# Patient Record
Sex: Female | Born: 1935 | Race: White | Hispanic: No | Marital: Married | Smoking: Never smoker
Health system: Southern US, Community
[De-identification: ages and names within clinical notes are randomized; demographics above are authoritative.]

## PROBLEM LIST (undated history)

## (undated) DIAGNOSIS — H3530 Unspecified macular degeneration: Secondary | ICD-10-CM

## (undated) DIAGNOSIS — I10 Essential (primary) hypertension: Secondary | ICD-10-CM

## (undated) DIAGNOSIS — I639 Cerebral infarction, unspecified: Secondary | ICD-10-CM

## (undated) DIAGNOSIS — M8580 Other specified disorders of bone density and structure, unspecified site: Secondary | ICD-10-CM

## (undated) HISTORY — DX: Essential (primary) hypertension: I10

## (undated) HISTORY — DX: Cerebral infarction, unspecified: I63.9

## (undated) HISTORY — DX: Other specified disorders of bone density and structure, unspecified site: M85.80

## (undated) HISTORY — PX: CATARACT EXTRACTION: SUR2

---

## 1997-04-23 ENCOUNTER — Encounter: Payer: Self-pay | Admitting: Internal Medicine

## 1998-04-16 ENCOUNTER — Ambulatory Visit (HOSPITAL_COMMUNITY): Admission: RE | Admit: 1998-04-16 | Discharge: 1998-04-16 | Payer: Self-pay | Admitting: *Deleted

## 1998-04-16 ENCOUNTER — Encounter: Payer: Self-pay | Admitting: *Deleted

## 1998-04-30 ENCOUNTER — Ambulatory Visit (HOSPITAL_COMMUNITY): Admission: RE | Admit: 1998-04-30 | Discharge: 1998-04-30 | Payer: Self-pay | Admitting: *Deleted

## 1998-05-14 ENCOUNTER — Ambulatory Visit (HOSPITAL_COMMUNITY): Admission: RE | Admit: 1998-05-14 | Discharge: 1998-05-14 | Payer: Self-pay | Admitting: *Deleted

## 1998-05-14 ENCOUNTER — Encounter: Payer: Self-pay | Admitting: *Deleted

## 1998-05-29 ENCOUNTER — Ambulatory Visit (HOSPITAL_COMMUNITY): Admission: RE | Admit: 1998-05-29 | Discharge: 1998-05-29 | Payer: Self-pay | Admitting: *Deleted

## 2002-01-18 ENCOUNTER — Encounter: Payer: Self-pay | Admitting: Internal Medicine

## 2004-04-17 ENCOUNTER — Encounter: Payer: Self-pay | Admitting: Internal Medicine

## 2004-09-11 ENCOUNTER — Ambulatory Visit: Payer: Self-pay | Admitting: Internal Medicine

## 2004-10-30 ENCOUNTER — Ambulatory Visit: Payer: Self-pay | Admitting: Internal Medicine

## 2004-12-11 ENCOUNTER — Ambulatory Visit: Payer: Self-pay | Admitting: Internal Medicine

## 2005-12-07 ENCOUNTER — Ambulatory Visit: Payer: Self-pay | Admitting: Internal Medicine

## 2005-12-23 ENCOUNTER — Encounter: Payer: Self-pay | Admitting: *Deleted

## 2006-03-05 ENCOUNTER — Ambulatory Visit: Payer: Self-pay | Admitting: Internal Medicine

## 2006-04-12 ENCOUNTER — Ambulatory Visit: Payer: Self-pay | Admitting: Internal Medicine

## 2006-05-26 ENCOUNTER — Encounter: Payer: Self-pay | Admitting: Internal Medicine

## 2006-06-03 ENCOUNTER — Ambulatory Visit: Payer: Self-pay | Admitting: Internal Medicine

## 2006-06-22 ENCOUNTER — Ambulatory Visit: Payer: Self-pay | Admitting: Internal Medicine

## 2006-06-22 DIAGNOSIS — I10 Essential (primary) hypertension: Secondary | ICD-10-CM | POA: Insufficient documentation

## 2006-06-22 DIAGNOSIS — M8580 Other specified disorders of bone density and structure, unspecified site: Secondary | ICD-10-CM

## 2006-06-22 DIAGNOSIS — M1711 Unilateral primary osteoarthritis, right knee: Secondary | ICD-10-CM | POA: Insufficient documentation

## 2006-06-22 DIAGNOSIS — R42 Dizziness and giddiness: Secondary | ICD-10-CM | POA: Insufficient documentation

## 2006-08-03 ENCOUNTER — Ambulatory Visit: Payer: Self-pay | Admitting: Internal Medicine

## 2006-12-14 ENCOUNTER — Ambulatory Visit: Payer: Self-pay | Admitting: Internal Medicine

## 2007-05-30 ENCOUNTER — Encounter: Payer: Self-pay | Admitting: Internal Medicine

## 2007-06-22 ENCOUNTER — Ambulatory Visit: Payer: Self-pay | Admitting: Internal Medicine

## 2007-06-24 LAB — CONVERTED CEMR LAB
BUN: 23 mg/dL (ref 6–23)
CO2: 32 meq/L (ref 19–32)
Calcium: 9.5 mg/dL (ref 8.4–10.5)
Chloride: 104 meq/L (ref 96–112)
Creatinine, Ser: 1.1 mg/dL (ref 0.4–1.2)
GFR calc Af Amer: 60 mL/min
GFR calc non Af Amer: 50 mL/min
Glucose, Bld: 112 mg/dL — ABNORMAL HIGH (ref 70–99)
Potassium: 3.7 meq/L (ref 3.5–5.1)
Sodium: 144 meq/L (ref 135–145)

## 2007-10-17 ENCOUNTER — Ambulatory Visit: Payer: Self-pay | Admitting: Internal Medicine

## 2007-11-14 ENCOUNTER — Ambulatory Visit: Payer: Self-pay | Admitting: Internal Medicine

## 2007-12-06 ENCOUNTER — Ambulatory Visit: Payer: Self-pay | Admitting: Internal Medicine

## 2007-12-22 ENCOUNTER — Ambulatory Visit: Payer: Self-pay | Admitting: Internal Medicine

## 2007-12-28 LAB — CONVERTED CEMR LAB
ALT: 18 units/L (ref 0–35)
AST: 21 units/L (ref 0–37)
Alkaline Phosphatase: 58 units/L (ref 39–117)
Basophils Absolute: 0 10*3/uL (ref 0.0–0.1)
Bilirubin, Direct: 0.2 mg/dL (ref 0.0–0.3)
CO2: 35 meq/L — ABNORMAL HIGH (ref 19–32)
Calcium: 9.3 mg/dL (ref 8.4–10.5)
Chloride: 100 meq/L (ref 96–112)
Glucose, Bld: 104 mg/dL — ABNORMAL HIGH (ref 70–99)
HDL: 54.6 mg/dL (ref >39.0)
Hemoglobin: 14.9 g/dL (ref 12.0–15.0)
Lymphocytes Relative: 59.3 % — ABNORMAL HIGH (ref 12.0–46.0)
Monocytes Relative: 7.4 % (ref 3.0–12.0)
Neutro Abs: 2.5 10*3/uL (ref 1.4–7.7)
Neutrophils Relative %: 30.9 % — ABNORMAL LOW (ref 43.0–77.0)
RBC: 4.84 M/uL (ref 3.87–5.11)
RDW: 13.2 % (ref 11.5–14.6)
Sodium: 140 meq/L (ref 135–145)
TSH: 1.89 microintl units/mL (ref 0.35–5.50)
Total Bilirubin: 1.1 mg/dL (ref 0.3–1.2)
Total CHOL/HDL Ratio: 3.9
Total Protein: 7 g/dL (ref 6.0–8.3)
VLDL: 23 mg/dL (ref 0–40)

## 2008-02-01 ENCOUNTER — Ambulatory Visit: Payer: Self-pay | Admitting: Internal Medicine

## 2008-02-01 LAB — CONVERTED CEMR LAB
Basophils Absolute: 0 10*3/uL (ref 0.0–0.1)
Eosinophils Absolute: 0.1 10*3/uL (ref 0.0–0.7)
HCT: 41.8 % (ref 36.0–46.0)
Hemoglobin: 14.7 g/dL (ref 12.0–15.0)
MCHC: 35.1 g/dL (ref 30.0–36.0)
MCV: 88.5 fL (ref 78.0–100.0)
Monocytes Absolute: 0.6 10*3/uL (ref 0.1–1.0)
Monocytes Relative: 6.5 % (ref 3.0–12.0)
Neutro Abs: 3.5 10*3/uL (ref 1.4–7.7)
Platelets: 158 10*3/uL (ref 150–400)
RDW: 13.3 % (ref 11.5–14.6)

## 2008-03-02 ENCOUNTER — Ambulatory Visit: Payer: Self-pay | Admitting: Internal Medicine

## 2008-03-05 LAB — CONVERTED CEMR LAB
Basophils Relative: 0.1 % (ref 0.0–1.0)
Eosinophils Absolute: 0.1 10*3/uL (ref 0.0–0.7)
Eosinophils Relative: 1.4 % (ref 0.0–5.0)
Lymphocytes Relative: 59.5 % — ABNORMAL HIGH (ref 12.0–46.0)
MCV: 90.7 fL (ref 78.0–100.0)
Neutrophils Relative %: 31.7 % — ABNORMAL LOW (ref 43.0–77.0)
Platelets: 162 10*3/uL (ref 150–400)
RBC: 4.86 M/uL (ref 3.87–5.11)
WBC: 10.6 10*3/uL — ABNORMAL HIGH (ref 4.5–10.5)

## 2008-03-06 ENCOUNTER — Other Ambulatory Visit: Admission: RE | Admit: 2008-03-06 | Discharge: 2008-03-06 | Payer: Self-pay | Admitting: Internal Medicine

## 2008-03-06 ENCOUNTER — Ambulatory Visit: Payer: Self-pay | Admitting: Internal Medicine

## 2008-03-06 ENCOUNTER — Encounter: Payer: Self-pay | Admitting: Internal Medicine

## 2008-03-09 ENCOUNTER — Telehealth: Payer: Self-pay | Admitting: Internal Medicine

## 2008-03-13 ENCOUNTER — Ambulatory Visit: Payer: Self-pay | Admitting: Internal Medicine

## 2008-03-14 ENCOUNTER — Encounter: Payer: Self-pay | Admitting: Internal Medicine

## 2008-03-14 LAB — CBC WITH DIFFERENTIAL/PLATELET
BASO%: 0.3 % (ref 0.0–2.0)
Eosinophils Absolute: 0.1 10*3/uL (ref 0.0–0.5)
HCT: 43.2 % (ref 34.8–46.6)
LYMPH%: 57.6 % — ABNORMAL HIGH (ref 14.0–48.0)
MONO#: 0.8 10*3/uL (ref 0.1–0.9)
NEUT#: 3.6 10*3/uL (ref 1.5–6.5)
NEUT%: 34 % — ABNORMAL LOW (ref 39.6–76.8)
Platelets: 153 10*3/uL (ref 145–400)
WBC: 10.6 10*3/uL — ABNORMAL HIGH (ref 3.9–10.0)
lymph#: 6.1 10*3/uL — ABNORMAL HIGH (ref 0.9–3.3)

## 2008-03-14 LAB — LACTATE DEHYDROGENASE: LDH: 168 U/L (ref 94–250)

## 2008-03-14 LAB — COMPREHENSIVE METABOLIC PANEL
ALT: 15 U/L (ref 0–35)
CO2: 28 mEq/L (ref 19–32)
Calcium: 9.6 mg/dL (ref 8.4–10.5)
Chloride: 103 mEq/L (ref 96–112)
Creatinine, Ser: 0.86 mg/dL (ref 0.40–1.20)
Glucose, Bld: 102 mg/dL — ABNORMAL HIGH (ref 70–99)
Sodium: 142 mEq/L (ref 135–145)
Total Bilirubin: 0.8 mg/dL (ref 0.3–1.2)
Total Protein: 7.1 g/dL (ref 6.0–8.3)

## 2008-05-02 ENCOUNTER — Ambulatory Visit: Payer: Self-pay | Admitting: Internal Medicine

## 2008-05-25 ENCOUNTER — Ambulatory Visit: Payer: Self-pay | Admitting: Internal Medicine

## 2008-05-30 ENCOUNTER — Encounter: Payer: Self-pay | Admitting: Internal Medicine

## 2008-06-12 ENCOUNTER — Ambulatory Visit: Payer: Self-pay | Admitting: Internal Medicine

## 2008-06-14 ENCOUNTER — Other Ambulatory Visit: Admission: RE | Admit: 2008-06-14 | Discharge: 2008-06-14 | Payer: Self-pay | Admitting: Internal Medicine

## 2008-06-14 ENCOUNTER — Encounter: Payer: Self-pay | Admitting: Internal Medicine

## 2008-06-14 LAB — CBC WITH DIFFERENTIAL/PLATELET
Basophils Absolute: 0 10*3/uL (ref 0.0–0.1)
EOS%: 1.4 % (ref 0.0–7.0)
HCT: 42.5 % (ref 34.8–46.6)
HGB: 14.4 g/dL (ref 11.6–15.9)
LYMPH%: 58.9 % — ABNORMAL HIGH (ref 14.0–48.0)
MCH: 30.3 pg (ref 26.0–34.0)
MCV: 89.4 fL (ref 81.0–101.0)
MONO%: 8.3 % (ref 0.0–13.0)
NEUT%: 31 % — ABNORMAL LOW (ref 39.6–76.8)
Platelets: 158 10*3/uL (ref 145–400)

## 2008-06-14 LAB — COMPREHENSIVE METABOLIC PANEL
Alkaline Phosphatase: 62 U/L (ref 39–117)
BUN: 30 mg/dL — ABNORMAL HIGH (ref 6–23)
Glucose, Bld: 132 mg/dL — ABNORMAL HIGH (ref 70–99)
Total Bilirubin: 0.7 mg/dL (ref 0.3–1.2)

## 2008-06-14 LAB — LACTATE DEHYDROGENASE: LDH: 175 U/L (ref 94–250)

## 2008-12-11 ENCOUNTER — Ambulatory Visit: Payer: Self-pay | Admitting: Internal Medicine

## 2008-12-13 ENCOUNTER — Encounter: Payer: Self-pay | Admitting: Internal Medicine

## 2008-12-13 LAB — CBC WITH DIFFERENTIAL/PLATELET
BASO%: 0.5 % (ref 0.0–2.0)
Basophils Absolute: 0 10e3/uL (ref 0.0–0.1)
EOS%: 1.3 % (ref 0.0–7.0)
Eosinophils Absolute: 0.1 10e3/uL (ref 0.0–0.5)
HCT: 43.9 % (ref 34.8–46.6)
HGB: 14.8 g/dL (ref 11.6–15.9)
LYMPH%: 54 % — ABNORMAL HIGH (ref 14.0–49.7)
MCH: 30.3 pg (ref 25.1–34.0)
MCHC: 33.8 g/dL (ref 31.5–36.0)
MCV: 89.6 fL (ref 79.5–101.0)
MONO#: 0.8 10e3/uL (ref 0.1–0.9)
MONO%: 7.9 % (ref 0.0–14.0)
NEUT#: 3.6 10e3/uL (ref 1.5–6.5)
NEUT%: 36.3 % — ABNORMAL LOW (ref 38.4–76.8)
Platelets: 157 10e3/uL (ref 145–400)
RBC: 4.9 10e6/uL (ref 3.70–5.45)
RDW: 14.5 % (ref 11.2–14.5)
WBC: 9.9 10e3/uL (ref 3.9–10.3)
lymph#: 5.4 10e3/uL — ABNORMAL HIGH (ref 0.9–3.3)

## 2008-12-20 ENCOUNTER — Ambulatory Visit: Payer: Self-pay | Admitting: Internal Medicine

## 2009-01-22 ENCOUNTER — Telehealth: Payer: Self-pay | Admitting: Internal Medicine

## 2009-01-22 ENCOUNTER — Ambulatory Visit: Payer: Self-pay | Admitting: Internal Medicine

## 2009-04-24 ENCOUNTER — Ambulatory Visit: Payer: Self-pay | Admitting: Internal Medicine

## 2009-04-25 LAB — CONVERTED CEMR LAB
BUN: 24 mg/dL — ABNORMAL HIGH (ref 6–23)
Basophils Relative: 0.2 % (ref 0.0–3.0)
CO2: 33 meq/L — ABNORMAL HIGH (ref 19–32)
Calcium: 9.6 mg/dL (ref 8.4–10.5)
Chloride: 105 meq/L (ref 96–112)
Creatinine, Ser: 0.8 mg/dL (ref 0.4–1.2)
Direct LDL: 125.1 mg/dL
Eosinophils Absolute: 0.1 10*3/uL (ref 0.0–0.7)
HCT: 42.3 % (ref 36.0–46.0)
Hemoglobin: 14.6 g/dL (ref 12.0–15.0)
Lymphocytes Relative: 53 % — ABNORMAL HIGH (ref 12.0–46.0)
Lymphs Abs: 4.5 10*3/uL — ABNORMAL HIGH (ref 0.7–4.0)
MCHC: 34.5 g/dL (ref 30.0–36.0)
Monocytes Relative: 9 % (ref 3.0–12.0)
Neutro Abs: 3 10*3/uL (ref 1.4–7.7)
RBC: 4.62 M/uL (ref 3.87–5.11)
Total CHOL/HDL Ratio: 4

## 2009-05-02 ENCOUNTER — Encounter: Payer: Self-pay | Admitting: Internal Medicine

## 2009-05-02 ENCOUNTER — Ambulatory Visit: Payer: Self-pay | Admitting: Internal Medicine

## 2009-05-02 LAB — HM DEXA SCAN

## 2009-06-04 ENCOUNTER — Ambulatory Visit: Payer: Self-pay | Admitting: Internal Medicine

## 2009-06-05 ENCOUNTER — Encounter (INDEPENDENT_AMBULATORY_CARE_PROVIDER_SITE_OTHER): Payer: Self-pay | Admitting: *Deleted

## 2009-06-10 ENCOUNTER — Ambulatory Visit: Payer: Self-pay | Admitting: Internal Medicine

## 2009-06-12 ENCOUNTER — Encounter (INDEPENDENT_AMBULATORY_CARE_PROVIDER_SITE_OTHER): Payer: Self-pay | Admitting: *Deleted

## 2009-06-12 LAB — CBC WITH DIFFERENTIAL/PLATELET
Basophils Absolute: 0 10*3/uL (ref 0.0–0.1)
Eosinophils Absolute: 0.1 10*3/uL (ref 0.0–0.5)
HGB: 14.3 g/dL (ref 11.6–15.9)
LYMPH%: 53.2 % — ABNORMAL HIGH (ref 14.0–49.7)
MCV: 90.6 fL (ref 79.5–101.0)
MONO%: 8 % (ref 0.0–14.0)
NEUT#: 2.7 10*3/uL (ref 1.5–6.5)
Platelets: 156 10*3/uL (ref 145–400)

## 2009-07-31 ENCOUNTER — Ambulatory Visit: Payer: Self-pay | Admitting: Internal Medicine

## 2009-08-07 ENCOUNTER — Telehealth: Payer: Self-pay | Admitting: Internal Medicine

## 2009-09-02 ENCOUNTER — Ambulatory Visit: Payer: Self-pay | Admitting: Internal Medicine

## 2009-10-28 ENCOUNTER — Ambulatory Visit: Payer: Self-pay | Admitting: Internal Medicine

## 2009-10-29 LAB — CONVERTED CEMR LAB
Chloride: 106 meq/L (ref 96–112)
Potassium: 4.4 meq/L (ref 3.5–5.1)
Sodium: 143 meq/L (ref 135–145)

## 2009-12-13 ENCOUNTER — Ambulatory Visit: Payer: Self-pay | Admitting: Internal Medicine

## 2009-12-16 ENCOUNTER — Encounter: Payer: Self-pay | Admitting: Internal Medicine

## 2009-12-16 LAB — CBC WITH DIFFERENTIAL/PLATELET
Basophils Absolute: 0 10*3/uL (ref 0.0–0.1)
EOS%: 1.3 % (ref 0.0–7.0)
HCT: 41.2 % (ref 34.8–46.6)
HGB: 14 g/dL (ref 11.6–15.9)
LYMPH%: 29 % (ref 14.0–49.7)
MCH: 31.2 pg (ref 25.1–34.0)
MCV: 91.6 fL (ref 79.5–101.0)
NEUT%: 62 % (ref 38.4–76.8)
Platelets: 163 10*3/uL (ref 145–400)
lymph#: 2.5 10*3/uL (ref 0.9–3.3)

## 2010-01-01 ENCOUNTER — Ambulatory Visit: Payer: Self-pay | Admitting: Internal Medicine

## 2010-05-05 ENCOUNTER — Ambulatory Visit: Payer: Self-pay | Admitting: Internal Medicine

## 2010-05-05 LAB — CONVERTED CEMR LAB
CO2: 28 meq/L (ref 19–32)
Chloride: 105 meq/L (ref 96–112)
GFR calc non Af Amer: 64.93 mL/min (ref >60)
Glucose, Bld: 103 mg/dL — ABNORMAL HIGH (ref 70–99)
Potassium: 3.8 meq/L (ref 3.5–5.1)
Sodium: 142 meq/L (ref 135–145)

## 2010-06-09 ENCOUNTER — Encounter: Payer: Self-pay | Admitting: Internal Medicine

## 2010-06-11 ENCOUNTER — Ambulatory Visit: Payer: Self-pay | Admitting: Internal Medicine

## 2010-06-11 DIAGNOSIS — M4802 Spinal stenosis, cervical region: Secondary | ICD-10-CM | POA: Insufficient documentation

## 2010-06-13 ENCOUNTER — Telehealth: Payer: Self-pay | Admitting: Internal Medicine

## 2010-06-16 ENCOUNTER — Ambulatory Visit: Payer: Self-pay | Admitting: Internal Medicine

## 2010-06-23 ENCOUNTER — Encounter: Admission: RE | Admit: 2010-06-23 | Discharge: 2010-06-23 | Payer: Self-pay | Admitting: Internal Medicine

## 2010-06-27 ENCOUNTER — Ambulatory Visit: Payer: Self-pay | Admitting: Internal Medicine

## 2010-06-30 ENCOUNTER — Encounter: Payer: Self-pay | Admitting: Internal Medicine

## 2010-07-09 ENCOUNTER — Ambulatory Visit: Payer: Self-pay | Admitting: Internal Medicine

## 2010-07-10 ENCOUNTER — Encounter: Admission: RE | Admit: 2010-07-10 | Discharge: 2010-07-10 | Payer: Self-pay | Admitting: Neurosurgery

## 2010-07-22 ENCOUNTER — Encounter: Admission: RE | Admit: 2010-07-22 | Discharge: 2010-07-22 | Payer: Self-pay | Admitting: Neurosurgery

## 2010-08-06 ENCOUNTER — Ambulatory Visit: Payer: Self-pay | Admitting: Internal Medicine

## 2010-08-14 ENCOUNTER — Telehealth: Payer: Self-pay | Admitting: Internal Medicine

## 2010-08-20 ENCOUNTER — Encounter
Admission: RE | Admit: 2010-08-20 | Discharge: 2010-08-20 | Payer: Self-pay | Source: Home / Self Care | Attending: Neurosurgery | Admitting: Neurosurgery

## 2010-09-17 ENCOUNTER — Ambulatory Visit
Admission: RE | Admit: 2010-09-17 | Discharge: 2010-09-17 | Payer: Self-pay | Source: Home / Self Care | Attending: Internal Medicine | Admitting: Internal Medicine

## 2010-09-25 NOTE — Progress Notes (Signed)
----   Converted from flag ---- ---- 08/06/2010 10:30 AM, Birdie Sons MD wrote: Call Dr. Cassandria Santee nurse: see if he should f/u with her. Steroid injections have helped. ------------------------------  Spoke with Dr Cassandria Santee secretary and she said that pt needs to call [facility] and have them fax order.  Called pt and gave her information and she was confused so I just told her to call Dr Cassandria Santee office.

## 2010-09-25 NOTE — Assessment & Plan Note (Signed)
Summary: discuss MRI results/cdw   Vital Signs:  Patient profile:   77 year old female Weight:      141 pounds Temp:     98.1 degrees F oral Pulse rate:   88 / minute Pulse rhythm:   regular BP sitting:   134 / 82  (left arm) Cuff size:   regular CC: discuss MRI results   CC:  discuss MRI results.  Current Medications (verified): 1)  Amlodipine Besylate 5 Mg  Tabs (Amlodipine Besylate) .Marland Kitchen.. 1 By Mouth Every Day 2)  Fish Oil 1000 Mg Caps (Omega-3 Fatty Acids) 3)  Losartan Potassium-Hctz 100-25 Mg Tabs (Losartan Potassium-Hctz) .... Take 1 Tablet By Mouth Once A Day 4)  Multivitamins  Tabs (Multiple Vitamin) .... Once Daily 5)  Labetalol Hcl 200 Mg Tabs (Labetalol Hcl) .Marland Kitchen.. 1 Tab Twice Daily 6)  Hydrocodone-Acetaminophen 5-325 Mg Tabs (Hydrocodone-Acetaminophen) .Marland Kitchen.. 1 By Mouth Up To 4 Times Per Day As Needed For Pain  Allergies (verified): 1)  ! Ace Inhibitors   Impression & Recommendations:  Problem # 1:  SPINAL STENOSIS, CERVICAL (ICD-723.0) I had a long discussion with the patient and her husband today. She has significant spinal stenosis. I spoke with interventional radiology. I suggested neurosurgery consult rather than immediate epidural steroid injection. We will do that. I'll set up neurosurgery evaluation. Discussion 30 minutes over half face-to-face talking with patient and husband.  Complete Medication List: 1)  Amlodipine Besylate 5 Mg Tabs (Amlodipine besylate) .Marland Kitchen.. 1 by mouth every day 2)  Fish Oil 1000 Mg Caps (Omega-3 fatty acids) 3)  Losartan Potassium-hctz 100-25 Mg Tabs (Losartan potassium-hctz) .... Take 1 tablet by mouth once a day 4)  Multivitamins Tabs (Multiple vitamin) .... Once daily 5)  Labetalol Hcl 200 Mg Tabs (Labetalol hcl) .Marland Kitchen.. 1 tab twice daily 6)  Hydrocodone-acetaminophen 5-325 Mg Tabs (Hydrocodone-acetaminophen) .Marland Kitchen.. 1 by mouth up to 4 times per day as needed for pain Prescriptions: HYDROCODONE-ACETAMINOPHEN 5-325 MG TABS  (HYDROCODONE-ACETAMINOPHEN) 1 by mouth up to 4 times per day as needed for pain  #60 x 1   Entered and Authorized by:   Birdie Sons MD   Signed by:   Birdie Sons MD on 06/27/2010   Method used:   Print then Give to Patient   RxID:   [id number] AMLODIPINE BESYLATE 5 MG  TABS (AMLODIPINE BESYLATE) 1 by mouth every day  #90 x 3   Entered and Authorized by:   Birdie Sons MD   Signed by:   Birdie Sons MD on 06/27/2010   Method used:   [facility] to        [phone]       [address]       [place], [place]  [address]       Ph: [phone] or [phone]       Fax: [phone]   RxID:   [id number]    Orders Added: 1)  Est. Patient Level IV [33295]  Appended Document: Orders Update    Clinical Lists Changes  Orders: Added new Referral order of Neurosurgeon Referral (Neurosurgeon) - Signed

## 2010-09-25 NOTE — Assessment & Plan Note (Signed)
Summary: ONE MTH ROV // RS   Vital Signs:  Patient profile:   77 year old female Weight:      148 pounds Temp:     98.2 degrees F Pulse rate:   68 / minute Resp:     12 per minute BP sitting:   136 / 74  (left arm)  Vitals Entered By: Gladis Riffle, RN (September 02, 2009 11:03 AM)   History of Present Illness:  Follow-Up Visit      This is an 77 year old woman who presents for Follow-up visit.  The patient denies chest pain, palpitations, dizziness, syncope, edema, SOB, DOE, PND, and orthopnea.  Since the last visit the patient notes no new problems or concerns.  The patient reports taking meds as prescribed and monitoring BP.  When questioned about possible medication side effects, the patient notes none.    past week her home bps have been in 125-135/70 range  All other systems reviewed and were negative   Preventive Screening-Counseling & Management  Alcohol-Tobacco     Smoking Status: never  Allergies: 1)  ! Ace Inhibitors  Comments:  Nurse/Medical Assistant: 1 month rov--BP 116/75-153/76 at home  The patient's medications and allergies were reviewed with the patient and were updated in the Medication and Allergy Lists. Gladis Riffle, RN (September 02, 2009 11:03 AM)  Past History:  Past Medical History: Last updated: 06/22/2006 Hypertension Osteopenia  Past Surgical History: Last updated: 12/20/2008  Cataract extraction  Family History: Last updated: 06/22/2007 Family Hsitory Breast cancer 1st degree relative <50-dtr  Social History: Last updated: 06/22/2006 Married Never Smoked  Risk Factors: Smoking Status: never (09/02/2009)  Review of Systems       All other systems reviewed and were negative   Physical Exam  General:  Well-developed,well-nourished,in no acute distress; alert,appropriate and cooperative throughout examination Neck:  No deformities, masses, or tenderness noted. Chest Wall:  No deformities, masses, or tenderness noted. Lungs:   Normal respiratory effort, chest expands symmetrically. Lungs are clear to auscultation, no crackles or wheezes. Heart:  Normal rate and regular rhythm. S1 and S2 normal without gallop, murmur, click, rub or other extra sounds.   Impression & Recommendations:  Problem # 1:  HYPERTENSION (ICD-401.9) Assessment Improved improved continue current medications  Her updated medication list for this problem includes:    Amlodipine Besylate 5 Mg Tabs (Amlodipine besylate) .Marland Kitchen... 1 by mouth every day    Losartan Potassium-hctz 100-25 Mg Tabs (Losartan potassium-hctz) .Marland Kitchen... Take 1 tablet by mouth once a day    Labetalol Hcl 200 Mg Tabs (Labetalol hcl) .Marland Kitchen... 1 tab twice daily  BP today: 136/74 Prior BP: 158/78 (07/31/2009)  Prior 10 Yr Risk Heart Disease: Not enough information (08/03/2006)  Labs Reviewed: K+: 4.5 (04/24/2009) Creat: : 0.8 (04/24/2009)   Chol: 203 (04/24/2009)   HDL: 55.40 (04/24/2009)   LDL: DEL (12/22/2007)   TG: 133.0 (04/24/2009)  Complete Medication List: 1)  Amlodipine Besylate 5 Mg Tabs (Amlodipine besylate) .Marland Kitchen.. 1 by mouth every day 2)  Tylenol 325 Mg Tabs (Acetaminophen) .... Once daily as needed 3)  Fish Oil 1000 Mg Caps (Omega-3 fatty acids) 4)  Losartan Potassium-hctz 100-25 Mg Tabs (Losartan potassium-hctz) .... Take 1 tablet by mouth once a day 5)  Multivitamins Tabs (Multiple vitamin) .... Once daily 6)  Labetalol Hcl 200 Mg Tabs (Labetalol hcl) .Marland Kitchen.. 1 tab twice daily  Patient Instructions: 1)  Please schedule a follow-up appointment in 2 months.

## 2010-09-25 NOTE — Assessment & Plan Note (Signed)
Summary: 4 week fup//ccm   Vital Signs:  Patient profile:   77 year old female Weight:      140 pounds Temp:     98.4 degrees F oral BP sitting:   122 / 66  (left arm) Cuff size:   regular  Vitals Entered By: Alfred Levins, CMA (August 06, 2010 9:48 AM) CC: f/u   CC:  f/u.  History of Present Illness: neck and shoulder pain much better--- has had epidural cervical ESI  she complains of an intentional tremor---has changed the way she writes her name  All other systems reviewed and were negative except chronic aches and pains   Current Medications (verified): 1)  Amlodipine Besylate 5 Mg  Tabs (Amlodipine Besylate) .Marland Kitchen.. 1 By Mouth Every Day 2)  Fish Oil 1000 Mg Caps (Omega-3 Fatty Acids) 3)  Losartan Potassium-Hctz 100-25 Mg Tabs (Losartan Potassium-Hctz) .... Take 1 Tablet By Mouth Once A Day 4)  Multivitamins  Tabs (Multiple Vitamin) .... Once Daily 5)  Labetalol Hcl 200 Mg Tabs (Labetalol Hcl) .Marland Kitchen.. 1 Tab Twice Daily 6)  Hydrocodone-Acetaminophen 5-325 Mg Tabs (Hydrocodone-Acetaminophen) .Marland Kitchen.. 1 By Mouth Up To 4 Times Per Day As Needed For Pain  Allergies (verified): 1)  ! Ace Inhibitors  Physical Exam  General:  early female in no acute distress. HEENT exam atraumatic, normocephalic, neck supple. Chest clear to auscultation cardiac exam S1-S2 are regular   Impression & Recommendations:  Problem # 1:  SPINAL STENOSIS, CERVICAL (ICD-723.0) much improved I'll call Dr. Levada Dy if f/u necessary  Problem # 2:  HYPERTENSION (ICD-401.9) continue current medications  monitor BP at home Her updated medication list for this problem includes:    Amlodipine Besylate 5 Mg Tabs (Amlodipine besylate) .Marland Kitchen... 1 by mouth every day    Losartan Potassium-hctz 100-25 Mg Tabs (Losartan potassium-hctz) .Marland Kitchen... Take 1 tablet by mouth once a day    Labetalol Hcl 200 Mg Tabs (Labetalol hcl) .Marland Kitchen... 1 tab twice daily  Complete Medication List: 1)  Amlodipine Besylate 5 Mg Tabs  (Amlodipine besylate) .Marland Kitchen.. 1 by mouth every day 2)  Fish Oil 1000 Mg Caps (Omega-3 fatty acids) 3)  Losartan Potassium-hctz 100-25 Mg Tabs (Losartan potassium-hctz) .... Take 1 tablet by mouth once a day 4)  Multivitamins Tabs (Multiple vitamin) .... Once daily 5)  Labetalol Hcl 200 Mg Tabs (Labetalol hcl) .Marland Kitchen.. 1 tab twice daily 6)  Hydrocodone-acetaminophen 5-325 Mg Tabs (Hydrocodone-acetaminophen) .Marland Kitchen.. 1 by mouth up to 4 times per day as needed for pain  Patient Instructions: 1)  see me 6 weeks   Orders Added: 1)  Est. Patient Level III [04540]

## 2010-09-25 NOTE — Progress Notes (Signed)
Summary: pain killing her  Phone Note Call from Patient   Caller: Patient Call For: Birdie Sons MD Summary of Call: Shoulder is 'a little" better since injection.  Should she do anyting else for her pain? Initial call taken by: [facility] CMA AAMA,  June 13, 2010 12:12 PM  Follow-up for Phone Call        let's see what happens over the next few days. Call me next week if symptoms persist. Follow-up by: Birdie Sons MD,  June 14, 2010 5:08 PM  Additional Follow-up for Phone Call Additional follow up Details #1::        Pain in right arm is killing her & she's going for the xray you advised today at [facility].   Additional Follow-up by: Rudy Jew, RN,  June 16, 2010 8:48 AM

## 2010-09-25 NOTE — Letter (Signed)
[facility]   Imported By: Maryln Gottron 01/14/2010 13:45:14  _____________________________________________________________________  External Attachment:    Type:   Image     Comment:   External Document

## 2010-09-25 NOTE — Assessment & Plan Note (Signed)
Summary: pt confused about ref/cdw   Vital Signs:  Patient profile:   77 year old female Weight:      141 pounds Temp:     98.5 degrees F oral BP sitting:   124 / 68  (left arm) Cuff size:   regular  Vitals Entered By: Alfred Levins, CMA (July 09, 2010 10:01 AM) CC: discuss referral   CC:  discuss referral.  Current Medications (verified): 1)  Amlodipine Besylate 5 Mg  Tabs (Amlodipine Besylate) .Marland Kitchen.. 1 By Mouth Every Day 2)  Fish Oil 1000 Mg Caps (Omega-3 Fatty Acids) 3)  Losartan Potassium-Hctz 100-25 Mg Tabs (Losartan Potassium-Hctz) .... Take 1 Tablet By Mouth Once A Day 4)  Multivitamins  Tabs (Multiple Vitamin) .... Once Daily 5)  Labetalol Hcl 200 Mg Tabs (Labetalol Hcl) .Marland Kitchen.. 1 Tab Twice Daily 6)  Hydrocodone-Acetaminophen 5-325 Mg Tabs (Hydrocodone-Acetaminophen) .Marland Kitchen.. 1 By Mouth Up To 4 Times Per Day As Needed For Pain  Allergies (verified): 1)  ! Ace Inhibitors   Impression & Recommendations:  Problem # 1:  SPINAL STENOSIS, CERVICAL (ICD-723.0) scheduled for injections tomorrow. to pursue injections by interventional radiology. Patient and wife understand. Total time face-to-face 15 minutes Patient comes in with husband to discuss recent MRI, neurosurgical referral in interventional radiology referral. I have discussed the results of the MRI and neurosurgical impression. Best choice at this time is pursue injections by interventional radiology. This was discussed with patient and husband. Total time face-to-face greater than 15 minutes all counseling.  Complete Medication List: 1)  Amlodipine Besylate 5 Mg Tabs (Amlodipine besylate) .Marland Kitchen.. 1 by mouth every day 2)  Fish Oil 1000 Mg Caps (Omega-3 fatty acids) 3)  Losartan Potassium-hctz 100-25 Mg Tabs (Losartan potassium-hctz) .... Take 1 tablet by mouth once a day 4)  Multivitamins Tabs (Multiple vitamin) .... Once daily 5)  Labetalol Hcl 200 Mg Tabs (Labetalol hcl) .Marland Kitchen.. 1 tab twice daily 6)  Hydrocodone-acetaminophen  5-325 Mg Tabs (Hydrocodone-acetaminophen) .Marland Kitchen.. 1 by mouth up to 4 times per day as needed for pain   Orders Added: 1)  Est. Patient Level III [47829]

## 2010-09-25 NOTE — Assessment & Plan Note (Signed)
Summary: 6 MONTH ROV/NJR   Vital Signs:  Patient profile:   77 year old female Weight:      143 pounds Temp:     98.7 degrees F oral BP sitting:   138 / 78  (left arm) Cuff size:   regular  Vitals Entered By: Kern Reap CMA Duncan Dull) (May 05, 2010 11:04 AM) CC: follow-up visit, arthritis   CC:  follow-up visit and arthritis.  History of Present Illness:  Follow-Up Visit      This is a 77 year old woman who presents for Follow-up visit.  The patient denies chest pain and palpitations.  Since the last visit the patient notes no new problems or concerns , except one week hx of right shoulder pain---some relief with hot shower and Crista Elliot.   The patient reports taking meds as prescribed.  When questioned about possible medication side effects, the patient notes none.    no CP, SOB< PND, orhtopnea or any other complaints.   All other systems reviewed and were negative   Current Problems (verified): 1)  Preventive Health Care  (ICD-V70.0) 2)  Arthritis, Right Knee  (ICD-716.96) 3)  Vertigo  (ICD-780.4) 4)  Osteopenia  (ICD-733.90) 5)  Hypertension  (ICD-401.9)  Current Medications (verified): 1)  Amlodipine Besylate 5 Mg  Tabs (Amlodipine Besylate) .Marland Kitchen.. 1 By Mouth Every Day 2)  Tylenol Extra Strength 500 Mg Tabs (Acetaminophen) .... Take One Qid X 5 Days, Then May Return To As Needed 3)  Fish Oil 1000 Mg Caps (Omega-3 Fatty Acids) 4)  Losartan Potassium-Hctz 100-25 Mg Tabs (Losartan Potassium-Hctz) .... Take 1 Tablet By Mouth Once A Day 5)  Multivitamins  Tabs (Multiple Vitamin) .... Once Daily 6)  Labetalol Hcl 200 Mg Tabs (Labetalol Hcl) .Marland Kitchen.. 1 Tab Twice Daily  Allergies (verified): 1)  ! Ace Inhibitors  Review of Systems       Flu Vaccine Consent Questions     Do you have a history of severe allergic reactions to this vaccine? no    Any prior history of allergic reactions to egg and/or gelatin? no    Do you have a sensitivity to the preservative Thimersol? no  Do you have a past history of Guillan-Barre Syndrome? no    Do you currently have an acute febrile illness? no    Have you ever had a severe reaction to latex? no    Vaccine information given and explained to patient? yes    Are you currently pregnant? no    Lot Number:[id number]   Exp Date:02/21/2011   Site Given  Left Deltoid IM    Impression & Recommendations:  Problem # 1:  HYPERTENSION (ICD-401.9)  Her updated medication list for this problem includes:    Amlodipine Besylate 5 Mg Tabs (Amlodipine besylate) .Marland Kitchen... 1 by mouth every day    Losartan Potassium-hctz 100-25 Mg Tabs (Losartan potassium-hctz) .Marland Kitchen... Take 1 tablet by mouth once a day    Labetalol Hcl 200 Mg Tabs (Labetalol hcl) .Marland Kitchen... 1 tab twice daily  BP today: 138/78 Prior BP: 118/74 (01/01/2010)  Prior 10 Yr Risk Heart Disease: Not enough information (08/03/2006)  Labs Reviewed: K+: 4.4 (10/28/2009) Creat: : 1.1 (10/28/2009)   Chol: 203 (04/24/2009)   HDL: 55.40 (04/24/2009)   LDL: DEL (12/22/2007)   TG: 133.0 (04/24/2009)  Orders: Venipuncture (19147) TLB-BMP (Basic Metabolic Panel-BMET) (80048-METABOL)  Problem # 2:  ARTHRITIS, RIGHT KNEE (ICD-716.96) reasonable control and not on any meds  Problem # 3:  SHOULDER PAIN, RIGHT (ICD-719.41) I  think this is more radicular pain from nerve compression discussed meds side effects discussed Her updated medication list for this problem includes:    Tylenol Extra Strength 500 Mg Tabs (Acetaminophen) .Marland Kitchen... Take one qid x 5 days, then may return to as needed    Meloxicam 15 Mg Tabs (Meloxicam) ..... One by mouth daily with food  Complete Medication List: 1)  Amlodipine Besylate 5 Mg Tabs (Amlodipine besylate) .Marland Kitchen.. 1 by mouth every day 2)  Tylenol Extra Strength 500 Mg Tabs (Acetaminophen) .... Take one qid x 5 days, then may return to as needed 3)  Fish Oil 1000 Mg Caps (Omega-3 fatty acids) 4)  Losartan Potassium-hctz 100-25 Mg Tabs (Losartan potassium-hctz) ....  Take 1 tablet by mouth once a day 5)  Multivitamins Tabs (Multiple vitamin) .... Once daily 6)  Labetalol Hcl 200 Mg Tabs (Labetalol hcl) .Marland Kitchen.. 1 tab twice daily 7)  Meloxicam 15 Mg Tabs (Meloxicam) .... One by mouth daily with food  Other Orders: Admin 1st Vaccine (78295) Flu Vaccine 32yrs + (62130)  Patient Instructions: 1)  Please schedule a follow-up appointment in 4 months. Prescriptions: MELOXICAM 15 MG TABS (MELOXICAM) one by mouth daily with food  #30 x 0   Entered and Authorized by:   Birdie Sons MD   Signed by:   Birdie Sons MD on 05/05/2010   Method used:   [facility]        [phone]ail[facility]       [address]       [place], [place]  [address]       Ph: [phone] or [phone]       Fax: [phone]   RxID:   [id number]   Appended Document: Orders Update    Clinical Lists Changes  Orders: Added new Service order of Specimen Handling (84166) - Signed

## 2010-09-25 NOTE — Letter (Signed)
Summary: [facility]  [facility]   Imported By: Maryln Gottron 07/11/2010 11:26:35  _____________________________________________________________________  External Attachment:    Type:   Image     Comment:   External Document

## 2010-09-25 NOTE — Assessment & Plan Note (Signed)
Summary: F/U ON INJURIES FROM FALL (PT FELL LAST 05/02 -EXP PAIN FROM .Marland KitchenMarland Kitchen   Vital Signs:  Patient profile:   77 year old female Weight:      143 pounds BMI:     28.22 Temp:     98.7 degrees F oral Pulse rate:   62 / minute Pulse rhythm:   regular Resp:     12 per minute BP sitting:   118 / 74  (left arm) Cuff size:   regular  Vitals Entered By: Gladis Riffle, RN (Jan 01, 2010 12:19 PM) CC: continues to have dull headache since fell 12/23/09 when hit head against shower door Is Patient Diabetic? No Pain Assessment Patient in pain? yes     Location: head Intensity: 1 Type: head not clear   CC:  continues to have dull headache since fell 12/23/09 when hit head against shower door.  History of Present Illness: fell one week ago hit head on shower door no LOC has had residual, minor headache she feels somewhat "foggy" no specific neurologic deficit  All other systems reviewed and were negative   Preventive Screening-Counseling & Management  Alcohol-Tobacco     Smoking Status: never  Current Problems (verified): 1)  Preventive Health Care  (ICD-V70.0) 2)  Arthritis, Right Knee  (ICD-716.96) 3)  Vertigo  (ICD-780.4) 4)  Osteopenia  (ICD-733.90) 5)  Hypertension  (ICD-401.9)  Current Medications (verified): 1)  Amlodipine Besylate 5 Mg  Tabs (Amlodipine Besylate) .Marland Kitchen.. 1 By Mouth Every Day 2)  Tylenol 325 Mg  Tabs (Acetaminophen) .... Once Daily As Needed 3)  Fish Oil 1000 Mg Caps (Omega-3 Fatty Acids) 4)  Losartan Potassium-Hctz 100-25 Mg Tabs (Losartan Potassium-Hctz) .... Take 1 Tablet By Mouth Once A Day 5)  Multivitamins  Tabs (Multiple Vitamin) .... Once Daily 6)  Labetalol Hcl 200 Mg Tabs (Labetalol Hcl) .Marland Kitchen.. 1 Tab Twice Daily  Allergies: 1)  ! Ace Inhibitors  Past History:  Past Medical History: Last updated: 06/22/2006 Hypertension Osteopenia  Past Surgical History: Last updated: 12/20/2008  Cataract extraction  Family History: Last updated:  06/22/2007 Family Hsitory Breast cancer 1st degree relative <50-dtr  Social History: Last updated: 06/22/2006 Married Never Smoked  Risk Factors: Smoking Status: never (01/01/2010)  Review of Systems       All other systems reviewed and were negative   Physical Exam  General:  Well-developed,well-nourished,in no acute distress; alert,appropriate and cooperative throughout examination Head:  normocephalic and atraumatic.   Eyes:  pupils equal and pupils round.  EOM intact Ears:  R ear normal and L ear normal.   Neck:  No deformities, masses, or tenderness noted. Chest Wall:  No deformities, masses, or tenderness noted. Heart:  normal rate and regular rhythm.   Abdomen:  Bowel sounds positive,abdomen soft and non-tender without masses, organomegaly or hernias noted. Msk:  No deformity or scoliosis noted of thoracic or lumbar spine.   Neurologic:  cranial nerves II-XII intact and gait normal.     Impression & Recommendations:  Problem # 1:  HEADACHE (ICD-784.0)  after trauma has some vague neurologic complaints CT head Her updated medication list for this problem includes:    Tylenol Extra Strength 500 Mg Tabs (Acetaminophen) .Marland Kitchen... Take one qid x 5 days, then may return to as needed    Labetalol Hcl 200 Mg Tabs (Labetalol hcl) .Marland Kitchen... 1 tab twice daily  Orders: Radiology Referral (Radiology)  Complete Medication List: 1)  Amlodipine Besylate 5 Mg Tabs (Amlodipine besylate) .Marland Kitchen.. 1 by mouth every day  2)  Tylenol Extra Strength 500 Mg Tabs (Acetaminophen) .... Take one qid x 5 days, then may return to as needed 3)  Fish Oil 1000 Mg Caps (Omega-3 fatty acids) 4)  Losartan Potassium-hctz 100-25 Mg Tabs (Losartan potassium-hctz) .... Take 1 tablet by mouth once a day 5)  Multivitamins Tabs (Multiple vitamin) .... Once daily 6)  Labetalol Hcl 200 Mg Tabs (Labetalol hcl) .Marland Kitchen.. 1 tab twice daily

## 2010-09-25 NOTE — Assessment & Plan Note (Signed)
Summary: FU ON MEDS/NJR   Vital Signs:  Patient profile:   77 year old female Weight:      144 pounds Temp:     98.3 degrees F oral Pulse rate:   70 / minute Pulse rhythm:   regular Resp:     16 per minute BP sitting:   190 / 88  Vitals Entered By: Lynann Beaver CMA (June 11, 2010 10:20 AM) CC: rov Is Patient Diabetic? No Pain Assessment Patient in pain? no        CC:  rov.  History of Present Illness: acute compliant Left shoulder pain---woke up with severe shoulder pain 5 days ago--no trauma no rash, no swelling or erythema  HTN---had been well controlled  Allergies: 1)  ! Ace Inhibitors  Past History:  Past Medical History: Last updated: 06/22/2006 Hypertension Osteopenia  Past Surgical History: Last updated: 12/20/2008  Cataract extraction  Family History: Last updated: 06/22/2007 Family Hsitory Breast cancer 1st degree relative <50-dtr  Social History: Last updated: 06/22/2006 Married Never Smoked  Risk Factors: Smoking Status: never (01/01/2010)  Physical Exam  General:  uncomfortable appearing elderly woman in no acute distress. HEENT exam atraumatic, normocephalic. Neck is supple. She has full range of motion shoulders. Some discomfort with movement of left shoulder but this isn't reproducible. Tenderness to palpation.   Impression & Recommendations:  Problem # 1:  SHOULDER PAIN, LEFT (ICD-719.41)  unclear etiology inject Left shoulder get xray Her updated medication list for this problem includes:    Tylenol Extra Strength 500 Mg Tabs (Acetaminophen) .Marland Kitchen... Take one qid x 5 days, then may return to as needed    Meloxicam 15 Mg Tabs (Meloxicam) ..... One by mouth daily  Orders: Joint Aspirate / Injection, Large (20610) Depo- Medrol 40mg  (J1030) T-Shoulder Left Min 2 Views (73030TC) after informed consent left shoulder was prepped and draped in a sterile fashion. Area was infiltrated with 1% lidocaine. Shoulder joint was entered  in the subacromial fashion. 40 mg Depo-Medrol injected. She will call me if symptoms persist.  Complete Medication List: 1)  Amlodipine Besylate 5 Mg Tabs (Amlodipine besylate) .Marland Kitchen.. 1 by mouth every day 2)  Tylenol Extra Strength 500 Mg Tabs (Acetaminophen) .... Take one qid x 5 days, then may return to as needed 3)  Fish Oil 1000 Mg Caps (Omega-3 fatty acids) 4)  Losartan Potassium-hctz 100-25 Mg Tabs (Losartan potassium-hctz) .... Take 1 tablet by mouth once a day 5)  Multivitamins Tabs (Multiple vitamin) .... Once daily 6)  Labetalol Hcl 200 Mg Tabs (Labetalol hcl) .Marland Kitchen.. 1 tab twice daily 7)  Meloxicam 15 Mg Tabs (Meloxicam) .... One by mouth daily   Orders Added: 1)  Est. Patient Level III [16109] 2)  Joint Aspirate / Injection, Large [20610] 3)  Depo- Medrol 40mg  [J1030] 4)  T-Shoulder Left Min 2 Views [73030TC]

## 2010-09-25 NOTE — Assessment & Plan Note (Signed)
Summary: 6 month rov/njr/pt rsc/cjr   Vital Signs:  Patient profile:   77 year old female Weight:      149 pounds Temp:     97.8 degrees F oral Pulse rate:   62 / minute Pulse rhythm:   irregular Resp:     12 per minute BP sitting:   136 / 78  (left arm) Cuff size:   regular  Vitals Entered By: Gladis Riffle, RN (October 28, 2009 11:06 AM) CC: 6 month rov Is Patient Diabetic? No   CC:  6 month rov.  History of Present Illness:  Follow-Up Visit      This is a 77 year old woman who presents for Follow-up visit.  The patient denies chest pain and palpitations.  Since the last visit the patient notes no new problems or concerns.  The patient reports taking meds as prescribed.  When questioned about possible medication side effects, the patient notes none.    All other systems reviewed and were negative   Preventive Screening-Counseling & Management  Alcohol-Tobacco     Smoking Status: never  Current Problems (verified): 1)  Preventive Health Care  (ICD-V70.0) 2)  Family Hsitory Breast Cancer 1st Degree Relative <50  (ICD-V16.3) 3)  Arthritis, Right Knee  (ICD-716.96) 4)  Vertigo  (ICD-780.4) 5)  Osteopenia  (ICD-733.90) 6)  Hypertension  (ICD-401.9)  Current Medications (verified): 1)  Amlodipine Besylate 5 Mg  Tabs (Amlodipine Besylate) .Marland Kitchen.. 1 By Mouth Every Day 2)  Tylenol 325 Mg  Tabs (Acetaminophen) .... Once Daily As Needed 3)  Fish Oil 1000 Mg Caps (Omega-3 Fatty Acids) 4)  Losartan Potassium-Hctz 100-25 Mg Tabs (Losartan Potassium-Hctz) .... Take 1 Tablet By Mouth Once A Day 5)  Multivitamins  Tabs (Multiple Vitamin) .... Once Daily 6)  Labetalol Hcl 200 Mg Tabs (Labetalol Hcl) .Marland Kitchen.. 1 Tab Twice Daily  Allergies: 1)  ! Ace Inhibitors  Past History:  Past Medical History: Last updated: 06/22/2006 Hypertension Osteopenia  Past Surgical History: Last updated: 12/20/2008  Cataract extraction  Family History: Last updated: 06/22/2007 Family Hsitory Breast  cancer 1st degree relative <50-dtr  Social History: Last updated: 06/22/2006 Married Never Smoked  Risk Factors: Smoking Status: never (10/28/2009)  Review of Systems       All other systems reviewed and were negative   Physical Exam  General:  Well-developed,well-nourished,in no acute distress; alert,appropriate and cooperative throughout examination Head:  normocephalic and atraumatic.   Eyes:  pupils equal and pupils round.   Ears:  R ear normal and L ear normal.   Neck:  No deformities, masses, or tenderness noted. Chest Wall:  No deformities, masses, or tenderness noted. Lungs:  Normal respiratory effort, chest expands symmetrically. Lungs are clear to auscultation, no crackles or wheezes. Heart:  Normal rate and regular rhythm. S1 and S2 normal without gallop, murmur, click, rub or other extra sounds. Abdomen:  Bowel sounds positive,abdomen soft and non-tender without masses, organomegaly or hernias noted. Msk:  No deformity or scoliosis noted of thoracic or lumbar spine.   Neurologic:  cranial nerves II-XII intact and gait normal.   Skin:  turgor normal and color normal.   Psych:  memory intact for recent and remote and normally interactive.     Impression & Recommendations:  Problem # 1:  VERTIGO (ICD-780.4) improved  Problem # 2:  HYPERTENSION (ICD-401.9)  reasonable control continue current medications  Her updated medication list for this problem includes:    Amlodipine Besylate 5 Mg Tabs (Amlodipine besylate) .Marland KitchenMarland KitchenMarland KitchenMarland Kitchen  1 by mouth every day    Losartan Potassium-hctz 100-25 Mg Tabs (Losartan potassium-hctz) .Marland Kitchen... Take 1 tablet by mouth once a day    Labetalol Hcl 200 Mg Tabs (Labetalol hcl) .Marland Kitchen... 1 tab twice daily  BP today: 136/78 Prior BP: 136/74 (09/02/2009)  Prior 10 Yr Risk Heart Disease: Not enough information (08/03/2006)  Labs Reviewed: K+: 4.5 (04/24/2009) Creat: : 0.8 (04/24/2009)   Chol: 203 (04/24/2009)   HDL: 55.40 (04/24/2009)   LDL: DEL  (12/22/2007)   TG: 133.0 (04/24/2009)  Orders: Venipuncture (85462) TLB-BMP (Basic Metabolic Panel-BMET) (80048-METABOL)  Problem # 3:  ARTHRITIS, RIGHT KNEE (ICD-716.96) doing reasonably well  Problem # 4:  OSTEOPENIA (ICD-733.90) no treatment at this time.   Complete Medication List: 1)  Amlodipine Besylate 5 Mg Tabs (Amlodipine besylate) .Marland Kitchen.. 1 by mouth every day 2)  Tylenol 325 Mg Tabs (Acetaminophen) .... Once daily as needed 3)  Fish Oil 1000 Mg Caps (Omega-3 fatty acids) 4)  Losartan Potassium-hctz 100-25 Mg Tabs (Losartan potassium-hctz) .... Take 1 tablet by mouth once a day 5)  Multivitamins Tabs (Multiple vitamin) .... Once daily 6)  Labetalol Hcl 200 Mg Tabs (Labetalol hcl) .Marland Kitchen.. 1 tab twice daily  Patient Instructions: 1)  Please schedule a follow-up appointment in 6 months.

## 2010-10-01 NOTE — Assessment & Plan Note (Signed)
Summary: 6 wk rov/njr   Vital Signs:  Patient profile:   77 year old female Weight:      134 pounds Temp:     98.5 degrees F oral Pulse rate:   68 / minute Pulse rhythm:   regular BP sitting:   112 / 62  (left arm) Cuff size:   regular  Vitals Entered By: Alfred Levins, CMA (September 17, 2010 11:12 AM) CC: 6wk f/u   CC:  6wk f/u.  History of Present Illness: cervical spinal stenosis---remarkably better after 3 ESI. occassionally has some left arm pain.   htn---tlerating meds without difficulty  ROS: normal appetite, no sob, no CP    Current Problems (verified): 1)  Spinal Stenosis, Cervical  (ICD-723.0) 2)  Preventive Health Care  (ICD-V70.0) 3)  Arthritis, Right Knee  (ICD-716.96) 4)  Vertigo  (ICD-780.4) 5)  Osteopenia  (ICD-733.90) 6)  Hypertension  (ICD-401.9)  Current Medications (verified): 1)  Amlodipine Besylate 5 Mg  Tabs (Amlodipine Besylate) .Marland Kitchen.. 1 By Mouth Every Day 2)  Fish Oil 1000 Mg Caps (Omega-3 Fatty Acids) 3)  Losartan Potassium-Hctz 100-25 Mg Tabs (Losartan Potassium-Hctz) .... Take 1 Tablet By Mouth Once A Day 4)  Multivitamins  Tabs (Multiple Vitamin) .... Once Daily 5)  Labetalol Hcl 200 Mg Tabs (Labetalol Hcl) .Marland Kitchen.. 1 Tab Twice Daily  Allergies (verified): 1)  ! Ace Inhibitors  Past History:  Past Medical History: Last updated: 06/22/2006 Hypertension Osteopenia  Past Surgical History: Last updated: 12/20/2008  Cataract extraction  Family History: Last updated: 06/22/2007 Family Hsitory Breast cancer 1st degree relative <50-dtr  Social History: Last updated: 06/22/2006 Married Never Smoked  Risk Factors: Smoking Status: never (01/01/2010)  Physical Exam  General:  alert and well-developed.   Head:  normocephalic and atraumatic.   Eyes:  pupils equal and pupils round.   Neck:  No deformities, masses, or tenderness noted. Lungs:  normal respiratory effort and no intercostal retractions.   Heart:  normal rate and regular  rhythm.   Abdomen:  soft and non-tender.   Skin:  turgor normal and color normal.   Psych:  good eye contact and not anxious appearing.     Impression & Recommendations:  Problem # 1:  SPINAL STENOSIS, CERVICAL (ICD-723.0) remarkably better  Problem # 2:  HYPERTENSION (ICD-401.9) adequate control continue current medications  Her updated medication list for this problem includes:    Amlodipine Besylate 5 Mg Tabs (Amlodipine besylate) .Marland Kitchen... 1 by mouth every day    Losartan Potassium-hctz 100-25 Mg Tabs (Losartan potassium-hctz) .Marland Kitchen... Take 1 tablet by mouth once a day    Labetalol Hcl 200 Mg Tabs (Labetalol hcl) .Marland Kitchen... 1 tab twice daily  BP today: 112/62 Prior BP: 122/66 (08/06/2010)  Prior 10 Yr Risk Heart Disease: Not enough information (08/03/2006)  Labs Reviewed: K+: 3.8 (05/05/2010) Creat: : 0.9 (05/05/2010)   Chol: 203 (04/24/2009)   HDL: 55.40 (04/24/2009)   LDL: DEL (12/22/2007)   TG: 133.0 (04/24/2009)  Complete Medication List: 1)  Amlodipine Besylate 5 Mg Tabs (Amlodipine besylate) .Marland Kitchen.. 1 by mouth every day 2)  Fish Oil 1000 Mg Caps (Omega-3 fatty acids) 3)  Losartan Potassium-hctz 100-25 Mg Tabs (Losartan potassium-hctz) .... Take 1 tablet by mouth once a day 4)  Multivitamins Tabs (Multiple vitamin) .... Once daily 5)  Labetalol Hcl 200 Mg Tabs (Labetalol hcl) .Marland Kitchen.. 1 tab twice daily  Patient Instructions: 1)  Please schedule a follow-up appointment in 3 months.   Orders Added: 1)  Est. Patient Level III [  99213] 

## 2010-12-17 ENCOUNTER — Ambulatory Visit: Payer: Self-pay | Admitting: Internal Medicine

## 2010-12-26 ENCOUNTER — Encounter: Payer: Self-pay | Admitting: Internal Medicine

## 2010-12-30 ENCOUNTER — Ambulatory Visit (INDEPENDENT_AMBULATORY_CARE_PROVIDER_SITE_OTHER): Payer: PRIVATE HEALTH INSURANCE | Admitting: Internal Medicine

## 2010-12-30 ENCOUNTER — Encounter: Payer: Self-pay | Admitting: Internal Medicine

## 2010-12-30 VITALS — BP 145/80 | HR 68 | Temp 98.3°F | Ht 59.0 in | Wt 136.0 lb

## 2010-12-30 DIAGNOSIS — M4802 Spinal stenosis, cervical region: Secondary | ICD-10-CM

## 2010-12-30 LAB — SEDIMENTATION RATE: Sed Rate: 16 mm/hr (ref 0–22)

## 2010-12-30 MED ORDER — HYDROCODONE-ACETAMINOPHEN 5-325 MG PO TABS: 1.0000 | ORAL_TABLET | Freq: Two times a day (BID) | ORAL | Status: DC | PRN

## 2010-12-30 NOTE — Progress Notes (Signed)
  Subjective:    Patient ID: [name], female    DOB: 1918/08/18, 77 y.o.   MRN: [id number]  HPI  Patient Active Problem List  Diagnoses  . HYPERTENSION--tolerating meds, no home bps  . ARTHRITIS, RIGHT KNEE---no significant sxs  . SPINAL STENOSIS, CERVICAL--she has chronic neck pain, now extending to both shoulders, and upper arms---this is her MAIN complaint. She takes hydrocodone with some relief. She takes one nightly---occasionally during the day   Past Medical History  Diagnosis Date  . Hypertension   . Osteopenia    Past Surgical History  Procedure Date  . Cataract extraction     reports that she has never smoked. She does not have any smokeless tobacco history on file. Her alcohol and drug histories not on file. family history includes Cancer in her daughter. Allergies  Allergen Reactions  . Ace Inhibitors     REACTION: angioedema     Review of Systems  patient denies chest pain, shortness of breath, orthopnea. Denies lower extremity edema, abdominal pain, change in appetite, change in bowel movements. Patient denies rashes, musculoskeletal complaints. No other specific complaints in a complete review of systems.      Objective:   Physical Exam  Well-developed well-nourished female in no acute distress. HEENT exam atraumatic, normocephalic, extraocular muscles are intact. Neck is supple. No jugular venous distention no thyromegaly. Chest clear to auscultation without increased work of breathing. Cardiac exam S1 and S2 are regular. Abdominal exam active bowel sounds, soft, nontender. Extremities no edema.       Assessment & Plan:

## 2010-12-30 NOTE — Assessment & Plan Note (Signed)
This is her most significant problem Reviewed imaging and procedure reports probalby best to use hydrocodone prn---i'm not concerned about addictive nature Will check esr---sxs could be PMR related

## 2011-01-07 ENCOUNTER — Telehealth: Payer: Self-pay | Admitting: *Deleted

## 2011-01-07 DIAGNOSIS — M542 Cervicalgia: Secondary | ICD-10-CM

## 2011-01-07 NOTE — Telephone Encounter (Signed)
Pt called wanting results of labs.  Gave pt normal sed rate results and she said she is still experiencing pain in her neck into her arms.  She is taking pain med but she does not want to become dependent on them.

## 2011-01-08 MED ORDER — MELOXICAM 7.5 MG PO TABS: 7.5000 mg | ORAL_TABLET | Freq: Every day | ORAL | Status: DC

## 2011-01-08 NOTE — Telephone Encounter (Signed)
Trial mobic 7.5 mg po qd #30/1

## 2011-01-08 NOTE — Telephone Encounter (Signed)
Pt aware.

## 2011-01-12 ENCOUNTER — Other Ambulatory Visit: Payer: Self-pay | Admitting: Internal Medicine

## 2011-01-12 ENCOUNTER — Encounter (HOSPITAL_BASED_OUTPATIENT_CLINIC_OR_DEPARTMENT_OTHER): Payer: Medicare Other | Admitting: Internal Medicine

## 2011-01-12 DIAGNOSIS — D72820 Lymphocytosis (symptomatic): Secondary | ICD-10-CM

## 2011-01-12 LAB — CBC WITH DIFFERENTIAL/PLATELET
BASO%: 0.4 % (ref 0.0–2.0)
Basophils Absolute: 0 10*3/uL (ref 0.0–0.1)
EOS%: 1.3 % (ref 0.0–7.0)
Eosinophils Absolute: 0.1 10*3/uL (ref 0.0–0.5)
HCT: 38.9 % (ref 34.8–46.6)
HGB: 13.4 g/dL (ref 11.6–15.9)
LYMPH%: 34.1 % (ref 14.0–49.7)
MCH: 30.1 pg (ref 25.1–34.0)
MCHC: 34.3 g/dL (ref 31.5–36.0)
MCV: 87.8 fL (ref 79.5–101.0)
MONO#: 0.5 10*3/uL (ref 0.1–0.9)
MONO%: 7.9 % (ref 0.0–14.0)
NEUT#: 3.3 10*3/uL (ref 1.5–6.5)
NEUT%: 56.3 % (ref 38.4–76.8)
Platelets: 154 10*3/uL (ref 145–400)
RBC: 4.43 10*6/uL (ref 3.70–5.45)
RDW: 14.2 % (ref 11.2–14.5)
WBC: 5.9 10*3/uL (ref 3.9–10.3)
lymph#: 2 10*3/uL (ref 0.9–3.3)

## 2011-01-12 LAB — LACTATE DEHYDROGENASE: LDH: 158 U/L (ref 94–250)

## 2011-03-25 ENCOUNTER — Telehealth: Payer: Self-pay | Admitting: *Deleted

## 2011-03-25 ENCOUNTER — Ambulatory Visit (INDEPENDENT_AMBULATORY_CARE_PROVIDER_SITE_OTHER)
Admission: RE | Admit: 2011-03-25 | Discharge: 2011-03-25 | Disposition: A | Discharge status: Home / Self Care | Payer: Medicare Other | Source: Ambulatory Visit | Attending: Internal Medicine | Admitting: Internal Medicine

## 2011-03-25 ENCOUNTER — Ambulatory Visit (INDEPENDENT_AMBULATORY_CARE_PROVIDER_SITE_OTHER): Payer: Medicare Other | Admitting: Internal Medicine

## 2011-03-25 ENCOUNTER — Encounter: Payer: Self-pay | Admitting: Internal Medicine

## 2011-03-25 VITALS — BP 126/80 | Temp 98.3°F | Wt 136.0 lb

## 2011-03-25 DIAGNOSIS — M25539 Pain in unspecified wrist: Secondary | ICD-10-CM

## 2011-03-25 DIAGNOSIS — M25532 Pain in left wrist: Secondary | ICD-10-CM

## 2011-03-25 DIAGNOSIS — I10 Essential (primary) hypertension: Secondary | ICD-10-CM

## 2011-03-25 MED ORDER — HYDROCODONE-ACETAMINOPHEN 5-325 MG PO TABS: 1.0000 | ORAL_TABLET | Freq: Two times a day (BID) | ORAL | Status: DC | PRN

## 2011-03-25 NOTE — Telephone Encounter (Signed)
Called pt to notified her left wrist showed a fx of the distal radial metaphysis.  She plans to go to [facility] if she can find someone to take her at 5:30 pm or later.  She will call us back and let us know if she can get there tonight.

## 2011-03-25 NOTE — Telephone Encounter (Signed)
Pt's daughter will take her to [facility] tonight.

## 2011-03-25 NOTE — Patient Instructions (Signed)
X-ray of the left arm and wrist as discussed Use pain medications as directed Use the soft wrist brace and try to keep the left arm elevated as much as possible Continue to use ice for the next 24 hours  Call or return to clinic prn if these symptoms worsen or fail to improve as anticipated.

## 2011-03-25 NOTE — Progress Notes (Signed)
  Subjective:    Patient ID: Wilfred Curtis, female    DOB: [birth year], 77 y.o.   MRN: [id number]  HPI  77 year old patient who has a history of arthritis and hypertension. She was gardening yesterday and she fell backwards striking her left hand and wrist on a chair. She has been applying ice and has kept the arm elevated but she has persistent pain and swelling. She does not feel that she fell on the outstretched hand that feels that she traumatized it on a chair falling backwards. She has been using a soft wrist brace with some benefit. She is using anti-inflammatories and hydrocodone for pain  Review of Systems  Musculoskeletal: Positive for joint swelling.       Objective:   Physical Exam  Constitutional: She appears well-developed and well-nourished. No distress.  Musculoskeletal:       Soft tissue swelling involving the left distal lower arm and wrist. Range of motion was impaired          Assessment & Plan:   Left wrist pain and swelling. Rule out distal radial fracture  We'll set her for an x-ray. Her analgesics refilled;  may need orthopedic referral

## 2011-04-01 ENCOUNTER — Telehealth: Payer: Self-pay | Admitting: Internal Medicine

## 2011-04-01 DIAGNOSIS — S62109A Fracture of unspecified carpal bone, unspecified wrist, initial encounter for closed fracture: Secondary | ICD-10-CM

## 2011-04-01 NOTE — Telephone Encounter (Signed)
Pt needs home health set up.  She broke her wrist and her husband is unable to help her because she is scared he will fall down.  [facility] told her to [facility].  She wants to see another orthopedist.  Pt was crying on the phone.  Told pt we would get her set up with [facility] and call her back.

## 2011-05-01 ENCOUNTER — Ambulatory Visit: Payer: PRIVATE HEALTH INSURANCE | Admitting: Internal Medicine

## 2011-05-27 ENCOUNTER — Ambulatory Visit (INDEPENDENT_AMBULATORY_CARE_PROVIDER_SITE_OTHER): Payer: Medicare Other

## 2011-05-27 DIAGNOSIS — Z23 Encounter for immunization: Secondary | ICD-10-CM

## 2011-08-10 ENCOUNTER — Ambulatory Visit (INDEPENDENT_AMBULATORY_CARE_PROVIDER_SITE_OTHER): Payer: Medicare Other | Admitting: Internal Medicine

## 2011-08-10 ENCOUNTER — Encounter: Payer: Self-pay | Admitting: Internal Medicine

## 2011-08-10 VITALS — BP 130/72 | Temp 98.1°F | Wt 131.0 lb

## 2011-08-10 DIAGNOSIS — M4802 Spinal stenosis, cervical region: Secondary | ICD-10-CM

## 2011-08-11 ENCOUNTER — Other Ambulatory Visit: Payer: Self-pay | Admitting: Internal Medicine

## 2011-08-11 DIAGNOSIS — M4802 Spinal stenosis, cervical region: Secondary | ICD-10-CM

## 2011-08-12 ENCOUNTER — Ambulatory Visit
Admission: RE | Admit: 2011-08-12 | Discharge: 2011-08-12 | Disposition: A | Discharge status: Home / Self Care | Payer: Medicare Other | Source: Ambulatory Visit | Attending: Internal Medicine | Admitting: Internal Medicine

## 2011-08-12 VITALS — BP 144/60 | HR 60

## 2011-08-12 DIAGNOSIS — M5020 Other cervical disc displacement, unspecified cervical region: Secondary | ICD-10-CM

## 2011-08-12 DIAGNOSIS — M4802 Spinal stenosis, cervical region: Secondary | ICD-10-CM

## 2011-08-12 MED ORDER — IOHEXOL 300 MG/ML  SOLN
1.0000 mL | Freq: Once | INTRAMUSCULAR | Status: AC | PRN
  Administered 2011-08-12: 1 mL via EPIDURAL

## 2011-08-12 MED ORDER — TRIAMCINOLONE ACETONIDE 40 MG/ML IJ SUSP (RADIOLOGY)
60.0000 mg | Freq: Once | INTRAMUSCULAR | Status: AC
  Administered 2011-08-12: 60 mg via EPIDURAL

## 2011-08-18 NOTE — Progress Notes (Signed)
Patient ID: [name], female   DOB: [birth year], 77 y.o.   MRN: [id number] Neck pain with right shoulder pain.  Pain is nagging and described as a deep ache Duration: several months--worseingin the past 2 weeks No weakness  Past Medical History  Diagnosis Date  . Hypertension   . Osteopenia     History   Social History  . Marital Status: Married    Spouse Name: N/A    Number of Children: N/A  . Years of Education: N/A   Occupational History  . Not on file.   Social History Main Topics  . Smoking status: Never Smoker   . Smokeless tobacco: Not on file  . Alcohol Use:   . Drug Use:   . Sexually Active:    Other Topics Concern  . Not on file   Social History Narrative  . No narrative on file    Past Surgical History  Procedure Date  . Cataract extraction     Family History  Problem Relation Age of Onset  . Cancer Daughter     breast    Allergies  Allergen Reactions  . Ace Inhibitors     REACTION: angioedema    Current Outpatient Prescriptions on File Prior to Visit  Medication Sig Dispense Refill  . amLODipine (NORVASC) 5 MG tablet Take 5 mg by mouth daily.        . fish oil-omega-3 fatty acids 1000 MG capsule Take 2 g by mouth daily.        Marland Kitchen HYDROcodone-acetaminophen (NORCO) 5-325 MG per tablet Take 1 tablet by mouth 2 (two) times daily as needed for pain.  40 tablet  3  . labetalol (NORMODYNE) 200 MG tablet Take 200 mg by mouth 2 (two) times daily.        Marland Kitchen losartan-hydrochlorothiazide (HYZAAR) 100-25 MG per tablet Take 1 tablet by mouth daily.        . Multiple Vitamin (MULTIVITAMIN) tablet Take 1 tablet by mouth daily.           patient denies chest pain, shortness of breath, orthopnea. Denies lower extremity edema, abdominal pain, change in appetite, change in bowel movements. Patient denies rashes, musculoskeletal complaints. No other specific complaints in a complete review of systems.   BP 130/72  Temp(Src) 98.1 F (36.7 C) (Oral)  Wt 131  lb (59.421 kg)  Well-developed well-nourished female in no acute distress. HEENT exam atraumatic, normocephalic, extraocular muscles are intact. Neck is supple. No jugular venous distention no thyromegaly. Chest clear to auscultation without increased work of breathing.FROM both shoulders  DTRS are symetric in upper extremities. Strength symmetric

## 2011-08-18 NOTE — Assessment & Plan Note (Signed)
Reviewed MRI i think she has a radiculopathy Refer to IR---consider steroid injection

## 2011-10-20 ENCOUNTER — Ambulatory Visit (INDEPENDENT_AMBULATORY_CARE_PROVIDER_SITE_OTHER): Payer: Medicare Other | Admitting: Internal Medicine

## 2011-10-20 ENCOUNTER — Encounter: Payer: Self-pay | Admitting: Internal Medicine

## 2011-10-20 VITALS — BP 128/70 | Temp 98.2°F | Wt 134.0 lb

## 2011-10-20 DIAGNOSIS — M792 Neuralgia and neuritis, unspecified: Secondary | ICD-10-CM

## 2011-10-20 DIAGNOSIS — IMO0002 Reserved for concepts with insufficient information to code with codable children: Secondary | ICD-10-CM

## 2011-10-20 NOTE — Progress Notes (Signed)
Patient ID: [name], female   DOB: 1918-08-04, 78 y.o.   MRN: [id number] Right arm pain--? Radicular Reviewed mri---most structural defects were left sided, however she had steroid injection in December with relief.   Past Medical History  Diagnosis Date  . Hypertension   . Osteopenia     History   Social History  . Marital Status: Married    Spouse Name: N/A    Number of Children: N/A  . Years of Education: N/A   Occupational History  . Not on file.   Social History Main Topics  . Smoking status: Never Smoker   . Smokeless tobacco: Not on file  . Alcohol Use:   . Drug Use:   . Sexually Active:    Other Topics Concern  . Not on file   Social History Narrative  . No narrative on file    Past Surgical History  Procedure Date  . Cataract extraction     Family History  Problem Relation Age of Onset  . Cancer Daughter     breast    Allergies  Allergen Reactions  . Ace Inhibitors     REACTION: angioedema    Current Outpatient Prescriptions on File Prior to Visit  Medication Sig Dispense Refill  . amLODipine (NORVASC) 5 MG tablet Take 5 mg by mouth daily.        . fish oil-omega-3 fatty acids 1000 MG capsule Take 2 g by mouth daily.        Marland Kitchen HYDROcodone-acetaminophen (NORCO) 5-325 MG per tablet Take 1 tablet by mouth 2 (two) times daily as needed for pain.  40 tablet  3  . labetalol (NORMODYNE) 200 MG tablet Take 200 mg by mouth daily.       Marland Kitchen losartan-hydrochlorothiazide (HYZAAR) 100-25 MG per tablet Take 1 tablet by mouth daily.        . Multiple Vitamin (MULTIVITAMIN) tablet Take 1 tablet by mouth daily.           patient denies chest pain, shortness of breath, orthopnea. Denies lower extremity edema, abdominal pain, change in appetite, change in bowel movements. Patient denies rashes, musculoskeletal complaints. No other specific complaints in a complete review of systems.   BP 128/70  Temp(Src) 98.2 F (36.8 C) (Oral)  Wt 134 lb (60.782 kg)  Well-developed well-nourished female in no acute distress. HEENT exam atraumatic, normocephalic, extraocular muscles are intact. Neck is supple. FROM both shoulders. DTRs in upper extremities are normal  Right arm pain--likely radicular  If no success than may need repeat imaging and NS consult

## 2011-10-21 ENCOUNTER — Other Ambulatory Visit: Payer: Self-pay | Admitting: Internal Medicine

## 2011-10-21 DIAGNOSIS — M4802 Spinal stenosis, cervical region: Secondary | ICD-10-CM

## 2011-10-23 ENCOUNTER — Ambulatory Visit
Admission: RE | Admit: 2011-10-23 | Discharge: 2011-10-23 | Disposition: A | Discharge status: Home / Self Care | Payer: Medicare Other | Source: Ambulatory Visit | Attending: Internal Medicine | Admitting: Internal Medicine

## 2011-10-23 DIAGNOSIS — M4802 Spinal stenosis, cervical region: Secondary | ICD-10-CM

## 2011-10-26 ENCOUNTER — Other Ambulatory Visit: Payer: Self-pay | Admitting: Internal Medicine

## 2011-12-29 ENCOUNTER — Telehealth: Payer: Self-pay | Admitting: Internal Medicine

## 2011-12-29 DIAGNOSIS — M4800 Spinal stenosis, site unspecified: Secondary | ICD-10-CM

## 2011-12-29 NOTE — Telephone Encounter (Signed)
Pt has spinal stenosis requesting another inj from [facility]. Can pt have referral. Pt has had 2 injections so far. Please advice

## 2011-12-30 NOTE — Telephone Encounter (Signed)
Per Dr Cato Mulligan ok to refer to interventional radiology, referral order entered, pt aware

## 2011-12-30 NOTE — Telephone Encounter (Signed)
Addended by: Alfred Levins D on: 12/30/2011 01:21 PM   Modules accepted: Orders

## 2012-01-01 ENCOUNTER — Encounter: Payer: Self-pay | Admitting: Internal Medicine

## 2012-01-04 ENCOUNTER — Ambulatory Visit
Admission: RE | Admit: 2012-01-04 | Discharge: 2012-01-04 | Disposition: A | Discharge status: Home / Self Care | Payer: Medicare Other | Source: Ambulatory Visit | Attending: Internal Medicine | Admitting: Internal Medicine

## 2012-01-04 VITALS — BP 132/59 | HR 54

## 2012-01-04 DIAGNOSIS — M4800 Spinal stenosis, site unspecified: Secondary | ICD-10-CM

## 2012-01-04 MED ORDER — IOHEXOL 300 MG/ML  SOLN
1.0000 mL | Freq: Once | INTRAMUSCULAR | Status: AC | PRN
  Administered 2012-01-04: 1 mL via EPIDURAL

## 2012-01-04 MED ORDER — TRIAMCINOLONE ACETONIDE 40 MG/ML IJ SUSP (RADIOLOGY)
60.0000 mg | Freq: Once | INTRAMUSCULAR | Status: AC
  Administered 2012-01-04: 60 mg via EPIDURAL

## 2012-01-04 NOTE — Discharge Instructions (Signed)

## 2012-01-06 ENCOUNTER — Telehealth: Payer: Self-pay | Admitting: *Deleted

## 2012-01-06 NOTE — Telephone Encounter (Signed)
left voice message to inform the patient of the new date and time 

## 2012-01-08 ENCOUNTER — Telehealth: Payer: Self-pay | Admitting: Internal Medicine

## 2012-01-08 NOTE — Telephone Encounter (Signed)
pt called to cx 5/23 appt and will c/b after she works out transport   aom

## 2012-01-14 ENCOUNTER — Other Ambulatory Visit: Payer: Medicare Other | Admitting: Lab

## 2012-01-14 ENCOUNTER — Ambulatory Visit: Payer: Medicare Other | Admitting: Internal Medicine

## 2012-02-09 ENCOUNTER — Other Ambulatory Visit: Payer: Self-pay | Admitting: *Deleted

## 2012-02-09 DIAGNOSIS — C9110 Chronic lymphocytic leukemia of B-cell type not having achieved remission: Secondary | ICD-10-CM

## 2012-02-09 NOTE — Progress Notes (Signed)
Pt called wanting to schedule a f/u appt.  Onc tx schedule completed.  SLJ

## 2012-02-10 ENCOUNTER — Telehealth: Payer: Self-pay | Admitting: Internal Medicine

## 2012-02-10 NOTE — Telephone Encounter (Signed)
n/a,mailed appt to pt per pof from sj    aom

## 2012-02-11 ENCOUNTER — Telehealth: Payer: Self-pay | Admitting: Internal Medicine

## 2012-02-11 NOTE — Telephone Encounter (Signed)
pt called in and moved appt to 7/9  aom

## 2012-03-01 ENCOUNTER — Other Ambulatory Visit (HOSPITAL_BASED_OUTPATIENT_CLINIC_OR_DEPARTMENT_OTHER): Payer: Medicare Other | Admitting: Lab

## 2012-03-01 ENCOUNTER — Telehealth: Payer: Self-pay | Admitting: Internal Medicine

## 2012-03-01 ENCOUNTER — Ambulatory Visit (HOSPITAL_BASED_OUTPATIENT_CLINIC_OR_DEPARTMENT_OTHER): Payer: Medicare Other | Admitting: Internal Medicine

## 2012-03-01 VITALS — BP 116/56 | HR 68 | Temp 98.3°F | Ht 59.0 in | Wt 130.2 lb

## 2012-03-01 DIAGNOSIS — D72820 Lymphocytosis (symptomatic): Secondary | ICD-10-CM

## 2012-03-01 DIAGNOSIS — C9110 Chronic lymphocytic leukemia of B-cell type not having achieved remission: Secondary | ICD-10-CM

## 2012-03-01 LAB — CBC WITH DIFFERENTIAL/PLATELET
BASO%: 0.5 % (ref 0.0–2.0)
Eosinophils Absolute: 0.1 10*3/uL (ref 0.0–0.5)
LYMPH%: 19.2 % (ref 14.0–49.7)
MCHC: 33.6 g/dL (ref 31.5–36.0)
MONO#: 0.7 10*3/uL (ref 0.1–0.9)
NEUT#: 7.3 10*3/uL — ABNORMAL HIGH (ref 1.5–6.5)
RBC: 4.44 10*6/uL (ref 3.70–5.45)
RDW: 13.9 % (ref 11.2–14.5)
WBC: 10.1 10*3/uL (ref 3.9–10.3)
lymph#: 1.9 10*3/uL (ref 0.9–3.3)

## 2012-03-01 LAB — LACTATE DEHYDROGENASE: LDH: 176 U/L (ref 94–250)

## 2012-03-01 NOTE — Progress Notes (Signed)
[facility] Telephone:([phone]   [phone]  OFFICE PROGRESS NOTE  Judie Petit, MD [address] [place] [address]  DIAGNOSIS: Lymphocytosis questionable for chronic lymphocytic leukemia  CURRENT THERAPY: Observation.  INTERVAL HISTORY: [name] 78 y.o. female returns to the clinic today for annual followup visit accompanied by her daughter. The patient is doing fine today with no specific complaints. She fell down in August of 2012 and she had impacted and mildly displaced fracture of the distal radial metaphysis. It is healing very well at this point. The patient denied having any significant weight loss or night sweats. She has no chest pain or shortness of breath. No bleeding issues. She has repeat CBC performed earlier today and she is here for evaluation and discussion of her lab results.  MEDICAL HISTORY: Past Medical History  Diagnosis Date  . Hypertension   . Osteopenia     ALLERGIES:  is allergic to ace inhibitors.  MEDICATIONS:  Current Outpatient Prescriptions  Medication Sig Dispense Refill  . amLODipine (NORVASC) 5 MG tablet Take 5 mg by mouth daily.        . fish oil-omega-3 fatty acids 1000 MG capsule Take 2 g by mouth daily.        Marland Kitchen HYDROcodone-acetaminophen (NORCO) 5-325 MG per tablet Take 1 tablet by mouth 2 (two) times daily as needed for pain.  40 tablet  3  . labetalol (NORMODYNE) 200 MG tablet TAKE 1 TABLET TWICE A DAY  180 tablet  1  . losartan-hydrochlorothiazide (HYZAAR) 100-25 MG per tablet Take 1 tablet by mouth daily.        . Multiple Vitamin (MULTIVITAMIN) tablet Take 1 tablet by mouth daily.          SURGICAL HISTORY:  Past Surgical History  Procedure Date  . Cataract extraction     REVIEW OF SYSTEMS:  A comprehensive review of systems was negative.   PHYSICAL EXAMINATION: General appearance: alert, cooperative and no distress Neck: no adenopathy Lymph nodes: Cervical,  supraclavicular, and axillary nodes normal. Resp: clear to auscultation bilaterally Cardio: regular rate and rhythm, S1, S2 normal, no murmur, click, rub or gallop GI: soft, non-tender; bowel sounds normal; no masses,  no organomegaly Extremities: extremities normal, atraumatic, no cyanosis or edema  ECOG PERFORMANCE STATUS: 1 - Symptomatic but completely ambulatory  Blood pressure 116/56, pulse 68, temperature 98.3 F (36.8 C), temperature source Oral, height 4\' 11"  (1.499 m), weight 130 lb 3.2 oz (59.058 kg).  LABORATORY DATA: Lab Results  Component Value Date   WBC 10.1 03/01/2012   HGB 13.7 03/01/2012   HCT 40.8 03/01/2012   MCV 91.8 03/01/2012   PLT 168 03/01/2012      Chemistry      Component Value Date/Time   NA 142 05/05/2010 1114   K 3.8 05/05/2010 1114   CL 105 05/05/2010 1114   CO2 28 05/05/2010 1114   BUN 22 05/05/2010 1114   CREATININE 0.9 05/05/2010 1114      Component Value Date/Time   CALCIUM 9.1 05/05/2010 1114   ALKPHOS 62 06/14/2008 1058   AST 15 06/14/2008 1058   ALT 15 06/14/2008 1058   BILITOT 0.7 06/14/2008 1058       RADIOGRAPHIC STUDIES: No results found.  ASSESSMENT: This is a very pleasant 78 years old white female with history of lymphocytosis questionable for chronic lymphocytic leukemia currently on observation with no evidence for disease progression.  PLAN: I discussed the lab  result with the patient and her daughter. I recommended for her continuous observation for now with repeat CBC and LDH in one year. She was advised to call me immediately if she has any concerning symptoms in the interval.  All questions were answered. The patient knows to call the clinic with any problems, questions or concerns. We can certainly see the patient much sooner if necessary.

## 2012-03-01 NOTE — Telephone Encounter (Signed)
gv pt appt schedule for July 2014.  °

## 2012-03-02 ENCOUNTER — Other Ambulatory Visit: Payer: Medicare Other | Admitting: Lab

## 2012-03-02 ENCOUNTER — Ambulatory Visit: Payer: Medicare Other | Admitting: Internal Medicine

## 2012-05-16 ENCOUNTER — Ambulatory Visit (INDEPENDENT_AMBULATORY_CARE_PROVIDER_SITE_OTHER): Payer: Medicare Other | Admitting: Family Medicine

## 2012-05-16 ENCOUNTER — Encounter: Payer: Self-pay | Admitting: Family Medicine

## 2012-05-16 VITALS — BP 120/70 | Temp 98.1°F | Wt 135.0 lb

## 2012-05-16 DIAGNOSIS — H6980 Other specified disorders of Eustachian tube, unspecified ear: Secondary | ICD-10-CM

## 2012-05-16 DIAGNOSIS — J069 Acute upper respiratory infection, unspecified: Secondary | ICD-10-CM

## 2012-05-16 MED ORDER — FLUTICASONE PROPIONATE 50 MCG/ACT NA SUSP: 2.0000 | Freq: Every day | NASAL | Status: DC

## 2012-05-16 NOTE — Patient Instructions (Signed)
-  As we discussed, we have prescribed a new medication for you at this appointment. We discussed the common and serious potential adverse effects of this medication and you can review these and more with the pharmacist when you pick up your medication.  Please follow the instructions for use carefully and notify us immediately if you have any problems taking this medication.   -if not improving or getting worse over next several days or hearing problems please return to clinic

## 2012-05-16 NOTE — Progress Notes (Signed)
Chief Complaint  Patient presents with  . right ear pain    HPI:  Right ear issue: -started a few days ago -had some roaring or crackling like sounds for a few seconds on and off in R ear - stops when she shakes her head. Occ in other side too. -Feels like something in the ear  -has been washing ear with peroxide and and improved -has had a tickle in her throat, a little drainage and cough  -has had similar problem in the past and had wax in her ear so wanted to see if  She needs to have ears cleaned -denies: decreased hearing, vision changes, fever, pain in R head or neck  ROS: See pertinent positives and negatives per HPI.  Past Medical History  Diagnosis Date  . Hypertension   . Osteopenia     Family History  Problem Relation Age of Onset  . Cancer Daughter     breast    History   Social History  . Marital Status: Married    Spouse Name: N/A    Number of Children: N/A  . Years of Education: N/A   Social History Main Topics  . Smoking status: Never Smoker   . Smokeless tobacco: None  . Alcohol Use:   . Drug Use:   . Sexually Active:    Other Topics Concern  . None   Social History Narrative  . None    Current outpatient prescriptions:amLODipine (NORVASC) 5 MG tablet, Take 5 mg by mouth daily.  , Disp: , Rfl: ;  fish oil-omega-3 fatty acids 1000 MG capsule, Take 2 g by mouth daily.  , Disp: , Rfl: ;  labetalol (NORMODYNE) 200 MG tablet, TAKE 1 TABLET TWICE A DAY, Disp: 180 tablet, Rfl: 1;  losartan-hydrochlorothiazide (HYZAAR) 100-25 MG per tablet, Take 1 tablet by mouth daily.  , Disp: , Rfl:  Multiple Vitamin (MULTIVITAMIN) tablet, Take 1 tablet by mouth daily.  , Disp: , Rfl: ;  fluticasone (FLONASE) 50 MCG/ACT nasal spray, Place 2 sprays into the nose daily., Disp: 16 g, Rfl: 6;  HYDROcodone-acetaminophen (NORCO) 5-325 MG per tablet, Take 1 tablet by mouth 2 (two) times daily as needed for pain., Disp: 40 tablet, Rfl: 3  EXAM:  Filed Vitals:   05/16/12  1401  BP: 120/70  Temp: 98.1 F (36.7 C)    There is no height on file to calculate BMI.  GENERAL: vitals reviewed and listed below, alert, oriented, appears well hydrated and in no acute distress  HEENT: atraumatic, conjucntiva clear, no obvious abnormalities on inspection of external nose and ears, ear canal with a small amount of soft wax but normal otherwise and normal TMs. Clear rhinorrhea. PND and mild post oropharyngeal erythema.   NECK: no masses on inspection, no bruit or JVD  LUNGS: clear to auscultation bilaterally, no wheezes, rales or rhonchi, good air movement  CV: HRRR, no peripheral edema  MS: moves all extremities without noticeable abnormality  PSYCH: pleasant and cooperative, no obvious depression or anxiety  ASSESSMENT AND PLAN:  Discussed the following assessment and plan:  1. Eustachian tube dysfunction  fluticasone (FLONASE) 50 MCG/ACT nasal spray  2. Viral upper respiratory infection  fluticasone (FLONASE) 50 MCG/ACT nasal spray,   -likely eustachian tube dysfunction versus symptoms. flonase provided with return precautions.  No orders of the defined types were placed in this encounter.    Patient Instructions  -As we discussed, we have prescribed a new medication for you at this appointment. We discussed  the common and serious potential adverse effects of this medication and you can review these and more with the pharmacist when you pick up your medication.  Please follow the instructions for use carefully and notify us immediately if you have any problems taking this medication.   -if not improving or getting worse over next several days or hearing problems please return to clinic      Return to clinic immediately if symptoms worsen or persist or new concerns.  No Follow-up on file.  Kriste Basque R.

## 2012-05-18 ENCOUNTER — Ambulatory Visit (INDEPENDENT_AMBULATORY_CARE_PROVIDER_SITE_OTHER): Payer: Medicare Other

## 2012-05-18 DIAGNOSIS — Z23 Encounter for immunization: Secondary | ICD-10-CM

## 2012-08-24 ENCOUNTER — Other Ambulatory Visit: Payer: Self-pay | Admitting: Internal Medicine

## 2012-08-25 ENCOUNTER — Other Ambulatory Visit: Payer: Self-pay | Admitting: Internal Medicine

## 2012-11-11 ENCOUNTER — Other Ambulatory Visit: Payer: Self-pay | Admitting: *Deleted

## 2012-11-11 MED ORDER — LOSARTAN POTASSIUM-HCTZ 100-25 MG PO TABS: 1.0000 | ORAL_TABLET | Freq: Every day | ORAL | Status: DC

## 2012-12-07 ENCOUNTER — Other Ambulatory Visit: Payer: Self-pay | Admitting: *Deleted

## 2012-12-07 MED ORDER — LOSARTAN POTASSIUM-HCTZ 100-25 MG PO TABS: 1.0000 | ORAL_TABLET | Freq: Every day | ORAL | Status: DC

## 2013-01-30 ENCOUNTER — Encounter: Payer: Self-pay | Admitting: Internal Medicine

## 2013-02-27 ENCOUNTER — Telehealth: Payer: Self-pay | Admitting: Internal Medicine

## 2013-02-27 NOTE — Telephone Encounter (Signed)
Pt called to cx 7/9 appt. Per pt dtr who brings her is away and she will call back to r/s when her dtr returns.

## 2013-03-01 ENCOUNTER — Other Ambulatory Visit: Payer: Medicare Other | Admitting: Lab

## 2013-03-01 ENCOUNTER — Ambulatory Visit: Payer: Medicare Other | Admitting: Internal Medicine

## 2013-03-24 ENCOUNTER — Encounter: Payer: Self-pay | Admitting: Internal Medicine

## 2013-03-24 ENCOUNTER — Ambulatory Visit (INDEPENDENT_AMBULATORY_CARE_PROVIDER_SITE_OTHER): Payer: Medicare Other | Admitting: Internal Medicine

## 2013-03-24 VITALS — BP 144/70 | HR 68 | Temp 98.2°F | Wt 133.0 lb

## 2013-03-24 DIAGNOSIS — IMO0002 Reserved for concepts with insufficient information to code with codable children: Secondary | ICD-10-CM

## 2013-03-24 DIAGNOSIS — M5410 Radiculopathy, site unspecified: Secondary | ICD-10-CM

## 2013-03-24 NOTE — Progress Notes (Signed)
Leg pain- right sided- below the knee  Neck discomfort- see MRI 2011  htn--  BP Readings from Last 3 Encounters:  03/24/13 144/70  05/16/12 120/70  03/01/12 116/56   Tolerating meds  Reviewed pmh, meds shx   Well-developed well-nourished female in no acute distress. HEENT exam atraumatic, normocephalic, extraocular muscles are intact. Neck is supple.  Absent reflexes in leg Leg strenght is 4+ bilaterally and symmetric   A/p- suspect radiculopathy i will further evaluate if sxs worsen-.  sxs currently are not limiting ADLS or other activities

## 2013-04-12 ENCOUNTER — Ambulatory Visit: Payer: Medicare Other | Attending: Internal Medicine | Admitting: Physical Therapy

## 2013-04-12 DIAGNOSIS — M545 Low back pain, unspecified: Secondary | ICD-10-CM | POA: Insufficient documentation

## 2013-04-12 DIAGNOSIS — M25569 Pain in unspecified knee: Secondary | ICD-10-CM | POA: Insufficient documentation

## 2013-04-12 DIAGNOSIS — IMO0001 Reserved for inherently not codable concepts without codable children: Secondary | ICD-10-CM | POA: Insufficient documentation

## 2013-05-03 ENCOUNTER — Telehealth: Payer: Self-pay | Admitting: *Deleted

## 2013-05-03 NOTE — Telephone Encounter (Signed)
Pt calls complaining of a burning sensation with pain in her right foot.  She stated that physical therapy told her there was nothing they could to help her but did give her some home exercises.  She has since had burning under her toes.  Dr Cato Mulligan is out of the office this week and next so she wanted to see Dr Caryl Never.  Appt scheduled with Dr Caryl Never tomorrow afternoon at 3:30

## 2013-05-04 ENCOUNTER — Ambulatory Visit (INDEPENDENT_AMBULATORY_CARE_PROVIDER_SITE_OTHER): Payer: Medicare Other | Admitting: Family Medicine

## 2013-05-04 ENCOUNTER — Encounter: Payer: Self-pay | Admitting: Family Medicine

## 2013-05-04 VITALS — BP 132/68 | HR 58 | Temp 98.0°F | Ht 59.0 in | Wt 135.0 lb

## 2013-05-04 DIAGNOSIS — M792 Neuralgia and neuritis, unspecified: Secondary | ICD-10-CM

## 2013-05-04 DIAGNOSIS — G5790 Unspecified mononeuropathy of unspecified lower limb: Secondary | ICD-10-CM

## 2013-05-04 MED ORDER — GABAPENTIN 100 MG PO CAPS: ORAL_CAPSULE | ORAL | Status: DC

## 2013-05-04 MED ORDER — AMLODIPINE BESYLATE 5 MG PO TABS: 5.0000 mg | ORAL_TABLET | Freq: Every day | ORAL | Status: DC

## 2013-05-04 MED ORDER — LABETALOL HCL 200 MG PO TABS: 200.0000 mg | ORAL_TABLET | Freq: Two times a day (BID) | ORAL | Status: DC

## 2013-05-04 MED ORDER — LOSARTAN POTASSIUM-HCTZ 100-25 MG PO TABS: 1.0000 | ORAL_TABLET | Freq: Every day | ORAL | Status: DC

## 2013-05-04 NOTE — Progress Notes (Signed)
  Subjective:    Patient ID: [name], female    DOB: Mar 07, 1918, 79 y.o.   MRN: [id number]  HPI Acute visit for burning sensation right foot Location is somewhat diffuse along the metatarsal heads. Symptoms are worse at night. 8/10 severity at its worst. She denies any significant radiculopathy symptoms currently.   Back in August she did have some right lumbar-type radiculopathy. Currently denies any leg or thigh pain. No current back pain. She tried icing with no significant improvement. She's not taken any medications. No history of diabetes. No left foot symptoms. No recent change of shoes.  Past Medical History  Diagnosis Date  . Hypertension   . Osteopenia    Past Surgical History  Procedure Laterality Date  . Cataract extraction      reports that she has never smoked. She does not have any smokeless tobacco history on file. Her alcohol and drug histories are not on file. family history includes Cancer in her daughter. Allergies  Allergen Reactions  . Ace Inhibitors     REACTION: angioedema      Review of Systems  Constitutional: Negative for fever and chills.  Neurological: Negative for weakness and numbness.       Objective:   Physical Exam  Constitutional: She appears well-developed and well-nourished.  Cardiovascular: Normal rate and regular rhythm.   Pulmonary/Chest: Effort normal and breath sounds normal. No respiratory distress. She has no wheezes. She has no rales.  Musculoskeletal: She exhibits no edema.  Right foot reveals good distal pulses. Foot is warm to touch with good capillary refill. She has no localized bony tenderness. She has no bunion right foot the nontender. No visible ecchymosis. No warmth or erythema.  Neurological:  No focal weakness lower extremities. Deep tender reflexes are only trace ankle bilaterally and difficult to listen knee bilaterally          Assessment & Plan:  Right foot pain. Suspect neuropathic. Start low-dose  gabapentin 100 mg each bedtime. Reassess in 2 weeks. Consider podiatry referral if symptoms persist

## 2013-05-10 ENCOUNTER — Telehealth: Payer: Self-pay | Admitting: Family Medicine

## 2013-05-10 NOTE — Telephone Encounter (Signed)
Pt states the gabapentin (NEURONTIN) 100 MG capsule makes her feel like a "zombie" in the morning. Would like you to know that she has stopped taking. Pt would like you to call

## 2013-05-11 NOTE — Telephone Encounter (Signed)
Spoke with patient. Gabapentin did help her nerve pain but she had significant sedation the next day even at 100 mg. She has followup on the 25th and will discuss other options then.

## 2013-05-17 ENCOUNTER — Encounter: Payer: Self-pay | Admitting: Family Medicine

## 2013-05-17 ENCOUNTER — Ambulatory Visit (INDEPENDENT_AMBULATORY_CARE_PROVIDER_SITE_OTHER): Payer: Medicare Other | Admitting: Family Medicine

## 2013-05-17 VITALS — BP 120/66 | HR 54 | Temp 97.5°F | Wt 136.0 lb

## 2013-05-17 DIAGNOSIS — M79609 Pain in unspecified limb: Secondary | ICD-10-CM

## 2013-05-17 DIAGNOSIS — M79671 Pain in right foot: Secondary | ICD-10-CM

## 2013-05-17 DIAGNOSIS — Z23 Encounter for immunization: Secondary | ICD-10-CM

## 2013-05-17 NOTE — Progress Notes (Signed)
  Subjective:    Patient ID: [name], female    DOB: 02/27/1918, 79 y.o.   MRN: [id number]  HPI Persistent right foot pain. Patient describes burning-type pain right foot volar surface metatarsal heads. She initially went to podiatrist and apparently had x-rays. We suspect component of neuropathic pain and tried very low dose gabapentin 100 mg at night but she said she had some hangover drowsiness next day. She did feel her pain diminished but only took for 3 nights.  she's not taken any since then.  Patient subsequently saw orthopedist and was given some type of topical (?diclofenac) which did not help much. She was given some type of walking shoe which she had difficulty walking in. She has some non-custom orthotics which have not helped her pain. No history of injury. No current low back pain. Pain is fairly diffuse across her metatarsal head region right foot. No visible swelling  Past Medical History  Diagnosis Date  . Hypertension   . Osteopenia    Past Surgical History  Procedure Laterality Date  . Cataract extraction      reports that she has never smoked. She does not have any smokeless tobacco history on file. Her alcohol and drug histories are not on file. family history includes Cancer in her daughter. Allergies  Allergen Reactions  . Ace Inhibitors     REACTION: angioedema      Review of Systems  Constitutional: Negative for fever and chills.  Musculoskeletal: Negative for gait problem.       Objective:   Physical Exam  Constitutional: She appears well-developed and well-nourished.  Cardiovascular: Normal rate.   Pulmonary/Chest: Effort normal. No respiratory distress. She has no wheezes. She has no rales.  Musculoskeletal:  Right foot reveals no edema. No erythema. No warmth. She has very little fat pad over her right plantar region. She has some minimal diffuse tenderness across her metatarsal heads. Good distal foot pulses.          Assessment &  Plan:  Right foot pain. This is mostly centered over her metatarsal heads. We suggested metatarsal pad. We have suggested consideration for followup with our sports medicine specialist to consider ? custom orthotics and she'll consider. Flu vaccine given

## 2013-05-17 NOTE — Patient Instructions (Addendum)
Try over the counter metatarsal pad for right foot pain

## 2013-05-18 ENCOUNTER — Ambulatory Visit: Payer: Medicare Other | Admitting: Family Medicine

## 2013-05-27 ENCOUNTER — Other Ambulatory Visit: Payer: Self-pay | Admitting: Internal Medicine

## 2013-06-30 ENCOUNTER — Telehealth: Payer: Self-pay | Admitting: *Deleted

## 2013-06-30 DIAGNOSIS — M4802 Spinal stenosis, cervical region: Secondary | ICD-10-CM

## 2013-06-30 NOTE — Telephone Encounter (Signed)
Pt is having neck pain again and she has gotten an epidural injection in the past and would like to know if Dr Cato Mulligan would refer her to get another one.  Please advise

## 2013-07-02 NOTE — Telephone Encounter (Signed)
Ok to refer to interventional radiology injection

## 2013-07-03 NOTE — Telephone Encounter (Signed)
Referral order placed.

## 2013-07-03 NOTE — Telephone Encounter (Signed)
Received a vm from patient wanting to know the next step in the process. Called and spoke with pt and advised of Dr. Cato Mulligan recommendations.

## 2013-07-04 NOTE — Addendum Note (Signed)
Addended by: Alfred Levins D on: 07/04/2013 08:15 AM   Modules accepted: Orders

## 2013-07-10 ENCOUNTER — Ambulatory Visit
Admission: RE | Admit: 2013-07-10 | Discharge: 2013-07-10 | Disposition: A | Discharge status: Home / Self Care | Payer: Medicare Other | Source: Ambulatory Visit | Attending: Internal Medicine | Admitting: Internal Medicine

## 2013-07-10 DIAGNOSIS — M4802 Spinal stenosis, cervical region: Secondary | ICD-10-CM

## 2013-07-10 MED ORDER — IOHEXOL 300 MG/ML  SOLN
1.0000 mL | Freq: Once | INTRAMUSCULAR | Status: AC | PRN
  Administered 2013-07-10: 1 mL via EPIDURAL

## 2013-07-10 MED ORDER — TRIAMCINOLONE ACETONIDE 40 MG/ML IJ SUSP (RADIOLOGY)
60.0000 mg | Freq: Once | INTRAMUSCULAR | Status: AC
  Administered 2013-07-10: 60 mg via EPIDURAL

## 2013-08-23 ENCOUNTER — Ambulatory Visit (INDEPENDENT_AMBULATORY_CARE_PROVIDER_SITE_OTHER): Payer: Medicare Other | Admitting: Internal Medicine

## 2013-08-23 ENCOUNTER — Encounter: Payer: Self-pay | Admitting: Internal Medicine

## 2013-08-23 VITALS — BP 126/68 | HR 66 | Temp 97.7°F | Resp 20 | Wt 135.0 lb

## 2013-08-23 DIAGNOSIS — I10 Essential (primary) hypertension: Secondary | ICD-10-CM

## 2013-08-23 DIAGNOSIS — M25531 Pain in right wrist: Secondary | ICD-10-CM

## 2013-08-23 DIAGNOSIS — M25539 Pain in unspecified wrist: Secondary | ICD-10-CM

## 2013-08-23 DIAGNOSIS — J069 Acute upper respiratory infection, unspecified: Secondary | ICD-10-CM

## 2013-08-23 NOTE — Patient Instructions (Signed)
Call or return to clinic prn if these symptoms worsen or fail to improve as anticipated.  Use saline irrigation, warm  moist compresses and over-the-counter decongestants only as directed.  Call if there is no improvement in 5 to 7 days, or sooner if you develop increasing pain, fever, or any new symptoms. 

## 2013-08-23 NOTE — Progress Notes (Signed)
Pre-visit discussion using our clinic review tool. No additional management support is needed unless otherwise documented below in the visit note.  

## 2013-08-23 NOTE — Progress Notes (Signed)
Subjective:    Patient ID: [name], female    DOB: [birth year], 79 y.o.   MRN: [id number]  HPI  79 year old patient who has treated hypertension and also history of osteoarthritis. She presents with a chief complaint of right wrist pain. She woke up with this 7 days ago. She has been icing the wrist 3-4 times daily and has wrapped the wrist an Ace bandage and is much improved. In the past few days she's also had some cough and URI symptoms with nasal congestion no fever or purulent productive cough. No shortness of breath or wheezing.  Past Medical History  Diagnosis Date  . Hypertension   . Osteopenia     History   Social History  . Marital Status: Married    Spouse Name: N/A    Number of Children: N/A  . Years of Education: N/A   Occupational History  . Not on file.   Social History Main Topics  . Smoking status: Never Smoker   . Smokeless tobacco: Not on file  . Alcohol Use:   . Drug Use:   . Sexual Activity:    Other Topics Concern  . Not on file   Social History Narrative  . No narrative on file    Past Surgical History  Procedure Laterality Date  . Cataract extraction      Family History  Problem Relation Age of Onset  . Cancer Daughter     breast    Allergies  Allergen Reactions  . Ace Inhibitors     REACTION: angioedema    Current Outpatient Prescriptions on File Prior to Visit  Medication Sig Dispense Refill  . amLODipine (NORVASC) 5 MG tablet Take 1 tablet (5 mg total) by mouth daily.  90 tablet  3  . Biotin 5000 MCG CAPS Take 1 capsule by mouth daily.      . fish oil-omega-3 fatty acids 1000 MG capsule Take 2 g by mouth daily.        Marland Kitchen labetalol (NORMODYNE) 200 MG tablet Take 1 tablet (200 mg total) by mouth 2 (two) times daily.  180 tablet  3  . losartan-hydrochlorothiazide (HYZAAR) 100-25 MG per tablet TAKE 1 TABLET BY MOUTH EVERY DAY  90 tablet  1  . Multiple Vitamin (MULTIVITAMIN) tablet Take 1 tablet by mouth daily.         No  current facility-administered medications on file prior to visit.    BP 126/68  Pulse 66  Temp(Src) 97.7 F (36.5 C) (Oral)  Resp 20  Wt 135 lb (61.236 kg)  SpO2 97%       Review of Systems  Constitutional: Negative.   HENT: Positive for sinus pressure. Negative for congestion, dental problem, hearing loss, rhinorrhea, sore throat and tinnitus.   Eyes: Negative for pain, discharge and visual disturbance.  Respiratory: Positive for cough. Negative for shortness of breath.   Cardiovascular: Negative for chest pain, palpitations and leg swelling.  Gastrointestinal: Negative for nausea, vomiting, abdominal pain, diarrhea, constipation, blood in stool and abdominal distention.  Genitourinary: Negative for dysuria, urgency, frequency, hematuria, flank pain, vaginal bleeding, vaginal discharge, difficulty urinating, vaginal pain and pelvic pain.  Musculoskeletal: Negative for arthralgias, gait problem and joint swelling.       Right wrist pain  Skin: Negative for rash.  Neurological: Negative for dizziness, syncope, speech difficulty, weakness, numbness and headaches.  Hematological: Negative for adenopathy.  Psychiatric/Behavioral: Negative for behavioral problems, dysphoric mood and agitation. The patient is not nervous/anxious.  Objective:   Physical Exam  Constitutional: She is oriented to person, place, and time. She appears well-developed and well-nourished.  HENT:  Head: Normocephalic.  Right Ear: External ear normal.  Left Ear: External ear normal.  Mouth/Throat: Oropharynx is clear and moist.  Minimal cerumen left canal ulnar  Eyes: Conjunctivae and EOM are normal. Pupils are equal, round, and reactive to light.  Neck: Normal range of motion. Neck supple. No thyromegaly present.  Cardiovascular: Normal rate, regular rhythm, normal heart sounds and intact distal pulses.   Pulmonary/Chest: Effort normal and breath sounds normal.  Abdominal: Soft. Bowel sounds are  normal. She exhibits no mass. There is no tenderness.  Musculoskeletal: Normal range of motion.  Osteoarthritic changes involving the small joints of the hand Right wrist was examined No pain or soft tissue swelling  Lymphadenopathy:    She has no cervical adenopathy.  Neurological: She is alert and oriented to person, place, and time.  Skin: Skin is warm and dry. No rash noted.  Psychiatric: She has a normal mood and affect. Her behavior is normal.          Assessment & Plan:   Right wrist pain. Resolved probable tendinitis Mild viral URI. Will continue Mucinex

## 2013-08-29 ENCOUNTER — Other Ambulatory Visit: Payer: Self-pay | Admitting: Internal Medicine

## 2013-10-16 ENCOUNTER — Telehealth: Payer: Self-pay | Admitting: Internal Medicine

## 2013-10-16 DIAGNOSIS — M4802 Spinal stenosis, cervical region: Secondary | ICD-10-CM

## 2013-10-16 NOTE — Telephone Encounter (Signed)
Referral order placed.

## 2013-10-16 NOTE — Telephone Encounter (Signed)
Pt wants another neck injection.  Danielle from [facility] would like to know if another order can be entered?

## 2013-10-16 NOTE — Telephone Encounter (Signed)
ok 

## 2013-10-18 ENCOUNTER — Ambulatory Visit
Admission: RE | Admit: 2013-10-18 | Discharge: 2013-10-18 | Disposition: A | Discharge status: Home / Self Care | Payer: Medicare Other | Source: Ambulatory Visit | Attending: Internal Medicine | Admitting: Internal Medicine

## 2013-10-18 DIAGNOSIS — M4802 Spinal stenosis, cervical region: Secondary | ICD-10-CM

## 2013-10-18 MED ORDER — IOHEXOL 300 MG/ML  SOLN
1.0000 mL | Freq: Once | INTRAMUSCULAR | Status: AC | PRN
  Administered 2013-10-18: 1 mL via EPIDURAL

## 2013-10-18 MED ORDER — TRIAMCINOLONE ACETONIDE 40 MG/ML IJ SUSP (RADIOLOGY)
60.0000 mg | Freq: Once | INTRAMUSCULAR | Status: AC
  Administered 2013-10-18: 60 mg via EPIDURAL

## 2013-10-18 NOTE — Discharge Instructions (Signed)

## 2013-10-24 ENCOUNTER — Other Ambulatory Visit: Payer: Self-pay | Admitting: Internal Medicine

## 2013-10-26 ENCOUNTER — Other Ambulatory Visit: Payer: Medicare Other

## 2013-10-30 ENCOUNTER — Ambulatory Visit (INDEPENDENT_AMBULATORY_CARE_PROVIDER_SITE_OTHER): Payer: Medicare Other | Admitting: Family Medicine

## 2013-10-30 ENCOUNTER — Encounter: Payer: Self-pay | Admitting: Family Medicine

## 2013-10-30 VITALS — BP 124/66 | HR 70 | Temp 97.2°F | Wt 128.0 lb

## 2013-10-30 DIAGNOSIS — M25569 Pain in unspecified knee: Secondary | ICD-10-CM

## 2013-10-30 DIAGNOSIS — M25561 Pain in right knee: Secondary | ICD-10-CM

## 2013-10-30 MED ORDER — TRAMADOL HCL 50 MG PO TABS: 50.0000 mg | ORAL_TABLET | Freq: Three times a day (TID) | ORAL | Status: DC | PRN

## 2013-10-30 NOTE — Progress Notes (Signed)
Pre visit review using our clinic review tool, if applicable. No additional management support is needed unless otherwise documented below in the visit note. 

## 2013-10-30 NOTE — Progress Notes (Signed)
   Subjective:    Patient ID: [name], female    DOB: 05/21/1918, 80 y.o.   MRN: [id number]  Knee Pain    Patient seen with right knee pain. Onset about 3-4 months ago. She denies injury. She's had some mild swelling off and on. No locking or giving way. She is using topical BenGay with mild relief. Tylenol mild relief. Still having significant knee pain at night. She does not recall ever having x-rays of this previously. Her pain is starting to disrupt sleep more at night.  Past Medical History  Diagnosis Date  . Hypertension   . Osteopenia    Past Surgical History  Procedure Laterality Date  . Cataract extraction      reports that she has never smoked. She does not have any smokeless tobacco history on file. Her alcohol and drug histories are not on file. family history includes Cancer in her daughter. Allergies  Allergen Reactions  . Ace Inhibitors     REACTION: angioedema      Review of Systems  Constitutional: Negative for fever and chills.  Musculoskeletal: Negative for gait problem.       Objective:   Physical Exam  Constitutional: She appears well-developed and well-nourished.  Cardiovascular: Normal rate and regular rhythm.   Pulmonary/Chest: Effort normal and breath sounds normal. No respiratory distress. She has no wheezes. She has no rales.  Musculoskeletal:  Right knee reveals no effusion. Good range of motion. No erythema. No warmth. No ecchymosis. No specific tenderness          Assessment & Plan:  Right knee pain. Suspect osteoarthritis. Given duration of symptoms, we recommended x-rays to further assess. Continue Tylenol. Add Ultram 50 mg every 8 hours for severe pain. Consider corticosteroid injection if not improving with the above

## 2013-10-30 NOTE — Patient Instructions (Signed)
Osteoarthritis Osteoarthritis is a disease that causes soreness and swelling (inflammation) of a joint. It occurs when the cartilage at the affected joint wears down. Cartilage acts as a cushion, covering the ends of bones where they meet to form a joint. Osteoarthritis is the most common form of arthritis. It often occurs in older people. The joints affected most often by this condition include those in the:  Ends of the fingers.  Thumbs.  Neck.  Lower back.  Knees.  Hips. CAUSES  Over time, the cartilage that covers the ends of bones begins to wear away. This causes bone to rub on bone, producing pain and stiffness in the affected joints.  RISK FACTORS Certain factors can increase your chances of having osteoarthritis, including:  Older age.  Excessive body weight.  Overuse of joints. SIGNS AND SYMPTOMS   Pain, swelling, and stiffness in the joint.  Over time, the joint may lose its normal shape.  Small deposits of bone (osteophytes) may grow on the edges of the joint.  Bits of bone or cartilage can break off and float inside the joint space. This may cause more pain and damage. DIAGNOSIS  Your health care provider will do a physical exam and ask about your symptoms. Various tests may be ordered, such as:  X-rays of the affected joint.  An MRI scan.  Blood tests to rule out other types of arthritis.  Joint fluid tests. This involves using a needle to draw fluid from the joint and examining the fluid under a microscope. TREATMENT  Goals of treatment are to control pain and improve joint function. Treatment plans may include:  A prescribed exercise program that allows for rest and joint relief.  A weight control plan.  Pain relief techniques, such as:  Properly applied heat and cold.  Electric pulses delivered to nerve endings under the skin (transcutaneous electrical nerve stimulation, TENS).  Massage.  Certain nutritional supplements.  Medicines to  control pain, such as:  Acetaminophen.  Nonsteroidal anti-inflammatory drugs (NSAIDs), such as naproxen.  Narcotic or central-acting agents, such as tramadol.  Corticosteroids. These can be given orally or as an injection.  Surgery to reposition the bones and relieve pain (osteotomy) or to remove loose pieces of bone and cartilage. Joint replacement may be needed in advanced states of osteoarthritis. HOME CARE INSTRUCTIONS   Only take over-the-counter or prescription medicines as directed by your health care provider. Take all medicines exactly as instructed.  Maintain a healthy weight. Follow your health care provider's instructions for weight control. This may include dietary instructions.  Exercise as directed. Your health care provider can recommend specific types of exercise. These may include:  Strengthening exercises These are done to strengthen the muscles that support joints affected by arthritis. They can be performed with weights or with exercise bands to add resistance.  Aerobic activities These are exercises, such as brisk walking or low-impact aerobics, that get your heart pumping.  Range-of-motion activities These keep your joints limber.  Balance and agility exercises These help you maintain daily living skills.  Rest your affected joints as directed by your health care provider.  Follow up with your health care provider as directed. SEEK MEDICAL CARE IF:   Your skin turns red.  You develop a rash in addition to your joint pain.  You have worsening joint pain. SEEK IMMEDIATE MEDICAL CARE IF:  You have a significant loss of weight or appetite.  You have a fever along with joint or muscle aches.  You have   night sweats. FOR MORE INFORMATION  National Institute of Arthritis and Musculoskeletal and Skin Diseases: www.niams.nih.gov National Institute on Aging: www.nia.nih.gov American College of Rheumatology: www.rheumatology.org Document Released: 08/10/2005  Document Revised: 05/31/2013 Document Reviewed: 04/17/2013 ExitCare Patient Information 2014 [employer].  

## 2013-12-26 ENCOUNTER — Telehealth: Payer: Self-pay | Admitting: *Deleted

## 2013-12-26 NOTE — Telephone Encounter (Signed)
Patient called and stated she went to the Saturday clinic on 12/23/13 at 11am for knee pain.  She was turned away by whomever was working the front desk and was told that we did not have the clinic anymore.  She then went to [facility] and was told it was a 2 hour wait so she went to [facility].  There she was told that her knee was "bone on bone" and got a cortisone injection.  Pt was wanting to know if she should follow up with Dr Leanne Chang.  She is feeling much better.  Told pt no need to follow up unless she wanted to.  She did not want to stay with [facility], she stated that she could follow up (in the event the knee pain came back) with [facility] since she is already establish there.  Told pt that was fine with Dr. Leanne Chang.  I apologized to patient for being turned away from the Saturday clinic and explained to her that we do, in fact, still have it.  Lillia Mountain and Dahlia Byes are aware of her experience.  Nothing further was needed.

## 2014-01-08 ENCOUNTER — Telehealth: Payer: Self-pay | Admitting: Internal Medicine

## 2014-01-08 NOTE — Telephone Encounter (Signed)
Patient Information:  Caller Name: Nayelli  Phone: [phone]  Patient: [name], [name]  Gender: Female  DOB: 1917-12-16  Age: 80 Years  PCP: Phoebe Sharps (Adults only)  Office Follow Up:  Does the office need to follow up with this patient?: Yes  Instructions For The Office: Low b/p, on several b/lp medications   Symptoms  Reason For Call & Symptoms: Patient calling because her b/p is lower then usual.  It has been 95/53 and 92/52 today and it usually runs 137/79 and127/87.  "I just don't feel right."  Has fatigue and wants to lie down.  Denies any chest pain or shortness of breath.  She can get up and walk to the bathroom but "I have to be careful".  Reviewed Health History In EMR: Yes  Reviewed Medications In EMR: Yes  Reviewed Allergies In EMR: Yes  Reviewed Surgeries / Procedures: Yes  Date of Onset of Symptoms: 01/08/2014  Guideline(s) Used:  Dizziness  Disposition Per Guideline:   Discuss with PCP and Callback by Nurse Today  Reason For Disposition Reached:   Taking a medicine that could cause dizziness (e.g., blood pressure medications, diuretics)  Advice Given:  N/A  Patient Will Follow Care Advice:  YES

## 2014-01-10 NOTE — Telephone Encounter (Signed)
Have her stop the amlodipine. Monitor blood pressures and call in one week with blood pressure results.

## 2014-01-16 NOTE — Telephone Encounter (Signed)
Pt aware, she will call back in a week

## 2014-06-01 ENCOUNTER — Other Ambulatory Visit: Payer: Self-pay | Admitting: Family Medicine

## 2014-06-21 ENCOUNTER — Encounter: Payer: Self-pay | Admitting: Family Medicine

## 2014-06-21 ENCOUNTER — Ambulatory Visit (INDEPENDENT_AMBULATORY_CARE_PROVIDER_SITE_OTHER): Payer: Medicare Other | Admitting: Family Medicine

## 2014-06-21 VITALS — BP 120/54 | Temp 98.3°F | Wt 124.0 lb

## 2014-06-21 DIAGNOSIS — I10 Essential (primary) hypertension: Secondary | ICD-10-CM

## 2014-06-21 DIAGNOSIS — Z23 Encounter for immunization: Secondary | ICD-10-CM

## 2014-06-21 NOTE — Progress Notes (Signed)
Carolyn Reddish, MD Phone: [phone]  Subjective:  Patient presents today to establish care with me as their new primary care provider. Patient was formerly a patient of Dr. Arnoldo Morale. Chief complaint-noted.   Patient updated me on the following Under a lot of stress recently. Husband had a fall in 2022-11-09 and he almost died. Daughter had patient and husband move in with them in an apartment at her home. Difficult to adjust to living in a smaller apartment. Trying to sell their home which is still on the market. Trying to care for 22 year old husband.   Hypertension-well controlled control, perhaps over controlled  BP Readings from Last 3 Encounters:  06/21/14 120/54  10/30/13 124/66  10/18/13 95/54   admits that intermittently she gets lightheaded especially with higher stressors or lower blood pressures. Usually worse with standing. Compliant with medications-yes  ROS-Denies any CP, HA, SOB, blurry vision, LE edema  The following were reviewed and entered/updated in epic: Past Medical History  Diagnosis Date  . Hypertension   . Osteopenia    Patient Active Problem List   Diagnosis Date Noted  . Essential hypertension 06/22/2006    Priority: Medium  . SPINAL STENOSIS, CERVICAL 06/11/2010    Priority: Low  . Arthritis of right knee 06/22/2006    Priority: Low  . Osteopenia 06/22/2006    Priority: Low   Past Surgical History  Procedure Laterality Date  . Cataract extraction      Family History  Problem Relation Age of Onset  . Cancer Daughter     breast    Medications- reviewed and updated Current Outpatient Prescriptions  Medication Sig Dispense Refill  . amLODipine (NORVASC) 5 MG tablet TAKE 1 TABLET BY MOUTH EVERY DAY  90 tablet  3  . Biotin 5000 MCG CAPS Take 1 capsule by mouth daily.      . fish oil-omega-3 fatty acids 1000 MG capsule Take 2 g by mouth daily.        Marland Kitchen labetalol (NORMODYNE) 200 MG tablet TAKE 1 TABLET TWICE A DAY  180 tablet  1  .  losartan-hydrochlorothiazide (HYZAAR) 100-25 MG per tablet TAKE 1 TABLET BY MOUTH EVERY DAY  90 tablet  1  . Multiple Vitamin (MULTIVITAMIN) tablet Take 1 tablet by mouth daily.        . traMADol (ULTRAM) 50 MG tablet Take 1 tablet (50 mg total) by mouth every 8 (eight) hours as needed.  30 tablet  1   No current facility-administered medications for this visit.    Allergies-reviewed and updated Allergies  Allergen Reactions  . Ace Inhibitors     REACTION: angioedema    History   Social History  . Marital Status: Married    Spouse Name: N/A    Number of Children: N/A  . Years of Education: N/A   Social History Main Topics  . Smoking status: Never Smoker   . Smokeless tobacco: None  . Alcohol Use:   . Drug Use:   . Sexual Activity:    Other Topics Concern  . None   Social History Narrative   Married (husband Tom age 34 in [place] practice) 25. 2 [place] oldest daughter to breast cancer. 4 grandkids.       Husband and patient live with daughter.       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       Hobbies: reading, had been quilting, enjoys cooking/baking.       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  Full Code.              ROS--See HPI   Objective: BP 120/54  Temp(Src) 98.3 F (36.8 C)  Wt 124 lb (56.246 kg) Gen: NAD, resting comfortably, appears younger than stated age HEENT: Mucous membranes are moist.  CV: RRR no murmurs rubs or gallops, occasional ectopic beat Lungs: CTAB no crackles, wheeze, rhonchi Abdomen: soft/nontender/nondistended/normal bowel sounds.  Ext: trace edema Skin: warm, dry, no rash Neuro: grossly normal, moves all extremities   Assessment/Plan:  Essential hypertension Intermittent possibly orthostatic symptoms. At age 80 with no history of MI or CVA no clear indication to keep blood pressure so tightly controlled. Decrease amlodipine to 2.5 mg and continue labetalol and losartan hydrochlorothiazide.  Follow-up in 1-2 weeks. Hopeful can DC amlodipine completely

## 2014-06-21 NOTE — Patient Instructions (Addendum)
Great to meet you!   Let's cut your amlodipine to 2.5 mg (1/2 pill) to see if that helps with your intermittent lightheadedness. Check your blood pressure once a day and come back and see [place] in 1-2 weeks so we can recheck your blood pressure.   Health Maintenance Due  Topic Date Due  . Tetanus/tdap  10/07/1936  . Influenza Vaccine -today 03/24/2014   Let's give you the final pneumonia shot in 1 month or so

## 2014-06-21 NOTE — Assessment & Plan Note (Signed)
Intermittent possibly orthostatic symptoms. At age 80 with no history of MI or CVA no clear indication to keep blood pressure so tightly controlled. Decrease amlodipine to 2.5 mg and continue labetalol and losartan hydrochlorothiazide. Follow-up in 1-2 weeks. Hopeful can DC amlodipine completely

## 2014-07-18 ENCOUNTER — Emergency Department (HOSPITAL_BASED_OUTPATIENT_CLINIC_OR_DEPARTMENT_OTHER): Payer: Medicare Other

## 2014-07-18 ENCOUNTER — Encounter (HOSPITAL_BASED_OUTPATIENT_CLINIC_OR_DEPARTMENT_OTHER): Payer: Self-pay | Admitting: *Deleted

## 2014-07-18 ENCOUNTER — Emergency Department (HOSPITAL_BASED_OUTPATIENT_CLINIC_OR_DEPARTMENT_OTHER)
Admission: EM | Admit: 2014-07-18 | Discharge: 2014-07-18 | Disposition: A | Discharge status: Home / Self Care | Payer: Medicare Other | Attending: Emergency Medicine | Admitting: Emergency Medicine

## 2014-07-18 DIAGNOSIS — Y929 Unspecified place or not applicable: Secondary | ICD-10-CM | POA: Diagnosis not present

## 2014-07-18 DIAGNOSIS — S6992XA Unspecified injury of left wrist, hand and finger(s), initial encounter: Secondary | ICD-10-CM | POA: Diagnosis present

## 2014-07-18 DIAGNOSIS — Z79899 Other long term (current) drug therapy: Secondary | ICD-10-CM | POA: Diagnosis not present

## 2014-07-18 DIAGNOSIS — Y998 Other external cause status: Secondary | ICD-10-CM | POA: Diagnosis not present

## 2014-07-18 DIAGNOSIS — S52501A Unspecified fracture of the lower end of right radius, initial encounter for closed fracture: Secondary | ICD-10-CM | POA: Diagnosis not present

## 2014-07-18 DIAGNOSIS — I10 Essential (primary) hypertension: Secondary | ICD-10-CM | POA: Insufficient documentation

## 2014-07-18 DIAGNOSIS — Z8739 Personal history of other diseases of the musculoskeletal system and connective tissue: Secondary | ICD-10-CM | POA: Diagnosis not present

## 2014-07-18 DIAGNOSIS — Y9389 Activity, other specified: Secondary | ICD-10-CM | POA: Insufficient documentation

## 2014-07-18 DIAGNOSIS — W1839XA Other fall on same level, initial encounter: Secondary | ICD-10-CM | POA: Diagnosis not present

## 2014-07-18 DIAGNOSIS — S52502A Unspecified fracture of the lower end of left radius, initial encounter for closed fracture: Secondary | ICD-10-CM

## 2014-07-18 DIAGNOSIS — W19XXXA Unspecified fall, initial encounter: Secondary | ICD-10-CM

## 2014-07-18 MED ORDER — HYDROCODONE-ACETAMINOPHEN 5-325 MG PO TABS: 0.5000 | ORAL_TABLET | ORAL | Status: DC | PRN

## 2014-07-18 MED ORDER — HYDROCODONE-ACETAMINOPHEN 5-325 MG PO TABS
0.5000 | ORAL_TABLET | Freq: Once | ORAL | Status: AC
  Administered 2014-07-18: 0.5 via ORAL
  Filled 2014-07-18: qty 1

## 2014-07-18 NOTE — ED Notes (Signed)
EMT applying splint

## 2014-07-18 NOTE — ED Provider Notes (Signed)
CSN: [id number]     Arrival date & time 07/18/14  0912 History   First MD Initiated Contact with Patient 07/18/14 [phone]     Chief Complaint  Patient presents with  . Hand Injury     (Consider location/radiation/quality/duration/timing/severity/associated sxs/prior Treatment) HPI Comments: Patient presents to the ER for evaluation of right hand and wrist injury. Patient reports that she lifted a 12 pack of beer and try to put it in her car after shopping. The beer caused her to lose her balance and she fell up against the car, bracing herself with her right arm. She reports that it did not hurt much initially. After she got home and tried to put groceries away she noticed pain with lifting groceries. She reports that she slept through the night without noticing much pain, but upon awakening today the area is bruised, swollen and very painful. She did not hit her head or lose consciousness. There was no chest pain, shortness of breath, dizziness, passing out.  Patient is a 80 y.o. female presenting with hand injury.  Hand Injury   Past Medical History  Diagnosis Date  . Hypertension   . Osteopenia    Past Surgical History  Procedure Laterality Date  . Cataract extraction     Family History  Problem Relation Age of Onset  . Cancer Daughter     breast   History  Substance Use Topics  . Smoking status: Never Smoker   . Smokeless tobacco: Never Used  . Alcohol Use: 0.6 oz/week    1 Glasses of wine per week     Comment: 1 glass with dinner   OB History    No data available     Review of Systems  Musculoskeletal: Positive for arthralgias.  All other systems reviewed and are negative.     Allergies  Ace inhibitors  Home Medications   Prior to Admission medications   Medication Sig Start Date End Date Taking? Authorizing Provider  amLODipine (NORVASC) 5 MG tablet TAKE 1 TABLET BY MOUTH EVERY DAY 08/29/13  Yes Bruce Kendall Flack, MD  fish oil-omega-3 fatty acids 1000 MG  capsule Take 2 g by mouth daily.     Yes Historical Provider, MD  labetalol (NORMODYNE) 200 MG tablet TAKE 1 TABLET TWICE A DAY 10/24/13  Yes Bruce Kendall Flack, MD  losartan-hydrochlorothiazide (HYZAAR) 100-25 MG per tablet TAKE 1 TABLET BY MOUTH EVERY DAY 05/27/13  Yes Lisabeth Pick, MD  Multiple Vitamin (MULTIVITAMIN) tablet Take 1 tablet by mouth daily.     Yes Historical Provider, MD  traMADol (ULTRAM) 50 MG tablet Take 1 tablet (50 mg total) by mouth every 8 (eight) hours as needed. 10/30/13  Yes Eulas Post, MD  Biotin 5000 MCG CAPS Take 1 capsule by mouth daily.    Historical Provider, MD   BP 103/59 mmHg  Pulse 58  Temp(Src) 97.7 F (36.5 C) (Oral)  Resp 20  Ht 4\' 9"  (1.448 m)  Wt 120 lb (54.432 kg)  BMI 25.96 kg/m2  SpO2 100% Physical Exam  Constitutional: She is oriented to person, place, and time. She appears well-developed and well-nourished. No distress.  HENT:  Head: Normocephalic and atraumatic.  Right Ear: Hearing normal.  Left Ear: Hearing normal.  Nose: Nose normal.  Mouth/Throat: Oropharynx is clear and moist and mucous membranes are normal.  Eyes: Conjunctivae and EOM are normal. Pupils are equal, round, and reactive to light.  Neck: Normal range of motion. Neck supple.  Cardiovascular: Regular rhythm, S1  normal and S2 normal.  Exam reveals no gallop and no friction rub.   No murmur heard. Pulmonary/Chest: Effort normal and breath sounds normal. No respiratory distress. She exhibits no tenderness.  Abdominal: Soft. Normal appearance and bowel sounds are normal. There is no hepatosplenomegaly. There is no tenderness. There is no rebound, no guarding, no tenderness at McBurney's point and negative Murphy's sign. No hernia.  Musculoskeletal: Normal range of motion.       Right wrist: She exhibits tenderness and swelling.       Right hand: She exhibits tenderness. She exhibits normal capillary refill, no deformity and no laceration.  Neurological: She is alert and  oriented to person, place, and time. She has normal strength. No cranial nerve deficit or sensory deficit. Coordination normal. GCS eye subscore is 4. GCS verbal subscore is 5. GCS motor subscore is 6.  Skin: Skin is warm, dry and intact. Ecchymosis noted. No rash noted. No cyanosis.     Psychiatric: She has a normal mood and affect. Her speech is normal and behavior is normal. Thought content normal.  Nursing note and vitals reviewed.   ED Course  Procedures (including critical care time) Labs Review Labs Reviewed - No data to display  Imaging Review Dg Wrist Complete Right  07/18/2014   CLINICAL DATA:  Carrying a 12 pack of beer, lost balance and hit arm on side of car, wrist pain, initial encounter  EXAM: RIGHT WRIST - COMPLETE 3+ VIEW  COMPARISON:  None.  FINDINGS: There is a transverse lucency through the distal radius with impaction and posterior angulation at the fracture site. Mild irregularity of the distal ulna is noted also likely related to a undisplaced fracture. Widening of the scapholunate space is seen which may be chronic in nature. Degenerative changes of the first CMC and MCP joints are noted. Soft tissue swelling is noted.  IMPRESSION: Distal right radius and likely distal ulnar fracture.  Widening of the scapholunate space is seen which may be chronic in nature.   Electronically Signed   By: Inez Catalina M.D.   On: 07/18/2014 09:56   Dg Hand Complete Right  07/18/2014   CLINICAL DATA:  Right hand pain after hitting side of car after fall.  EXAM: RIGHT HAND - COMPLETE 3+ VIEW  COMPARISON:  None.  FINDINGS: Joint space narrowing with osteophyte formation is seen at the first carpometacarpal joint as well as the first metacarpophalangeal joint. Similar findings are also seen throughout the proximal and distal interphalangeal joints. Mildly displaced fracture of distal radius is noted.  IMPRESSION: Mildly displaced distal right radial fracture.  Degenerative changes seen  involving the first metacarpophalangeal and carpometacarpal joints, as well as the proximal and distal interphalangeal joints throughout the remaining digits, most consistent with osteoarthritis.   Electronically Signed   By: Sabino Dick M.D.   On: 07/18/2014 09:56     EKG Interpretation None      MDM   Final diagnoses:  Fall    Patient had a fall last night injuring her right wrist. X-ray confirms distal radius fracture. Patient did not have any other injury. Patient has been previously seen at [place] for left wrist fracture 3 years ago. She will be referred back to her orthopedist for further evaluation and management.  Patient did have some mild hypotension here in the ER. She had just taken her blood pressure medications prior to coming to the ER. She is asymptomatic. No intervention necessary. She did not have any dizziness or  hypotension type symptoms yesterday when she fell. She fell trying to lift a heavy object.    Orpah Greek, MD 07/18/14 [phone]

## 2014-07-18 NOTE — ED Notes (Signed)
Reports she fell yesterday while lifting 12 pack of beer and fell into car- bruising and swelling to right hand

## 2014-07-18 NOTE — ED Notes (Signed)
MD at bedside. 

## 2014-07-18 NOTE — Discharge Instructions (Signed)
Wrist Fracture A wrist fracture is a break or crack in one of the bones of your wrist. Your wrist is made up of eight small bones at the palm of your hand (carpal bones) and two long bones that make up your forearm (radius and ulna).  CAUSES   A direct blow to the wrist.  Falling on an outstretched hand.  Trauma, such as a car accident or a fall. RISK FACTORS Risk factors for wrist fracture include:   Participating in contact and high-risk sports, such as skiing, biking, and ice skating.  Taking steroid medicines.  Smoking.  Being female.  Being Caucasian.  Drinking more than three alcoholic beverages per day.  Having low or lowered bone density (osteoporosis or osteopenia).  Age. Older adults have decreased bone density.  Women who have had menopause.  History of previous fractures. SIGNS AND SYMPTOMS Symptoms of wrist fractures include tenderness, bruising, and inflammation. Additionally, the wrist may hang in an odd position or appear deformed.  DIAGNOSIS Diagnosis may include:  Physical exam.  X-ray. TREATMENT Treatment depends on many factors, including the nature and location of the fracture, your age, and your activity level. Treatment for wrist fracture can be nonsurgical or surgical.  Nonsurgical Treatment A plaster cast or splint may be applied to your wrist if the bone is in a good position. If the fracture is not in good position, it may be necessary for your health care provider to realign it before applying a splint or cast. Usually, a cast or splint will be worn for several weeks.  Surgical Treatment Sometimes the position of the bone is so far out of place that surgery is required to apply a device to hold it together as it heals. Depending on the fracture, there are a number of options for holding the bone in place while it heals, such as a cast and metal pins.  HOME CARE INSTRUCTIONS  Keep your injured wrist elevated and move your fingers as much as  possible.  Do not put pressure on any part of your cast or splint. It may break.   Use a plastic bag to protect your cast or splint from water while bathing or showering. Do not lower your cast or splint into water.  Take medicines only as directed by your health care provider.  Keep your cast or splint clean and dry. If it becomes wet, damaged, or suddenly feels too tight, contact your health care provider right away.  Do not use any tobacco products including cigarettes, chewing tobacco, or electronic cigarettes. Tobacco can delay bone healing. If you need help quitting, ask your health care provider.  Keep all follow-up visits as directed by your health care provider. This is important.  Ask your health care provider if you should take supplements of calcium and vitamins C and D to promote bone healing. SEEK MEDICAL CARE IF:   Your cast or splint is damaged, breaks, or gets wet.  You have a fever.  You have chills.  You have continued severe pain or more swelling than you did before the cast was put on. SEEK IMMEDIATE MEDICAL CARE IF:   Your hand or fingernails on the injured arm turn blue or gray, or feel cold or numb.  You have decreased feeling in the fingers of your injured arm. MAKE SURE YOU:  Understand these instructions.  Will watch your condition.  Will get help right away if you are not doing well or get worse. Document Released: 05/20/2005 Document Revised:   12/25/2013 Document Reviewed: 08/28/2011 ExitCare Patient Information 2015 [employer]. This information is not intended to replace advice given to you by your health care provider. Make sure you discuss any questions you have with your health care provider.  

## 2014-07-18 NOTE — ED Notes (Signed)
Patient transported to X-ray 

## 2014-07-18 NOTE — ED Notes (Signed)
D/c home with family member- Rx x 1 given for vicodin- pill splitter given to pt for home use- ice pack and arm sling in place over splint

## 2014-07-22 ENCOUNTER — Emergency Department (HOSPITAL_BASED_OUTPATIENT_CLINIC_OR_DEPARTMENT_OTHER)
Admission: EM | Admit: 2014-07-22 | Discharge: 2014-07-22 | Disposition: A | Discharge status: Home / Self Care | Payer: Medicare Other | Attending: Emergency Medicine | Admitting: Emergency Medicine

## 2014-07-22 ENCOUNTER — Encounter (HOSPITAL_BASED_OUTPATIENT_CLINIC_OR_DEPARTMENT_OTHER): Payer: Self-pay | Admitting: *Deleted

## 2014-07-22 DIAGNOSIS — I10 Essential (primary) hypertension: Secondary | ICD-10-CM | POA: Diagnosis not present

## 2014-07-22 DIAGNOSIS — Y998 Other external cause status: Secondary | ICD-10-CM | POA: Insufficient documentation

## 2014-07-22 DIAGNOSIS — S6000XA Contusion of unspecified finger without damage to nail, initial encounter: Secondary | ICD-10-CM | POA: Insufficient documentation

## 2014-07-22 DIAGNOSIS — S6991XA Unspecified injury of right wrist, hand and finger(s), initial encounter: Secondary | ICD-10-CM | POA: Diagnosis present

## 2014-07-22 DIAGNOSIS — Y9289 Other specified places as the place of occurrence of the external cause: Secondary | ICD-10-CM | POA: Insufficient documentation

## 2014-07-22 DIAGNOSIS — Y9389 Activity, other specified: Secondary | ICD-10-CM | POA: Diagnosis not present

## 2014-07-22 DIAGNOSIS — Z79899 Other long term (current) drug therapy: Secondary | ICD-10-CM | POA: Diagnosis not present

## 2014-07-22 DIAGNOSIS — W1839XA Other fall on same level, initial encounter: Secondary | ICD-10-CM | POA: Insufficient documentation

## 2014-07-22 DIAGNOSIS — M7989 Other specified soft tissue disorders: Secondary | ICD-10-CM

## 2014-07-22 DIAGNOSIS — Z8739 Personal history of other diseases of the musculoskeletal system and connective tissue: Secondary | ICD-10-CM | POA: Diagnosis not present

## 2014-07-22 DIAGNOSIS — Z9849 Cataract extraction status, unspecified eye: Secondary | ICD-10-CM | POA: Diagnosis not present

## 2014-07-22 NOTE — ED Notes (Signed)
States she came here on Tuesday and was dx with minor fx in right forearm. Pt has cast with arm sling. C/o bruising to right fingers and swelling. NL cap refill and nl sensation. States bruising and swelling started yesterday. Pt states she has tried to keep arm elevated.

## 2014-07-22 NOTE — Discharge Instructions (Signed)
Cryotherapy °Cryotherapy means treatment with cold. Ice or gel packs can be used to reduce both pain and swelling. Ice is the most helpful within the first 24 to 48 hours after an injury or flare-up from overusing a muscle or joint. Sprains, strains, spasms, burning pain, shooting pain, and aches can all be eased with ice. Ice can also be used when recovering from surgery. Ice is effective, has very few side effects, and is safe for most people to use. °PRECAUTIONS  °Ice is not a safe treatment option for people with: °· Raynaud phenomenon. This is a condition affecting small blood vessels in the extremities. Exposure to cold may cause your problems to return. °· Cold hypersensitivity. There are many forms of cold hypersensitivity, including: °¨ Cold urticaria. Red, itchy hives appear on the skin when the tissues begin to warm after being iced. °¨ Cold erythema. This is a red, itchy rash caused by exposure to cold. °¨ Cold hemoglobinuria. Red blood cells break down when the tissues begin to warm after being iced. The hemoglobin that carry oxygen are passed into the urine because they cannot combine with blood proteins fast enough. °· Numbness or altered sensitivity in the area being iced. °If you have any of the following conditions, do not use ice until you have discussed cryotherapy with your caregiver: °· Heart conditions, such as arrhythmia, angina, or chronic heart disease. °· High blood pressure. °· Healing wounds or open skin in the area being iced. °· Current infections. °· Rheumatoid arthritis. °· Poor circulation. °· Diabetes. °Ice slows the blood flow in the region it is applied. This is beneficial when trying to stop inflamed tissues from spreading irritating chemicals to surrounding tissues. However, if you expose your skin to cold temperatures for too long or without the proper protection, you can damage your skin or nerves. Watch for signs of skin damage due to cold. °HOME CARE INSTRUCTIONS °Follow  these tips to use ice and cold packs safely. °· Place a dry or damp towel between the ice and skin. A damp towel will cool the skin more quickly, so you may need to shorten the time that the ice is used. °· For a more rapid response, add gentle compression to the ice. °· Ice for no more than 10 to 20 minutes at a time. The bonier the area you are icing, the less time it will take to get the benefits of ice. °· Check your skin after 5 minutes to make sure there are no signs of a poor response to cold or skin damage. °· Rest 20 minutes or more between uses. °· Once your skin is numb, you can end your treatment. You can test numbness by very lightly touching your skin. The touch should be so light that you do not see the skin dimple from the pressure of your fingertip. When using ice, most people will feel these normal sensations in this order: cold, burning, aching, and numbness. °· Do not use ice on someone who cannot communicate their responses to pain, such as small children or people with dementia. °HOW TO MAKE AN ICE PACK °Ice packs are the most common way to use ice therapy. Other methods include ice massage, ice baths, and cryosprays. Muscle creams that cause a cold, tingly feeling do not offer the same benefits that ice offers and should not be used as a substitute unless recommended by your caregiver. °To make an ice pack, do one of the following: °· Place crushed ice or a   bag of frozen vegetables in a sealable plastic bag. Squeeze out the excess air. Place this bag inside another plastic bag. Slide the bag into a pillowcase or place a damp towel between your skin and the bag.  Mix 3 parts water with 1 part rubbing alcohol. Freeze the mixture in a sealable plastic bag. When you remove the mixture from the freezer, it will be slushy. Squeeze out the excess air. Place this bag inside another plastic bag. Slide the bag into a pillowcase or place a damp towel between your skin and the bag. SEEK MEDICAL CARE  IF:  You develop white spots on your skin. This may give the skin a blotchy (mottled) appearance.  Your skin turns blue or pale.  Your skin becomes waxy or hard.  Your swelling gets worse. MAKE SURE YOU:   Understand these instructions.  Will watch your condition.  Will get help right away if you are not doing well or get worse. Document Released: 04/06/2011 Document Revised: 12/25/2013 Document Reviewed: 04/06/2011 [facility] Patient Information 2015 [place], [place]. This information is not intended to replace advice given to you by your health care provider. Make sure you discuss any questions you have with your health care provider.     Cast or Splint Care Casts and splints support injured limbs and keep bones from moving while they heal. It is important to care for your cast or splint at home.  HOME CARE INSTRUCTIONS  Keep the cast or splint uncovered during the drying period. It can take 24 to 48 hours to dry if it is made of plaster. A fiberglass cast will dry in less than 1 hour.  Do not rest the cast on anything harder than a pillow for the first 24 hours.  Do not put weight on your injured limb or apply pressure to the cast until your health care provider gives you permission.  Keep the cast or splint dry. Wet casts or splints can lose their shape and may not support the limb as well. A wet cast that has lost its shape can also create harmful pressure on your skin when it dries. Also, wet skin can become infected.  Cover the cast or splint with a plastic bag when bathing or when out in the rain or snow. If the cast is on the trunk of the body, take sponge baths until the cast is removed.  If your cast does become wet, dry it with a towel or a blow dryer on the cool setting only.  Keep your cast or splint clean. Soiled casts may be wiped with a moistened cloth.  Do not place any hard or soft foreign objects under your cast or splint, such as cotton, toilet paper,  lotion, or powder.  Do not try to scratch the skin under the cast with any object. The object could get stuck inside the cast. Also, scratching could lead to an infection. If itching is a problem, use a blow dryer on a cool setting to relieve discomfort.  Do not trim or cut your cast or remove padding from inside of it.  Exercise all joints next to the injury that are not immobilized by the cast or splint. For example, if you have a long leg cast, exercise the hip joint and toes. If you have an arm cast or splint, exercise the shoulder, elbow, thumb, and fingers.  Elevate your injured arm or leg on 1 or 2 pillows for the first 1 to 3 days to decrease swelling  and pain.It is best if you can comfortably elevate your cast so it is higher than your heart. SEEK MEDICAL CARE IF:   Your cast or splint cracks.  Your cast or splint is too tight or too loose.  You have unbearable itching inside the cast.  Your cast becomes wet or develops a soft spot or area.  You have a bad smell coming from inside your cast.  You get an object stuck under your cast.  Your skin around the cast becomes red or raw.  You have new pain or worsening pain after the cast has been applied. SEEK IMMEDIATE MEDICAL CARE IF:   You have fluid leaking through the cast.  You are unable to move your fingers or toes.  You have discolored (blue or white), cool, painful, or very swollen fingers or toes beyond the cast.  You have tingling or numbness around the injured area.  You have severe pain or pressure under the cast.  You have any difficulty with your breathing or have shortness of breath.  You have chest pain. Document Released: 08/07/2000 Document Revised: 05/31/2013 Document Reviewed: 02/16/2013 [facility] Patient Information 2015 [place], [place]. This information is not intended to replace advice given to you by your health care provider. Make sure you discuss any questions you have with your health care  provider.    Wrist Fracture A wrist fracture is a break or crack in one of the bones of your wrist. Your wrist is made up of eight small bones at the palm of your hand (carpal bones) and two long bones that make up your forearm (radius and ulna).  CAUSES   A direct blow to the wrist.  Falling on an outstretched hand.  Trauma, such as a car accident or a fall. RISK FACTORS Risk factors for wrist fracture include:   Participating in contact and high-risk sports, such as skiing, biking, and ice skating.  Taking steroid medicines.  Smoking.  Being female.  Being Caucasian.  Drinking more than three alcoholic beverages per day.  Having low or lowered bone density (osteoporosis or osteopenia).  Age. Older adults have decreased bone density.  Women who have had menopause.  History of previous fractures. SIGNS AND SYMPTOMS Symptoms of wrist fractures include tenderness, bruising, and inflammation. Additionally, the wrist may hang in an odd position or appear deformed.  DIAGNOSIS Diagnosis may include:  Physical exam.  X-ray. TREATMENT Treatment depends on many factors, including the nature and location of the fracture, your age, and your activity level. Treatment for wrist fracture can be nonsurgical or surgical.  Nonsurgical Treatment A plaster cast or splint may be applied to your wrist if the bone is in a good position. If the fracture is not in good position, it may be necessary for your health care provider to realign it before applying a splint or cast. Usually, a cast or splint will be worn for several weeks.  Surgical Treatment Sometimes the position of the bone is so far out of place that surgery is required to apply a device to hold it together as it heals. Depending on the fracture, there are a number of options for holding the bone in place while it heals, such as a cast and metal pins.  HOME CARE INSTRUCTIONS  Keep your injured wrist elevated and move your  fingers as much as possible.  Do not put pressure on any part of your cast or splint. It may break.   Use a plastic bag to protect your  cast or splint from water while bathing or showering. Do not lower your cast or splint into water.  Take medicines only as directed by your health care provider.  Keep your cast or splint clean and dry. If it becomes wet, damaged, or suddenly feels too tight, contact your health care provider right away.  Do not use any tobacco products including cigarettes, chewing tobacco, or electronic cigarettes. Tobacco can delay bone healing. If you need help quitting, ask your health care provider.  Keep all follow-up visits as directed by your health care provider. This is important.  Ask your health care provider if you should take supplements of calcium and vitamins C and D to promote bone healing. SEEK MEDICAL CARE IF:   Your cast or splint is damaged, breaks, or gets wet.  You have a fever.  You have chills.  You have continued severe pain or more swelling than you did before the cast was put on. SEEK IMMEDIATE MEDICAL CARE IF:   Your hand or fingernails on the injured arm turn blue or gray, or feel cold or numb.  You have decreased feeling in the fingers of your injured arm. MAKE SURE YOU:  Understand these instructions.  Will watch your condition.  Will get help right away if you are not doing well or get worse. Document Released: 05/20/2005 Document Revised: 12/25/2013 Document Reviewed: 08/28/2011 [facility] Patient Information 2015 [place], [place]. This information is not intended to replace advice given to you by your health care provider. Make sure you discuss any questions you have with your health care provider.

## 2014-07-22 NOTE — ED Provider Notes (Signed)
CSN: [id number]     Arrival date & time 07/22/14  1027 History   First MD Initiated Contact with Patient 07/22/14 1114     Chief Complaint  Patient presents with  . Bleeding/Bruising     (Consider location/radiation/quality/duration/timing/severity/associated sxs/prior Treatment) HPI  80 year old female presents with ecchymotic appearing right fingers since last night. 5 days ago she fell and broke her right wrist. His been in a splint since then. She denies any increased swelling but has had swelling in her hand and fingers since the injury. No increase in pain in her arm, wrist, or hand. No numbness or tingling to her fingers. No new injuries. Because she noticed the bruising she was concerned return to the ER for further evaluation.  Past Medical History  Diagnosis Date  . Hypertension   . Osteopenia    Past Surgical History  Procedure Laterality Date  . Cataract extraction     Family History  Problem Relation Age of Onset  . Cancer Daughter     breast   History  Substance Use Topics  . Smoking status: Never Smoker   . Smokeless tobacco: Never Used  . Alcohol Use: 0.6 oz/week    1 Glasses of wine per week     Comment: 1 glass with dinner   OB History    No data available     Review of Systems  Musculoskeletal: Positive for joint swelling.  Skin: Positive for color change. Negative for wound.  Neurological: Negative for weakness and numbness.      Allergies  Ace inhibitors  Home Medications   Prior to Admission medications   Medication Sig Start Date End Date Taking? Authorizing Provider  amLODipine (NORVASC) 5 MG tablet TAKE 1 TABLET BY MOUTH EVERY DAY 08/29/13  Yes Bruce Kendall Flack, MD  fish oil-omega-3 fatty acids 1000 MG capsule Take 2 g by mouth daily.     Yes Historical Provider, MD  HYDROcodone-acetaminophen (NORCO/VICODIN) 5-325 MG per tablet Take 0.5 tablets by mouth every 4 (four) hours as needed for moderate pain. 07/18/14  Yes Orpah Greek,  MD  losartan-hydrochlorothiazide (HYZAAR) 100-25 MG per tablet TAKE 1 TABLET BY MOUTH EVERY DAY 05/27/13  Yes Lisabeth Pick, MD  Multiple Vitamin (MULTIVITAMIN) tablet Take 1 tablet by mouth daily.     Yes Historical Provider, MD  traMADol (ULTRAM) 50 MG tablet Take 1 tablet (50 mg total) by mouth every 8 (eight) hours as needed. 10/30/13  Yes Eulas Post, MD  Biotin 5000 MCG CAPS Take 1 capsule by mouth daily.    Historical Provider, MD  labetalol (NORMODYNE) 200 MG tablet TAKE 1 TABLET TWICE A DAY 10/24/13   Lisabeth Pick, MD   BP 137/52 mmHg  Pulse 68  Resp 18  Ht 5' (1.524 m)  Wt 120 lb (54.432 kg)  BMI 23.44 kg/m2  SpO2 98% Physical Exam  Constitutional: She is oriented to person, place, and time. She appears well-developed and well-nourished. No distress.  HENT:  Head: Normocephalic and atraumatic.  Right Ear: External ear normal.  Left Ear: External ear normal.  Nose: Nose normal.  Eyes: Right eye exhibits no discharge. Left eye exhibits no discharge.  Cardiovascular: Normal rate.   Pulmonary/Chest: Effort normal.  Abdominal: She exhibits no distension.  Neurological: She is alert and oriented to person, place, and time.  Skin: Skin is warm and dry. Ecchymosis noted. She is not diaphoretic. No cyanosis. No pallor.     Nursing note and vitals reviewed.  ED Course  Procedures (including critical care time) Labs Review Labs Reviewed - No data to display  Imaging Review No results found.   EKG Interpretation None      MDM   Final diagnoses:  Swelling of right hand    Patient's fingers have not increased in size since the injury. She has normal capillary refill, normal sensation, and normal movement to her hand. I believe that she's not elevating her hand enough and likely the ecchymosis is simply spreading. There seems to be plenty of room between her arm and a splint. This does not seem consistent with acute compartment syndrome. I reassured patient,  recommended elevation and ice, and recommended follow-up with her hand surgeon as scheduled in the next couple days.    Ephraim Hamburger, MD 07/22/14 [phone]

## 2014-07-23 ENCOUNTER — Telehealth: Payer: Self-pay | Admitting: Family Medicine

## 2014-07-23 ENCOUNTER — Ambulatory Visit (INDEPENDENT_AMBULATORY_CARE_PROVIDER_SITE_OTHER): Payer: Medicare Other | Admitting: Family Medicine

## 2014-07-23 DIAGNOSIS — Z23 Encounter for immunization: Secondary | ICD-10-CM

## 2014-07-23 NOTE — Telephone Encounter (Signed)
Per Dr. Yong Channel stop Norvasc and see him by Wednesday or Thursday.

## 2014-07-23 NOTE — Telephone Encounter (Signed)
Patient Information:  Caller Name: [name]  Phone: [phone]  Patient: [name]  Gender: Female  DOB: 09-18-1917  Age: 80 Years  PCP: Phoebe Sharps (Adults only)  Office Follow Up:  Does the office need to follow up with this patient?: Yes  Instructions For The Office: Please call daughter back at [phone]   Symptoms  Reason For Call & Symptoms: Daughter is calling and states that pt was in to see Dr Yong Channel on 06/21/2014 and was c/o dizziness and decreased Norvasc from 5 to 2.5mg ; lightheadedness is still present; last week  on 07/17/2014 she fell and broke rt wrist due to the lightheadedness; family feels that the decrease in the Norvasc is not helping; BP 113/52 at 10:00am today  Reviewed Health History In EMR: Yes  Reviewed Medications In EMR: Yes  Reviewed Allergies In EMR: Yes  Reviewed Surgeries / Procedures: Yes  Date of Onset of Symptoms: 06/21/2014  Treatments Tried: decresae in Norvasc  Treatments Tried Worked: No  Guideline(s) Used:  Dizziness  Disposition Per Guideline:   Go to ED Now (or to Office with PCP Approval)  Reason For Disposition Reached:   Severe dizziness (e.g., unable to stand, requires support to walk, feels like passing out now)  Advice Given:  Stand Up Slowly:  In the mornings, sit up for a few minutes before you stand up. That will help your blood flow make the adjustment.  Sit down or lie down if you feel dizzy.  Drink Fluids:  Drink several glasses of fruit juice, other clear fluids, or water. This will improve hydration and blood glucose. If you have a fever or have had heat exposure, make sure the fluids are cold.  Call Back If:  Passes out (faints)  You become worse.  RN Overrode Recommendation:  Document Patient  Office called and made aware; Derinda Late will let Dr Yong Channel be aware and will call daughter back with further instructions at [phone].

## 2014-07-23 NOTE — Telephone Encounter (Signed)
Left a message for return call.  

## 2014-07-24 NOTE — Telephone Encounter (Signed)
Left a message for pt to return call 

## 2014-07-25 ENCOUNTER — Ambulatory Visit (INDEPENDENT_AMBULATORY_CARE_PROVIDER_SITE_OTHER): Payer: Medicare Other | Admitting: Family Medicine

## 2014-07-25 ENCOUNTER — Encounter: Payer: Self-pay | Admitting: Family Medicine

## 2014-07-25 VITALS — BP 110/58 | HR 59 | Temp 98.2°F | Wt 121.0 lb

## 2014-07-25 DIAGNOSIS — I959 Hypotension, unspecified: Secondary | ICD-10-CM

## 2014-07-25 DIAGNOSIS — R42 Dizziness and giddiness: Secondary | ICD-10-CM

## 2014-07-25 LAB — COMPREHENSIVE METABOLIC PANEL
ALK PHOS: 49 U/L (ref 39–117)
ALT: 27 U/L (ref 0–35)
AST: 31 U/L (ref 0–37)
Albumin: 3.8 g/dL (ref 3.5–5.2)
BUN: 35 mg/dL — ABNORMAL HIGH (ref 6–23)
CO2: 29 mEq/L (ref 19–32)
CREATININE: 0.9 mg/dL (ref 0.4–1.2)
Calcium: 9.5 mg/dL (ref 8.4–10.5)
Chloride: 103 mEq/L (ref 96–112)
GFR: 58.84 mL/min — AB (ref >60.00)
Glucose, Bld: 104 mg/dL — ABNORMAL HIGH (ref 70–99)
Potassium: 3.9 mEq/L (ref 3.5–5.1)
Sodium: 139 mEq/L (ref 135–145)
Total Bilirubin: 1.4 mg/dL — ABNORMAL HIGH (ref 0.2–1.2)
Total Protein: 7.2 g/dL (ref 6.0–8.3)

## 2014-07-25 LAB — CBC WITH DIFFERENTIAL/PLATELET
BASOS ABS: 0 10*3/uL (ref 0.0–0.1)
Basophils Relative: 0.3 % (ref 0.0–3.0)
Eosinophils Absolute: 0.1 10*3/uL (ref 0.0–0.7)
Eosinophils Relative: 0.7 % (ref 0.0–5.0)
HEMATOCRIT: 37.2 % (ref 36.0–46.0)
HEMOGLOBIN: 12.4 g/dL (ref 12.0–15.0)
LYMPHS ABS: 1.3 10*3/uL (ref 0.7–4.0)
Lymphocytes Relative: 13.5 % (ref 12.0–46.0)
MCHC: 33.4 g/dL (ref 30.0–36.0)
MCV: 92.1 fl (ref 78.0–100.0)
MONO ABS: 1.1 10*3/uL — AB (ref 0.1–1.0)
MONOS PCT: 11.3 % (ref 3.0–12.0)
Neutro Abs: 7.3 10*3/uL (ref 1.4–7.7)
Neutrophils Relative %: 74.2 % (ref 43.0–77.0)
Platelets: 218 10*3/uL (ref 150.0–400.0)
RBC: 4.04 Mil/uL (ref 3.87–5.11)
RDW: 12.9 % (ref 11.5–15.5)
WBC: 9.9 10*3/uL (ref 4.0–10.5)

## 2014-07-25 LAB — TROPONIN I: TNIDX: 0 ug/L (ref 0.00–0.06)

## 2014-07-25 NOTE — Patient Instructions (Signed)
Stop the amlodipine completely.  Cut the losartan-hctz in half if able.  See me back tomorrow  Labwork today including test looking for heart strain.

## 2014-07-25 NOTE — Progress Notes (Addendum)
Garret Reddish, MD Phone: [phone]  Subjective:   [name] is a 80 y.o. year old very pleasant female patient who presents with the following:  Lightheadedness/Hypotension 06/21/14 mild lightheadedness with standing at that time. Decreased amlodipine to 2.5 mg from 5mg .  07/18/14 had a fall related to lightheadedness and had a distal radius fracture.  States dizziness started the Tuesday before thanksgiving and has persisted since that time. Trouble walking with the lightheadedness. Taking tramadol but stopped hydrocodone.   Worse in the morning seems some better in the evening. Overall trend is mildly worsening.  Amlodipine 2.5mg  in the morning along with labetalol and losartan-hctz. Has not even taken the medicine and is already lightheaded. Symptoms are definitely worsened by going from lying to sitting or sitting to standing. She denies any vertiginous symptoms.   Blood pressure varies greatly but has been as low as 90/52 this morning and at orthopedic office and as high as 165/79 in mid November. Last few days has trended on low side.   ROS-no chest pain or shortness of breath. No edema. 2 flat pillows with no recent increase. No fever/chills/ Does not feel sick in any other way.   Past Medical History- Patient Active Problem List   Diagnosis Date Noted  . Essential hypertension 06/22/2006    Priority: Medium  . SPINAL STENOSIS, CERVICAL 06/11/2010    Priority: Low  . Arthritis of right knee 06/22/2006    Priority: Low  . Osteopenia 06/22/2006    Priority: Low   Medications- reviewed and updated Current Outpatient Prescriptions  Medication Sig Dispense Refill  . amLODipine (NORVASC) 5 MG tablet TAKE 1 TABLET BY MOUTH EVERY DAY 90 tablet 3  . Biotin 5000 MCG CAPS Take 1 capsule by mouth daily.    . fish oil-omega-3 fatty acids 1000 MG capsule Take 2 g by mouth daily.      Marland Kitchen labetalol (NORMODYNE) 200 MG tablet TAKE 1 TABLET TWICE A DAY 180 tablet 1  .  losartan-hydrochlorothiazide (HYZAAR) 100-25 MG per tablet TAKE 1 TABLET BY MOUTH EVERY DAY 90 tablet 1  . Multiple Vitamin (MULTIVITAMIN) tablet Take 1 tablet by mouth daily.      Marland Kitchen HYDROcodone-acetaminophen (NORCO/VICODIN) 5-325 MG per tablet Take 0.5 tablets by mouth every 4 (four) hours as needed for moderate pain. (Patient not taking: Reported on 07/25/2014) 20 tablet 0  . traMADol (ULTRAM) 50 MG tablet Take 1 tablet (50 mg total) by mouth every 8 (eight) hours as needed. (Patient not taking: Reported on 07/25/2014) 30 tablet 1   No current facility-administered medications for this visit.    Objective: BP 110/58 mmHg  Temp(Src) 98.2 F (36.8 C)  Wt 121 lb (54.885 kg) Gen: NAD, resting comfortably in chair, right arm in cast Patient becomes very lightheaded with standing so much so that wanted to avoid orthostatics.  CV: RRR no murmurs rubs or gallops. No JVD.  Lungs: CTAB no crackles, wheeze, rhonchi.  Abdomen: soft/nontender/nondistended/normal bowel sounds.  Ext: no edema Skin: warm, dry, no rash Neuro: CN II-XII intact, sensation and reflexes normal throughout, 5/5 muscle strength in bilateral upper and lower extremities. Normal finger to nose. Normal rapid alternating movements. Did not test romberg due to stability.   EKG- sinus rhythm with rate 60, first degree av block. Normal axis. Normal intervals other than 1st degree block. Q waves in III, v1, v2 were noted on 1998 EKG. Nonspecific st-t wave changes.    Results for orders placed or performed in visit on 07/25/14 (from  the past 24 hour(s))  CBC with Differential     Status: Abnormal   Collection Time: 07/25/14 12:40 PM  Result Value Ref Range   WBC 9.9 4.0 - 10.5 K/uL   RBC 4.04 3.87 - 5.11 Mil/uL   Hemoglobin 12.4 12.0 - 15.0 g/dL   HCT 37.2 36.0 - 46.0 %   MCV 92.1 78.0 - 100.0 fl   MCHC 33.4 30.0 - 36.0 g/dL   RDW 12.9 11.5 - 15.5 %   Platelets 218.0 150.0 - 400.0 K/uL   Neutrophils Relative % 74.2 43.0 - 77.0 %     Lymphocytes Relative 13.5 12.0 - 46.0 %   Monocytes Relative 11.3 3.0 - 12.0 %   Eosinophils Relative 0.7 0.0 - 5.0 %   Basophils Relative 0.3 0.0 - 3.0 %   Neutro Abs 7.3 1.4 - 7.7 K/uL   Lymphs Abs 1.3 0.7 - 4.0 K/uL   Monocytes Absolute 1.1 (H) 0.1 - 1.0 K/uL   Eosinophils Absolute 0.1 0.0 - 0.7 K/uL   Basophils Absolute 0.0 0.0 - 0.1 K/uL  Comprehensive metabolic panel     Status: Abnormal   Collection Time: 07/25/14 12:40 PM  Result Value Ref Range   Sodium 139 135 - 145 mEq/L   Potassium 3.9 3.5 - 5.1 mEq/L   Chloride 103 96 - 112 mEq/L   CO2 29 19 - 32 mEq/L   Glucose, Bld 104 (H) 70 - 99 mg/dL   BUN 35 (H) 6 - 23 mg/dL   Creatinine, Ser 0.9 0.4 - 1.2 mg/dL   Total Bilirubin 1.4 (H) 0.2 - 1.2 mg/dL   Alkaline Phosphatase 49 39 - 117 U/L   AST 31 0 - 37 U/L   ALT 27 0 - 35 U/L   Total Protein 7.2 6.0 - 8.3 g/dL   Albumin 3.8 3.5 - 5.2 g/dL   Calcium 9.5 8.4 - 10.5 mg/dL   GFR 58.84 (L) >60.00 mL/min  Troponin I     Status: None   Collection Time: 07/25/14 12:40 PM  Result Value Ref Range   TNIDX 0.00 0.00 - 0.06 ug/l    Assessment/Plan:  Lightheadedness/Hypotension X 2 weeks slightly worsening in High risk patient given age 33. Troponin negative- not ACS but does not rule out cardiac issue completely. EKG without acute pathology- if symptoms resolve with decreasing BP medications will not pursue further workup but there is a possibility of intermittent bradycardia and could consider 24-48 hr holter monitor. Still awaiting BNP but not fluid overloaded appearing on exam. We will stop her amlodipine completely and halve losartan-hctz if possible. Follow up 1 day. Not steady enough for full orthostatics but could do lying to sitting perhaps tomorrow.   Reassuring neurological exam and doubt CVA. CBC and CMET largely unremarkable (? CKD III, mild high bilirubin).   Strict Return precautions advised and advised reasons for ED visit.   Orders Placed This Encounter   Procedures  . CBC with Differential  . Comprehensive metabolic panel    [place]  . Troponin I  . Pro b natriuretic peptide  . EKG 12-Lead

## 2014-07-25 NOTE — Telephone Encounter (Signed)
Pt had an appt today 12.2.15.

## 2014-07-26 ENCOUNTER — Encounter: Payer: Self-pay | Admitting: Family Medicine

## 2014-07-26 ENCOUNTER — Ambulatory Visit (INDEPENDENT_AMBULATORY_CARE_PROVIDER_SITE_OTHER): Payer: Medicare Other | Admitting: Family Medicine

## 2014-07-26 VITALS — BP 108/50 | Temp 98.2°F | Wt 121.0 lb

## 2014-07-26 DIAGNOSIS — R42 Dizziness and giddiness: Secondary | ICD-10-CM

## 2014-07-26 DIAGNOSIS — I959 Hypotension, unspecified: Secondary | ICD-10-CM

## 2014-07-26 MED ORDER — LOSARTAN POTASSIUM 50 MG PO TABS: 50.0000 mg | ORAL_TABLET | Freq: Every day | ORAL | Status: DC

## 2014-07-26 NOTE — Progress Notes (Signed)
Garret Reddish, MD Phone: [phone]  Subjective:   [name] is a 80 y.o. year old very pleasant female patient who presents with the following:  Lightheadedness/Hypotension  Patient at last visit yesterday had blood pressures at home in 90s over 92s. Cut losartan-hctz in half and stopped amlodipine. Patient states BP the same today and continues to feel lightheaded. Her home notes show 110/50 which is an improvement. She has a friend who is a former physical therapist who states she seems much more steady today. We reviewed her blood work from yesterday which did not show a troponin elevation, anemia, acute kidney failure, elevated white blood cells.  ROS-continues to not have chest pain or shortness of breath. Continues to not have edema or orthopnea.  Past Medical History- Patient Active Problem List   Diagnosis Date Noted  . Essential hypertension 06/22/2006    Priority: Medium  . SPINAL STENOSIS, CERVICAL 06/11/2010    Priority: Low  . Arthritis of right knee 06/22/2006    Priority: Low  . Osteopenia 06/22/2006    Priority: Low   Medications- reviewed and updated Current Outpatient Prescriptions  Medication Sig Dispense Refill  . Biotin 5000 MCG CAPS Take 1 capsule by mouth daily.    . fish oil-omega-3 fatty acids 1000 MG capsule Take 2 g by mouth daily.      Marland Kitchen labetalol (NORMODYNE) 200 MG tablet TAKE 1 TABLET TWICE A DAY 180 tablet 1  . losartan-hydrochlorothiazide (HYZAAR) 100-25 MG per tablet TAKE 1 TABLET BY MOUTH EVERY DAY 90 tablet 1  . Multiple Vitamin (MULTIVITAMIN) tablet Take 1 tablet by mouth daily.      Marland Kitchen HYDROcodone-acetaminophen (NORCO/VICODIN) 5-325 MG per tablet Take 0.5 tablets by mouth every 4 (four) hours as needed for moderate pain. (Patient not taking: Reported on 07/25/2014) 20 tablet 0  . traMADol (ULTRAM) 50 MG tablet Take 1 tablet (50 mg total) by mouth every 8 (eight) hours as needed. (Patient not taking: Reported on 07/25/2014) 30 tablet 1    No current facility-administered medications for this visit.    Objective: BP 108/50 mmHg  Temp(Src) 98.2 F (36.8 C)  Wt 121 lb (54.885 kg) Gen: NAD, resting comfortably in chair, right arm in cast Patient was actually able to stand without assistance today. She required less assistance to get onto the table as well. CV: RRR no murmurs rubs or gallops.  Lungs: CTAB no crackles, wheeze, rhonchi.  Abdomen: soft/nontender/nondistended/normal bowel sounds.  Ext: no edema Skin: warm, dry, no rash   Orthostatic blood pressures Laying 122/60 rate 69, sitting 130/60 rate 66, standing 120/62 rate 74.   Assessment/Plan:  Lightheadedness/Hypotension Symptoms improved with stopping amlodipine completely as well as cutting losartan hydrochlorothiazide 100-25 milligrams in half. We will further decrease her blood pressure medicines as her blood pressure is 108/50 day. Orthostatics were negative the patient may just not be able to tolerate this low blood pressure. I believe we will need to tolerate up to 150/90. We will stop Hydrochlorothiazide completely and continue patient on losartan 50 mg alone. She informs me she is only taking labetalol (for at least a month) once a day so we will also stop this medication.  Family friend who is a former physical therapist will assist patient tomorrow getting blood pressures. Patient will phone in her blood pressure now she is feeling tomorrow. Will make determination if we should see her next week at that time. If she continues to have symptoms at that time we discussed getting an echocardiogram and  a 24- 48-hour Holter monitor.  Strict Return precautions advised and advised reasons for ED visit.

## 2014-07-26 NOTE — Patient Instructions (Addendum)
Blood Pressure/lightheadedness Losartan 50mg  daily. STOP the combination losartan-hydrochlorothiazide. STOP the labetalol.  Call me tomorrow to let me know how you are feeling. I may cut the losartan out or down to 25mg .   If symptoms persist, see me Monday or Tuesday and we will consider further workup such as echocardiogram and holter monitor

## 2014-07-27 ENCOUNTER — Telehealth: Payer: Self-pay | Admitting: Family Medicine

## 2014-07-27 MED ORDER — LABETALOL HCL 100 MG PO TABS: 100.0000 mg | ORAL_TABLET | Freq: Two times a day (BID) | ORAL | Status: DC

## 2014-07-27 NOTE — Telephone Encounter (Signed)
Restart labetalol but at 100mg  twice a day (if she can cut pill in half that is fine but I also called in 100mg  pills).   Keba-please find out if she is lightheaded. Since we are still not quite where we want to be (slightly high now) let's have patient back on Monday or Tuesday

## 2014-07-27 NOTE — Telephone Encounter (Signed)
Pt daughter notified   

## 2014-07-27 NOTE — Telephone Encounter (Signed)
Pt's daughter calling per your instructions to get these readings to your before noon.  BP readings  laying    p 70     BP  149/75 sititing    p72      BP   151/74 Standing p 72    BP   159/80

## 2014-07-30 ENCOUNTER — Telehealth: Payer: Self-pay | Admitting: Family Medicine

## 2014-07-30 LAB — PRO B NATRIURETIC PEPTIDE: Pro B Natriuretic peptide (BNP): 562.8 pg/mL — ABNORMAL HIGH

## 2014-07-30 NOTE — Telephone Encounter (Signed)
FYI Dr. Hunter  

## 2014-07-30 NOTE — Telephone Encounter (Signed)
Pt is calling to report bp reading 12-4 83/52 10 am 122/62 12 noon 138/56 4 pm and 162/76 at 530 pm. 116/54 8 pm, 110/54 805 pm 113/45 815 pm. And on 12-5 122/62 12 noon 138/56 4 pm 162/76 530 pm . 12-6 156/80 9 am, 130/68 11am , 166/83 1pm 161/63 at 330 pm, 159/68 at 830 pm 150/64 at 930 pm and 12-7 150/64 930 am

## 2014-07-30 NOTE — Telephone Encounter (Signed)
Keba-at last phone call I asked you to "Keba-please find out if she is lightheaded. Since we are still not quite where we want to be (slightly high now) let's have patient back on Monday or Tuesday".   There was no information on that phone call other than "patient informed". Please ask patient this same question as it is important in deciding follow up. If she is not feeling lightheaded, then have her continue current medications. We will tolerate some occasional highs.

## 2014-07-31 NOTE — Telephone Encounter (Signed)
Unable to reach pt at this time, will CB later.

## 2014-08-01 NOTE — Telephone Encounter (Signed)
Pt states she is doing ok and wants to know if you want her to continue to take the full Losartan in the morning and half Lebatatol in the morning and the other half at night. Please advise.

## 2014-08-01 NOTE — Telephone Encounter (Signed)
BP last night was 131/50 and she says it seems to go up and down with the half pill in the morning and the other half at night.

## 2014-08-01 NOTE — Telephone Encounter (Signed)
Pt notified and declines lightheadedness

## 2014-08-01 NOTE — Telephone Encounter (Signed)
What does "ok" mean? Is she having any lightheadedness specifically?.  If she is not, continue losartan 40mg  and labetalol 100mg  twice a day. Seems this has stabilized her. She can follow up in office with me if she has any more lightheadedness or questions about blood pressure.

## 2014-08-06 ENCOUNTER — Telehealth: Payer: Self-pay | Admitting: Family Medicine

## 2014-08-06 NOTE — Telephone Encounter (Signed)
[place] occupational therapist from [facility] would like a verbal order for pt to have nurse visit  to look  at open blister on toe. Fax [phone] attn kay . Pt bp was 160/70 on 08/03/14

## 2014-08-06 NOTE — Telephone Encounter (Signed)
Technically no, she would need a face to face for RN services as I have not evaluated the blister. She would need to come in for me to look at it unfortunately (i know she is tired of coming in).   BP is variable. Biggest thing for her is that don't want her to be so lightheaded so we will tolerate running slightly high. If gets above 160, needs to come in.

## 2014-08-06 NOTE — Telephone Encounter (Signed)
Pt has been sch for 12-16

## 2014-08-06 NOTE — Telephone Encounter (Signed)
Mrs. [name] please call pt and have her come in to have blister evaluated per Dr. Yong Channel

## 2014-08-06 NOTE — Telephone Encounter (Signed)
Is this ok to give? Is BP ok?

## 2014-08-08 ENCOUNTER — Ambulatory Visit: Payer: Medicare Other | Admitting: Family Medicine

## 2014-08-10 ENCOUNTER — Ambulatory Visit (INDEPENDENT_AMBULATORY_CARE_PROVIDER_SITE_OTHER): Payer: Medicare Other | Admitting: Family Medicine

## 2014-08-10 ENCOUNTER — Encounter: Payer: Self-pay | Admitting: Family Medicine

## 2014-08-10 VITALS — BP 122/60 | HR 65 | Temp 98.3°F | Wt 120.0 lb

## 2014-08-10 DIAGNOSIS — S90424A Blister (nonthermal), right lesser toe(s), initial encounter: Secondary | ICD-10-CM

## 2014-08-10 DIAGNOSIS — I10 Essential (primary) hypertension: Secondary | ICD-10-CM

## 2014-08-10 NOTE — Assessment & Plan Note (Signed)
Much improved control now on only 50 mg losartan and labetalol 100 mg twice a day. This is down from amlodipine 5 mg as well as chlorothiazide 25 mg and a higher dose of losartan. Her blood pressure is well controlled today and her lightheadedness is much improved. She does have some variations at home but without major lightheadedness with lower numbers and no symptoms with higher numbers. Continue current dosing.

## 2014-08-10 NOTE — Patient Instructions (Signed)
I think blood pressure is in a much better place. Let's continue current medicine. Check Monday Wednesday Friday instead of 4x a day AND also check anytime you feel extra lightheaded. I am glad this is better though.   Sorry we do not have little donuts to offload pressure but I would pick one up from the drugstore. Can use vaseline or neosporin on inside of donut. Let me know within 2-3 weeks if we are not improving or definitely sooner if worsens and I can put in a home health order with the RN to help you.

## 2014-08-10 NOTE — Progress Notes (Signed)
  Garret Reddish, MD Phone: [phone]  Subjective:   [name] is a 80 y.o. year old very pleasant female patient who presents with the following:  Hypertension-well controlled  BP Readings from Last 3 Encounters:  08/10/14 122/60  07/26/14 108/50  07/25/14 110/58  Dizziness/lightheadedness are much improved now. Home BP monitoring-yes 4 times a day ranging from 90/50-165/90. High blood pressure is often common at night after eating processed foods. Compliant with medications-yes with much improved side effect profile ROS-Denies any CP, HA, SOB, blurry vision   Right 2nd toe blister- new Occurred after she wore some shoes that were narrow. She has known history of bunions and hammering of her second toe on the right foot. When her foot rolled on top of her shoe creating a blister. This is been going on for week or 2. Home health RN has requested an evaluation through home health services but I had not had a face-to-face encounter relating to this previously  ROS-No expanding redness or pain. No fevers or chills  Past Medical History- Patient Active Problem List   Diagnosis Date Noted  . Essential hypertension 06/22/2006    Priority: Medium  . SPINAL STENOSIS, CERVICAL 06/11/2010    Priority: Low  . Arthritis of right knee 06/22/2006    Priority: Low  . Osteopenia 06/22/2006    Priority: Low   Medications- reviewed and updated Current Outpatient Prescriptions  Medication Sig Dispense Refill  . fish oil-omega-3 fatty acids 1000 MG capsule Take 2 g by mouth daily.      Marland Kitchen labetalol (NORMODYNE) 100 MG tablet Take 1 tablet (100 mg total) by mouth 2 (two) times daily. 60 tablet 1  . losartan (COZAAR) 50 MG tablet Take 1 tablet (50 mg total) by mouth daily. 30 tablet 0  . Multiple Vitamin (MULTIVITAMIN) tablet Take 1 tablet by mouth daily.      . traMADol (ULTRAM) 50 MG tablet Take 1 tablet (50 mg total) by mouth every 8 (eight) hours as needed. 30 tablet 1   No current  facility-administered medications for this visit.    Objective: BP 122/60 mmHg  Pulse 65  Temp(Src) 98.3 F (36.8 C)  Wt 120 lb (54.432 kg) Gen: NAD, resting comfortably, thin elderly, right arm in brace CV: RRR no murmurs rubs or gallops Lungs: CTAB no crackles, wheeze, rhonchi Abdomen: soft/nontender/nondistended/normal bowel sounds.  Ext: no edema R 2nd toe with hammertoe deformity due to bunion and 3 mm x 3 mm ulceration just proximal to PIP joint. No surounding erythema.  Neuro: grossly normal, moves all extremities   Assessment/Plan:  Essential hypertension Much improved control now on only 50 mg losartan and labetalol 100 mg twice a day. This is down from amlodipine 5 mg as well as chlorothiazide 25 mg and a higher dose of losartan. Her blood pressure is well controlled today and her lightheadedness is much improved. She does have some variations at home but without major lightheadedness with lower numbers and no symptoms with higher numbers. Continue current dosing.  Right 2nd toe blister superficial without infection Advised patient to wear wide toe shoes. She is also to get a corn donut to prevent her ulceration from rubbing on the top of her foot. She will use Vaseline or Neosporin to lightly coat the area. If this does not improve within 1-2 weeks we will plan on a home health RN evaluation for continued wound care.  Return precautions advised. 3 month follow up planned.

## 2014-08-20 ENCOUNTER — Other Ambulatory Visit: Payer: Self-pay | Admitting: Family Medicine

## 2014-08-27 DIAGNOSIS — I10 Essential (primary) hypertension: Secondary | ICD-10-CM | POA: Diagnosis not present

## 2014-08-27 DIAGNOSIS — Z9181 History of falling: Secondary | ICD-10-CM | POA: Diagnosis not present

## 2014-08-27 DIAGNOSIS — R2689 Other abnormalities of gait and mobility: Secondary | ICD-10-CM | POA: Diagnosis not present

## 2014-08-27 DIAGNOSIS — S52531D Colles' fracture of right radius, subsequent encounter for closed fracture with routine healing: Secondary | ICD-10-CM | POA: Diagnosis not present

## 2014-08-30 DIAGNOSIS — R2689 Other abnormalities of gait and mobility: Secondary | ICD-10-CM | POA: Diagnosis not present

## 2014-08-30 DIAGNOSIS — S52531D Colles' fracture of right radius, subsequent encounter for closed fracture with routine healing: Secondary | ICD-10-CM | POA: Diagnosis not present

## 2014-08-30 DIAGNOSIS — Z9181 History of falling: Secondary | ICD-10-CM | POA: Diagnosis not present

## 2014-08-30 DIAGNOSIS — I10 Essential (primary) hypertension: Secondary | ICD-10-CM | POA: Diagnosis not present

## 2014-09-04 DIAGNOSIS — S52531D Colles' fracture of right radius, subsequent encounter for closed fracture with routine healing: Secondary | ICD-10-CM | POA: Diagnosis not present

## 2014-09-21 ENCOUNTER — Other Ambulatory Visit: Payer: Self-pay | Admitting: Family Medicine

## 2014-11-05 ENCOUNTER — Telehealth: Payer: Self-pay | Admitting: Family Medicine

## 2014-11-05 NOTE — Telephone Encounter (Signed)
Yes she will need Td. She can be seen for this specifically today or tomorrow if she desires.

## 2014-11-05 NOTE — Telephone Encounter (Signed)
[facility] Patient Name: [name] DOB: 1917-11-28 Initial Comment Caller states she fell this past Friday, hitting head and shoulder - c/o soreness all over Nurse Assessment Nurse: Donalynn Furlong, RN, Myna Hidalgo Date/Time Eilene Ghazi Time): 11/05/2014 10:40:30 AM Confirm and document reason for call. If symptomatic, describe symptoms. ---Caller states she fell this past Friday, hitting head and shoulder - c/o soreness all over. Fell "flat on my face". This was witnessed. Also dragged left elbow on ground , no deformity or cuts sustained. Area is described as abraded. Was not seen by anyone for this. States she did not lose consciousness. No facial cuts, etc, small amount facial bruising. States she also has red mark on her right knee. All areas washed with soap and water, dried and Neosporin applied. Pt states arms/legs without any residual difficulty with function/use/sensation. No deformities or exquisite pain. Pt going through activities of daily living without difficulty. No headache, visual changes, vomiting etc Has the patient traveled out of the country within the last 30 days? ---No Does the patient require triage? ---Yes Related visit to physician within the last 2 weeks? ---No Does the PT have any chronic conditions? (i.e. diabetes, asthma, etc.) ---Yes List chronic conditions. ---high blood pressure Guidelines Guideline Title Affirmed Question Affirmed Notes Head Injury ALSO, superficial cut (scratch) or abrasion (scrape) is present Final Disposition User [place], RN, Myna Hidalgo Comments pt states she has an appt this Friday 3/18 w Dr Yong Channel at 2:15. Pt is calling office to check on her last Tetanus Booster as well.

## 2014-11-05 NOTE — Telephone Encounter (Signed)
FYI and pt is due for Tetanus, don't see where she has had one.

## 2014-11-09 ENCOUNTER — Ambulatory Visit (INDEPENDENT_AMBULATORY_CARE_PROVIDER_SITE_OTHER)
Admission: RE | Admit: 2014-11-09 | Discharge: 2014-11-09 | Disposition: A | Discharge status: Home / Self Care | Payer: Medicare Other | Source: Ambulatory Visit | Attending: Family Medicine | Admitting: Family Medicine

## 2014-11-09 ENCOUNTER — Ambulatory Visit (INDEPENDENT_AMBULATORY_CARE_PROVIDER_SITE_OTHER): Payer: Medicare Other | Admitting: Family Medicine

## 2014-11-09 ENCOUNTER — Other Ambulatory Visit: Payer: Self-pay | Admitting: Family Medicine

## 2014-11-09 VITALS — BP 140/60 | HR 83 | Temp 98.0°F | Wt 121.0 lb

## 2014-11-09 DIAGNOSIS — W19XXXA Unspecified fall, initial encounter: Secondary | ICD-10-CM

## 2014-11-09 DIAGNOSIS — Z23 Encounter for immunization: Secondary | ICD-10-CM | POA: Diagnosis not present

## 2014-11-09 DIAGNOSIS — R29810 Facial weakness: Secondary | ICD-10-CM | POA: Diagnosis not present

## 2014-11-09 DIAGNOSIS — R260 Ataxic gait: Secondary | ICD-10-CM

## 2014-11-09 DIAGNOSIS — S0083XA Contusion of other part of head, initial encounter: Secondary | ICD-10-CM | POA: Diagnosis not present

## 2014-11-09 DIAGNOSIS — R42 Dizziness and giddiness: Secondary | ICD-10-CM | POA: Diagnosis not present

## 2014-11-09 DIAGNOSIS — I10 Essential (primary) hypertension: Secondary | ICD-10-CM | POA: Diagnosis not present

## 2014-11-10 ENCOUNTER — Encounter: Payer: Self-pay | Admitting: Family Medicine

## 2014-11-10 DIAGNOSIS — R42 Dizziness and giddiness: Secondary | ICD-10-CM | POA: Insufficient documentation

## 2014-11-10 NOTE — Addendum Note (Signed)
Addended by: Marin Olp on: 11/10/2014 02:51 PM   Modules accepted: Level of Service

## 2014-11-10 NOTE — Assessment & Plan Note (Signed)
Normal neurological exam except for limited smile and frown and L eye slightly closed compared to right. Patient has a history of bell's palsy and is unclear of her own baseline and this may truly not be new deficit. We discussed the episode could have been due to arrhythmia or CVA or TIA. We discussed exhaustive workup with MRI, echo, carotid dopplers, cardiac monitoring but with this being first episode and patient possibly having been slightly dehydrated (daughter states does not drink a lot) and has had episodes like this in past with turnign head related to cervical stenosis-we held off on exhaustive workup.   We did agree to pursue CT head noncontrast to rule out subdural either as cause or result of fall. No obvious CVA but obvious not as sensitive as MRI. We agreed to start a baby aspirin 81mg  every other day for potential CVA protection but would pursue more in depth workup if recurrent.

## 2014-11-10 NOTE — Progress Notes (Addendum)
Garret Reddish, MD Phone: [phone]  Subjective:   [name] is a 81 y.o. year old very pleasant female patient who presents with the following:  Acute dizziness/staggering leading to fall against a wall Patient was in the grocery store 1 week ago and was about to go out [place] car. She was holding onto the shopping cart when she all of the sudden became dizzy. She let go of the cart. She started staggering and eventually fell towards a wall where she hit her right forehead. She sat on the floor for 10-15 minutes and then some ladies helped drive her home. She felt slightly dizzy probably for abotu 30 minutes but then completely resolved.   She has had some dizziness in the past and we went down on her BP meds. She has done well with the decrease. Since her fall she states maybe occasionally lightheaded but minimal. Her daughter mentions she does have a history of cervical stenosis and sometimes if she turns her head quickly she will get off balance or stagger for short period as tries to regain balance. Possible this happened at store.   ROS- During the episode and afterwards denies any chest pain, shortness of breath, nausea, diaphoresis. No blurry vision, no facial weakness, no slurred speech, no extremity weakness, no difficulty swallowing.   Hypertension-very mild poor control but reasonable for age  BP Readings from Last 3 Encounters:  11/10/14 140/60  08/10/14 122/60  07/26/14 108/50   Home BP monitoring-no, usually 120s or 130s with controlled diastolic Compliant with medications-yes without side effects, losartan and labetalol ROS-Denies any CP, HA, SOB, blurry vision.   Past Medical History- Patient Active Problem List   Diagnosis Date Noted  . Essential hypertension 06/22/2006    Priority: Medium  . SPINAL STENOSIS, CERVICAL 06/11/2010    Priority: Low  . Arthritis of right knee 06/22/2006    Priority: Low  . Osteopenia 06/22/2006    Priority: Low    Medications- reviewed and updated Current Outpatient Prescriptions  Medication Sig Dispense Refill  . fish oil-omega-3 fatty acids 1000 MG capsule Take 2 g by mouth daily.      Marland Kitchen labetalol (NORMODYNE) 100 MG tablet TAKE 1 TABLET BY MOUTH TWICE A DAY 60 tablet 1  . losartan (COZAAR) 50 MG tablet TAKE 1 TABLET BY MOUTH EVERY DAY 30 tablet 3  . Multiple Vitamin (MULTIVITAMIN) tablet Take 1 tablet by mouth daily.      . traMADol (ULTRAM) 50 MG tablet Take 1 tablet (50 mg total) by mouth every 8 (eight) hours as needed. 30 tablet 1   Objective: BP 140/60 mmHg  Pulse 83  Temp(Src) 98 F (36.7 C)  Wt 121 lb (54.885 kg) Gen: NAD, resting comfortably in chair, moves very cautiously to table but able to climb on without assist.  HEENT: bruising left forehead  CV: RRR no mrg  Lungs: CTAB  Abd: soft/nontender/nondistended/normal bowel sounds  Ext: abrasion left elbow MSK: moves all extremities, no edema  Skin: warm and dry, no rash  Neuro: CN II-XII intact with exception of Limited smile and frown on left side and L eye slightly closed (history of bells palsy and unclear baseline), sensation and reflexes normal throughout, 5/5 muscle strength in bilateral upper and lower extremities. Normal finger to nose. Normal rapid alternating movements. Normal romberg. No pronator drift. Normal gait  Ct Head Wo Contrast  11/09/2014   CLINICAL DATA:  Status post fall, slight bruising to left forehead  EXAM: CT HEAD WITHOUT CONTRAST  TECHNIQUE: Contiguous axial images were obtained from the base of the skull through the vertex without intravenous contrast.  COMPARISON:  None.  FINDINGS: No evidence of parenchymal hemorrhage or extra-axial fluid collection. No mass lesion, mass effect, or midline shift.  No CT evidence of acute infarction.  Subcortical white matter and periventricular small vessel ischemic changes. Intracranial atherosclerosis.  Global cortical atrophy.  No ventriculomegaly.  The visualized  paranasal sinuses are essentially clear. The mastoid air cells are unopacified.  No evidence of calvarial fracture.  IMPRESSION: No evidence of acute intracranial abnormality.  Atrophy with small vessel ischemic changes.   Electronically Signed   By: Julian Hy M.D.   On: 11/09/2014 16:18   Assessment/Plan:  Hypertension-very slightly poor control but would not adjust as do not want to cause any orthostatic symtpoms. <150/90 reasonable goal.   Dizzy spell/staggering leading to fall against wall Normal neurological exam except for limited smile and frown and L eye slightly closed compared to right. Patient has a history of bell's palsy and is unclear of her own baseline and this may truly not be new deficit. We discussed the episode could have been due to arrhythmia or CVA or TIA. We discussed exhaustive workup with MRI, echo, carotid dopplers, cardiac monitoring but with this being first episode and patient possibly having been slightly dehydrated (daughter states does not drink a lot) and has had episodes like this in past with turnign head related to cervical stenosis-we held off on exhaustive workup.   We did agree to pursue CT head noncontrast to rule out subdural either as cause or result of fall. No obvious CVA but obvious not as sensitive as MRI. We agreed to start a baby aspirin 81mg  every other day for potential CVA protection but would pursue more in depth workup if recurrent.     Also Td given due to abrasion. Please not epic was down on the day of visit and unable to enter order. Will be entered later by Clyde Lundborg, CMA.

## 2014-11-12 ENCOUNTER — Other Ambulatory Visit: Payer: Self-pay

## 2014-11-12 DIAGNOSIS — R42 Dizziness and giddiness: Secondary | ICD-10-CM | POA: Diagnosis not present

## 2014-11-12 DIAGNOSIS — Z23 Encounter for immunization: Secondary | ICD-10-CM | POA: Diagnosis not present

## 2014-11-12 DIAGNOSIS — W19XXXA Unspecified fall, initial encounter: Secondary | ICD-10-CM | POA: Diagnosis not present

## 2014-11-12 NOTE — Addendum Note (Signed)
Addended by: Clyde Lundborg A on: 11/12/2014 08:29 AM   Modules accepted: Orders

## 2014-11-13 ENCOUNTER — Other Ambulatory Visit: Payer: Self-pay | Admitting: Internal Medicine

## 2014-12-01 ENCOUNTER — Other Ambulatory Visit: Payer: Self-pay | Admitting: Family Medicine

## 2014-12-21 ENCOUNTER — Other Ambulatory Visit: Payer: Self-pay | Admitting: Family Medicine

## 2015-01-04 ENCOUNTER — Ambulatory Visit (INDEPENDENT_AMBULATORY_CARE_PROVIDER_SITE_OTHER): Payer: Medicare Other | Admitting: Family Medicine

## 2015-01-04 ENCOUNTER — Encounter: Payer: Self-pay | Admitting: Family Medicine

## 2015-01-04 VITALS — BP 170/78 | HR 65 | Temp 98.4°F | Wt 121.0 lb

## 2015-01-04 DIAGNOSIS — I10 Essential (primary) hypertension: Secondary | ICD-10-CM | POA: Diagnosis not present

## 2015-01-04 DIAGNOSIS — K5900 Constipation, unspecified: Secondary | ICD-10-CM | POA: Diagnosis not present

## 2015-01-04 NOTE — Patient Instructions (Signed)
Increase water to 2 bottles of the 16 oz.   Add colace over the counter once a day  Call me on Monday or Tuesday and report blood pressures over the weekend and how your bowel movements are doing. I may add some miralax at that point depending on how things are going.

## 2015-01-04 NOTE — Assessment & Plan Note (Signed)
Poor control today on Losartan 50 mg, labetalol 100 mg twice a day. States stressed by constipation issue. Home readings in recent weeks have been excellent. Did have temporary headache and blurry vision for a minute or so in office and resolved-once again as in past discussed could do extensive workup for CVA but for now will hold off. Verbal Return precautions advised in regards to headaches/blurry vision/blood pressure.

## 2015-01-04 NOTE — Progress Notes (Signed)
Garret Reddish, MD  Subjective:  [name] is a 81 y.o. year old very pleasant female patient who presents with:  Constipation -For 2 weeks has been having trouble with bowel movements. Gets the urget to go then has to force herself and does not always have a bowel movement despite straining. This morning at 3 am did have a bowel movement but then halfway through had to force herself. Still passing gas. Headache and blurry vision when she came in for just a few minutes but has improved-may have had an urge to go to bathroom but resolved. Only drinks about 16 oz water a day and few other liquids. No new medications.   ROS- no abdominal pain, nausea, vomiting, fever, no changes in weight  Hypertension-poor control  BP Readings from Last 3 Encounters:  01/04/15 170/78  11/10/14 140/60  08/10/14 122/60   Home BP monitoring-usually 130s unless difficulties with bowels then higher Compliant with medications-yes without side effects labetalol 100mg  BID, losartan 50mg  ROS-Denies any CP, HA, SOB, blurry vision, LE edema. Occasional lightheadedness if stands quickly.   Past Medical History- R knee arthritis, cervical spinal stenosis  Medications- reviewed and updated Current Outpatient Prescriptions  Medication Sig Dispense Refill  . fish oil-omega-3 fatty acids 1000 MG capsule Take 2 g by mouth daily.      Marland Kitchen labetalol (NORMODYNE) 100 MG tablet TAKE 1 TABLET BY MOUTH TWICE A DAY 60 tablet 3  . losartan (COZAAR) 50 MG tablet TAKE 1 TABLET BY MOUTH EVERY DAY 30 tablet 5  . Multiple Vitamin (MULTIVITAMIN) tablet Take 1 tablet by mouth daily.       Objective: BP 170/78 mmHg  Pulse 65  Temp(Src) 98.4 F (36.9 C)  Wt 121 lb (54.885 kg) Gen: NAD, resting comfortably in chair, able to move to table with my assist Able to read my nametag, states things are clear in the room now CV: RRR no murmurs rubs or gallops Lungs: CTAB no crackles, wheeze, rhonchi Abdomen:  soft/nontender/nondistended/normal bowel sounds. No rebound or guarding.  Ext: no edema Skin: warm, dry, no rash Neuro: 5/5 strength upper and lower extremities, alert and oriented x 3    Assessment/Plan:    Essential hypertension Poor control today on Losartan 50 mg, labetalol 100 mg twice a day. States stressed by constipation issue. Home readings in recent weeks have been excellent. Did have temporary headache and blurry vision for a minute or so in office and resolved-once again as in past discussed could do extensive workup for CVA but for now will hold off. Verbal Return precautions advised in regards to headaches/blurry vision/blood pressure.    Constipation No new meds. Not clear why has had this change. Normal abdominal exam. Will increase hydration to at least 32 oz water a day from 16 and add colace nightly  Patient to call Monday or Tuesday to update [place] on BP and constipation issues. Miralax likely next step.

## 2015-01-08 ENCOUNTER — Telehealth: Payer: Self-pay | Admitting: Family Medicine

## 2015-01-08 ENCOUNTER — Other Ambulatory Visit: Payer: Self-pay | Admitting: Family Medicine

## 2015-01-08 MED ORDER — LOSARTAN POTASSIUM 100 MG PO TABS: 100.0000 mg | ORAL_TABLET | Freq: Every day | ORAL | Status: DC

## 2015-01-08 NOTE — Telephone Encounter (Signed)
See below

## 2015-01-08 NOTE — Telephone Encounter (Signed)
NP is at pt house now and pt BP  today 170/80 with no symptoms. Please advise

## 2015-01-08 NOTE — Telephone Encounter (Signed)
Pt notified of below information and will call to schedule f/u

## 2015-01-08 NOTE — Telephone Encounter (Signed)
Increase losartan to 100mg  ( i sent in) and follow up with [place] in office in a week.

## 2015-01-15 ENCOUNTER — Telehealth: Payer: Self-pay

## 2015-01-15 NOTE — Telephone Encounter (Signed)
Let's have her monitor her blood pressures over the next week. Just check once a day then send [place] a report. Seek care if any chest pain, shortness of breath, blurry vision, headaches, extremity weakness, slurred words, trouble swallowing, facial weakness-none of which I expect her to have

## 2015-01-15 NOTE — Telephone Encounter (Signed)
Pt is calling to update [place] on her BP. Pt states yesterday her bp was 121/55 and 129/57, today her reading was 174/77 and 163/66. Pt wants to know if she needs an OV or what needs to be done from here.

## 2015-01-16 NOTE — Telephone Encounter (Signed)
Pt.notified

## 2015-02-07 ENCOUNTER — Telehealth: Payer: Self-pay

## 2015-02-07 NOTE — Telephone Encounter (Signed)
Pt.notified

## 2015-02-07 NOTE — Telephone Encounter (Signed)
For the most part these look excellent. Let's continue current medications.

## 2015-02-07 NOTE — Telephone Encounter (Signed)
Pt is calling to update [place] on BP readings 137/84, 150/67, 132/65, 134/61, 156/66, 133/67 and 138/59. She has not experienced any HA, SOB, blurred vision, chest pain or dizzines.

## 2015-03-11 DIAGNOSIS — M1711 Unilateral primary osteoarthritis, right knee: Secondary | ICD-10-CM | POA: Diagnosis not present

## 2015-03-11 DIAGNOSIS — M25561 Pain in right knee: Secondary | ICD-10-CM | POA: Diagnosis not present

## 2015-03-19 ENCOUNTER — Emergency Department (HOSPITAL_COMMUNITY): Payer: Medicare Other

## 2015-03-19 ENCOUNTER — Encounter (HOSPITAL_COMMUNITY): Payer: Self-pay | Admitting: Emergency Medicine

## 2015-03-19 ENCOUNTER — Inpatient Hospital Stay (HOSPITAL_COMMUNITY)
Admission: EM | Admit: 2015-03-19 | Discharge: 2015-03-29 | DRG: 064 | Disposition: A | Discharge status: Rehabilitation Facility | Payer: Medicare Other | Attending: Neurology | Admitting: Neurology

## 2015-03-19 DIAGNOSIS — I482 Chronic atrial fibrillation: Secondary | ICD-10-CM | POA: Diagnosis not present

## 2015-03-19 DIAGNOSIS — E46 Unspecified protein-calorie malnutrition: Secondary | ICD-10-CM | POA: Diagnosis not present

## 2015-03-19 DIAGNOSIS — G936 Cerebral edema: Secondary | ICD-10-CM | POA: Diagnosis not present

## 2015-03-19 DIAGNOSIS — E860 Dehydration: Secondary | ICD-10-CM | POA: Diagnosis not present

## 2015-03-19 DIAGNOSIS — N390 Urinary tract infection, site not specified: Secondary | ICD-10-CM | POA: Diagnosis not present

## 2015-03-19 DIAGNOSIS — D649 Anemia, unspecified: Secondary | ICD-10-CM | POA: Diagnosis not present

## 2015-03-19 DIAGNOSIS — I517 Cardiomegaly: Secondary | ICD-10-CM | POA: Diagnosis not present

## 2015-03-19 DIAGNOSIS — Z79899 Other long term (current) drug therapy: Secondary | ICD-10-CM

## 2015-03-19 DIAGNOSIS — I6931 Cognitive deficits following cerebral infarction: Secondary | ICD-10-CM | POA: Diagnosis not present

## 2015-03-19 DIAGNOSIS — I481 Persistent atrial fibrillation: Secondary | ICD-10-CM | POA: Diagnosis not present

## 2015-03-19 DIAGNOSIS — I612 Nontraumatic intracerebral hemorrhage in hemisphere, unspecified: Secondary | ICD-10-CM | POA: Diagnosis not present

## 2015-03-19 DIAGNOSIS — R569 Unspecified convulsions: Secondary | ICD-10-CM | POA: Insufficient documentation

## 2015-03-19 DIAGNOSIS — R1310 Dysphagia, unspecified: Secondary | ICD-10-CM | POA: Diagnosis not present

## 2015-03-19 DIAGNOSIS — I6200 Nontraumatic subdural hemorrhage, unspecified: Secondary | ICD-10-CM | POA: Diagnosis not present

## 2015-03-19 DIAGNOSIS — G9340 Encephalopathy, unspecified: Secondary | ICD-10-CM | POA: Diagnosis not present

## 2015-03-19 DIAGNOSIS — I611 Nontraumatic intracerebral hemorrhage in hemisphere, cortical: Secondary | ICD-10-CM | POA: Diagnosis not present

## 2015-03-19 DIAGNOSIS — Z8679 Personal history of other diseases of the circulatory system: Secondary | ICD-10-CM | POA: Insufficient documentation

## 2015-03-19 DIAGNOSIS — Z452 Encounter for adjustment and management of vascular access device: Secondary | ICD-10-CM | POA: Diagnosis not present

## 2015-03-19 DIAGNOSIS — R4182 Altered mental status, unspecified: Secondary | ICD-10-CM | POA: Diagnosis not present

## 2015-03-19 DIAGNOSIS — J189 Pneumonia, unspecified organism: Secondary | ICD-10-CM | POA: Diagnosis not present

## 2015-03-19 DIAGNOSIS — M1711 Unilateral primary osteoarthritis, right knee: Secondary | ICD-10-CM | POA: Diagnosis not present

## 2015-03-19 DIAGNOSIS — Z888 Allergy status to other drugs, medicaments and biological substances status: Secondary | ICD-10-CM | POA: Diagnosis not present

## 2015-03-19 DIAGNOSIS — R531 Weakness: Secondary | ICD-10-CM | POA: Diagnosis not present

## 2015-03-19 DIAGNOSIS — I610 Nontraumatic intracerebral hemorrhage in hemisphere, subcortical: Secondary | ICD-10-CM | POA: Diagnosis not present

## 2015-03-19 DIAGNOSIS — G8194 Hemiplegia, unspecified affecting left nondominant side: Secondary | ICD-10-CM | POA: Diagnosis not present

## 2015-03-19 DIAGNOSIS — I509 Heart failure, unspecified: Secondary | ICD-10-CM | POA: Diagnosis not present

## 2015-03-19 DIAGNOSIS — I480 Paroxysmal atrial fibrillation: Secondary | ICD-10-CM | POA: Diagnosis not present

## 2015-03-19 DIAGNOSIS — R918 Other nonspecific abnormal finding of lung field: Secondary | ICD-10-CM | POA: Diagnosis not present

## 2015-03-19 DIAGNOSIS — D72829 Elevated white blood cell count, unspecified: Secondary | ICD-10-CM

## 2015-03-19 DIAGNOSIS — R471 Dysarthria and anarthria: Secondary | ICD-10-CM | POA: Diagnosis present

## 2015-03-19 DIAGNOSIS — G935 Compression of brain: Secondary | ICD-10-CM

## 2015-03-19 DIAGNOSIS — R404 Transient alteration of awareness: Secondary | ICD-10-CM | POA: Diagnosis not present

## 2015-03-19 DIAGNOSIS — I10 Essential (primary) hypertension: Secondary | ICD-10-CM | POA: Diagnosis present

## 2015-03-19 DIAGNOSIS — E876 Hypokalemia: Secondary | ICD-10-CM | POA: Diagnosis not present

## 2015-03-19 DIAGNOSIS — Z66 Do not resuscitate: Secondary | ICD-10-CM | POA: Diagnosis present

## 2015-03-19 DIAGNOSIS — Z7982 Long term (current) use of aspirin: Secondary | ICD-10-CM | POA: Diagnosis not present

## 2015-03-19 DIAGNOSIS — M25561 Pain in right knee: Secondary | ICD-10-CM | POA: Diagnosis not present

## 2015-03-19 DIAGNOSIS — R5383 Other fatigue: Secondary | ICD-10-CM | POA: Diagnosis not present

## 2015-03-19 DIAGNOSIS — I619 Nontraumatic intracerebral hemorrhage, unspecified: Secondary | ICD-10-CM | POA: Diagnosis not present

## 2015-03-19 DIAGNOSIS — I618 Other nontraumatic intracerebral hemorrhage: Secondary | ICD-10-CM | POA: Diagnosis not present

## 2015-03-19 DIAGNOSIS — I4891 Unspecified atrial fibrillation: Secondary | ICD-10-CM | POA: Diagnosis not present

## 2015-03-19 DIAGNOSIS — Z9849 Cataract extraction status, unspecified eye: Secondary | ICD-10-CM | POA: Diagnosis not present

## 2015-03-19 DIAGNOSIS — I615 Nontraumatic intracerebral hemorrhage, intraventricular: Secondary | ICD-10-CM | POA: Diagnosis not present

## 2015-03-19 DIAGNOSIS — N3000 Acute cystitis without hematuria: Secondary | ICD-10-CM | POA: Diagnosis not present

## 2015-03-19 DIAGNOSIS — M8580 Other specified disorders of bone density and structure, unspecified site: Secondary | ICD-10-CM | POA: Diagnosis present

## 2015-03-19 DIAGNOSIS — T82594A Other mechanical complication of infusion catheter, initial encounter: Secondary | ICD-10-CM

## 2015-03-19 DIAGNOSIS — E870 Hyperosmolality and hypernatremia: Secondary | ICD-10-CM | POA: Diagnosis not present

## 2015-03-19 DIAGNOSIS — H9190 Unspecified hearing loss, unspecified ear: Secondary | ICD-10-CM | POA: Diagnosis not present

## 2015-03-19 DIAGNOSIS — R339 Retention of urine, unspecified: Secondary | ICD-10-CM | POA: Diagnosis not present

## 2015-03-19 DIAGNOSIS — I639 Cerebral infarction, unspecified: Secondary | ICD-10-CM

## 2015-03-19 DIAGNOSIS — Z6828 Body mass index (BMI) 28.0-28.9, adult: Secondary | ICD-10-CM | POA: Diagnosis not present

## 2015-03-19 DIAGNOSIS — I680 Cerebral amyloid angiopathy: Secondary | ICD-10-CM | POA: Diagnosis present

## 2015-03-19 DIAGNOSIS — I629 Nontraumatic intracranial hemorrhage, unspecified: Secondary | ICD-10-CM | POA: Diagnosis not present

## 2015-03-19 DIAGNOSIS — I248 Other forms of acute ischemic heart disease: Secondary | ICD-10-CM | POA: Diagnosis not present

## 2015-03-19 DIAGNOSIS — K5900 Constipation, unspecified: Secondary | ICD-10-CM | POA: Diagnosis not present

## 2015-03-19 DIAGNOSIS — J690 Pneumonitis due to inhalation of food and vomit: Secondary | ICD-10-CM

## 2015-03-19 DIAGNOSIS — R4689 Other symptoms and signs involving appearance and behavior: Secondary | ICD-10-CM

## 2015-03-19 DIAGNOSIS — I69154 Hemiplegia and hemiparesis following nontraumatic intracerebral hemorrhage affecting left non-dominant side: Secondary | ICD-10-CM | POA: Diagnosis not present

## 2015-03-19 DIAGNOSIS — S06350S Traumatic hemorrhage of left cerebrum without loss of consciousness, sequela: Secondary | ICD-10-CM | POA: Diagnosis not present

## 2015-03-19 DIAGNOSIS — I6789 Other cerebrovascular disease: Secondary | ICD-10-CM | POA: Diagnosis not present

## 2015-03-19 LAB — CBC WITH DIFFERENTIAL/PLATELET
BASOS ABS: 0 10*3/uL (ref 0.0–0.1)
BASOS PCT: 0 % (ref 0–1)
EOS PCT: 0 % (ref 0–5)
Eosinophils Absolute: 0 10*3/uL (ref 0.0–0.7)
HCT: 38.1 % (ref 36.0–46.0)
Hemoglobin: 13 g/dL (ref 12.0–15.0)
LYMPHS ABS: 0.9 10*3/uL (ref 0.7–4.0)
Lymphocytes Relative: 8 % — ABNORMAL LOW (ref 12–46)
MCH: 31.2 pg (ref 26.0–34.0)
MCHC: 34.1 g/dL (ref 30.0–36.0)
MCV: 91.4 fL (ref 78.0–100.0)
Monocytes Absolute: 1 10*3/uL (ref 0.1–1.0)
Monocytes Relative: 9 % (ref 3–12)
NEUTROS ABS: 9.2 10*3/uL — AB (ref 1.7–7.7)
NEUTROS PCT: 83 % — AB (ref 43–77)
Platelets: 182 10*3/uL (ref 150–400)
RBC: 4.17 MIL/uL (ref 3.87–5.11)
RDW: 12.9 % (ref 11.5–15.5)
WBC: 11.1 10*3/uL — AB (ref 4.0–10.5)

## 2015-03-19 LAB — URINALYSIS, ROUTINE W REFLEX MICROSCOPIC
Bilirubin Urine: NEGATIVE
GLUCOSE, UA: NEGATIVE mg/dL
Hgb urine dipstick: NEGATIVE
Ketones, ur: NEGATIVE mg/dL
Leukocytes, UA: NEGATIVE
NITRITE: NEGATIVE
Protein, ur: NEGATIVE mg/dL
Specific Gravity, Urine: 1.02 (ref 1.005–1.030)
UROBILINOGEN UA: 0.2 mg/dL (ref 0.0–1.0)
pH: 7 (ref 5.0–8.0)

## 2015-03-19 LAB — PROTIME-INR
INR: 1.19 (ref 0.00–1.49)
PROTHROMBIN TIME: 15.2 s (ref 11.6–15.2)

## 2015-03-19 LAB — COMPREHENSIVE METABOLIC PANEL
ALT: 27 U/L (ref 14–54)
ANION GAP: 12 (ref 5–15)
AST: 24 U/L (ref 15–41)
Albumin: 3.7 g/dL (ref 3.5–5.0)
Alkaline Phosphatase: 50 U/L (ref 38–126)
BILIRUBIN TOTAL: 1.4 mg/dL — AB (ref 0.3–1.2)
BUN: 16 mg/dL (ref 6–20)
CALCIUM: 8.9 mg/dL (ref 8.9–10.3)
CO2: 28 mmol/L (ref 22–32)
CREATININE: 0.87 mg/dL (ref 0.44–1.00)
Chloride: 95 mmol/L — ABNORMAL LOW (ref 101–111)
GFR calc Af Amer: >60 mL/min (ref >60)
GFR calc non Af Amer: 55 mL/min — ABNORMAL LOW (ref >60)
Glucose, Bld: 147 mg/dL — ABNORMAL HIGH (ref 65–99)
POTASSIUM: 3.7 mmol/L (ref 3.5–5.1)
Sodium: 135 mmol/L (ref 135–145)
Total Protein: 6.8 g/dL (ref 6.5–8.1)

## 2015-03-19 LAB — MRSA PCR SCREENING: MRSA by PCR: NEGATIVE

## 2015-03-19 LAB — OSMOLALITY: OSMOLALITY: 284 mosm/kg (ref 275–300)

## 2015-03-19 LAB — TROPONIN I: Troponin I: <0.03 ng/mL (ref <0.031)

## 2015-03-19 MED ORDER — ACETAMINOPHEN 650 MG RE SUPP
650.0000 mg | RECTAL | Status: DC | PRN
  Administered 2015-03-20: 650 mg via RECTAL
  Filled 2015-03-19: qty 1

## 2015-03-19 MED ORDER — SODIUM CHLORIDE 0.9 % IV SOLN
INTRAVENOUS | Status: DC
  Administered 2015-03-19: 17:00:00 via INTRAVENOUS

## 2015-03-19 MED ORDER — MANNITOL 20 % IV SOLN
0.2500 g/kg | Freq: Four times a day (QID) | INTRAVENOUS | Status: DC
  Filled 2015-03-19 (×2): qty 500

## 2015-03-19 MED ORDER — PANTOPRAZOLE SODIUM 40 MG IV SOLR
40.0000 mg | Freq: Every day | INTRAVENOUS | Status: DC
  Administered 2015-03-19 – 2015-03-21 (×3): 40 mg via INTRAVENOUS
  Filled 2015-03-19 (×4): qty 40

## 2015-03-19 MED ORDER — MANNITOL 20 % IV SOLN
1.0000 g/kg | Freq: Once | INTRAVENOUS | Status: AC
  Administered 2015-03-19: 55 g via INTRAVENOUS
  Filled 2015-03-19: qty 500

## 2015-03-19 MED ORDER — SENNOSIDES-DOCUSATE SODIUM 8.6-50 MG PO TABS
1.0000 | ORAL_TABLET | Freq: Two times a day (BID) | ORAL | Status: DC
  Administered 2015-03-21 – 2015-03-29 (×11): 1 via ORAL
  Filled 2015-03-19 (×20): qty 1

## 2015-03-19 MED ORDER — SODIUM CHLORIDE 0.9 % IV BOLUS (SEPSIS)
500.0000 mL | Freq: Once | INTRAVENOUS | Status: AC
  Administered 2015-03-19: 500 mL via INTRAVENOUS

## 2015-03-19 MED ORDER — ACETAMINOPHEN 325 MG PO TABS
650.0000 mg | ORAL_TABLET | ORAL | Status: DC | PRN
  Administered 2015-03-19 – 2015-03-28 (×9): 650 mg via ORAL
  Filled 2015-03-19 (×9): qty 2

## 2015-03-19 MED ORDER — LABETALOL HCL 5 MG/ML IV SOLN
10.0000 mg | INTRAVENOUS | Status: DC | PRN
  Administered 2015-03-19: 10 mg via INTRAVENOUS
  Administered 2015-03-19 – 2015-03-20 (×2): 20 mg via INTRAVENOUS
  Filled 2015-03-19 (×3): qty 4

## 2015-03-19 MED ORDER — MANNITOL 25 % IV SOLN
55.0000 g | Freq: Once | INTRAVENOUS | Status: DC
  Filled 2015-03-19: qty 220

## 2015-03-19 MED ORDER — MANNITOL 25 % IV SOLN
13.8000 g | Freq: Four times a day (QID) | INTRAVENOUS | Status: DC
  Administered 2015-03-19 – 2015-03-20 (×2): 13.8 g via INTRAVENOUS
  Filled 2015-03-19: qty 50
  Filled 2015-03-19: qty 55.2
  Filled 2015-03-19 (×2): qty 50
  Filled 2015-03-19 (×3): qty 55.2

## 2015-03-19 MED ORDER — STROKE: EARLY STAGES OF RECOVERY BOOK
Freq: Once | Status: DC
  Filled 2015-03-19: qty 1

## 2015-03-19 NOTE — ED Notes (Signed)
The pt has stopped yawning at preent more alert than a few mihnbutes ago  Squeezes with both hands    She knows her name where she is and the president.

## 2015-03-19 NOTE — Consult Note (Signed)
CC:  Chief Complaint  Patient presents with  . Weakness  . Fatigue    HPI: [name] is a 81 y.o. female brought to the ED by EMS after family found her in the bathroom, very lethargic. Prior to this, she is apparently independent, ambulates normally, able to drive. We are unable to obtain any meaningful history from the patient herself.  PMH: Past Medical History  Diagnosis Date  . Hypertension   . Osteopenia     PSH: Past Surgical History  Procedure Laterality Date  . Cataract extraction      SH: History  Substance Use Topics  . Smoking status: Never Smoker   . Smokeless tobacco: Never Used  . Alcohol Use: 0.6 oz/week    1 Glasses of wine per week     Comment: 1 glass with dinner    MEDS: Prior to Admission medications   Medication Sig Start Date End Date Taking? Authorizing Provider  fish oil-omega-3 fatty acids 1000 MG capsule Take 2 g by mouth daily.      Historical Provider, MD  labetalol (NORMODYNE) 100 MG tablet TAKE 1 TABLET BY MOUTH TWICE A DAY 12/03/14   Marin Olp, MD  losartan (COZAAR) 100 MG tablet Take 1 tablet (100 mg total) by mouth daily. 01/08/15   Marin Olp, MD  Multiple Vitamin (MULTIVITAMIN) tablet Take 1 tablet by mouth daily.      Historical Provider, MD    ALLERGY: Allergies  Allergen Reactions  . Ace Inhibitors     REACTION: angioedema    ROS: ROS  NEUROLOGIC EXAM: Somnolent, but arousable to voice Answers simple questions, follows simple commands Oriented to person, hospital.  Speech is dysarthric Good strength RUE/RLE At lest antigravity LUE, Minimal movement of LLE  IMGAING: CTH reviewed demonstrating a large right temporal intraparenchymal hematoma. There is R->L MLS of 5-44mm. Ambient cistern is patent without identifiable trans-tentorial herniation. No HCP.  IMPRESSION: - 81 y.o. 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 She is not a candidate for surgical  evacuation/decompression.  PLAN: - Would recommend neurology consult, will likely need admission for monitoring, BP control - Will likely need to address code status.

## 2015-03-19 NOTE — ED Provider Notes (Signed)
CSN: [id number]     Arrival date & time 03/19/15  1128 History   First MD Initiated Contact with Patient 03/19/15 1155     Chief Complaint  Patient presents with  . Weakness  . Fatigue     (Consider location/radiation/quality/duration/timing/severity/associated sxs/prior Treatment) HPI Comments: 81 year old female history of high blood pressure, arthritis, very healthy and functional drives and cooks her own meals. Patient lives with family. Family knows that she had mild general weakness confusion and lethargy this morning it gradually worsened. No history of stroke no head injuries no blood thinners. Urinary symptoms no fevers or chills. Nothing is improved her signs. Patient normally talkative and minimal verbal. No smoking history  Patient is a 81 y.o. female presenting with weakness. The history is provided by a relative.  Weakness    Past Medical History  Diagnosis Date  . Hypertension   . Osteopenia    Past Surgical History  Procedure Laterality Date  . Cataract extraction     Family History  Problem Relation Age of Onset  . Cancer Daughter     breast   History  Substance Use Topics  . Smoking status: Never Smoker   . Smokeless tobacco: Never Used  . Alcohol Use: 0.6 oz/week    1 Glasses of wine per week     Comment: 1 glass with dinner   OB History    No data available     Review of Systems  Unable to perform ROS: Mental status change  Neurological: Positive for weakness.      Allergies  Ace inhibitors  Home Medications   Prior to Admission medications   Medication Sig Start Date End Date Taking? Authorizing Provider  fish oil-omega-3 fatty acids 1000 MG capsule Take 2 g by mouth daily.      Historical Provider, MD  labetalol (NORMODYNE) 100 MG tablet TAKE 1 TABLET BY MOUTH TWICE A DAY 12/03/14   Marin Olp, MD  losartan (COZAAR) 100 MG tablet Take 1 tablet (100 mg total) by mouth daily. 01/08/15   Marin Olp, MD  Multiple Vitamin  (MULTIVITAMIN) tablet Take 1 tablet by mouth daily.      Historical Provider, MD   BP 150/60 mmHg  Pulse 77  Temp(Src) 98.8 F (37.1 C) (Oral)  Resp 23  Ht 4\' 11"  (1.499 m)  Wt 121 lb (54.885 kg)  BMI 24.43 kg/m2  SpO2 99% Physical Exam  Constitutional: She appears well-developed and well-nourished. No distress.  HENT:  Head: Normocephalic and atraumatic.  Eyes: Right eye exhibits no discharge. Left eye exhibits no discharge.  Neck: Normal range of motion. Neck supple. No tracheal deviation present.  Cardiovascular: Normal rate and regular rhythm.   Pulmonary/Chest: Effort normal and breath sounds normal.  Abdominal: Soft. She exhibits no distension. There is no tenderness. There is no guarding.  Musculoskeletal: She exhibits no edema.  Neurological: GCS eye subscore is 3. GCS verbal subscore is 4. GCS motor subscore is 5.  Patient lethargic, alert to loud verbal and painful stimuli, worsening weakness in the left versus the right difficult exam.perrl Neck supple  Skin: Skin is warm. No rash noted.  Psychiatric:  confused  Nursing note and vitals reviewed.   ED Course  Procedures (including critical care time) CRITICAL CARE Performed by: Mariea Clonts   Total critical care time: 35 min  Critical care time was exclusive of separately billable procedures and treating other patients.  Critical care was necessary to treat or prevent imminent or life-threatening  deterioration.  Critical care was time spent personally by me on the following activities: development of treatment plan with patient and/or surrogate as well as nursing, discussions with consultants, evaluation of patient's response to treatment, examination of patient, obtaining history from patient or surrogate, ordering and performing treatments and interventions, ordering and review of laboratory studies, ordering and review of radiographic studies, pulse oximetry and re-evaluation of patient's condition.  Labs  Review Labs Reviewed  CBC WITH DIFFERENTIAL/PLATELET - Abnormal; Notable for the following:    WBC 11.1 (*)    Neutrophils Relative % 83 (*)    Neutro Abs 9.2 (*)    Lymphocytes Relative 8 (*)    All other components within normal limits  COMPREHENSIVE METABOLIC PANEL - Abnormal; Notable for the following:    Chloride 95 (*)    Glucose, Bld 147 (*)    Total Bilirubin 1.4 (*)    GFR calc non Af Amer 55 (*)    All other components within normal limits  URINE CULTURE  TROPONIN I  URINALYSIS, ROUTINE W REFLEX MICROSCOPIC (NOT AT [facility])  PROTIME-INR  OSMOLALITY    Imaging Review Ct Head Wo Contrast  03/19/2015   CLINICAL DATA:  Patient is lethargic this morning  EXAM: CT HEAD WITHOUT CONTRAST  TECHNIQUE: Contiguous axial images were obtained from the base of the skull through the vertex without intravenous contrast.  COMPARISON:  November 09, 2014  FINDINGS: There is a 2.8 x 5 cm acute hemorrhage with surrounding edema in the right temporal lobe. There is 6 mm right frontal temporal acute extra-axial hemorrhage. There is mild edema of the right cerebrum with 6 mm right to left midline shift. The bony calvarium is intact. Mild mucoperiosteal thickening of bilateral ethmoid sinuses are noted.  IMPRESSION: Large intraparenchymal hemorrhage of the right temporal lobe with a 6 mm right frontal temporal acute extra-axial hemorrhage. There is right to left midline shift.  Critical Value/emergent results were called by telephone at the time of interpretation on 03/19/2015 at 1:57 pm to Dr. Elnora Morrison , who verbally acknowledged these results.   Electronically Signed   By: Abelardo Diesel M.D.   On: 03/19/2015 13:57     EKG Interpretation None      MDM   Final diagnoses:  Brain bleed  Altered mental status, unspecified altered mental status type   Patient presents with altered mental status worsening weakness on the left. Concern for stroke versus metabolic versus urine infection. CT scan results  reviewed by myself and confirmed by radiology, discussed with radiologist with intraparenchymal hemorrhage temple region. Will discuss details with family especially given age, neurosurgery paged for consult. No blood thinners.  Spoke with nsgy and neuro for icu admit.  No change on recheck, somnolent. DNR.  The patients results and plan were reviewed and discussed.   Any x-rays performed were independently reviewed by myself.   Differential diagnosis were considered with the presenting HPI.  Medications   stroke: mapping our early stages of recovery book (not administered)  acetaminophen (TYLENOL) tablet 650 mg (not administered)    Or  acetaminophen (TYLENOL) suppository 650 mg (not administered)  senna-docusate (Senokot-S) tablet 1 tablet (not administered)  labetalol (NORMODYNE,TRANDATE) injection 10-40 mg (not administered)  pantoprazole (PROTONIX) injection 40 mg (not administered)  0.9 %  sodium chloride infusion (not administered)  mannitol 20 % infusion 55 g (not administered)  mannitol 20 % infusion 13.8 g (not administered)  sodium chloride 0.9 % bolus 500 mL (0 mLs Intravenous Stopped 03/19/15  1517)    Filed Vitals:   03/19/15 1403 03/19/15 1500 03/19/15 1515 03/19/15 1619  BP: 148/56 153/60 140/63 150/60  Pulse: 70 72 64 77  Temp: 98.8 F (37.1 C)     TempSrc: Oral     Resp: 21 22 19 23   Height:      Weight:      SpO2: 98% 99% 96% 99%    Final diagnoses:  Brain bleed  Altered mental status, unspecified altered mental status type    Admission/ observation were discussed with the admitting physician, patient and/or family and they are comfortable with the plan.     Elnora Morrison, MD 03/19/15 [phone]

## 2015-03-19 NOTE — ED Notes (Signed)
The pt passed the swallow screen 

## 2015-03-19 NOTE — ED Notes (Signed)
Family at the bedside.

## 2015-03-19 NOTE — H&P (Signed)
Admission H&P Referring physician: ED Chief Complaint: altered mental status, left hemiparesis, dysarthria, ICH HPI: [name] is an 81 y.o. female with a past medical history significant for HTN, osteopenia, brought to [facility]ED via EMS for evaluation of the above stated symptoms/signs. Daughter and son in law are at the bedside and stated that they last saw her normal last nigh and this morning they found her sitting in the bathroom acting sleepy, poorly responsive and not speaking". They said that at baseline she is very active and functional and this was a dramatic change for her and thus EMS was summoned. When EMS arrived they report that patient was having slurred speech and not moving the left side. BP was apparently not significantly elevated initially and as a matter of fact BP 140/80 in the ED. No recent head trauma. Patient is not taking anticoagulants but has been on baby aspirin for the last couple of months. CT brain performed in the ED was personally reviewed and showed a large intraparenchymal hemorrhage of the right temporal lobe with a 6 mm right frontal temporal acute extra-axial hemorrhage with associated right to left midline shift.  Neurosurgery consulted by ED attending and I was informed that patient was not considered a surgical candidate at this time. Serologies: platelet count 182,000. INR 1.19. [name] is drowsy but open her eyes and follows commands. Said that she had a HA the whole day yesterday but denies nausea, vomiting, vertigo, double vision, or language difficulty.  LSN: 03/18/15 around 9 pm tPA Given: no, ICH   Past Medical History  Diagnosis Date  . Hypertension   . Osteopenia     Past Surgical History  Procedure Laterality Date  . Cataract extraction      Family History  Problem Relation Age of Onset  . Cancer Daughter     breast   Social History:  reports that she has never smoked. She has never used smokeless tobacco. She reports that she  drinks about 0.6 oz of alcohol per week. She reports that she does not use illicit drugs. Family history: unable to obtain due to mental status Allergies:  Allergies  Allergen Reactions  . Ace Inhibitors     REACTION: angioedema     (Not in a hospital admission)  ROS: unable to obtain due to mental status.   Physical Examination: Blood pressure 140/63, pulse 64, temperature 98.8 F (37.1 C), temperature source Oral, resp. rate 19, height '4\' 11"'  (1.499 m), weight 54.885 kg (121 lb), SpO2 96 %. HEENT-  Normocephalic, no lesions, without obvious abnormality.  Normal external eye and conjunctiva.  Normal TM's bilaterally.  Normal auditory canals and external ears. Normal external nose, mucus membranes and septum.  Normal pharynx. Neck supple with no masses, nodes, nodules or enlargement. Cardiovascular - regular rate and rhythm, S1, S2 normal, no murmur, click, rub or gallop Lungs - chest clear, no wheezing, rales, normal symmetric air entry, Heart exam - S1, S2 normal, no murmur, no gallop, rate regular Abdomen - soft, non-tender; bowel sounds normal; no masses,  no organomegaly Extremities - no joint deformities, effusion, or inflammation, no edema, no skin discoloration, no clubbing and no cyanosis  Neurologic Examination: General: Mental Status: Drowsy, oriented to month.  Speech fluent without evidence of aphasia.  Able to follow simple step commands. Cranial Nerves: II: Discs flat bilaterally; Visual fields grossly normal, pupils equal, round, reactive to light and accommodation III,IV, VI: ptosis not present, extra-ocular motions intact bilaterally V,VII: smile symmetric, facial light  touch sensation normal bilaterally VIII: hearing normal bilaterally IX,X: uvula rises symmetrically XI: bilateral shoulder shrug XII: midline tongue extension without atrophy or fasciculations Motor: Significant for mild left hemiparesis Tone and bulk:normal tone throughout; no atrophy  noted Sensory: Pinprick and light touch intact throughout, bilaterally Deep Tendon Reflexes:  1+ all over Plantars: Right: downgoing   Left: upgoing Cerebellar: Unable to test due to mental status Gait:  Unable to test due to mental status    Results for orders placed or performed during the hospital encounter of 03/19/15 (from the past 48 hour(s))  CBC with Differential/Platelet     Status: Abnormal   Collection Time: 03/19/15 12:21 PM  Result Value Ref Range   WBC 11.1 (H) 4.0 - 10.5 K/uL   RBC 4.17 3.87 - 5.11 MIL/uL   Hemoglobin 13.0 12.0 - 15.0 g/dL   HCT 38.1 36.0 - 46.0 %   MCV 91.4 78.0 - 100.0 fL   MCH 31.2 26.0 - 34.0 pg   MCHC 34.1 30.0 - 36.0 g/dL   RDW 12.9 11.5 - 15.5 %   Platelets 182 150 - 400 K/uL   Neutrophils Relative % 83 (H) 43 - 77 %   Neutro Abs 9.2 (H) 1.7 - 7.7 K/uL   Lymphocytes Relative 8 (L) 12 - 46 %   Lymphs Abs 0.9 0.7 - 4.0 K/uL   Monocytes Relative 9 3 - 12 %   Monocytes Absolute 1.0 0.1 - 1.0 K/uL   Eosinophils Relative 0 0 - 5 %   Eosinophils Absolute 0.0 0.0 - 0.7 K/uL   Basophils Relative 0 0 - 1 %   Basophils Absolute 0.0 0.0 - 0.1 K/uL  Comprehensive metabolic panel     Status: Abnormal   Collection Time: 03/19/15 12:21 PM  Result Value Ref Range   Sodium 135 135 - 145 mmol/L   Potassium 3.7 3.5 - 5.1 mmol/L   Chloride 95 (L) 101 - 111 mmol/L   CO2 28 22 - 32 mmol/L   Glucose, Bld 147 (H) 65 - 99 mg/dL   BUN 16 6 - 20 mg/dL   Creatinine, Ser 0.87 0.44 - 1.00 mg/dL   Calcium 8.9 8.9 - 10.3 mg/dL   Total Protein 6.8 6.5 - 8.1 g/dL   Albumin 3.7 3.5 - 5.0 g/dL   AST 24 15 - 41 U/L   ALT 27 14 - 54 U/L   Alkaline Phosphatase 50 38 - 126 U/L   Total Bilirubin 1.4 (H) 0.3 - 1.2 mg/dL   GFR calc non Af Amer 55 (L) >60 mL/min   GFR calc Af Amer >60 >60 mL/min    Comment: (NOTE) The eGFR has been calculated using the CKD EPI equation. This calculation has not been validated in all clinical situations. eGFR's persistently <60  mL/min signify possible Chronic Kidney Disease.    Anion gap 12 5 - 15  Troponin I     Status: None   Collection Time: 03/19/15 12:21 PM  Result Value Ref Range   Troponin I <0.03 <0.031 ng/mL    Comment:        NO INDICATION OF MYOCARDIAL INJURY.   Protime-INR     Status: None   Collection Time: 03/19/15 12:21 PM  Result Value Ref Range   Prothrombin Time 15.2 11.6 - 15.2 seconds   INR 1.19 0.00 - 1.49  Urinalysis, Routine w reflex microscopic (not at [facility])     Status: None   Collection Time: 03/19/15  1:24 PM  Result Value  Ref Range   Color, Urine YELLOW YELLOW   APPearance CLEAR CLEAR    Comment: LESS THAN 10 mL OF URINE SUBMITTED   Specific Gravity, Urine 1.020 1.005 - 1.030   pH 7.0 5.0 - 8.0   Glucose, UA NEGATIVE NEGATIVE mg/dL   Hgb urine dipstick NEGATIVE NEGATIVE   Bilirubin Urine NEGATIVE NEGATIVE   Ketones, ur NEGATIVE NEGATIVE mg/dL   Protein, ur NEGATIVE NEGATIVE mg/dL   Urobilinogen, UA 0.2 0.0 - 1.0 mg/dL   Nitrite NEGATIVE NEGATIVE   Leukocytes, UA NEGATIVE NEGATIVE    Comment: MICROSCOPIC NOT DONE ON URINES WITH NEGATIVE PROTEIN, BLOOD, LEUKOCYTES, NITRITE, OR GLUCOSE <1000 mg/dL.   Ct Head Wo Contrast  03/19/2015   CLINICAL DATA:  Patient is lethargic this morning  EXAM: CT HEAD WITHOUT CONTRAST  TECHNIQUE: Contiguous axial images were obtained from the base of the skull through the vertex without intravenous contrast.  COMPARISON:  November 09, 2014  FINDINGS: There is a 2.8 x 5 cm acute hemorrhage with surrounding edema in the right temporal lobe. There is 6 mm right frontal temporal acute extra-axial hemorrhage. There is mild edema of the right cerebrum with 6 mm right to left midline shift. The bony calvarium is intact. Mild mucoperiosteal thickening of bilateral ethmoid sinuses are noted.  IMPRESSION: Large intraparenchymal hemorrhage of the right temporal lobe with a 6 mm right frontal temporal acute extra-axial hemorrhage. There is right to left midline  shift.  Critical Value/emergent results were called by telephone at the time of interpretation on 03/19/2015 at 1:57 pm to Dr. Elnora Morrison , who verbally acknowledged these results.   Electronically Signed   By: Abelardo Diesel M.D.   On: 03/19/2015 13:57    Assessment: 81 y.o. female with HTN, brought in due to altered mental status, left hemiparesis, dysarthria. CT brain demonstrated a large intraparenchymal hemorrhage of the right temporal lobe with a 6 mm right frontal temporal acute extra-axial hemorrhage with associated right to left midline shift.  Patient was not particularly hypertensive and location may favor probable lobar hemorrhage in the context of  CAA.  She is drowsy but able to answer questions and follow simple commands. Family was updated regarding patient's critical condition. Although her mental status is not significantly impaired, I expressed to the family my concern that she may experienced decline in her mental status tonight and require intubation which they accepted if necessary. Neurosurgery was consulted by ED attending and no surgical intervention at this moment was recommended. Further, I discussed treatment options for cerebral edema and they elected not placing a central line but instead use of mannitol. Admit to NICU. No anticoagulants or antiplatelets. Maintain SBP<160. Follow up CT head tomorrow. Stroke team will resume care tomorrow,   Stroke Risk Factors - age, HTN   Dorian Pod, MD [facility] [phone]  03/19/2015, 3:58 PM

## 2015-03-19 NOTE — Progress Notes (Signed)
eLink Physician-Brief Progress Note Patient Name: [name] DOB: [birth year] MRN: [id number]   Date of Service  03/19/2015  HPI/Events of Note  81 yo female with PMH of HTN. Admitted to ICU d/t L hemipariesis and dysarthria. Found to have large R temporal lobe ICH with 6 mm R frontal temporal acute extra-axial hemorrhage with R --> L shift. Not felt to be an operative candidate by Neurosurgery. Medical regimen includes Labetolol IV PRN and Mannitol. Management per Neurology.   eICU Interventions  Would address code status. Otherwise continue present management.      Intervention Category Evaluation Type: New Patient Evaluation  Lysle Dingwall 03/19/2015, 7:08 PM

## 2015-03-19 NOTE — ED Notes (Signed)
EMS - Patient coming from home.  Patients family stated "patient was sitting on a chest in the bathroom acting lethargic" around 9:30 this am.  LKN 20:00 last night.  Family noted with EMS patient was having left sided weakness, slurred speech and unsteady gait.  History of Hypertension.  Removed from taking Tramadol due to making patient lethargic, patient does have access to medication.  Received Cortisol shot in the right knee yesterday.  140/80, 50-60 HR, CBG 171 and 95-98% room air.  ED - Patient is lethargic in the ED and requires verbal stimulation.

## 2015-03-20 ENCOUNTER — Inpatient Hospital Stay (HOSPITAL_COMMUNITY): Payer: Medicare Other

## 2015-03-20 LAB — BASIC METABOLIC PANEL
ANION GAP: 12 (ref 5–15)
BUN: 12 mg/dL (ref 6–20)
CALCIUM: 8.5 mg/dL — AB (ref 8.9–10.3)
CO2: 25 mmol/L (ref 22–32)
Chloride: 96 mmol/L — ABNORMAL LOW (ref 101–111)
Creatinine, Ser: 0.8 mg/dL (ref 0.44–1.00)
GFR calc Af Amer: >60 mL/min (ref >60)
GFR calc non Af Amer: >60 mL/min (ref >60)
Glucose, Bld: 171 mg/dL — ABNORMAL HIGH (ref 65–99)
Potassium: 3.3 mmol/L — ABNORMAL LOW (ref 3.5–5.1)
Sodium: 133 mmol/L — ABNORMAL LOW (ref 135–145)

## 2015-03-20 LAB — TROPONIN I
TROPONIN I: 0.04 ng/mL — AB (ref <0.031)
TROPONIN I: 0.04 ng/mL — AB (ref <0.031)
TROPONIN I: 0.06 ng/mL — AB (ref <0.031)
Troponin I: 0.1 ng/mL — ABNORMAL HIGH (ref <0.031)

## 2015-03-20 LAB — OSMOLALITY
OSMOLALITY: 283 mosm/kg (ref 275–300)
Osmolality: 287 mOsm/kg (ref 275–300)
Osmolality: 287 mOsm/kg (ref 275–300)

## 2015-03-20 LAB — CK TOTAL AND CKMB (NOT AT ARMC)
CK TOTAL: 88 U/L (ref 38–234)
CK, MB: 1 ng/mL (ref 0.5–5.0)
CK, MB: 1.8 ng/mL (ref 0.5–5.0)
CK, MB: 6 ng/mL — ABNORMAL HIGH (ref 0.5–5.0)
RELATIVE INDEX: INVALID (ref 0.0–2.5)
RELATIVE INDEX: INVALID (ref 0.0–2.5)
RELATIVE INDEX: INVALID (ref 0.0–2.5)
Total CK: 66 U/L (ref 38–234)
Total CK: 67 U/L (ref 38–234)

## 2015-03-20 LAB — SODIUM
Sodium: 137 mmol/L (ref 135–145)
Sodium: 140 mmol/L (ref 135–145)

## 2015-03-20 LAB — URINE CULTURE: Culture: NO GROWTH

## 2015-03-20 MED ORDER — NICARDIPINE HCL IN NACL 20-0.86 MG/200ML-% IV SOLN
3.0000 mg/h | INTRAVENOUS | Status: DC
  Administered 2015-03-20: 6 mg/h via INTRAVENOUS
  Administered 2015-03-20 – 2015-03-21 (×2): 3 mg/h via INTRAVENOUS
  Filled 2015-03-20 (×2): qty 200

## 2015-03-20 MED ORDER — HYDRALAZINE HCL 20 MG/ML IJ SOLN
10.0000 mg | Freq: Once | INTRAMUSCULAR | Status: AC
  Administered 2015-03-20: 10 mg via INTRAVENOUS
  Filled 2015-03-20: qty 1

## 2015-03-20 MED ORDER — NICARDIPINE HCL IN NACL 20-0.86 MG/200ML-% IV SOLN
INTRAVENOUS | Status: AC
  Filled 2015-03-20: qty 200

## 2015-03-20 MED ORDER — SODIUM CHLORIDE 3 % IV SOLN
INTRAVENOUS | Status: DC
  Administered 2015-03-20: 50 mL/h via INTRAVENOUS
  Administered 2015-03-22: 25 mL/h via INTRAVENOUS
  Administered 2015-03-22: 50 mL/h via INTRAVENOUS
  Filled 2015-03-20 (×18): qty 500

## 2015-03-20 MED ORDER — CHLORHEXIDINE GLUCONATE 0.12 % MT SOLN
15.0000 mL | Freq: Two times a day (BID) | OROMUCOSAL | Status: DC
  Administered 2015-03-20 – 2015-03-29 (×17): 15 mL via OROMUCOSAL
  Filled 2015-03-20 (×14): qty 15

## 2015-03-20 MED ORDER — CETYLPYRIDINIUM CHLORIDE 0.05 % MT LIQD
7.0000 mL | Freq: Two times a day (BID) | OROMUCOSAL | Status: DC
  Administered 2015-03-20 – 2015-03-29 (×17): 7 mL via OROMUCOSAL

## 2015-03-20 NOTE — Progress Notes (Signed)
SLP Cancellation Note  Patient Details Name: [name] MRN: [id number] DOB: Jan 09, 1918   Cancelled treatment:       Reason Eval/Treat Not Completed: Patient at procedure or test/unavailable.  SLP will follow up as able.   [name], M.A., CCC-SLP [phone]  [place] 03/20/2015, 2:23 PM

## 2015-03-20 NOTE — Progress Notes (Signed)
PT Cancellation Note  Patient Details Name: [name] MRN: [id number] DOB: Nov 15, 1917   Cancelled Treatment:    Reason Eval/Treat Not Completed: Patient not medically ready (active bedrest at this time, will follow)   Duncan Dull 03/20/2015, 7:14 AM Alben Deeds, [place] DPT  [phone]

## 2015-03-20 NOTE — Progress Notes (Signed)
After vagal event

## 2015-03-20 NOTE — Progress Notes (Signed)
Utilization Review Completed.Bless [place]isenby T7/27/2016  

## 2015-03-20 NOTE — Progress Notes (Signed)
STROKE TEAM PROGRESS NOTE   HISTORY [name] is an 81 y.o. female with a past medical history significant for HTN, osteopenia, brought to [facility] via EMS for evaluation of altered mental status, left hemiparesis, dysarthria. Daughter and son in law are at the bedside and stated that they last saw her normal last night (LKW 03/18/2015 at 2100) and this morning 03/19/2015 they found her sitting in the bathroom acting sleepy, poorly responsive and not speaking". They said that at baseline she is very active and functional and this was a dramatic change for her and thus EMS was summoned. When EMS arrived they report that patient was having slurred speech and not moving the left side. BP was apparently not significantly elevated initially and as a matter of fact BP 140/80 in the ED. No recent head trauma. Patient is not taking anticoagulants but has been on baby aspirin for the last couple of months. CT brain performed in the ED was personally reviewed and showed a large intraparenchymal hemorrhage of the right temporal lobe with a 6 mm right frontal temporal acute extra-axial hemorrhage with associated right to left midline shift. Neurosurgery consulted by ED attending and I was informed that patient was not considered a surgical candidate. Serologies: platelet count 182,000. INR 1.19. In the ED, [name] is drowsy but open her eyes and follows commands. Said that she had a HA the whole day yesterday but denies nausea, vomiting, vertigo, double vision, or language difficulty. She was admitted to the neuro ICU for further evaluation and treatment.   SUBJECTIVE (INTERVAL HISTORY) Her family is not at the bedside. Dr. Leonie Man spoke to her son-in-law via telephone as the daughter was not available.  Overall she feels her condition is unchanged. Blood pressure has been slightly elevated above goal hence we'll start Cardene drip   OBJECTIVE Temp:  [98.6 F (37 C)-101.2 F (38.4 C)] 99.8 F (37.7 C)  (07/27 0800) Pulse Rate:  [52-105] 87 (07/27 0800) Cardiac Rhythm:  [-] Normal sinus rhythm;Heart block (07/27 0800) Resp:  [12-26] 25 (07/27 0800) BP: (130-216)/(43-115) 153/62 mmHg (07/27 0800) SpO2:  [92 %-99 %] 95 % (07/27 0800) Weight:  [54.885 kg (121 lb)] 54.885 kg (121 lb) (07/26 1230)  No results for input(s): GLUCAP in the last 168 hours.  Recent Labs Lab 03/19/15 1221 03/20/15 0418  NA 135 133*  K 3.7 3.3*  CL 95* 96*  CO2 28 25  GLUCOSE 147* 171*  BUN 16 12  CREATININE 0.87 0.80  CALCIUM 8.9 8.5*    Recent Labs Lab 03/19/15 1221  AST 24  ALT 27  ALKPHOS 50  BILITOT 1.4*  PROT 6.8  ALBUMIN 3.7    Recent Labs Lab 03/19/15 1221  WBC 11.1*  NEUTROABS 9.2*  HGB 13.0  HCT 38.1  MCV 91.4  PLT 182    Recent Labs Lab 03/19/15 1221 03/20/15 0418 03/20/15 0716  CKTOTAL  --  66  --   CKMB  --  1.0  --   TROPONINI <0.03  --  0.04*    Recent Labs  03/19/15 1221  LABPROT 15.2  INR 1.19    Recent Labs  03/19/15 1324  COLORURINE YELLOW  LABSPEC 1.020  PHURINE 7.0  GLUCOSEU NEGATIVE  HGBUR NEGATIVE  BILIRUBINUR NEGATIVE  KETONESUR NEGATIVE  PROTEINUR NEGATIVE  UROBILINOGEN 0.2  NITRITE NEGATIVE  LEUKOCYTESUR NEGATIVE       Component Value Date/Time   CHOL 203* 04/24/2009 1113   TRIG 133.0 04/24/2009 1113   HDL  55.40 04/24/2009 1113   CHOLHDL 4 04/24/2009 1113   VLDL 26.6 04/24/2009 1113   No results found for: HGBA1C No results found for: LABOPIA, COCAINSCRNUR, LABBENZ, AMPHETMU, THCU, LABBARB  No results for input(s): ETH in the last 168 hours.  Ct Head Wo Contrast  03/20/2015   CLINICAL DATA:  Follow-up examination for intraparenchymal hemorrhage.  EXAM: CT HEAD WITHOUT CONTRAST  TECHNIQUE: Contiguous axial images were obtained from the base of the skull through the vertex without intravenous contrast.  COMPARISON:  Prior study from 03/19/2015  FINDINGS: Previous identified intraparenchymal hemorrhage centered in the right  temporal lobe measures approximately 5.9 x 2.2 cm on today's study, felt to be overall little interval changed from previous. Surrounding vasogenic edema is slightly increased. Diffuse edema throughout the right cerebral hemisphere with sulcal effacement is similar.  Subdural hemorrhage overlying the U right frontal temporal convexity measures up to 5 mm in maximal thickness, not significantly changed. Small amount of hemorrhage along the anterior falx. Blood along the right tentorium as well. Trace extra-axial hemorrhage overlying the left frontal lobe. There is persistent 6 mm of right-to-left shift with partial effacement of the right lateral ventricle. Asymmetric dilatation of the left lateral ventricle is not significantly changed. Layering intraventricular hemorrhage within the occipital horns bilaterally, slightly increased.  No new intracranial hemorrhage. No acute large vessel territory infarct.  Both scalp soft tissues within normal limits. No acute abnormality about the orbits.  Scattered mucosal thickening within the ethmoidal air cells. Paranasal sinuses are otherwise clear. No mastoid effusion.  Calvarium intact.  IMPRESSION: 1. No significant interval change in size of right temporal lobe intraparenchymal hemorrhage with slightly increased localized vasogenic edema. 2. No significant interval change in acute subdural hematoma overlying the right cerebral convexity measuring up to 5 mm in maximal thickness. Associated 6 mm of right-to-left shift is not significantly changed. 3. Small volume intraventricular hemorrhage, slightly increased from prior. Asymmetric dilatation of the left lateral ventricle is stable. No hydrocephalus. 4. No new intracranial process.   Electronically Signed   By: Jeannine Boga M.D.   On: 03/20/2015 05:40   Ct Head Wo Contrast  03/19/2015   CLINICAL DATA:  Patient is lethargic this morning  EXAM: CT HEAD WITHOUT CONTRAST  TECHNIQUE: Contiguous axial images were  obtained from the base of the skull through the vertex without intravenous contrast.  COMPARISON:  November 09, 2014  FINDINGS: There is a 2.8 x 5 cm acute hemorrhage with surrounding edema in the right temporal lobe. There is 6 mm right frontal temporal acute extra-axial hemorrhage. There is mild edema of the right cerebrum with 6 mm right to left midline shift. The bony calvarium is intact. Mild mucoperiosteal thickening of bilateral ethmoid sinuses are noted.  IMPRESSION: Large intraparenchymal hemorrhage of the right temporal lobe with a 6 mm right frontal temporal acute extra-axial hemorrhage. There is right to left midline shift.  Critical Value/emergent results were called by telephone at the time of interpretation on 03/19/2015 at 1:57 pm to Dr. Elnora Morrison , who verbally acknowledged these results.   Electronically Signed   By: Abelardo Diesel M.D.   On: 03/19/2015 13:57     PHYSICAL EXAM Frail elderly Caucasian lady not in distress. . Afebrile. Head is nontraumatic. Neck is supple without bruit.    Cardiac exam no murmur or gallop. Lungs are clear to auscultation. Distal pulses are well felt. Neurological Exam :  Stuporous and unresponsive. Eyes are closed. Pupils 3 mm sluggishly reactive. Eyes are  in primary position. Dolls eye movement is sluggish. Does not blink to threat on either side. Fundi were not visualized. Mild left lower facial asymmetry. Tongue midline. She will moan and groan but will not speak or follow commands. She has spontaneous antigravity movements on the right side. She will withdraw minimally in the left upper extremity and modestly in the left lower extremity to painful stimuli. Tone is diminished on the left side and normal on the right. Left plantar is upgoing, right is downgoing. Gait cannot be tested ASSESSMENT/PLAN [name] is a 81 y.o. female with history of hypertension presenting with altered mental status, left hemiparesis, dysarthria.   Stroke:   Non-dominant large right temporal intraparenchymal hemorrhage secondary to likely cerebral amyloid angiopathy source given lobar location and relatively normotensive state on arrival  Neurosurgery consulted, not a surgical evacuation/decompression candidate ([place]und[place]umar)  Resultant  Lethargic, left hemiparesis  Was started on mannitol in the ED with bolus followed by q6h dosing. No documented benefit to giving, will d/c. consider 3% saline based on family desires. Currently they want aggressive care. Daughter on the way. Dr. Leonie Man will discuss with her.  MRI  ordered   MRA  ordered   SCDs for VTE prophylaxis Diet NPO time specified. Per record, pt passed RN swallow screen. Given lethargic state, will keep NPO and have ST assess.  aspirin 81 mg orally every day prior to admission  Ongoing aggressive stroke risk factor management. Patient at risk for neurologic worsening, increased hemorrhage.  Therapy recommendations:  pending   Disposition:  pending   Bradycardia w/ asystole  Transient HR 20s w/ asystole x 2 last night  Malignant Hypertension  Home meds:   Cozaar, labetalol  SBP > 160, treated with 1 dose labetalol around midnight and then 1 dose hydralazine around 4a. BP has been as high as 217 with SBPs in the 170s most of the night  Goal:  SBP < 160  Will start Cardene drip given bradycardia. D/c labetalol.   Other Stroke Risk Factors  Advanced age  ETOH use  Hospital day # [place] for Pager information 03/20/2015 9:42 AM  I have personally examined this patient, reviewed notes, independently viewed imaging studies, participated in medical decision making and plan of care. I have made any additions or clarifications directly to the above note. Agree with note above.  She presented with altered mental status, slurred speech and left-sided weakness secondary to a large right temporal lobar hematoma etiology uncertain possibly  hypertensive versus amyloid angiopathy. She is at significant risk for neurological worsening, cytotoxic cerebral edema and respiratory failure and needs close neurological monitoring and aggressive blood pressure control. I had a long discussion with the patient's son-in-law over the phone and with the daughter at the bedside regarding her prognosis, plan for evaluation, treatment and answered questions. The daughter clearly states that since the patient was quite independent and doing well at baseline she would like to pursue aggressive medical care including possible intubation as well as hypertonic saline to control cerebral edema and even PEG tube if necessary. Neurosurgery has already been consulted and feels she is not a candidate for hematoma evacuation. Hence we will consult pulmonary critical care to place a central line to start hypertonic saline. Check MRI. May consider intubation if respiratory status deteriorates. Discussed with Dr. Nelda Marseille termini critical care medicine This patient is critically ill and at significant risk of neurological worsening, death and care requires constant  monitoring of vital signs, hemodynamics,respiratory and cardiac monitoring, extensive review of multiple databases, frequent neurological assessment, discussion with family, other specialists and medical decision making of high complexity.I have made any additions or clarifications directly to the above note.This critical care time does not reflect procedure time, or teaching time or supervisory time of PA/NP/Med Resident etc but could involve care discussion time.  I spent 50 minutes of neurocritical care time  in the care of  this patient.    Antony Contras, MD Medical Director [facility] Pager: [phone] 03/20/2015 12:30 PM    To contact Stroke Continuity provider, please refer to [email or web address] After hours, contact General Neurology

## 2015-03-20 NOTE — Evaluation (Signed)
Clinical/Bedside Swallow Evaluation Patient Details  Name: [name] MRN: [id number] Date of Birth: May 22, 1918  Today's Date: 03/20/2015 Time: SLP Start Time (ACUTE ONLY): 6440 SLP Stop Time (ACUTE ONLY): 1530 SLP Time Calculation (min) (ACUTE ONLY): 16 min  Past Medical History:  Past Medical History  Diagnosis Date  . Hypertension   . Osteopenia    Past Surgical History:  Past Surgical History  Procedure Laterality Date  . Cataract extraction     HPI:  [name] is a 81 year old female with a past medical history significant for HTN, osteopenia, who was brought to [facility] ED 03/18/15 via EMS for evaluation due to patient being found this AM in a sleepy state, poorly responsive and not speaking. Imaging revealed large right temporal lobe parenchymal hemorrhage, 8 mm midline shift and diffuse small vessel disease.  Patient initially passed RN stroke swallow screen; however, due to [name] orders received for bedside swallow and speech-language evaluation.     Assessment / Plan / Recommendation Clinical Impression  Bedside swallow evaluation completed.  Patient required verbal and tactile cues for arousal.  SLP provided oral care which resulted in patient biting suction swab; however, with Max multimodal cues SLP was able to complete oral care.  Administration of ice chips resulted in oral acceptance and timely mastication of 1 ice chip.  Patient demonstrated no overt s/s of aspiration; however, presentation of second ice chip resulted in no patient initiation/acceptance of bolus.  Fleeting arousal is patient's greatest aspiration risk factor and it is recommended that patient remain NPO at this time.  SLP to follow for PO readiness.          Aspiration Risk  Severe    Diet Recommendation NPO   Medication Administration: Via alternative means    Other  Recommendations Oral Care Recommendations: Oral care QID   Follow Up Recommendations   TBD   Frequency and Duration  min 2x/week  2 weeks   Pertinent Vitals/Pain None    SLP Swallow Goals  See care plan for details   Swallow Study Prior Functional Status   Unknown     General Date of Onset: 03/19/15 Other Pertinent Information: [name] is a 81 year old female with a past medical history significant for HTN, osteopenia, who was brought to [facility] ED 03/18/15 via EMS for evaluation due to patient being found this AM in a sleepy state, poorly responsive and not speaking. Imaging revealed large right temporal lobe parenchymal hemorrhage, 8 mm midline shift and diffuse small vessel disease.  Patient initially passed RN stroke swallow screen; however, due to [place] orders received for bedside swallow and speech-language evaluation.   Type of Study: Bedside swallow evaluation Previous Swallow Assessment: none on record Diet Prior to this Study: NPO Temperature Spikes Noted: No Respiratory Status: Room air History of Recent Intubation: No Behavior/Cognition: Lethargic/Drowsy;Requires cueing Oral Cavity - Dentition: Adequate natural dentition/normal for age Self-Feeding Abilities: Total assist Patient Positioning: Upright in bed Baseline Vocal Quality: Low vocal intensity Volitional Cough: Cognitively unable to elicit Volitional Swallow: Unable to elicit    Oral/Motor/Sensory Function Overall Oral Motor/Sensory Function:  (limited assessment due to [place] but appears generally intact )   Ice Chips Ice chips: Impaired Presentation: Spoon Other Comments: patient was only able to consume 1 ice chip with no overt s/s of aspiration due to brief periods of arousal unable to consume a second   Thin Liquid Thin Liquid: Not tested    Nectar Thick Nectar  Thick Liquid: Not tested   Honey Thick Honey Thick Liquid: Not tested   Puree Puree: Not tested   Solid   GO    Solid: Not tested      [name], M.A., CCC-SLP [phone]  [name] 03/20/2015,3:51 PM

## 2015-03-20 NOTE — Progress Notes (Signed)
Pt difficulty urinating in bedpan. Pt to bedside commode. Noted Pt HR to trend down 40,30, 20bpm. Pt began to have seizure like activity, monitor read asystole. Pt put back in bed, slowly arousing, SR with 1st on monitor. Answers all orientation questions appropriately. BP 216/90. Dr. Nicole Kindred called, notified of events and pt's current condition. Orders for cardiac enzymes, BMP, EKG,  10mg  IV hydralazine x1. Informed MD of CT head f/u at 0500. MD acknowledged. Will continue to monitor.

## 2015-03-20 NOTE — Progress Notes (Signed)
OT Cancellation Note  Patient Details Name: [name] MRN: [id number] DOB: 10-21-1917   Cancelled Treatment:    Reason Eval/Treat Not Completed: Other (comment);Patient not medically ready Pt with active bedrest orders. Please update activity orders when appropriate to begin therapy. thanks [place], OTR/L  [phone] 03/20/2015 03/20/2015, 7:08 AM

## 2015-03-20 NOTE — Procedures (Signed)
Central Venous Catheter Insertion Procedure Note BRITYN MASTROGIOVANNI [id number] 15-Dec-1917  Procedure: Insertion of Central Venous Catheter Indications: Assessment of intravascular volume, Drug and/or fluid administration and Frequent blood sampling  Procedure Details Consent: Risks of procedure as well as the alternatives and risks of each were explained to the (patient/caregiver).  Consent for procedure obtained. Time Out: Verified patient identification, verified procedure, site/side was marked, verified correct patient position, special equipment/implants available, medications/allergies/relevent history reviewed, required imaging and test results available.  Performed  Maximum sterile technique was used including antiseptics, cap, gloves, gown, hand hygiene, mask and sheet. Skin prep: Chlorhexidine; local anesthetic administered A antimicrobial bonded/coated triple lumen catheter was placed in the left internal jugular vein using the Seldinger technique.  Evaluation Blood flow good Complications: No apparent complications Patient did tolerate procedure well. Chest X-ray ordered to verify placement.  CXR: pending.  U/S used in placement.  YACOUB,WESAM 03/20/2015, 2:27 PM

## 2015-03-21 LAB — OSMOLALITY
OSMOLALITY: 284 mosm/kg (ref 275–300)
Osmolality: 293 mOsm/kg (ref 275–300)

## 2015-03-21 LAB — SODIUM
SODIUM: 145 mmol/L (ref 135–145)
Sodium: 145 mmol/L (ref 135–145)
Sodium: 143 mmol/L (ref 135–145)

## 2015-03-21 LAB — TROPONIN I
Troponin I: 0.07 ng/mL — ABNORMAL HIGH (ref <0.031)
Troponin I: 0.09 ng/mL — ABNORMAL HIGH (ref <0.031)

## 2015-03-21 MED ORDER — POTASSIUM CHLORIDE 10 MEQ/100ML IV SOLN
10.0000 meq | INTRAVENOUS | Status: AC
  Administered 2015-03-21 (×2): 10 meq via INTRAVENOUS
  Filled 2015-03-21 (×2): qty 100

## 2015-03-21 NOTE — Progress Notes (Signed)
STROKE TEAM PROGRESS NOTE   HISTORY [name] is an 81 y.o. female with a past medical history significant for HTN, osteopenia, brought to [facility] via EMS for evaluation of altered mental status, left hemiparesis, dysarthria. Daughter and son in law are at the bedside and stated that they last saw her normal last night (LKW 03/18/2015 at 2100) and Carolyn morning 03/19/2015 they found her sitting in the bathroom acting sleepy, poorly responsive and not speaking". They said that at baseline she is very active and functional and Carolyn was a dramatic change for her and thus EMS was summoned. When EMS arrived they report that [name] was having slurred speech and not moving the left side. BP was apparently not significantly elevated initially and as a matter of fact BP 140/80 in the ED. No recent head trauma. [name] is not taking anticoagulants but has been on baby aspirin for the last couple of months. CT brain performed in the ED was personally reviewed and showed a large intraparenchymal hemorrhage of the right temporal lobe with a 6 mm right frontal temporal acute extra-axial hemorrhage with associated right to left midline shift. Neurosurgery consulted by ED attending and I was informed that [name] was not considered a surgical candidate. Serologies: platelet count 182,000. INR 1.19. In the ED, [name] is drowsy but open her eyes and follows commands. Said that she had a HA the whole day yesterday but denies nausea, vomiting, vertigo, double vision, or language difficulty. She was admitted to the neuro ICU for further evaluation and treatment.   SUBJECTIVE (INTERVAL HISTORY) [name] much more awake Carolyn am, less lethargic. Feel due to 3% started on yesterday. No family present. Pt knows she is in the hospital. **(   OBJECTIVE Temp:  [98.1 F (36.7 C)-102.8 F (39.3 C)] 98.1 F (36.7 C) (07/28 0800) Pulse Rate:  [45-136] 81 (07/28 0800) Cardiac Rhythm:  [-] Normal sinus rhythm;Heart block  (07/28 0800) Resp:  [15-30] 15 (07/28 0800) BP: (112-171)/(38-114) 141/72 mmHg (07/28 0800) SpO2:  [92 %-98 %] 95 % (07/28 0800)  No results for input(s): GLUCAP in the last 168 hours.  Recent Labs Lab 03/19/15 1221 03/20/15 0418 03/20/15 1520 03/20/15 2200 03/21/15 0527  NA 135 133* 137 140 145  K 3.7 3.3*  --   --   --   CL 95* 96*  --   --   --   CO2 28 25  --   --   --   GLUCOSE 147* 171*  --   --   --   BUN 16 12  --   --   --   CREATININE 0.87 0.80  --   --   --   CALCIUM 8.9 8.5*  --   --   --     Recent Labs Lab 03/19/15 1221  AST 24  ALT 27  ALKPHOS 50  BILITOT 1.4*  PROT 6.8  ALBUMIN 3.7    Recent Labs Lab 03/19/15 1221  WBC 11.1*  NEUTROABS 9.2*  HGB 13.0  HCT 38.1  MCV 91.4  PLT 182    Recent Labs Lab 03/20/15 0418 03/20/15 0716 03/20/15 0825 03/20/15 1520 03/20/15 2120 03/21/15 0800  CKTOTAL 66  --  67 88  --   --   CKMB 1.0  --  1.8 6.0*  --   --   TROPONINI  --  0.04* 0.04* 0.10* 0.06* 0.07*    Recent Labs  03/19/15 1221  LABPROT 15.2  INR 1.19  Recent Labs  03/19/15 1324  COLORURINE YELLOW  LABSPEC 1.020  PHURINE 7.0  GLUCOSEU NEGATIVE  HGBUR NEGATIVE  BILIRUBINUR NEGATIVE  KETONESUR NEGATIVE  PROTEINUR NEGATIVE  UROBILINOGEN 0.2  NITRITE NEGATIVE  LEUKOCYTESUR NEGATIVE       Component Value Date/Time   CHOL 203* 04/24/2009 1113   TRIG 133.0 04/24/2009 1113   HDL 55.40 04/24/2009 1113   CHOLHDL 4 04/24/2009 1113   VLDL 26.6 04/24/2009 1113   No results found for: HGBA1C No results found for: LABOPIA, COCAINSCRNUR, LABBENZ, AMPHETMU, THCU, LABBARB  No results for input(s): ETH in the last 168 hours.  Ct Head Wo Contrast 03/20/2015    1. No significant interval change in size of right temporal lobe intraparenchymal hemorrhage with slightly increased localized vasogenic edema. 2. No significant interval change in acute subdural hematoma overlying the right cerebral convexity measuring up to 5 mm in maximal  thickness. Associated 6 mm of right-to-left shift is not significantly changed. 3. Small volume intraventricular hemorrhage, slightly increased from prior. Asymmetric dilatation of the left lateral ventricle is stable. No hydrocephalus. 4. No new intracranial process.     Ct Head Wo Contrast 03/19/2015   Large intraparenchymal hemorrhage of the right temporal lobe with a 6 mm right frontal temporal acute extra-axial hemorrhage. There is right to left midline shift.    Mr Brain Wo Contrast 03/20/2015   1. Stable size of large right temporal lobe parenchymal hemorrhage with layering blood products. 2. Similar appearance of diffuse vasogenic edema and 8 mm midline shift. 3. Stable small right extra-axial hemorrhage. 4. Blood products layering within the ventricles bilaterally without hydrocephalus. 5. Stable chronic small vessel T2 changes.   Mr Jodene Nam Head Wo Contrast 03/20/2015  6. The MRA demonstrates displacement of the MCA branch vessels without a focal lesion to explain the hemorrhage. 7. Diffuse small vessel disease is present on the MRA.     Dg Chest Port 1 View 03/20/2015    Left internal jugular catheter is noted with distal tip in expected position the SVC. No acute cardiopulmonary abnormality seen.       PHYSICAL EXAM Frail elderly Caucasian lady not in distress. . Afebrile. Head is nontraumatic. Neck is supple without bruit.    Cardiac exam no murmur or gallop. Lungs are clear to auscultation. Distal pulses are well felt. Neurological Exam :  Awake alert and interactive. Speech is fluent without dysarthria or aphasia. Diminished attention and recall. Follows commands well. Fundi were not visualized. Pupils irregular but equal and reactive. Vision acuity seems adequate. Left partial homonymous hemianopsia noted. Mild left lower facial asymmetry. Tongue midline.  No upper or lower extremity drift but mild weakness of left grip and hip flexors with diminished fine finger movements on the left and  orbits right over left upper extremity. Deep tendon reflexes are 1+ symmetric. Sensation is preserved bilaterally.. Left plantar is upgoing, right is downgoing. Gait cannot be tested   ASSESSMENT/PLAN [name] is a 81 y.o. female with history of hypertension presenting with altered mental status, left hemiparesis, dysarthria.   Stroke:  Non-dominant large right temporal intraparenchymal hemorrhage with IVH and cerebral edema secondary to either hypertension or cerebral amyloid angiopathy source (given lobar location and relatively normotensive state on arrival lean toward amyloid)  Neurosurgery consulted, not a surgical evacuation/decompression candidate (Nundkumar)  Resultant  Lethargic, left hemiparesis  Was started on mannitol in the ED with bolus followed by q6h dosing. No documented benefit to giving, d/c'd.  MRI  lg R  temporal lobe hmg w/ edema and 8 mm midline shift  MRA  No large vessel occlusion, no source of hemorrhage  SCDs for VTE prophylaxis Diet NPO time specified. ST assessed yest and recommended NPO. Will have them reassess today  aspirin 81 mg orally every day prior to admission  Ongoing aggressive stroke risk factor management. [name] at risk for neurologic worsening, increased hemorrhage.  Therapy recommendations:  Pending. Ok to be OOB today and work with therapy  Continue ICU level care until off BP drip  Disposition:  pending   Cytotoxic Cerebral Edema Induced Hypernatremia  Central Line place  Started on 3% 7/27  Na 145  Continue 3% drip, no adjustments given pt's improved mental status  Bradycardia w/ asystole  Transient HR 20s w/ asystole x 2 first night  HR stable overnight without further episodes  Malignant Hypertension  Home meds:   Cozaar, labetalol  Started on Cardene drip yesterday with Goal:  SBP < 160  BP has been less than goal since 0900 yest  Increase SBP goal to < 180  Start oral hypertensives once taking  pos  Other Stroke Risk Factors  Advanced age  ETOH use  Hypokalemia  K 3.3  replace  Elevated Troponins- mild likely of primary neurogenic etiology- [name] denies chest pain  Leukocytosis  WBC 11.1  TM 102.8  UA and culture normal  Repeat labs in am  Hospital day # [place] for Pager information 03/21/2015 9:32 AM  I have personally examined [name], reviewed notes, independently viewed imaging studies, participated in medical decision making and plan of care. I have made any additions or clarifications directly to the above note. Agree with note above. She has shown significant improvement in mental status with hypertonic saline and plan to continue with sodium goal 150-155 . Repeat CT scan of the head in a.m. Long discussion at the bedside with multiple family members and answered questions. [name] is critically ill and at significant risk of neurological worsening, death and care requires constant monitoring of vital signs, hemodynamics,respiratory and cardiac monitoring, extensive review of multiple databases, frequent neurological assessment, discussion with family, other specialists and medical decision making of high complexity.I have made any additions or clarifications directly to the above note.Carolyn critical care time does not reflect procedure time, or teaching time or supervisory time of PA/NP/Med Resident etc but could involve care discussion time.  I spent 32 minutes of neurocritical care time  in the care of  [name].  Antony Contras, MD Medical Director [facility] Pager: [phone] 03/21/2015 2:31 PM    To contact Stroke Continuity provider, please refer to [email or web address] After hours, contact General Neurology

## 2015-03-21 NOTE — Evaluation (Signed)
Speech Language Pathology Evaluation Patient Details Name: [name] MRN: [id number] DOB: December 04, 1917 Today's Date: 03/21/2015 Time: 1010-1026 SLP Time Calculation (min) (ACUTE ONLY): 16 min  Problem List:  Patient Active Problem List   Diagnosis Date Noted  . Cerebral parenchymal hemorrhage 03/19/2015  . Dizzy spells 11/10/2014  . SPINAL STENOSIS, CERVICAL 06/11/2010  . Essential hypertension 06/22/2006  . Arthritis of right knee 06/22/2006  . Osteopenia 06/22/2006   Past Medical History:  Past Medical History  Diagnosis Date  . Hypertension   . Osteopenia    Past Surgical History:  Past Surgical History  Procedure Laterality Date  . Cataract extraction     HPI:  [name] is a 81 year old female with a past medical history significant for HTN, osteopenia, who was brought to [facility] ED 03/18/15 via EMS for evaluation due to patient being found this AM in a sleepy state, poorly responsive and not speaking. Imaging revealed large right temporal lobe parenchymal hemorrhage, 8 mm midline shift and diffuse small vessel disease.  Patient initially passed RN stroke swallow screen; however, due to [place] orders received for bedside swallow and speech-language evaluation.     Assessment / Plan / Recommendation Clinical Impression  Cognitive-linguisitc evaluation completed.  Patient was initally disoriented to time; however, with repetition x2 patient was able to store and recall this information at the end of the session.  Patient also demonstrates difficulty expressing basic information due to intermittent paraphasic errors, which she was aware of but unable to correct without SLP cues.  Due to fatigue evaluation was limited and further diagnostic treatment of reading, writing and complex problem solving is warranted at next visit.  Patient would benefit from skilled SLP services to maximize cognitive-linguistic recovery.      SLP Assessment  Patient needs continued Speech  [place] Pathology Services    Follow Up Recommendations  Inpatient Rehab    Frequency and Duration min 2x/week  1 week   Pertinent Vitals/Pain Pain Assessment: No/denies pain   SLP Goals  Progression toward goals:  (eval )  SLP Evaluation Prior Functioning  Cognitive/Linguistic Baseline: Baseline deficits Baseline deficit details: mild memory slips per pt report Vocation: Retired   Associate Professor  Overall Cognitive Status: Impaired/Different from baseline Arousal/Alertness: Awake/alert Orientation Level: Oriented to place;Oriented to person;Disoriented to time;Oriented to situation Attention: Sustained Sustained Attention: Appears intact Memory: Impaired Memory Impairment: Decreased recall of new information Awareness: Appears intact Problem Solving: Appears intact (with basic) Safety/Judgment: Appears intact    Comprehension  Auditory Comprehension Overall Auditory Comprehension: Appears within functional limits for tasks assessed Conversation: Complex (Mod I ) Interfering Components: Processing speed Visual Recognition/Discrimination Discrimination: Not tested Reading Comprehension Reading Status: Not tested    Expression Expression Primary Mode of Expression: Verbal Verbal Expression Overall Verbal Expression: Impaired Repetition: No impairment Naming: Impairment Responsive: 76-100% accurate Confrontation: Within functional limits Convergent: Not tested Divergent: Not tested Other Naming Comments: semantic and phonemic paraphasias in conversation with awarenss but inability to correct Verbal Errors: Semantic paraphasias;Phonemic paraphasias Pragmatics: No impairment Effective Techniques: Open ended questions Non-Verbal Means of Communication: Not applicable Written Expression Written Expression: Not tested   Oral / Motor Oral Motor/Sensory Function Overall Oral Motor/Sensory Function: Appears within functional limits for tasks assessed Motor Speech Overall  Motor Speech: Appears within functional limits for tasks assessed   GO    Carmelia Roller., CCC-SLP [phone]  Emaan Gary 03/21/2015, 11:03 AM

## 2015-03-21 NOTE — Evaluation (Signed)
Occupational Therapy Evaluation Patient Details Name: [name] MRN: [id number] DOB: 06/05/1918 Today's Date: 03/21/2015    History of Present Illness [name] is a 81 year old female with a past medical history significant for HTN, osteopenia, who was brought to [facility] ED 03/18/15 via EMS for evaluation due to patient being found this AM in a sleepy state, poorly responsive and not speaking. Imaging revealed large right temporal lobe parenchymal hemorrhage, 8 mm midline shift and diffuse small vessel disease.   Clinical Impression   Pt admitted with above. She demonstrates the below listed deficits and will benefit from continued OT to maximize safety and independence with BADLs.  Pt presents with impaired balance, impaired cognition, ? Impaired vision, bil. UE tremors, and generalized weakness.  Currently, she requires mod - max A for ADLs, and min A +2 for functional mobility.  She lives with her spouse (who is currently at [facility] rehab) in an apt in the basement of her daughter's home.  Feel she would benefit from CIR to allow her to return home at supervision level.       Follow Up Recommendations  CIR;Supervision/Assistance - 24 hour    Equipment Recommendations  None recommended by OT    Recommendations for Other Services       Precautions / Restrictions Precautions Precautions: Fall      Mobility Bed Mobility Overal bed mobility: Needs Assistance Bed Mobility: Supine to Sit;Sit to Supine     Supine to sit: Min assist;+2 for physical assistance Sit to supine: Min assist   General bed mobility comments: pt initiated assist with R UE, but needed truncal assist  Transfers Overall transfer level: Needs assistance Equipment used: 2 person hand held assist Transfers: Sit to/from Stand Sit to Stand: Min assist;+2 physical assistance Stand pivot transfers: Min assist;+2 physical assistance       General transfer comment: cues for hand placement; assist  to come forward and power up.    Balance Overall balance assessment: Needs assistance Sitting-balance support: No upper extremity supported Sitting balance-Leahy Scale: Fair Sitting balance - Comments: Pt requires min A to maintain sitting balance EOB    Standing balance support: No upper extremity supported Standing balance-Leahy Scale: Poor Standing balance comment: requires min A +2 and bil. UE support                             ADL Overall ADL's : Needs assistance/impaired Eating/Feeding: Minimal assistance;Moderate assistance;Sitting Eating/Feeding Details (indicate cue type and reason): min A to drink from cup.  Mod A to manage utensils  Grooming: Wash/dry hands;Wash/dry face;Minimal assistance;Sitting   Upper Body Bathing: Maximal assistance;Sitting   Lower Body Bathing: Maximal assistance;Sit to/from stand   Upper Body Dressing : Maximal assistance;Sitting   Lower Body Dressing: Total assistance;Sit to/from stand   Toilet Transfer: Minimal assistance;Ambulation;BSC   Toileting- Clothing Manipulation and Hygiene: Maximal assistance;Sit to/from stand       Functional mobility during ADLs: +2 for physical assistance;Minimal assistance       Vision Additional Comments: Pt indicates her vision is "not right" while sitting EOB.  She states she can't see well and is fearful of falling.  Pt initially stated that she didn't have visual issues PTA, but at another time, indicated that her vision has not changed.  She did indicate that she has had cataract removal.  She was able to read therapist's nametag.  She was also able to target items  in her peripheral vision  - does not appear to have visual field deficit during functional tasks    Perception Perception Perception Tested?: Yes   Praxis Praxis Praxis tested?: Within functional limits    Pertinent Vitals/Pain Pain Assessment: Faces Faces Pain Scale: Hurts little more Pain Location: R leg, HA Pain  Descriptors / Indicators: Aching Pain Intervention(s): Monitored during session     Hand Dominance Right   Extremity/Trunk Assessment Upper Extremity Assessment Upper Extremity Assessment: Defer to OT evaluation RUE Deficits / Details: tremors noted - pt indicates these are new, but no family present to determine her baseline  RUE Coordination: decreased fine motor;decreased gross motor LUE Deficits / Details: tremors noted - pt indicates these are new, but no family present to determine her baseline LUE Coordination: decreased fine motor;decreased gross motor   Lower Extremity Assessment Lower Extremity Assessment: Overall WFL for tasks assessed;Generalized weakness   Cervical / Trunk Assessment Cervical / Trunk Assessment: Kyphotic   Communication Communication Communication: Expressive difficulties   Cognition Arousal/Alertness: Awake/alert Behavior During Therapy: WFL for tasks assessed/performed Overall Cognitive Status: Impaired/Different from baseline Area of Impairment: Orientation;Attention;Memory;Following commands;Safety/judgement;Awareness;Problem solving Orientation Level: Disoriented to;Place;Time;Situation Current Attention Level: Sustained Memory: Decreased short-term memory Following Commands: Follows one step commands consistently Safety/Judgement: Decreased awareness of deficits   Problem Solving: Slow processing;Decreased initiation;Difficulty sequencing;Requires verbal cues;Requires tactile cues General Comments: Pt thinks that is it nighttime, is unaware of why she is in hospital and which hospital she is in.  She is very slow to process information    General Comments       Exercises       Shoulder Instructions      Home Living Family/patient expects to be discharged to:: Private residence Living Arrangements: Children Available Help at Discharge: Family (unsure) Type of Home: House Home Access: Stairs to enter     Home Layout: Two level;Able  to live on main level with bedroom/bathroom Alternate Level Stairs-Number of Steps: flight - pt has stair lift    Bathroom Shower/Tub: Occupational psychologist: Handicapped height Bathroom Accessibility: Yes How Accessible: Accessible via wheelchair Home Equipment: Shower seat - built in;Grab bars - tub/shower;Grab bars - toilet   Additional Comments: Pt and spouse live with her daughter and son in [place].  They have an apartment in the basement of the house.  However, they have to walk around to the back of the house to access the door to their apartment.  Or, they can enter in the front door and has stair lift to basement apartment       Prior Functioning/Environment Level of Independence: Independent        Comments: Pt reports she was independent with ADLs. Occasionally sat to shower, but often stands.  Does not use AD for ambulation.  Spouse is currently in SNF rehab     OT Diagnosis: Generalized weakness;Cognitive deficits   OT Problem List: Decreased strength;Decreased activity tolerance;Impaired balance (sitting and/or standing);Impaired vision/perception;Decreased coordination;Decreased cognition;Decreased safety awareness;Decreased knowledge of use of DME or AE;Impaired UE functional use   OT Treatment/Interventions: Self-care/ADL training;Neuromuscular education;DME and/or AE instruction;Therapeutic activities;Cognitive remediation/compensation;Visual/perceptual remediation/compensation;Patient/family education;Balance training    OT Goals(Current goals can be found in the care plan section) Acute Rehab OT Goals Patient Stated Goal: To get better  OT Goal Formulation: With patient Time For Goal Achievement: 04/04/15 Potential to Achieve Goals: Good ADL Goals Pt Will Perform Eating: with set-up;sitting Pt Will Perform Grooming: with min assist;standing Pt Will Perform Upper Body Bathing: with  set-up;with supervision;sitting Pt Will Perform Lower Body Bathing: with  min assist;sit to/from stand Pt Will Transfer to Toilet: ambulating;with min guard assist;regular height toilet;bedside commode;grab bars Pt Will Perform Toileting - Clothing Manipulation and hygiene: with min guard assist;sit to/from stand  OT Frequency: Min 2X/week   Barriers to D/C: Other (comment) (unsure )          Co-evaluation PT/OT/SLP Co-Evaluation/Treatment: Yes Reason for Co-Treatment: Complexity of the patient's impairments (multi-system involvement) PT goals addressed during session: Mobility/safety with mobility OT goals addressed during session: ADL's and self-care      End of Session Nurse Communication: Mobility status  Activity Tolerance: Patient tolerated treatment well Patient left: in bed;with call bell/phone within reach;with family/visitor present   Time: 8786-7672 OT Time Calculation (min): 28 min Charges:  OT General Charges $OT Visit: 1 Procedure OT Evaluation $Initial OT Evaluation Tier I: 1 Procedure G-Codes:    Heena Woodbury, Ellard Artis M April 19, 2015, 5:22 PM

## 2015-03-21 NOTE — Progress Notes (Signed)
Speech Language Pathology Treatment: Dysphagia  Patient Details Name: [name] MRN: [id number] DOB: [birth year] Today's Date: 03/21/2015 Time: 3343-5686 SLP Time Calculation (min) (ACUTE ONLY): 13 min  Assessment / Plan / Recommendation Clinical Impression  Skilled treatment session focused on addressing goals for PO readiness.  Patient awake and alert today and requesting something to drink.  Oral care was performed with hand-over-hand assist due to patient experiencing difficulty with coordination of right upper extremity.  Given increased arousal patient was able to consume regular textures and thin liquids with throat clear following straw sips only.  SLP also provided Min verbal cues for small sips as a precaution.  Given increase arousal today recommend initiation of a regular texture and thin liquid diet with full staff assist for self-feeding and supervision to assist with recall of aspiration precautions.  SLP will continue to follow to ensure toleration.    HPI Other Pertinent Information: [name] is a 81 year old female with a past medical history significant for HTN, osteopenia, who was brought to [facility] ED 03/18/15 via EMS for evaluation due to patient being found this AM in a sleepy state, poorly responsive and not speaking. Imaging revealed large right temporal lobe parenchymal hemorrhage, 8 mm midline shift and diffuse small vessel disease.  Patient initially passed RN stroke swallow screen; however, due to [place] orders received for bedside swallow and speech-language evaluation.     Pertinent Vitals Pain Assessment: No/denies pain  SLP Plan  Goals updated    Recommendations Diet recommendations: Regular;Thin liquid Liquids provided via: Cup;No straw Medication Administration: Whole meds with puree Supervision: Staff to assist with self feeding;Full supervision/cueing for compensatory strategies Compensations: Slow rate;Small sips/bites Postural Changes and/or  Swallow Maneuvers: Seated upright 90 degrees              General recommendations: Rehab consult Oral Care Recommendations: Oral care BID Follow up Recommendations: Inpatient Rehab Plan: Goals updated    GO    [name]., CCC-SLP [phone]  [place] 03/21/2015, 10:41 AM

## 2015-03-21 NOTE — Evaluation (Signed)
Physical Therapy Evaluation Patient Details Name: [name] MRN: [id number] DOB: 1917-11-17 Today's Date: 03/21/2015   History of Present Illness  [name] is a 81 year old female with a past medical history significant for HTN, osteopenia, who was brought to [facility] ED 03/18/15 via EMS for evaluation due to patient being found this AM in a sleepy state, poorly responsive and not speaking. Imaging revealed large right temporal lobe parenchymal hemorrhage, 8 mm midline shift and diffuse small vessel disease.  Clinical Impression  Pt admitted with/for R temporal lobe parenchymal hemorrhage..  Pt currently limited functionally due to the problems listed. ( See problems list.)   Pt will benefit from PT to maximize function and safety in order to get ready for next venue listed below.     Follow Up Recommendations CIR    Equipment Recommendations  Other (comment) (TBA)    Recommendations for Other Services Rehab consult     Precautions / Restrictions Precautions Precautions: Fall      Mobility  Bed Mobility Overal bed mobility: Needs Assistance Bed Mobility: Supine to Sit;Sit to Supine     Supine to sit: Min assist;+2 for physical assistance Sit to supine: Min assist   General bed mobility comments: pt initiated assist with R UE, but needed truncal assist  Transfers Overall transfer level: Needs assistance Equipment used: 2 person hand held assist Transfers: Sit to/from Stand Sit to Stand: Min assist;+2 physical assistance Stand pivot transfers: Min assist;+2 physical assistance       General transfer comment: cues for hand placement; assist to come forward and power up.  Ambulation/Gait Ambulation/Gait assistance: Min assist;+2 physical assistance Ambulation Distance (Feet): 3 Feet (forward and back with 2 person HHA) Assistive device: 2 person hand held assist Gait Pattern/deviations: Step-through pattern;Trunk flexed;Wide base of support     General  Gait Details:  unsteady, tentative steps  Stairs            Wheelchair Mobility    Modified Rankin (Stroke Patients Only) Modified Rankin (Stroke Patients Only) Pre-Morbid Rankin Score: No symptoms Modified Rankin: Moderately severe disability     Balance Overall balance assessment: Needs assistance Sitting-balance support: No upper extremity supported Sitting balance-Leahy Scale: Fair Sitting balance - Comments: Pt requires min A to maintain sitting balance EOB    Standing balance support: No upper extremity supported Standing balance-Leahy Scale: Poor Standing balance comment: requires min A +2 and bil. UE support                              Pertinent Vitals/Pain Pain Assessment: Faces Faces Pain Scale: Hurts little more Pain Location: R leg, HA Pain Descriptors / Indicators: Aching Pain Intervention(s): Monitored during session    Home Living Family/patient expects to be discharged to:: Private residence Living Arrangements: Children Available Help at Discharge: Family (unsure) Type of Home: House Home Access: Stairs to enter     Home Layout: Two level;Able to live on main level with bedroom/bathroom Home Equipment: Shower seat - built in;Grab bars - tub/shower;Grab bars - toilet Additional Comments: Pt and spouse live with her daughter and son in [place].  They have an apartment in the basement of the house.  However, they have to walk around to the back of the house to access the door to their apartment.  Or, they can enter in the front door and has stair lift to basement apartment     Prior Function Level  of Independence: Independent         Comments: Pt reports she was independent with ADLs. Occasionally sat to shower, but often stands.  Does not use AD for ambulation.  Spouse is currently in SNF rehab      Hand Dominance   Dominant Hand: Right    Extremity/Trunk Assessment   Upper Extremity Assessment: Defer to OT evaluation RUE  Deficits / Details: tremors noted - pt indicates these are new, but no family present to determine her baseline      LUE Deficits / Details: tremors noted - pt indicates these are new, but no family present to determine her baseline   Lower Extremity Assessment: Overall WFL for tasks assessed;Generalized weakness      Cervical / Trunk Assessment: Kyphotic  Communication   Communication: Expressive difficulties  Cognition Arousal/Alertness: Awake/alert Behavior During Therapy: WFL for tasks assessed/performed Overall Cognitive Status: Impaired/Different from baseline Area of Impairment: Orientation;Attention;Memory;Following commands;Safety/judgement;Awareness;Problem solving Orientation Level: Disoriented to;Place;Time;Situation Current Attention Level: Sustained Memory: Decreased short-term memory Following Commands: Follows one step commands consistently Safety/Judgement: Decreased awareness of deficits   Problem Solving: Slow processing;Decreased initiation;Difficulty sequencing;Requires verbal cues;Requires tactile cues General Comments: Pt thinks that is it nighttime, is unaware of why she is in hospital and which hospital she is in.  She is very slow to process information     General Comments      Exercises        Assessment/Plan    PT Assessment Patient needs continued PT services  PT Diagnosis Difficulty walking;Generalized weakness   PT Problem List Decreased strength;Decreased activity tolerance;Decreased balance;Decreased mobility;Decreased knowledge of use of DME;Decreased coordination  PT Treatment Interventions Gait training;DME instruction;Functional mobility training;Therapeutic activities;Balance training;Neuromuscular re-education;Patient/family education   PT Goals (Current goals can be found in the Care Plan section) Acute Rehab PT Goals Patient Stated Goal: To get better  PT Goal Formulation: With patient Time For Goal Achievement: 04/04/15 Potential  to Achieve Goals: Good    Frequency Min 3X/week   Barriers to discharge        Co-evaluation PT/OT/SLP Co-Evaluation/Treatment: Yes Reason for Co-Treatment: Complexity of the patient's impairments (multi-system involvement) PT goals addressed during session: Mobility/safety with mobility OT goals addressed during session: ADL's and self-care       End of Session   Activity Tolerance: Patient tolerated treatment well Patient left: in bed;with call bell/phone within reach;with family/visitor present Nurse Communication: Mobility status         Time: 0177-9390 PT Time Calculation (min) (ACUTE ONLY): 28 min   Charges:   PT Evaluation $Initial PT Evaluation Tier I: 1 Procedure     PT G Codes:        Dakarri Kessinger, Tessie Fass 03/21/2015, 5:21 PM  03/21/2015  Donnella Sham, [place] [phone]  (pager)

## 2015-03-22 ENCOUNTER — Inpatient Hospital Stay (HOSPITAL_COMMUNITY): Payer: Medicare Other

## 2015-03-22 DIAGNOSIS — I10 Essential (primary) hypertension: Secondary | ICD-10-CM

## 2015-03-22 DIAGNOSIS — I69398 Other sequelae of cerebral infarction: Secondary | ICD-10-CM

## 2015-03-22 DIAGNOSIS — I6931 Cognitive deficits following cerebral infarction: Secondary | ICD-10-CM

## 2015-03-22 DIAGNOSIS — G935 Compression of brain: Secondary | ICD-10-CM

## 2015-03-22 DIAGNOSIS — R269 Unspecified abnormalities of gait and mobility: Secondary | ICD-10-CM

## 2015-03-22 DIAGNOSIS — G936 Cerebral edema: Secondary | ICD-10-CM

## 2015-03-22 DIAGNOSIS — I611 Nontraumatic intracerebral hemorrhage in hemisphere, cortical: Principal | ICD-10-CM

## 2015-03-22 LAB — CBC
HCT: 34.2 % — ABNORMAL LOW (ref 36.0–46.0)
Hemoglobin: 11.4 g/dL — ABNORMAL LOW (ref 12.0–15.0)
MCH: 30.9 pg (ref 26.0–34.0)
MCHC: 33.3 g/dL (ref 30.0–36.0)
MCV: 92.7 fL (ref 78.0–100.0)
PLATELETS: 182 10*3/uL (ref 150–400)
RBC: 3.69 MIL/uL — ABNORMAL LOW (ref 3.87–5.11)
RDW: 13.1 % (ref 11.5–15.5)
WBC: 11.5 10*3/uL — ABNORMAL HIGH (ref 4.0–10.5)

## 2015-03-22 LAB — BASIC METABOLIC PANEL
Anion gap: 8 (ref 5–15)
BUN: 12 mg/dL (ref 6–20)
CO2: 29 mmol/L (ref 22–32)
CREATININE: 0.8 mg/dL (ref 0.44–1.00)
Calcium: 7.6 mg/dL — ABNORMAL LOW (ref 8.9–10.3)
Chloride: 105 mmol/L (ref 101–111)
GFR calc Af Amer: >60 mL/min (ref >60)
GFR calc non Af Amer: >60 mL/min (ref >60)
GLUCOSE: 148 mg/dL — AB (ref 65–99)
POTASSIUM: 2.3 mmol/L — AB (ref 3.5–5.1)
SODIUM: 142 mmol/L (ref 135–145)

## 2015-03-22 LAB — SODIUM
SODIUM: 141 mmol/L (ref 135–145)
Sodium: 136 mmol/L (ref 135–145)
Sodium: 136 mmol/L (ref 135–145)

## 2015-03-22 MED ORDER — PANTOPRAZOLE SODIUM 40 MG PO TBEC
40.0000 mg | DELAYED_RELEASE_TABLET | Freq: Every day | ORAL | Status: DC
  Administered 2015-03-23 – 2015-03-28 (×5): 40 mg via ORAL
  Filled 2015-03-22 (×7): qty 1

## 2015-03-22 MED ORDER — POTASSIUM CHLORIDE 10 MEQ/50ML IV SOLN
10.0000 meq | INTRAVENOUS | Status: AC
  Administered 2015-03-22 (×4): 10 meq via INTRAVENOUS
  Filled 2015-03-22 (×4): qty 50

## 2015-03-22 MED ORDER — LOSARTAN POTASSIUM 50 MG PO TABS
100.0000 mg | ORAL_TABLET | Freq: Every day | ORAL | Status: DC
  Administered 2015-03-22 – 2015-03-29 (×7): 100 mg via ORAL
  Filled 2015-03-22 (×9): qty 2

## 2015-03-22 MED ORDER — LABETALOL HCL 5 MG/ML IV SOLN
INTRAVENOUS | Status: AC
  Administered 2015-03-22: 20 mg
  Filled 2015-03-22: qty 4

## 2015-03-22 MED ORDER — LABETALOL HCL 5 MG/ML IV SOLN: 20.0000 mg | Freq: Once | INTRAVENOUS | Status: AC

## 2015-03-22 MED ORDER — LABETALOL HCL 100 MG PO TABS
100.0000 mg | ORAL_TABLET | Freq: Two times a day (BID) | ORAL | Status: DC
  Administered 2015-03-22 – 2015-03-24 (×5): 100 mg via ORAL
  Filled 2015-03-22 (×7): qty 1

## 2015-03-22 NOTE — Progress Notes (Signed)
Speech Language Pathology Treatment: Dysphagia;Cognitive-Linquistic  Patient Details Name: [name] MRN: [id number] DOB: 1918/01/13 Today's Date: 03/22/2015 Time: 6811-5726 SLP Time Calculation (min) (ACUTE ONLY): 30 min  Assessment / Plan / Recommendation Clinical Impression  Skilled treatment session focused on addressing toleration of current diet.  SLP provided set up assist and skilled observation of patient self-feeding regular textures and thin liquids via cup.  Patient required increased time for oral clearance of regular textures and Mod verbal and tactile cues for portion control with cup sips.  SLP attempted to fade cues to Min assist level, which resulted in 3-4 sips at a time with no overt s/s of aspiration.  Continue with current orders.  Skilled treatment session also focused on addressing cognitive-linguistic goals.  Patient was initially disorientated; however was able to reorient self with Min cues to utilize external aids.  Patient demonstrated verbal repetition of phrases and ends of sentences which SLP initially thought was used while that patient searched for the word she wanted to use; however today during functional tasks such as reading, writing and calculations it was evident that patient demonstrates difficulty with verbal and functional organization and timely cessation of tasks.  Suspect this is also a component that impacts her ability to safely self-feed safely.  Goals modified.      HPI Other Pertinent Information: [name] is a 81 year old female with a past medical history significant for HTN, osteopenia, who was brought to [facility] ED 03/18/15 via EMS for evaluation due to patient being found this AM in a sleepy state, poorly responsive and not speaking. Imaging revealed large right temporal lobe parenchymal hemorrhage, 8 mm midline shift and diffuse small vessel disease.  Patient initially passed RN stroke swallow screen; however, due to [place] orders  received for bedside swallow and speech-language evaluation.     Pertinent Vitals Pain Assessment: No/denies pain Faces Pain Scale: Hurts little more Pain Location: vague Pain Descriptors / Indicators: Moaning Pain Intervention(s): Monitored during session  SLP Plan  Continue with current plan of care    Recommendations Diet recommendations: Regular;Thin liquid Liquids provided via: Cup;No straw Medication Administration: Whole meds with puree Supervision: Staff to assist with self feeding;Full supervision/cueing for compensatory strategies Compensations: Slow rate;Small sips/bites Postural Changes and/or Swallow Maneuvers: Seated upright 90 degrees              General recommendations: Rehab consult Oral Care Recommendations: Oral care BID Follow up Recommendations: Inpatient Rehab Plan: Continue with current plan of care    GO    Carmelia Roller., CCC-SLP [phone]  [place] 03/22/2015, 3:41 PM

## 2015-03-22 NOTE — Progress Notes (Signed)
Physical Therapy Treatment Patient Details Name: [name] MRN: [id number] DOB: Jul 30, 1918 Today's Date: 03/22/2015.    History of Present Illness [name] is a 81 year old female with a past medical history significant for HTN, osteopenia, who was brought to [facility] ED 03/18/15 via EMS for evaluation due to patient being found this AM in a sleepy state, poorly responsive and not speaking. Imaging revealed large right temporal lobe parenchymal hemorrhage, 8 mm midline shift and diffuse small vessel disease.    PT Comments    Progressing steadily.  Pt unsteady with gait, but was able to walk around in the room.  Follow Up Recommendations  CIR     Equipment Recommendations   (TBA)    Recommendations for Other Services       Precautions / Restrictions Precautions Precautions: Fall    Mobility  Bed Mobility   Bed Mobility: Supine to Sit     Supine to sit: Min assist     General bed mobility comments: pt initiated swinging legs off the bed and coming up on R elbow.  need minor trunc stability  Transfers Overall transfer level: Needs assistance   Transfers: Sit to/from Stand Sit to Stand: Min guard;+2 physical assistance         General transfer comment: cues for hand placement; assist to come forward and power up.  Ambulation/Gait Ambulation/Gait assistance: Min assist Ambulation Distance (Feet): 10 Feet (14 feet to recliner) Assistive device: Rolling walker (2 wheeled) Gait Pattern/deviations: Decreased step length - right;Decreased step length - left;Step-through pattern Gait velocity: very slow and tentative   General Gait Details:  unsteady, tentative steps   Stairs            Wheelchair Mobility    Modified Rankin (Stroke Patients Only) Modified Rankin (Stroke Patients Only) Pre-Morbid Rankin Score: No symptoms Modified Rankin: Moderately severe disability     Balance Overall balance assessment: Needs  assistance Sitting-balance support: No upper extremity supported Sitting balance-Leahy Scale: Fair Sitting balance - Comments: min guard on the toilet     Standing balance-Leahy Scale: Poor Standing balance comment: needs RW or HHA                    Cognition Arousal/Alertness: Awake/alert Behavior During Therapy: WFL for tasks assessed/performed Overall Cognitive Status: Impaired/Different from baseline Area of Impairment: Orientation;Attention;Memory;Following commands;Safety/judgement;Awareness;Problem solving Orientation Level: Disoriented to;Place;Time;Situation Current Attention Level: Sustained Memory: Decreased short-term memory Following Commands: Follows one step commands consistently Safety/Judgement: Decreased awareness of deficits   Problem Solving: Slow processing      Exercises      General Comments        Pertinent Vitals/Pain Pain Assessment: Faces Faces Pain Scale: Hurts little more Pain Location: vague Pain Descriptors / Indicators: Moaning Pain Intervention(s): Monitored during session    Home Living                      Prior Function            PT Goals (current goals can now be found in the care plan section) Acute Rehab PT Goals Patient Stated Goal: To get better  PT Goal Formulation: With patient Time For Goal Achievement: 04/04/15 Potential to Achieve Goals: Good Progress towards PT goals: Progressing toward goals    Frequency  Min 3X/week    PT Plan Current plan remains appropriate    Co-evaluation             End  of Session   Activity Tolerance: Patient tolerated treatment well Patient left: in chair;with call bell/phone within reach     Time: 1136-1159 PT Time Calculation (min) (ACUTE ONLY): 23 min  Charges:  $Gait Training: 8-22 mins $Therapeutic Activity: 8-22 mins                    G Codes:      Shamell Hittle, Tessie Fass 03/22/2015, 1:42 PM 03/22/2015  Donnella Sham,  PT [phone]  (pager)

## 2015-03-22 NOTE — Progress Notes (Signed)
Rehab Admissions Coordinator Note:  Patient was screened by [name] for appropriateness for an Inpatient Acute Rehab Consult.  At this time, we are recommending Inpatient Rehab consult.  [name] 03/22/2015, 8:48 AM  I can be reached at [phone].

## 2015-03-22 NOTE — Progress Notes (Signed)
Rehab admissions - Evaluated for possible admission.  I have opened the case with [facility] medicare in anticipation that patient may need acute inpatient rehab admission next week.  Noted patient not medically ready yet.  I will follow up on Monday for plans and progress.  Call me for questions.  #[phone]

## 2015-03-22 NOTE — Progress Notes (Addendum)
STROKE TEAM PROGRESS NOTE   HISTORY [name] is an 81 y.o. female with a past medical history significant for HTN, osteopenia, brought to [facility] via EMS for evaluation of altered mental status, left hemiparesis, dysarthria. Daughter and son in law are at the bedside and stated that they last saw her normal last night (LKW 03/18/2015 at 2100) and this morning 03/19/2015 they found her sitting in the bathroom acting sleepy, poorly responsive and not speaking". They said that at baseline she is very active and functional and this was a dramatic change for her and thus EMS was summoned. When EMS arrived they report that patient was having slurred speech and not moving the left side. BP was apparently not significantly elevated initially and as a matter of fact BP 140/80 in the ED. No recent head trauma. Patient is not taking anticoagulants but has been on baby aspirin for the last couple of months. CT brain performed in the ED was personally reviewed and showed a large intraparenchymal hemorrhage of the right temporal lobe with a 6 mm right frontal temporal acute extra-axial hemorrhage with associated right to left midline shift. Neurosurgery consulted by ED attending and I was informed that patient was not considered a surgical candidate. Serologies: platelet count 182,000. INR 1.19. In the ED, Mrs. [name] is drowsy but open her eyes and follows commands. Said that she had a HA the whole day yesterday but denies nausea, vomiting, vertigo, double vision, or language difficulty. She was admitted to the neuro ICU for further evaluation and treatment.   SUBJECTIVE (INTERVAL HISTORY) Patient remains awake this am, remains on 3% but sodium only 142 No family present.     OBJECTIVE Temp:  [97.8 F (36.6 C)-100.3 F (37.9 C)] 98 F (36.7 C) (07/29 1158) Pulse Rate:  [50-163] 163 (07/29 1100) Cardiac Rhythm:  [-] Normal sinus rhythm;Heart block (07/28 1930) Resp:  [14-32] 16 (07/29 1100) BP:  (123-183)/(53-86) 124/86 mmHg (07/29 1100) SpO2:  [92 %-99 %] 97 % (07/29 1100)  No results for input(s): GLUCAP in the last 168 hours.  Recent Labs Lab 03/19/15 1221 03/20/15 0418  03/21/15 0527 03/21/15 1230 03/21/15 2150 03/22/15 0400 03/22/15 1015  NA 135 133*  < > 145 145 143 142 141  K 3.7 3.3*  --   --   --   --  2.3*  --   CL 95* 96*  --   --   --   --  105  --   CO2 28 25  --   --   --   --  29  --   GLUCOSE 147* 171*  --   --   --   --  148*  --   BUN 16 12  --   --   --   --  12  --   CREATININE 0.87 0.80  --   --   --   --  0.80  --   CALCIUM 8.9 8.5*  --   --   --   --  7.6*  --   < > = values in this interval not displayed.  Recent Labs Lab 03/19/15 1221  AST 24  ALT 27  ALKPHOS 50  BILITOT 1.4*  PROT 6.8  ALBUMIN 3.7    Recent Labs Lab 03/19/15 1221 03/22/15 0400  WBC 11.1* 11.5*  NEUTROABS 9.2*  --   HGB 13.0 11.4*  HCT 38.1 34.2*  MCV 91.4 92.7  PLT 182 182    Recent  Labs Lab 03/20/15 0418  03/20/15 0825 03/20/15 1520 03/20/15 2120 03/21/15 0800 03/21/15 1230  CKTOTAL 66  --  67 88  --   --   --   CKMB 1.0  --  1.8 6.0*  --   --   --   TROPONINI  --   < > 0.04* 0.10* 0.06* 0.07* 0.09*  < > = values in this interval not displayed. No results for input(s): LABPROT, INR in the last 72 hours.  Recent Labs  03/19/15 1324  COLORURINE YELLOW  LABSPEC 1.020  PHURINE 7.0  GLUCOSEU NEGATIVE  HGBUR NEGATIVE  BILIRUBINUR NEGATIVE  KETONESUR NEGATIVE  PROTEINUR NEGATIVE  UROBILINOGEN 0.2  NITRITE NEGATIVE  LEUKOCYTESUR NEGATIVE       Component Value Date/Time   CHOL 203* 04/24/2009 1113   TRIG 133.0 04/24/2009 1113   HDL 55.40 04/24/2009 1113   CHOLHDL 4 04/24/2009 1113   VLDL 26.6 04/24/2009 1113   No results found for: HGBA1C No results found for: LABOPIA, COCAINSCRNUR, LABBENZ, AMPHETMU, THCU, LABBARB  No results for input(s): ETH in the last 168 hours.  Ct Head Wo Contrast 03/20/2015    1. No significant interval change  in size of right temporal lobe intraparenchymal hemorrhage with slightly increased localized vasogenic edema. 2. No significant interval change in acute subdural hematoma overlying the right cerebral convexity measuring up to 5 mm in maximal thickness. Associated 6 mm of right-to-left shift is not significantly changed. 3. Small volume intraventricular hemorrhage, slightly increased from prior. Asymmetric dilatation of the left lateral ventricle is stable. No hydrocephalus. 4. No new intracranial process.     Ct Head Wo Contrast 03/19/2015   Large intraparenchymal hemorrhage of the right temporal lobe with a 6 mm right frontal temporal acute extra-axial hemorrhage. There is right to left midline shift.    Mr Brain Wo Contrast 03/20/2015   1. Stable size of large right temporal lobe parenchymal hemorrhage with layering blood products. 2. Similar appearance of diffuse vasogenic edema and 8 mm midline shift. 3. Stable small right extra-axial hemorrhage. 4. Blood products layering within the ventricles bilaterally without hydrocephalus. 5. Stable chronic small vessel T2 changes.   Mr [name] Head Wo Contrast 03/20/2015  6. The MRA demonstrates displacement of the MCA branch vessels without a focal lesion to explain the hemorrhage. 7. Diffuse small vessel disease is present on the MRA.     Dg Chest Port 1 View 03/20/2015    Left internal jugular catheter is noted with distal tip in expected position the SVC. No acute cardiopulmonary abnormality seen.     CT Head 03/22/2015 : Expected evolution of multifocal intracranial hemorrhage without significant interval progression. The small subdural hemorrhage overlying the right temporal lobe is significantly less conspicuous on today's exam, the intraparenchymal hemorrhage within the right temporal lobe and small volume intraventricular hemorrhage layering within both occipital horns are essentially unchanged.   PHYSICAL EXAM Frail elderly Caucasian lady not in  distress. . Afebrile. Head is nontraumatic. Neck is supple without bruit.    Cardiac exam no murmur or gallop. Lungs are clear to auscultation. Distal pulses are well felt. Neurological Exam :  Awake alert and interactive. Speech is fluent without dysarthria or aphasia. Diminished attention and recall. Follows commands well. Fundi were not visualized. Pupils irregular but equal and reactive. Vision acuity seems adequate. Left partial homonymous hemianopsia noted. Mild left lower facial asymmetry. Tongue midline.  No upper or lower extremity drift but mild weakness of left grip and hip flexors with  diminished fine finger movements on the left and orbits right over left upper extremity. Deep tendon reflexes are 1+ symmetric. Sensation is preserved bilaterally.. Left plantar is upgoing, right is downgoing. Gait cannot be tested   ASSESSMENT/PLAN Ms. CHANNELLE BOTTGER is a 81 y.o. female with history of hypertension presenting with altered mental status, left hemiparesis, dysarthria.   Stroke:  Non-dominant large right temporal intraparenchymal hemorrhage with IVH and cerebral edema secondary to either hypertension or cerebral amyloid angiopathy source (given lobar location and relatively normotensive state on arrival lean toward amyloid)  Neurosurgery consulted, not a surgical evacuation/decompression candidate (Nundkumar)  Resultant  Lethargic, left hemiparesis  Was started on mannitol in the ED with bolus followed by q6h dosing. No documented benefit to giving, d/c'd.  MRI  lg R temporal lobe hmg w/ edema and 8 mm midline shift  MRA  No large vessel occlusion, no source of hemorrhage  SCDs for VTE prophylaxis Diet regular Room service appropriate?: Yes; Fluid consistency:: Thin. ST assessed yest and recommended NPO. Will have them reassess today  aspirin 81 mg orally every day prior to admission  Ongoing aggressive stroke risk factor management. Patient at risk for neurologic worsening,  increased hemorrhage.  Therapy recommendations:  CLR. Ok to be OOB today and work with therapy  Continue ICU level care until off BP drip Disposition:  CLR Cytotoxic Cerebral Edema Induced Hypernatremia  [employer] place  Started on 3% 7/27  Na 145  Continue 3% drip, no adjustments given pt's improved mental status  Bradycardia w/ asystole  Transient HR 20s w/ asystole x 2 first night  HR stable overnight without further episodes  Malignant Hypertension  Home meds:   Cozaar, labetalol  Started on Cardene drip yesterday with Goal:  SBP < 160  BP has been less than goal since 0900 yest  Increase SBP goal to < 180  Start oral hypertensives once taking pos  Other Stroke Risk Factors  Advanced age  ETOH use  Hypokalemia  K 3.3  replace  Elevated Troponins- mild likely of primary neurogenic etiology- patient denies chest pain  Induced Hypernatremia for cytotoxic edema- on 3% saline- sodium 142 today Leukocytosis  WBC 11.1  TM 102.8  UA and culture normal     Hospital day # [place] for Pager information 03/22/2015 1:08 PM  I have personally examined this patient, reviewed notes, independently viewed imaging studies, participated in medical decision making and plan of care. I have made any additions or clarifications directly to the above note. Agree with note above. She has shown significant improvement in mental status with hypertonic saline  . Repeat CT scan of the head shows stable hematoma and midline shift.  Plan to taper 3% saline drip over next 24 hours. Route rehabilitation M.D. consult. Mobilize out of bed. PT or OT  This patient is critically ill and at significant risk of neurological worsening, death and care requires constant monitoring of vital signs, hemodynamics,respiratory and cardiac monitoring, extensive review of multiple databases, frequent neurological assessment, discussion with family,  other specialists and medical decision making of high complexity.I have made any additions or clarifications directly to the above note.This critical care time does not reflect procedure time, or teaching time or supervisory time of PA/NP/Med Resident etc but could involve care discussion time.  I spent 30 minutes of neurocritical care time  in the care of  this patient.  Antony Contras, MD Medical Director [facility]  Pager: [phone] 03/22/2015 1:08 PM    To contact Stroke Continuity provider, please refer to [email or web address] After hours, contact General Neurology

## 2015-03-22 NOTE — Consult Note (Signed)
Physical Medicine and Rehabilitation Consult Reason for Consult: Right temporal intraparenchymal hemorrhage Referring Physician: Dr.Sethi    HPI: [name] is a 81 y.o. right handed female with history of hypertension. Independent prior to admission without assistive device and still driving. Presented 03/19/2015 with altered mental status left-sided weakness and slurred speech. Blood pressure in the ED was 140/80. CT of the head and imaging showed a large right temporal intraparenchymal hematoma. Right to left midline shift of 6 mm. Troponin negative. Neurosurgery consulted and advised conservative care. Follow-up MRI of the brain stable without hydrocephalus. MRA of the head with diffuse small vessel disease. Maintain on a regular diet. Physical and occupational therapy evaluation completed 03/21/2015 with recommendations of physical medicine rehabilitation consult  Patient states she has food stuck in her mouth and needed a drink of water. She denies any coughing or choking on fluids or solids. Patient is sitting up in a chair she has no pain complaints Review of Systems  Constitutional: Negative for fever and chills.  HENT: Positive for hearing loss.   Eyes: Negative for blurred vision and double vision.  Respiratory: Negative for cough and shortness of breath.   Cardiovascular: Negative for chest pain, palpitations and leg swelling.  Gastrointestinal: Positive for constipation. Negative for heartburn and nausea.  Genitourinary: Negative for dysuria and urgency.  Musculoskeletal: Positive for myalgias and joint pain.  Skin: Negative for rash.  Neurological: Positive for headaches. Negative for dizziness and tingling.   Past Medical History  Diagnosis Date  . Hypertension   . Osteopenia    Past Surgical History  Procedure Laterality Date  . Cataract extraction     Family History  Problem Relation Age of Onset  . Cancer Daughter     breast   Social History:   reports that she has never smoked. She has never used smokeless tobacco. She reports that she drinks about 0.6 oz of alcohol per week. She reports that she does not use illicit drugs. Allergies:  Allergies  Allergen Reactions  . Ace Inhibitors     REACTION: angioedema   Medications Prior to Admission  Medication Sig Dispense Refill  . aspirin EC 81 MG tablet Take 81 mg by mouth daily.    Marland Kitchen labetalol (NORMODYNE) 100 MG tablet TAKE 1 TABLET BY MOUTH TWICE A DAY 60 tablet 3  . losartan (COZAAR) 100 MG tablet Take 1 tablet (100 mg total) by mouth daily. 30 tablet 5  . Multiple Vitamin (MULTIVITAMIN) tablet Take 1 tablet by mouth daily.        Home: Home Living Family/patient expects to be discharged to:: Private residence Living Arrangements: Children Available Help at Discharge: Family (unsure) Type of Home: House Home Access: Stairs to enter Home Layout: Two level, Able to live on main level with bedroom/bathroom Alternate Level Stairs-Number of Steps: flight - pt has stair lift  Bathroom Shower/Tub: Multimedia programmer: Handicapped height Bathroom Accessibility: Yes Home Equipment: [employer] - built in, [employer] - tub/shower, Grab bars - toilet Additional Comments: Pt and spouse live with her daughter and son in [place].  They have an apartment in the basement of the house.  However, they have to walk around to the back of the house to access the door to their apartment.  Or, they can enter in the front door and has stair lift to basement apartment   Functional History: Prior Function Level of Independence: Independent Comments: Pt reports she was independent with ADLs. Occasionally sat  to shower, but often stands.  Does not use AD for ambulation.  Spouse is currently in SNF rehab  Functional Status:  Mobility: Bed Mobility Overal bed mobility: Needs Assistance Bed Mobility: Supine to Sit, Sit to Supine Supine to sit: Min assist, +2 for physical assistance Sit to  supine: Min assist General bed mobility comments: pt initiated assist with R UE, but needed truncal assist Transfers Overall transfer level: Needs assistance Equipment used: 2 person hand held assist Transfers: Sit to/from Stand Sit to Stand: Min assist, +2 physical assistance Stand pivot transfers: Min assist, +2 physical assistance General transfer comment: cues for hand placement; assist to come forward and power up. Ambulation/Gait Ambulation/Gait assistance: Min assist, +2 physical assistance Ambulation Distance (Feet): 3 Feet (forward and back with 2 person HHA) Assistive device: 2 person hand held assist Gait Pattern/deviations: Step-through pattern, Trunk flexed, Wide base of support General Gait Details:  unsteady, tentative steps    ADL: ADL Overall ADL's : Needs assistance/impaired Eating/Feeding: Minimal assistance, Moderate assistance, Sitting Eating/Feeding Details (indicate cue type and reason): min A to drink from cup.  Mod A to manage utensils  Grooming: Wash/dry hands, Wash/dry face, Minimal assistance, Sitting Upper Body Bathing: Maximal assistance, Sitting Lower Body Bathing: Maximal assistance, Sit to/from stand Upper Body Dressing : Maximal assistance, Sitting Lower Body Dressing: Total assistance, Sit to/from stand Toilet Transfer: Minimal assistance, Ambulation, BSC Toileting- Clothing Manipulation and Hygiene: Maximal assistance, Sit to/from stand Functional mobility during ADLs: +2 for physical assistance, Minimal assistance  Cognition: Cognition Overall Cognitive Status: Impaired/Different from baseline Arousal/Alertness: Awake/alert Orientation Level: Oriented X4 Attention: Sustained Sustained Attention: Appears intact Memory: Impaired Memory Impairment: Decreased recall of new information Awareness: Appears intact Problem Solving: Appears intact (with basic) Safety/Judgment: Appears intact Cognition Arousal/Alertness: Awake/alert Behavior  During Therapy: WFL for tasks assessed/performed Overall Cognitive Status: Impaired/Different from baseline Area of Impairment: Orientation, Attention, Memory, Following commands, Safety/judgement, Awareness, Problem solving Orientation Level: Disoriented to, Place, Time, Situation Current Attention Level: Sustained Memory: Decreased short-term memory Following Commands: Follows one step commands consistently Safety/Judgement: Decreased awareness of deficits Problem Solving: Slow processing, Decreased initiation, Difficulty sequencing, Requires verbal cues, Requires tactile cues General Comments: Pt thinks that is it nighttime, is unaware of why she is in hospital and which hospital she is in.  She is very slow to process information   Blood pressure 123/58, pulse 75, temperature 98.8 F (37.1 C), temperature source Oral, resp. rate 25, height 4\' 11"  (1.499 m), weight 54.885 kg (121 lb), SpO2 96 %. Physical Exam  Constitutional: She is oriented to person, place, and time. She appears well-developed.  HENT:  Head: Normocephalic.  Eyes: EOM are normal.  Neck: Normal range of motion. Neck supple. No thyromegaly present.  Cardiovascular: Normal rate and regular rhythm.   Respiratory: Effort normal and breath sounds normal. No respiratory distress.  GI: Soft. Bowel sounds are normal. She exhibits no distension.  Neurological: She is alert and oriented to person, place, and time.  Makes good eye contact with examiner. Follows simple commands.  Skin: Skin is warm and dry.  Patient follows simple commands during manual muscle testing however some repetition is needed. Question hard of hearing Motor strength 4/5 bilateral deltoid, biceps, triceps, grip 2 minus in the right knee extensor, 3 minus in the right hip flexor 4 minus and ankle dorsiflexor 3 minus in the left hip flexor 4 minus in the knee extensor and 4 minus in the ankle dorsiflexor Sensation difficult to assess secondary to her  tension  she needs repeated cueing. She was able to sense light touch on the toes of the right foot but did not appear to have intact sensation to light touch in the left foot. Upper extremity sensation appeared normal  Results for orders placed or performed during the hospital encounter of 03/19/15 (from the past 24 hour(s))  Troponin I (q 6hr x 3)     Status: Abnormal   Collection Time: 03/21/15 12:30 PM  Result Value Ref Range   Troponin I 0.09 (H) <0.031 ng/mL  Sodium     Status: None   Collection Time: 03/21/15 12:30 PM  Result Value Ref Range   Sodium 145 135 - 145 mmol/L  Sodium     Status: None   Collection Time: 03/21/15  9:50 PM  Result Value Ref Range   Sodium 143 135 - 145 mmol/L  CBC     Status: Abnormal   Collection Time: 03/22/15  4:00 AM  Result Value Ref Range   WBC 11.5 (H) 4.0 - 10.5 K/uL   RBC 3.69 (L) 3.87 - 5.11 MIL/uL   Hemoglobin 11.4 (L) 12.0 - 15.0 g/dL   HCT 34.2 (L) 36.0 - 46.0 %   MCV 92.7 78.0 - 100.0 fL   MCH 30.9 26.0 - 34.0 pg   MCHC 33.3 30.0 - 36.0 g/dL   RDW 13.1 11.5 - 15.5 %   Platelets 182 150 - 400 K/uL  Basic metabolic panel     Status: Abnormal   Collection Time: 03/22/15  4:00 AM  Result Value Ref Range   Sodium 142 135 - 145 mmol/L   Potassium 2.3 (LL) 3.5 - 5.1 mmol/L   Chloride 105 101 - 111 mmol/L   CO2 29 22 - 32 mmol/L   Glucose, Bld 148 (H) 65 - 99 mg/dL   BUN 12 6 - 20 mg/dL   Creatinine, Ser 0.80 0.44 - 1.00 mg/dL   Calcium 7.6 (L) 8.9 - 10.3 mg/dL   GFR calc non Af Amer >60 >60 mL/min   GFR calc Af Amer >60 >60 mL/min   Anion gap 8 5 - 15   Ct Head Wo Contrast  03/22/2015   CLINICAL DATA:  81 year old female with intracranial hemorrhage.  EXAM: CT HEAD WITHOUT CONTRAST  TECHNIQUE: Contiguous axial images were obtained from the base of the skull through the vertex without intravenous contrast.  COMPARISON:  Most recent prior head CT 03/20/2015; brain MRI 03/20/2015  FINDINGS: Right temporal lobe intraparenchymal hemorrhage  is again noted. Measuring along similar planes compared to the prior study demonstrates no significant interval change in the size of the hemorrhage. On image 8 of the hemorrhage measures 5.7 x 2.5 cm which is insignificantly changed compared to 5.9 x 2.3 cm previously. On image 9, the hemorrhage measures 5.1 x 2.6 cm compared to 5.0 x 2.6 cm. There is a similar degree of surrounding edema. Small volume intraventricular hemorrhage a layering within both occipital horns remains unchanged. Unchanged 6 mm of right to left midline shift. Small meningioma versus subdural hemorrhage on the right of the falx anteriorly. Similar effacement of the right perimesencephalic cisterns from a local mass effect. Stable remote right basal ganglia lacunar infarct. Stable ventricular configuration. Small right subdural hemorrhage overlying the right convexity is significantly less conspicuous on today's examination.  IMPRESSION: 1. Expected evolution of multifocal intracranial hemorrhage without significant interval progression. The small subdural hemorrhage overlying the right temporal lobe is significantly less conspicuous on today's exam, the intraparenchymal hemorrhage within the right temporal  lobe and small volume intraventricular hemorrhage layering within both occipital horns are essentially unchanged. 2. Small round high attenuation along the right aspect of the falx anteriorly may represent an additional focus of small subdural hemorrhage or a small meningioma. 3. Stable 6 mm right to left midline shift and effacement of the right perimesencephalic cisterns. 4. Stable ventricular configuration. No interval development of hydronephrosis.   Electronically Signed   By: Jacqulynn Cadet M.D.   On: 03/22/2015 07:31   Mr Jodene Nam Head Wo Contrast  03/20/2015   CLINICAL DATA:  Altered mental status. Left-sided hemiparesis. Dysarthria and slurred speech beginning yesterday. Intracranial hemorrhage.  EXAM: MRI HEAD WITHOUT CONTRAST   MRA HEAD WITHOUT CONTRAST  TECHNIQUE: Multiplanar, multiecho pulse sequences of the brain and surrounding structures were obtained without intravenous contrast. Angiographic images of the head were obtained using MRA technique without contrast.  COMPARISON:  CT head without contrast 03/20/2015  FINDINGS: MRI HEAD FINDINGS  A 6.2 x 2.6 x 4.0 cm hemorrhage within the right temporal lobe is not significantly changed. Mixed intensities correspond tube all vein blood products. There is no evidence for new hemorrhage. There is a fluid level within the more superior component of the hemorrhage. Surrounding vasogenic edema is evident. Midline shift of 8 mm 1 cut above the foramen of Monro is not significantly changed from the CT scan. There is partial effacement of the right lateral ventricle. The basal cisterns are intact.  Periventricular and subcortical T2 changes are evident bilaterally. T2 changes extend into the brainstem.  Flow is present in the major intracranial arteries. No underlying mass lesion is evident.  Bilateral lens replacements are noted. Mild mucosal thickening is present in the ethmoid air cells and frontal sinuses bilaterally. The mastoid air cells are clear.  There is some layering blood within the ventricles bilaterally. No associated hydrocephalus is present. A small subdural collection is again noted on the right with next blood products layering.  MRA HEAD FINDINGS  The internal carotid arteries demonstrate mild degenerative changes within the cavernous segments bilaterally. Prominent posterior communicating arteries are present. The right A1 segment is dominant. M1 segments are within normal limits bilaterally. The anterior communicating artery is patent. The MCA bifurcations are intact. There is moderate attenuation of MCA branch vessels bilaterally. A right-sided MCA branch vessels are displaced superiorly by the hemorrhage. Midline shift is evident.  The right vertebral artery is the dominant  vessel. The left vertebral artery essentially terminates at the PICA. Is very small distal left vertebral artery extends to the vertebrobasilar margin with marked stenosis. The right PICA origin is visualized and normal. The basilar artery is small terminating at the superior cerebellar arteries. Fetal type posterior cerebral arteries are present bilaterally with moderate PCA branch vessel narrowing.  IMPRESSION: 1. Stable size of large right temporal lobe parenchymal hemorrhage with layering blood products. 2. Similar appearance of diffuse vasogenic edema and 8 mm midline shift. 3. Stable small right extra-axial hemorrhage. 4. Blood products layering within the ventricles bilaterally without hydrocephalus. 5. Stable chronic small vessel T2 changes. 6. The MRA demonstrates displacement of the MCA branch vessels without a focal lesion to explain the hemorrhage. 7. Diffuse small vessel disease is present on the MRA.   Electronically Signed   By: San Morelle M.D.   On: 03/20/2015 13:21   Mr Brain Wo Contrast  03/20/2015   CLINICAL DATA:  Altered mental status. Left-sided hemiparesis. Dysarthria and slurred speech beginning yesterday. Intracranial hemorrhage.  EXAM: MRI HEAD WITHOUT CONTRAST  MRA HEAD WITHOUT CONTRAST  TECHNIQUE: Multiplanar, multiecho pulse sequences of the brain and surrounding structures were obtained without intravenous contrast. Angiographic images of the head were obtained using MRA technique without contrast.  COMPARISON:  CT head without contrast 03/20/2015  FINDINGS: MRI HEAD FINDINGS  A 6.2 x 2.6 x 4.0 cm hemorrhage within the right temporal lobe is not significantly changed. Mixed intensities correspond tube all vein blood products. There is no evidence for new hemorrhage. There is a fluid level within the more superior component of the hemorrhage. Surrounding vasogenic edema is evident. Midline shift of 8 mm 1 cut above the foramen of Monro is not significantly changed from the  CT scan. There is partial effacement of the right lateral ventricle. The basal cisterns are intact.  Periventricular and subcortical T2 changes are evident bilaterally. T2 changes extend into the brainstem.  Flow is present in the major intracranial arteries. No underlying mass lesion is evident.  Bilateral lens replacements are noted. Mild mucosal thickening is present in the ethmoid air cells and frontal sinuses bilaterally. The mastoid air cells are clear.  There is some layering blood within the ventricles bilaterally. No associated hydrocephalus is present. A small subdural collection is again noted on the right with next blood products layering.  MRA HEAD FINDINGS  The internal carotid arteries demonstrate mild degenerative changes within the cavernous segments bilaterally. Prominent posterior communicating arteries are present. The right A1 segment is dominant. M1 segments are within normal limits bilaterally. The anterior communicating artery is patent. The MCA bifurcations are intact. There is moderate attenuation of MCA branch vessels bilaterally. A right-sided MCA branch vessels are displaced superiorly by the hemorrhage. Midline shift is evident.  The right vertebral artery is the dominant vessel. The left vertebral artery essentially terminates at the PICA. Is very small distal left vertebral artery extends to the vertebrobasilar margin with marked stenosis. The right PICA origin is visualized and normal. The basilar artery is small terminating at the superior cerebellar arteries. Fetal type posterior cerebral arteries are present bilaterally with moderate PCA branch vessel narrowing.  IMPRESSION: 1. Stable size of large right temporal lobe parenchymal hemorrhage with layering blood products. 2. Similar appearance of diffuse vasogenic edema and 8 mm midline shift. 3. Stable small right extra-axial hemorrhage. 4. Blood products layering within the ventricles bilaterally without hydrocephalus. 5. Stable  chronic small vessel T2 changes. 6. The MRA demonstrates displacement of the MCA branch vessels without a focal lesion to explain the hemorrhage. 7. Diffuse small vessel disease is present on the MRA.   Electronically Signed   By: San Morelle M.D.   On: 03/20/2015 13:21   Dg Chest Port 1 View  03/20/2015   CLINICAL DATA:  Central line clotted, initial encounter.  EXAM: PORTABLE CHEST - 1 VIEW  COMPARISON:  None.  FINDINGS: The heart size and mediastinal contours are within normal limits. Both lungs are clear. No pneumothorax or pleural effusion is noted. Left internal jugular catheter is noted with distal tip in expected position of the SVC. The visualized skeletal structures are unremarkable.  IMPRESSION: Left internal jugular catheter is noted with distal tip in expected position the SVC. No acute cardiopulmonary abnormality seen.   Electronically Signed   By: Marijo Conception, M.D.   On: 03/20/2015 15:10    Assessment/Plan: Diagnosis: Right temporal intraparenchymal hemorrhage 1. Does the need for close, 24 hr/day medical supervision in concert with the patient's rehab needs make it unreasonable for this patient to be served in  a less intensive setting? Yes 2. Co-Morbidities requiring supervision/potential complications: Hypokalemia, history of hypertension 3. Due to bladder management, bowel management, safety, skin/wound care, disease management, medication administration, pain management and patient education, does the patient require 24 hr/day rehab nursing? Yes 4. Does the patient require coordinated care of a physician, rehab nurse, PT (1-2 hrs/day, 5 days/week), OT (1-2 hrs/day, 5 days/week) and SLP (0.5-1 hrs/day, 5 days/week) to address physical and functional deficits in the context of the above medical diagnosis(es)? Yes Addressing deficits in the following areas: balance, endurance, locomotion, strength, transferring, bowel/bladder control, bathing, dressing, feeding, grooming,  toileting, cognition, swallowing and psychosocial support 5. Can the patient actively participate in an intensive therapy program of at least 3 hrs of therapy per day at least 5 days per week? Potentially 6. The potential for patient to make measurable gains while on inpatient rehab is good 7. Anticipated functional outcomes upon discharge from inpatient rehab are supervision  with PT, supervision with OT, supervision with SLP. 8. Estimated rehab length of stay to reach the above functional goals is: 7-10 days 9. Does the patient have adequate social supports and living environment to accommodate these discharge functional goals? Potentially 10. Anticipated D/C setting: Home 11. Anticipated post D/C treatments: [facility] therapy 12. Overall Rehab/Functional Prognosis: good  RECOMMENDATIONS: This patient's condition is appropriate for continued rehabilitative care in the following setting: CIR once potassium is corrected and patient is able to tolerate  up in a chair at least 3 hours Patient has agreed to participate in recommended program. Yes Note that insurance prior authorization may be required for reimbursement for recommended care.  Comment: Potassium on 7/29 was 2.3    03/22/2015

## 2015-03-22 NOTE — Progress Notes (Signed)
Notified neuro Md about patient's HTN. Pt was asymptomatic clinically. New orders received. Will carry out orders and continue to monitor.

## 2015-03-22 NOTE — Progress Notes (Addendum)
CRITICAL VALUE ALERT  Critical value received:  K: 2.3  Date of notification:  03/22/2015  Time of notification:  0558  Critical value read back:Yes.    Nurse who received alert:  Eliane Decree, RN  MD notified (1st page):  Dr. Nicole Kindred  Time of first page:  0603  MD notified (2nd page):  Time of second page:  Responding MD:  Dr. Nicole Kindred  Time MD responded:  [phone] Order for potassium 10 meq IV x4.

## 2015-03-23 DIAGNOSIS — I619 Nontraumatic intracerebral hemorrhage, unspecified: Secondary | ICD-10-CM | POA: Insufficient documentation

## 2015-03-23 LAB — BASIC METABOLIC PANEL
Anion gap: 10 (ref 5–15)
Anion gap: 10 (ref 5–15)
BUN: 9 mg/dL (ref 6–20)
BUN: 18 mg/dL (ref 6–20)
CALCIUM: 8.1 mg/dL — AB (ref 8.9–10.3)
CHLORIDE: 94 mmol/L — AB (ref 101–111)
CO2: 29 mmol/L (ref 22–32)
CO2: 30 mmol/L (ref 22–32)
Calcium: 7.8 mg/dL — ABNORMAL LOW (ref 8.9–10.3)
Chloride: 95 mmol/L — ABNORMAL LOW (ref 101–111)
Creatinine, Ser: 0.75 mg/dL (ref 0.44–1.00)
Creatinine, Ser: 1.08 mg/dL — ABNORMAL HIGH (ref 0.44–1.00)
GFR calc non Af Amer: >60 mL/min (ref >60)
GFR, EST AFRICAN AMERICAN: 49 mL/min — AB (ref >60)
GFR, EST NON AFRICAN AMERICAN: 42 mL/min — AB (ref >60)
Glucose, Bld: 126 mg/dL — ABNORMAL HIGH (ref 65–99)
Glucose, Bld: 137 mg/dL — ABNORMAL HIGH (ref 65–99)
POTASSIUM: 3.6 mmol/L (ref 3.5–5.1)
Potassium: 2.5 mmol/L — CL (ref 3.5–5.1)
SODIUM: 134 mmol/L — AB (ref 135–145)
SODIUM: 134 mmol/L — AB (ref 135–145)

## 2015-03-23 LAB — SODIUM
Sodium: 135 mmol/L (ref 135–145)
Sodium: 133 mmol/L — ABNORMAL LOW (ref 135–145)
Sodium: 133 mmol/L — ABNORMAL LOW (ref 135–145)

## 2015-03-23 MED ORDER — POTASSIUM CHLORIDE 10 MEQ/50ML IV SOLN
10.0000 meq | INTRAVENOUS | Status: AC
Start: 2015-03-23 — End: 2015-03-23
  Administered 2015-03-23 (×5): 10 meq via INTRAVENOUS
  Filled 2015-03-23 (×5): qty 50

## 2015-03-23 MED ORDER — LABETALOL HCL 5 MG/ML IV SOLN
20.0000 mg | INTRAVENOUS | Status: DC | PRN
  Administered 2015-03-23: 20 mg via INTRAVENOUS
  Filled 2015-03-23: qty 4

## 2015-03-23 MED ORDER — HYDROCHLOROTHIAZIDE 25 MG PO TABS
25.0000 mg | ORAL_TABLET | Freq: Two times a day (BID) | ORAL | Status: DC
  Administered 2015-03-23 – 2015-03-24 (×4): 25 mg via ORAL
  Filled 2015-03-23 (×6): qty 1

## 2015-03-23 MED ORDER — POTASSIUM CHLORIDE CRYS ER 20 MEQ PO TBCR
20.0000 meq | EXTENDED_RELEASE_TABLET | Freq: Two times a day (BID) | ORAL | Status: AC
  Administered 2015-03-23 – 2015-03-26 (×8): 20 meq via ORAL
  Filled 2015-03-23 (×9): qty 1

## 2015-03-23 NOTE — Progress Notes (Signed)
STROKE TEAM PROGRESS NOTE   HISTORY [name] is an 81 y.o. female with a past medical history significant for HTN, osteopenia, brought to [facility] via EMS for evaluation of altered mental status, left hemiparesis, dysarthria. Daughter and son in law are at the bedside and stated that they last saw her normal last night (LKW 03/18/2015 at 2100) and this morning 03/19/2015 they found her sitting in the bathroom acting sleepy, poorly responsive and not speaking". They said that at baseline she is very active and functional and this was a dramatic change for her and thus EMS was summoned. When EMS arrived they report that patient was having slurred speech and not moving the left side. BP was apparently not significantly elevated initially and as a matter of fact BP 140/80 in the ED. No recent head trauma. Patient is not taking anticoagulants but has been on baby aspirin for the last couple of months. CT brain performed in the ED was personally reviewed and showed a large intraparenchymal hemorrhage of the right temporal lobe with a 6 mm right frontal temporal acute extra-axial hemorrhage with associated right to left midline shift. Neurosurgery consulted by ED attending and I was informed that patient was not considered a surgical candidate. Serologies: platelet count 182,000. INR 1.19. In the ED, [name] is drowsy but open her eyes and follows commands. Said that she had a HA the whole day yesterday but denies nausea, vomiting, vertigo, double vision, or language difficulty. She was admitted to the neuro ICU for further evaluation and treatment.   SUBJECTIVE (INTERVAL HISTORY) No event overnight.  Spoke with daughter and updated her regarding the plans   OBJECTIVE Temp:  [98 F (36.7 C)-100.6 F (38.1 C)] 98.9 F (37.2 C) (07/30 0347) Pulse Rate:  [56-163] 93 (07/30 0800) Cardiac Rhythm:  [-] Normal sinus rhythm;Heart block (07/30 0800) Resp:  [16-30] 23 (07/30 0800) BP: (123-205)/(57-116)  186/82 mmHg (07/30 0800) SpO2:  [95 %-100 %] 97 % (07/30 0800)  No results for input(s): GLUCAP in the last 168 hours.  Recent Labs Lab 03/19/15 1221 03/20/15 0418  03/22/15 0400 03/22/15 1015 03/22/15 1606 03/22/15 2200 03/23/15 0330  NA 135 133*  < > 142 141 136 136 134*  135  K 3.7 3.3*  --  2.3*  --   --   --  2.5*  CL 95* 96*  --  105  --   --   --  95*  CO2 28 25  --  29  --   --   --  29  GLUCOSE 147* 171*  --  148*  --   --   --  126*  BUN 16 12  --  12  --   --   --  9  CREATININE 0.87 0.80  --  0.80  --   --   --  0.75  CALCIUM 8.9 8.5*  --  7.6*  --   --   --  7.8*  < > = values in this interval not displayed.  Recent Labs Lab 03/19/15 1221  AST 24  ALT 27  ALKPHOS 50  BILITOT 1.4*  PROT 6.8  ALBUMIN 3.7    Recent Labs Lab 03/19/15 1221 03/22/15 0400  WBC 11.1* 11.5*  NEUTROABS 9.2*  --   HGB 13.0 11.4*  HCT 38.1 34.2*  MCV 91.4 92.7  PLT 182 182    Recent Labs Lab 03/20/15 0418  03/20/15 0825 03/20/15 1520 03/20/15 2120 03/21/15 0800 03/21/15 1230  CKTOTAL 66  --  67 88  --   --   --   CKMB 1.0  --  1.8 6.0*  --   --   --   TROPONINI  --   < > 0.04* 0.10* 0.06* 0.07* 0.09*  < > = values in this interval not displayed. No results for input(s): LABPROT, INR in the last 72 hours. No results for input(s): COLORURINE, LABSPEC, [place], GLUCOSEU, HGBUR, BILIRUBINUR, KETONESUR, PROTEINUR, UROBILINOGEN, NITRITE, LEUKOCYTESUR in the last 72 hours.  Invalid input(s): APPERANCEUR     Component Value Date/Time   CHOL 203* 04/24/2009 1113   TRIG 133.0 04/24/2009 1113   HDL 55.40 04/24/2009 1113   CHOLHDL 4 04/24/2009 1113   VLDL 26.6 04/24/2009 1113   No results found for: HGBA1C No results found for: LABOPIA, COCAINSCRNUR, LABBENZ, AMPHETMU, THCU, LABBARB  No results for input(s): ETH in the last 168 hours.  Ct Head Wo Contrast 03/20/2015    1. No significant interval change in size of right temporal lobe intraparenchymal hemorrhage with  slightly increased localized vasogenic edema. 2. No significant interval change in acute subdural hematoma overlying the right cerebral convexity measuring up to 5 mm in maximal thickness. Associated 6 mm of right-to-left shift is not significantly changed. 3. Small volume intraventricular hemorrhage, slightly increased from prior. Asymmetric dilatation of the left lateral ventricle is stable. No hydrocephalus. 4. No new intracranial process.     Ct Head Wo Contrast 03/19/2015   Large intraparenchymal hemorrhage of the right temporal lobe with a 6 mm right frontal temporal acute extra-axial hemorrhage. There is right to left midline shift.    Mr Brain Wo Contrast 03/20/2015   1. Stable size of large right temporal lobe parenchymal hemorrhage with layering blood products. 2. Similar appearance of diffuse vasogenic edema and 8 mm midline shift. 3. Stable small right extra-axial hemorrhage. 4. Blood products layering within the ventricles bilaterally without hydrocephalus. 5. Stable chronic small vessel T2 changes.   Mr Jodene Nam Head Wo Contrast 03/20/2015  6. The MRA demonstrates displacement of the MCA branch vessels without a focal lesion to explain the hemorrhage. 7. Diffuse small vessel disease is present on the MRA.     Dg Chest Port 1 View 03/20/2015    Left internal jugular catheter is noted with distal tip in expected position the SVC. No acute cardiopulmonary abnormality seen.     CT Head 03/22/2015 : Expected evolution of multifocal intracranial hemorrhage without significant interval progression. The small subdural hemorrhage overlying the right temporal lobe is significantly less conspicuous on today's exam, the intraparenchymal hemorrhage within the right temporal lobe and small volume intraventricular hemorrhage layering within both occipital horns are essentially unchanged.   PHYSICAL EXAM Frail elderly Caucasian lady not in distress. . Afebrile. Head is nontraumatic. Neck is supple without  bruit.     Cardiac exam no murmur or gallop.  Lungs are clear to auscultation.  Abdomen:  Soft non-tender, non-distended Distal pulses are well felt.  Neurological Exam :  Awake alert and interactive.  Speech is fluent without dysarthria or aphasia.  Diminished attention and recall. Follows commands well.  Fundi were not visualized. Pupils irregular but equal and reactive. Left partial homonymous hemianopsia noted. Mild left lower facial asymmetry. Tongue midline.   No upper or lower extremity drift but mild weakness of left grip and hip flexors with diminished fine finger movements on the left and orbits right over left upper extremity. Deep tendon reflexes are 1+ symmetric. Sensation is preserved bilaterally.Marland Kitchen  Left plantar is upgoing, right is downgoing. Gait cannot be tested   ASSESSMENT/PLAN [name] is a 81 y.o. female with history of hypertension presenting with altered mental status, left hemiparesis, dysarthria.   Stroke:  Non-dominant large right temporal intraparenchymal hemorrhage with IVH and cerebral edema secondary to either hypertension or cerebral amyloid angiopathy source (given lobar location and relatively normotensive state on arrival lean toward amyloid)  Neurosurgery consulted, not a surgical evacuation/decompression candidate (Nundkumar)  Resultant  Lethargic, left hemiparesis  Was started on mannitol in the ED with bolus followed by q6h dosing. No documented benefit to giving, d/c'd.  MRI  lg R temporal lobe hmg w/ edema and 8 mm midline shift  MRA  No large vessel occlusion, no source of hemorrhage  SCDs for VTE prophylaxis Diet regular Room service appropriate?: Yes; Fluid consistency:: Thin. ST assessed yest and recommended NPO. Will have them reassess today  aspirin 81 mg orally every day prior to admission  Ongoing aggressive stroke risk factor management. Patient at risk for neurologic worsening, increased hemorrhage.  Therapy  recommendations:  CLR. Ok to be OOB today and work with therapy  Continue ICU level care until off BP drip  Disposition:  CLR   Cytotoxic Cerebral Edema Induced Hypernatremia  [employer] place  Started on 3% 7/27  Na 145  Continue 3% drip, no adjustments given pt's improved mental status   Bradycardia w/ asystole  Transient HR 20s w/ asystole x 2 first night  HR stable overnight without further episodes  Elevated Troponins- mild likely of primary neurogenic etiology- patient denies chest pain  Check 2 D echo   Hypokalemia  K 2.5 on 03/23/2015  Replacement ordered for today - check bmet in a.m.   Malignant Hypertension  Home meds:   Cozaar, labetalol  Started on Cardene drip yesterday with Goal:  SBP < 160  BP has been less than goal since 0900 yest  Increase SBP goal to < 180  Start oral hypertensives once taking pos  Other Stroke Risk Factors  Advanced age  ETOH use    Induced Hypernatremia for cytotoxic edema- on 3% saline- sodium 142 today Leukocytosis  WBC 11.1  TM 102.8  UA and culture normal     CRITICAL NOTE ATTENDING NOTE: Patient was seen and examined by me personally. I reviewed notes, independently viewed imaging studies, participated in medical decision making and plan of care. I have made additions or clarifications directly to the above note.  Documentation accurately reflects findings. The laboratory and radiographic studies were personally reviewed by me.  ROS completed by me personally and pertinent positives fully documented. Assessment and plan completed by me personally and fully documented above.  Plans include:   Neuro  Patient tolerating 3% wean  3% to be discontinued by 10 AM  Cardiac  Check troponin in a.m.  Check 2-D echo  Hypertension - now back on home meds - add HCTZ  Pulmonary  No issues  GI  Eating 100% of meals  Last BM 03/21/2015  GU  Foley in place  Remove Foley when 3% IV is  discontinued  Endocrine  Glucose stable  Heme / IV  Mild anemia - appears stable  Mild leukocytosis - check CBC in a.m.  Condition is unchanged    This patient is critically ill and at significant risk of neurological worsening, death and care requires constant monitoring of vital signs, hemodynamics,respiratory and cardiac monitoring, extensive review of multiple databases, frequent neurological assessment, discussion with family,  other specialists and medical decision making of high complexity.  This critical care time does not reflect procedure time, or teaching time or supervisory time of PA/NP/Med Resident etc. but could involve care discussion time.  I spent 30 minutes of Neurocritical Care time in the care of  this patient.  SIGNED BY: Dr. Laddie Aquas day # 4  [name], [place] Munjor for Pager information 03/23/2015 8:21 AM        To contact Stroke Continuity provider, please refer to [email or web address] After hours, contact General Neurology

## 2015-03-23 NOTE — Progress Notes (Signed)
Paged Neurology to request PRN BP med to treat increasing BP. Also requested a repeat BMet d/t K of 2.3 this am, pt did receive replacement K today, but need to F/U on K status d/t to more frequent PVCs.   Dr. Nicole Kindred returned my page and placed orders accordingly.  Will continue to monitor and treat pt.

## 2015-03-23 NOTE — Progress Notes (Signed)
CRITICAL VALUE ALERT  Critical value received:  K 2.5  Date of notification:  03/23/2015  Time of notification:  0529  Critical value read back:Yes.    Nurse who received alert:  Tawni Carnes, RN  MD notified (1st page):  Dr. Nicole Kindred   Time of first page:  0630  MD notified (2nd page):  Time of second page:  Responding MD:  Dr. Nicole Kindred  Time MD responded:  [phone]

## 2015-03-24 ENCOUNTER — Inpatient Hospital Stay (HOSPITAL_COMMUNITY): Payer: Medicare Other

## 2015-03-24 DIAGNOSIS — R569 Unspecified convulsions: Secondary | ICD-10-CM | POA: Insufficient documentation

## 2015-03-24 DIAGNOSIS — Z8679 Personal history of other diseases of the circulatory system: Secondary | ICD-10-CM | POA: Insufficient documentation

## 2015-03-24 LAB — BASIC METABOLIC PANEL
Anion gap: 9 (ref 5–15)
BUN: 27 mg/dL — AB (ref 6–20)
CO2: 29 mmol/L (ref 22–32)
CREATININE: 0.98 mg/dL (ref 0.44–1.00)
Calcium: 8.1 mg/dL — ABNORMAL LOW (ref 8.9–10.3)
Chloride: 95 mmol/L — ABNORMAL LOW (ref 101–111)
GFR calc Af Amer: 55 mL/min — ABNORMAL LOW (ref >60)
GFR, EST NON AFRICAN AMERICAN: 48 mL/min — AB (ref >60)
GLUCOSE: 129 mg/dL — AB (ref 65–99)
Potassium: 3.6 mmol/L (ref 3.5–5.1)
Sodium: 133 mmol/L — ABNORMAL LOW (ref 135–145)

## 2015-03-24 LAB — CBC
HCT: 35.2 % — ABNORMAL LOW (ref 36.0–46.0)
Hemoglobin: 12 g/dL (ref 12.0–15.0)
MCH: 30.8 pg (ref 26.0–34.0)
MCHC: 34.1 g/dL (ref 30.0–36.0)
MCV: 90.5 fL (ref 78.0–100.0)
PLATELETS: 173 10*3/uL (ref 150–400)
RBC: 3.89 MIL/uL (ref 3.87–5.11)
RDW: 12.7 % (ref 11.5–15.5)
WBC: 12.2 10*3/uL — ABNORMAL HIGH (ref 4.0–10.5)

## 2015-03-24 LAB — SODIUM: SODIUM: 133 mmol/L — AB (ref 135–145)

## 2015-03-24 LAB — TROPONIN I: Troponin I: 0.04 ng/mL — ABNORMAL HIGH (ref <0.031)

## 2015-03-24 MED ORDER — LEVETIRACETAM 500 MG PO TABS
500.0000 mg | ORAL_TABLET | Freq: Two times a day (BID) | ORAL | Status: DC
Start: 2015-03-24 — End: 2015-03-29
  Administered 2015-03-24 – 2015-03-29 (×9): 500 mg via ORAL
  Filled 2015-03-24 (×12): qty 1

## 2015-03-24 MED ORDER — DICLOFENAC SODIUM 1 % TD GEL
2.0000 g | Freq: Four times a day (QID) | TRANSDERMAL | Status: DC
  Administered 2015-03-24 – 2015-03-29 (×20): 2 g via TOPICAL
  Filled 2015-03-24: qty 100

## 2015-03-24 MED ORDER — POLYETHYLENE GLYCOL 3350 17 G PO PACK
17.0000 g | PACK | Freq: Every day | ORAL | Status: DC | PRN
  Filled 2015-03-24: qty 1

## 2015-03-24 NOTE — Progress Notes (Addendum)
STROKE TEAM PROGRESS NOTE   HISTORY [name] is an 81 y.o. female with a past medical history significant for HTN, osteopenia, brought to [facility] via EMS for evaluation of altered mental status, left hemiparesis, dysarthria. Daughter and son in law were at the bedside and stated that they last saw her normal last night (LKW 03/18/2015 at 2100) and this morning 03/19/2015 they found her sitting in the bathroom acting sleepy, poorly responsive and not speaking". They said that at baseline she is very active and functional and this was a dramatic change for her and thus EMS was summoned. When EMS arrived they report that patient was having slurred speech and not moving the left side. BP was apparently not significantly elevated initially and as a matter of fact BP 140/80 in the ED. No recent head trauma. Patient is not taking anticoagulants but has been on baby aspirin for the last couple of months. CT brain performed in the ED was reviewed and showed a large intraparenchymal hemorrhage of the right temporal lobe with a 6 mm right frontal temporal acute extra-axial hemorrhage with associated right to left midline shift. Neurosurgery consulted by ED attending and I was informed that patient was not considered a surgical candidate. Serologies: platelet count 182,000. INR 1.19. In the ED, [name] was drowsy but opened her eyes and followed commands. Said that she had a HA the whole day yesterday but denies nausea, vomiting, vertigo, double vision, or language difficulty. She was admitted to the neuro ICU for further evaluation and treatment.   SUBJECTIVE (INTERVAL HISTORY) This morning patient had a sudden onset of unresponsiveness.  STAT head CT revealed:  Unchanged exam with right temporal intraparenchymal hemorrhage, 6 mm right to left midline shift, focal 4 mm right anterior parafalcine subdural hematoma and intraventricular hemorrhage again noted.  Daughter at bedside and CT scan and plan  discussed  OBJECTIVE Temp:  [96.6 F (35.9 C)-99.9 F (37.7 C)] 99 F (37.2 C) (07/31 0800) Pulse Rate:  [68-107] 83 (07/31 0700) Cardiac Rhythm:  [-] Atrial fibrillation (07/31 0400) Resp:  [11-31] 27 (07/31 0700) BP: (106-162)/(46-90) 146/73 mmHg (07/31 0700) SpO2:  [95 %-98 %] 95 % (07/31 0700)  No results for input(s): GLUCAP in the last 168 hours.  Recent Labs Lab 03/20/15 0418  03/22/15 0400  03/23/15 0330 03/23/15 1030 03/23/15 1605 03/23/15 2229 03/24/15 0442  NA 133*  < > 142  < > 134*  135 133* 134* 133* 133*  K 3.3*  --  2.3*  --  2.5*  --  3.6  --  3.6  CL 96*  --  105  --  95*  --  94*  --  95*  CO2 25  --  29  --  29  --  30  --  29  GLUCOSE 171*  --  148*  --  126*  --  137*  --  129*  BUN 12  --  12  --  9  --  18  --  27*  CREATININE 0.80  --  0.80  --  0.75  --  1.08*  --  0.98  CALCIUM 8.5*  --  7.6*  --  7.8*  --  8.1*  --  8.1*  < > = values in this interval not displayed.  Recent Labs Lab 03/19/15 1221  AST 24  ALT 27  ALKPHOS 50  BILITOT 1.4*  PROT 6.8  ALBUMIN 3.7    Recent Labs Lab 03/19/15 1221 03/22/15 0400  03/24/15 0442  WBC 11.1* 11.5* 12.2*  NEUTROABS 9.2*  --   --   HGB 13.0 11.4* 12.0  HCT 38.1 34.2* 35.2*  MCV 91.4 92.7 90.5  PLT 182 182 173    Recent Labs Lab 03/20/15 0418  03/20/15 0825 03/20/15 1520 03/20/15 2120 03/21/15 0800 03/21/15 1230 03/24/15 0442  CKTOTAL 66  --  67 88  --   --   --   --   CKMB 1.0  --  1.8 6.0*  --   --   --   --   TROPONINI  --   < > 0.04* 0.10* 0.06* 0.07* 0.09* 0.04*  < > = values in this interval not displayed. No results for input(s): LABPROT, INR in the last 72 hours. No results for input(s): COLORURINE, LABSPEC, [place], GLUCOSEU, HGBUR, BILIRUBINUR, KETONESUR, PROTEINUR, UROBILINOGEN, NITRITE, LEUKOCYTESUR in the last 72 hours.  Invalid input(s): APPERANCEUR     Component Value Date/Time   CHOL 203* 04/24/2009 1113   TRIG 133.0 04/24/2009 1113   HDL 55.40 04/24/2009  1113   CHOLHDL 4 04/24/2009 1113   VLDL 26.6 04/24/2009 1113   No results found for: HGBA1C No results found for: LABOPIA, COCAINSCRNUR, LABBENZ, AMPHETMU, THCU, LABBARB  No results for input(s): ETH in the last 168 hours.    Ct Head Wo Contrast 03/20/2015    1. No significant interval change in size of right temporal lobe intraparenchymal hemorrhage with slightly increased localized vasogenic edema. 2. No significant interval change in acute subdural hematoma overlying the right cerebral convexity measuring up to 5 mm in maximal thickness. Associated 6 mm of right-to-left shift is not significantly changed. 3. Small volume intraventricular hemorrhage, slightly increased from prior. Asymmetric dilatation of the left lateral ventricle is stable. No hydrocephalus. 4. No new intracranial process.     Ct Head Wo Contrast 03/19/2015   Large intraparenchymal hemorrhage of the right temporal lobe with a 6 mm right frontal temporal acute extra-axial hemorrhage. There is right to left midline shift.    Mr Brain Wo Contrast 03/20/2015   1. Stable size of large right temporal lobe parenchymal hemorrhage with layering blood products. 2. Similar appearance of diffuse vasogenic edema and 8 mm midline shift. 3. Stable small right extra-axial hemorrhage. 4. Blood products layering within the ventricles bilaterally without hydrocephalus. 5. Stable chronic small vessel T2 changes.   Mr Jodene Nam Head Wo Contrast 03/20/2015  6. The MRA demonstrates displacement of the MCA branch vessels without a focal lesion to explain the hemorrhage. 7. Diffuse small vessel disease is present on the MRA.     Dg Chest Port 1 View 03/20/2015    Left internal jugular catheter is noted with distal tip in expected position the SVC. No acute cardiopulmonary abnormality seen.     CT Head 03/22/2015 : Expected evolution of multifocal intracranial hemorrhage without significant interval progression. The small subdural hemorrhage overlying the  right temporal lobe is significantly less conspicuous on today's exam, the intraparenchymal hemorrhage within the right temporal lobe and small volume intraventricular hemorrhage layering within both occipital horns are essentially unchanged.  Ct Head Wo Contrast 03/24/2015 Unchanged exam with right temporal intraparenchymal hemorrhage, 6 mm right to left midline shift, focal 4 mm right anterior parafalcine subdural hematoma and intraventricular hemorrhage again noted.    PHYSICAL EXAM General:  Frail elderly Caucasian lady not in distress. Afebrile. Head is nontraumatic. Neck is supple without bruit.     Cardiac:  Afib, irreg. irreg exam no murmur or gallop.  Lungs: clear to auscultation.  Abdomen:  Soft non-tender, non-distended Extremities:  Distal pulses are well felt.  Neurological Exam :  Mental Status:  Awake alert and interactive; Speech is fluent without dysarthria or aphasia; Diminished attention and recall. Follows commands well. Oriented x3; earlier documented unresposive to sternal rub Cranial Nerves:  Pupils irregular but equal and reactive. Left partial homonymous hemianopsia noted. Mild left lower facial asymmetry. Tongue midline.   Motor:  No upper or lower extremity drift but mild weakness of left grip and hip flexors with diminished fine finger movements on the left and orbits right over left upper extremity.  Sensation: Preserved bilaterally.  Gait:  Not be tested Reflexes:  Deep tendon reflexes are 1+ symmetric. Left plantar is upgoing, right is downgoing   ASSESSMENT [name] is a 81 y.o. female with history of hypertension presenting with altered mental status, left hemiparesis, dysarthria.   Stroke:  Non-dominant large right temporal intraparenchymal hemorrhage with IVH and cerebral edema secondary to either hypertension or cerebral amyloid angiopathy source (given lobar location and relatively normotensive state on arrival lean toward  amyloid)  Neurosurgery consulted, not a surgical evacuation/decompression candidate (Nundkumar)  Resultant  Lethargic, left hemiparesis  Was started on mannitol in the ED with bolus followed by q6h dosing. No documented benefit to giving, d/c'd.  MRI  lg R temporal lobe hmg w/ edema and 8 mm midline shift  MRA  No large vessel occlusion, no source of hemorrhage  SCDs for VTE prophylaxis Diet regular Room service appropriate?: Yes; Fluid consistency:: Thin. ST assessed yest and recommended NPO. Will have them reassess today  aspirin 81 mg orally every day prior to admission  Ongoing aggressive stroke risk factor management. Patient at risk for neurologic worsening, increased hemorrhage.  Therapy recommendations:  CLR. Ok to be OOB today and work with therapy  Continue ICU level care until off BP drip  Disposition:  CLR  Cytotoxic Cerebral Edema Induced Hypernatremia  [employer] place  Started on 3% 7/27  Na 145  Continue 3% drip, no adjustments given pt's improved mental status  Bradycardia w/ asystole  Transient HR 20s w/ asystole x 2 first night  HR stable overnight without further episodes  Elevated Troponins- mild likely of primary neurogenic etiology- patient denies chest pain  Check 2 D echo  Hypokalemia  K 2.5 on 03/23/2015  Replacement ordered   Malignant Hypertension  Home meds:   Cozaar, labetalol  Started on Cardene drip yesterday with Goal:  SBP < 160  BP has been less than goal since 0900 yest  Increase SBP goal to < 180  Start oral hypertensives once taking pos  Other Stroke Risk Factors  Advanced age  ETOH use  Induced Hypernatremia for cytotoxic edema- on 3% saline- sodium 142 today Leukocytosis  WBC 11.1  TM 102.8  UA and culture normal    CRITICAL CARE ATTENDING NOTE: Patient was seen and examined by me personally. I reviewed notes, independently viewed imaging studies, participated in medical decision making and  plan of care.  Documentation accurately reflects findings. The laboratory and radiographic studies were personally reviewed by me.  ROS completed by me personally and pertinent positive only for right knee pain Assessment and plan completed by me personally and fully documented above.  Plans include:   Neuro  Patient tolerating now off of 3%  Will start Keppra and order continuous EEG  Cardiac  Checked troponin this AM; the numbers have varied and it is 0.04 today and  trending down; may be related to large [place]; if patient continues to have Cardiac irritability despite electrolyte balance may need to consult Cardiology  2-D echo results pending  Hypertension:  Less labile  Pulmonary  No issues  GI  Eating 100% of meals  Last BM 03/21/2015  Ordered Miralax for bowel protocol  GU  Voiding  Endocrine  Glucose stable  Hemoglobin A1C ordered today  Heme / IV  Mild anemia - appears stable  Mild leukocytosis and low grade fever for Tmax.  WBC:12.5 up from 11.5 check CBC in a.m.  Electrolytes:    Mild hyponatremia:  Chronic/stable   Condition worsened due to possible seizure.  This patient is critically ill and at significant risk of neurological worsening, death and care requires constant monitoring of vital signs, hemodynamics,respiratory and cardiac monitoring, extensive review of multiple databases, frequent neurological assessment, discussion with family, other specialists and medical decision making of high complexity.  This critical care time does not reflect procedure time, or teaching time or supervisory time of PA/NP/Med Resident etc. but could involve care discussion time.  I spent 30 minutes of Neurocritical Care time in the care of  this patient.  SIGNED BY: Dr. Laddie Aquas day # [place] for Pager information 03/24/2015 8:15 AM     To contact Stroke Continuity provider, please refer to  [email or web address] After hours, contact General Neurology

## 2015-03-25 ENCOUNTER — Inpatient Hospital Stay (HOSPITAL_COMMUNITY): Payer: Medicare Other

## 2015-03-25 DIAGNOSIS — R569 Unspecified convulsions: Secondary | ICD-10-CM

## 2015-03-25 DIAGNOSIS — I619 Nontraumatic intracerebral hemorrhage, unspecified: Secondary | ICD-10-CM

## 2015-03-25 DIAGNOSIS — D72829 Elevated white blood cell count, unspecified: Secondary | ICD-10-CM

## 2015-03-25 LAB — CBC WITH DIFFERENTIAL/PLATELET
Basophils Absolute: 0 10*3/uL (ref 0.0–0.1)
Basophils Relative: 0 % (ref 0–1)
Eosinophils Absolute: 0.1 10*3/uL (ref 0.0–0.7)
Eosinophils Relative: 1 % (ref 0–5)
HCT: 33.5 % — ABNORMAL LOW (ref 36.0–46.0)
HEMOGLOBIN: 11.6 g/dL — AB (ref 12.0–15.0)
LYMPHS ABS: 1.4 10*3/uL (ref 0.7–4.0)
Lymphocytes Relative: 9 % — ABNORMAL LOW (ref 12–46)
MCH: 31.4 pg (ref 26.0–34.0)
MCHC: 34.6 g/dL (ref 30.0–36.0)
MCV: 90.8 fL (ref 78.0–100.0)
MONO ABS: 1.4 10*3/uL — AB (ref 0.1–1.0)
Monocytes Relative: 9 % (ref 3–12)
Neutro Abs: 12.7 10*3/uL — ABNORMAL HIGH (ref 1.7–7.7)
Neutrophils Relative %: 82 % — ABNORMAL HIGH (ref 43–77)
Platelets: 196 10*3/uL (ref 150–400)
RBC: 3.69 MIL/uL — ABNORMAL LOW (ref 3.87–5.11)
RDW: 12.8 % (ref 11.5–15.5)
WBC: 15.5 10*3/uL — AB (ref 4.0–10.5)

## 2015-03-25 LAB — COMPREHENSIVE METABOLIC PANEL
ALBUMIN: 2.6 g/dL — AB (ref 3.5–5.0)
ALT: 79 U/L — ABNORMAL HIGH (ref 14–54)
AST: 29 U/L (ref 15–41)
Alkaline Phosphatase: 48 U/L (ref 38–126)
Anion gap: 8 (ref 5–15)
BILIRUBIN TOTAL: 1.2 mg/dL (ref 0.3–1.2)
BUN: 25 mg/dL — AB (ref 6–20)
CO2: 30 mmol/L (ref 22–32)
CREATININE: 0.87 mg/dL (ref 0.44–1.00)
Calcium: 8.4 mg/dL — ABNORMAL LOW (ref 8.9–10.3)
Chloride: 97 mmol/L — ABNORMAL LOW (ref 101–111)
GFR calc Af Amer: >60 mL/min (ref >60)
GFR, EST NON AFRICAN AMERICAN: 55 mL/min — AB (ref >60)
Glucose, Bld: 117 mg/dL — ABNORMAL HIGH (ref 65–99)
POTASSIUM: 3.7 mmol/L (ref 3.5–5.1)
Sodium: 135 mmol/L (ref 135–145)
TOTAL PROTEIN: 5.4 g/dL — AB (ref 6.5–8.1)

## 2015-03-25 LAB — LIPID PANEL
CHOLESTEROL: 132 mg/dL (ref 0–200)
HDL: 38 mg/dL — ABNORMAL LOW (ref >40)
LDL CALC: 75 mg/dL (ref 0–99)
TRIGLYCERIDES: 95 mg/dL (ref <150)
Total CHOL/HDL Ratio: 3.5 RATIO
VLDL: 19 mg/dL (ref 0–40)

## 2015-03-25 LAB — PHOSPHORUS: PHOSPHORUS: 4 mg/dL (ref 2.5–4.6)

## 2015-03-25 LAB — MAGNESIUM: Magnesium: 1.9 mg/dL (ref 1.7–2.4)

## 2015-03-25 MED ORDER — HEPARIN SODIUM (PORCINE) 5000 UNIT/ML IJ SOLN
5000.0000 [IU] | Freq: Two times a day (BID) | INTRAMUSCULAR | Status: DC
  Administered 2015-03-25 – 2015-03-29 (×9): 5000 [IU] via SUBCUTANEOUS
  Filled 2015-03-25 (×9): qty 1

## 2015-03-25 NOTE — Procedures (Signed)
History: 81 yo F with large R ICH  Sedation: None  Technique: This is a 19 channel routine scalp EEG performed at the bedside with bipolar and monopolar montages arranged in accordance to the international 10/20 system of electrode placement. One channel was dedicated to EKG recording.   Background: In addition to the PDR, there is generalized irregular delta activity. There is a posterior dominant rhythm of 8.5 Hz that attenuates with eye opening. Sleep is recorded with normal appearing structures. There are frequent right frontal sharp waves that occur at times periodically with a frequency of 1 Hz. These are associated with irregular delta activity in the same distribution, Fp2 > F4 > F8. Though these discharges have a triphasic morphology, there is a marked right predominance with none seen in the left.    Photic stimulation: Physiologic driving is not performed.   EEG Abnormalities:  1) Frequent right frontal sharp waves  Clinical Interpretation: This EEG is consistent with a region of potential epileptogenicity in the right frontal region). No seizure was recorded.    Roland Rack, MD [facility] [phone]  If 7pm- 7am, please page neurology on call as listed in [place].

## 2015-03-25 NOTE — Progress Notes (Signed)
Physical Therapy Treatment Patient Details Name: [name] MRN: [id number] DOB: [birth year] Today's Date: 03/25/2015    History of Present Illness [name] is a 81 year old female with a past medical history significant for HTN, osteopenia, who was brought to [facility] ED 03/18/15 via EMS for evaluation due to patient being found this AM in a sleepy state, poorly responsive and not speaking. Imaging revealed large right temporal lobe parenchymal hemorrhage, 8 mm midline shift and diffuse small vessel disease.7/31 with unresponsive episode ?seizure    PT Comments    Pt more lethargic this date (noted events of 7/31 with ?seizure). Required incr assist to attend to task and for transfer to chair. Will continue with current goals and reassess need to update/downgrade if lethargy persists.   Follow Up Recommendations  CIR     Equipment Recommendations   (TBA)    Recommendations for Other Services       Precautions / Restrictions Precautions Precautions: Fall Restrictions Weight Bearing Restrictions: No    Mobility  Bed Mobility Overal bed mobility: Needs Assistance Bed Mobility: Rolling;Sidelying to Sit Rolling: Mod assist Sidelying to sit: Mod assist       General bed mobility comments: roll to Lt with incr assist due to lethargy; once legs off EOB with assist, she initiated sitting up   Transfers Overall transfer level: Needs assistance Equipment used: 2 person hand held assist Transfers: Sit to/from Stand;Stand Pivot Transfers Sit to Stand: +2 physical assistance;Mod assist Stand pivot transfers: Mod assist;+2 physical assistance       General transfer comment: cues for hand placement; assist to come forward and power up (2 attempts prior to achieving standing); tactile cues to advance/step LLE towards chair  Ambulation/Gait             General Gait Details: unable due to lethargy   Stairs            Wheelchair Mobility    Modified  Rankin (Stroke Patients Only) Modified Rankin (Stroke Patients Only) Pre-Morbid Rankin Score: No symptoms Modified Rankin: Severe disability     Balance Overall balance assessment: Needs assistance Sitting-balance support: Bilateral upper extremity supported;Feet supported Sitting balance-Leahy Scale: Poor     Standing balance support: Bilateral upper extremity supported Standing balance-Leahy Scale: Poor                      Cognition Arousal/Alertness: Lethargic Behavior During Therapy: Flat affect Overall Cognitive Status: Impaired/Different from baseline Area of Impairment: Orientation;Attention;Memory;Following commands;Safety/judgement;Awareness;Problem solving Orientation Level: Disoriented to;Time Current Attention Level: Sustained Memory: Decreased short-term memory Following Commands: Follows one step commands inconsistently;Follows one step commands with increased time Safety/Judgement: Decreased awareness of deficits Awareness: Intellectual Problem Solving: Slow processing;Decreased initiation;Requires verbal cues;Requires tactile cues General Comments: more lethargic ?s/p seizure 7/31    Exercises      General Comments        Pertinent Vitals/Pain Pain Assessment: No/denies pain Faces Pain Scale: No hurt    Home Living                      Prior Function            PT Goals (current goals can now be found in the care plan section) Acute Rehab PT Goals Patient Stated Goal: To get better  Time For Goal Achievement: 04/04/15 (will monitor appropriateness or need to downgrade) Progress towards PT goals: Not progressing toward goals - comment (?seizure 7/31; lethargy)  Frequency  Min 3X/week    PT Plan Current plan remains appropriate    Co-evaluation             End of Session Equipment Utilized During Treatment: Gait belt Activity Tolerance: Patient limited by lethargy Patient left: in chair;with call bell/phone within  reach     Time: 7371-0626 PT Time Calculation (min) (ACUTE ONLY): 18 min  Charges:  $Therapeutic Activity: 8-22 mins                    G Codes:      Bettie Capistran 2015/04/14, 11:03 AM Pager [phone]

## 2015-03-25 NOTE — Progress Notes (Signed)
EEG completed, results pending. 

## 2015-03-25 NOTE — Progress Notes (Signed)
Neurology said to page black box about recommendation however black box does not follow med/surg patients. Black box recommending following up with cardiology and awaiting call back from them. Joaquin Bend E, RN 03/25/2015 11:59 PM w

## 2015-03-25 NOTE — Progress Notes (Signed)
Patient transferred from  ICU. Patient alert and oriented x 4. Patient made comfortable. Will continue to monitor.

## 2015-03-25 NOTE — Progress Notes (Signed)
Reviewed telemetry strip and saw patient was in aflutter. Currently asymptomatic and notified MD regarding condition. Will continue to monitor. Ruben Gottron, RN 03/25/2015 11:49 PM

## 2015-03-25 NOTE — Progress Notes (Addendum)
STROKE TEAM PROGRESS NOTE   HISTORY [name] is an 81 y.o. female with a past medical history significant for HTN, osteopenia, brought to [facility] via EMS for evaluation of altered mental status, left hemiparesis, dysarthria. Daughter and son in law were at the bedside and stated that they last saw her normal last night (LKW 03/18/2015 at 2100) and this morning 03/19/2015 they found her sitting in the bathroom acting sleepy, poorly responsive and not speaking". They said that at baseline she is very active and functional and this was a dramatic change for her and thus EMS was summoned. When EMS arrived they report that patient was having slurred speech and not moving the left side. BP was apparently not significantly elevated initially and as a matter of fact BP 140/80 in the ED. No recent head trauma. Patient is not taking anticoagulants but has been on baby aspirin for the last couple of months. CT brain performed in the ED was reviewed and showed a large intraparenchymal hemorrhage of the right temporal lobe with a 6 mm right frontal temporal acute extra-axial hemorrhage with associated right to left midline shift. Neurosurgery consulted by ED attending and I was informed that patient was not considered a surgical candidate. Serologies: platelet count 182,000. INR 1.19. In the ED, Mrs. Qualley was drowsy but opened her eyes and followed commands. Said that she had a HA the whole day yesterday but denies nausea, vomiting, vertigo, double vision, or language difficulty. She was admitted to the neuro ICU for further evaluation and treatment.   SUBJECTIVE (INTERVAL HISTORY) No family at bedside. No recurrent unresponsive episodes. Pt had two unresponsive episodes, first one likely due to asystole, and the second one is concerning for seizure. She was put on Keppra. EEG pending. BP low this morning, DC HCTZ, labetalol.  OBJECTIVE Temp:  [97.3 F (36.3 C)-100.1 F (37.8 C)] 98.3 F (36.8 C) (08/01  0748) Pulse Rate:  [71-93] 78 (08/01 1000) Cardiac Rhythm:  [-] Atrial fibrillation (08/01 0800) Resp:  [13-35] 28 (08/01 1000) BP: (88-121)/(32-68) 110/47 mmHg (08/01 1000) SpO2:  [94 %-99 %] 98 % (08/01 1000)  No results for input(s): GLUCAP in the last 168 hours.  Recent Labs Lab 03/22/15 0400  03/23/15 0330  03/23/15 1605 03/23/15 2229 03/24/15 0442 03/24/15 1025 03/25/15 0425  NA 142  < > 134*  135  < > 134* 133* 133* 133* 135  K 2.3*  --  2.5*  --  3.6  --  3.6  --  3.7  CL 105  --  95*  --  94*  --  95*  --  97*  CO2 29  --  29  --  30  --  29  --  30  GLUCOSE 148*  --  126*  --  137*  --  129*  --  117*  BUN 12  --  9  --  18  --  27*  --  25*  CREATININE 0.80  --  0.75  --  1.08*  --  0.98  --  0.87  CALCIUM 7.6*  --  7.8*  --  8.1*  --  8.1*  --  8.4*  MG  --   --   --   --   --   --   --   --  1.9  PHOS  --   --   --   --   --   --   --   --  4.0  < > =  values in this interval not displayed.  Recent Labs Lab 03/19/15 1221 03/25/15 0425  AST 24 29  ALT 27 79*  ALKPHOS 50 48  BILITOT 1.4* 1.2  PROT 6.8 5.4*  ALBUMIN 3.7 2.6*    Recent Labs Lab 03/19/15 1221 03/22/15 0400 03/24/15 0442 03/25/15 0425  WBC 11.1* 11.5* 12.2* 15.5*  NEUTROABS 9.2*  --   --  12.7*  HGB 13.0 11.4* 12.0 11.6*  HCT 38.1 34.2* 35.2* 33.5*  MCV 91.4 92.7 90.5 90.8  PLT 182 182 173 196    Recent Labs Lab 03/20/15 0418  03/20/15 0825 03/20/15 1520 03/20/15 2120 03/21/15 0800 03/21/15 1230 03/24/15 0442  CKTOTAL 66  --  67 88  --   --   --   --   CKMB 1.0  --  1.8 6.0*  --   --   --   --   TROPONINI  --   < > 0.04* 0.10* 0.06* 0.07* 0.09* 0.04*  < > = values in this interval not displayed. No results for input(s): LABPROT, INR in the last 72 hours. No results for input(s): COLORURINE, LABSPEC, [place], GLUCOSEU, HGBUR, BILIRUBINUR, KETONESUR, PROTEINUR, UROBILINOGEN, NITRITE, LEUKOCYTESUR in the last 72 hours.  Invalid input(s): APPERANCEUR     Component Value  Date/Time   CHOL 132 03/25/2015 0425   TRIG 95 03/25/2015 0425   HDL 38* 03/25/2015 0425   CHOLHDL 3.5 03/25/2015 0425   VLDL 19 03/25/2015 0425   LDLCALC 75 03/25/2015 0425   No results found for: HGBA1C No results found for: LABOPIA, COCAINSCRNUR, LABBENZ, AMPHETMU, THCU, LABBARB  No results for input(s): ETH in the last 168 hours.  I have personally reviewed the radiological images below and agree with the radiology interpretations.  Ct Head Wo Contrast 03/24/15 - Unchanged exam with right temporal intraparenchymal hemorrhage, 6 mm right to left midline shift, focal 4 mm right anterior parafalcine subdural hematoma and intraventricular hemorrhage again noted.  03/22/2015 : Expected evolution of multifocal intracranial hemorrhage without significant interval progression. The small subdural hemorrhage overlying the right temporal lobe is significantly less conspicuous on today's exam, the intraparenchymal hemorrhage within the right temporal lobe and small volume intraventricular hemorrhage layering within both occipital horns are essentially unchanged.  03/20/2015    1. No significant interval change in size of right temporal lobe intraparenchymal hemorrhage with slightly increased localized vasogenic edema. 2. No significant interval change in acute subdural hematoma overlying the right cerebral convexity measuring up to 5 mm in maximal thickness. Associated 6 mm of right-to-left shift is not significantly changed. 3. Small volume intraventricular hemorrhage, slightly increased from prior. Asymmetric dilatation of the left lateral ventricle is stable. No hydrocephalus. 4. No new intracranial process.     03/19/2015   Large intraparenchymal hemorrhage of the right temporal lobe with a 6 mm right frontal temporal acute extra-axial hemorrhage. There is right to left midline shift.    Mr Brain Wo Contrast 03/20/2015   1. Stable size of large right temporal lobe parenchymal hemorrhage with  layering blood products. 2. Similar appearance of diffuse vasogenic edema and 8 mm midline shift. 3. Stable small right extra-axial hemorrhage. 4. Blood products layering within the ventricles bilaterally without hydrocephalus. 5. Stable chronic small vessel T2 changes.   Mr Jodene Nam Head Wo Contrast 03/20/2015  6. The MRA demonstrates displacement of the MCA branch vessels without a focal lesion to explain the hemorrhage. 7. Diffuse small vessel disease is present on the MRA.     Dg Chest [facility]  1 View 03/20/2015    Left internal jugular catheter is noted with distal tip in expected position the SVC. No acute cardiopulmonary abnormality seen.    2D echo - pending  EEG -   1) Frequent right frontal sharp waves Clinical Interpretation: This EEG is consistent with a region of potential epileptogenicity in the right frontal region). No seizure was recorded.    PHYSICAL EXAM General:  Frail elderly Caucasian lady not in distress. Afebrile. Head is nontraumatic. Neck is supple without bruit.     Cardiac:  Afib, irreg. irreg exam, no murmur or gallop.  Lungs: clear to auscultation.  Abdomen:  Soft non-tender, non-distended Extremities:  Distal pulses are well felt.  Neurological Exam :  Mental Status:  Awake alert and interactive; Speech is fluent without dysarthria or aphasia; Diminished attention and recall. Follows commands well. Oriented x3; earlier documented unresposive to sternal rub Cranial Nerves:  Pupils irregular but equal and reactive. Left partial homonymous hemianopsia noted. Mild left lower facial asymmetry. Tongue midline.   Motor:  No upper or lower extremity drift but mild weakness of left grip and hip flexors with diminished fine finger movements on the left and orbits right over left upper extremity.  Sensation: Preserved bilaterally.  Gait:  Not be tested Reflexes:  Deep tendon reflexes are 1+ symmetric. Left plantar is upgoing, right is downgoing   ASSESSMENT Ms. TAKAKO MINCKLER is a 81 y.o. female with history of hypertension presenting with altered mental status, left hemiparesis, dysarthria.   Stroke:  Non-dominant large right temporal intraparenchymal hemorrhage with IVH and cerebral edema secondary to either hypertension or cerebral amyloid angiopathy source (given lobar location and relatively normotensive state on arrival lean toward amyloid)  Neurosurgery consulted, not a surgical evacuation/decompression candidate (Nundkumar)  Resultant  Lethargic, left mild hemiparesis  MRI  R temporal lobe hmg w/ edema and 8 mm midline shift  MRA  No large vessel occlusion, no source of hemorrhage  SCDs for VTE prophylaxis Diet regular Room service appropriate?: Yes; Fluid consistency:: Thin. ST assessed yest and recommended NPO. Will have them reassess today  aspirin 81 mg orally every day prior to admission  Ongoing aggressive stroke risk factor management. Patient at risk for neurologic worsening, increased hemorrhage.  Therapy recommendations:  CLR. Ok to be OOB today and work with therapy  Disposition:  CLR  Cytotoxic Cerebral Edema  Central Line placed - d/c today  Started on 3% 7/27 - but never reach Na therapeutic  Na 135 today  Discontinue 3% saline  Patient is 6 days out  Transfer to floor  Bradycardia w/ asystole  Transient HR 20s w/ asystole x 2 first night  HR stable overnight without further episodes  Elevated Troponins- mild likely of primary neurogenic etiology- patient denies chest pain  2D echo pending  Seizure - new diagnosis  EEG showed frequent right frontal sharps  Concerning second episode of unresponsiveness is due to seizure  Put on Keppra 500 mg twice a day  Repeat EEG in a couple days  Afib - new diagnosis  Showing up in tele  Confirmed on EKG  Not candidate for anticoagulation now.  Hypertension  Home meds:   Cozaar, labetalol  Started on Cardene drip yesterday with Goal:  SBP < 160  Start  oral hypertensives once taking pos  Blood pressure running low, DC labetalol and HCTZ  Continue cozaar  Close monitoring  Other Stroke Risk Factors  Advanced age  ETOH use  Leukocytosis  WBC 11.1->12.2->15.5  Afebrile  UA and culture 03/19/15 normal  Repeat UA and CXR  Hospital day # 6  [name], MD PhD Stroke Neurology 03/25/2015 7:40 PM    To contact Stroke Continuity provider, please refer to [email or web address] After hours, contact General Neurology

## 2015-03-26 ENCOUNTER — Inpatient Hospital Stay (HOSPITAL_COMMUNITY): Payer: Medicare Other

## 2015-03-26 DIAGNOSIS — N3000 Acute cystitis without hematuria: Secondary | ICD-10-CM

## 2015-03-26 DIAGNOSIS — I6789 Other cerebrovascular disease: Secondary | ICD-10-CM

## 2015-03-26 LAB — URINALYSIS W MICROSCOPIC (NOT AT ARMC)
BILIRUBIN URINE: NEGATIVE
Glucose, UA: NEGATIVE mg/dL
Ketones, ur: NEGATIVE mg/dL
Nitrite: POSITIVE — AB
Protein, ur: NEGATIVE mg/dL
Specific Gravity, Urine: 1.014 (ref 1.005–1.030)
Urobilinogen, UA: 0.2 mg/dL (ref 0.0–1.0)
pH: 5.5 (ref 5.0–8.0)

## 2015-03-26 LAB — BASIC METABOLIC PANEL
Anion gap: 11 (ref 5–15)
BUN: 26 mg/dL — AB (ref 6–20)
CO2: 26 mmol/L (ref 22–32)
Calcium: 8.5 mg/dL — ABNORMAL LOW (ref 8.9–10.3)
Chloride: 96 mmol/L — ABNORMAL LOW (ref 101–111)
Creatinine, Ser: 0.88 mg/dL (ref 0.44–1.00)
GFR calc Af Amer: >60 mL/min (ref >60)
GFR calc non Af Amer: 54 mL/min — ABNORMAL LOW (ref >60)
Glucose, Bld: 137 mg/dL — ABNORMAL HIGH (ref 65–99)
Potassium: 4.4 mmol/L (ref 3.5–5.1)
SODIUM: 133 mmol/L — AB (ref 135–145)

## 2015-03-26 LAB — CBC
HCT: 35.9 % — ABNORMAL LOW (ref 36.0–46.0)
HEMOGLOBIN: 12.5 g/dL (ref 12.0–15.0)
MCH: 31.3 pg (ref 26.0–34.0)
MCHC: 34.8 g/dL (ref 30.0–36.0)
MCV: 89.8 fL (ref 78.0–100.0)
Platelets: 239 10*3/uL (ref 150–400)
RBC: 4 MIL/uL (ref 3.87–5.11)
RDW: 13 % (ref 11.5–15.5)
WBC: 20 10*3/uL — ABNORMAL HIGH (ref 4.0–10.5)

## 2015-03-26 LAB — HEMOGLOBIN A1C
Hgb A1c MFr Bld: 5.8 % — ABNORMAL HIGH (ref 4.8–5.6)
MEAN PLASMA GLUCOSE: 120 mg/dL

## 2015-03-26 LAB — CLOSTRIDIUM DIFFICILE BY PCR: Toxigenic C. Difficile by PCR: NEGATIVE

## 2015-03-26 MED ORDER — DEXTROSE 5 % IV SOLN
1.0000 g | Freq: Every day | INTRAVENOUS | Status: DC
  Administered 2015-03-26 – 2015-03-28 (×3): 1 g via INTRAVENOUS
  Filled 2015-03-26 (×3): qty 10

## 2015-03-26 MED ORDER — CIPROFLOXACIN HCL 500 MG PO TABS
500.0000 mg | ORAL_TABLET | Freq: Two times a day (BID) | ORAL | Status: DC
  Filled 2015-03-26: qty 1

## 2015-03-26 MED ORDER — ENSURE ENLIVE PO LIQD: 237.0000 mL | Freq: Two times a day (BID) | ORAL | Status: DC | PRN

## 2015-03-26 NOTE — Progress Notes (Signed)
Initial Nutrition Assessment   INTERVENTION:  Provide Ensure Enlive po BID PRN, each supplement provides 350 kcal and 20 grams of protein Continue to monitor for PO adequacy   NUTRITION DIAGNOSIS:   Predicted suboptimal nutrient intake related to lethargy/confusion as evidenced by other (see comment) (pt's chart, staff report).   GOAL:   Patient will meet greater than or equal to 90% of their needs   MONITOR:   PO intake, Labs, Weight trends, Skin, I & O's, Supplement acceptance  REASON FOR ASSESSMENT:   Consult Poor PO  ASSESSMENT:   81 year old female with history of hypertension and osteopenia presents via EMS for evaluation of left hemiparesis, dysarthria, ICH.   RD consulted to assess pt due to Poor PO intake. Pt out of room upon first attempt to visit pt and asleep at second attempt. Per weight history, pt's weight has been stable at 120 lbs. Per nursing notes, pt is eating 75-100% of some meals; however, she only ate 25% of breakfast this morning and not all meals are documented. Per MD note, pt with increased lethargy this AM. Pt has food/snacks from home at bedside. Will continue to monitor for PO adequacy. Ensure PRN for now.   Labs: low sodium, low chloride, low calcium  Diet Order:  Diet regular Room service appropriate?: Yes; Fluid consistency:: Thin  Skin:  Reviewed, no issues  Last BM:  8/1  Height:   Ht Readings from Last 1 Encounters:  03/19/15 4\' 11"  (1.499 m)    Weight:   Wt Readings from Last 1 Encounters:  03/19/15 121 lb (54.885 kg)    Ideal Body Weight:  44.7 kg  BMI:  Body mass index is 24.43 kg/(m^2).  Estimated Nutritional Needs:   Kcal:  1300-1500  Protein:  65-75 grams  Fluid:  1.3-1.5 L/day  EDUCATION NEEDS:   No education needs identified at this time  Pryor Ochoa RD, LDN Inpatient Clinical Dietitian Pager: [phone] After Hours Pager: [phone]

## 2015-03-26 NOTE — Progress Notes (Signed)
Paged MD regarding 700 urine output from in/out for urinalysis and residual of 235. MD said to place foley and continue to monitor output. Joaquin Bend E, RN 03/26/2015 5:59 AM

## 2015-03-26 NOTE — Evaluation (Signed)
Physical Therapy Evaluation Patient Details Name: [name] MRN: [id number] DOB: December 22, 1917 Today's Date: 03/26/2015   History of Present Illness  [name] is a 81 year old female with a past medical history significant for HTN, osteopenia, who was brought to [facility] ED 03/18/15 via EMS for evaluation due to patient being found this AM in a sleepy state, poorly responsive and not speaking. Imaging revealed large right temporal lobe parenchymal hemorrhage, 8 mm midline shift and diffuse small vessel disease.7/31 with unresponsive episode ?seizure  Clinical Impression  Patient with increased lethargy, poor ability to respond to commands during session. Patient with no noted initiation and required increased physical assist for all aspects of mobility this session. Concerns regarding change in status discussed with Neuro MD. Will continue to see and progress as tolerated.    Follow Up Recommendations CIR    Equipment Recommendations   (TBA)    Recommendations for Other Services       Precautions / Restrictions Precautions Precautions: Fall Restrictions Weight Bearing Restrictions: No      Mobility  Bed Mobility Overal bed mobility: Needs Assistance Bed Mobility: Rolling;Supine to Sit;Sit to Supine Rolling: Max assist   Supine to sit: Max assist Sit to supine: Max assist   General bed mobility comments: patient with decreased ability to participate in bed mobility and repositioning. Patient without initiation of movement, very limited effort if any during transitional movements.  Transfers Overall transfer level: Needs assistance Equipment used: 2 person hand held assist Transfers: Sit to/from Stand Sit to Stand: +2 physical assistance;Mod assist Stand pivot transfers: Max assist;+2 physical assistance       General transfer comment: Max assist to elevate to standing, patient able to take some weight through LEs but unable to sustain any static standing  without maximal assist.  Ambulation/Gait             General Gait Details: unable due to lethargy  Stairs            Wheelchair Mobility    Modified Rankin (Stroke Patients Only) Modified Rankin (Stroke Patients Only) Pre-Morbid Rankin Score: No symptoms Modified Rankin: Severe disability     Balance     Sitting balance-Leahy Scale: Poor       Standing balance-Leahy Scale: Poor                               Pertinent Vitals/Pain Pain Assessment: No/denies pain Faces Pain Scale: No hurt    Home Living                        Prior Function                 Hand Dominance        Extremity/Trunk Assessment                         Communication      Cognition Arousal/Alertness: Lethargic Behavior During Therapy: Flat affect Overall Cognitive Status: Impaired/Different from baseline Area of Impairment: Orientation;Attention;Memory;Following commands;Safety/judgement;Awareness;Problem solving Orientation Level: Disoriented to;Time Current Attention Level: Sustained Memory: Decreased short-term memory Following Commands: Follows one step commands inconsistently;Follows one step commands with increased time Safety/Judgement: Decreased awareness of deficits Awareness: Intellectual Problem Solving: Slow processing;Decreased initiation;Requires verbal cues;Requires tactile cues General Comments: significantly lethargic, able to open eyes to stimulus breifly but unablt to sustain despite positional changes. Patient  also demonstrating some apraxia with functional behaviors and no initiation.     General Comments      Exercises        Assessment/Plan    PT Assessment    PT Diagnosis     PT Problem List    PT Treatment Interventions     PT Goals (Current goals can be found in the Care Plan section) Acute Rehab PT Goals Patient Stated Goal: To get better  PT Goal Formulation: With patient Time For Goal  Achievement: 04/04/15 Potential to Achieve Goals: Fair    Frequency Min 3X/week   Barriers to discharge        Co-evaluation               End of Session Equipment Utilized During Treatment: Gait belt Activity Tolerance: Patient limited by lethargy Patient left: in bed;with call bell/phone within reach;with bed alarm set;with family/visitor present Nurse Communication: Mobility status (significant lethargy noted, family concerned)         Time: 7824-2353 PT Time Calculation (min) (ACUTE ONLY): 19 min   Charges:     PT Treatments $Therapeutic Activity: 8-22 mins   PT G CodesDuncan Dull 04-07-2015, 1:36 PM Alben Deeds, [place] DPT  [phone]

## 2015-03-26 NOTE — Progress Notes (Signed)
EEG completed, results pending. 

## 2015-03-26 NOTE — Procedures (Signed)
History: 81 yo F with altered mentla status  Sedation: none  Technique: This is a 19 channel routine scalp EEG performed at the bedside with bipolar and monopolar montages arranged in accordance to the international 10/20 system of electrode placement. One channel was dedicated to EKG recording.    Background: The background consists of intermixed alpha and beta activities with some superimposed generalized irregular delta duringwakefullness. There is a posterior dominant rhythm of 8 hz. There are frequent runs of periodic bifrontal triphasic morphology discharges that occur in trains of 1 Hz. Sleep is recorded with a clear abatement of these waves. The waves have a bifrontal distribution with AP lag.   Photic stimulation: Physiologic driving is not performed  EEG Abnormalities: 1) Triphasic waves 2) Generalized slow activity.   Clinical Interpretation: This EEG is consistent with an encephalopathy. Triphasic waves are associai]ted with many kinds of toxic/metabolic encephalopathies including infection, hepatic failure, renal failure, medication effect among other causes.   Roland Rack, MD [facility] [phone]  If 7pm- 7am, please page neurology on call as listed in [place].

## 2015-03-26 NOTE — Progress Notes (Signed)
Patient appears lethargic, able to open eyes with stimuli. She was able to eat 25 percent of meal this AM sitting upright in bed with continuous stimulation to stay awake. No coughing noted or signs of aspiration noted. MD aware of changes this AM and at bedside with family. Will continue to monitor.   Ave Filter, RN

## 2015-03-26 NOTE — Progress Notes (Signed)
Rehab admissions - I spoke with patient's daughter, [name], by phone today.  I explained inpatient rehab and copays of $345 days 1-5 and I explained SNF benefits.  Daughter would like for patient to stay in hospital to get her rehab.  However, husband is at [facility] and daughter is talking to husband this am about rehab here in hospital versus rehab in a SNF.  I should here back from insurance carrier today about possible inpatient rehab admission.  Call me for questions.  #[phone]

## 2015-03-26 NOTE — Progress Notes (Signed)
STROKE TEAM PROGRESS NOTE   HISTORY [name] is an 81 y.o. female with a past medical history significant for HTN, osteopenia, brought to [facility] via EMS for evaluation of altered mental status, left hemiparesis, dysarthria. Daughter and son in law were at the bedside and stated that they last saw her normal last night (LKW 03/18/2015 at 2100) and this morning 03/19/2015 they found her sitting in the bathroom acting sleepy, poorly responsive and not speaking". They said that at baseline she is very active and functional and this was a dramatic change for her and thus EMS was summoned. When EMS arrived they report that patient was having slurred speech and not moving the left side. BP was apparently not significantly elevated initially and as a matter of fact BP 140/80 in the ED. No recent head trauma. Patient is not taking anticoagulants but has been on baby aspirin for the last couple of months. CT brain performed in the ED was reviewed and showed a large intraparenchymal hemorrhage of the right temporal lobe with a 6 mm right frontal temporal acute extra-axial hemorrhage with associated right to left midline shift. Neurosurgery consulted by ED attending and I was informed that patient was not considered a surgical candidate. Serologies: platelet count 182,000. INR 1.19. In the ED, [name] was drowsy but opened her eyes and followed commands. Said that she had a HA the whole day yesterday but denies nausea, vomiting, vertigo, double vision, or language difficulty. She was admitted to the neuro ICU for further evaluation and treatment.   SUBJECTIVE (INTERVAL HISTORY) Patient's son is at the bedside. RN and therapy staff report worsening mental status this am - difficult to arouse, no seizure activity. UA showed UTI.   OBJECTIVE Temp:  [96.8 F (36 C)-99.6 F (37.6 C)] 97.9 F (36.6 C) (08/02 0513) Pulse Rate:  [67-94] 89 (08/02 0513) Cardiac Rhythm:  [-] Atrial flutter (08/01 2113) Resp:   [13-28] 18 (08/02 0513) BP: (107-141)/(47-70) 123/59 mmHg (08/02 0513) SpO2:  [97 %-99 %] 97 % (08/02 0513)  No results for input(s): GLUCAP in the last 168 hours.  Recent Labs Lab 03/23/15 0330  03/23/15 1605 03/23/15 2229 03/24/15 0442 03/24/15 1025 03/25/15 0425 03/26/15 0604  NA 134*  135  < > 134* 133* 133* 133* 135 133*  K 2.5*  --  3.6  --  3.6  --  3.7 4.4  CL 95*  --  94*  --  95*  --  97* 96*  CO2 29  --  30  --  29  --  30 26  GLUCOSE 126*  --  137*  --  129*  --  117* 137*  BUN 9  --  18  --  27*  --  25* 26*  CREATININE 0.75  --  1.08*  --  0.98  --  0.87 0.88  CALCIUM 7.8*  --  8.1*  --  8.1*  --  8.4* 8.5*  MG  --   --   --   --   --   --  1.9  --   PHOS  --   --   --   --   --   --  4.0  --   < > = values in this interval not displayed.  Recent Labs Lab 03/19/15 1221 03/25/15 0425  AST 24 29  ALT 27 79*  ALKPHOS 50 48  BILITOT 1.4* 1.2  PROT 6.8 5.4*  ALBUMIN 3.7 2.6*    Recent  Labs Lab 03/19/15 1221 03/22/15 0400 03/24/15 0442 03/25/15 0425 03/26/15 0604  WBC 11.1* 11.5* 12.2* 15.5* 20.0*  NEUTROABS 9.2*  --   --  12.7*  --   HGB 13.0 11.4* 12.0 11.6* 12.5  HCT 38.1 34.2* 35.2* 33.5* 35.9*  MCV 91.4 92.7 90.5 90.8 89.8  PLT 182 182 173 196 239    Recent Labs Lab 03/20/15 0418  03/20/15 0825 03/20/15 1520 03/20/15 2120 03/21/15 0800 03/21/15 1230 03/24/15 0442  CKTOTAL 66  --  67 88  --   --   --   --   CKMB 1.0  --  1.8 6.0*  --   --   --   --   TROPONINI  --   < > 0.04* 0.10* 0.06* 0.07* 0.09* 0.04*  < > = values in this interval not displayed. No results for input(s): LABPROT, INR in the last 72 hours.  Recent Labs  03/26/15 0535  COLORURINE YELLOW  LABSPEC 1.014  PHURINE 5.5  GLUCOSEU NEGATIVE  HGBUR LARGE*  BILIRUBINUR NEGATIVE  KETONESUR NEGATIVE  PROTEINUR NEGATIVE  UROBILINOGEN 0.2  NITRITE POSITIVE*  LEUKOCYTESUR LARGE*       Component Value Date/Time   CHOL 132 03/25/2015 0425   TRIG 95 03/25/2015 0425    HDL 38* 03/25/2015 0425   CHOLHDL 3.5 03/25/2015 0425   VLDL 19 03/25/2015 0425   LDLCALC 75 03/25/2015 0425   Lab Results  Component Value Date   HGBA1C 5.8* 03/25/2015   No results found for: LABOPIA, COCAINSCRNUR, LABBENZ, AMPHETMU, THCU, LABBARB  No results for input(s): ETH in the last 168 hours.   Ct Head Wo Contrast 03/24/15 - Unchanged exam with right temporal intraparenchymal hemorrhage, 6 mm right to left midline shift, focal 4 mm right anterior parafalcine subdural hematoma and intraventricular hemorrhage again noted.  03/22/2015 : Expected evolution of multifocal intracranial hemorrhage without significant interval progression. The small subdural hemorrhage overlying the right temporal lobe is significantly less conspicuous on today's exam, the intraparenchymal hemorrhage within the right temporal lobe and small volume intraventricular hemorrhage layering within both occipital horns are essentially unchanged.  03/20/2015    1. No significant interval change in size of right temporal lobe intraparenchymal hemorrhage with slightly increased localized vasogenic edema. 2. No significant interval change in acute subdural hematoma overlying the right cerebral convexity measuring up to 5 mm in maximal thickness. Associated 6 mm of right-to-left shift is not significantly changed. 3. Small volume intraventricular hemorrhage, slightly increased from prior. Asymmetric dilatation of the left lateral ventricle is stable. No hydrocephalus. 4. No new intracranial process.     03/19/2015   Large intraparenchymal hemorrhage of the right temporal lobe with a 6 mm right frontal temporal acute extra-axial hemorrhage. There is right to left midline shift.    Mr Brain Wo Contrast 03/20/2015   1. Stable size of large right temporal lobe parenchymal hemorrhage with layering blood products. 2. Similar appearance of diffuse vasogenic edema and 8 mm midline shift. 3. Stable small right extra-axial  hemorrhage. 4. Blood products layering within the ventricles bilaterally without hydrocephalus. 5. Stable chronic small vessel T2 changes.   Mr Jodene Nam Head Wo Contrast 03/20/2015  6. The MRA demonstrates displacement of the MCA branch vessels without a focal lesion to explain the hemorrhage. 7. Diffuse small vessel disease is present on the MRA.     Dg Chest Port 1 View 03/20/2015    Left internal jugular catheter is noted with distal tip in expected position the SVC. No  acute cardiopulmonary abnormality seen.    2D echo - Left ventricle: The cavity size was normal. Wall thickness was increased in a pattern of mild LVH. There was focal basal hypertrophy. Systolic function was vigorous. The estimated ejection fraction was in the range of 65% to 70%. Wall motion was normal; there were no regional wall motion abnormalities. - Aortic valve: Moderately calcified annulus. - Mitral valve: Severely calcified annulus. There was mild regurgitation. Valve area by continuity equation (using LVOT flow): 1.47 cm^2. - Left atrium: The atrium was moderately dilated.  EEG  03/25/15 -   1) Frequent right frontal sharp waves Clinical Interpretation: This EEG is consistent with a region of potential epileptogenicity in the right frontal region). No seizure was recorded.  03/26/15  1) Triphasic waves 2) Generalized slow activity.  Clinical Interpretation: This EEG is consistent with an encephalopathy. Triphasic waves are associai]ted with many kinds of toxic/metabolic encephalopathies including infection, hepatic failure, renal failure, medication effect among other causes.   PHYSICAL EXAM  Temp:  [97.2 F (36.2 C)-98.3 F (36.8 C)] 97.7 F (36.5 C) (08/02 1706) Pulse Rate:  [88-96] 96 (08/02 1706) Resp:  [16-20] 16 (08/02 1706) BP: (123-141)/(53-63) 130/60 mmHg (08/02 1706) SpO2:  [97 %-100 %] 98 % (08/02 1706)  General - Well nourished, well developed, lethargic, drowsy.  Ophthalmologic -  Fundi not visualized due to noncooperation.  Cardiovascular - irregularly irregular heart rate and rhythm.  Neuro - patient drowsy, sleepy, however easily arousable. Able to follow commands with encouragement. Answer questions with short sentences. Noncooperation on other language testing. Blinking to visual threat bilaterally, PERRL, facial symmetrical, tongue in middle, no upper or lower extremity drift but mild weakness of left grip and hip flexors with diminished fine finger movements on the left. Sensation symmetrical, DTR 1+, no Babinski. Coordination intact bilaterally. Gait not tested.   ASSESSMENT [name] is a 81 y.o. female with history of hypertension presenting with altered mental status, left hemiparesis, dysarthria.   Encephalopathy  Increased lethargy this am  Etiology unclear, most likely UTI reaction vs possible seizure vs reaction to keppra   UA positive for UTI; CXR neg  Will start rocephin   EEG showed triphasic waves, consistent with encephalopathy  Stroke: large right temporal intraparenchymal hemorrhage with IVH and cerebral edema secondary to either hypertension or cerebral amyloid angiopathy source (given lobar location and relatively normotensive state on arrival lean toward amyloid)  Resultant  Left mild hemiparesis  MRI  R temporal lobe hmg w/ edema and 8 mm midline shift  MRA  No large vessel occlusion, no source of hemorrhage  Heparin subcutaneous for VTE prophylaxis Diet regular Room service appropriate?: Yes; Fluid consistency:: Thin.   aspirin 81 mg orally every day prior to admission, no antithrombotics due to [place].  Ongoing aggressive stroke risk factor management.   Therapy recommendations:  CIR.   Disposition:  CIR  Bradycardia w/ asystole  Transient HR 20s w/ asystole x 2 first night  HR stable overnight without further episodes  Elevated Troponins- mild likely of primary neurogenic etiology- patient denies chest  pain  2D echo EF 65-70%   ? Seizure   EEG showed frequent right frontal sharps  Concerning second episode of unresponsiveness is due to seizure  Put on Keppra 500 mg twice a day  Repeat EEG showed bilateral triphasic waves consistent with encephalopathy  Afib - new diagnosis  Showing up in tele  Confirmed on EKG  Not candidate for anticoagulation now.  Hypertension  Home  meds:   Cozaar, labetalol  labetalol and HCTZ discontinued  Continue cozaar  Close monitoring  BP much stable at goal  Other Stroke Risk Factors  Advanced age  ETOH use  Urinary Tract Infection, hospital acquired with Urinary Rentention  WBC 11.1->12.2->15.5 ->20.0  Afebrile  UA w/ bacteria TNTC. culture pending  Foley put in yesterday d/t 350cc retention   Hospital day # [place] for Pager information 03/26/2015 10:31 AM   I, the attending vascular neurologist, have personally obtained a history, examined the patient, evaluated laboratory data, individually viewed imaging studies and agree with radiology interpretations. Together with the NP/PA, we formulated the assessment and plan of care which reflects our mutual decision.  I have made any additions or clarifications directly to the above note and agree with the findings and plan as currently documented.   81 year old female with history of hypertension admitted for right temporal ICH. Off 3% saline and transfer to floor. Hematoma was stable on repeat CT. However, developed lethargy, high WBC, repeat UA showed UTI, put on antibodies. EEG showed triphasic waves, consistent with encephalopathy. Yesterday EEG concerning for right hemisphere sharp waves, put on Keppra. Patient found to have A. fib, not a candidate for anticoagulation at this moment.   [name], MD PhD Stroke Neurology 03/26/2015 6:18 PM       To contact Stroke Continuity provider, please refer to [email or web address] After hours,  contact General Neurology

## 2015-03-26 NOTE — Progress Notes (Signed)
  Echocardiogram 2D Echocardiogram has been performed.  Darlina Sicilian M 03/26/2015, 2:58 PM

## 2015-03-26 NOTE — Progress Notes (Addendum)
Neurology called and said they would initiate the cardiac consult. Patient is still rate controlled and Asymptomatic in aflutter. Will continue to monitor. Joaquin Bend E, RN 03/26/2015 12:15 AM

## 2015-03-26 NOTE — Progress Notes (Signed)
OT Cancellation Note  Patient Details Name: MADALENA KESECKER MRN: [id number] DOB: 1917-11-06   Cancelled Treatment:    Reason Eval/Treat Not Completed: Patient at procedure or test/ unavailable. Pt having ultrasound procedure. OT to reattempt as schedule permits.  Hortencia Pilar 03/26/2015, 2:40 PM

## 2015-03-26 NOTE — Care Management Important Message (Signed)
Important Message  Patient Details  Name: [name] MRN: [id number] Date of Birth: 10-Nov-1917   Medicare Important Message Given:  Yes-second notification given    Rolm Baptise, RN 03/26/2015, 3:21 PMImportant Message  Patient Details  Name: [name] MRN: [id number] Date of Birth: 03-26-1918   Medicare Important Message Given:  Yes-second notification given    Rolm Baptise, RN 03/26/2015, 3:21 PM

## 2015-03-26 NOTE — Progress Notes (Signed)
Rehab admissions - I have approval for acute inpatient rehab admission from insurance carrier.  Daughter and husband are in agreement to acute inpatient rehab admission.  Patient is not medically ready today for rehab.  Will follow up tomorrow.  Call me for questions.  #[phone]

## 2015-03-26 NOTE — Progress Notes (Signed)
Speech Language Pathology Treatment: Cognitive-Linquistic Patient Details Name: [name] MRN: [id number] DOB: July 21, 1918 Today's Date: 03/26/2015 Time: 1010-1018 SLP Time Calculation (min) (ACUTE ONLY): 8 min  Assessment / Plan / Recommendation Clinical Impression  Skilled treatment session focused on addressing cognition and dysphagia goals.  Patient was able to arouse to verbal and tactile stimulation and verbalize greetings; however, she was only able to maintain this arousal for brief periods.  SLP facilitated session with Max hand-over-hand assist for initiation of and sustained attention to basic self-care task.  Patient demonstrated ability to initiate face washing with right upper extremity and complete task for 2-3 seconds.    PO goals not addressed this session due to level of arousal.  SLP educated RN regarding aspiration risk of eating and drinking when not awake and aroused.  Recommend holding PO until arousal resumes.  Also consider set up of bedside suctioning for oral care.      HPI Other Pertinent Information: [name] is a 81 year old female with a past medical history significant for HTN, osteopenia, who was brought to [facility] ED 03/18/15 via EMS for evaluation due to patient being found this AM in a sleepy state, poorly responsive and not speaking. Imaging revealed large right temporal lobe parenchymal hemorrhage, 8 mm midline shift and diffuse small vessel disease.  Patient initially passed RN stroke swallow screen; however, due to [place] orders received for bedside swallow and speech-language evaluation.     Pertinent Vitals Pain Assessment: No/denies pain  SLP Plan  Continue with current plan of care, recommend holding PO until awake and alert                    Oral Care Recommendations: Oral care BID Follow up Recommendations: Inpatient Rehab;24 hour supervision/assistance Plan: Continue with current plan of care    GO    Carmelia Roller.,  CCC-SLP [phone]  [place] 03/26/2015, 10:29 AM

## 2015-03-27 ENCOUNTER — Inpatient Hospital Stay (HOSPITAL_COMMUNITY): Payer: Medicare Other

## 2015-03-27 DIAGNOSIS — G935 Compression of brain: Secondary | ICD-10-CM

## 2015-03-27 LAB — BASIC METABOLIC PANEL
ANION GAP: 7 (ref 5–15)
BUN: 23 mg/dL — ABNORMAL HIGH (ref 6–20)
CO2: 29 mmol/L (ref 22–32)
CREATININE: 0.93 mg/dL (ref 0.44–1.00)
Calcium: 8.6 mg/dL — ABNORMAL LOW (ref 8.9–10.3)
Chloride: 104 mmol/L (ref 101–111)
GFR calc Af Amer: 59 mL/min — ABNORMAL LOW (ref >60)
GFR calc non Af Amer: 51 mL/min — ABNORMAL LOW (ref >60)
Glucose, Bld: 136 mg/dL — ABNORMAL HIGH (ref 65–99)
Potassium: 4.2 mmol/L (ref 3.5–5.1)
Sodium: 140 mmol/L (ref 135–145)

## 2015-03-27 LAB — CBC
HCT: 33 % — ABNORMAL LOW (ref 36.0–46.0)
Hemoglobin: 11.1 g/dL — ABNORMAL LOW (ref 12.0–15.0)
MCH: 30.6 pg (ref 26.0–34.0)
MCHC: 33.6 g/dL (ref 30.0–36.0)
MCV: 90.9 fL (ref 78.0–100.0)
Platelets: 279 10*3/uL (ref 150–400)
RBC: 3.63 MIL/uL — AB (ref 3.87–5.11)
RDW: 13.1 % (ref 11.5–15.5)
WBC: 15.1 10*3/uL — AB (ref 4.0–10.5)

## 2015-03-27 MED ORDER — SODIUM CHLORIDE 0.9 % IV SOLN
INTRAVENOUS | Status: DC
  Administered 2015-03-27 – 2015-03-28 (×2): via INTRAVENOUS

## 2015-03-27 MED FILL — Perflutren Lipid Microsphere IV Susp 1.1 MG/ML: INTRAVENOUS | Qty: 10 | Status: AC

## 2015-03-27 NOTE — Progress Notes (Signed)
STROKE TEAM PROGRESS NOTE   HISTORY [name] is an 81 y.o. female with a past medical history significant for HTN, osteopenia, brought to [facility] via EMS for evaluation of altered mental status, left hemiparesis, dysarthria. Daughter and son in law were at the bedside and stated that they last saw her normal last night (LKW 03/18/2015 at 2100) and this morning 03/19/2015 they found her sitting in the bathroom acting sleepy, poorly responsive and not speaking". They said that at baseline she is very active and functional and this was a dramatic change for her and thus EMS was summoned. When EMS arrived they report that [name] was having slurred speech and not moving the left side. BP was apparently not significantly elevated initially and as a matter of fact BP 140/80 in the ED. No recent head trauma. [name] is not taking anticoagulants but has been on baby aspirin for the last couple of months. CT brain performed in the ED was reviewed and showed a large intraparenchymal hemorrhage of the right temporal lobe with a 6 mm right frontal temporal acute extra-axial hemorrhage with associated right to left midline shift. Neurosurgery consulted by ED attending and I was informed that [name] was not considered a surgical candidate. Serologies: platelet count 182,000. INR 1.19. In the ED, [name] was drowsy but opened her eyes and followed commands. Said that she had a HA the whole day yesterday but denies nausea, vomiting, vertigo, double vision, or language difficulty. She was admitted to the neuro ICU for further evaluation and treatment.   SUBJECTIVE (INTERVAL HISTORY) [name] remains lethargic today. RNs concerned pt is worse. Daughter and son at bedside are also present and very concerned. Neuro exam essentially unchanged. Pt has dehydration with concentrated urine. Will put on IV fluid due to lack of fluid input. Continue Abx. Repeat CXR and do blood culture. IM consult.    OBJECTIVE Temp:   [97.7 F (36.5 C)-100.6 F (38.1 C)] 98.1 F (36.7 C) (08/03 0900) Pulse Rate:  [88-96] 88 (08/03 0900) Cardiac Rhythm:  [-] Atrial fibrillation (08/03 0800) Resp:  [16-20] 20 (08/03 0900) BP: (120-130)/(51-64) 120/52 mmHg (08/03 0900) SpO2:  [94 %-100 %] 98 % (08/03 0900)  No results for input(s): GLUCAP in the last 168 hours.  Recent Labs Lab 03/23/15 1605  03/24/15 0442 03/24/15 1025 03/25/15 0425 03/26/15 0604 03/27/15 0733  NA 134*  < > 133* 133* 135 133* 140  K 3.6  --  3.6  --  3.7 4.4 4.2  CL 94*  --  95*  --  97* 96* 104  CO2 30  --  29  --  30 26 29   GLUCOSE 137*  --  129*  --  117* 137* 136*  BUN 18  --  27*  --  25* 26* 23*  CREATININE 1.08*  --  0.98  --  0.87 0.88 0.93  CALCIUM 8.1*  --  8.1*  --  8.4* 8.5* 8.6*  MG  --   --   --   --  1.9  --   --   PHOS  --   --   --   --  4.0  --   --   < > = values in this interval not displayed.  Recent Labs Lab 03/25/15 0425  AST 29  ALT 79*  ALKPHOS 48  BILITOT 1.2  PROT 5.4*  ALBUMIN 2.6*    Recent Labs Lab 03/22/15 0400 03/24/15 0442 03/25/15 0425 03/26/15 0604 03/27/15 0733  WBC  11.5* 12.2* 15.5* 20.0* 15.1*  NEUTROABS  --   --  12.7*  --   --   HGB 11.4* 12.0 11.6* 12.5 11.1*  HCT 34.2* 35.2* 33.5* 35.9* 33.0*  MCV 92.7 90.5 90.8 89.8 90.9  PLT 182 173 196 239 279    Recent Labs Lab 03/20/15 1520 03/20/15 2120 03/21/15 0800 03/21/15 1230 03/24/15 0442  CKTOTAL 88  --   --   --   --   CKMB 6.0*  --   --   --   --   TROPONINI 0.10* 0.06* 0.07* 0.09* 0.04*   No results for input(s): LABPROT, INR in the last 72 hours.  Recent Labs  03/26/15 0535  COLORURINE YELLOW  LABSPEC 1.014  PHURINE 5.5  GLUCOSEU NEGATIVE  HGBUR LARGE*  BILIRUBINUR NEGATIVE  KETONESUR NEGATIVE  PROTEINUR NEGATIVE  UROBILINOGEN 0.2  NITRITE POSITIVE*  LEUKOCYTESUR LARGE*       Component Value Date/Time   CHOL 132 03/25/2015 0425   TRIG 95 03/25/2015 0425   HDL 38* 03/25/2015 0425   CHOLHDL 3.5  03/25/2015 0425   VLDL 19 03/25/2015 0425   LDLCALC 75 03/25/2015 0425   Lab Results  Component Value Date   HGBA1C 5.8* 03/25/2015   No results found for: LABOPIA, COCAINSCRNUR, LABBENZ, AMPHETMU, THCU, LABBARB  No results for input(s): ETH in the last 168 hours.   Ct Head Wo Contrast 03/24/15 - Unchanged exam with right temporal intraparenchymal hemorrhage, 6 mm right to left midline shift, focal 4 mm right anterior parafalcine subdural hematoma and intraventricular hemorrhage again noted.  03/22/2015 : Expected evolution of multifocal intracranial hemorrhage without significant interval progression. The small subdural hemorrhage overlying the right temporal lobe is significantly less conspicuous on today's exam, the intraparenchymal hemorrhage within the right temporal lobe and small volume intraventricular hemorrhage layering within both occipital horns are essentially unchanged.  03/20/2015    1. No significant interval change in size of right temporal lobe intraparenchymal hemorrhage with slightly increased localized vasogenic edema. 2. No significant interval change in acute subdural hematoma overlying the right cerebral convexity measuring up to 5 mm in maximal thickness. Associated 6 mm of right-to-left shift is not significantly changed. 3. Small volume intraventricular hemorrhage, slightly increased from prior. Asymmetric dilatation of the left lateral ventricle is stable. No hydrocephalus. 4. No new intracranial process.     03/19/2015   Large intraparenchymal hemorrhage of the right temporal lobe with a 6 mm right frontal temporal acute extra-axial hemorrhage. There is right to left midline shift.    03/27/15 - No new hemorrhage. No increase in mass effect. Right temporal hematoma undergoing expected evolutionary changes, becoming less dense and less distinct.  Mr Brain Wo Contrast 03/20/2015   1. Stable size of large right temporal lobe parenchymal hemorrhage with layering blood  products. 2. Similar appearance of diffuse vasogenic edema and 8 mm midline shift. 3. Stable small right extra-axial hemorrhage. 4. Blood products layering within the ventricles bilaterally without hydrocephalus. 5. Stable chronic small vessel T2 changes.   Mr Jodene Nam Head Wo Contrast 03/20/2015  6. The MRA demonstrates displacement of the MCA branch vessels without a focal lesion to explain the hemorrhage. 7. Diffuse small vessel disease is present on the MRA.     Dg Chest Port 1 View 03/25/2015 1. Bilateral medial lung base opacity, mild, likely representing atelectasis although possibility of pneumonia not excluded and follow-up recommended. 2. Aortic atherosclerotic calcification 03/20/2015    Left internal jugular catheter is noted with distal tip  in expected position the SVC. No acute cardiopulmonary abnormality seen.    2D echo - Left ventricle: The cavity size was normal. Wall thickness wasincreased in a pattern of mild LVH. There was focal basalhypertrophy. Systolic function was vigorous. The estimatedejection fraction was in the range of 65% to 70%. Wall motion was normal; there were no regional wall motion abnormalities. - Aortic valve: Moderately calcified annulus. - Mitral valve: Severely calcified annulus. There was mild regurgitation. Valve area by continuity equation (using LVOT flow): 1.47 cm^2. - Left atrium: The atrium was moderately dilated.  EEG  03/25/15 -   1) Frequent right frontal sharp waves Clinical Interpretation: This EEG is consistent with a region of potential epileptogenicity in the right frontal region). No seizure was recorded.  03/26/15  1) Triphasic waves 2) Generalized slow activity.  Clinical Interpretation: This EEG is consistent with an encephalopathy. Triphasic waves are associai]ted with many kinds of toxic/metabolic encephalopathies including infection, hepatic failure, renal failure, medication effect among other causes.    PHYSICAL EXAM  Temp:   [97.7 F (36.5 C)-100.6 F (38.1 C)] 98.1 F (36.7 C) (08/03 0900) Pulse Rate:  [88-96] 88 (08/03 0900) Resp:  [16-20] 20 (08/03 0900) BP: (120-130)/(51-64) 120/52 mmHg (08/03 0900) SpO2:  [94 %-100 %] 98 % (08/03 0900)  General - Well nourished, well developed, lethargic, drowsy.  Ophthalmologic - Fundi not visualized due to noncooperation.  Cardiovascular - irregularly irregular heart rate and rhythm.  Neuro - [name] drowsy, sleepy, however easily arousable. Able to follow commands with encouragement. Answer questions with short sentences. Noncooperation on other language testing. Blinking to visual threat bilaterally, PERRL, facial symmetrical, tongue in middle, no upper or lower extremity drift but mild weakness of left grip and hip flexors with diminished fine finger movements on the left. Sensation symmetrical, DTR 1+, no Babinski. Coordination intact bilaterally. Gait not tested.    ASSESSMENT [name] is a 81 y.o. female with history of hypertension presenting with altered mental status, left hemiparesis, dysarthria.   Encephalopathy  Remains lethargic, though not significantly worse from yesterday. Did have low grade fever 100.6 over night.   Etiology unclear, most likely UTI reaction vs possible seizure vs reaction to keppra. Doubt related to hemorrhage as CT head today showed hemorrhage evolution.   WBC 11.1->12.2->15.5 ->20.0->15.1  UA positive for UTI on roephin; CXR neg on 8/1. Recheck CXR today negative for pneumonia.  EEG showed triphasic waves, consistent with encephalopathy  Add NS 75cc/hr IVF  Check blood cultures x 2  Repeat labs in am  No indication for step down at this time  Dr. Erlinda Hong discussed discussed diagnosis, prognosis,  treatment options and plan of care with daughter in detail.    Stroke: large right temporal intraparenchymal hemorrhage with IVH and cerebral edema secondary to either hypertension or cerebral amyloid angiopathy source  (given lobar location and relatively normotensive state on arrival lean toward amyloid)  Resultant  Left mild hemiparesis  MRI  R temporal lobe hmg w/ edema and 8 mm midline shift  MRA  No large vessel occlusion, no source of hemorrhage  Repeat CT head today showed expected evolution of right temporal hemorrhage.  Heparin subcutaneous for VTE prophylaxis Diet regular Room service appropriate?: Yes; Fluid consistency:: Thin.   aspirin 81 mg orally every day prior to admission, no antithrombotics due to [place].  Ongoing aggressive stroke risk factor management.   Therapy recommendations:  CIR.   Disposition:  CIR  Bradycardia w/ asystole  Transient HR 20s w/  asystole x 2 first night  HR stable overnight without further episodes  Elevated Troponins- mild likely of primary neurogenic etiology- [name] denies chest pain  2D echo EF 65-70%   ? Seizure   EEG showed frequent right frontal sharps  Concerning second episode of unresponsiveness is due to seizure  Put on Keppra 500 mg twice a day  Repeat EEG showed bilateral triphasic waves consistent with encephalopathy  Afib - new diagnosis  Showing up in tele  Confirmed on EKG  Not candidate for anticoagulation now.  Hypertension  Home meds:   Cozaar, labetalol  labetalol and HCTZ discontinued  Continue cozaar  Close monitoring  BP much stable at goal  Other Stroke Risk Factors  Advanced age  ETOH use  Urinary Tract Infection, hospital acquired with Urinary Rentention  WBC 11.1->12.2->15.5 ->20.0->15.1  UA w/ bacteria TNTC. culture pending  Foley put in 8/1 d/t 350cc retention   Started on rocephin D#2/5  Hospital day # [place] for Pager information 03/27/2015 11:12 AM   I, the attending vascular neurologist, have personally obtained a history, examined the [name], evaluated laboratory data, individually viewed imaging studies and agree with radiology  interpretations. I also discussed with daughter and son regarding her care plan. Together with the NP/PA, we formulated the assessment and plan of care which reflects our mutual decision.  I have made any additions or clarifications directly to the above note and agree with the findings and plan as currently documented.   81 year old female with history of hypertension admitted for right temporal ICH. Off 3% saline and transfer to floor. Hematoma was stable on repeat CT. However, developed lethargy, high WBC, repeat UA showed UTI, put on antibodies. EEG showed triphasic waves, consistent with encephalopathy. Initial EEG concerning for right hemisphere sharp waves, put on Keppra. [name] found to have A. fib, not a candidate for anticoagulation at this moment. Repeat CT today showed expected evolution of left temporal hemorrhage. CXR repeat negative for pneumonia. Blood culture pending. Labs in am. Had long discussion with daughter and son, updated them with pt current condition, diagnosis, treatment plan and expectations. They expressed understanding.   [name], MD PhD Stroke Neurology 03/27/2015 9:35 PM       To contact Stroke Continuity provider, please refer to [email or web address] After hours, contact General Neurology

## 2015-03-27 NOTE — Progress Notes (Signed)
OT Cancellation Note  Patient Details Name: [name] MRN: [id number] DOB: 1918-07-04   Cancelled Treatment:    Reason Eval/Treat Not Completed: Fatigue/lethargy limiting ability to participate  Pt only arousing briefly to sternal rub and verbal command.  Did not maintain more than a few seconds.  Discussed with daughter who is concerned as pt was doing a lot more in ICU.  She is waiting on the MD to discuss. Will continue to follow and may check back later today if pt is more alert.   Demarko Zeimet OTR/L 03/27/2015, 10:58 AM

## 2015-03-27 NOTE — Progress Notes (Signed)
Speech Language Pathology Treatment: Dysphagia  Patient Details Name: [name] MRN: [id number] DOB: [birth year] Today's Date: 03/27/2015 Time: 8299-3716 SLP Time Calculation (min) (ACUTE ONLY): 20 min  Assessment / Plan / Recommendation Clinical Impression  Dysphagia treatment provided today for diet tolerance check/ compensatory strategy training and education. Pt alert and responding to questions; however did demonstrate decreased sustained attention and fell asleep on 2 occasions during treatment. Pt had a throat clear x1 following large straw sip of thin liquid- pt would benefit from cup sips to better control sip size as pt did not follow directions to take smaller sips from the straw. Pt demonstrated a prolonged oral phase with regular solid (chicken) and minimal residuals on L side of tongue- pt followed cues to swallow 2nd time to clear the material but pt continued to have residuals. Given lethargy and cognitive status, pt is at increased risk of aspiration. Recommend downgrading to dysphagia 2 diet/ thin liquids with no straws to increase ease of mastication and to decrease risk of aspiration. Continue full supervision to assist with self-feeding and cue small sips/ bites. Will continue to follow for diet tolerance/ cognition.   HPI Other Pertinent Information: [name] is a 81 year old female with a past medical history significant for HTN, osteopenia, who was brought to [place] ED 03/18/15 via EMS for evaluation due to patient being found this AM in a sleepy state, poorly responsive and not speaking. Imaging revealed large right temporal lobe parenchymal hemorrhage, 8 mm midline shift and diffuse small vessel disease.  Patient initially passed RN stroke swallow screen; however, due to [place] orders received for bedside swallow and speech-language evaluation.     Pertinent Vitals Pain Assessment: Faces Faces Pain Scale: Hurts a little bit  SLP Plan  Continue with current  plan of care    Recommendations Diet recommendations: Dysphagia 2 (fine chop);Thin liquid Liquids provided via: Cup;No straw Medication Administration: Whole meds with puree Supervision: Staff to assist with self feeding;Full supervision/cueing for compensatory strategies Compensations: Slow rate;Small sips/bites;Check for pocketing Postural Changes and/or Swallow Maneuvers: Seated upright 90 degrees              Oral Care Recommendations: Oral care BID Follow up Recommendations: Inpatient Rehab;24 hour supervision/assistance Plan: Continue with current plan of care    [place], Amy K, [place], CCC-SLP 03/27/2015, 12:41 PM [phone]

## 2015-03-27 NOTE — Consult Note (Signed)
[facility] Medical Consultation  Charnel Giles [facility] LNL:[id number] DOB: [birth year] DOA: 03/19/2015 PCP: Garret Reddish, MD   Requesting physician: Dr. Erlinda Hong Date of consultation: 03/27/2015 Reason for consultation: lethargy and general medical management of a patient with intracranial hematoma.  Impression/Recommendations Active Problems:   Cerebral parenchymal hemorrhage   Cytotoxic brain edema   Brain herniation   Brain bleed   Cerebral hemorrhage   ICH (intracerebral hemorrhage)   Seizures    Lethargy appears to have been from urinary tract infection as it is now improving after starting ceftriaxone. Agree with 1 g ceftriaxone daily as primary service has ordered urine cultures and blood cultures are pending.  Follow-up chest x-rays negative, and head CT shows no worsening of vasogenic edema or midline shift. Patient appears to be eating and drinking well patient remains on Keppra for seizure prophylaxis.  Urinary tract infection follow cultures on Rocephin  Intracranial hematoma with midline shift management per neurology.  Hypertension well-controlled with losartan and labetalol  Code status: DNR discussed with patients daughter [name]. [name] would like her mother to receive aggressive curative treatment however she does not want her to be intubated, or receive chest compressions. She is comfortable with IV fluids, BiPAP, antibiotics, pressers if needed.  TR[facility] will followup again tomorrow. Please contact me if I can be of assistance in the meanwhile. Thank you for this consultation.  Chief Complaint: lethargy  HPI:  this is a 81 year old female with hypertension and osteopenia who presented to [facility] on July 26 with altered mental status, left hemiparesis and dysarthria. She was found to have a large intracerebral hemorrhage with a right to left midline shift of 6 mm. Neurosurgery was consulted and they advised conservative care. Per her daughter's report  on Thursday, July 28 the patient was able to walk, she was making jokes, she is doing very well while still in ICU.  On Sunday she was intermittently confused and her white count was starting to rise. It was felt that she could've possibly had a seizure and she was placed on Keppra. She became more lethargic and less interactive.  On 8/1 chest x-ray showed atelectasis versus possible mid-lobe pneumonia. On 8/2 urinalysis indicated UTI.  The primary team placed the patient on Rocephin for urinary tract infection. Still the patient remained lethargic and noninteractive. On 8/3 chest x-ray is essentially clear, all my exam the patient is now awake alert and eating. Per her daughter she appears much better.  On 8/3 patients white count has decreased from 20 to 15.1.  Sodium is 140, creatinine is 0.93, hemoglobin is 1.1.  Review of Systems:  patient complains of some right knee pain. Otherwise denies cough, headache, chest pain, shortness of breath, abdominal pain, constipation, diarrhea, dysuria.  Past Medical History  Diagnosis Date  . Hypertension   . Osteopenia    Past Surgical History  Procedure Laterality Date  . Cataract extraction     Social History:  reports that she has never smoked. She has never used smokeless tobacco. She reports that she drinks about 0.6 oz of alcohol per week. She reports that she does not use illicit drugs. lives at home with her husband and daughter. Was independent with ADLs prior to admission.  Allergies  Allergen Reactions  . Ace Inhibitors     REACTION: angioedema   Family History  Problem Relation Age of Onset  . Cancer Daughter     breast    Prior to Admission medications   Medication Sig Start Date  End Date Taking? Authorizing Provider  aspirin EC 81 MG tablet Take 81 mg by mouth daily.   Yes Historical Provider, MD  labetalol (NORMODYNE) 100 MG tablet TAKE 1 TABLET BY MOUTH TWICE A DAY 12/03/14  Yes Marin Olp, MD  losartan (COZAAR) 100 MG  tablet Take 1 tablet (100 mg total) by mouth daily. 01/08/15  Yes Marin Olp, MD  Multiple Vitamin (MULTIVITAMIN) tablet Take 1 tablet by mouth daily.     Yes Historical Provider, MD   Physical Exam: Blood pressure 111/48, pulse 92, temperature 98.4 F (36.9 C), temperature source Oral, resp. rate 16, height 4\' 11"  (1.499 m), weight 54.885 kg (121 lb), SpO2 98 %. Filed Vitals:   03/27/15 1739  BP: 111/48  Pulse: 92  Temp: 98.4 F (36.9 C)  Resp: 16     General:  thin frail elderly female, being fed chopped food by her daughter at bedside  Eyes: pupils are unequal right greater than left, square clear conjunctiva pink, E OMI   ENT: no erythema or exudates  Neck: supple  Cardiovascular: regular rate and rhythm, no murmurs, rubs, gallops  Respiratory: CTA, no wheezes crackles around  Abdomen: soft, non-tender, non-distended, positive bowel sounds, no masses  Skin: no rash or lesions, small bruises apparent on arms  Musculoskeletal: able to move all 4 extremities  Psychiatric: awake but sleepy, answers questions with short answers appropriately, pleasantly demented  Neurologic: follows commands, will move all extremities, sensation intact.  Labs on Admission:  Basic Metabolic Panel:  Recent Labs Lab 03/23/15 1605  03/24/15 0442 03/24/15 1025 03/25/15 0425 03/26/15 0604 03/27/15 0733  NA 134*  < > 133* 133* 135 133* 140  K 3.6  --  3.6  --  3.7 4.4 4.2  CL 94*  --  95*  --  97* 96* 104  CO2 30  --  29  --  30 26 29   GLUCOSE 137*  --  129*  --  117* 137* 136*  BUN 18  --  27*  --  25* 26* 23*  CREATININE 1.08*  --  0.98  --  0.87 0.88 0.93  CALCIUM 8.1*  --  8.1*  --  8.4* 8.5* 8.6*  MG  --   --   --   --  1.9  --   --   PHOS  --   --   --   --  4.0  --   --   < > = values in this interval not displayed. Liver Function Tests:  Recent Labs Lab 03/25/15 0425  AST 29  ALT 79*  ALKPHOS 48  BILITOT 1.2  PROT 5.4*  ALBUMIN 2.6*   CBC:  Recent  Labs Lab 03/22/15 0400 03/24/15 0442 03/25/15 0425 03/26/15 0604 03/27/15 0733  WBC 11.5* 12.2* 15.5* 20.0* 15.1*  NEUTROABS  --   --  12.7*  --   --   HGB 11.4* 12.0 11.6* 12.5 11.1*  HCT 34.2* 35.2* 33.5* 35.9* 33.0*  MCV 92.7 90.5 90.8 89.8 90.9  PLT 182 173 196 239 279   Cardiac Enzymes:  Recent Labs Lab 03/20/15 2120 03/21/15 0800 03/21/15 1230 03/24/15 0442  TROPONINI 0.06* 0.07* 0.09* 0.04*    Radiological Exams on Admission: Dg Chest 2 View  03/27/2015   CLINICAL DATA:  Pneumonia.  EXAM: CHEST  2 VIEW  COMPARISON:  03/25/2015.  FINDINGS: Mediastinum and hilar structures are normal. Heart size stable. Near complete clearing of bibasilar atelectasis and/or infiltrates. No pleural effusion  pneumothorax.  IMPRESSION: Near complete clearing of bibasilar atelectasis and/or infiltrates.   Electronically Signed   By: Marcello Moores  Register   On: 03/27/2015 15:58   Ct Head Wo Contrast  03/27/2015   CLINICAL DATA:  Followup intra cerebral hemorrhage.  EXAM: CT HEAD WITHOUT CONTRAST  TECHNIQUE: Contiguous axial images were obtained from the base of the skull through the vertex without intravenous contrast.  COMPARISON:  03/24/2015  FINDINGS: Intraparenchymal hematoma in the right temporal lobe is no larger. The hematoma is becoming less dense and less distinct and precise comparative measurement cannot be done, but there is certainly no additional or hyper acute bleeding. The amount of vasogenic edema appears very similar. Mass effect with right-to-left shift of 5 mm appears the same. No evidence of ventricular trapping. No extra-axial penetration. Small meningioma along the right side of the falx is unchanged.  IMPRESSION: No new hemorrhage. No increase in mass effect. Right temporal hematoma undergoing expected evolutionary changes, becoming less dense and less distinct.   Electronically Signed   By: Nelson Chimes M.D.   On: 03/27/2015 16:08   Dg Chest Port 1 View  03/25/2015   CLINICAL DATA:   Leukocytosis  EXAM: PORTABLE CHEST - 1 VIEW  COMPARISON:  03/20/2015  FINDINGS: Heart size upper normal. Aortic calcifications stable. Vascular pattern normal. Mild bibasilar medial opacity likely represents atelectasis although the possibility of pneumonia/ pneumonitis is not excluded. No consolidation or effusion.  IMPRESSION: 1. Bilateral medial lung base opacity, mild, likely representing atelectasis although possibility of pneumonia not excluded and follow-up recommended. 2.  Aortic atherosclerotic calcification   Electronically Signed   By: Skipper Cliche M.D.   On: 03/25/2015 21:00    EKG: from 8/1- atrial fibrillation  Time spent: 45 min.  Karen Kitchens [facility] Pager [phone]  If 7PM-7AM, please contact night-coverage [email or web address] Password [facility] 03/27/2015, 6:40 PM   I have personally supervised Melton Alar, PA-C, agreed with her findings and therapy plan.

## 2015-03-28 ENCOUNTER — Inpatient Hospital Stay (HOSPITAL_COMMUNITY): Payer: Medicare Other

## 2015-03-28 DIAGNOSIS — D72829 Elevated white blood cell count, unspecified: Secondary | ICD-10-CM | POA: Insufficient documentation

## 2015-03-28 DIAGNOSIS — R5383 Other fatigue: Secondary | ICD-10-CM | POA: Insufficient documentation

## 2015-03-28 DIAGNOSIS — N390 Urinary tract infection, site not specified: Secondary | ICD-10-CM | POA: Diagnosis not present

## 2015-03-28 DIAGNOSIS — R4182 Altered mental status, unspecified: Secondary | ICD-10-CM | POA: Insufficient documentation

## 2015-03-28 LAB — CBC
HCT: 31.1 % — ABNORMAL LOW (ref 36.0–46.0)
Hemoglobin: 10.2 g/dL — ABNORMAL LOW (ref 12.0–15.0)
MCH: 30 pg (ref 26.0–34.0)
MCHC: 32.8 g/dL (ref 30.0–36.0)
MCV: 91.5 fL (ref 78.0–100.0)
PLATELETS: 269 10*3/uL (ref 150–400)
RBC: 3.4 MIL/uL — ABNORMAL LOW (ref 3.87–5.11)
RDW: 13.2 % (ref 11.5–15.5)
WBC: 11 10*3/uL — AB (ref 4.0–10.5)

## 2015-03-28 LAB — BASIC METABOLIC PANEL
ANION GAP: 7 (ref 5–15)
BUN: 23 mg/dL — AB (ref 6–20)
CO2: 26 mmol/L (ref 22–32)
Calcium: 8.1 mg/dL — ABNORMAL LOW (ref 8.9–10.3)
Chloride: 106 mmol/L (ref 101–111)
Creatinine, Ser: 0.82 mg/dL (ref 0.44–1.00)
GFR calc Af Amer: >60 mL/min (ref >60)
GFR calc non Af Amer: 59 mL/min — ABNORMAL LOW (ref >60)
Glucose, Bld: 139 mg/dL — ABNORMAL HIGH (ref 65–99)
Potassium: 4.1 mmol/L (ref 3.5–5.1)
Sodium: 139 mmol/L (ref 135–145)

## 2015-03-28 LAB — URINE CULTURE: CULTURE: NO GROWTH

## 2015-03-28 MED ORDER — CEFUROXIME AXETIL 500 MG PO TABS
500.0000 mg | ORAL_TABLET | Freq: Two times a day (BID) | ORAL | Status: DC
  Administered 2015-03-29: 500 mg via ORAL
  Filled 2015-03-28: qty 1

## 2015-03-28 NOTE — Progress Notes (Signed)
Rehab admissions - I met with patient and her daughter, [name].  Patient would like inpatient rehab.  She looks much more alert today, eating her lunch, talking with me.  Can plan to admit to inpatient rehab tomorrow if she is medically ready.  I will follow up in am.  Call me for questions.  #[phone]

## 2015-03-28 NOTE — PMR Pre-admission (Signed)
PMR Admission Coordinator Pre-Admission Assessment  Patient: [name] is an 81 y.o., female MRN: [id number] DOB: 18-Mar-1918 Height: '4\' 11"'  (149.9 cm) Weight: 54.885 kg (121 lb)              Insurance Information HMO: Yes    PPO:       PCP:       IPA:       80/20:       OTHER:  Group # [id number] PRIMARY: [employer] Medicare      Policy#: [id number]      Subscriber: [name] CM Name: [name]      Phone#: [phone]     Fax#:   Pre-Cert#: [id number]      Employer: Retired Benefits:  Phone #: [phone]     Name: On line Eff. Date: 08/24/14     Deduct: $0      Out of Pocket Max: $4900 (met $118.10)      Life Max: None CIR: $345 days 1-5      SNF: $0 days 1-20; $160 days 21-51; $0 days 52-100 Outpatient: medical necessity     Co-Pay: $40 copay Home Health: 100%      Co-Pay: 20% DME: 80%     Co-Pay: 20% Providers: in network  Emergency Contact Information Contact Information    Name Relation Home Work Mobile   [name],Carolyn Daughter [phone]  [phone]   Carolyn, [name] [phone]  [phone]     Current Medical History  Patient Admitting Diagnosis:Right temporal-parietal intraparenchymal hemorrhage  History of Present Illness: A 81 y.o. right handed female with history of hypertension. Independent prior to admission without assistive device and still driving. Presented 03/19/2015 with altered mental status left-sided weakness and slurred speech. Blood pressure in the ED was 140/80. CT of the head and imaging showed a large right temporal intraparenchymal hematoma. Right to left midline shift of 6 mm. Troponin 0.4 felt to be related to demand ischemia and no other work up indicated. Neurosurgery consulted and advised conservative care.Keppra for seizure prophylaxis. Follow-up CT of the brain stable without hydrocephalus. MRA of the head with diffuse small vessel disease. EEG completed consistent with encephalopathy and no seizure activity recorded. Echocardiogram with  ejection fraction of 70% no wall motion abnormalities. Subcutaneous heparin for DVT prophylaxis initiated 03/25/2015. Follow-up cranial CT scan 03/27/2015 shows no new hemorrhage or mass effect. Patient with elevated WBC on 03/26/15 and believed to be UTI, antibiotics started.  WBC trending down.  Currently maintained on the dysphagia #2 thin liquids.. Bouts of hypokalemia resolved with potassium supplement. Physical and occupational therapy evaluation completed 03/21/2015 with recommendations of physical medicine rehabilitation consult. Patient to be admitted for a comprehensive inpatient rehabilitation program.     Total: 12=NIH  Past Medical History  Past Medical History  Diagnosis Date  . Hypertension   . Osteopenia     Family History  family history includes Cancer in her daughter.  Prior Rehab/Hospitalizations:  Has the patient had major surgery during 100 days prior to admission? No  Current Medications   Current facility-administered medications:  .   stroke: mapping our early stages of recovery book, , Does not apply, Once, Carolyn Portland, MD .  0.9 %  sodium chloride infusion, , Intravenous, Continuous, Carolyn Redwood, MD, Last Rate: 50 mL/hr at 03/28/15 2223 .  acetaminophen (TYLENOL) tablet 650 mg, 650 mg, Oral, Q4H PRN, 650 mg at 03/28/15 2110 **OR** [DISCONTINUED] acetaminophen (TYLENOL) suppository 650 mg, 650 mg, Rectal, Q4H PRN, Carolyn Portland,  MD, 650 mg at 03/20/15 2127 .  antiseptic oral rinse (CPC / CETYLPYRIDINIUM CHLORIDE 0.05%) solution 7 mL, 7 mL, Mouth Rinse, q12n4p, [name], MD, 7 mL at 03/29/15 1200 .  cefUROXime (CEFTIN) tablet 500 mg, 500 mg, Oral, BID WC, Carolyn Redwood, MD, 500 mg at 03/29/15 0926 .  chlorhexidine (PERIDEX) 0.12 % solution 15 mL, 15 mL, Mouth Rinse, BID, [name], MD, 15 mL at 03/29/15 0925 .  diclofenac sodium (VOLTAREN) 1 % transdermal gel 2 g, 2 g, Topical, QID, Garvin Fila, MD, 2 g at 03/29/15 1007 .  feeding  supplement (ENSURE ENLIVE) (ENSURE ENLIVE) liquid 237 mL, 237 mL, Oral, BID PRN, [name], RD .  heparin injection 5,000 Units, 5,000 Units, Subcutaneous, Q12H, Carolyn Hawking, MD, 5,000 Units at 03/29/15 0926 .  labetalol (NORMODYNE,TRANDATE) injection 20 mg, 20 mg, Intravenous, Q2H PRN, [name], 20 mg at 03/23/15 0825 .  levETIRAcetam (KEPPRA) tablet 500 mg, 500 mg, Oral, BID, Garvin Fila, MD, 500 mg at 03/29/15 0926 .  losartan (COZAAR) tablet 100 mg, 100 mg, Oral, Daily, Garvin Fila, MD, 100 mg at 03/29/15 0926 .  pantoprazole (PROTONIX) EC tablet 40 mg, 40 mg, Oral, QHS, Garvin Fila, MD, 40 mg at 03/28/15 2101 .  polyethylene glycol (MIRALAX / GLYCOLAX) packet 17 g, 17 g, Oral, Daily PRN, Carolyn Gosselin, MD .  senna-docusate (Senokot-S) tablet 1 tablet, 1 tablet, Oral, BID, Carolyn Portland, MD, 1 tablet at 03/29/15 1219  Patients Current Diet: DIET DYS 2 Room service appropriate?: Yes; Fluid consistency:: Thin  Precautions / Restrictions Precautions Precautions: Fall Precautions/Special Needs: Other Precaution Comments: Seizure precautions Restrictions Weight Bearing Restrictions: No   Has the patient had 2 or more falls or a fall with injury in the past year?No  Prior Activity Level Community (5-7x/wk): Went out 4 X a week.  Drove short distances to beauty shop, drug store.  Home Assistive Devices / Equipment Home Equipment: Shower seat - built in, Grab bars - tub/shower, Grab bars - toilet  Prior Device Use: Indicate devices/aids used by the patient prior to current illness, exacerbation or injury? None of the above.  Did use a walker for 4-5 days recently when her knee was bothering her.  Prior Functional Level Prior Function Level of Independence: Independent Comments: Pt reports she was independent with ADLs. Occasionally sat to shower, but often stands.  Does not use AD for ambulation.  Spouse is currently in SNF rehab   Self Care: Did the patient  need help bathing, dressing, using the toilet or eating?  Independent  Indoor Mobility: Did the patient need assistance with walking from room to room (with or without device)? Independent  Stairs: Did the patient need assistance with internal or external stairs (with or without device)? Independent.  Does have a chair lift up/down from her apartment in daughters home.  Functional Cognition: Did the patient need help planning regular tasks such as shopping or remembering to take medications? Independent  Current Functional Level Cognition  Arousal/Alertness: Awake/alert Overall Cognitive Status: Impaired/Different from baseline Current Attention Level: Sustained Orientation Level: Oriented to person, Oriented to place Following Commands: Follows one step commands consistently Safety/Judgement: Decreased awareness of deficits General Comments: improved arousal this session compared to previous session. Patient also more consistent with follow commands. Attention: Sustained Sustained Attention: Appears intact Memory: Impaired Memory Impairment: Decreased recall of new information Awareness: Appears intact Problem Solving: Appears intact (with basic) Safety/Judgment: Appears intact  Extremity Assessment (includes Sensation/Coordination)  Upper Extremity Assessment: Defer to OT evaluation RUE Deficits / Details: tremors noted - pt indicates these are new, but no family present to determine her baseline  RUE Coordination: decreased fine motor, decreased gross motor LUE Deficits / Details: tremors noted - pt indicates these are new, but no family present to determine her baseline LUE Coordination: decreased fine motor, decreased gross motor  Lower Extremity Assessment: Overall WFL for tasks assessed, Generalized weakness    ADLs  Overall ADL's : Needs assistance/impaired Eating/Feeding: Minimal assistance, Moderate assistance, Sitting Eating/Feeding Details (indicate cue type and  reason): min A to drink from cup.  Mod A to manage utensils  Grooming: Wash/dry hands, Wash/dry face, Minimal assistance, Sitting Upper Body Bathing: Maximal assistance, Sitting Lower Body Bathing: Maximal assistance, Sit to/from stand Upper Body Dressing : Maximal assistance, Sitting Lower Body Dressing: Total assistance, Sit to/from stand Toilet Transfer: Minimal assistance, Ambulation, BSC Toileting- Clothing Manipulation and Hygiene: Maximal assistance, Sit to/from stand Functional mobility during ADLs: +2 for physical assistance, Minimal assistance    Mobility  Overal bed mobility: Needs Assistance Bed Mobility: Rolling, Sidelying to Sit Rolling: Mod assist Sidelying to sit: Mod assist Supine to sit: Max assist Sit to supine: Max assist General bed mobility comments: patient cues for initiation of movement LEs to EOB, patient able to provide some assist to come to EOB with increased assist required for poer uup to sitting. improved sitting balance and attempts to scoot to EOb this session    Transfers  Overall transfer level: Needs assistance Equipment used: 2 person hand held assist Transfers: Sit to/from Stand Sit to Stand: +2 physical assistance, Mod assist Stand pivot transfers: Mod assist, +2 physical assistance General transfer comment: cues for hand placement; assist to come forward and power up; tactile cues to advance/step LLE towards chair. Faciliatation of upright postuer required.    Ambulation / Gait / Stairs / Wheelchair Mobility  Ambulation/Gait Ambulation/Gait assistance: Museum/gallery curator (Feet): 10 Feet (14 feet to recliner) Assistive device: Rolling walker (2 wheeled) Gait Pattern/deviations: Decreased step length - right, Decreased step length - left, Step-through pattern General Gait Details: unable due to lethargy Gait velocity: very slow and tentative    Posture / Balance Dynamic Sitting Balance Sitting balance - Comments: able to sit EOB  without physical assist this session Balance Overall balance assessment: Needs assistance Sitting-balance support: Feet supported Sitting balance-Leahy Scale: Fair Sitting balance - Comments: able to sit EOB without physical assist this session Standing balance support: Bilateral upper extremity supported Standing balance-Leahy Scale: Poor Standing balance comment: needs RW or HHA    Special needs/care consideration BiPAP/CPAP No CPM No Continuous Drip IV 0.9% NS 30 ml/hr Dialysis No       Life Vest No Oxygen No Special Bed No Trach Size No Wound Vac (area) No    Skin No                             Bowel mgmt: Last BM 03/26/15 with incontinence Bladder mgmt: Urinary catheter Diabetic mgmt No    Previous Home Environment Living Arrangements: Children Available Help at Discharge: Family (unsure) Type of Home: House Home Layout: Two level, Able to live on main level with bedroom/bathroom Alternate Level Stairs-Number of Steps: flight - pt has stair lift  Home Access: Stairs to enter [employer] Shower/Tub: Multimedia programmer: Handicapped height Bathroom Accessibility: Yes How Accessible: Accessible via wheelchair Additional Comments:  Pt and spouse live with her daughter and son in law.  They have an apartment in the basement of the house.  However, they have to walk around to the back of the house to access the door to their apartment.  Or, they can enter in the front door and has stair lift to basement apartment   Discharge Living Setting Plans for Discharge Living Setting: House, Lives with (comment) (Lives with daughter and son-in-law apt in home.) Type of Home at Discharge: House Discharge Home Layout: Multi-level, Able to live on main level with bedroom/bathroom (Lives in bottom level in law apartment.) Alternate Level Stairs-Number of Steps: Has a chair lift Discharge Home Access: Level entry (Building ramp at patio out back.)  [place] Patient Roles: Spouse, Parent (Has a husband, daughter, son-in-law.) Contact Information: Fredrich Birks - daughter Anticipated Caregiver: daughter and hired help Anticipated Caregiver's Contact Information: [name] - daughter (c) [phone] Ability/Limitations of Caregiver: Husband coming home 08/05 from [facility]  Dtr retired and can assist.  Can hire help as needed. Caregiver Availability: 24/7 Discharge Plan Discussed with Primary Caregiver: Yes Is Caregiver In Agreement with Plan?: Yes Does Caregiver/Family have Issues with Lodging/Transportation while Pt is in Rehab?: No  Goals/Additional Needs Patient/Family Goal for Rehab: PT/OT/ST supervision goals Expected length of stay: 7-10 days Cultural Considerations: None Dietary Needs: Dys 2, thin liquids Equipment Needs: TBD Pt/Family Agrees to Admission and willing to participate: Yes Program Orientation Provided & Reviewed with Pt/Caregiver Including Roles  & Responsibilities: Yes  Decrease burden of Care through IP rehab admission: N/A  Possible need for SNF placement upon discharge: Yes, if patient does not progress to level where daughter and care givers can manage at home  Patient Condition: This patient's medical and functional status has changed since the consult dated: 03/22/15 in which the Rehabilitation Physician determined and documented that the patient's condition is appropriate for intensive rehabilitative care in an inpatient rehabilitation facility. See "History of Present Illness" (above) for medical update. Functional changes are:  Currently requiring mod assist +2 for stand pivot transfers. Patient's medical and functional status update has been discussed with the Rehabilitation physician and patient remains appropriate for inpatient rehabilitation. Will admit to inpatient rehab today.  Preadmission Screen Completed By:  [name], 03/29/2015 10:35  AM ______________________________________________________________________   Discussed status with Dr. Naaman Plummer on 03/29/15 at 1035 and received telephone approval for admission today.  Admission Coordinator:  [name], time1035/Date08/05/16

## 2015-03-28 NOTE — Progress Notes (Signed)
STROKE TEAM PROGRESS NOTE   HISTORY [name] is an 81 y.o. female with a past medical history significant for HTN, osteopenia, brought to [facility] via EMS for evaluation of altered mental status, left hemiparesis, dysarthria. Daughter and son in law were at the bedside and stated that they last saw her normal last night (LKW 03/18/2015 at 2100) and this morning 03/19/2015 they found her sitting in the bathroom acting sleepy, poorly responsive and not speaking". They said that at baseline she is very active and functional and this was a dramatic change for her and thus EMS was summoned. When EMS arrived they report that patient was having slurred speech and not moving the left side. BP was apparently not significantly elevated initially and as a matter of fact BP 140/80 in the ED. No recent head trauma. Patient is not taking anticoagulants but has been on baby aspirin for the last couple of months. CT brain performed in the ED was reviewed and showed a large intraparenchymal hemorrhage of the right temporal lobe with a 6 mm right frontal temporal acute extra-axial hemorrhage with associated right to left midline shift. Neurosurgery consulted by ED attending and I was informed that patient was not considered a surgical candidate. Serologies: platelet count 182,000. INR 1.19. In the ED, [name] was drowsy but opened her eyes and followed commands. Said that she had a HA the whole day yesterday but denies nausea, vomiting, vertigo, double vision, or language difficulty. She was admitted to the neuro ICU for further evaluation and treatment.   SUBJECTIVE (INTERVAL HISTORY) No family members present. The patient voices no specific complaints. She is more awake alert today and answers questions appropriately and able to follow all commands. She admits to having a poor appetite. The patient's nurse reports that the patient has had confusion.   OBJECTIVE Temp:  [98.1 F (36.7 C)-99.5 F (37.5 C)] 99.5  F (37.5 C) (08/04 1322) Pulse Rate:  [81-93] 93 (08/04 1322) Cardiac Rhythm:  [-] Atrial flutter (08/04 0800) Resp:  [16-18] 18 (08/04 1322) BP: (110-157)/(48-72) 127/56 mmHg (08/04 1322) SpO2:  [98 %-100 %] 99 % (08/04 1322)  No results for input(s): GLUCAP in the last 168 hours.  Recent Labs Lab 03/24/15 0442 03/24/15 1025 03/25/15 0425 03/26/15 0604 03/27/15 0733 03/28/15 0444  NA 133* 133* 135 133* 140 139  K 3.6  --  3.7 4.4 4.2 4.1  CL 95*  --  97* 96* 104 106  CO2 29  --  30 26 29 26   GLUCOSE 129*  --  117* 137* 136* 139*  BUN 27*  --  25* 26* 23* 23*  CREATININE 0.98  --  0.87 0.88 0.93 0.82  CALCIUM 8.1*  --  8.4* 8.5* 8.6* 8.1*  MG  --   --  1.9  --   --   --   PHOS  --   --  4.0  --   --   --     Recent Labs Lab 03/25/15 0425  AST 29  ALT 79*  ALKPHOS 48  BILITOT 1.2  PROT 5.4*  ALBUMIN 2.6*    Recent Labs Lab 03/24/15 0442 03/25/15 0425 03/26/15 0604 03/27/15 0733 03/28/15 0444  WBC 12.2* 15.5* 20.0* 15.1* 11.0*  NEUTROABS  --  12.7*  --   --   --   HGB 12.0 11.6* 12.5 11.1* 10.2*  HCT 35.2* 33.5* 35.9* 33.0* 31.1*  MCV 90.5 90.8 89.8 90.9 91.5  PLT 173 196 239 279 269  Recent Labs Lab 03/24/15 0442  TROPONINI 0.04*   No results for input(s): LABPROT, INR in the last 72 hours.  Recent Labs  03/26/15 0535  COLORURINE YELLOW  LABSPEC 1.014  PHURINE 5.5  GLUCOSEU NEGATIVE  HGBUR LARGE*  BILIRUBINUR NEGATIVE  KETONESUR NEGATIVE  PROTEINUR NEGATIVE  UROBILINOGEN 0.2  NITRITE POSITIVE*  LEUKOCYTESUR LARGE*       Component Value Date/Time   CHOL 132 03/25/2015 0425   TRIG 95 03/25/2015 0425   HDL 38* 03/25/2015 0425   CHOLHDL 3.5 03/25/2015 0425   VLDL 19 03/25/2015 0425   LDLCALC 75 03/25/2015 0425   Lab Results  Component Value Date   HGBA1C 5.8* 03/25/2015   No results found for: LABOPIA, COCAINSCRNUR, LABBENZ, AMPHETMU, THCU, LABBARB  No results for input(s): ETH in the last 168 hours.  I have personally  reviewed the radiological images below and agree with the radiology interpretations.  Ct Head Wo Contrast 03/24/15 - Unchanged exam with right temporal intraparenchymal hemorrhage, 6 mm right to left midline shift, focal 4 mm right anterior parafalcine subdural hematoma and intraventricular hemorrhage again noted.  03/22/2015 : Expected evolution of multifocal intracranial hemorrhage without significant interval progression. The small subdural hemorrhage overlying the right temporal lobe is significantly less conspicuous on today's exam, the intraparenchymal hemorrhage within the right temporal lobe and small volume intraventricular hemorrhage layering within both occipital horns are essentially unchanged.  03/20/2015    1. No significant interval change in size of right temporal lobe intraparenchymal hemorrhage with slightly increased localized vasogenic edema. 2. No significant interval change in acute subdural hematoma overlying the right cerebral convexity measuring up to 5 mm in maximal thickness. Associated 6 mm of right-to-left shift is not significantly changed. 3. Small volume intraventricular hemorrhage, slightly increased from prior. Asymmetric dilatation of the left lateral ventricle is stable. No hydrocephalus. 4. No new intracranial process.     03/19/2015   Large intraparenchymal hemorrhage of the right temporal lobe with a 6 mm right frontal temporal acute extra-axial hemorrhage. There is right to left midline shift.    03/27/15 - No new hemorrhage. No increase in mass effect. Right temporal hematoma undergoing expected evolutionary changes, becoming less dense and less distinct.  Mr Brain Wo Contrast 03/20/2015   1. Stable size of large right temporal lobe parenchymal hemorrhage with layering blood products. 2. Similar appearance of diffuse vasogenic edema and 8 mm midline shift. 3. Stable small right extra-axial hemorrhage. 4. Blood products layering within the ventricles bilaterally  without hydrocephalus. 5. Stable chronic small vessel T2 changes.   Mr Jodene Nam Head Wo Contrast 03/20/2015  6. The MRA demonstrates displacement of the MCA branch vessels without a focal lesion to explain the hemorrhage. 7. Diffuse small vessel disease is present on the MRA.     Dg Chest Port 1 View 03/25/2015 1. Bilateral medial lung base opacity, mild, likely representing atelectasis although possibility of pneumonia not excluded and follow-up recommended. 2. Aortic atherosclerotic calcification 03/20/2015    Left internal jugular catheter is noted with distal tip in expected position the SVC. No acute cardiopulmonary abnormality seen.    03/28/2015 No acute cardiopulmonary disease. Chest is stable from prior exam.  2D echo - Left ventricle: The cavity size was normal. Wall thickness wasincreased in a pattern of mild LVH. There was focal basalhypertrophy. Systolic function was vigorous. The estimatedejection fraction was in the range of 65% to 70%. Wall motion was normal; there were no regional wall motion abnormalities. - Aortic valve: Moderately calcified  annulus. - Mitral valve: Severely calcified annulus. There was mild regurgitation. Valve area by continuity equation (using LVOT flow): 1.47 cm^2. - Left atrium: The atrium was moderately dilated.  EEG  03/25/15 -   1) Frequent right frontal sharp waves Clinical Interpretation: This EEG is consistent with a region of potential epileptogenicity in the right frontal region). No seizure was recorded.  03/26/15  1) Triphasic waves 2) Generalized slow activity.  Clinical Interpretation: This EEG is consistent with an encephalopathy. Triphasic waves are associai]ted with many kinds of toxic/metabolic encephalopathies including infection, hepatic failure, renal failure, medication effect among other causes.    PHYSICAL EXAM  Temp:  [98.1 F (36.7 C)-99.5 F (37.5 C)] 99.5 F (37.5 C) (08/04 1322) Pulse Rate:  [81-93] 93 (08/04  1322) Resp:  [16-18] 18 (08/04 1322) BP: (110-157)/(48-72) 127/56 mmHg (08/04 1322) SpO2:  [98 %-100 %] 99 % (08/04 1322)  General - Well nourished, well developed, mild lethargy.  Ophthalmologic - Fundi not visualized due to noncooperation.  Cardiovascular - irregularly irregular heart rate and rhythm.  Neuro - patient mildly lethargic, able to answer questions and follow commands. Blinking to visual threat bilaterally, PERRL, facial symmetrical, tongue in middle, 4/5 all extremities. Sensation symmetrical, DTR 1+, no Babinski. Coordination intact bilaterally. Gait not tested.    ASSESSMENT [name] is a 81 y.o. female with history of hypertension presenting with altered mental status, left hemiparesis, dysarthria.   Encephalopathy  More alert awake today.  Etiology most likely UTI reaction, less likely possible seizure vs reaction to keppra. Doubt related to hemorrhage as CT head repeat showed hemorrhage evolution.   WBC 11.1->12.2->15.5 ->20.0->15.1 -> 11.0  UA positive for UTI  - On Ceftin (changed from Rocephin per internal medicine) day # 3/5; CXR neg on 8/1. Recheck CXR today negative for pneumonia.  EEG showed triphasic waves, consistent with encephalopathy  Continue NS @ 30cc/h  Check blood cultures x 2 - no growth day 2  Repeat labs in am - WBCs continue to trend down. BUN 23 no change in GFR 59 improving  No indication for step down at this time  Dr. Erlinda Hong discussed discussed diagnosis, prognosis,  treatment options and plan of care with daughter in detail.    Stroke: large right temporal intraparenchymal hemorrhage with IVH and cerebral edema secondary to either hypertension or cerebral amyloid angiopathy source (given lobar location and relatively normotensive state on arrival lean toward amyloid)  Resultant  Left mild hemiparesis  MRI  R temporal lobe hmg w/ edema and 8 mm midline shift  MRA  No large vessel occlusion, no source of  hemorrhage  Repeat CT head today showed expected evolution of right temporal hemorrhage.  Heparin subcutaneous for VTE prophylaxis DIET DYS 2 Room service appropriate?: Yes; Fluid consistency:: Thin.   aspirin 81 mg orally every day prior to admission, no antithrombotics due to [place].  Ongoing aggressive stroke risk factor management.   Therapy recommendations:  CIR.   Disposition:  CIR  Bradycardia w/ asystole  Transient HR 20s w/ asystole x 2 first night  HR stable overnight without further episodes  Elevated Troponins- mild likely of primary neurogenic etiology- patient denies chest pain  2D echo EF 65-70%   ? Seizure   EEG showed frequent right frontal sharps  Concerning second episode of unresponsiveness is due to seizure  Put on Keppra 500 mg twice a day  Repeat EEG showed bilateral triphasic waves consistent with encephalopathy  Afib - new diagnosis  Showing up  in tele  Confirmed on EKG  Not candidate for anticoagulation now.  Hypertension  Home meds:   Cozaar, labetalol  labetalol and HCTZ discontinued  Continue cozaar  Close monitoring  BP much stable at goal  Other Stroke Risk Factors  Advanced age  ETOH use  Urinary Tract Infection, hospital acquired with Urinary Rentention  WBC 11.1->12.2->15.5 ->20.0->15.1-> 11.0  UA w/ bacteria TNTC. culture no growth day # 2  Foley put in 8/1 d/t 350cc retention   On Ceftin (changed from Rocephin per internal medicine) Day # 3/5   Appreciate internal medicine consult note  [facility] if medically stable.  Follow-up with Dr. Erlinda Hong in 2 months  Hospital day # 9  [place] for Pager information 03/28/2015 2:27 PM   I, the attending vascular neurologist, have personally obtained a history, examined the patient, evaluated laboratory data, individually viewed imaging studies and agree with radiology  interpretations. Together with the NP/PA, we formulated the assessment and plan of care which reflects our mutual decision.  I have made any additions or clarifications directly to the above note and agree with the findings and plan as currently documented.   81 year old female with history of hypertension admitted for right temporal ICH. Off 3% saline and transfer to floor. Hematoma was stable on repeat CT. However, developed lethargy, high WBC, repeat UA showed UTI, put on antibodies. EEG showed triphasic waves, consistent with encephalopathy. Initial EEG concerning for right hemisphere sharp waves, put on Keppra. Patient found to have A. fib, not a candidate for anticoagulation at this moment. Repeat CT showed expected evolution of left temporal hemorrhage. CXR repeat negative for pneumonia. Blood culture NGTD. Labs in am. Possible CIR tomorrow. rocephine changed to ceftin.    [name], MD PhD Stroke Neurology 03/28/2015 9:11 PM  To contact Stroke Continuity provider, please refer to [email or web address] After hours, contact General Neurology

## 2015-03-28 NOTE — Progress Notes (Signed)
[facility] PROGRESS NOTE  [name] WUJ:[id number] DOB: June 24, 1918 DOA: 03/19/2015 PCP: Garret Reddish, MD  Assessment/Plan:  Active Problems:   Cerebral parenchymal hemorrhage   Cytotoxic brain edema   Brain herniation   Brain bleed   Cerebral hemorrhage   ICH (intracerebral hemorrhage)   Seizures   UTI (urinary tract infection)  Urine culture negative.  Change rocephin to ceftin to complete 5 days total.   HPI/Subjective: No complaints. RN reports no new issues  Objective: Filed Vitals:   03/28/15 1322  BP: 127/56  Pulse: 93  Temp: 99.5 F (37.5 C)  Resp: 18    Intake/Output Summary (Last 24 hours) at 03/28/15 1434 Last data filed at 03/28/15 1300  Gross per 24 hour  Intake    660 ml  Output    325 ml  Net    335 ml   Filed Weights   03/19/15 1230  Weight: 54.885 kg (121 lb)    Exam:   General:  Asleep. arousable  HEENT: slightly dry MM  Cardiovascular: RRR  Respiratory: CTA without WRR  Abdomen: S, NT, nD  Ext: no CCE  Basic Metabolic Panel:  Recent Labs Lab 03/24/15 0442 03/24/15 1025 03/25/15 0425 03/26/15 0604 03/27/15 0733 03/28/15 0444  NA 133* 133* 135 133* 140 139  K 3.6  --  3.7 4.4 4.2 4.1  CL 95*  --  97* 96* 104 106  CO2 29  --  30 26 29 26   GLUCOSE 129*  --  117* 137* 136* 139*  BUN 27*  --  25* 26* 23* 23*  CREATININE 0.98  --  0.87 0.88 0.93 0.82  CALCIUM 8.1*  --  8.4* 8.5* 8.6* 8.1*  MG  --   --  1.9  --   --   --   PHOS  --   --  4.0  --   --   --    Liver Function Tests:  Recent Labs Lab 03/25/15 0425  AST 29  ALT 79*  ALKPHOS 48  BILITOT 1.2  PROT 5.4*  ALBUMIN 2.6*   No results for input(s): LIPASE, AMYLASE in the last 168 hours. No results for input(s): AMMONIA in the last 168 hours. CBC:  Recent Labs Lab 03/24/15 0442 03/25/15 0425 03/26/15 0604 03/27/15 0733 03/28/15 0444  WBC 12.2* 15.5* 20.0* 15.1* 11.0*  NEUTROABS  --  12.7*  --   --   --   HGB 12.0 11.6* 12.5 11.1*  10.2*  HCT 35.2* 33.5* 35.9* 33.0* 31.1*  MCV 90.5 90.8 89.8 90.9 91.5  PLT 173 196 239 279 269   Cardiac Enzymes:  Recent Labs Lab 03/24/15 0442  TROPONINI 0.04*   BNP (last 3 results) No results for input(s): BNP in the last 8760 hours.  ProBNP (last 3 results)  Recent Labs  07/25/14 0641  PROBNP 562.8*    CBG: No results for input(s): GLUCAP in the last 168 hours.  Recent Results (from the past 240 hour(s))  Urine culture     Status: None   Collection Time: 03/19/15  1:24 PM  Result Value Ref Range Status   Specimen Description URINE, RANDOM  Final   Special Requests NONE  Final   Culture NO GROWTH 1 DAY  Final   Report Status 03/20/2015 FINAL  Final  MRSA PCR Screening     Status: None   Collection Time: 03/19/15  6:07 PM  Result Value Ref Range Status   MRSA by PCR NEGATIVE NEGATIVE Final  Comment:        The GeneXpert MRSA Assay (FDA approved for NASAL specimens only), is one component of a comprehensive MRSA colonization surveillance program. It is not intended to diagnose MRSA infection nor to guide or monitor treatment for MRSA infections.   Clostridium Difficile by PCR (not at [facility])     Status: None   Collection Time: 03/26/15  7:22 AM  Result Value Ref Range Status   Toxigenic C Difficile by pcr NEGATIVE NEGATIVE Final  Urine culture     Status: None   Collection Time: 03/26/15 10:10 PM  Result Value Ref Range Status   Specimen Description URINE, CLEAN CATCH  Final   Special Requests NONE  Final   Culture NO GROWTH 2 DAYS  Final   Report Status 03/28/2015 FINAL  Final  Culture, blood (routine x 2)     Status: None (Preliminary result)   Collection Time: 03/27/15 12:08 PM  Result Value Ref Range Status   Specimen Description BLOOD RIGHT ANTECUBITAL  Final   Special Requests BOTTLES DRAWN AEROBIC AND ANAEROBIC 10CC  Final   Culture NO GROWTH 1 DAY  Final   Report Status PENDING  Incomplete  Culture, blood (routine x 2)     Status: None  (Preliminary result)   Collection Time: 03/27/15 12:15 PM  Result Value Ref Range Status   Specimen Description BLOOD LEFT ARM  Final   Special Requests BOTTLES DRAWN AEROBIC ONLY 1CC  Final   Culture NO GROWTH 1 DAY  Final   Report Status PENDING  Incomplete     Studies: Dg Chest 2 View  03/27/2015   CLINICAL DATA:  Pneumonia.  EXAM: CHEST  2 VIEW  COMPARISON:  03/25/2015.  FINDINGS: Mediastinum and hilar structures are normal. Heart size stable. Near complete clearing of bibasilar atelectasis and/or infiltrates. No pleural effusion pneumothorax.  IMPRESSION: Near complete clearing of bibasilar atelectasis and/or infiltrates.   Electronically Signed   By: Marcello Moores  Register   On: 03/27/2015 15:58   Ct Head Wo Contrast  03/27/2015   CLINICAL DATA:  Followup intra cerebral hemorrhage.  EXAM: CT HEAD WITHOUT CONTRAST  TECHNIQUE: Contiguous axial images were obtained from the base of the skull through the vertex without intravenous contrast.  COMPARISON:  03/24/2015  FINDINGS: Intraparenchymal hematoma in the right temporal lobe is no larger. The hematoma is becoming less dense and less distinct and precise comparative measurement cannot be done, but there is certainly no additional or hyper acute bleeding. The amount of vasogenic edema appears very similar. Mass effect with right-to-left shift of 5 mm appears the same. No evidence of ventricular trapping. No extra-axial penetration. Small meningioma along the right side of the falx is unchanged.  IMPRESSION: No new hemorrhage. No increase in mass effect. Right temporal hematoma undergoing expected evolutionary changes, becoming less dense and less distinct.   Electronically Signed   By: Nelson Chimes M.D.   On: 03/27/2015 16:08   Dg Chest Port 1 View  03/28/2015   CLINICAL DATA:  CHF.  Hypertension.  EXAM: PORTABLE CHEST - 1 VIEW  COMPARISON:  None.  FINDINGS: Mediastinum and hilar structures are normal. The lungs are clear. No pleural effusion or  pneumothorax. Cardiomegaly with normal pulmonary vascularity. Mitral annular calcification. No acute bony abnormality.  IMPRESSION: No acute cardiopulmonary disease.  Chest is stable from prior exam.   Electronically Signed   By: Marcello Moores  Register   On: 03/28/2015 12:35    Scheduled Meds: .  stroke: mapping our early  stages of recovery book   Does not apply Once  . antiseptic oral rinse  7 mL Mouth Rinse q12n4p  . cefTRIAXone (ROCEPHIN)  IV  1 g Intravenous Daily  . chlorhexidine  15 mL Mouth Rinse BID  . diclofenac sodium  2 g Topical QID  . heparin subcutaneous  5,000 Units Subcutaneous Q12H  . levETIRAcetam  500 mg Oral BID  . losartan  100 mg Oral Daily  . pantoprazole  40 mg Oral QHS  . senna-docusate  1 tablet Oral BID   Continuous Infusions: . sodium chloride 30 mL/hr at 03/28/15 1015    Time spent: 25 minutes  [place] [facility] [email or web address], password [facility] 03/28/2015, 2:34 PM  LOS: 9 days

## 2015-03-28 NOTE — Progress Notes (Addendum)
Speech Language Pathology Treatment: Dysphagia;Cognitive-Linquistic  Patient Details Name: [name] MRN: [id number] DOB: [birth year] Today's Date: 03/28/2015 Time: 2458-0998 SLP Time Calculation (min) (ACUTE ONLY): 22 min  Assessment / Plan / Recommendation Clinical Impression  Dysphagia treatment provided today for diet tolerance/ trials of advanced solids. Pt able to feed self today with no difficulties. Continues to need verbal cues for small sips of thin liquid at a time- initially took several sips at once and then appeared SOB but without overt s/s of aspiration. Pt coughed immediately following trial of regular solid. CXR from 8/3 now nearly completely clear. Pt also more alert and attentive to PO trials today. Recommend continuing dysphagia 2 diet, thin liquids, meds whole with puree, full supervision to cue small bites/ sips and check for oral residuals/ pocketing. Will continue to follow for diet tolerance/ readiness for advancement.  Cognitive-linguistic treatment provided today for orientation, attention to task, and simple problem solving. Pt oriented to self, place, and situation; still disoriented to time and asked when August 1st was. Pt showed increased sustained attention today but continues to have moments of "drifting off" while speaking- starts sentence and then stops midway. Pt appears to have some difficulties with word finding in those instances and some unusual phrases for responses- when repositioning in bed said repeatedly, "I don't know how I want it to be but I want to be even." Pt unable to recall date/ time at end of session and unable to find day of week on menu. Pt seems somewhat aware of cognitive deficits as she repeatedly stated feeling confused. Able to locate call bell. Will continue to follow for cognitive-linguistic treatment.    HPI Other Pertinent Information: [name] is a 81 year old female with a past medical history significant for HTN,  osteopenia, who was [facility] ED [facility]18/15 via EMS for evaluation due to patient being found this AM in a sleepy state, poorly responsive and not speaking. Imaging revealed large right temporal lobe parenchymal hemorrhage, 8 mm midline shift and diffuse small vessel disease.  Patient initially passed RN stroke swallow screen; however, due to Sarah Ann orders received for bedside swallow and speech-language evaluation.     Pertinent Vitals Pain Assessment: Faces Faces Pain Scale: Hurts a little bit Pain Location: neck Pain Intervention(s): Repositioned  SLP Plan  Continue with current plan of care    Recommendations Diet recommendations: Dysphagia 2 (fine chop);Thin liquid Liquids provided via: Cup;No straw Medication Administration: Whole meds with puree Supervision: Patient able to self feed;Full supervision/cueing for compensatory strategies Compensations: Slow rate;Small sips/bites;Check for pocketing Postural Changes and/or Swallow Maneuvers: Seated upright 90 degrees              Oral Care Recommendations: Oral care BID Follow up Recommendations: Inpatient Rehab;24 hour supervision/assistance Plan: Continue with current plan of care    [place], Tmya Wigington K, MA, CCC-SLP 03/28/2015, 10:16 AM [phone]

## 2015-03-28 NOTE — Progress Notes (Signed)
Physical Therapy Treatment Patient Details Name: [name] MRN: [id number] DOB: 1918-06-24 Today's Date: 03/28/2015    History of Present Illness [name] is a 81 year old female with a past medical history significant for HTN, osteopenia, who was brought to [facility] 03/18/15 via EMS for evaluation due to patient being found this AM in a sleepy state, poorly responsive and not speaking. Imaging revealed large right temporal lobe parenchymal hemorrhage, 8 mm midline shift and diffuse small vessel disease.7/31 with unresponsive episode ?seizure    PT Comments    Patient with noted improvements in arousal, ability to follow commands and engage in therapy this session. Patient was able to tolerate EOB activities for >10 minutes this session with improvements noted in balance and coordination. Patient tolerated OOB to chair transfer with assist and facilitation. Will continue to see and progress as tolerated.    Follow Up Recommendations  CIR     Equipment Recommendations   (TBA)    Recommendations for Other Services       Precautions / Restrictions Precautions Precautions: Fall Restrictions Weight Bearing Restrictions: No    Mobility  Bed Mobility Overal bed mobility: Needs Assistance Bed Mobility: Rolling;Sidelying to Sit Rolling: Mod assist Sidelying to sit: Mod assist       General bed mobility comments: patient cues for initiation of movement LEs to EOB, patient able to provide some assist to come to EOB with increased assist required for poer uup to sitting. improved sitting balance and attempts to scoot to EOb this session  Transfers Overall transfer level: Needs assistance Equipment used: 2 person hand held assist   Sit to Stand: +2 physical assistance;Mod assist Stand pivot transfers: Mod assist;+2 physical assistance       General transfer comment: cues for hand placement; assist to come forward and power up; tactile cues to advance/step LLE  towards chair. Faciliatation of upright postuer required.  Ambulation/Gait                 Stairs            Wheelchair Mobility    Modified Rankin (Stroke Patients Only) Modified Rankin (Stroke Patients Only) Pre-Morbid Rankin Score: No symptoms Modified Rankin: Severe disability     Balance   Sitting-balance support: Feet supported Sitting balance-Leahy Scale: Fair Sitting balance - Comments: able to sit EOB without physical assist this session     Standing balance-Leahy Scale: Poor                      Cognition Arousal/Alertness: Awake/alert Behavior During Therapy: Anxious;Flat affect Overall Cognitive Status: Impaired/Different from baseline Area of Impairment: Orientation;Attention;Memory;Following commands;Safety/judgement;Awareness;Problem solving Orientation Level: Disoriented to;Time Current Attention Level: Sustained Memory: Decreased short-term memory Following Commands: Follows one step commands consistently Safety/Judgement: Decreased awareness of deficits Awareness: Intellectual Problem Solving: Slow processing;Decreased initiation;Requires verbal cues;Requires tactile cues General Comments: improved arousal this session compared to previous session. Patient also more consistent with follow commands.    Exercises      General Comments        Pertinent Vitals/Pain Pain Assessment: Faces Faces Pain Scale: No hurt Pain Location: neck  Pain Descriptors / Indicators: Grimacing;Moaning Pain Intervention(s): Limited activity within patient's tolerance;Monitored during session;Repositioned    Home Living                      Prior Function            PT Goals (current  goals can now be found in the care plan section) Acute Rehab PT Goals Patient Stated Goal: To get better  PT Goal Formulation: With patient Time For Goal Achievement: 04/04/15 Potential to Achieve Goals: Fair Progress towards PT goals: Progressing  toward goals    Frequency  Min 3X/week    PT Plan Current plan remains appropriate    Co-evaluation             End of Session Equipment Utilized During Treatment: Gait belt Activity Tolerance: Patient limited by lethargy Patient left: in chair;with call bell/phone within reach     Time: 1008-1026 PT Time Calculation (min) (ACUTE ONLY): 18 min  Charges:  $Therapeutic Activity: 8-22 mins                    G CodesDuncan Dull Mar 30, 2015, 12:27 PM  Alben Deeds, [place] DPT  [phone]

## 2015-03-28 NOTE — Progress Notes (Signed)
OT Cancellation Note  Patient Details Name: [name] MRN: [id number] DOB: 22-Feb-1918   Cancelled Treatment:    Reason Eval/Treat Not Completed: Fatigue/lethargy limiting ability to participate  Pt unable to participate in treatment session due to difficulty maintaining arousal.  Pt arousing briefly to sternal rub and verbal command, but unable to maintain arousal greater than 2-3 seconds.  Spoke with RN who reports medication may be impacting her arousal as she appears to be more aroused in the AM.   [place], [facility] 03/28/2015, 3:31 PM

## 2015-03-29 ENCOUNTER — Inpatient Hospital Stay (HOSPITAL_COMMUNITY)
Admission: RE | Admit: 2015-03-29 | Discharge: 2015-04-17 | DRG: 057 | Disposition: A | Discharge status: Home Health | Payer: Medicare Other | Source: Intra-hospital | Attending: Physical Medicine & Rehabilitation | Admitting: Physical Medicine & Rehabilitation

## 2015-03-29 DIAGNOSIS — I619 Nontraumatic intracerebral hemorrhage, unspecified: Secondary | ICD-10-CM | POA: Diagnosis present

## 2015-03-29 DIAGNOSIS — Z888 Allergy status to other drugs, medicaments and biological substances status: Secondary | ICD-10-CM

## 2015-03-29 DIAGNOSIS — I10 Essential (primary) hypertension: Secondary | ICD-10-CM | POA: Diagnosis present

## 2015-03-29 DIAGNOSIS — Z6828 Body mass index (BMI) 28.0-28.9, adult: Secondary | ICD-10-CM

## 2015-03-29 DIAGNOSIS — E46 Unspecified protein-calorie malnutrition: Secondary | ICD-10-CM | POA: Diagnosis not present

## 2015-03-29 DIAGNOSIS — Z79899 Other long term (current) drug therapy: Secondary | ICD-10-CM

## 2015-03-29 DIAGNOSIS — Z8679 Personal history of other diseases of the circulatory system: Secondary | ICD-10-CM | POA: Diagnosis present

## 2015-03-29 DIAGNOSIS — I480 Paroxysmal atrial fibrillation: Secondary | ICD-10-CM | POA: Diagnosis not present

## 2015-03-29 DIAGNOSIS — I69154 Hemiplegia and hemiparesis following nontraumatic intracerebral hemorrhage affecting left non-dominant side: Secondary | ICD-10-CM | POA: Diagnosis not present

## 2015-03-29 DIAGNOSIS — M1711 Unilateral primary osteoarthritis, right knee: Secondary | ICD-10-CM | POA: Diagnosis not present

## 2015-03-29 DIAGNOSIS — E876 Hypokalemia: Secondary | ICD-10-CM | POA: Diagnosis not present

## 2015-03-29 DIAGNOSIS — R531 Weakness: Secondary | ICD-10-CM | POA: Diagnosis present

## 2015-03-29 DIAGNOSIS — I612 Nontraumatic intracerebral hemorrhage in hemisphere, unspecified: Secondary | ICD-10-CM | POA: Diagnosis not present

## 2015-03-29 DIAGNOSIS — Z7982 Long term (current) use of aspirin: Secondary | ICD-10-CM | POA: Diagnosis not present

## 2015-03-29 DIAGNOSIS — I481 Persistent atrial fibrillation: Secondary | ICD-10-CM

## 2015-03-29 DIAGNOSIS — H9190 Unspecified hearing loss, unspecified ear: Secondary | ICD-10-CM | POA: Diagnosis not present

## 2015-03-29 DIAGNOSIS — R1310 Dysphagia, unspecified: Secondary | ICD-10-CM | POA: Diagnosis not present

## 2015-03-29 DIAGNOSIS — M25561 Pain in right knee: Secondary | ICD-10-CM | POA: Diagnosis not present

## 2015-03-29 DIAGNOSIS — Z9849 Cataract extraction status, unspecified eye: Secondary | ICD-10-CM | POA: Diagnosis not present

## 2015-03-29 DIAGNOSIS — I611 Nontraumatic intracerebral hemorrhage in hemisphere, cortical: Secondary | ICD-10-CM | POA: Diagnosis not present

## 2015-03-29 DIAGNOSIS — S06350S Traumatic hemorrhage of left cerebrum without loss of consciousness, sequela: Secondary | ICD-10-CM | POA: Diagnosis not present

## 2015-03-29 DIAGNOSIS — M8580 Other specified disorders of bone density and structure, unspecified site: Secondary | ICD-10-CM | POA: Diagnosis present

## 2015-03-29 DIAGNOSIS — D72829 Elevated white blood cell count, unspecified: Secondary | ICD-10-CM

## 2015-03-29 DIAGNOSIS — I248 Other forms of acute ischemic heart disease: Secondary | ICD-10-CM | POA: Diagnosis present

## 2015-03-29 DIAGNOSIS — I618 Other nontraumatic intracerebral hemorrhage: Secondary | ICD-10-CM

## 2015-03-29 DIAGNOSIS — I482 Chronic atrial fibrillation, unspecified: Secondary | ICD-10-CM

## 2015-03-29 DIAGNOSIS — I610 Nontraumatic intracerebral hemorrhage in hemisphere, subcortical: Secondary | ICD-10-CM | POA: Diagnosis not present

## 2015-03-29 DIAGNOSIS — I4891 Unspecified atrial fibrillation: Secondary | ICD-10-CM | POA: Diagnosis not present

## 2015-03-29 DIAGNOSIS — K5900 Constipation, unspecified: Secondary | ICD-10-CM | POA: Diagnosis not present

## 2015-03-29 DIAGNOSIS — I160 Hypertensive urgency: Secondary | ICD-10-CM | POA: Diagnosis present

## 2015-03-29 DIAGNOSIS — I629 Nontraumatic intracranial hemorrhage, unspecified: Secondary | ICD-10-CM

## 2015-03-29 LAB — CBC
HEMATOCRIT: 30.8 % — AB (ref 36.0–46.0)
HEMATOCRIT: 31.5 % — AB (ref 36.0–46.0)
HEMOGLOBIN: 10.5 g/dL — AB (ref 12.0–15.0)
Hemoglobin: 10.1 g/dL — ABNORMAL LOW (ref 12.0–15.0)
MCH: 30 pg (ref 26.0–34.0)
MCH: 31.1 pg (ref 26.0–34.0)
MCHC: 32.8 g/dL (ref 30.0–36.0)
MCHC: 33.3 g/dL (ref 30.0–36.0)
MCV: 91.4 fL (ref 78.0–100.0)
MCV: 93.2 fL (ref 78.0–100.0)
Platelets: 262 10*3/uL (ref 150–400)
Platelets: 255 10*3/uL (ref 150–400)
RBC: 3.37 MIL/uL — AB (ref 3.87–5.11)
RBC: 3.38 MIL/uL — ABNORMAL LOW (ref 3.87–5.11)
RDW: 13.3 % (ref 11.5–15.5)
RDW: 13.2 % (ref 11.5–15.5)
WBC: 9.1 10*3/uL (ref 4.0–10.5)
WBC: 10.8 10*3/uL — AB (ref 4.0–10.5)

## 2015-03-29 LAB — BASIC METABOLIC PANEL
Anion gap: 9 (ref 5–15)
BUN: 18 mg/dL (ref 6–20)
CO2: 26 mmol/L (ref 22–32)
CREATININE: 0.74 mg/dL (ref 0.44–1.00)
Calcium: 8.1 mg/dL — ABNORMAL LOW (ref 8.9–10.3)
Chloride: 106 mmol/L (ref 101–111)
GFR calc Af Amer: >60 mL/min (ref >60)
GFR calc non Af Amer: >60 mL/min (ref >60)
Glucose, Bld: 119 mg/dL — ABNORMAL HIGH (ref 65–99)
POTASSIUM: 3.6 mmol/L (ref 3.5–5.1)
SODIUM: 141 mmol/L (ref 135–145)

## 2015-03-29 LAB — CREATININE, SERUM
CREATININE: 0.72 mg/dL (ref 0.44–1.00)
GFR calc Af Amer: >60 mL/min (ref >60)
GFR calc non Af Amer: >60 mL/min (ref >60)

## 2015-03-29 MED ORDER — ACETAMINOPHEN 325 MG PO TABS
650.0000 mg | ORAL_TABLET | ORAL | Status: DC | PRN
  Administered 2015-03-31 – 2015-04-10 (×4): 650 mg via ORAL
  Filled 2015-03-29 (×6): qty 2

## 2015-03-29 MED ORDER — SENNOSIDES-DOCUSATE SODIUM 8.6-50 MG PO TABS
1.0000 | ORAL_TABLET | Freq: Two times a day (BID) | ORAL | Status: DC
  Administered 2015-03-29 – 2015-04-14 (×23): 1 via ORAL
  Filled 2015-03-29 (×24): qty 1

## 2015-03-29 MED ORDER — ONDANSETRON HCL 4 MG/2ML IJ SOLN: 4.0000 mg | Freq: Four times a day (QID) | INTRAMUSCULAR | Status: DC | PRN

## 2015-03-29 MED ORDER — ENSURE ENLIVE PO LIQD: 237.0000 mL | Freq: Two times a day (BID) | ORAL | Status: DC | PRN

## 2015-03-29 MED ORDER — HEPARIN SODIUM (PORCINE) 5000 UNIT/ML IJ SOLN
5000.0000 [IU] | Freq: Three times a day (TID) | INTRAMUSCULAR | Status: DC
  Administered 2015-03-29 – 2015-04-17 (×56): 5000 [IU] via SUBCUTANEOUS
  Filled 2015-03-29 (×56): qty 1

## 2015-03-29 MED ORDER — LEVETIRACETAM 500 MG PO TABS
500.0000 mg | ORAL_TABLET | Freq: Two times a day (BID) | ORAL | Status: DC
  Administered 2015-03-29 – 2015-04-01 (×6): 500 mg via ORAL
  Filled 2015-03-29 (×6): qty 1

## 2015-03-29 MED ORDER — PANTOPRAZOLE SODIUM 40 MG PO TBEC
40.0000 mg | DELAYED_RELEASE_TABLET | Freq: Every day | ORAL | Status: DC
  Administered 2015-03-29 – 2015-04-16 (×19): 40 mg via ORAL
  Filled 2015-03-29 (×19): qty 1

## 2015-03-29 MED ORDER — LOSARTAN POTASSIUM 50 MG PO TABS
100.0000 mg | ORAL_TABLET | Freq: Every day | ORAL | Status: DC
  Administered 2015-03-30 – 2015-04-17 (×19): 100 mg via ORAL
  Filled 2015-03-29 (×20): qty 2

## 2015-03-29 MED ORDER — POLYETHYLENE GLYCOL 3350 17 G PO PACK: 17.0000 g | PACK | Freq: Every day | ORAL | Status: DC | PRN

## 2015-03-29 MED ORDER — CHLORHEXIDINE GLUCONATE 0.12 % MT SOLN
15.0000 mL | Freq: Two times a day (BID) | OROMUCOSAL | Status: DC
  Administered 2015-03-29 – 2015-04-16 (×32): 15 mL via OROMUCOSAL
  Filled 2015-03-29 (×33): qty 15

## 2015-03-29 MED ORDER — SORBITOL 70 % SOLN: 30.0000 mL | Freq: Every day | Status: DC | PRN

## 2015-03-29 MED ORDER — HEPARIN SODIUM (PORCINE) 5000 UNIT/ML IJ SOLN: 5000.0000 [IU] | Freq: Two times a day (BID) | INTRAMUSCULAR | Status: DC

## 2015-03-29 MED ORDER — CETYLPYRIDINIUM CHLORIDE 0.05 % MT LIQD
7.0000 mL | Freq: Two times a day (BID) | OROMUCOSAL | Status: DC
  Administered 2015-03-30 – 2015-04-16 (×26): 7 mL via OROMUCOSAL

## 2015-03-29 MED ORDER — ONDANSETRON HCL 4 MG PO TABS
4.0000 mg | ORAL_TABLET | Freq: Four times a day (QID) | ORAL | Status: DC | PRN
Start: 2015-03-29 — End: 2015-04-17

## 2015-03-29 MED ORDER — DICLOFENAC SODIUM 1 % TD GEL
2.0000 g | Freq: Four times a day (QID) | TRANSDERMAL | Status: DC
  Administered 2015-03-29 – 2015-04-16 (×70): 2 g via TOPICAL
  Filled 2015-03-29 (×2): qty 100

## 2015-03-29 NOTE — Progress Notes (Signed)
Patient's daughter notified of patient's transfer to rehab. [phone].   Ave Filter, RN

## 2015-03-29 NOTE — Progress Notes (Signed)
Occupational Therapy Treatment Patient Details Name: [name] MRN: [id number] DOB: 08/21/1918 Today's Date: 03/29/2015    History of present illness KESLYN [name] is a 81 year old female with a past medical history significant for HTN, osteopenia, who was brought to [facility] ED 03/18/15 via EMS for evaluation due to patient being found in a sleepy state, poorly responsive and not speaking. Imaging revealed large right temporal lobe parenchymal hemorrhage, 8 mm midline shift and diffuse small vessel disease.7/31 with unresponsive episode ?seizure   OT comments  Pt with noted improvements in arousal, ability to follow commands and engage in therapy this session.  Pt easily aroused and engaged in seated task at edge of chair.  Pt required increased time to allow for initiation with repositioning in chair and engaging in seated task.  Engaged in sit > stand x2 with mod assist of 1 caregiver with allowing increased time for initiation.  Noted some perseveration with speech vs decreased STM when asking about rehab plan and would jump back to previous lines when reading card.  Pt left repositioned in recliner to promote increased arousal and overall positioning.  Plan for CIR remains appropriate as she continues to demonstrate increased arousal.   Follow Up Recommendations  CIR;Supervision/Assistance - 24 hour    Equipment Recommendations  None recommended by OT (TBD at next venue of care)    Recommendations for Other Services      Precautions / Restrictions Precautions Precautions: Fall Precaution Comments: Seizure precautions Restrictions Weight Bearing Restrictions: No          Balance   Sitting-balance support: Feet supported   Sitting balance - Comments: able to sit at edge of chair without physical assist this session.                             Cognition   Behavior During Therapy: Anxious;Flat affect Overall Cognitive Status: Impaired/Different from  baseline Area of Impairment: Orientation;Attention;Memory;Following commands;Safety/judgement;Awareness;Problem solving Orientation Level: Disoriented to;Time Current Attention Level: Sustained Memory: Decreased short-term memory  Following Commands: Follows one step commands consistently Safety/Judgement: Decreased awareness of deficits Awareness: Intellectual Problem Solving: Slow processing;Decreased initiation;Requires verbal cues;Requires tactile cues General Comments: Pt maintained arousal for entire session.  Noted some perseveration with speech and reading this session.  Pt asking about going to rehab.                   Frequency Min 2X/week     Progress Toward Goals  OT Goals(current goals can now be found in the care plan section)  Progress towards OT goals: Progressing toward goals     Plan Discharge plan remains appropriate       End of Session     Activity Tolerance Patient tolerated treatment well   Patient Left in chair;with call bell/phone within reach;with chair alarm set           Time: 2330-0762 OT Time Calculation (min): 20 min  Charges: OT General Charges $OT Visit: 1 Procedure OT Treatments $Therapeutic Activity: 8-22 mins  Simonne Come, [place] 03/29/2015, 12:26 PM

## 2015-03-29 NOTE — Progress Notes (Signed)
Report given to [place] from Rehab. All belongings sent.   Ave Filter, RN

## 2015-03-29 NOTE — Progress Notes (Signed)
Pt transferred to [facility] from [facility]. Alert and orientated x 3 with mild memory deficit noted. Granddaughter at bedside during orientation process.  Pt and family orientated to rehab expectations, therapy schedule, visiting hours, etc. Therapy to begin in the a.m.

## 2015-03-29 NOTE — H&P (Signed)
Expand All Collapse All      Physical Medicine and Rehabilitation Admission H&P   Chief Complaint  Patient presents with  . Weakness  . Fatigue  : HPI: [name] is a 81 y.o. right handed female with history of hypertension. Independent prior to admission without assistive device and still driving. Presented 03/19/2015 with altered mental status left-sided weakness and slurred speech. Blood pressure in the ED was 140/80. CT of the head and imaging showed a large right temporal intraparenchymal hematoma. Right to left midline shift of 6 mm. Troponin 0.4 felt to be related to demand ischemia and no other work up indicated. Neurosurgery consulted and advised conservative care.Keppra for seizure prophylaxis. Follow-up CT of the brain stable without hydrocephalus. MRA of the head with diffuse small vessel disease. EEG completed consistent with encephalopathy and no seizure activity recorded. Echocardiogram with ejection fraction of 70% no wall motion abnormalities. Subcutaneous heparin for DVT prophylaxis initiated 03/25/2015. Follow-up cranial CT scan 03/27/2015 shows no new hemorrhage or mass effect. Currently maintained on the dysphagia #2 thin liquids.. Bouts of hypokalemia resolved with potassium supplement. Initially placed on Rocephin for suspected UTI urine study showed no growth. Physical and occupational therapy evaluation completed 03/21/2015 with recommendations of physical medicine rehabilitation consult. Patient was admitted for a comprehensive rehabilitation program  ROS Review of Systems  Constitutional: Negative for fever and chills.  HENT: Positive for hearing loss.  Eyes: Negative for blurred vision and double vision.  Respiratory: Negative for cough and shortness of breath.  Cardiovascular: Negative for chest pain, palpitations and leg swelling.  Gastrointestinal: Positive for constipation. Negative for heartburn and nausea.  Genitourinary: Negative for  dysuria and urgency.  Musculoskeletal: Positive for myalgias and joint pain.  Skin: Negative for rash.  Neurological: Positive for headaches. Negative for dizziness and tingling   Past Medical History  Diagnosis Date  . Hypertension   . Osteopenia    Past Surgical History  Procedure Laterality Date  . Cataract extraction     Family History  Problem Relation Age of Onset  . Cancer Daughter     breast   Social History:  reports that she has never smoked. She has never used smokeless tobacco. She reports that she drinks about 0.6 oz of alcohol per week. She reports that she does not use illicit drugs. Allergies:  Allergies  Allergen Reactions  . Ace Inhibitors     REACTION: angioedema   Medications Prior to Admission  Medication Sig Dispense Refill  . aspirin EC 81 MG tablet Take 81 mg by mouth daily.    Marland Kitchen labetalol (NORMODYNE) 100 MG tablet TAKE 1 TABLET BY MOUTH TWICE A DAY 60 tablet 3  . losartan (COZAAR) 100 MG tablet Take 1 tablet (100 mg total) by mouth daily. 30 tablet 5  . Multiple Vitamin (MULTIVITAMIN) tablet Take 1 tablet by mouth daily.       Home: Home Living Family/patient expects to be discharged to:: Private residence Living Arrangements: Children Available Help at Discharge: Family (unsure) Type of Home: House Home Access: Stairs to enter Home Layout: Two level, Able to live on main level with bedroom/bathroom Alternate Level Stairs-Number of Steps: flight - pt has stair lift  Bathroom Shower/Tub: Multimedia programmer: Handicapped height Bathroom Accessibility: Yes Home Equipment: [employer] - built in, [employer] - tub/shower, Grab bars - toilet Additional Comments: Pt and spouse live with her daughter and son in [place]. They have an apartment in the basement of the house. However, they  have to walk around to the back of the house to access the door to their apartment. Or,  they can enter in the front door and has stair lift to basement apartment   Functional History: Prior Function Level of Independence: Independent Comments: Pt reports she was independent with ADLs. Occasionally sat to shower, but often stands. Does not use AD for ambulation. Spouse is currently in SNF rehab   Functional Status:  Mobility: Bed Mobility Overal bed mobility: Needs Assistance Bed Mobility: Supine to Sit Supine to sit: Min assist Sit to supine: Min assist General bed mobility comments: pt initiated swinging legs off the bed and coming up on R elbow. need minor trunc stability Transfers Overall transfer level: Needs assistance Equipment used: 2 person hand held assist Transfers: Sit to/from Stand Sit to Stand: Min guard, +2 physical assistance Stand pivot transfers: Min assist, +2 physical assistance General transfer comment: cues for hand placement; assist to come forward and power up. Ambulation/Gait Ambulation/Gait assistance: Min assist Ambulation Distance (Feet): 10 Feet (14 feet to recliner) Assistive device: Rolling walker (2 wheeled) Gait Pattern/deviations: Decreased step length - right, Decreased step length - left, Step-through pattern General Gait Details: unsteady, tentative steps Gait velocity: very slow and tentative    ADL: ADL Overall ADL's : Needs assistance/impaired Eating/Feeding: Minimal assistance, Moderate assistance, Sitting Eating/Feeding Details (indicate cue type and reason): min A to drink from cup. Mod A to manage utensils  Grooming: Wash/dry hands, Wash/dry face, Minimal assistance, Sitting Upper Body Bathing: Maximal assistance, Sitting Lower Body Bathing: Maximal assistance, Sit to/from stand Upper Body Dressing : Maximal assistance, Sitting Lower Body Dressing: Total assistance, Sit to/from stand Toilet Transfer: Minimal assistance, Ambulation, BSC Toileting- Clothing Manipulation and Hygiene: Maximal assistance, Sit  to/from stand Functional mobility during ADLs: +2 for physical assistance, Minimal assistance  Cognition: Cognition Overall Cognitive Status: Impaired/Different from baseline Arousal/Alertness: Awake/alert Orientation Level: Oriented to person, Oriented to place, Oriented to situation Attention: Sustained Sustained Attention: Appears intact Memory: Impaired Memory Impairment: Decreased recall of new information Awareness: Appears intact Problem Solving: Appears intact (with basic) Safety/Judgment: Appears intact Cognition Arousal/Alertness: Awake/alert Behavior During Therapy: WFL for tasks assessed/performed Overall Cognitive Status: Impaired/Different from baseline Area of Impairment: Orientation, Attention, Memory, Following commands, Safety/judgement, Awareness, Problem solving Orientation Level: Disoriented to, Place, Time, Situation Current Attention Level: Sustained Memory: Decreased short-term memory Following Commands: Follows one step commands consistently Safety/Judgement: Decreased awareness of deficits Problem Solving: Slow processing General Comments: Pt thinks that is it nighttime, is unaware of why she is in hospital and which hospital she is in. She is very slow to process information   Physical Exam: Blood pressure 95/52, pulse 84, temperature 98.3 F (36.8 C), temperature source Oral, resp. rate 28, height $RemoveBe'4\' 11"'[id number]$  (1.499 m), weight 54.885 kg (121 lb), SpO2 96 %. Physical Exam Constitutional: She is oriented to person, place, and time. She appears well-developed.  HENT: oral mucosa generally pink and moist, dentition fair Head: Normocephalic.  Eyes: EOM are normal.  Neck: Normal range of motion. Neck supple. No thyromegaly present.  Cardiovascular: Normal rate and irregular rhythm.  Respiratory: Effort normal and breath sounds normal. No respiratory distress.  GI: Soft. Bowel sounds are normal. She exhibits no distension.  Neurological: She is alert  and oriented to person, place, and time.  Makes good eye contact with examiner. Follows simple commands. occasionally needs redirection. Motor strength 4/5 bilateral deltoid, biceps, triceps, grip. Could be a trace weaker left upper (inconsistent) 2 minus in the right knee  extensor, 3 minus in the right hip flexor 4- ADF/APF 3 minus in the left hip flexor 4 minus in the knee extensor and 4- ADF/APF Sensation grossly appears intact to LT in both legs. Does sense pain.  Displays fair insight and awareness. Is able to answer basic biographical questions.  Skin: intact, a few bruises Psych: generally cooperative. A little anxious at times..    Lab Results Last 48 Hours    Results for orders placed or performed during the hospital encounter of 03/19/15 (from the past 48 hour(s))  Sodium Status: Abnormal   Collection Time: 03/23/15 10:30 AM  Result Value Ref Range   Sodium 133 (L) 135 - 145 mmol/L  Basic metabolic panel Status: Abnormal   Collection Time: 03/23/15 4:05 PM  Result Value Ref Range   Sodium 134 (L) 135 - 145 mmol/L   Potassium 3.6 3.5 - 5.1 mmol/L   Chloride 94 (L) 101 - 111 mmol/L   CO2 30 22 - 32 mmol/L   Glucose, Bld 137 (H) 65 - 99 mg/dL   BUN 18 6 - 20 mg/dL   Creatinine, Ser 1.08 (H) 0.44 - 1.00 mg/dL   Calcium 8.1 (L) 8.9 - 10.3 mg/dL   GFR calc non Af Amer 42 (L) >60 mL/min   GFR calc Af Amer 49 (L) >60 mL/min    Comment: (NOTE) The eGFR has been calculated using the CKD EPI equation. This calculation has not been validated in all clinical situations. eGFR's persistently <60 mL/min signify possible Chronic Kidney Disease.    Anion gap 10 5 - 15  Sodium Status: Abnormal   Collection Time: 03/23/15 10:29 PM  Result Value Ref Range   Sodium 133 (L) 135 - 145 mmol/L  Basic metabolic panel Status: Abnormal   Collection Time: 03/24/15 4:42 AM  Result Value Ref  Range   Sodium 133 (L) 135 - 145 mmol/L   Potassium 3.6 3.5 - 5.1 mmol/L   Chloride 95 (L) 101 - 111 mmol/L   CO2 29 22 - 32 mmol/L   Glucose, Bld 129 (H) 65 - 99 mg/dL   BUN 27 (H) 6 - 20 mg/dL   Creatinine, Ser 0.98 0.44 - 1.00 mg/dL   Calcium 8.1 (L) 8.9 - 10.3 mg/dL   GFR calc non Af Amer 48 (L) >60 mL/min   GFR calc Af Amer 55 (L) >60 mL/min    Comment: (NOTE) The eGFR has been calculated using the CKD EPI equation. This calculation has not been validated in all clinical situations. eGFR's persistently <60 mL/min signify possible Chronic Kidney Disease.    Anion gap 9 5 - 15  CBC Status: Abnormal   Collection Time: 03/24/15 4:42 AM  Result Value Ref Range   WBC 12.2 (H) 4.0 - 10.5 K/uL   RBC 3.89 3.87 - 5.11 MIL/uL   Hemoglobin 12.0 12.0 - 15.0 g/dL   HCT 35.2 (L) 36.0 - 46.0 %   MCV 90.5 78.0 - 100.0 fL   MCH 30.8 26.0 - 34.0 pg   MCHC 34.1 30.0 - 36.0 g/dL   RDW 12.7 11.5 - 15.5 %   Platelets 173 150 - 400 K/uL  Troponin I Status: Abnormal   Collection Time: 03/24/15 4:42 AM  Result Value Ref Range   Troponin I 0.04 (H) <0.031 ng/mL    Comment:   PERSISTENTLY INCREASED TROPONIN VALUES IN THE RANGE OF 0.04-0.49 ng/mL CAN BE SEEN IN:  -UNSTABLE ANGINA  -CONGESTIVE HEART FAILURE  -MYOCARDITIS  -CHEST TRAUMA  -ARRYHTHMIAS  -  LATE PRESENTING MYOCARDIAL INFARCTION  -COPD  CLINICAL FOLLOW-UP RECOMMENDED.   Sodium Status: Abnormal   Collection Time: 03/24/15 10:25 AM  Result Value Ref Range   Sodium 133 (L) 135 - 145 mmol/L  CBC with Differential/Platelet Status: Abnormal   Collection Time: 03/25/15 4:25 AM  Result Value Ref Range   WBC 15.5 (H) 4.0 - 10.5 K/uL   RBC 3.69 (L) 3.87 - 5.11 MIL/uL   Hemoglobin 11.6 (L) 12.0 - 15.0 g/dL   HCT 33.5 (L) 36.0 - 46.0 %    MCV 90.8 78.0 - 100.0 fL   MCH 31.4 26.0 - 34.0 pg   MCHC 34.6 30.0 - 36.0 g/dL   RDW 12.8 11.5 - 15.5 %   Platelets 196 150 - 400 K/uL   Neutrophils Relative % 82 (H) 43 - 77 %   Neutro Abs 12.7 (H) 1.7 - 7.7 K/uL   Lymphocytes Relative 9 (L) 12 - 46 %   Lymphs Abs 1.4 0.7 - 4.0 K/uL   Monocytes Relative 9 3 - 12 %   Monocytes Absolute 1.4 (H) 0.1 - 1.0 K/uL   Eosinophils Relative 1 0 - 5 %   Eosinophils Absolute 0.1 0.0 - 0.7 K/uL   Basophils Relative 0 0 - 1 %   Basophils Absolute 0.0 0.0 - 0.1 K/uL  Comprehensive metabolic panel Status: Abnormal   Collection Time: 03/25/15 4:25 AM  Result Value Ref Range   Sodium 135 135 - 145 mmol/L   Potassium 3.7 3.5 - 5.1 mmol/L   Chloride 97 (L) 101 - 111 mmol/L   CO2 30 22 - 32 mmol/L   Glucose, Bld 117 (H) 65 - 99 mg/dL   BUN 25 (H) 6 - 20 mg/dL   Creatinine, Ser 0.87 0.44 - 1.00 mg/dL   Calcium 8.4 (L) 8.9 - 10.3 mg/dL   Total Protein 5.4 (L) 6.5 - 8.1 g/dL   Albumin 2.6 (L) 3.5 - 5.0 g/dL   AST 29 15 - 41 U/L   ALT 79 (H) 14 - 54 U/L   Alkaline Phosphatase 48 38 - 126 U/L   Total Bilirubin 1.2 0.3 - 1.2 mg/dL   GFR calc non Af Amer 55 (L) >60 mL/min   GFR calc Af Amer >60 >60 mL/min    Comment: (NOTE) The eGFR has been calculated using the CKD EPI equation. This calculation has not been validated in all clinical situations. eGFR's persistently <60 mL/min signify possible Chronic Kidney Disease.    Anion gap 8 5 - 15  Magnesium Status: None   Collection Time: 03/25/15 4:25 AM  Result Value Ref Range   Magnesium 1.9 1.7 - 2.4 mg/dL  Phosphorus Status: None   Collection Time: 03/25/15 4:25 AM  Result Value Ref Range   Phosphorus 4.0 2.5 - 4.6 mg/dL  Lipid panel Status: Abnormal   Collection Time: 03/25/15 4:25 AM  Result Value Ref Range    Cholesterol 132 0 - 200 mg/dL   Triglycerides 95 <150 mg/dL   HDL 38 (L) >40 mg/dL   Total CHOL/HDL Ratio 3.5 RATIO   VLDL 19 0 - 40 mg/dL   LDL Cholesterol 75 0 - 99 mg/dL    Comment:   Total Cholesterol/HDL:CHD Risk Coronary Heart Disease Risk Table  Men Women 1/2 Average Risk 3.4 3.3 Average Risk 5.0 4.4 2 X Average Risk 9.6 7.1 3 X Average Risk 23.4 11.0   Use the calculated Patient Ratio above and the CHD Risk Table to determine the patient's CHD Risk.  ATP III CLASSIFICATION (LDL): <100 mg/dL Optimal 100-129 mg/dL Near or Above  Optimal 130-159 mg/dL Borderline 160-189 mg/dL High >190 mg/dL Very High       Imaging Results (Last 48 hours)    Ct Head Wo Contrast  03/24/2015 CLINICAL DATA: Changes in mental status with developing a aphasia. Follow-up right intracranial hemorrhage. EXAM: CT HEAD WITHOUT CONTRAST TECHNIQUE: Contiguous axial images were obtained from the base of the skull through the vertex without intravenous contrast. COMPARISON: 03/22/2015 and prior CTs FINDINGS: Unchanged right temporal intraparenchymal hemorrhage is identified maximally measuring 2.6 x 5.1 cm (image 10). Adjacent edema and 6 mm right to left midline shift are again noted. An unchanged focal 4 mm right anterior parafalcine subdural hematoma is again noted. Ventricular size is stable with intraventricular hemorrhage again noted. No new hemorrhage or acute infarction identified. Chronic small-vessel white matter ischemic changes and remote right basal ganglia infarcts new again identified. IMPRESSION: Unchanged exam with right temporal intraparenchymal hemorrhage, 6 mm right to left midline shift, focal 4 mm right anterior parafalcine subdural hematoma and intraventricular hemorrhage again noted. Electronically Signed  By: Margarette Canada M.D. On:  03/24/2015 07:56        Medical Problem List and Plan: 1. Functional deficits secondary to right temporal intraparenchymal hemorrhage secondary to hypertensive crisis 2. DVT Prophylaxis/Anticoagulation: Subcutaneous heparin initiated 03/25/2015. Monitor for a bleeding episodes 3. Pain Management: Voltaren gel 4 times a day to affected areas for OA. 4. Seizure prophylaxis. Keppra 500 mg twice a day. EEG negative for seizure 5. Neuropsych: This patient is capable of making decisions on her own behalf.s 6. Skin/Wound Care: Routine skin checks 7. Fluids/Electrolytes/Nutrition: Routine I&O follow-up chemistries 8. Hypertension. Cozaar 100 mg daily. Monitor with increased mobility 9. Hypokalemia. Follow-up chemistries on admit 10. New bradycardia/ afib: need to monitor HR daily, particularly with increased exertion in therapy.      Post Admission Physician Evaluation: 1. Functional deficits secondary to right temporal intraparenchymal hemorrhage secondary to hypertensive crisis.. 2. Patient is admitted to receive collaborative, interdisciplinary care between the physiatrist, rehab nursing staff, and therapy team. 3. Patient's level of medical complexity and substantial therapy needs in context of that medical necessity cannot be provided at a lesser intensity of care such as a SNF. 4. Patient has experienced substantial functional loss from his/her baseline which was documented above under the "Functional History" and "Functional Status" headings. Judging by the patient's diagnosis, physical exam, and functional history, the patient has potential for functional progress which will result in measurable gains while on inpatient rehab. These gains will be of substantial and practical use upon discharge in facilitating mobility and self-care at the household level. 5. Physiatrist will provide 24 hour management of medical needs as well as oversight of the therapy plan/treatment and provide  guidance as appropriate regarding the interaction of the two. 6. 24 hour rehab nursing will assist with bladder management, bowel management, safety, skin/wound care, disease management, medication administration, pain management and patient education and help integrate therapy concepts, techniques,education, etc. 7. PT will assess and treat for/with: Lower extremity strength, range of motion, stamina, balance, functional mobility, safety, adaptive techniques and equipment, NMR, caregiver education, stroke education, pain control. Goals are: supervision to mod I for household dx. 8. OT will assess and treat for/with: ADL's, functional mobility, safety, upper extremity strength, adaptive techniques and equipment, NMR, family ed, stroke education, ego support. Goals are: supervision to min assist. Therapy may proceed with showering this patient. 9. SLP will assess and treat for/with:  cognition, communication. Goals are: supervision. 10. Case Management and Social Worker will assess and treat for psychological issues and discharge planning. 11. Team conference will be held weekly to assess progress toward goals and to determine barriers to discharge. 12. Patient will receive at least 3 hours of therapy per day at least 5 days per week. 13. ELOS: 10-17 days  14. Prognosis: excellent     Meredith Staggers, MD, [facility] 03/29/2015

## 2015-03-29 NOTE — Progress Notes (Signed)
Rehab admissions - I met with patient and was in room with attending MD.  Patient now medically clear for acute inpatient rehab admission.  Bed available and will admit to acute inpatient rehab today.  Call me for questions.  #[phone]

## 2015-03-29 NOTE — Care Management Important Message (Signed)
Important Message  Patient Details  Name: [name] MRN: [id number] Date of Birth: [birth year]   Medicare Important Message Given:  Yes-third notification given    Delorse Lek 03/29/2015, 1:53 PM

## 2015-03-29 NOTE — Discharge Summary (Signed)
Stroke Discharge Summary  Patient ID: [name]   MRN: [id number]      DOB: Sep 16, 1917  Date of Admission: 79/26/2016 Date of Discharge: 03/29/2015  Attending Physician:  [name], MD, Stroke MD  Consulting Physician(s):    Rehabilitation medicine, internal medicine, and neurosurgery - Dr Letta Pate, Dr Olevia Bowens, and Dr Kathyrn Sheriff.  Patient's PCP:  Garret Reddish, MD  Discharge Diagnoses: Right temporal intraparenchymal hemorrhage Active Problems:   Cerebral parenchymal hemorrhage   Cytotoxic brain edema   Brain herniation   Brain bleed   Cerebral hemorrhage   ICH (intracerebral hemorrhage)   Seizures   UTI (urinary tract infection)   Altered mental status   Lethargy   Leukocytosis BMI  Body mass index is 24.43 kg/(m^2).  Past Medical History  Diagnosis Date  . Hypertension   . Osteopenia    Past Surgical History  Procedure Laterality Date  . Cataract extraction      Medications to be continued on Rehab .  stroke: mapping our early stages of recovery book   Does not apply Once  . antiseptic oral rinse  7 mL Mouth Rinse q12n4p  . cefUROXime  500 mg Oral BID WC  . chlorhexidine  15 mL Mouth Rinse BID  . diclofenac sodium  2 g Topical QID  . heparin subcutaneous  5,000 Units Subcutaneous Q12H  . levETIRAcetam  500 mg Oral BID  . losartan  100 mg Oral Daily  . pantoprazole  40 mg Oral QHS  . senna-docusate  1 tablet Oral BID    LABORATORY STUDIES CBC    Component Value Date/Time   WBC 9.1 03/29/2015 0530   WBC 10.1 03/01/2012 1310   RBC 3.37* 03/29/2015 0530   RBC 4.44 03/01/2012 1310   HGB 10.1* 03/29/2015 0530   HGB 13.7 03/01/2012 1310   HCT 30.8* 03/29/2015 0530   HCT 40.8 03/01/2012 1310   PLT 262 03/29/2015 0530   PLT 168 03/01/2012 1310   MCV 91.4 03/29/2015 0530   MCV 91.8 03/01/2012 1310   MCH 30.0 03/29/2015 0530   MCH 30.8 03/01/2012 1310   MCHC 32.8 03/29/2015 0530   MCHC 33.6 03/01/2012 1310   RDW 13.3 03/29/2015 0530   RDW 13.9  03/01/2012 1310   LYMPHSABS 1.4 03/25/2015 0425   LYMPHSABS 1.9 03/01/2012 1310   MONOABS 1.4* 03/25/2015 0425   MONOABS 0.7 03/01/2012 1310   EOSABS 0.1 03/25/2015 0425   EOSABS 0.1 03/01/2012 1310   BASOSABS 0.0 03/25/2015 0425   BASOSABS 0.1 03/01/2012 1310   CMP    Component Value Date/Time   NA 141 03/29/2015 0530   K 3.6 03/29/2015 0530   CL 106 03/29/2015 0530   CO2 26 03/29/2015 0530   GLUCOSE 119* 03/29/2015 0530   BUN 18 03/29/2015 0530   CREATININE 0.74 03/29/2015 0530   CALCIUM 8.1* 03/29/2015 0530   PROT 5.4* 03/25/2015 0425   ALBUMIN 2.6* 03/25/2015 0425   AST 29 03/25/2015 0425   ALT 79* 03/25/2015 0425   ALKPHOS 48 03/25/2015 0425   BILITOT 1.2 03/25/2015 0425   GFRNONAA >60 03/29/2015 0530   GFRAA >60 03/29/2015 0530   COAGS Lab Results  Component Value Date   INR 1.19 03/19/2015   Lipid Panel    Component Value Date/Time   CHOL 132 03/25/2015 0425   TRIG 95 03/25/2015 0425   HDL 38* 03/25/2015 0425   CHOLHDL 3.5 03/25/2015 0425   VLDL 19 03/25/2015 0425   LDLCALC 75 03/25/2015  0425   HgbA1C  Lab Results  Component Value Date   HGBA1C 5.8* 03/25/2015   Cardiac Panel (last 3 results) No results for input(s): CKTOTAL, CKMB, TROPONINI, RELINDX in the last 72 hours. Urinalysis    Component Value Date/Time   COLORURINE YELLOW 03/26/2015 0535   APPEARANCEUR TURBID* 03/26/2015 0535   LABSPEC 1.014 03/26/2015 0535   PHURINE 5.5 03/26/2015 0535   GLUCOSEU NEGATIVE 03/26/2015 0535   HGBUR LARGE* 03/26/2015 0535   BILIRUBINUR NEGATIVE 03/26/2015 0535   KETONESUR NEGATIVE 03/26/2015 0535   PROTEINUR NEGATIVE 03/26/2015 0535   UROBILINOGEN 0.2 03/26/2015 0535   NITRITE POSITIVE* 03/26/2015 0535   LEUKOCYTESUR LARGE* 03/26/2015 0535   Urine Drug Screen No results found for: LABOPIA, COCAINSCRNUR, LABBENZ, AMPHETMU, THCU, LABBARB  Alcohol Level No results found for: [facility]   SIGNIFICANT DIAGNOSTIC STUDIES  Ct Head Wo Contrast 03/24/15 -  Unchanged exam with right temporal intraparenchymal hemorrhage, 6 mm right to left midline shift, focal 4 mm right anterior parafalcine subdural hematoma and intraventricular hemorrhage again noted.  03/22/2015 : Expected evolution of multifocal intracranial hemorrhage without significant interval progression. The small subdural hemorrhage overlying the right temporal lobe is significantly less conspicuous on today's exam, the intraparenchymal hemorrhage within the right temporal lobe and small volume intraventricular hemorrhage layering within both occipital horns are essentially unchanged.  03/20/2015 1. No significant interval change in size of right temporal lobe intraparenchymal hemorrhage with slightly increased localized vasogenic edema. 2. No significant interval change in acute subdural hematoma overlying the right cerebral convexity measuring up to 5 mm in maximal thickness. Associated 6 mm of right-to-left shift is not significantly changed. 3. Small volume intraventricular hemorrhage, slightly increased from prior. Asymmetric dilatation of the left lateral ventricle is stable. No hydrocephalus. 4. No new intracranial process.   03/19/2015 Large intraparenchymal hemorrhage of the right temporal lobe with a 6 mm right frontal temporal acute extra-axial hemorrhage. There is right to left midline shift.   03/27/15 - No new hemorrhage. No increase in mass effect. Right temporal hematoma undergoing expected evolutionary changes, becoming less dense and less distinct.  Mr Brain Wo Contrast 03/20/2015 1. Stable size of large right temporal lobe parenchymal hemorrhage with layering blood products. 2. Similar appearance of diffuse vasogenic edema and 8 mm midline shift. 3. Stable small right extra-axial hemorrhage. 4. Blood products layering within the ventricles bilaterally without hydrocephalus. 5. Stable chronic small vessel T2 changes.   Mr Jodene Nam Head Wo Contrast 03/20/2015 6. The MRA  demonstrates displacement of the MCA branch vessels without a focal lesion to explain the hemorrhage. 7. Diffuse small vessel disease is present on the MRA.   Dg Chest Port 1 View 03/25/2015 1. Bilateral medial lung base opacity, mild, likely representing atelectasis although possibility of pneumonia not excluded and follow-up recommended. 2. Aortic atherosclerotic calcification 03/20/2015 Left internal jugular catheter is noted with distal tip in expected position the SVC. No acute cardiopulmonary abnormality seen.  03/28/2015 No acute cardiopulmonary disease. Chest is stable from prior exam.   2D echo - Left ventricle: The cavity size was normal. Wall thickness wasincreased in a pattern of mild LVH. There was focal basalhypertrophy. Systolic function was vigorous. The estimatedejection fraction was in the range of 65% to 70%. Wall motion was normal; there were no regional wall motion abnormalities. - Aortic valve: Moderately calcified annulus. - Mitral valve: Severely calcified annulus. There was mild regurgitation. Valve area by continuity equation (using LVOT flow): 1.47 cm^2. - Left atrium: The atrium was moderately dilated.  EEG  03/25/15 -  1) Frequent right frontal sharp waves Clinical Interpretation: This EEG is consistent with a region of potential epileptogenicity in the right frontal region). No seizure was recorded.  03/26/15  1) Triphasic waves 2) Generalized slow activity.  Clinical Interpretation: This EEG is consistent with an encephalopathy. Triphasic waves are associai]ted with many kinds of toxic/metabolic encephalopathies including infection, hepatic failure, renal failure, medication effect among other causes.      HISTORY OF PRESENT ILLNES Carolyn P Rigsby is a 81 y.o. female with a past medical history significant for HTN and osteopenia, brought to [facility] via EMS for evaluation of altered mental status, left hemiparesis, dysarthria. Daughter and son in  law were at the bedside and stated that they last saw her normal last night (LKW 03/18/2015 at 2100) and this morning 03/19/2015 they found her sitting in the bathroom acting sleepy, poorly responsive and not speaking". They said that at baseline she is very active and functional and this was a dramatic change for her and thus EMS was summoned. When EMS arrived they report that patient was having slurred speech and not moving the left side. BP was apparently not significantly elevated initially and as a matter of fact BP 140/80 in the ED. No recent head trauma. Patient is not taking anticoagulants but has been on baby aspirin for the last couple of months. CT brain performed in the ED was reviewed and showed a large intraparenchymal hemorrhage of the right temporal lobe with a 6 mm right frontal temporal acute extra-axial hemorrhage with associated right to left midline shift. Neurosurgery consulted by ED attending and I was informed that patient was not considered a surgical candidate. Serologies: platelet count 182,000. INR 1.19. In the ED, Mrs. Montemurro was drowsy but opened her eyes and followed commands. Said that she had a HA the whole day yesterday but denies nausea, vomiting, vertigo, double vision, or language difficulty. She was admitted to the neuro ICU for further evaluation and treatment on 03/19/2015.  HOSPITAL COURSE  Ms. KATHEY SIMER is a 81 y.o. female with history of hypertension presenting with altered mental status, left hemiparesis, dysarthria and admitted on 03/19/2015.  Encephalopathy  Etiology most likely UTI reaction, less likely possible seizure vs reaction to keppra. Doubt related to hemorrhage as CT head repeat showed hemorrhage evolution.   WBC 11.1->12.2->15.5 ->20.0->15.1 -> 11.0  UA positive for UTI - On Ceftin (changed from Rocephin per internal medicine) day # 4/5; CXR neg on 8/1 and 8/4.  EEG showed triphasic waves, consistent with encephalopathy  Continue NS @  30cc/h  Check blood cultures x 2 - no growth day 3  WBCs continue to trend down. BUN 23 no change in GFR 59 improving  No indication for step down at this time  Dr. Erlinda Hong discussed discussed diagnosis, prognosis, treatment options and plan of care with daughter in detail.  Stroke: large right temporal intraparenchymal hemorrhage with IVH and cerebral edema secondary to either hypertension or cerebral amyloid angiopathy source (given lobar location and relatively normotensive state on arrival lean toward amyloid)  Resultant Left mild hemiparesis  MRI R temporal lobe hmg w/ edema and 8 mm midline shift  MRA No large vessel occlusion, no source of hemorrhage  Repeat CT head today showed expected evolution of right temporal hemorrhage.  Heparin subcutaneous for VTE prophylaxis  DIET DYS 2 Room service appropriate?: Yes; Fluid consistency:: Thin.   aspirin 81 mg orally every day prior to admission, no antithrombotics due to [place].  Ongoing aggressive stroke risk factor management.   Therapy recommendations: CIR.  Disposition: CIR  Bradycardia w/ asystole  Transient HR 20s w/ asystole x 2 first night  HR stable overnight without further episodes  Elevated Troponins- mild likely of primary neurogenic etiology- patient denies chest pain  2D echo EF 65-70%  ? Seizure   EEG showed frequent right frontal sharps  Concerning second episode of unresponsiveness is due to seizure  Put on Keppra 500 mg twice a day  Repeat EEG showed bilateral triphasic waves consistent with encephalopathy  Afib - new diagnosis  Showing up in tele  Confirmed on EKG  Not candidate for anticoagulation now.  Hypertension  Home meds: Cozaar, labetalol  labetalol and HCTZ discontinued  Continue cozaar  Close monitoring  BP much stable at goal  Other Stroke Risk Factors  Advanced age  ETOH use  Urinary Tract Infection, hospital acquired with Urinary Rentention  WBC  11.1->12.2->15.5 ->20.0->15.1-> 11.0  UA w/ bacteria TNTC. culture no growth day # 2  Foley put in 8/1 d/t 350cc retention   On Ceftin (changed from Rocephin per internal medicine) Day # 4/5  Discharged in stable condition for admission to the inpatient rehabilitation center on 03/28/2005.  DISCHARGE EXAM Blood pressure 148/66, pulse 73, temperature 97.8 F (36.6 C), temperature source Oral, resp. rate 20, height 4\' 11"  (1.499 m), weight 54.885 kg (121 lb), SpO2 97 %.  General - Well nourished, well developed, mild lethargy.  Ophthalmologic - Fundi not visualized due to noncooperation.  Cardiovascular - irregularly irregular heart rate and rhythm.  Neuro - patient mildly lethargic, able to answer questions and follow commands. Blinking to visual threat bilaterally, PERRL, facial symmetrical, tongue in middle, 4/5 all extremities. Sensation symmetrical, DTR 1+, no Babinski. Coordination intact bilaterally. Gait not tested.   Discharge Diet  DIET DYS 2 Room service appropriate?: Yes; Fluid consistency:: Thin liquids  DISCHARGE PLAN  Disposition:  Transfer to [place] for ongoing PT, OT and ST  no antithrombotic for secondary stroke prevention secondary to right temporal intraparenchymal hemorrhage.  Recommend ongoing risk factor control by Primary Care Physician at time of discharge from inpatient rehabilitation.  Follow-up Garret Reddish, MD in 2 weeks following discharge from rehab.  Follow-up with Dr. [name] in 2 months.   35 minutes were spent preparing discharge.  Mikey Bussing PA-C [facility] Pager [phone] 03/29/2015, 4:50 PM  I, the attending vascular neurologist, have personally obtained a history, examined the patient, evaluated laboratory data, individually viewed imaging studies and agree with radiology interpretations.  Together with the NP/PA, we formulated the assessment and plan of care which reflects  our mutual decision.  I have made any additions or clarifications directly to the above note and agree with the findings and plan as currently documented.    [name], MD PhD Stroke Neurology 03/30/2015 12:58 AM

## 2015-03-29 NOTE — Progress Notes (Signed)
Carolyn Blake, MD Physician Signed Physical Medicine and Rehabilitation Consult Note 03/22/2015 10:31 AM  Related encounter: ED to Hosp-Admission (Current) from 03/19/2015 in [facility] Collapse All        Physical Medicine and Rehabilitation Consult Reason for Consult: Right temporal intraparenchymal hemorrhage Referring Physician: Dr.Sethi    HPI: [name] is a 81 y.o. right handed female with history of hypertension. Independent prior to admission without assistive device and still driving. Presented 03/19/2015 with altered mental status left-sided weakness and slurred speech. Blood pressure in the ED was 140/80. CT of the head and imaging showed a large right temporal intraparenchymal hematoma. Right to left midline shift of 6 mm. Troponin negative. Neurosurgery consulted and advised conservative care. Follow-up MRI of the brain stable without hydrocephalus. MRA of the head with diffuse small vessel disease. Maintain on a regular diet. Physical and occupational therapy evaluation completed 03/21/2015 with recommendations of physical medicine rehabilitation consult  Patient states she has food stuck in her mouth and needed a drink of water. She denies any coughing or choking on fluids or solids. Patient is sitting up in a chair she has no pain complaints Review of Systems  Constitutional: Negative for fever and chills.  HENT: Positive for hearing loss.  Eyes: Negative for blurred vision and double vision.  Respiratory: Negative for cough and shortness of breath.  Cardiovascular: Negative for chest pain, palpitations and leg swelling.  Gastrointestinal: Positive for constipation. Negative for heartburn and nausea.  Genitourinary: Negative for dysuria and urgency.  Musculoskeletal: Positive for myalgias and joint pain.  Skin: Negative for rash.  Neurological: Positive for headaches. Negative for dizziness and tingling.    Past Medical History  Diagnosis Date  . Hypertension   . Osteopenia    Past Surgical History  Procedure Laterality Date  . Cataract extraction     Family History  Problem Relation Age of Onset  . Cancer Daughter     breast   Social History:  reports that she has never smoked. She has never used smokeless tobacco. She reports that she drinks about 0.6 oz of alcohol per week. She reports that she does not use illicit drugs. Allergies:  Allergies  Allergen Reactions  . Ace Inhibitors     REACTION: angioedema   Medications Prior to Admission  Medication Sig Dispense Refill  . aspirin EC 81 MG tablet Take 81 mg by mouth daily.    Marland Kitchen labetalol (NORMODYNE) 100 MG tablet TAKE 1 TABLET BY MOUTH TWICE A DAY 60 tablet 3  . losartan (COZAAR) 100 MG tablet Take 1 tablet (100 mg total) by mouth daily. 30 tablet 5  . Multiple Vitamin (MULTIVITAMIN) tablet Take 1 tablet by mouth daily.       Home: Home Living Family/patient expects to be discharged to:: Private residence Living Arrangements: Children Available Help at Discharge: Family (unsure) Type of Home: House Home Access: Stairs to enter Home Layout: Two level, Able to live on main level with bedroom/bathroom Alternate Level Stairs-Number of Steps: flight - pt has stair lift  Bathroom Shower/Tub: Multimedia programmer: Handicapped height Bathroom Accessibility: Yes Home Equipment: [place] - built in, [employer] - tub/shower, Grab bars - toilet Additional Comments: Pt and spouse live with her daughter and son in [place]. They have an apartment in the basement of the house. However, they have to walk around to the back of the house to access the door to their  apartment. Or, they can enter in the front door and has stair lift to basement apartment   Functional History: Prior Function Level of Independence: Independent Comments: Pt reports she was  independent with ADLs. Occasionally sat to shower, but often stands. Does not use AD for ambulation. Spouse is currently in SNF rehab  Functional Status:  Mobility: Bed Mobility Overal bed mobility: Needs Assistance Bed Mobility: Supine to Sit, Sit to Supine Supine to sit: Min assist, +2 for physical assistance Sit to supine: Min assist General bed mobility comments: pt initiated assist with R UE, but needed truncal assist Transfers Overall transfer level: Needs assistance Equipment used: 2 person hand held assist Transfers: Sit to/from Stand Sit to Stand: Min assist, +2 physical assistance Stand pivot transfers: Min assist, +2 physical assistance General transfer comment: cues for hand placement; assist to come forward and power up. Ambulation/Gait Ambulation/Gait assistance: Min assist, +2 physical assistance Ambulation Distance (Feet): 3 Feet (forward and back with 2 person HHA) Assistive device: 2 person hand held assist Gait Pattern/deviations: Step-through pattern, Trunk flexed, Wide base of support General Gait Details: unsteady, tentative steps    ADL: ADL Overall ADL's : Needs assistance/impaired Eating/Feeding: Minimal assistance, Moderate assistance, Sitting Eating/Feeding Details (indicate cue type and reason): min A to drink from cup. Mod A to manage utensils  Grooming: Wash/dry hands, Wash/dry face, Minimal assistance, Sitting Upper Body Bathing: Maximal assistance, Sitting Lower Body Bathing: Maximal assistance, Sit to/from stand Upper Body Dressing : Maximal assistance, Sitting Lower Body Dressing: Total assistance, Sit to/from stand Toilet Transfer: Minimal assistance, Ambulation, BSC Toileting- Clothing Manipulation and Hygiene: Maximal assistance, Sit to/from stand Functional mobility during ADLs: +2 for physical assistance, Minimal assistance  Cognition: Cognition Overall Cognitive Status: Impaired/Different from baseline Arousal/Alertness:  Awake/alert Orientation Level: Oriented X4 Attention: Sustained Sustained Attention: Appears intact Memory: Impaired Memory Impairment: Decreased recall of new information Awareness: Appears intact Problem Solving: Appears intact (with basic) Safety/Judgment: Appears intact Cognition Arousal/Alertness: Awake/alert Behavior During Therapy: WFL for tasks assessed/performed Overall Cognitive Status: Impaired/Different from baseline Area of Impairment: Orientation, Attention, Memory, Following commands, Safety/judgement, Awareness, Problem solving Orientation Level: Disoriented to, Place, Time, Situation Current Attention Level: Sustained Memory: Decreased short-term memory Following Commands: Follows one step commands consistently Safety/Judgement: Decreased awareness of deficits Problem Solving: Slow processing, Decreased initiation, Difficulty sequencing, Requires verbal cues, Requires tactile cues General Comments: Pt thinks that is it nighttime, is unaware of why she is in hospital and which hospital she is in. She is very slow to process information   Blood pressure 123/58, pulse 75, temperature 98.8 F (37.1 C), temperature source Oral, resp. rate 25, height 4\' 11"  (1.499 m), weight 54.885 kg (121 lb), SpO2 96 %. Physical Exam  Constitutional: She is oriented to person, place, and time. She appears well-developed.  HENT:  Head: Normocephalic.  Eyes: EOM are normal.  Neck: Normal range of motion. Neck supple. No thyromegaly present.  Cardiovascular: Normal rate and regular rhythm.  Respiratory: Effort normal and breath sounds normal. No respiratory distress.  GI: Soft. Bowel sounds are normal. She exhibits no distension.  Neurological: She is alert and oriented to person, place, and time.  Makes good eye contact with examiner. Follows simple commands.  Skin: Skin is warm and dry.  Patient follows simple commands during manual muscle testing however some repetition is needed.  Question hard of hearing Motor strength 4/5 bilateral deltoid, biceps, triceps, grip 2 minus in the right knee extensor, 3 minus in the right hip flexor 4 minus and  ankle dorsiflexor 3 minus in the left hip flexor 4 minus in the knee extensor and 4 minus in the ankle dorsiflexor Sensation difficult to assess secondary to her tension she needs repeated cueing. She was able to sense light touch on the toes of the right foot but did not appear to have intact sensation to light touch in the left foot. Upper extremity sensation appeared normal   Lab Results Last 24 Hours    Results for orders placed or performed during the hospital encounter of 03/19/15 (from the past 24 hour(s))  Troponin I (q 6hr x 3) Status: Abnormal   Collection Time: 03/21/15 12:30 PM  Result Value Ref Range   Troponin I 0.09 (H) <0.031 ng/mL  Sodium Status: None   Collection Time: 03/21/15 12:30 PM  Result Value Ref Range   Sodium 145 135 - 145 mmol/L  Sodium Status: None   Collection Time: 03/21/15 9:50 PM  Result Value Ref Range   Sodium 143 135 - 145 mmol/L  CBC Status: Abnormal   Collection Time: 03/22/15 4:00 AM  Result Value Ref Range   WBC 11.5 (H) 4.0 - 10.5 K/uL   RBC 3.69 (L) 3.87 - 5.11 MIL/uL   Hemoglobin 11.4 (L) 12.0 - 15.0 g/dL   HCT 34.2 (L) 36.0 - 46.0 %   MCV 92.7 78.0 - 100.0 fL   MCH 30.9 26.0 - 34.0 pg   MCHC 33.3 30.0 - 36.0 g/dL   RDW 13.1 11.5 - 15.5 %   Platelets 182 150 - 400 K/uL  Basic metabolic panel Status: Abnormal   Collection Time: 03/22/15 4:00 AM  Result Value Ref Range   Sodium 142 135 - 145 mmol/L   Potassium 2.3 (LL) 3.5 - 5.1 mmol/L   Chloride 105 101 - 111 mmol/L   CO2 29 22 - 32 mmol/L   Glucose, Bld 148 (H) 65 - 99 mg/dL   BUN 12 6 - 20 mg/dL   Creatinine, Ser 0.80 0.44 - 1.00 mg/dL   Calcium 7.6 (L) 8.9 - 10.3 mg/dL   GFR calc  non Af Amer >60 >60 mL/min   GFR calc Af Amer >60 >60 mL/min   Anion gap 8 5 - 15      Imaging Results (Last 48 hours)    Ct Head Wo Contrast  03/22/2015 CLINICAL DATA: 81 year old female with intracranial hemorrhage. EXAM: CT HEAD WITHOUT CONTRAST TECHNIQUE: Contiguous axial images were obtained from the base of the skull through the vertex without intravenous contrast. COMPARISON: Most recent prior head CT 03/20/2015; brain MRI 03/20/2015 FINDINGS: Right temporal lobe intraparenchymal hemorrhage is again noted. Measuring along similar planes compared to the prior study demonstrates no significant interval change in the size of the hemorrhage. On image 8 of the hemorrhage measures 5.7 x 2.5 cm which is insignificantly changed compared to 5.9 x 2.3 cm previously. On image 9, the hemorrhage measures 5.1 x 2.6 cm compared to 5.0 x 2.6 cm. There is a similar degree of surrounding edema. Small volume intraventricular hemorrhage a layering within both occipital horns remains unchanged. Unchanged 6 mm of right to left midline shift. Small meningioma versus subdural hemorrhage on the right of the falx anteriorly. Similar effacement of the right perimesencephalic cisterns from a local mass effect. Stable remote right basal ganglia lacunar infarct. Stable ventricular configuration. Small right subdural hemorrhage overlying the right convexity is significantly less conspicuous on today's examination. IMPRESSION: 1. Expected evolution of multifocal intracranial hemorrhage without significant interval progression. The small subdural hemorrhage overlying  the right temporal lobe is significantly less conspicuous on today's exam, the intraparenchymal hemorrhage within the right temporal lobe and small volume intraventricular hemorrhage layering within both occipital horns are essentially unchanged. 2. Small round high attenuation along the right aspect of the falx anteriorly may represent an additional  focus of small subdural hemorrhage or a small meningioma. 3. Stable 6 mm right to left midline shift and effacement of the right perimesencephalic cisterns. 4. Stable ventricular configuration. No interval development of hydronephrosis. Electronically Signed By: Jacqulynn Cadet M.D. On: 03/22/2015 07:31   Mr Jodene Nam Head Wo Contrast  03/20/2015 CLINICAL DATA: Altered mental status. Left-sided hemiparesis. Dysarthria and slurred speech beginning yesterday. Intracranial hemorrhage. EXAM: MRI HEAD WITHOUT CONTRAST MRA HEAD WITHOUT CONTRAST TECHNIQUE: Multiplanar, multiecho pulse sequences of the brain and surrounding structures were obtained without intravenous contrast. Angiographic images of the head were obtained using MRA technique without contrast. COMPARISON: CT head without contrast 03/20/2015 FINDINGS: MRI HEAD FINDINGS A 6.2 x 2.6 x 4.0 cm hemorrhage within the right temporal lobe is not significantly changed. Mixed intensities correspond tube all vein blood products. There is no evidence for new hemorrhage. There is a fluid level within the more superior component of the hemorrhage. Surrounding vasogenic edema is evident. Midline shift of 8 mm 1 cut above the foramen of Monro is not significantly changed from the CT scan. There is partial effacement of the right lateral ventricle. The basal cisterns are intact. Periventricular and subcortical T2 changes are evident bilaterally. T2 changes extend into the brainstem. Flow is present in the major intracranial arteries. No underlying mass lesion is evident. Bilateral lens replacements are noted. Mild mucosal thickening is present in the ethmoid air cells and frontal sinuses bilaterally. The mastoid air cells are clear. There is some layering blood within the ventricles bilaterally. No associated hydrocephalus is present. A small subdural collection is again noted on the right with next blood products layering. MRA HEAD FINDINGS The internal  carotid arteries demonstrate mild degenerative changes within the cavernous segments bilaterally. Prominent posterior communicating arteries are present. The right A1 segment is dominant. M1 segments are within normal limits bilaterally. The anterior communicating artery is patent. The MCA bifurcations are intact. There is moderate attenuation of MCA branch vessels bilaterally. A right-sided MCA branch vessels are displaced superiorly by the hemorrhage. Midline shift is evident. The right vertebral artery is the dominant vessel. The left vertebral artery essentially terminates at the PICA. Is very small distal left vertebral artery extends to the vertebrobasilar margin with marked stenosis. The right PICA origin is visualized and normal. The basilar artery is small terminating at the superior cerebellar arteries. Fetal type posterior cerebral arteries are present bilaterally with moderate PCA branch vessel narrowing. IMPRESSION: 1. Stable size of large right temporal lobe parenchymal hemorrhage with layering blood products. 2. Similar appearance of diffuse vasogenic edema and 8 mm midline shift. 3. Stable small right extra-axial hemorrhage. 4. Blood products layering within the ventricles bilaterally without hydrocephalus. 5. Stable chronic small vessel T2 changes. 6. The MRA demonstrates displacement of the MCA branch vessels without a focal lesion to explain the hemorrhage. 7. Diffuse small vessel disease is present on the MRA. Electronically Signed By: San Morelle M.D. On: 03/20/2015 13:21   Mr Brain Wo Contrast  03/20/2015 CLINICAL DATA: Altered mental status. Left-sided hemiparesis. Dysarthria and slurred speech beginning yesterday. Intracranial hemorrhage. EXAM: MRI HEAD WITHOUT CONTRAST MRA HEAD WITHOUT CONTRAST TECHNIQUE: Multiplanar, multiecho pulse sequences of the brain and surrounding structures were obtained without intravenous  contrast. Angiographic images of the head were  obtained using MRA technique without contrast. COMPARISON: CT head without contrast 03/20/2015 FINDINGS: MRI HEAD FINDINGS A 6.2 x 2.6 x 4.0 cm hemorrhage within the right temporal lobe is not significantly changed. Mixed intensities correspond tube all vein blood products. There is no evidence for new hemorrhage. There is a fluid level within the more superior component of the hemorrhage. Surrounding vasogenic edema is evident. Midline shift of 8 mm 1 cut above the foramen of Monro is not significantly changed from the CT scan. There is partial effacement of the right lateral ventricle. The basal cisterns are intact. Periventricular and subcortical T2 changes are evident bilaterally. T2 changes extend into the brainstem. Flow is present in the major intracranial arteries. No underlying mass lesion is evident. Bilateral lens replacements are noted. Mild mucosal thickening is present in the ethmoid air cells and frontal sinuses bilaterally. The mastoid air cells are clear. There is some layering blood within the ventricles bilaterally. No associated hydrocephalus is present. A small subdural collection is again noted on the right with next blood products layering. MRA HEAD FINDINGS The internal carotid arteries demonstrate mild degenerative changes within the cavernous segments bilaterally. Prominent posterior communicating arteries are present. The right A1 segment is dominant. M1 segments are within normal limits bilaterally. The anterior communicating artery is patent. The MCA bifurcations are intact. There is moderate attenuation of MCA branch vessels bilaterally. A right-sided MCA branch vessels are displaced superiorly by the hemorrhage. Midline shift is evident. The right vertebral artery is the dominant vessel. The left vertebral artery essentially terminates at the PICA. Is very small distal left vertebral artery extends to the vertebrobasilar margin with marked stenosis. The right PICA origin is  visualized and normal. The basilar artery is small terminating at the superior cerebellar arteries. Fetal type posterior cerebral arteries are present bilaterally with moderate PCA branch vessel narrowing. IMPRESSION: 1. Stable size of large right temporal lobe parenchymal hemorrhage with layering blood products. 2. Similar appearance of diffuse vasogenic edema and 8 mm midline shift. 3. Stable small right extra-axial hemorrhage. 4. Blood products layering within the ventricles bilaterally without hydrocephalus. 5. Stable chronic small vessel T2 changes. 6. The MRA demonstrates displacement of the MCA branch vessels without a focal lesion to explain the hemorrhage. 7. Diffuse small vessel disease is present on the MRA. Electronically Signed By: San Morelle M.D. On: 03/20/2015 13:21   Dg Chest Port 1 View  03/20/2015 CLINICAL DATA: Central line clotted, initial encounter. EXAM: PORTABLE CHEST - 1 VIEW COMPARISON: None. FINDINGS: The heart size and mediastinal contours are within normal limits. Both lungs are clear. No pneumothorax or pleural effusion is noted. Left internal jugular catheter is noted with distal tip in expected position of the SVC. The visualized skeletal structures are unremarkable. IMPRESSION: Left internal jugular catheter is noted with distal tip in expected position the SVC. No acute cardiopulmonary abnormality seen. Electronically Signed By: Marijo Conception, M.D. On: 03/20/2015 15:10     Assessment/Plan: Diagnosis: Right temporal intraparenchymal hemorrhage 1. Does the need for close, 24 hr/day medical supervision in concert with the patient's rehab needs make it unreasonable for this patient to be served in a less intensive setting? Yes 2. Co-Morbidities requiring supervision/potential complications: Hypokalemia, history of hypertension 3. Due to bladder management, bowel management, safety, skin/wound care, disease management, medication administration,  pain management and patient education, does the patient require 24 hr/day rehab nursing? Yes 4. Does the patient require coordinated care of a physician,  rehab nurse, PT (1-2 hrs/day, 5 days/week), OT (1-2 hrs/day, 5 days/week) and SLP (0.5-1 hrs/day, 5 days/week) to address physical and functional deficits in the context of the above medical diagnosis(es)? Yes Addressing deficits in the following areas: balance, endurance, locomotion, strength, transferring, bowel/bladder control, bathing, dressing, feeding, grooming, toileting, cognition, swallowing and psychosocial support 5. Can the patient actively participate in an intensive therapy program of at least 3 hrs of therapy per day at least 5 days per week? Potentially 6. The potential for patient to make measurable gains while on inpatient rehab is good 7. Anticipated functional outcomes upon discharge from inpatient rehab are supervision with PT, supervision with OT, supervision with SLP. 8. Estimated rehab length of stay to reach the above functional goals is: 7-10 days 9. Does the patient have adequate social supports and living environment to accommodate these discharge functional goals? Potentially 10. Anticipated D/C setting: Home 11. Anticipated post D/C treatments: [place] therapy 12. Overall Rehab/Functional Prognosis: good  RECOMMENDATIONS: This patient's condition is appropriate for continued rehabilitative care in the following setting: CIR once potassium is corrected and patient is able to tolerate up in a chair at least 3 hours Patient has agreed to participate in recommended program. Yes Note that insurance prior authorization may be required for reimbursement for recommended care.  Comment: Potassium on 7/29 was 2.3    03/22/2015       Revision History     Date/Time User Provider Type Action   03/22/2015 2:36 PM Carolyn Blake, MD Physician Sign   03/22/2015 10:55 AM Cathlyn Parsons, PA-C Physician Assistant [place]     View Details Report       Routing History     Date/Time From To Method   03/22/2015 2:36 PM Carolyn Blake, MD Carolyn Blake, MD In Basket   03/22/2015 2:36 PM Carolyn Blake, MD Marin Olp, MD In [facility]

## 2015-03-29 NOTE — Progress Notes (Signed)
Carolyn Diones, RN Rehab Admission Coordinator Signed Physical Medicine and Rehabilitation PMR Pre-admission 03/28/2015 1:46 PM  Related encounter: ED to Hosp-Admission (Current) from 03/19/2015 in [facility] Collapse All   PMR Admission Coordinator Pre-Admission Assessment  Patient: [name] is an 81 y.o., female MRN: [id number] DOB: October 23, 1917 Height: _0  (149.9 cm) Weight: 54.885 kg (121 lb)  Insurance Information HMO: Yes PPO: PCP: IPA: 80/20: OTHER: Group # [id number] PRIMARY: U[employer] Medicare Policy#: [id number] Subscriber: [name] CM Name: [name] Phone#: [phone] Fax#:  Pre-Cert#: [id number] Employer: Retired Benefits: Phone #: [phone] Name: On line Eff. Date: 08/24/14 Deduct: $0 Out of Pocket Max: $4900 (met $118.10) Life Max: None CIR: $345 days 1-5 SNF: $0 days 1-20; $160 days 21-51; $0 days 52-100 Outpatient: medical necessity Co-Pay: $40 copay Home Health: 100% Co-Pay: 20% DME: 80% Co-Pay: 20% Providers: in network  Emergency Contact Information Contact Information    Name Relation Home Work Mobile   Zinser,Joan Daughter [phone]  [phone]   Nayleen, Janosik [phone]  [phone]     Current Medical History  Patient Admitting Diagnosis:Right temporal-parietal intraparenchymal hemorrhage  History of Present Illness: A 81 y.o. right handed female with history of hypertension. Independent prior to admission without assistive device and still driving. Presented 03/19/2015 with altered mental status left-sided weakness and slurred speech. Blood pressure in the ED was 140/80. CT of the head and imaging showed a large right temporal  intraparenchymal hematoma. Right to left midline shift of 6 mm. Troponin 0.4 felt to be related to demand ischemia and no other work up indicated. Neurosurgery consulted and advised conservative care.Keppra for seizure prophylaxis. Follow-up CT of the brain stable without hydrocephalus. MRA of the head with diffuse small vessel disease. EEG completed consistent with encephalopathy and no seizure activity recorded. Echocardiogram with ejection fraction of 70% no wall motion abnormalities. Subcutaneous heparin for DVT prophylaxis initiated 03/25/2015. Follow-up cranial CT scan 03/27/2015 shows no new hemorrhage or mass effect. Patient with elevated WBC on 03/26/15 and believed to be UTI, antibiotics started. WBC trending down. Currently maintained on the dysphagia #2 thin liquids.. Bouts of hypokalemia resolved with potassium supplement. Physical and occupational therapy evaluation completed 03/21/2015 with recommendations of physical medicine rehabilitation consult. Patient to be admitted for a comprehensive inpatient rehabilitation program.   Total: 12=NIH  Past Medical History  Past Medical History  Diagnosis Date  . Hypertension   . Osteopenia     Family History  family history includes Cancer in her daughter.  Prior Rehab/Hospitalizations:  Has the patient had major surgery during 100 days prior to admission? No  Current Medications   Current facility-administered medications:  . stroke: mapping our early stages of recovery book, , Does not apply, Once, [name], MD . 0.9 % sodium chloride infusion, , Intravenous, Continuous, [name], MD, Last Rate: 50 mL/hr at 03/28/15 2223 . acetaminophen (TYLENOL) tablet 650 mg, 650 mg, Oral, Q4H PRN, 650 mg at 03/28/15 2110 **OR** [DISCONTINUED] acetaminophen (TYLENOL) suppository 650 mg, 650 mg, Rectal, Q4H PRN, [name], MD, 650 mg at 03/20/15 2127 . antiseptic oral rinse (CPC / CETYLPYRIDINIUM CHLORIDE  0.05%) solution 7 mL, 7 mL, Mouth Rinse, q12n4p, [name], MD, 7 mL at 03/29/15 1200 . cefUROXime (CEFTIN) tablet 500 mg, 500 mg, Oral, BID WC, [name], MD, 500 mg at 03/29/15 0926 . chlorhexidine (PERIDEX) 0.12 % solution 15 mL, 15 mL, Mouth Rinse, BID, Wesam G  Nelda Marseille, MD, 15 mL at 03/29/15 0925 . diclofenac sodium (VOLTAREN) 1 % transdermal gel 2 g, 2 g, Topical, QID, Garvin Fila, MD, 2 g at 03/29/15 2952 . feeding supplement (ENSURE ENLIVE) (ENSURE ENLIVE) liquid 237 mL, 237 mL, Oral, BID PRN, [name], RD . heparin injection 5,000 Units, 5,000 Units, Subcutaneous, Q12H, [name], MD, 5,000 Units at 03/29/15 0926 . labetalol (NORMODYNE,TRANDATE) injection 20 mg, 20 mg, Intravenous, Q2H PRN, [name], 20 mg at 03/23/15 0825 . levETIRAcetam (KEPPRA) tablet 500 mg, 500 mg, Oral, BID, Garvin Fila, MD, 500 mg at 03/29/15 0926 . losartan (COZAAR) tablet 100 mg, 100 mg, Oral, Daily, Garvin Fila, MD, 100 mg at 03/29/15 0926 . pantoprazole (PROTONIX) EC tablet 40 mg, 40 mg, Oral, QHS, Garvin Fila, MD, 40 mg at 03/28/15 2101 . polyethylene glycol (MIRALAX / GLYCOLAX) packet 17 g, 17 g, Oral, Daily PRN, [name], MD . senna-docusate (Senokot-S) tablet 1 tablet, 1 tablet, Oral, BID, [name], MD, 1 tablet at 03/29/15 8413  Patients Current Diet: DIET DYS 2 Room service appropriate?: Yes; Fluid consistency:: Thin  Precautions / Restrictions Precautions Precautions: Fall Precautions/Special Needs: Other Precaution Comments: Seizure precautions Restrictions Weight Bearing Restrictions: No   Has the patient had 2 or more falls or a fall with injury in the past year?No  Prior Activity Level Community (5-7x/wk): Went out 4 X a week. Drove short distances to beauty shop, drug store.  Home Assistive Devices / Equipment Home Equipment: Shower seat - built in, Grab bars - tub/shower, Grab bars - toilet  Prior Device Use: Indicate  devices/aids used by the patient prior to current illness, exacerbation or injury? None of the above. Did use a walker for 4-5 days recently when her knee was bothering her.  Prior Functional Level Prior Function Level of Independence: Independent Comments: Pt reports she was independent with ADLs. Occasionally sat to shower, but often stands. Does not use AD for ambulation. Spouse is currently in SNF rehab   Self Care: Did the patient need help bathing, dressing, using the toilet or eating? Independent  Indoor Mobility: Did the patient need assistance with walking from room to room (with or without device)? Independent  Stairs: Did the patient need assistance with internal or external stairs (with or without device)? Independent. Does have a chair lift up/down from her apartment in daughters home.  Functional Cognition: Did the patient need help planning regular tasks such as shopping or remembering to take medications? Independent  Current Functional Level Cognition  Arousal/Alertness: Awake/alert Overall Cognitive Status: Impaired/Different from baseline Current Attention Level: Sustained Orientation Level: Oriented to person, Oriented to place Following Commands: Follows one step commands consistently Safety/Judgement: Decreased awareness of deficits General Comments: improved arousal this session compared to previous session. Patient also more consistent with follow commands. Attention: Sustained Sustained Attention: Appears intact Memory: Impaired Memory Impairment: Decreased recall of new information Awareness: Appears intact Problem Solving: Appears intact (with basic) Safety/Judgment: Appears intact   Extremity Assessment (includes Sensation/Coordination)  Upper Extremity Assessment: Defer to OT evaluation RUE Deficits / Details: tremors noted - pt indicates these are new, but no family present to determine her baseline  RUE Coordination: decreased fine motor,  decreased gross motor LUE Deficits / Details: tremors noted - pt indicates these are new, but no family present to determine her baseline LUE Coordination: decreased fine motor, decreased gross motor  Lower Extremity Assessment: Overall WFL for tasks assessed, Generalized weakness    ADLs  Overall ADL's : Needs assistance/impaired Eating/Feeding: Minimal assistance, Moderate assistance, Sitting Eating/Feeding Details (indicate cue type and reason): min A to drink from cup. Mod A to manage utensils  Grooming: Wash/dry hands, Wash/dry face, Minimal assistance, Sitting Upper Body Bathing: Maximal assistance, Sitting Lower Body Bathing: Maximal assistance, Sit to/from stand Upper Body Dressing : Maximal assistance, Sitting Lower Body Dressing: Total assistance, Sit to/from stand Toilet Transfer: Minimal assistance, Ambulation, BSC Toileting- Clothing Manipulation and Hygiene: Maximal assistance, Sit to/from stand Functional mobility during ADLs: +2 for physical assistance, Minimal assistance    Mobility  Overal bed mobility: Needs Assistance Bed Mobility: Rolling, Sidelying to Sit Rolling: Mod assist Sidelying to sit: Mod assist Supine to sit: Max assist Sit to supine: Max assist General bed mobility comments: patient cues for initiation of movement LEs to EOB, patient able to provide some assist to come to EOB with increased assist required for poer uup to sitting. improved sitting balance and attempts to scoot to EOb this session    Transfers  Overall transfer level: Needs assistance Equipment used: 2 person hand held assist Transfers: Sit to/from Stand Sit to Stand: +2 physical assistance, Mod assist Stand pivot transfers: Mod assist, +2 physical assistance General transfer comment: cues for hand placement; assist to come forward and power up; tactile cues to advance/step LLE towards chair. Faciliatation of upright postuer required.    Ambulation / Gait / Stairs /  Wheelchair Mobility  Ambulation/Gait Ambulation/Gait assistance: Museum/gallery curator (Feet): 10 Feet (14 feet to recliner) Assistive device: Rolling walker (2 wheeled) Gait Pattern/deviations: Decreased step length - right, Decreased step length - left, Step-through pattern General Gait Details: unable due to lethargy Gait velocity: very slow and tentative    Posture / Balance Dynamic Sitting Balance Sitting balance - Comments: able to sit EOB without physical assist this session Balance Overall balance assessment: Needs assistance Sitting-balance support: Feet supported Sitting balance-Leahy Scale: Fair Sitting balance - Comments: able to sit EOB without physical assist this session Standing balance support: Bilateral upper extremity supported Standing balance-Leahy Scale: Poor Standing balance comment: needs RW or HHA    Special needs/care consideration BiPAP/CPAP No CPM No Continuous Drip IV 0.9% NS 30 ml/hr Dialysis No  Life Vest No Oxygen No Special Bed No Trach Size No Wound Vac (area) No  Skin No  Bowel mgmt: Last BM 03/26/15 with incontinence Bladder mgmt: Urinary catheter Diabetic mgmt No    Previous Home Environment Living Arrangements: Children Available Help at Discharge: Family (unsure) Type of Home: House Home Layout: Two level, Able to live on main level with bedroom/bathroom Alternate Level Stairs-Number of Steps: flight - pt has stair lift  Home Access: Stairs to enter [employer] Shower/Tub: Multimedia programmer: Handicapped height Bathroom Accessibility: Yes How Accessible: Accessible via wheelchair Additional Comments: Pt and spouse live with her daughter and son in law. They have an apartment in the basement of the house. However, they have to walk around to the back of the house to access the door to their apartment. Or, they can enter in the front door and has stair lift to basement  apartment   Discharge Living Setting Plans for Discharge Living Setting: House, Lives with (comment) (Lives with daughter and son-in-law apt in home.) Type of Home at Discharge: House Discharge Home Layout: Multi-level, Able to live on main level with bedroom/bathroom (Lives in bottom level in law apartment.) Alternate Level Stairs-Number of Steps: Has a chair lift Discharge Home Access: Level entry (Building ramp at [employer]  out back.)  Social/Family/Support Systems Patient Roles: Spouse, Parent (Has a husband, daughter, son-in-law.) Contact Information: Fredrich Birks - daughter Anticipated Caregiver: daughter and hired help Anticipated Caregiver's Contact Information: [name] - daughter (c) [phone] Ability/Limitations of Caregiver: Husband coming home 08/05 from [facility]. Dtr retired and can assist. Can hire help as needed. Caregiver Availability: 24/7 Discharge Plan Discussed with Primary Caregiver: Yes Is Caregiver In Agreement with Plan?: Yes Does Caregiver/Family have Issues with Lodging/Transportation while Pt is in Rehab?: No  Goals/Additional Needs Patient/Family Goal for Rehab: PT/OT/ST supervision goals Expected length of stay: 7-10 days Cultural Considerations: None Dietary Needs: Dys 2, thin liquids Equipment Needs: TBD Pt/Family Agrees to Admission and willing to participate: Yes Program Orientation Provided & Reviewed with Pt/Caregiver Including Roles & Responsibilities: Yes  Decrease burden of Care through IP rehab admission: N/A  Possible need for SNF placement upon discharge: Yes, if patient does not progress to level where daughter and care givers can manage at home  Patient Condition: This patient's medical and functional status has changed since the consult dated: 03/22/15 in which the Rehabilitation Physician determined and documented that the patient's condition is appropriate for intensive rehabilitative care in an inpatient rehabilitation facility. See  "History of Present Illness" (above) for medical update. Functional changes are: Currently requiring mod assist +2 for stand pivot transfers. Patient's medical and functional status update has been discussed with the Rehabilitation physician and patient remains appropriate for inpatient rehabilitation. Will admit to inpatient rehab today.  Preadmission Screen Completed By: [name], 03/29/2015 10:35 AM ______________________________________________________________________  Discussed status with Dr. Naaman Plummer on 03/29/15 at 1035 and received telephone approval for admission today.  Admission Coordinator: [name], time1035/Date08/05/16          Cosigned by: Meredith Staggers, MD at 03/29/2015 11:14 AM  Revision History     Date/Time User Provider Type Action   03/29/2015 11:14 AM Meredith Staggers, MD Physician Cosign   03/29/2015 10:36 AM Carolyn Diones, RN Rehab Admission Coordinator Sign

## 2015-03-29 NOTE — Care Management Note (Deleted)
Inpatient Rehabilitation Center Individual Statement of Services  Patient Name:  [name]  Date:  03/29/2015  Welcome to the [facility].  Our goal is to provide you with an individualized program based on your diagnosis and situation, designed to meet your specific needs.  With this comprehensive rehabilitation program, you will be expected to participate in at least 3 hours of rehabilitation therapies Monday-Friday, with modified therapy programming on the weekends.  Your rehabilitation program will include the following services:  Physical Therapy (PT), Occupational Therapy (OT), 24 hour per day rehabilitation nursing, Case Management (Social Worker), Rehabilitation Medicine, Nutrition Services and Pharmacy Services  Weekly team conferences will be held on Tuesdays to discuss your progress.  Your Social Worker will talk with you frequently to get your input and to update you on team discussions.  Team conferences with you and your family in attendance may also be held.  Expected length of stay: 7-10 days  Overall anticipated outcome: modified independent  Depending on your progress and recovery, your program may change. Your Social Worker will coordinate services and will keep you informed of any changes. Your Social Worker's name and contact numbers are listed  below.  The following services may also be recommended but are not provided by the [facility] will be made to provide these services after discharge if needed.  Arrangements include referral to agencies that provide these services.  Your insurance has been verified to be:  [place] Your primary doctor is:  Dr. Nevada Crane  Pertinent information will be shared with your doctor and your insurance company.  Social Worker:  [place], [place] or  [phone]   Information discussed with and copy given to patient by: Lennart Pall, 03/29/2015, 3:51 PM

## 2015-03-30 ENCOUNTER — Inpatient Hospital Stay (HOSPITAL_COMMUNITY): Payer: Medicare Other | Admitting: Occupational Therapy

## 2015-03-30 ENCOUNTER — Inpatient Hospital Stay (HOSPITAL_COMMUNITY): Payer: Medicare Other | Admitting: Physical Therapy

## 2015-03-30 ENCOUNTER — Inpatient Hospital Stay (HOSPITAL_COMMUNITY): Payer: Medicare Other | Admitting: Speech Pathology

## 2015-03-30 DIAGNOSIS — I480 Paroxysmal atrial fibrillation: Secondary | ICD-10-CM

## 2015-03-30 NOTE — Evaluation (Signed)
Physical Therapy Assessment and Plan  Patient Details  Name: [name] MRN: [id number] Date of Birth: August 07, 1918  PT Diagnosis: Abnormal posture, Abnormality of gait, Cognitive deficits, Difficulty walking, Impaired cognition, Muscle weakness and Pain in knee Rehab Potential: Fair ELOS: 3 weeks   Today's Date: 03/30/2015 PT Individual Time: 1430-1500, 0800-0900 PT Individual Time Calculation (min): 30 min , 60 min  Problem List:  Patient Active Problem List   Diagnosis Date Noted  . Atrial fibrillation 03/29/2015  . UTI (urinary tract infection) 03/28/2015  . Altered mental status   . Lethargy   . Leukocytosis   . ICH (intracerebral hemorrhage)   . Seizures   . Brain bleed   . Cerebral hemorrhage   . Cytotoxic brain edema 03/22/2015  . Brain herniation 03/22/2015  . Cerebral parenchymal hemorrhage 03/19/2015  . Dizzy spells 11/10/2014  . SPINAL STENOSIS, CERVICAL 06/11/2010  . Essential hypertension 06/22/2006  . Arthritis of right knee 06/22/2006  . Osteopenia 06/22/2006    Past Medical History:  Past Medical History  Diagnosis Date  . Hypertension   . Osteopenia    Past Surgical History:  Past Surgical History  Procedure Laterality Date  . Cataract extraction      Assessment & Plan Clinical Impression: Patient is a 81 y.o. year old female with recent admission to the hospital on 03/19/15 with R temporal hematoma.  Patient transferred to CIR on 03/29/2015 .   Patient currently requires max with mobility secondary to muscle weakness and muscle joint tightness, decreased cardiorespiratoy endurance, impaired timing and sequencing, unbalanced muscle activation and decreased coordination and decreased initiation, decreased attention, decreased problem solving, decreased safety awareness, decreased memory and delayed processing.  Prior to hospitalization, patient was independent  with mobility and lived with Spouse, Daughter in a House home.  Home access is  Stairs to  enter.  Patient will benefit from skilled PT intervention to maximize safe functional mobility, minimize fall risk and decrease caregiver burden for planned discharge home with 24 hour assist / SNF.  Anticipate patient will benefit from follow up [facility] at discharge.  PT - End of Session Endurance Deficit: Yes PT Assessment Rehab Potential (ACUTE/IP ONLY): Fair Barriers to Discharge: Decreased caregiver support PT Patient demonstrates impairments in the following area(s): Balance;Motor;Skin Integrity;Pain;Safety;Endurance PT Transfers Functional Problem(s): Bed Mobility;Bed to Chair;Car;Furniture;Floor PT Locomotion Functional Problem(s): Ambulation;Wheelchair Mobility;Stairs PT Plan PT Intensity: Minimum of 1-2 x/day ,45 to 90 minutes PT Frequency: 5 out of 7 days PT Duration Estimated Length of Stay: 3 weeks PT Treatment/Interventions: Ambulation/gait training;DME/adaptive equipment instruction;Psychosocial support;UE/LE Strength taining/ROM;Balance/vestibular training;Functional electrical stimulation;Cognitive remediation/compensation;Functional mobility training;Neuromuscular re-education;Community reintegration;Stair training;UE/LE Coordination activities;Wheelchair propulsion/positioning;Discharge planning;Pain management;Therapeutic Activities;Disease management/prevention;Patient/family education;Therapeutic Exercise PT Transfers Anticipated Outcome(s): CGA PT Locomotion Anticipated Outcome(s): CGA 150' with LRAD PT Recommendation Recommendations for Other Services: Neuropsych consult Follow Up Recommendations: Skilled nursing facility;Home health PT;24 hour supervision/assistance Patient destination: [place] (SNF) Equipment Details: RW, bedside commode if home  Skilled Therapeutic Intervention Tx 1: Pt educated on rehab plan, safety in mobility, importance of OOB, positioning, and progressing mobility. Pt performs transfer training x10 in session. Pt performs  unsupported sitting 3x3' as active rest. Anterior weight shifts, seated knee flexion 2x10. Pt performs static standing 15"x3.  Tx 2: Pt educated on rehab plan, progressing mobility, and importance of weight shifting. Transfers x8 in session. Static standing x2 and 1'x2. Pt performs LAQs, marching, hip abd AROM 2x10. Pt performs unsupported sitting x 5'.   PT Evaluation Precautions/Restrictions Precautions Precautions: Fall Precaution Comments: Seizure  precautions Restrictions Weight Bearing Restrictions: No General Chart Reviewed: Yes Vital SignsTherapy Vitals Pulse Rate: 90 (at rest, 89 with activity) BP: (!) 130/98 mmHg (at rest, BP 140/61 with activity) Oxygen Therapy SpO2: 98 % (RA at rest) Pain Pain Assessment Pain Assessment: 0-10 Pain Score: 5  Pain Location: Knee Pain Orientation: Right Pain Descriptors / Indicators: Aching Pain Intervention(s): Repositioned Multiple Pain Sites: No Home Living/Prior Functioning Home Living Available Help at Discharge: Family (Pt lives with daughter in their basement with spouse who recently discharged from SNF.) Type of Home: House Home Access: Stairs to enter Home Layout: Two level;Able to live on main level with bedroom/bathroom Alternate Level Stairs-Number of Steps: flight - pt has stair lift  Bathroom Shower/Tub: Multimedia programmer: Handicapped height Bathroom Accessibility: Yes Additional Comments: Pt and spouse live with her daughter and son in [place].  They have an apartment in the basement of the house.  However, they have to walk around to the back of the house to access the door to their apartment.  Or, they can enter in the front door and has stair lift to basement apartment   Lives With: Spouse;Daughter Prior Function Level of Independence: Independent with basic ADLs;Independent with gait Vocation: Retired Comments: Pt reports she was independent with ADLs. Occasionally sat to shower, but often stands.  Does not  use AD for ambulation.  Spouse was in SNF rehab  Vision/Perception     Cognition Overall Cognitive Status: Impaired/Different from baseline Arousal/Alertness: Awake/alert Memory: Impaired Memory Impairment: Decreased recall of new information Safety/Judgment: Impaired Comments: Pt is very confused during session. Quick release belt will be donned when pt OOB in wheelchair.  Sensation Sensation Light Touch: Appears Intact Stereognosis: Not tested Hot/Cold: Appears Intact Proprioception: Appears Intact Coordination Gross Motor Movements are Fluid and Coordinated: No Fine Motor Movements are Fluid and Coordinated: No Finger Nose Finger Test: +dysmetria Heel Shin Test: +dysmetria Motor  Motor Motor: Abnormal postural alignment and control;Motor apraxia Motor - Skilled Clinical Observations: generalized weakness and apraxia  Mobility Bed Mobility Bed Mobility: Sit to Supine Sit to Supine: 2: Max assist Sit to Supine - Details: Verbal cues for sequencing;Verbal cues for technique;Verbal cues for precautions/safety;Manual facilitation for placement Sit to Supine - Details (indicate cue type and reason): with cues for weight shift and technique Transfers Transfers: Yes Sit to Stand: 2: Max assist Stand to Sit: 2: Max assist Stand Pivot Transfers: 2: Max Psychologist, occupational Details (indicate cue type and reason): with cues for weight shift and technique Locomotion  Ambulation Ambulation: Yes Ambulation/Gait Assistance: 2: Max assist Ambulation Distance (Feet): 5 Feet Assistive device:  (HHA) Gait Gait: Yes Gait velocity: posterior lean, decreased cadence, shuffling steps Stairs / Additional Locomotion Stairs: No (unsafe to attempt) Architect: Yes Wheelchair Assistance: 1: +1 Total assist Distance: to and from gym, unable to sequence   Trunk/Postural Assessment  Cervical Assessment Cervical Assessment: Exceptions to [facility] Thoracic  Assessment Thoracic Assessment: Exceptions to [facility] (hypomobile) Lumbar Assessment Lumbar Assessment: Exceptions to [facility] (hypomobile)  Balance Balance Balance Assessed: Yes Static Sitting Balance Static Sitting - Level of Assistance: 4: Min assist Dynamic Sitting Balance Dynamic Sitting - Level of Assistance: 3: Mod assist Static Standing Balance Static Standing - Level of Assistance: 2: Max assist Dynamic Standing Balance Dynamic Standing - Level of Assistance: 2: Max assist Extremity Assessment  RUE Assessment RUE Assessment: Exceptions to [facility] RUE AROM (degrees) Overall AROM Right Upper Extremity: Deficits RUE Overall AROM Comments: Grossly WFL except  shoulder abd/flex to 70 degrees RUE Strength RUE Overall Strength: Deficits RUE Overall Strength Comments: grossly 3/5 except B/L shoulder flex and abd 3-/5 LUE Assessment LUE Assessment: Exceptions to WFL LUE AROM (degrees) LUE Overall AROM Comments: Grossly WFL except shoulder abd/flex to 70 degrees LUE Strength LUE Overall Strength: Deficits LUE Overall Strength Comments: grossly 3/5 except B/L shoulder flex/abd 3-/5 RLE Assessment RLE Assessment: Exceptions to [facility] RLE AROM (degrees) Overall AROM Right Lower Extremity: Deficits RLE Overall AROM Comments: AAROM WFL, difficulty with AROM RLE Strength RLE Overall Strength: Deficits RLE Overall Strength Comments: grossly 3-/5 LLE Assessment LLE Assessment: Exceptions to WFL LLE AROM (degrees) Overall AROM Left Lower Extremity: Deficits LLE Overall AROM Comments: AAROM grossly WFL, AROM limited by command following  FIM:  FIM - Bed/Chair Transfer Bed/Chair Transfer: 2: Chair or W/C > Bed: Max A (lift and lower assist);2: Supine > Sit: Max A (lifting assist/Pt. 25-49%);2: Bed > Chair or W/C: Max A (lift and lower assist) FIM - Locomotion: Wheelchair Distance: to and from gym, unable to sequence  Locomotion: Wheelchair: 1: Total Assistance/staff pushes wheelchair  (Pt<25%) FIM - Locomotion: Ambulation Ambulation/Gait Assistance: 2: Max assist Locomotion: Ambulation: 1: Travels less than 50 ft with maximal assistance (Pt: 25 - 49%) FIM - Locomotion: Stairs Locomotion: Scientist, physiological:  (unsafe)   Refer to Care Plan for Long Term Goals  Recommendations for other services: Neuropsych  Discharge Criteria: Patient will be discharged from PT if patient refuses treatment 3 consecutive times without medical reason, if treatment goals not met, if there is a change in medical status, if patient makes no progress towards goals or if patient is discharged from hospital.  The above assessment, treatment plan, treatment alternatives and goals were discussed and mutually agreed upon: by patient  Monia Pouch 03/30/2015, 12:27 PM

## 2015-03-30 NOTE — Evaluation (Signed)
Occupational Therapy Assessment and Plan  Patient Details  Name: [name] MRN: [id number] Date of Birth: November 08, 1917  OT Diagnosis: abnormal posture, acute pain, cognitive deficits and muscle weakness (generalized) Rehab Potential: Rehab Potential (ACUTE ONLY): Fair ELOS: 19-21 days   Today's Date: 03/30/2015 OT Individual Time: 0900 - 0945   45 minutes intervention  15 missed minutes from pt fatigue    Problem List:  Patient Active Problem List   Diagnosis Date Noted  . Atrial fibrillation 03/29/2015  . UTI (urinary tract infection) 03/28/2015  . Altered mental status   . Lethargy   . Leukocytosis   . ICH (intracerebral hemorrhage)   . Seizures   . Brain bleed   . Cerebral hemorrhage   . Cytotoxic brain edema 03/22/2015  . Brain herniation 03/22/2015  . Cerebral parenchymal hemorrhage 03/19/2015  . Dizzy spells 11/10/2014  . SPINAL STENOSIS, CERVICAL 06/11/2010  . Essential hypertension 06/22/2006  . Arthritis of right knee 06/22/2006  . Osteopenia 06/22/2006    Past Medical History:  Past Medical History  Diagnosis Date  . Hypertension   . Osteopenia    Past Surgical History:  Past Surgical History  Procedure Laterality Date  . Cataract extraction      Assessment & Plan Clinical Impression: Patient is a 81 y.o. year old female with history of hypertension. Independent prior to admission without assistive device and still driving. Presented 03/19/2015 with altered mental status left-sided weakness and slurred speech. Blood pressure in the ED was 140/80. CT of the head and imaging showed a large right temporal intraparenchymal hematoma. Right to left midline shift of 6 mm. Troponin 0.4 felt to be related to demand ischemia and no other work up indicated. Neurosurgery consulted and advised conservative care.Keppra for seizure prophylaxis. Follow-up CT of the brain stable without hydrocephalus. MRA of the head with diffuse small vessel disease. EEG completed  consistent with encephalopathy and no seizure activity recorded. Echocardiogram with ejection fraction of 70% no wall motion abnormalities. Subcutaneous heparin for DVT prophylaxis initiated 03/25/2015. Follow-up cranial CT scan 03/27/2015 shows no new hemorrhage or mass effect. Currently maintained on the dysphagia #2 thin liquids.. Bouts of hypokalemia resolved with potassium supplement. Initially placed on Rocephin for suspected UTI urine study showed no growth. Physical and occupational therapy evaluation completed 03/21/2015 with recommendations of physical medicine rehabilitation consult. Patient was admitted for a comprehensive rehabilitation program .  Patient transferred to CIR on 03/29/2015 .    Patient currently requires total with basic self-care skills secondary to muscle weakness, decreased cardiorespiratoy endurance, pain, decreased initiation, decreased attention, decreased awareness, decreased problem solving, decreased safety awareness, decreased memory and delayed processing and decreased sitting balance, decreased standing balance, decreased postural control and decreased balance strategies.  Prior to hospitalization, patient could complete ADLs with independent .  Patient will benefit from skilled intervention to decrease level of assist with basic self-care skills prior to discharge home with care partner.  Anticipate patient will require 24 hour supervision and minimal physical assistance and follow up home health and or SNF discharge.  OT - End of Session Activity Tolerance: Improving Endurance Deficit: Yes Endurance Deficit Description: mutliple rest breaks during session. Unable to finish 60 minutes session as pt was unable to keep eyes open OT Assessment Rehab Potential (ACUTE ONLY): Fair Barriers to Discharge:  (no family present during evaluation but unsure if they are able to assist [name] as her husband has also just discharged to daughters home from SNF) OT Patient  demonstrates impairments in  the following area(s): Balance;Pain;Cognition;Safety;Endurance;Motor OT Basic ADL's Functional Problem(s): Grooming;Bathing;Dressing;Toileting OT Advanced ADL's Functional Problem(s):  (n/a) OT Transfers Functional Problem(s): Toilet;Tub/Shower OT Additional Impairment(s): None OT Plan OT Intensity: Minimum of 1-2 x/day, 45 to 90 minutes OT Frequency: Total of 15 hours over 7 days of combined therapies OT Duration/Estimated Length of Stay: 19-21 days OT Treatment/Interventions: Balance/vestibular training;Community reintegration;Patient/family education;Self Care/advanced ADL retraining;UE/LE Coordination activities;Therapeutic Exercise;Discharge planning;DME/adaptive equipment instruction;Functional mobility training;Therapeutic Activities;UE/LE Strength taining/ROM OT Self Feeding Anticipated Outcome(s): n/a OT Basic Self-Care Anticipated Outcome(s): supervision - min A OT Toileting Anticipated Outcome(s): Min A OT Bathroom Transfers Anticipated Outcome(s): Min A OT Recommendation Recommendations for Other Services:  (none ) Patient destination: Home (home vs SNF) Follow Up Recommendations: 24 hour supervision/assistance;Home health OT;Skilled nursing facility Equipment Recommended: To be determined   Skilled Therapeutic Intervention Upon entering the room, pt seated in wheelchair and transitioning from PT session. Pt with 5/10 c/o pain in knee during session. Pt declined shower this session but agreeable to bathing and dressing at sink side. Pt required max A for sit <>stand and balance during session secondary to fatigue. Pt required max verbal cues for initiation, attention, and sequencing for routine self care task. Mod A stand pivot transfer onto standard toilet with use of grab bars. Pt required assistance for clothing management this session. Pt unable to finish LB dressing secondary to fatigue as pt began to close eyes and reports, "I just can't anymore".  Pt transferred back into bed with mod - max A stand pivot transfer from wheelchair. Bed alarm activated. Call bell and all needed items within reach upon exiting the room.   OT Evaluation Precautions/Restrictions  Precautions Precautions: Fall Precaution Comments: Seizure precautions Restrictions Weight Bearing Restrictions: No General OT Amount of Missed Time: 15 Minutes Vital Signs Therapy Vitals Temp: 60 F (37.2 C) Temp Source: Oral Pulse Rate: 90 (at rest, 89 with activity) Resp: 19 BP: (!) 130/98 mmHg (at rest, BP 140/61 with activity) Patient Position (if appropriate): Lying Oxygen Therapy SpO2: 98 % (RA at rest) O2 Device: Not Delivered Pain Pain Assessment Pain Assessment: 0-10 Pain Score: 5  Pain Location: Knee Pain Orientation: Right Pain Descriptors / Indicators: Aching Pain Intervention(s): Repositioned Multiple Pain Sites: No Home Living/Prior Functioning Home Living Available Help at Discharge: Family (Pt lives with daughter in their basement with spouse who recently discharged from SNF.) Type of Home: House Home Access: Stairs to enter Home Layout: Two level, Able to live on main level with bedroom/bathroom Alternate Level Stairs-Number of Steps: flight - pt has stair lift  Bathroom Shower/Tub: Multimedia programmer: Handicapped height Bathroom Accessibility: Yes Additional Comments: Pt and spouse live with her daughter and [place].  They have an apartment in the basement of the house.  However, they have to walk around to the back of the house to access the door to their apartment.  Or, they can enter in the front door and has stair lift to basement apartment   Lives With: Spouse, Daughter IADL History Homemaking Responsibilities: No Prior Function Level of Independence: Independent with basic ADLs, Independent with gait Vocation: Retired Comments: Pt reports she was independent with ADLs. Occasionally sat to shower, but often stands.   Does not use AD for ambulation.  Spouse was in SNF rehab  Vision/Perception  Vision- History Baseline Vision/History: Wears glasses Wears Glasses: Reading only Patient Visual Report: Other (comment) (Pt's visual report is unclear secondary to fatigue as pt is unable to keep eyes open at end of  session for vision test. ) Vision- Assessment Vision Assessment?: No apparent visual deficits  Cognition Overall Cognitive Status: Impaired/Different from baseline Arousal/Alertness: Awake/alert Orientation Level: Person Year: 2016 Month:  (Pt states, "I don't know") Day of Week: Other (Comment) ("I don't know") Memory: Impaired Memory Impairment: Decreased recall of new information Immediate Memory Recall:  (unable to recall) Memory Recall:  (unable to recall) Safety/Judgment: Impaired Comments: Pt is very confused during session. Quick release belt will be donned when pt OOB in wheelchair.  Sensation Sensation Light Touch: Appears Intact Stereognosis: Not tested Hot/Cold: Appears Intact Proprioception: Appears Intact Coordination Gross Motor Movements are Fluid and Coordinated: No Fine Motor Movements are Fluid and Coordinated: No Finger Nose Finger Test: +dysmetria Heel Shin Test: +dysmetria Motor  Motor Motor: Abnormal postural alignment and control;Motor apraxia Motor - Skilled Clinical Observations: generalized weakness and apraxia Mobility  Bed Mobility Bed Mobility: Sit to Supine Sit to Supine: 2: Max assist Sit to Supine - Details: Verbal cues for sequencing;Verbal cues for technique;Verbal cues for precautions/safety;Manual facilitation for placement Transfers Transfers: Sit to Stand;Stand to Sit Sit to Stand: 2: Max assist Stand to Sit: 2: Max assist  Trunk/Postural Assessment  Cervical Assessment Cervical Assessment: Exceptions to [facility] Thoracic Assessment Thoracic Assessment: Exceptions to [facility] (hypomobile) Lumbar Assessment Lumbar Assessment: Exceptions to [facility]  (hypomobile)  Balance Balance Balance Assessed: Yes Static Sitting Balance Static Sitting - Level of Assistance: 4: Min assist Dynamic Sitting Balance Dynamic Sitting - Level of Assistance: 3: Mod assist Static Standing Balance Static Standing - Level of Assistance: 2: Max assist Dynamic Standing Balance Dynamic Standing - Level of Assistance: 2: Max assist Extremity/Trunk Assessment RUE Assessment RUE Assessment: Exceptions to [facility] RUE AROM (degrees) Overall AROM Right Upper Extremity: Deficits (WFL but shoulder flexion and abduction limited to 70 degrees) LUE Assessment LUE Assessment: Exceptions to WFL LUE AROM (degrees) Overall AROM Left Upper Extremity:  (shoulder flexion and abduction limited to 70 degrees)  FIM:  FIM - Grooming Grooming Steps: Wash, rinse, dry face;Wash, rinse, dry hands Grooming: 5: Set-up assist to obtain items (2/2 items) FIM - Bathing Bathing Steps Patient Completed: Chest;Right Arm;Left Arm;Abdomen Bathing: 2: Max-Patient completes 3-4 [id number] parts or 25-49% FIM - Upper Body Dressing/Undressing Upper body dressing/undressing steps patient completed: Thread/unthread right bra strap;Thread/unthread right sleeve of pullover shirt/dresss;Thread/unthread left sleeve of pullover shirt/dress;Put head through opening of pull over shirt/dress Upper body dressing/undressing: 3: Mod-Patient completed 50-74% of tasks FIM - Lower Body Dressing/Undressing Lower body dressing/undressing: 1: Total-Patient completed less than 25% of tasks FIM - Toileting Toileting steps completed by patient: Performs perineal hygiene Toileting: 2: Max-Patient completed 1 of 3 steps FIM - Bed/Chair Transfer Bed/Chair Transfer: 2: Chair or W/C > Bed: Max A (lift and lower assist);2: Sit > Supine: Max A (lifting assist/Pt. 25-49%) FIM - TRadio producerDevices: Grab bars Toilet Transfers: 2-From toilet/BSC: Max A (lift and lower assist);2-To toilet/BSC: Max  A (lift and lower assist)   Refer to Care Plan for Long Term Goals  Recommendations for other services: None  Discharge Criteria: Patient will be discharged from OT if patient refuses treatment 3 consecutive times without medical reason, if treatment goals not met, if there is a change in medical status, if patient makes no progress towards goals or if patient is discharged from hospital.  The above assessment, treatment plan, treatment alternatives and goals were discussed and mutually agreed upon: by patient  PPhineas Semen8/01/2015, 10:02 AM

## 2015-03-30 NOTE — Progress Notes (Signed)
Subjective: Patient states "I just don't know how I feel". She denies pain. She would like to get up and start moving around.  Objective: BP 130/98 mmHg  Pulse 90  Temp(Src) 99 F (37.2 C) (Oral)  Resp 19  Wt 123 lb 7.3 oz (56 kg)  SpO2 98%  Elderly female in no acute distress. HEENT: Atraumatic, normocephalic extraocular muscles are intact Neck is supple Chest is clear to auscultation Cardiac exam S1 and S2 are irregular Abdominal exam active bowel sounds, soft Extremities without edema  Assessment and plan: Medical Problem List and Plan: 1. Functional deficits secondary to right temporal intraparenchymal hemorrhage secondary to hypertensive crisis 2. DVT Prophylaxis/Anticoagulation: Subcutaneous heparin initiated 03/25/2015. Monitor for a bleeding episodes 3. Pain Management: Voltaren gel 4 times a day to affected areas for OA. 4. Seizure prophylaxis. Keppra 500 mg twice a day. EEG negative for seizure 5. Neuropsych: This patient is capable of making decisions on her own behalf.s 6. Skin/Wound Care: Routine skin checks 7. Fluids/Electrolytes/Nutrition: Routine I&O follow-up chemistries 8. Hypertension. Cozaar 100 mg daily. Monitor with increased mobility    134/65-156/72       9. Hypokalemia. resolved  Lab Results  Component Value Date   K 3.6 03/29/2015    10. New bradycardia/ afib: need to monitor HR daily, particularly with increased exertion in therapy.

## 2015-03-30 NOTE — Evaluation (Signed)
Speech Language Pathology Assessment and Plan  Patient Details  Name: [name] MRN: [id number] Date of Birth: [birth year]  SLP Diagnosis: Cognitive Impairments;Dysphagia  Rehab Potential: Excellent ELOS: 14-17 days     Today's Date: 03/30/2015 SLP Individual Time: 1100-1200 SLP Individual Time Calculation (min): 60 min   Problem List:  Patient Active Problem List   Diagnosis Date Noted  . Atrial fibrillation 03/29/2015  . UTI (urinary tract infection) 03/28/2015  . Altered mental status   . Lethargy   . Leukocytosis   . ICH (intracerebral hemorrhage)   . Seizures   . Brain bleed   . Cerebral hemorrhage   . Cytotoxic brain edema 03/22/2015  . Brain herniation 03/22/2015  . Cerebral parenchymal hemorrhage 03/19/2015  . Dizzy spells 11/10/2014  . SPINAL STENOSIS, CERVICAL 06/11/2010  . Essential hypertension 06/22/2006  . Arthritis of right knee 06/22/2006  . Osteopenia 06/22/2006   Past Medical History:  Past Medical History  Diagnosis Date  . Hypertension   . Osteopenia    Past Surgical History:  Past Surgical History  Procedure Laterality Date  . Cataract extraction      Assessment / Plan / Recommendation Clinical Impression Patient is a 81 y.o. right handed female with history of hypertension. Independent prior to admission without assistive device and still driving. Presented 03/19/2015 with altered mental status left-sided weakness and slurred speech. Blood pressure in the ED was 140/80. CT of the head and imaging showed a large right temporal intraparenchymal hematoma. Right to left midline shift of 6 mm. Neurosurgery consulted and advised conservative care. Keppra for seizure prophylaxis. Follow-up CT of the brain stable without hydrocephalus. MRA of the head with diffuse small vessel disease. EEG completed consistent with encephalopathy and no seizure activity recorded. Follow-up cranial CT scan 03/27/2015 shows no new hemorrhage or mass effect. Currently  maintained on dysphagia 2 textures with thin liquids. Physical and occupational therapy evaluation completed 03/21/2015 with recommendations of physical medicine rehabilitation consult. Patient was admitted for a comprehensive rehabilitation program on 03/29/15. Patient demonstrates moderate-severe cognitive impairments impacting sustained attention, orientation to date, recall of information, intellectual awareness, functional problem solving and safety. Patient also demonstrates delayed processing. Suspect cognitive function was impacted by lethargy and fatigue. Patient was also administered a BSE and consumed thin liquids via cup without overt s/s of aspiration but appeared to become mildly SOB with large, sequential sips. Patient also demonstrated efficient mastication of Dys. 2 textures and mildly prolonged mastication with Dys. 3 textures without overt s/s of aspiration. Recommend patient continue current diet until her overall fatigue and lethargy improve. Patient would benefit from skilled SLP intervention to maximize cognitive and swallowing function in order to maximize her functional independence prior to discharge.   Skilled Therapeutic Interventions          Administered a cognitive-linguistic evaluation and BSE. Please see above for details.   SLP Assessment  Patient will need skilled Speech Lanaguage Pathology Services during CIR admission    Recommendations  SLP Diet Recommendations: Dysphagia 2 (Fine chop);Thin Liquid Administration via: Cup;No straw Medication Administration: Whole meds with puree Supervision: Patient able to self feed;Full supervision/cueing for compensatory strategies Compensations: Slow rate;Small sips/bites Postural Changes and/or Swallow Maneuvers: Seated upright 90 degrees Oral Care Recommendations: Oral care BID Patient destination: Home Follow up Recommendations: Home Health SLP;24 hour supervision/assistance Equipment Recommended: None recommended by SLP     SLP Frequency 3 to 5 out of 7 days   SLP Treatment/Interventions Cognitive remediation/compensation;Cueing hierarchy;Environmental controls;Functional tasks;Internal/external aids;Patient/family education;Therapeutic  Activities;Dysphagia/aspiration precaution training    Pain No/Denies Pain  Short Term Goals: Week 1: SLP Short Term Goal 1 (Week 1): Patient will utilize external memory aids to increase orientation with Min A verbal and question cues.  SLP Short Term Goal 2 (Week 1): Patient will sustain attention to a functional task for 5 minutes with Min A verbal cues.  SLP Short Term Goal 3 (Week 1): Patient will utilize call bell to request assistance in 50% of observable opportunities with Mod A question cues.  SLP Short Term Goal 4 (Week 1): Patient will demonstrate functional problem solving for basic and familiar tasks with Mod A verbal cues.  SLP Short Term Goal 5 (Week 1): Patient will consume current diet with minimal overt s/s of aspiration with Min A verbal cues for use of swallowing compensatory strategies.  SLP Short Term Goal 6 (Week 1): Patient will demonstrate efficient mastication of Dys. 3 textures without overt s/s of aspiration over 2 consecutive sessions with Min A verbal cues.   See FIM for current functional status Refer to Care Plan for Long Term Goals  Recommendations for other services: Neuropsych  Discharge Criteria: Patient will be discharged from SLP if patient refuses treatment 3 consecutive times without medical reason, if treatment goals not met, if there is a change in medical status, if patient makes no progress towards goals or if patient is discharged from hospital.  The above assessment, treatment plan, treatment alternatives and goals were discussed and mutually agreed upon: by patient  Orell Hurtado 03/30/2015, 3:39 PM

## 2015-03-30 NOTE — Progress Notes (Signed)
Pt admitted to our unit with Foley in place. Swords MD called and stated to leave Foley in for now and to d/c Monday morning. Will continue to monitor.

## 2015-03-31 ENCOUNTER — Inpatient Hospital Stay (HOSPITAL_COMMUNITY): Payer: Medicare Other | Admitting: Physical Therapy

## 2015-03-31 LAB — URINALYSIS, ROUTINE W REFLEX MICROSCOPIC
Bilirubin Urine: NEGATIVE
Glucose, UA: NEGATIVE mg/dL
Ketones, ur: NEGATIVE mg/dL
NITRITE: NEGATIVE
PH: 6.5 (ref 5.0–8.0)
PROTEIN: NEGATIVE mg/dL
Specific Gravity, Urine: 1.013 (ref 1.005–1.030)
Urobilinogen, UA: 0.2 mg/dL (ref 0.0–1.0)

## 2015-03-31 LAB — URINE MICROSCOPIC-ADD ON

## 2015-03-31 NOTE — Progress Notes (Signed)
Subjective:  Patient states "I just don't know how I feel". "I'm cold".She denies pain. She would like to get up and start moving around.  Objective: BP 115/63 mmHg  Pulse 85  Temp(Src) 98.3 F (36.8 C) (Oral)  Resp 16  Ht 5\' 3"  (1.6 m)  Wt 158 lb (71.668 kg)  BMI 28.00 kg/m2  SpO2 98%  Elderly female in no acute distress. HEENT: Atraumatic, normocephalic extraocular muscles are intact Neck is supple Chest is clear to auscultation Cardiac exam S1 and S2 are irregular Abdominal exam active bowel sounds, soft Extremities without edema Alert, cooperative  Assessment and plan: Medical Problem List and Plan: 1. Functional deficits secondary to right temporal intraparenchymal hemorrhage secondary to hypertensive crisis 2. DVT Prophylaxis/Anticoagulation: Subcutaneous heparin initiated 03/25/2015. Monitor for a bleeding episodes 3. Pain Management: Voltaren gel 4 times a day to affected areas for OA. 4. Seizure prophylaxis. Keppra 500 mg twice a day. EEG negative for seizure 5. Neuropsych: This patient is capable of making decisions on her own behalf.s 6. Skin/Wound Care: Routine skin checks 7. Fluids/Electrolytes/Nutrition: Routine I&O follow-up chemistries 8. Hypertension. Cozaar 100 mg daily. Monitor with increased mobility    134/65-156/72       9. Hypokalemia. resolved  Lab Results  Component Value Date   K 3.6 03/29/2015    10. New bradycardia/ afib: need to monitor HR daily, particularly with increased exertion in therapy.   Cont w/current Rx

## 2015-03-31 NOTE — Progress Notes (Signed)
Physical Therapy Session Note  Patient Details  Name: [name] MRN: [id number] Date of Birth: 05/15/1918  Today's Date: 03/31/2015 PT Individual Time: 0900-1000 PT Individual Time Calculation (min): 60 min   Short Term Goals: Week 1:  PT Short Term Goal 1 (Week 1): Pt will perform bed mobility with Mod A PT Short Term Goal 2 (Week 1): Pt will perform transfers with Mod A PT Short Term Goal 3 (Week 1): Pt will ambulate 13' with LRAD and Max A PT Short Term Goal 4 (Week 1): Pt will perform w/c propulsion 55' with Max A  Skilled Therapeutic Interventions/Progress Updates:   Pt demonstrates improvement requiring decreased assist for transfers in session. Pt with low amplitude movement strategies noted in session, benefiting from large amplitude strategies in session. Pt does best with short bursts of activity followed by frequent rest breaks due to fatigue. Pt would continue to benefit from skilled PT services to increase functional mobility.  Therapy Documentation Precautions:  Precautions Precautions: Fall Precaution Comments: Seizure precautions Restrictions Weight Bearing Restrictions: No Pain: Pain Assessment Pain Assessment: Faces Pain Score: 3  Pain Location: Knee Pain Orientation: Right Pain Intervention(s):  (graded activity) Mobility:  Transfers with cues for weight shift, hand placement, and amplitude Locomotion : Ambulation Ambulation/Gait Assistance: 3: Mod assist 20'x1, 5'x3 with cues for gait velocity and AD Other Treatments:  Pt performs bed mobility with dual motor activities within functional context. Pt performs transfers x12 in session. Pt performs large amplitude transfers x5. Pt performs static standing 1'x5 in session. Unsupported sitting as active rest. Standing marching 2x10. Anterior weight shifts, hip abd/add AROM, standing weight shifts B/L 2x10.   See FIM for current functional status  Therapy/Group: Individual Therapy  Monia Pouch 03/31/2015, 9:42 AM

## 2015-04-01 ENCOUNTER — Inpatient Hospital Stay (HOSPITAL_COMMUNITY): Payer: Medicare Other | Admitting: Physical Therapy

## 2015-04-01 ENCOUNTER — Inpatient Hospital Stay (HOSPITAL_COMMUNITY): Payer: Medicare Other | Admitting: Speech Pathology

## 2015-04-01 ENCOUNTER — Inpatient Hospital Stay (HOSPITAL_COMMUNITY): Payer: Medicare Other

## 2015-04-01 DIAGNOSIS — I610 Nontraumatic intracerebral hemorrhage in hemisphere, subcortical: Secondary | ICD-10-CM

## 2015-04-01 LAB — COMPREHENSIVE METABOLIC PANEL
ALBUMIN: 2.3 g/dL — AB (ref 3.5–5.0)
ALK PHOS: 87 U/L (ref 38–126)
ALT: 144 U/L — ABNORMAL HIGH (ref 14–54)
AST: 58 U/L — AB (ref 15–41)
Anion gap: 9 (ref 5–15)
BUN: 19 mg/dL (ref 6–20)
CO2: 26 mmol/L (ref 22–32)
Calcium: 8.5 mg/dL — ABNORMAL LOW (ref 8.9–10.3)
Chloride: 103 mmol/L (ref 101–111)
Creatinine, Ser: 0.84 mg/dL (ref 0.44–1.00)
GFR, EST NON AFRICAN AMERICAN: 57 mL/min — AB (ref >60)
Glucose, Bld: 125 mg/dL — ABNORMAL HIGH (ref 65–99)
POTASSIUM: 4 mmol/L (ref 3.5–5.1)
Sodium: 138 mmol/L (ref 135–145)
Total Bilirubin: 0.4 mg/dL (ref 0.3–1.2)
Total Protein: 5.8 g/dL — ABNORMAL LOW (ref 6.5–8.1)

## 2015-04-01 LAB — CBC WITH DIFFERENTIAL/PLATELET
BASOS PCT: 0 % (ref 0–1)
Basophils Absolute: 0 10*3/uL (ref 0.0–0.1)
EOS ABS: 0.1 10*3/uL (ref 0.0–0.7)
Eosinophils Relative: 1 % (ref 0–5)
HCT: 31.9 % — ABNORMAL LOW (ref 36.0–46.0)
Hemoglobin: 10.6 g/dL — ABNORMAL LOW (ref 12.0–15.0)
LYMPHS ABS: 1.4 10*3/uL (ref 0.7–4.0)
LYMPHS PCT: 13 % (ref 12–46)
MCH: 30 pg (ref 26.0–34.0)
MCHC: 33.2 g/dL (ref 30.0–36.0)
MCV: 90.4 fL (ref 78.0–100.0)
Monocytes Absolute: 0.9 10*3/uL (ref 0.1–1.0)
Monocytes Relative: 9 % (ref 3–12)
NEUTROS ABS: 7.9 10*3/uL — AB (ref 1.7–7.7)
NEUTROS PCT: 77 % (ref 43–77)
Platelets: 313 10*3/uL (ref 150–400)
RBC: 3.53 MIL/uL — AB (ref 3.87–5.11)
RDW: 13.1 % (ref 11.5–15.5)
WBC: 10.3 10*3/uL (ref 4.0–10.5)

## 2015-04-01 LAB — CULTURE, BLOOD (ROUTINE X 2)
CULTURE: NO GROWTH
Culture: NO GROWTH

## 2015-04-01 LAB — URINE CULTURE: Culture: NO GROWTH

## 2015-04-01 MED ORDER — LEVETIRACETAM 250 MG PO TABS
250.0000 mg | ORAL_TABLET | Freq: Two times a day (BID) | ORAL | Status: DC
  Administered 2015-04-02 – 2015-04-04 (×6): 250 mg via ORAL
  Filled 2015-04-01 (×10): qty 1

## 2015-04-01 MED ORDER — LIDOCAINE HCL 2 % EX GEL
CUTANEOUS | Status: DC | PRN
  Filled 2015-04-01: qty 5

## 2015-04-01 NOTE — Plan of Care (Signed)
Problem: RH BOWEL ELIMINATION Goal: RH STG MANAGE BOWEL WITH ASSISTANCE STG Manage Bowel with Assistance. Mod I  Outcome: Not Progressing Incontinent of stool requiring total assist. Goal: RH STG MANAGE BOWEL W/MEDICATION W/ASSISTANCE STG Manage Bowel with Medication with Assistance. Mod I  Outcome: Progressing Total assist with care and meds.  Problem: RH BLADDER ELIMINATION Goal: RH STG MANAGE BLADDER WITH ASSISTANCE STG Manage Bladder With Assistance. Mod I  Outcome: Not Applicable Date Met:  79/02/40 Patient has foley.

## 2015-04-01 NOTE — Progress Notes (Signed)
Social Work Patient ID: [name], female   DOB: [birth year], 81 y.o.   MRN: [id number]   No family present today and pt unable to complete assessment interview.  Contacted pt's daughter, [name], this afternoon and will plan to meet with her and pt tomorrow morning @ 10:00 to complete assessment.  Note to follow.  Daphne Karrer, LCSW

## 2015-04-01 NOTE — Progress Notes (Signed)
Physical Therapy Session Note  Patient Details  Name: [name] MRN: [id number] Date of Birth: [birth year]  Today's Date: 04/01/2015 PT Individual Time: 1400-1500 PT Individual Time Calculation (min): 60 min   Short Term Goals: Week 1:  PT Short Term Goal 1 (Week 1): Pt will perform bed mobility with Mod A PT Short Term Goal 2 (Week 1): Pt will perform transfers with Mod A PT Short Term Goal 3 (Week 1): Pt will ambulate 46' with LRAD and Max A PT Short Term Goal 4 (Week 1): Pt will perform w/c propulsion 41' with Max A  Skilled Therapeutic Interventions/Progress Updates:    W/C Management: Pt instructed in w/c propulsion towards therapy gym. Pt able to propel w/c x70' with min A and max verbal cues with 3 rest breaks. PT instructed patient in management of w/c brakes. Pt able to unlock R brake with max cues and mod A. Unable to manage L brake or leg rests at this time.   Therapeutic Activity: Pt instructed patient in stand>pivot transfers from w/c<>bed and w/c<>toilet. Pt requires max A to rise and mod A to pivot to surface. PT instructed patient in LE activity to increase amplitude of LE movement with cone tapping (2x5 bilaterally), ball tapping (2x5 bilaterally), and reaching target with hip flexion and LAQ (2x10 bilaterally). PT instructs patient in sit<>stand transfer from mat with RW and mod A to rise. Max verbal cues for hand placement, forward weight shift, and erect posture once in standing. Pt able to stand x2 minutes before requiring rest break. Repeated sit<>stand and patient able to take 5 steps with RW and mod A, max verbal cues for increasing step length and erect posture. Pt returned to bed at end of session with mod A to transition sit>supine. Positioned in R sidelying with needs in reach.   Therapy Documentation Precautions:  Precautions Precautions: Fall Precaution Comments: Seizure precautions Restrictions Weight Bearing Restrictions: No  Pain: Pain Assessment Pain  Assessment: No/denies pain  Locomotion : Ambulation Ambulation/Gait Assistance: 3: Mod assist Wheelchair Mobility Distance: 70    See FIM for current functional status  Therapy/Group: Individual Therapy  Earnest Conroy Penven-Crew 04/01/2015, 4:02 PM

## 2015-04-01 NOTE — Plan of Care (Signed)
Problem: RH SAFETY Goal: RH STG ADHERE TO SAFETY PRECAUTIONS W/ASSISTANCE/DEVICE STG Adhere to Safety Precautions With Assistance/Device. Supervision  Outcome: Not Progressing A & O to self only.  Problem: RH COGNITION-NURSING Goal: RH STG USES MEMORY AIDS/STRATEGIES W/ASSIST TO PROBLEM SOLVE STG Uses Memory Aids/Strategies With Assistance to Problem Solve. Min A  Outcome: Not Progressing Confused, needing max cues and assistance.  Problem: RH KNOWLEDGE DEFICIT Goal: RH STG INCREASE KNOWLEDGE OF DYSPHAGIA/FLUID INTAKE Outcome: Not Progressing Poor PO intake and limited carry over of info.

## 2015-04-01 NOTE — IPOC Note (Signed)
Overall Plan of Care [facility]) Patient Details Name: [name] MRN: [id number] DOB: 04-04-1918  Admitting Diagnosis: R T P ICH  Hospital Problems: Active Problems:   Essential hypertension   Cerebral parenchymal hemorrhage   ICH (intracerebral hemorrhage)   Leukocytosis   Atrial fibrillation     Functional Problem List: Nursing Bowel, Bladder, Pain, Perception  PT Balance, Motor, Skin Integrity, Pain, Safety, Endurance  OT Balance, Pain, Cognition, Safety, Endurance, Motor  SLP Cognition  TR         Basic ADL's: OT Grooming, Bathing, Dressing, Toileting     Advanced  ADL's: OT  (n/a)     Transfers: PT Bed Mobility, Bed to Chair, Car, Furniture, Floor  OT Toilet, Metallurgist: PT Ambulation, Emergency planning/management officer, Stairs     Additional Impairments: OT None  SLP Swallowing, Personnel officer, Awareness, Problem Solving, Memory, Attention  TR      Anticipated Outcomes Item Anticipated Outcome  Self Feeding n/a  Swallowing  Supervision with least restrictive diet   Basic self-care  supervision - min A  Toileting  Min A   Bathroom Transfers Min A  Bowel/Bladder  Mod I  Transfers  CGA  Locomotion  CGA 150' with LRAD  Communication     Cognition  Min A   Pain  <4  Safety/Judgment  Supervision   Therapy Plan: PT Intensity: Minimum of 1-2 x/day ,45 to 90 minutes PT Frequency: 5 out of 7 days PT Duration Estimated Length of Stay: 3 weeks OT Intensity: Minimum of 1-2 x/day, 45 to 90 minutes OT Frequency: Total of 15 hours over 7 days of combined therapies OT Duration/Estimated Length of Stay: 19-21 days SLP Intensity: Minumum of 1-2 x/day, 30 to 90 minutes SLP Frequency: 3 to 5 out of 7 days SLP Duration/Estimated Length of Stay: 14-17 days        Team Interventions: Nursing Interventions Patient/Family Education, Disease Management/Prevention, Discharge Planning  PT interventions Ambulation/gait training,  DME/adaptive equipment instruction, Psychosocial support, UE/LE Strength taining/ROM, Training and development officer, Functional electrical stimulation, Cognitive remediation/compensation, Functional mobility training, Neuromuscular re-education, Community reintegration, IT trainer, UE/LE Coordination activities, Wheelchair propulsion/positioning, Discharge planning, Pain management, Therapeutic Activities, Disease management/prevention, Barrister's clerk education, Therapeutic Exercise  OT Interventions Training and development officer, Academic librarian, Barrister's clerk education, Self Care/advanced ADL retraining, UE/LE Coordination activities, Therapeutic Exercise, Discharge planning, DME/adaptive equipment instruction, Functional mobility training, Therapeutic Activities, UE/LE Strength taining/ROM  SLP Interventions Cognitive remediation/compensation, English as a second language teacher, Environmental controls, Functional tasks, Internal/external aids, Patient/family education, Therapeutic Activities, Dysphagia/aspiration precaution training  TR Interventions    SW/CM Interventions      Team Discharge Planning: Destination: PT-Skilled Nursing Facility (SNF) ,OT- Home (home vs SNF) , SLP-Home Projected Follow-up: PT-Skilled nursing facility, Home health PT, 24 hour supervision/assistance, OT-  24 hour supervision/assistance, Home health OT, Skilled nursing facility, SLP-Home Health SLP, 24 hour supervision/assistance Projected Equipment Needs: PT- , OT- To be determined, SLP-None recommended by SLP Equipment Details: PT-RW, bedside commode if home, OT-  Patient/family involved in discharge planning: PT-  ,  OT-Patient, SLP-Patient  MD ELOS: 10-17d Medical Rehab Prognosis:  Good Assessment: 81 y.o. right handed female with history of hypertension. Independent prior to admission without assistive device and still driving. Presented 03/19/2015 with altered mental status left-sided weakness and slurred speech. Blood  pressure in the ED was 140/80. CT of the head and imaging showed a large right temporal intraparenchymal hematoma. Right to left midline shift of 6 mm. Troponin 0.4 felt to be related to  demand ischemia and no other work up indicated. Neurosurgery consulted and advised conservative care.Keppra for seizure prophylaxis. Follow-up CT of the brain stable without hydrocephalus. MRA of the head with diffuse small vessel disease   Now requiring 24/7 Rehab RN,MD, as well as CIR level PT, OT and SLP.  Treatment team will focus on ADLs and mobility with goals set at [facility] A  See Team Conference Notes for weekly updates to the plan of care

## 2015-04-01 NOTE — Progress Notes (Signed)
Poor intake. Drowsy, but easily aroused. "I'm fine." A & O to self only.  Incontinent of stool, last 2 nights, stool soft to loose. Scheduled senna held at HS. HRIR. [name] A

## 2015-04-01 NOTE — Progress Notes (Signed)
Speech Language Pathology Daily Session Note  Patient Details  Name: [name] MRN: [id number] Date of Birth: [birth year]  Today's Date: 04/01/2015 SLP Individual Time: 1601-0932 SLP Individual Time Calculation (min): 60 min  Short Term Goals: Week 1: SLP Short Term Goal 1 (Week 1): Patient will utilize external memory aids to increase orientation with Min A verbal and question cues.  SLP Short Term Goal 2 (Week 1): Patient will sustain attention to a functional task for 5 minutes with Min A verbal cues.  SLP Short Term Goal 3 (Week 1): Patient will utilize call bell to request assistance in 50% of observable opportunities with Mod A question cues.  SLP Short Term Goal 4 (Week 1): Patient will demonstrate functional problem solving for basic and familiar tasks with Mod A verbal cues.  SLP Short Term Goal 5 (Week 1): Patient will consume current diet with minimal overt s/s of aspiration with Min A verbal cues for use of swallowing compensatory strategies.  SLP Short Term Goal 6 (Week 1): Patient will demonstrate efficient mastication of Dys. 3 textures without overt s/s of aspiration over 2 consecutive sessions with Min A verbal cues.   Skilled Therapeutic Interventions: Skilled treatment session focused on addressing cognition goals during diagnostic treatment. Patient participated in the MoCA for further assessment of functional abilities.  SLP facilitated session by providing Max verbal, instructional cues to complete executive functioning tasks, due to observed difficulty with initiation and perseveration.  Deficits with functional tasks were greater than verbal.  SLP also facilitated session with Mod verbal and visual cues to sustain attention to given tasks for 1-2 minutes.  Recommend continuation of the MoCA during tomorrow's session.  Continue with current plan of care.   FIM:  Comprehension Comprehension Mode: Auditory Comprehension: 3-Understands basic 50 - 74% of the time/requires  cueing 25 - 50%  of the time Expression Expression Mode: Verbal Expression: 3-Expresses basic 50 - 74% of the time/requires cueing 25 - 50% of the time. Needs to repeat parts of sentences. Social Interaction Social Interaction: 4-Interacts appropriately 75 - 89% of the time - Needs redirection for appropriate language or to initiate interaction. Problem Solving Problem Solving: 2-Solves basic 25 - 49% of the time - needs direction more than half the time to initiate, plan or complete simple activities Memory Memory: 2-Recognizes or recalls 25 - 49% of the time/requires cueing 51 - 75% of the time  Pain Pain Assessment Pain Assessment: No/denies pain  Therapy/Group: Individual Therapy  Carmelia Roller., [place] [phone]  [place] 04/01/2015, 12:07 PM

## 2015-04-01 NOTE — Progress Notes (Signed)
Patient information reviewed and entered into eRehab system by Jacob Chamblee, RN, CRRN, PPS Coordinator.  Information including medical coding and functional independence measure will be reviewed and updated through discharge.     Per nursing patient was given "Data Collection Information Summary for Patients in Inpatient Rehabilitation Facilities with attached "Privacy Act Statement-Health Care Records" upon admission.  

## 2015-04-01 NOTE — Progress Notes (Signed)
Occupational Therapy Session Note  Patient Details  Name: [name] MRN: [id number] Date of Birth: 1918-06-26  Today's Date: 04/01/2015 OT Individual Time: 1030-1200 OT Individual Time Calculation (min): 90 min   Short Term Goals: Week 1:  OT Short Term Goal 1 (Week 1): Pt will perform sit <>stand with mod A during self care. OT Short Term Goal 2 (Week 1): Pt will perform shower transfer with mod A in order to increase I in functional transfers. OT Short Term Goal 3 (Week 1): Pt will perform LB dressing with max A in order to decrease level of assist with self care.  OT Short Term Goal 4 (Week 1): Pt will perform UB dressing with min A in order to increase level of assist with self care.   Skilled Therapeutic Interventions/Progress Updates: ADL-retraining with focus on bed mobility, transfer, w/c ,managment, and seated grooming.    Pt received supine in bed, lethargic and complaining of weakness.   Pt requires max vc and extra time to process directions and reorient to situation.   With max vc to initiate mobility, pt completes bed mobility with max assist to lift legs and trunk.   Pt maintains sitting balance with min assist and proceeds through stand-pivot transfer to w/c with max assist (pt=60% for lift and 80% for lower).   Pt defers bathing or dressing but elects to goom at sink, combing her hair and washing her face and hands only.   Pt continues to require max vc to engage in self-care tasks d/t perseveration on injury, decreased initiation, and decreased intellectual awareness of deficits or need for activity.   W/C replaced with smaller wheels to improve stability while sitting during self-care tasks.  Pt requires max assist for w/c mobility.   Pt left in w/c at end of session in prep for noon meal with all needs within reach.     Therapy Documentation Precautions:  Precautions Precautions: Fall Precaution Comments: Seizure precautions Restrictions Weight Bearing Restrictions: No    Pain: Pain Assessment Pain Assessment: No/denies pain   See FIM for current functional status  Therapy/Group: Individual Therapy  Nafisah Runions 04/01/2015, 1:47 PM

## 2015-04-01 NOTE — Progress Notes (Addendum)
81 y.o. right handed female with history of hypertension. Independent prior to admission without assistive device and still driving. Presented 03/19/2015 with altered mental status left-sided weakness and slurred speech. Blood pressure in the ED was 140/80. CT of the head and imaging showed a large right temporal intraparenchymal hematoma. Right to left midline shift of 6 mm. Troponin 0.4 felt to be related to demand ischemia and no other work up indicated. Neurosurgery consulted and advised conservative care.Keppra for seizure prophylaxis. Follow-up CT of the brain stable without hydrocephalus. MRA of the head with diffuse small vessel disease. EEG completed consistent with encephalopathy and no seizure activity recorded. Echocardiogram with ejection fraction of 70% no wall motion abnormalities. Subcutaneous heparin for DVT prophylaxis initiated 03/25/2015. Follow-up cranial CT scan 03/27/2015 shows no new hemorrhage or mass effect. Currently maintained on the dysphagia #2 thin liquids..   Subjective/Complaints: Family concerned that pt is overly sedated Pt states she sleeps well but wakes up tired  ROS  No breathing issues, no pain , no HA Objective: Vital Signs: Blood pressure 111/66, pulse 79, temperature 98.1 F (36.7 C), temperature source Oral, resp. rate 16, height _0  (1.6 m), weight 71.668 kg (158 lb), SpO2 96 %. No results found. Results for orders placed or performed during the hospital encounter of 03/29/15 (from the past 72 hour(s))  CBC     Status: Abnormal   Collection Time: 03/29/15  4:58 PM  Result Value Ref Range   WBC 10.8 (H) 4.0 - 10.5 K/uL   RBC 3.38 (L) 3.87 - 5.11 MIL/uL   Hemoglobin 10.5 (L) 12.0 - 15.0 g/dL   HCT 31.5 (L) 36.0 - 46.0 %   MCV 93.2 78.0 - 100.0 fL   MCH 31.1 26.0 - 34.0 pg   MCHC 33.3 30.0 - 36.0 g/dL   RDW 13.2 11.5 - 15.5 %   Platelets 255 150 - 400 K/uL  Creatinine, serum     Status: None   Collection Time: 03/29/15  4:58 PM  Result Value Ref  Range   Creatinine, Ser 0.72 0.44 - 1.00 mg/dL   GFR calc non Af Amer >60 >60 mL/min   GFR calc Af Amer >60 >60 mL/min    Comment: (NOTE) The eGFR has been calculated using the CKD EPI equation. This calculation has not been validated in all clinical situations. eGFR's persistently <60 mL/min signify possible Chronic Kidney Disease.   Urinalysis, Routine w reflex microscopic (not at [facility])     Status: Abnormal   Collection Time: 03/31/15  2:28 PM  Result Value Ref Range   Color, Urine YELLOW YELLOW   APPearance CLEAR CLEAR   Specific Gravity, Urine 1.013 1.005 - 1.030   pH 6.5 5.0 - 8.0   Glucose, UA NEGATIVE NEGATIVE mg/dL   Hgb urine dipstick TRACE (A) NEGATIVE   Bilirubin Urine NEGATIVE NEGATIVE   Ketones, ur NEGATIVE NEGATIVE mg/dL   Protein, ur NEGATIVE NEGATIVE mg/dL   Urobilinogen, UA 0.2 0.0 - 1.0 mg/dL   Nitrite NEGATIVE NEGATIVE   Leukocytes, UA SMALL (A) NEGATIVE  Urine microscopic-add on     Status: None   Collection Time: 03/31/15  2:28 PM  Result Value Ref Range   WBC, UA 3-6 <3 WBC/hpf   RBC / HPF 3-6 <3 RBC/hpf   Bacteria, UA RARE RARE   Urine-Other FEW YEAST   CBC WITH DIFFERENTIAL     Status: Abnormal   Collection Time: 04/01/15  6:12 AM  Result Value Ref Range   WBC 10.3 4.0 -  10.5 K/uL   RBC 3.53 (L) 3.87 - 5.11 MIL/uL   Hemoglobin 10.6 (L) 12.0 - 15.0 g/dL   HCT 31.9 (L) 36.0 - 46.0 %   MCV 90.4 78.0 - 100.0 fL   MCH 30.0 26.0 - 34.0 pg   MCHC 33.2 30.0 - 36.0 g/dL   RDW 13.1 11.5 - 15.5 %   Platelets 313 150 - 400 K/uL   Neutrophils Relative % 77 43 - 77 %   Neutro Abs 7.9 (H) 1.7 - 7.7 K/uL   Lymphocytes Relative 13 12 - 46 %   Lymphs Abs 1.4 0.7 - 4.0 K/uL   Monocytes Relative 9 3 - 12 %   Monocytes Absolute 0.9 0.1 - 1.0 K/uL   Eosinophils Relative 1 0 - 5 %   Eosinophils Absolute 0.1 0.0 - 0.7 K/uL   Basophils Relative 0 0 - 1 %   Basophils Absolute 0.0 0.0 - 0.1 K/uL  Comprehensive metabolic panel     Status: Abnormal   Collection Time:  04/01/15  6:12 AM  Result Value Ref Range   Sodium 138 135 - 145 mmol/L   Potassium 4.0 3.5 - 5.1 mmol/L   Chloride 103 101 - 111 mmol/L   CO2 26 22 - 32 mmol/L   Glucose, Bld 125 (H) 65 - 99 mg/dL   BUN 19 6 - 20 mg/dL   Creatinine, Ser 0.84 0.44 - 1.00 mg/dL   Calcium 8.5 (L) 8.9 - 10.3 mg/dL   Total Protein 5.8 (L) 6.5 - 8.1 g/dL   Albumin 2.3 (L) 3.5 - 5.0 g/dL   AST 58 (H) 15 - 41 U/L   ALT 144 (H) 14 - 54 U/L   Alkaline Phosphatase 87 38 - 126 U/L   Total Bilirubin 0.4 0.3 - 1.2 mg/dL   GFR calc non Af Amer 57 (L) >60 mL/min   GFR calc Af Amer >60 >60 mL/min    Comment: (NOTE) The eGFR has been calculated using the CKD EPI equation. This calculation has not been validated in all clinical situations. eGFR's persistently <60 mL/min signify possible Chronic Kidney Disease.    Anion gap 9 5 - 15     HEENT: normal Cardio: RRR and no murmur Resp: CTA B/L and unlabored GI: BS positive and NT, ND Extremity:  No Edema Skin:   Intact Neuro: Flat, Abnormal Sensory parathesia to LT in RLE and Normal Motor Musc/Skel:  Other Bilateral Hallux valgus, severe Gen NAD   Assessment/Plan: 1. Functional deficits secondary to  Left Temporal ICH due to hypertensive crisis which require 3+ hours per day of interdisciplinary therapy in a comprehensive inpatient rehab setting. Physiatrist is providing close team supervision and 24 hour management of active medical problems listed below. Physiatrist and rehab team continue to assess barriers to discharge/monitor patient progress toward functional and medical goals. FIM: FIM - Bathing Bathing Steps Patient Completed: Chest, Right Arm, Left Arm, Abdomen Bathing: 2: Max-Patient completes 3-4 68f10 parts or 25-49%  FIM - Upper Body Dressing/Undressing Upper body dressing/undressing steps patient completed: Thread/unthread right bra strap, Thread/unthread right sleeve of pullover shirt/dresss, Thread/unthread left sleeve of pullover  shirt/dress, Put head through opening of pull over shirt/dress Upper body dressing/undressing: 3: Mod-Patient completed 50-74% of tasks FIM - Lower Body Dressing/Undressing Lower body dressing/undressing: 1: Total-Patient completed less than 25% of tasks  FIM - Toileting Toileting steps completed by patient: Performs perineal hygiene Toileting: 2: Max-Patient completed 1 of 3 steps  FIM - TRadio producer  Devices: Product manager Transfers: 2-From toilet/BSC: Max A (lift and lower assist), 2-To toilet/BSC: Max A (lift and lower assist)  FIM - Bed/Chair Transfer Bed/Chair Transfer: 2: Supine > Sit: Max A (lifting assist/Pt. 25-49%), 3: Bed > Chair or W/C: Mod A (lift or lower assist), 3: Chair or W/C > Bed: Mod A (lift or lower assist)  FIM - Locomotion: Wheelchair Distance: to and from gym, unable to sequence  Locomotion: Wheelchair: 1: Total Assistance/staff pushes wheelchair (Pt<25%) FIM - Locomotion: Ambulation Locomotion: Ambulation Assistive Devices: Administrator Ambulation/Gait Assistance: 3: Mod assist Locomotion: Ambulation: 1: Travels less than 50 ft with maximal assistance (Pt: 25 - 49%)  Comprehension Comprehension Mode: Auditory Comprehension: 3-Understands basic 50 - 74% of the time/requires cueing 25 - 50%  of the time  Expression Expression Mode: Verbal Expression: 3-Expresses basic 50 - 74% of the time/requires cueing 25 - 50% of the time. Needs to repeat parts of sentences.  Social Interaction Social Interaction: 1-Interacts appropriately less than 25% of the time. May be withdrawn or combative. (drowsy, limited interaction on this shift, thus far.)  Problem Solving Problem Solving: 2-Solves basic 25 - 49% of the time - needs direction more than half the time to initiate, plan or complete simple activities  Memory Memory: 2-Recognizes or recalls 25 - 49% of the time/requires cueing 51 - 75% of the time  Medical Problem List and  Plan: 1. Functional deficits secondary to right temporal intraparenchymal hemorrhage secondary to hypertensive crisis- encephalopathy related to [place], lethargy likely multifactorial including CVA effect , Keppra 2. DVT Prophylaxis/Anticoagulation: Subcutaneous heparin initiated 03/25/2015. Monitor for a bleeding episodes 3. Pain Management: Voltaren gel 4 times a day to affected areas for OA. 4. Seizure prophylaxis. Keppra 500 mg twice a day. EEG negative for seizure, given potential SE will reduce to 275m BID 5. Neuropsych: This patient is capable of making decisions on her own behalf.s 6. Skin/Wound Care: Routine skin checks 7. Fluids/Electrolytes/Nutrition: Routine I&O follow-up chemistries look normal today and no clinical sign of dehydration 8. Hypertension. Cozaar 100 mg daily. Monitor with increased mobility 9. Hypokalemia. resolved 10. New bradycardia/ afib: need to monitor HR daily, particularly with increased exertion in therapy. No anticoag due to hemmorhagic CVA LOS (Days) 3 A FACE TO FACE EVALUATION WAS PERFORMED  KIRSTEINS,ANDREW E 04/01/2015, 9:51 AM

## 2015-04-02 ENCOUNTER — Inpatient Hospital Stay (HOSPITAL_COMMUNITY): Payer: Medicare Other | Admitting: Occupational Therapy

## 2015-04-02 ENCOUNTER — Inpatient Hospital Stay (HOSPITAL_COMMUNITY): Payer: Medicare Other

## 2015-04-02 ENCOUNTER — Inpatient Hospital Stay (HOSPITAL_COMMUNITY): Payer: Medicare Other | Admitting: Speech Pathology

## 2015-04-02 ENCOUNTER — Inpatient Hospital Stay (HOSPITAL_COMMUNITY): Payer: Medicare Other | Admitting: Physical Therapy

## 2015-04-02 DIAGNOSIS — S06350S Traumatic hemorrhage of left cerebrum without loss of consciousness, sequela: Secondary | ICD-10-CM

## 2015-04-02 NOTE — Patient Care Conference (Signed)
Inpatient RehabilitationTeam Conference and Plan of Care Update Date: 04/02/2015   Time: 2:40  PM    Patient Name: [name]      Medical Record Number: [id number]  Date of Birth: 10/21/1917 Sex: Female         Room/Bed: 4W21C/4W21C-01 Payor Info: Payor: [employer] MEDICARE / Plan: [employer] MEDICARE / Product Type: *No Product type* /    Admitting Diagnosis: R T P ICH  Admit Date/Time:  03/29/2015  2:37 PM Admission Comments: No comment available   Primary Diagnosis:  <principal problem not specified> Principal Problem: <principal problem not specified>  Patient Active Problem List   Diagnosis Date Noted  . Atrial fibrillation 03/29/2015  . UTI (urinary tract infection) 03/28/2015  . Altered mental status   . Lethargy   . Leukocytosis   . ICH (intracerebral hemorrhage)   . Seizures   . Brain bleed   . Cerebral hemorrhage   . Cytotoxic brain edema 03/22/2015  . Brain herniation 03/22/2015  . Cerebral parenchymal hemorrhage 03/19/2015  . Dizzy spells 11/10/2014  . SPINAL STENOSIS, CERVICAL 06/11/2010  . Essential hypertension 06/22/2006  . Arthritis of right knee 06/22/2006  . Osteopenia 06/22/2006    Expected Discharge Date: Expected Discharge Date: 04/20/15  Team Members Present: Physician leading conference: Dr. Alysia Penna Social Worker Present: Lennart Pall, LCSW Nurse Present: Heather Roberts, RN PT Present: Raylene Everts, PT OT Present: Salome Spotted, OT;Roanna Epley, COTA;Jennifer Smith, OT SLP Present: Weston Anna, SLP PPS Coordinator present : Daiva Nakayama, RN, CRRN     Current Status/Progress Goal Weekly Team Focus  Medical   very tired, per daughter was more alert on acute  min A goals,   medication mangement   Bowel/Bladder   Incontinent of bowel. LBM 03/31/15. Time toileting with removal of foley  Managed bowel and bladder  Monitor for effectiveness   Swallow/Nutrition/ Hydration   Dys. 2 textures with thin liquids, Mod A  Supervision with least  restrictive diet  alertness and attention to self-feeding    ADL's   Overall Max A for bathing, dressing and toileting, Max-Mod A for transfers, Mod A for grooming  Overall Min A for BADL and transfers, Supervision for grooming  Cognition (orientation, initiation, attention), activity tolerance, transfers, functional mobility, adapted bathing/dressing/toileting skills   Mobility   mod A for transfers, min A for gait with RW, supervision for w/c  min A overall  staying attended to task, endurance, improving LE strength   Communication             Safety/Cognition/ Behavioral Observations  Mod-Max A  Min A  orientation, attention, recall, problem solving    Pain   Scheduled voltaren gel to R knee. Tylenol 650mg  q 6hrs prn  <4  Assess for effectiveness, and nonverbal cues of pain   Skin   CDI  CDI  Encourage turn q 2hrs    Rehab Goals Patient on target to meet rehab goals: Yes *See Care Plan and progress notes for long and short-term goals.  Barriers to Discharge: see above, may have med toxicity    Possible Resolutions to Barriers:  See above, workup pending    Discharge Planning/Teaching Needs:  home with family able to provide 24/7 support if needed      Team Discussion:  All team reports pt is very "slowed down" and only limited ability to participated.  Fatigue and perserveration significant.  MD suspects Keppra may be cause and in process of tapering down but will need input  from neurology to stop.  Currently with min assist goals.  Will re-assess with next conf to see if goals can be upgraded.  Revisions to Treatment Plan:  None   Continued Need for Acute Rehabilitation Level of Care: The patient requires daily medical management by a physician with specialized training in physical medicine and rehabilitation for the following conditions: Daily direction of a multidisciplinary physical rehabilitation program to ensure safe treatment while eliciting the highest outcome that  is of practical value to the patient.: Yes Daily medical management of patient stability for increased activity during participation in an intensive rehabilitation regime.: Yes Daily analysis of laboratory values and/or radiology reports with any subsequent need for medication adjustment of medical intervention for : Neurological problems  Raynald Rouillard 04/02/2015, 9:47 PM

## 2015-04-02 NOTE — Progress Notes (Signed)
Subjective/Complaints: "I feel tired" Sleeps ok Propelling WC down the hall  ROS  No breathing issues, no pain , no HA Objective: Vital Signs: Blood pressure 133/75, pulse 81, temperature 98 F (36.7 C), temperature source Oral, resp. rate 16, height _0  (1.6 m), weight 71.668 kg (158 lb), SpO2 97 %. No results found. Results for orders placed or performed during the hospital encounter of 03/29/15 (from the past 72 hour(s))  Urinalysis, Routine w reflex microscopic (not at [facility])     Status: Abnormal   Collection Time: 03/31/15  2:28 PM  Result Value Ref Range   Color, Urine YELLOW YELLOW   APPearance CLEAR CLEAR   Specific Gravity, Urine 1.013 1.005 - 1.030   pH 6.5 5.0 - 8.0   Glucose, UA NEGATIVE NEGATIVE mg/dL   Hgb urine dipstick TRACE (A) NEGATIVE   Bilirubin Urine NEGATIVE NEGATIVE   Ketones, ur NEGATIVE NEGATIVE mg/dL   Protein, ur NEGATIVE NEGATIVE mg/dL   Urobilinogen, UA 0.2 0.0 - 1.0 mg/dL   Nitrite NEGATIVE NEGATIVE   Leukocytes, UA SMALL (A) NEGATIVE  Culture, Urine     Status: None   Collection Time: 03/31/15  2:28 PM  Result Value Ref Range   Specimen Description URINE, CATHETERIZED    Special Requests none    Culture NO GROWTH 1 DAY    Report Status 04/01/2015 FINAL   Urine microscopic-add on     Status: None   Collection Time: 03/31/15  2:28 PM  Result Value Ref Range   WBC, UA 3-6 <3 WBC/hpf   RBC / HPF 3-6 <3 RBC/hpf   Bacteria, UA RARE RARE   Urine-Other FEW YEAST   CBC WITH DIFFERENTIAL     Status: Abnormal   Collection Time: 04/01/15  6:12 AM  Result Value Ref Range   WBC 10.3 4.0 - 10.5 K/uL   RBC 3.53 (L) 3.87 - 5.11 MIL/uL   Hemoglobin 10.6 (L) 12.0 - 15.0 g/dL   HCT 31.9 (L) 36.0 - 46.0 %   MCV 90.4 78.0 - 100.0 fL   MCH 30.0 26.0 - 34.0 pg   MCHC 33.2 30.0 - 36.0 g/dL   RDW 13.1 11.5 - 15.5 %   Platelets 313 150 - 400 K/uL   Neutrophils Relative % 77 43 - 77 %   Neutro Abs 7.9 (H) 1.7 - 7.7 K/uL   Lymphocytes Relative 13 12 - 46  %   Lymphs Abs 1.4 0.7 - 4.0 K/uL   Monocytes Relative 9 3 - 12 %   Monocytes Absolute 0.9 0.1 - 1.0 K/uL   Eosinophils Relative 1 0 - 5 %   Eosinophils Absolute 0.1 0.0 - 0.7 K/uL   Basophils Relative 0 0 - 1 %   Basophils Absolute 0.0 0.0 - 0.1 K/uL  Comprehensive metabolic panel     Status: Abnormal   Collection Time: 04/01/15  6:12 AM  Result Value Ref Range   Sodium 138 135 - 145 mmol/L   Potassium 4.0 3.5 - 5.1 mmol/L   Chloride 103 101 - 111 mmol/L   CO2 26 22 - 32 mmol/L   Glucose, Bld 125 (H) 65 - 99 mg/dL   BUN 19 6 - 20 mg/dL   Creatinine, Ser 0.84 0.44 - 1.00 mg/dL   Calcium 8.5 (L) 8.9 - 10.3 mg/dL   Total Protein 5.8 (L) 6.5 - 8.1 g/dL   Albumin 2.3 (L) 3.5 - 5.0 g/dL   AST 58 (H) 15 - 41 U/L   ALT 144 (  H) 14 - 54 U/L   Alkaline Phosphatase 87 38 - 126 U/L   Total Bilirubin 0.4 0.3 - 1.2 mg/dL   GFR calc non Af Amer 57 (L) >60 mL/min   GFR calc Af Amer >60 >60 mL/min    Comment: (NOTE) The eGFR has been calculated using the CKD EPI equation. This calculation has not been validated in all clinical situations. eGFR's persistently <60 mL/min signify possible Chronic Kidney Disease.    Anion gap 9 5 - 15     HEENT: normal Cardio: RRR and no murmur Resp: CTA B/L and unlabored GI: BS positive and NT, ND Extremity:  No Edema Skin:   Intact Neuro: Flat, Abnormal Sensory parathesia to LT in RLE and Normal Motor Musc/Skel:  Other Bilateral Hallux valgus, severe Gen NAD   Assessment/Plan: 1. Functional deficits secondary to  Left Temporal ICH due to hypertensive crisis which require 3+ hours per day of interdisciplinary therapy in a comprehensive inpatient rehab setting. Physiatrist is providing close team supervision and 24 hour management of active medical problems listed below. Physiatrist and rehab team continue to assess barriers to discharge/monitor patient progress toward functional and medical goals. Team conference today please see physician  documentation under team conference tab, met with team face-to-face to discuss problems,progress, and goals. Formulized individual treatment plan based on medical history, underlying problem and comorbidities. FIM: FIM - Bathing Bathing Steps Patient Completed: Chest, Right Arm, Left Arm, Abdomen Bathing: 2: Max-Patient completes 3-4 94f10 parts or 25-49%  FIM - Upper Body Dressing/Undressing Upper body dressing/undressing steps patient completed: Thread/unthread right bra strap, Thread/unthread right sleeve of pullover shirt/dresss, Thread/unthread left sleeve of pullover shirt/dress, Put head through opening of pull over shirt/dress Upper body dressing/undressing: 3: Mod-Patient completed 50-74% of tasks FIM - Lower Body Dressing/Undressing Lower body dressing/undressing: 1: Total-Patient completed less than 25% of tasks  FIM - Toileting Toileting steps completed by patient: Performs perineal hygiene Toileting: 2: Max-Patient completed 1 of 3 steps  FIM - TRadio producerDevices: Grab bars Toilet Transfers: 2-From toilet/BSC: Max A (lift and lower assist), 2-To toilet/BSC: Max A (lift and lower assist)  FIM - BEngineer, siteAssistive Devices: Arm rests Bed/Chair Transfer: 3: Sit > Supine: Mod A (lifting assist/Pt. 50-74%/lift 2 legs), 3: Bed > Chair or W/C: Mod A (lift or lower assist), 3: Chair or W/C > Bed: Mod A (lift or lower assist)  FIM - Locomotion: Wheelchair Distance: 70 Locomotion: Wheelchair: 2: Travels 50 - 149 ft with minimal assistance (Pt.>75%) FIM - Locomotion: Ambulation Locomotion: Ambulation Assistive Devices: WAdministratorAmbulation/Gait Assistance: 3: Mod assist Locomotion: Ambulation: 1: Travels less than 50 ft with moderate assistance (Pt: 50 - 74%)  Comprehension Comprehension Mode: Auditory Comprehension: 3-Understands basic 50 - 74% of the time/requires cueing 25 - 50%  of the  time  Expression Expression Mode: Verbal Expression: 3-Expresses basic 50 - 74% of the time/requires cueing 25 - 50% of the time. Needs to repeat parts of sentences.  Social Interaction Social Interaction: 4-Interacts appropriately 75 - 89% of the time - Needs redirection for appropriate language or to initiate interaction.  Problem Solving Problem Solving: 2-Solves basic 25 - 49% of the time - needs direction more than half the time to initiate, plan or complete simple activities  Memory Memory: 2-Recognizes or recalls 25 - 49% of the time/requires cueing 51 - 75% of the time  Medical Problem List and Plan: 1. Functional deficits secondary to right temporal intraparenchymal  hemorrhage secondary to hypertensive crisis- encephalopathy related to [place], lethargy likely multifactorial including CVA effect , Keppra reduced but may take a couple days to take effect, consider methylphenidate, no new neuro deficits 2. DVT Prophylaxis/Anticoagulation: Subcutaneous heparin initiated 03/25/2015. Monitor for a bleeding episodes 3. Pain Management: Voltaren gel 4 times a day to affected areas for OA. 4. Seizure prophylaxis. Keppra 500 mg twice a day. EEG negative for seizure, given potential SE will reduce to 231m BID 5. Neuropsych: This patient is capable of making decisions on her own behalf.s 6. Skin/Wound Care: Routine skin checks 7. Fluids/Electrolytes/Nutrition: Routine I&O, protein malnutrition, supplement 8. Hypertension. Cozaar 100 mg daily. Monitor with increased mobility 9. Hypokalemia. resolved 10. New bradycardia/ afib: need to monitor HR daily, particularly with increased exertion in therapy. No anticoag (except Subq hep for DVT prophyl) due to hemmorhagic CVA LOS (Days) 4 A FACE TO FACE EVALUATION WAS PERFORMED  KIRSTEINS,ANDREW E 04/02/2015, 9:32 AM

## 2015-04-02 NOTE — Progress Notes (Signed)
Physical Therapy Session Note  Patient Details  Name: [name] MRN: [id number] Date of Birth: 1917/09/02  Today's Date: 04/02/2015 PT Individual Time: 0900-1000 PT Individual Time Calculation (min): 60 min   Short Term Goals: Week 1:  PT Short Term Goal 1 (Week 1): Pt will perform bed mobility with Mod A PT Short Term Goal 2 (Week 1): Pt will perform transfers with Mod A PT Short Term Goal 3 (Week 1): Pt will ambulate 37' with LRAD and Max A PT Short Term Goal 4 (Week 1): Pt will perform w/c propulsion 74' with Max A  Skilled Therapeutic Interventions/Progress Updates:    Therapeutic Activity: Pt participated in dynamic sitting balance without LE support during dressing activities.  Pt transfers bed<>w/c with min A for squat pivot.   W/C: PT educated patient in location of w/c brakes, pt able to unlock with verbal cues and initiate lock with verbal cues. Unable to fully lock w/c brakes 2/2 UE weakness. Pt propels w/c x150' to therapy gym with max verbal cues to stay attended to task. Pt requires verbal cues every 5-10' to continue to propel w/c.Pt perseverating on "white socks."   There-ex: PT instructed patient in LE ther-ex for strengthening and activity tolerance. Pt performed LAQ, seated hip flex, seated hip abd and add 2x10.  Gait Training: 5' and 20' with RW. Min A to facilitate weight shift and upright posture. Pt demos short step length and decreased foot clearance bilaterally.   Overall pt demos decreased arousal with constant need for cuing to stay attended to task. Pt noted to be falling asleep during ther-ex and w/c mobility.   Therapy Documentation Precautions:  Precautions Precautions: Fall Precaution Comments: Seizure precautions Restrictions Weight Bearing Restrictions: No  Pain: Pain Assessment Pain Assessment: No/denies pain   See FIM for current functional status  Therapy/Group: Individual Therapy  Earnest Conroy Penven-Crew 04/02/2015, 9:42 AM

## 2015-04-02 NOTE — Progress Notes (Signed)
Occupational Therapy Session Note  Patient Details  Name: [name] MRN: [id number] Date of Birth: 02/15/1918  Today's Date: 04/02/2015 OT Individual Time: 1430-1450 OT Individual Time Calculation (min): 20 min  OT Missed Time: 10 min   Short Term Goals: Week 1:  OT Short Term Goal 1 (Week 1): Pt will perform sit <>stand with mod A during self care. OT Short Term Goal 2 (Week 1): Pt will perform shower transfer with mod A in order to increase I in functional transfers. OT Short Term Goal 3 (Week 1): Pt will perform LB dressing with max A in order to decrease level of assist with self care.  OT Short Term Goal 4 (Week 1): Pt will perform UB dressing with min A in order to increase level of assist with self care.   Skilled Therapeutic Interventions/Progress Updates:    Pt seen for OT therapy focusing on functional sitting balance, functional activity tolerance, and ADL re-training. Pt in supine upon arrival, voicing fatigue, however, willing to attempt therapy. She transferred to EOB with min A with verbal and tactile cues for technique. Pt sat EOB to don shoes, requiring max VCs for alertness and attending to task. She donned B shoes with assist. Once set-up for transfer to w/c, pt stated that she was unable due to fatigue, and requested return to bed stating " I just can't do any more of this, I need to go to bed". Pt able to doff R shoe, demonstrating ability to reach to floor to untie shoe using only R UE. Assist required to doff L shoe as pt with decreased L LE strength to lift foot from shoe. She required min A to return to supine. Pt left in sidelying per request at end of session, all needs in reach. Pt oriented x4 throughout session, however, was very lethargic throughout, requiring cues to maintain alertness throughout.   Pt educated regarding role of OT and OT goals.   Therapy Documentation Precautions:  Precautions Precautions: Fall Precaution Comments: Seizure  precautions Restrictions Weight Bearing Restrictions: No Pain: Pain Assessment Pain Assessment: No/denies pain  See FIM for current functional status  Therapy/Group: Individual Therapy  Lewis, Jena Tegeler C 04/02/2015, 7:17 AM

## 2015-04-02 NOTE — Progress Notes (Signed)
Speech Language Pathology Daily Session Note  Patient Details  Name: [name] MRN: [id number] Date of Birth: 1918-01-24  Today's Date: 04/02/2015 SLP Individual Time: 1510-1540 SLP Individual Time Calculation (min): 30 min  Short Term Goals: Week 1: SLP Short Term Goal 1 (Week 1): Patient will utilize external memory aids to increase orientation with Min A verbal and question cues.  SLP Short Term Goal 2 (Week 1): Patient will sustain attention to a functional task for 5 minutes with Min A verbal cues.  SLP Short Term Goal 3 (Week 1): Patient will utilize call bell to request assistance in 50% of observable opportunities with Mod A question cues.  SLP Short Term Goal 4 (Week 1): Patient will demonstrate functional problem solving for basic and familiar tasks with Mod A verbal cues.  SLP Short Term Goal 5 (Week 1): Patient will consume current diet with minimal overt s/s of aspiration with Min A verbal cues for use of swallowing compensatory strategies.  SLP Short Term Goal 6 (Week 1): Patient will demonstrate efficient mastication of Dys. 3 textures without overt s/s of aspiration over 2 consecutive sessions with Min A verbal cues.   Skilled Therapeutic Interventions: Skilled treatment session focused on cognitive goals. Upon arrival, patient was awake while supine in bed. SLP facilitated session by finishing administration of the MoCA. Patient scored a 7/30 with a score of 26 or above considered normal.  Patient demonstrated impairments in all areas of the assessment including immediate and short-term memory recall, executive functioning, naming, attention, language, organization and abstract reasoning.  Patient appeared more alert today and demonstrated increased ability to recall basic biographical information with increased awareness of cognitive impairments.  Patient left supine in bed with alarm on and all needs within reach. Continue with current plan of care.    FIM:   Comprehension Comprehension Mode: Auditory Comprehension: 3-Understands basic 50 - 74% of the time/requires cueing 25 - 50%  of the time Expression Expression Mode: Verbal Expression: 3-Expresses basic 50 - 74% of the time/requires cueing 25 - 50% of the time. Needs to repeat parts of sentences. Social Interaction Social Interaction: 3-Interacts appropriately 50 - 74% of the time - May be physically or verbally inappropriate. Problem Solving Problem Solving: 2-Solves basic 25 - 49% of the time - needs direction more than half the time to initiate, plan or complete simple activities Memory Memory: 2-Recognizes or recalls 25 - 49% of the time/requires cueing 51 - 75% of the time FIM - Eating Eating Activity: 5: Supervision/cues  Pain Pain Assessment Pain Assessment: No/denies pain  Therapy/Group: Individual Therapy  Jarris Kortz 04/02/2015, 4:13 PM

## 2015-04-02 NOTE — Progress Notes (Signed)
Occupational Therapy Session Note  Patient Details  Name: [name] MRN: [id number] Date of Birth: 06-15-1918  Today's Date: 04/02/2015 OT Individual Time: 1000-1100 OT Individual Time Calculation (min): 60 min    Short Term Goals: Week 1:  OT Short Term Goal 1 (Week 1): Pt will perform sit <>stand with mod A during self care. OT Short Term Goal 2 (Week 1): Pt will perform shower transfer with mod A in order to increase I in functional transfers. OT Short Term Goal 3 (Week 1): Pt will perform LB dressing with max A in order to decrease level of assist with self care.  OT Short Term Goal 4 (Week 1): Pt will perform UB dressing with min A in order to increase level of assist with self care.   Skilled Therapeutic Interventions/Progress Updates:    Pt seated in w/c upon arrival. Pt declined baitng and dressing tasks this morning stating she already had on her clothing and did not want to remove them. Pt required max verbal cues and visual aids to orient to time of day.  Pt very fatigues having just returned from PT session.  Skilled OT focus on sit<>stand, standing balance, activity tolerance, orientation, and safety awareness.  Pt required max verbal cues to initiate all tasks and fatigues quickly requiring extended rest breaks.  Pt perseverated on reason for hospitalization.  Pt's daughter arrived and stated that patient was much more alert and following commands consistently when she was in ICU.  Daughter also stated that the MD was going to make some medication adjustments.  Therapy Documentation Precautions:  Precautions Precautions: Fall Precaution Comments: Seizure precautions Restrictions Weight Bearing Restrictions: No   Pain: Pain Assessment Pain Assessment: No/denies pain  See FIM for current functional status  Therapy/Group: Individual Therapy  Leroy Libman 04/02/2015, 11:02 AM

## 2015-04-03 ENCOUNTER — Inpatient Hospital Stay (HOSPITAL_COMMUNITY): Payer: Medicare Other | Admitting: Physical Therapy

## 2015-04-03 ENCOUNTER — Inpatient Hospital Stay (HOSPITAL_COMMUNITY): Payer: Medicare Other | Admitting: Speech Pathology

## 2015-04-03 ENCOUNTER — Inpatient Hospital Stay (HOSPITAL_COMMUNITY): Payer: Medicare Other

## 2015-04-03 DIAGNOSIS — R5383 Other fatigue: Secondary | ICD-10-CM

## 2015-04-03 NOTE — Progress Notes (Signed)
Physical Therapy Session Note  Patient Details  Name: [name] MRN: [id number] Date of Birth: May 19, 1918  Today's Date: 04/03/2015 PT Individual Time: 1300-1400 PT Individual Time Calculation (min): 60 min   Short Term Goals: Week 1:  PT Short Term Goal 1 (Week 1): Pt will perform bed mobility with Mod A PT Short Term Goal 2 (Week 1): Pt will perform transfers with Mod A PT Short Term Goal 3 (Week 1): Pt will ambulate 2' with LRAD and Max A PT Short Term Goal 4 (Week 1): Pt will perform w/c propulsion 70' with Max A  Skilled Therapeutic Interventions/Progress Updates:    Gait Training: PT instructed pt in ambulation with RW x 50 feet with min A and verbal cues for step length and upright posture. Pt demo's short/shuffling steps and forward flexed posture, but able to correct with cues.   W/C: PT instructed patient in w/c propulsion using BUEs. Pt initially required hand over hand cues but able to progress to supervision to propel w/c x100' before reporting UE fatigue.   Therapeutic Activity: PT instructed patient in supine<>sit in bed which patient performs with supervision and verbal cues for use of hand rails and sequencing. Pt performs squat/pivot transfers to/from w/c with min A and verbal cues for hand placement. PT instructed patient in repeated sit<>stand from mat using RW initially with mod A, progressing to supervision/steady A with verbal cues for hand placement and sequencing. PT instructed patient in seated dynamic balance activity reaching and matching cards and static standing balance exercise standing with RUE support and steady A while matching cards at a high table.   Therapy Documentation Precautions:  Precautions Precautions: Fall Precaution Comments: Seizure precautions Restrictions Weight Bearing Restrictions: No  Pain: Pain Assessment Pain Assessment: No/denies pain  Locomotion : Ambulation Ambulation/Gait Assistance: 4: Min assist   See FIM for  current functional status  Therapy/Group: Individual Therapy  Earnest Conroy Penven-Crew 04/03/2015, 4:17 PM

## 2015-04-03 NOTE — Procedures (Signed)
EEG report.  Brief clinical history: 81 y/o status post recent large intraparenchymal hemorrhage of the right temporal lobe.   Technique: this is a 17 channel routine scalp EEG performed at the bedside with bipolar and monopolar montages arranged in accordance to the international 10/20 system of electrode placement. One channel was dedicated to EKG recording.  The study was performed during wakefulness, drowsiness, and stage 2 sleep. No activating procedures performed.  Description:In the wakeful state, the best background consisted of a medium amplitude, posterior dominant, well sustained, symmetric and reactive 8 Hz rhythm. Drowsiness demonstrated dropout of the alpha rhythm. Stage 2 sleep showed symmetric and synchronous sleep spindles without intermixed epileptiform discharges. No focal or generalized epileptiform discharges noted.  No pathologic areas of slowing seen.  EKG showed sinus rhythm.  Impression: this is a normal awake and asleep EEG. Please, be aware that a normal EEG does not exclude the possibility of epilepsy.  Clinical correlation is advised.   Dorian Pod, MD

## 2015-04-03 NOTE — Progress Notes (Signed)
Social Work Patient ID: [name], female   DOB: 04-Jun-1918, 81 y.o.   MRN: [id number]  Left message for daughter with information from team conference.  We had spoken earlier yesterday about what would likely be discussed in conference, so confirm that team did set a LOS of 3 weeks with a targeted d/c date of 8/27.  MD and team working to wean down/ off [place] and hopes that cognition and alertness with improved with this change.  Plan to follow up with pt and daughter upon my return to [facility] on Monday 8/15.  Elsi Stelzer, LCSW

## 2015-04-03 NOTE — Progress Notes (Signed)
Subjective/Complaints: "I feel a little better" Sleeps ok In bed Doesn't remember me from yesterday  ROS  No breathing issues, no pain , no HA Objective: Vital Signs: Blood pressure 109/62, pulse 78, temperature 98.1 F (36.7 C), temperature source Oral, resp. rate 17, height '5\' 3"'  (1.6 m), weight 71.668 kg (158 lb), SpO2 98 %. No results found. Results for orders placed or performed during the hospital encounter of 03/29/15 (from the past 72 hour(s))  Urinalysis, Routine w reflex microscopic (not at [facility])     Status: Abnormal   Collection Time: 03/31/15  2:28 PM  Result Value Ref Range   Color, Urine YELLOW YELLOW   APPearance CLEAR CLEAR   Specific Gravity, Urine 1.013 1.005 - 1.030   pH 6.5 5.0 - 8.0   Glucose, UA NEGATIVE NEGATIVE mg/dL   Hgb urine dipstick TRACE (A) NEGATIVE   Bilirubin Urine NEGATIVE NEGATIVE   Ketones, ur NEGATIVE NEGATIVE mg/dL   Protein, ur NEGATIVE NEGATIVE mg/dL   Urobilinogen, UA 0.2 0.0 - 1.0 mg/dL   Nitrite NEGATIVE NEGATIVE   Leukocytes, UA SMALL (A) NEGATIVE  Culture, Urine     Status: None   Collection Time: 03/31/15  2:28 PM  Result Value Ref Range   Specimen Description URINE, CATHETERIZED    Special Requests none    Culture NO GROWTH 1 DAY    Report Status 04/01/2015 FINAL   Urine microscopic-add on     Status: None   Collection Time: 03/31/15  2:28 PM  Result Value Ref Range   WBC, UA 3-6 <3 WBC/hpf   RBC / HPF 3-6 <3 RBC/hpf   Bacteria, UA RARE RARE   Urine-Other FEW YEAST   CBC WITH DIFFERENTIAL     Status: Abnormal   Collection Time: 04/01/15  6:12 AM  Result Value Ref Range   WBC 10.3 4.0 - 10.5 K/uL   RBC 3.53 (L) 3.87 - 5.11 MIL/uL   Hemoglobin 10.6 (L) 12.0 - 15.0 g/dL   HCT 31.9 (L) 36.0 - 46.0 %   MCV 90.4 78.0 - 100.0 fL   MCH 30.0 26.0 - 34.0 pg   MCHC 33.2 30.0 - 36.0 g/dL   RDW 13.1 11.5 - 15.5 %   Platelets 313 150 - 400 K/uL   Neutrophils Relative % 77 43 - 77 %   Neutro Abs 7.9 (H) 1.7 - 7.7 K/uL    Lymphocytes Relative 13 12 - 46 %   Lymphs Abs 1.4 0.7 - 4.0 K/uL   Monocytes Relative 9 3 - 12 %   Monocytes Absolute 0.9 0.1 - 1.0 K/uL   Eosinophils Relative 1 0 - 5 %   Eosinophils Absolute 0.1 0.0 - 0.7 K/uL   Basophils Relative 0 0 - 1 %   Basophils Absolute 0.0 0.0 - 0.1 K/uL  Comprehensive metabolic panel     Status: Abnormal   Collection Time: 04/01/15  6:12 AM  Result Value Ref Range   Sodium 138 135 - 145 mmol/L   Potassium 4.0 3.5 - 5.1 mmol/L   Chloride 103 101 - 111 mmol/L   CO2 26 22 - 32 mmol/L   Glucose, Bld 125 (H) 65 - 99 mg/dL   BUN 19 6 - 20 mg/dL   Creatinine, Ser 0.84 0.44 - 1.00 mg/dL   Calcium 8.5 (L) 8.9 - 10.3 mg/dL   Total Protein 5.8 (L) 6.5 - 8.1 g/dL   Albumin 2.3 (L) 3.5 - 5.0 g/dL   AST 58 (H) 15 - 41 U/L  ALT 144 (H) 14 - 54 U/L   Alkaline Phosphatase 87 38 - 126 U/L   Total Bilirubin 0.4 0.3 - 1.2 mg/dL   GFR calc non Af Amer 57 (L) >60 mL/min   GFR calc Af Amer >60 >60 mL/min    Comment: (NOTE) The eGFR has been calculated using the CKD EPI equation. This calculation has not been validated in all clinical situations. eGFR's persistently <60 mL/min signify possible Chronic Kidney Disease.    Anion gap 9 5 - 15     HEENT: normal Cardio: RRR and no murmur Resp: CTA B/L and unlabored GI: BS positive and NT, ND Extremity:  No Edema Skin:   Intact Neuro: Flat, Abnormal Sensory parathesia to LT in RLE and Normal Motor Musc/Skel:  Other Bilateral Hallux valgus, severe Gen NAD   Assessment/Plan: 1. Functional deficits secondary to  Left Temporal ICH due to hypertensive crisis which require 3+ hours per day of interdisciplinary therapy in a comprehensive inpatient rehab setting. Physiatrist is providing close team supervision and 24 hour management of active medical problems listed below. Physiatrist and rehab team continue to assess barriers to discharge/monitor patient progress toward functional and medical goals.  FIM: FIM -  Bathing Bathing Steps Patient Completed: Chest, Right Arm, Left Arm, Abdomen Bathing: 2: Max-Patient completes 3-4 61f10 parts or 25-49%  FIM - Upper Body Dressing/Undressing Upper body dressing/undressing steps patient completed: Thread/unthread right bra strap, Thread/unthread right sleeve of pullover shirt/dresss, Thread/unthread left sleeve of pullover shirt/dress, Put head through opening of pull over shirt/dress Upper body dressing/undressing: 3: Mod-Patient completed 50-74% of tasks FIM - Lower Body Dressing/Undressing Lower body dressing/undressing steps patient completed: Don/Doff right shoe (Shoes only) Lower body dressing/undressing: 2: Max-Patient completed 25-49% of tasks  FIM - Toileting Toileting steps completed by patient: Performs perineal hygiene Toileting: 2: Max-Patient completed 1 of 3 steps  FIM - TRadio producerDevices: Grab bars Toilet Transfers: 2-From toilet/BSC: Max A (lift and lower assist), 2-To toilet/BSC: Max A (lift and lower assist)  FIM - BEngineer, siteAssistive Devices: Arm rests Bed/Chair Transfer: 4: Supine > Sit: Min A (steadying Pt. > 75%/lift 1 leg), 4: Sit > Supine: Min A (steadying pt. > 75%/lift 1 leg)  FIM - Locomotion: Wheelchair Distance: 150 Locomotion: Wheelchair: 4: Travels 150 ft or more: maneuvers on rugs and over door sillls with minimal assistance (Pt.>75%) FIM - Locomotion: Ambulation Locomotion: Ambulation Assistive Devices: WAdministratorAmbulation/Gait Assistance: 4: Min assist Locomotion: Ambulation: 1: Travels less than 50 ft with minimal assistance (Pt.>75%)  Comprehension Comprehension Mode: Auditory Comprehension: 3-Understands basic 50 - 74% of the time/requires cueing 25 - 50%  of the time  Expression Expression Mode: Verbal Expression: 3-Expresses basic 50 - 74% of the time/requires cueing 25 - 50% of the time. Needs to repeat parts of sentences.  Social  Interaction Social Interaction: 3-Interacts appropriately 50 - 74% of the time - May be physically or verbally inappropriate.  Problem Solving Problem Solving: 2-Solves basic 25 - 49% of the time - needs direction more than half the time to initiate, plan or complete simple activities  Memory Memory: 2-Recognizes or recalls 25 - 49% of the time/requires cueing 51 - 75% of the time  Medical Problem List and Plan: 1. Functional deficits secondary to right temporal intraparenchymal hemorrhage secondary to hypertensive crisis- encephalopathy related to [place] lethargy likely multifactorial including CVA effect , Keppra reduced but may take a couple days to take effect, consider methylphenidate, no  new neuro deficits 2. DVT Prophylaxis/Anticoagulation: Subcutaneous heparin initiated 03/25/2015. Monitor for a bleeding episodes 3. Pain Management: Voltaren gel 4 times a day to affected areas for OA. 4. Seizure prophylaxis. Keppra 500 mg twice a day. 2nd EEG negative for seizure, given potential SEreduced to 256m BID,discussed with Neuro, will  recheck EEG and if no focal epileptiform activity we can D/C 5. Neuropsych: This patient is capable of making decisions on her own behalf.s 6. Skin/Wound Care: Routine skin checks 7. Fluids/Electrolytes/Nutrition: Routine I&O, protein malnutrition, supplement 8. Hypertension. Cozaar 100 mg daily. Monitor with increased mobility, currently controlled but on low side 9. Hypokalemia. resolved 10. New bradycardia/ afib: HR at target particularly with increased exertion in therapy. No anticoag (except Subq hep for DVT prophyl) due to hemmorhagic CVA LOS (Days) 5 A FACE TO FACE EVALUATION WAS PERFORMED  KIRSTEINS,ANDREW E 04/03/2015, 9:51 AM

## 2015-04-03 NOTE — Progress Notes (Signed)
Social Work  Social Work Assessment and Plan  Patient Details  Name: [name] MRN: [id number] Date of Birth: 07-29-1918  Today's Date: 04/02/2015  Problem List:  Patient Active Problem List   Diagnosis Date Noted  . Atrial fibrillation 03/29/2015  . UTI (urinary tract infection) 03/28/2015  . Altered mental status   . Lethargy   . Leukocytosis   . ICH (intracerebral hemorrhage)   . Seizures   . Brain bleed   . Cerebral hemorrhage   . Cytotoxic brain edema 03/22/2015  . Brain herniation 03/22/2015  . Cerebral parenchymal hemorrhage 03/19/2015  . Dizzy spells 11/10/2014  . SPINAL STENOSIS, CERVICAL 06/11/2010  . Essential hypertension 06/22/2006  . Arthritis of right knee 06/22/2006  . Osteopenia 06/22/2006   Past Medical History:  Past Medical History  Diagnosis Date  . Hypertension   . Osteopenia    Past Surgical History:  Past Surgical History  Procedure Laterality Date  . Cataract extraction     Social History:  reports that she has never smoked. She has never used smokeless tobacco. She reports that she drinks about 0.6 oz of alcohol per week. She reports that she does not use illicit drugs.  Family / Support Systems Marital Status: Married Patient Roles: Spouse, Parent Spouse/Significant Other: husband, Trystin Hargrove @ (H) [phone] or ([phone] - recently home from SNF Children: daughter, Fredrich Birks @ [phone] or ([phone] Anticipated Caregiver: daughter and hired help Ability/Limitations of Caregiver: Husband elderly and cannot provide physical assistance.  Daughter does work two days/ week but could hire assist if needed Caregiver Availability: 24/7 Family Dynamics: Daughter very involved and supportive as well as her husband and daughter.  They moved pt and pt's husband into their home ~1 yr ago after they made all modifications for "in-law" area downstairs.  Social History Preferred language: English Religion: Catholic Cultural  Background: NA Read: Yes Write: Yes Employment Status: Retired Freight forwarder Issues: None Guardian/Conservator: per MD, pt not yet capable of making decisions on her own behalf.  Defer to husband/ daughter   Abuse/Neglect Physical Abuse: Denies Verbal Abuse: Denies Sexual Abuse: Denies Exploitation of patient/patient's resources: Denies Self-Neglect: Denies  Emotional Status Pt's affect, behavior adn adjustment status: Pt significantly impaired in alertness and when she does arouse to attempt answers, she provide incorrect answers (corrected by daughter).  She does not appear in any emotional distress.  She simply reports, "I just can't do anything.  I 'm so tired." Recent Psychosocial Issues: None Pyschiatric History: None Substance Abuse History: None  Patient / Family Perceptions, Expectations & Goals Pt/Family understanding of illness & functional limitations: Cannot assess pt's understanding due to decreased alertness and some confusion.  Daughter with good understanding of pt's stroke.  Daughter also very concerned with pt's decrease in arousal since the weekend which daughter feels is related to the start of Keppra.  She has been able to discuss this with MD> Premorbid pt/family roles/activities: Per daughter, pt was "100%" PTA.  Reports no physical or cognitive deficits.  Pt managed all of her own finances and independent in all home management.  Was also still driving for herself and husband. Anticipated changes in roles/activities/participation: Dependent on weaning of Keppra and overall improvements made with cognition.  Pt may require 24/7 if little improvement.  Daughter prepared to provide whatever level of care is needed. Pt/family expectations/goals: Daughter's goal is to have MD stop the Keppra and feels certain that her mother "will come back"  [employer]  Community Agencies: None Premorbid Home Care/DME Agencies: None Transportation available at  discharge: yes  Discharge Planning Living Arrangements: Spouse/significant other, Children Support Systems: Spouse/significant other, Children Type of Residence: Private residence Insurance Resources: Chartered certified accountant Resources: [place] Referred: No Living Expenses: Own Money Management: Patient Does the patient have any problems obtaining your medications?: No Home Management: pt Patient/Family Preliminary Plans: Daughter reports plan is for pt to d/c home and family/ private duty will cover whatever about of assistance is needed. Social Work Anticipated Follow Up Needs: HH/OP Expected length of stay: 3 weeks  Clinical Impression Elderly woman here following a stroke.  Daughter at bedside as pt makes a couple of attempts to answer questions, however, is unable to do so clearly or accurately.  Daughter quickly states, "This is not my mother" and reports she felt pt's cognitive and arousal decline began with addition of Keppra.  She has been able to discuss with MD.  Daughter reports that pt was completely independent PTA and was "back to normal" a couple days after her stroke.  Family able to provide 24/7 care at home.  Will follow for support and d/c planning needs.  German Manke 04/02/2015, 12:00 PM

## 2015-04-03 NOTE — Progress Notes (Signed)
EEG Completed; Results Pending  

## 2015-04-03 NOTE — Progress Notes (Signed)
Speech Language Pathology Daily Session Note  Patient Details  Name: [name] MRN: [id number] Date of Birth: 11/03/1917  Today's Date: 04/03/2015 SLP Individual Time: 0800-0900 SLP Individual Time Calculation (min): 60 min  Short Term Goals: Week 1: SLP Short Term Goal 1 (Week 1): Patient will utilize external memory aids to increase orientation with Min A verbal and question cues.  SLP Short Term Goal 2 (Week 1): Patient will sustain attention to a functional task for 5 minutes with Min A verbal cues.  SLP Short Term Goal 3 (Week 1): Patient will utilize call bell to request assistance in 50% of observable opportunities with Mod A question cues.  SLP Short Term Goal 4 (Week 1): Patient will demonstrate functional problem solving for basic and familiar tasks with Mod A verbal cues.  SLP Short Term Goal 5 (Week 1): Patient will consume current diet with minimal overt s/s of aspiration with Min A verbal cues for use of swallowing compensatory strategies.  SLP Short Term Goal 6 (Week 1): Patient will demonstrate efficient mastication of Dys. 3 textures without overt s/s of aspiration over 2 consecutive sessions with Min A verbal cues.   Skilled Therapeutic Interventions: Skilled treatment session focused on dysphagia and cognitive goals. SLP facilitated session by providing skilled observation with breakfast meal of Dys. 2 textures with thin liquids. Patient demonstrated efficient mastication without overt s/s of aspiration but required Min A verbal cues for use of small bites.  Recommend patient continue current diet of Dys. 2 textures due to continued fatigue. Patient consumed ~50% of her meal and required Min A verbal cues for sustained attention to self-feeding task for 30 minutes. Patient was independently oriented to place and situation but required Max A verbal and question cues for utilization of external memory aids for orientation to time. Patient also demonstrated increased  intellectual awareness of deficits in regards to her "mental state," "difficulty walking" and "being so tired." Patient left supine in bed with alarm on and all needs within reach. Continue with current plan of care.    FIM:  Comprehension Comprehension: 4-Understands basic 75 - 89% of the time/requires cueing 10 - 24% of the time Expression Expression: 4-Expresses basic 75 - 89% of the time/requires cueing 10 - 24% of the time. Needs helper to occlude trach/needs to repeat words. Social Interaction Social Interaction: 3-Interacts appropriately 50 - 74% of the time - May be physically or verbally inappropriate. Problem Solving Problem Solving: 2-Solves basic 25 - 49% of the time - needs direction more than half the time to initiate, plan or complete simple activities Memory Memory: 2-Recognizes or recalls 25 - 49% of the time/requires cueing 51 - 75% of the time FIM - Eating Eating Activity: 5: Supervision/cues;5: Set-up assist for open containers  Pain Pain Assessment Pain Assessment: No/denies pain   Therapy/Group: Individual Therapy  Reneta Niehaus 04/03/2015, 12:37 PM

## 2015-04-03 NOTE — Progress Notes (Signed)
Occupational Therapy Session Note  Patient Details  Name: [name] MRN: [id number] Date of Birth: [birth year]  Today's Date: 04/03/2015 OT Individual Time: 1100-1200 OT Individual Time Calculation (min): 60 min    Short Term Goals: Week 1:  OT Short Term Goal 1 (Week 1): Pt will perform sit <>stand with mod A during self care. OT Short Term Goal 2 (Week 1): Pt will perform shower transfer with mod A in order to increase I in functional transfers. OT Short Term Goal 3 (Week 1): Pt will perform LB dressing with max A in order to decrease level of assist with self care.  OT Short Term Goal 4 (Week 1): Pt will perform UB dressing with min A in order to increase level of assist with self care.   Skilled Therapeutic Interventions/Progress Updates:    Pt asleep in bed upon arrival but easily aroused.  Pt required mod verbal cues for orientation and encouragement to engage in therapy.  Pt consistently stated throughout session that she was "so tired." Pt declined bathing at sink but agreed to donning clothing.  Pt perseverated on her orange shorts, which were not in her room.  Pt required more than a reasonable amount of time to initiate and complete tasks.  Pt required min verbal cues to initiate dressing tasks. Pt became despondent when talking about her deceased daughter and brother. Emotional support provided. Pt required mod A for sit<>stand and max A for SPT bed->w/c.  Pt stated she was nervous about standing because she didn't want to fall.  Pt remained in w/c with QRB in place and all needs within reach. Focus on activity tolerance, sit<>stand, functional transfers, standing balance, orientation, task initiation, sequencing, attention to task, and safety awareness.  Therapy Documentation Precautions:  Precautions Precautions: Fall Precaution Comments: Seizure precautions Restrictions Weight Bearing Restrictions: No   Pain: Pain Assessment Pain Assessment: Faces Faces Pain Scale:  Hurts little more Pain Type: Acute pain Pain Location: Calf Pain Orientation: Right;Left Pain Descriptors / Indicators: Aching Pain Onset: Other (Comment) (when donning TED hose) Pain Intervention(s): RN made aware  See FIM for current functional status  Therapy/Group: Individual Therapy  Leroy Libman 04/03/2015, 12:22 PM

## 2015-04-03 NOTE — Care Management Note (Signed)
Inpatient Rehabilitation Center Individual Statement of Services  Patient Name:  [name]  Date:  04/02/2015  Welcome to the [facility].  Our goal is to provide you with an individualized program based on your diagnosis and situation, designed to meet your specific needs.  With this comprehensive rehabilitation program, you will be expected to participate in at least 3 hours of rehabilitation therapies Monday-Friday, with modified therapy programming on the weekends.  Your rehabilitation program will include the following services:  Physical Therapy (PT), Occupational Therapy (OT), Speech Therapy (ST), 24 hour per day rehabilitation nursing, Therapeutic Recreaction (TR), Neuropsychology, Case Management (Social Worker), Rehabilitation Medicine, Nutrition Services and Pharmacy Services  Weekly team conferences will be held on Tuesdays to discuss your progress.  Your Social Worker will talk with you frequently to get your input and to update you on team discussions.  Team conferences with you and your family in attendance may also be held.  Expected length of stay: 3 weeks  Overall anticipated outcome: minimal assistance  Depending on your progress and recovery, your program may change. Your Social Worker will coordinate services and will keep you informed of any changes. Your Social Worker's name and contact numbers are listed  below.  The following services may also be recommended but are not provided by the [facility] will be made to provide these services after discharge if needed.  Arrangements include referral to agencies that provide these services.  Your insurance has been verified to be:  [facility] Medicare Your primary doctor is:  Dr. Yong Channel  Pertinent information will be shared with your doctor and your insurance  company.  Social Worker:  [place], [place] or ([phone]   Information discussed with and copy given to patient by: Lennart Pall, 04/02/2015, 12:00 PM

## 2015-04-04 ENCOUNTER — Inpatient Hospital Stay (HOSPITAL_COMMUNITY): Payer: Self-pay | Admitting: Physical Therapy

## 2015-04-04 ENCOUNTER — Inpatient Hospital Stay (HOSPITAL_COMMUNITY): Payer: Medicare Other

## 2015-04-04 ENCOUNTER — Inpatient Hospital Stay (HOSPITAL_COMMUNITY): Payer: Medicare Other | Admitting: Speech Pathology

## 2015-04-04 NOTE — Progress Notes (Signed)
Occupational Therapy Session Note  Patient Details  Name: [name] MRN: [id number] Date of Birth: 1918-04-28  Today's Date: 04/04/2015 OT Individual Time: 7017-7939 OT Individual Time Calculation (min): 60 min   Short Term Goals: Week 1:  OT Short Term Goal 1 (Week 1): Pt will perform sit <>stand with mod A during self care. OT Short Term Goal 2 (Week 1): Pt will perform shower transfer with mod A in order to increase I in functional transfers. OT Short Term Goal 3 (Week 1): Pt will perform LB dressing with max A in order to decrease level of assist with self care.  OT Short Term Goal 4 (Week 1): Pt will perform UB dressing with min A in order to increase level of assist with self care.   Skilled Therapeutic Interventions/Progress Updates: ADL-retraining with focus on improved dynamic standing, toileting,sit<>stand, and seated bathing/dressing skills, seated grooming.   Pt received supine in bed, alert and receptive for planned session to perform shower level ADL.   Pt rose to edge of bed with just overall mod assist but then reported need to pass BM.   After setup to place [facility] at bedside, pt completed SPT to [facility] and voided medium BM given extra time to eliminated.  Pt stood to perform toilet hygiene and maintained her balance for 4 minutes while washing peri-area and buttocks with 70% thoroughness.   Pt complained of back pain and recovered to w/c placed behind her after therapist removed BSC.  Pt was placed at sink in w/c and continued self-care with overall min-mod assist continuous assist to progress.   Pt noted with improved alertness throughout session although requiring mod vc to sequence d/t perseveration with retelling her personal history.   Pt remained in w/c at end of session with all needs within reach.    Therapy Documentation Precautions:  Precautions Precautions: Fall Precaution Comments: Seizure precautions Restrictions Weight Bearing Restrictions: No   Pain: Pain  Assessment Pain Assessment: No/denies pain   See FIM for current functional status  Therapy/Group: Individual Therapy  [place] 04/04/2015, 12:22 PM

## 2015-04-04 NOTE — Progress Notes (Signed)
Speech Language Pathology Daily Session Note  Patient Details  Name: [name] MRN: [id number] Date of Birth: September 27, 1917  Today's Date: 04/04/2015 SLP Individual Time: 1430-1515 SLP Individual Time Calculation (min): 45 min  Short Term Goals: Week 1: SLP Short Term Goal 1 (Week 1): Patient will utilize external memory aids to increase orientation with Min A verbal and question cues.  SLP Short Term Goal 2 (Week 1): Patient will sustain attention to a functional task for 5 minutes with Min A verbal cues.  SLP Short Term Goal 3 (Week 1): Patient will utilize call bell to request assistance in 50% of observable opportunities with Mod A question cues.  SLP Short Term Goal 4 (Week 1): Patient will demonstrate functional problem solving for basic and familiar tasks with Mod A verbal cues.  SLP Short Term Goal 5 (Week 1): Patient will consume current diet with minimal overt s/s of aspiration with Min A verbal cues for use of swallowing compensatory strategies.  SLP Short Term Goal 6 (Week 1): Patient will demonstrate efficient mastication of Dys. 3 textures without overt s/s of aspiration over 2 consecutive sessions with Min A verbal cues.   Skilled Therapeutic Interventions: Skilled treatment session focused on cognitive goals. Upon arrival, patient was asleep in bed but was easily aroused to touch. SLP facilitated session by transferring patient to the wheelchair to maximize arousal and alertness.  Patient required Max verbal cues throughout the session to attend to task and keep her eyes open. Patient also required Max A question cues for recall of events from previous therapy sessions and was perseverative on "being so tired" and wanting to get back into bed. Patient participated in a basic sorting task with Mod A verbal cues to self-monitor and correct errors. Patient transferred back to bed at end of session.  Patient left supine in bed with all needs within reach. Continue with current plan of  care.    FIM:  Comprehension Comprehension Mode: Auditory Comprehension: 4-Understands basic 75 - 89% of the time/requires cueing 10 - 24% of the time Expression Expression Mode: Verbal Expression: 4-Expresses basic 75 - 89% of the time/requires cueing 10 - 24% of the time. Needs helper to occlude trach/needs to repeat words. (Simultaneous filing. User may not have seen previous data.) Social Interaction Social Interaction: 4-Interacts appropriately 75 - 89% of the time - Needs redirection for appropriate language or to initiate interaction. (Simultaneous filing. User may not have seen previous data.) Problem Solving Problem Solving: 3-Solves basic 50 - 74% of the time/requires cueing 25 - 49% of the time (Simultaneous filing. User may not have seen previous data.) Memory Memory: 4-Recognizes or recalls 75 - 89% of the time/requires cueing 10 - 24% of the time (Simultaneous filing. User may not have seen previous data.) FIM - Eating Eating Activity: 5: Supervision/cues;5: Set-up assist for open containers  Pain Pain Assessment Pain Assessment: No/denies pain  Therapy/Group: Individual Therapy  Thanh Mottern 04/04/2015, 4:35 PM

## 2015-04-04 NOTE — Progress Notes (Signed)
Subjective/Complaints: "I feel tired" Do I need to sleep with TEDs?  ROS  No breathing issues, no pain , no HA Objective: Vital Signs: Blood pressure 140/68, pulse 84, temperature 97.7 F (36.5 C), temperature source Oral, resp. rate 17, height 5\' 3"  (1.6 m), weight 71.668 kg (158 lb), SpO2 94 %. No results found. No results found for this or any previous visit (from the past 72 hour(s)).   HEENT: normal Cardio: RRR and no murmur Resp: CTA B/L and unlabored GI: BS positive and NT, ND Extremity:  No Edema Skin:   Intact Neuro: Flat, Abnormal Sensory parathesia to LT in RLE and Normal Motor Musc/Skel:  Other Bilateral Hallux valgus, severe Gen NAD   Assessment/Plan: 1. Functional deficits secondary to  Left Temporal ICH due to hypertensive crisis which require 3+ hours per day of interdisciplinary therapy in a comprehensive inpatient rehab setting. Physiatrist is providing close team supervision and 24 hour management of active medical problems listed below. Physiatrist and rehab team continue to assess barriers to discharge/monitor patient progress toward functional and medical goals.  FIM: FIM - Bathing Bathing Steps Patient Completed: Chest, Right Arm, Left Arm, Abdomen Bathing: 0: Activity did not occur  FIM - Upper Body Dressing/Undressing Upper body dressing/undressing steps patient completed: Thread/unthread right bra strap, Thread/unthread right sleeve of pullover shirt/dresss, Thread/unthread left sleeve of pullover shirt/dress, Thread/unthread left bra strap Upper body dressing/undressing: 3: Mod-Patient completed 50-74% of tasks FIM - Lower Body Dressing/Undressing Lower body dressing/undressing steps patient completed: Thread/unthread right pants leg, Thread/unthread left pants leg Lower body dressing/undressing: 2: Max-Patient completed 25-49% of tasks  FIM - Toileting Toileting steps completed by patient: Performs perineal hygiene Toileting: 2: Max-Patient  completed 1 of 3 steps  FIM - Radio producer Devices: Grab bars Toilet Transfers: 2-From toilet/BSC: Max A (lift and lower assist), 2-To toilet/BSC: Max A (lift and lower assist)  FIM - Engineer, site Assistive Devices: Arm rests Bed/Chair Transfer: 5: Supine > Sit: Supervision (verbal cues/safety issues), 5: Sit > Supine: Supervision (verbal cues/safety issues), 4: Bed > Chair or W/C: Min A (steadying Pt. > 75%), 4: Chair or W/C > Bed: Min A (steadying Pt. > 75%)  FIM - Locomotion: Wheelchair Distance: 150 Locomotion: Wheelchair: 4: Travels 150 ft or more: maneuvers on rugs and over door sillls with minimal assistance (Pt.>75%) FIM - Locomotion: Ambulation Locomotion: Ambulation Assistive Devices: Administrator Ambulation/Gait Assistance: 4: Min assist Locomotion: Ambulation: 2: Travels 50 - 149 ft with minimal assistance (Pt.>75%)  Comprehension Comprehension Mode: Auditory Comprehension: 4-Understands basic 75 - 89% of the time/requires cueing 10 - 24% of the time  Expression Expression Mode: Verbal Expression: 4-Expresses basic 75 - 89% of the time/requires cueing 10 - 24% of the time. Needs helper to occlude trach/needs to repeat words.  Social Interaction Social Interaction: 3-Interacts appropriately 50 - 74% of the time - May be physically or verbally inappropriate.  Problem Solving Problem Solving: 2-Solves basic 25 - 49% of the time - needs direction more than half the time to initiate, plan or complete simple activities  Memory Memory: 2-Recognizes or recalls 25 - 49% of the time/requires cueing 51 - 75% of the time  Medical Problem List and Plan: 1. Functional deficits secondary to right temporal intraparenchymal hemorrhage secondary to hypertensive crisis- encephalopathy related to [place], lethargy likely multifactorial including CVA effect , Keppra D/Ced today but may take a couple days to take effect, consider  methylphenidate, no new neuro deficits 2. DVT  Prophylaxis/Anticoagulation: Subcutaneous heparin initiated 03/25/2015. Monitor for a bleeding episodes 3. Pain Management: Voltaren gel 4 times a day to affected areas for OA. 4. Seizure prophylaxis. Keppra 500 mg twice a day. 2nd EEG negative for seizure,discussed with Neuro,   recheck EEG  no focal epileptiform activity we can D/C Keppra 5. Neuropsych: This patient is capable of making decisions on her own behalf.s 6. Skin/Wound Care: Routine skin checks 7. Fluids/Electrolytes/Nutrition: Routine I&O, protein malnutrition, supplement 8. Hypertension. Cozaar 100 mg daily. Monitor with increased mobility, currently controlled 140/80 9. Hypokalemia. resolved 10. New bradycardia/ afib: HR at target particularly with increased exertion in therapy. No anticoag (except Subq hep for DVT prophyl) due to hemmorhagic CVA LOS (Days) 6 A FACE TO FACE EVALUATION WAS PERFORMED  [name] 04/04/2015, 9:54 AM

## 2015-04-04 NOTE — Progress Notes (Signed)
Physical Therapy Session Note  Patient Details  Name: [name] MRN: [id number] Date of Birth: February 05, 1918  Today's Date: 04/04/2015 PT Individual Time: 1105-1205 PT Individual Time Calculation (min): 60 min   Short Term Goals: Week 1:  PT Short Term Goal 1 (Week 1): Pt will perform bed mobility with Mod A PT Short Term Goal 2 (Week 1): Pt will perform transfers with Mod A PT Short Term Goal 3 (Week 1): Pt will ambulate 3' with LRAD and Max A PT Short Term Goal 4 (Week 1): Pt will perform w/c propulsion 37' with Max A  Skilled Therapeutic Interventions/Progress Updates:    Pt continues to complain of being tired and frequently asks to return to the room to lie down and rest.   Gait Training: Pt ambulates 85' with RW and min A. Pt requires max verbal cues for increased step length, upright posture, and to stay attended to task. Pt demo's short/shuffling steps with decreased foot clearance, forward flexed posture, and slow gait speed.  Therapeutic Activity: PT instructed patient in sit<>stand from mat with supervision and squat/pivot transfers from bed/mat <>w/c with min A and verbal cues for hand placement. Pt continues to require verbal cues to push up from surface rather than pull on the walker to stand.  PT instructed patient in dynamic standing balance activity, reaching down to the L with the R hand to retrieve colored disks and place them in the matching pile on table to the right. Pt required min A to maintain balance with weight shift, progressing to steady A. When patient fatigued, continued activity to challenge dynamic sitting balance, having patient reach across midline and shift weight onto L forearm to retrieve disk and place on table.  Pt returned to room at end of session, requesting to go to bed. Sit>supine with supervision and verbal cues for scooting to straighten up in the bed.    W/C: PT instructed patient in w/c propulsion using UEs. Pt propelled w/c x50' with min A to  negotiate turns and max verbal cues to push equally with R and L hands and for steering. Pt performance improved with PT providing visual and verbal cues from the front/facing patient.  PT instructed patient in use of brakes and initiated education on swing away leg rests. Pt continues to struggle with brake management 2/2 UE weakness. Able to swing away L leg rest with verbal cues.   Therapy Documentation Precautions:  Precautions Precautions: Fall Precaution Comments: Seizure precautions Restrictions Weight Bearing Restrictions: No  Pain: Pain Assessment Pain Assessment: No/denies pain    See FIM for current functional status  Therapy/Group: Individual Therapy  Earnest Conroy Penven-Crew 04/04/2015, 3:41 PM

## 2015-04-05 ENCOUNTER — Inpatient Hospital Stay (HOSPITAL_COMMUNITY): Payer: Medicare Other

## 2015-04-05 ENCOUNTER — Inpatient Hospital Stay (HOSPITAL_COMMUNITY): Payer: Self-pay | Admitting: Physical Therapy

## 2015-04-05 ENCOUNTER — Inpatient Hospital Stay (HOSPITAL_COMMUNITY): Payer: Medicare Other | Admitting: Speech Pathology

## 2015-04-05 NOTE — Progress Notes (Signed)
Speech Language Pathology Weekly Progress and Session Note  Patient Details  Name: [name] MRN: [id number] Date of Birth: 06/06/1918  Beginning of progress report period: March 29, 2015 End of progress report period: April 05, 2015  Today's Date: 04/05/2015 SLP Individual Time: 0900-1000 SLP Individual Time Calculation (min): 60 min  Short Term Goals: Week 1: SLP Short Term Goal 1 (Week 1): Patient will utilize external memory aids to increase orientation with Min A verbal and question cues.  SLP Short Term Goal 1 - Progress (Week 1): Met SLP Short Term Goal 2 (Week 1): Patient will sustain attention to a functional task for 5 minutes with Min A verbal cues.  SLP Short Term Goal 2 - Progress (Week 1): Met SLP Short Term Goal 3 (Week 1): Patient will utilize call bell to request assistance in 50% of observable opportunities with Mod A question cues.  SLP Short Term Goal 3 - Progress (Week 1): Not met SLP Short Term Goal 4 (Week 1): Patient will demonstrate functional problem solving for basic and familiar tasks with Mod A verbal cues.  SLP Short Term Goal 4 - Progress (Week 1): Met SLP Short Term Goal 5 (Week 1): Patient will consume current diet with minimal overt s/s of aspiration with Min A verbal cues for use of swallowing compensatory strategies.  SLP Short Term Goal 5 - Progress (Week 1): Met SLP Short Term Goal 6 (Week 1): Patient will demonstrate efficient mastication of Dys. 3 textures without overt s/s of aspiration over 2 consecutive sessions with Min A verbal cues.  SLP Short Term Goal 6 - Progress (Week 1): Other (comment)    New Short Term Goals: Week 2: SLP Short Term Goal 1 (Week 2): Patient will demonstrate efficient mastication of Dys. 3 textures without overt s/s of aspiration over 2 consecutive sessions with Min A verbal cues.  SLP Short Term Goal 2 (Week 2): Patient will consume current diet with minimal overt s/s of aspiration with supervision verbal cues for  use of swallowing compensatory strategies.  SLP Short Term Goal 3 (Week 2): Patient will demonstrate functional problem solving for basic and familiar tasks with Min A verbal cues.  SLP Short Term Goal 4 (Week 2): Patient will utilize call bell to request assistance in 50% of observable opportunities with Mod A question cues.  SLP Short Term Goal 5 (Week 2): Patient will sustain attention to a functional task for 10 minutes with Min A verbal cues.  SLP Short Term Goal 6 (Week 2): Patient will utilize external memory aids to increase orientation with supervision verbal and question cues.   Weekly Progress Updates: Patient has made minimal but functional gains and has met 4 of 6 STG's this reporting period due to increased orientation, attention, problem solving and swallowing function. Currently, patient is consuming Dys. 2 textures with thin liquids with minimal overt s/s of aspiration and requires Min A verbal cues for use of swallowing compensatory strategies. Trials of Dys. 3 textures have not been attempted due to patient's constant fatigue and lethargy. Patient also requires overall Max A multimodal cues for recall of new and functional information and use of call bell to request assistance, Mod A for functional problem solving with basic and familiar tasks and Min A verbal cues for orientation and sustained attention for ~5 minutes.  Patient's overall function has been impacted by fatigue, however, physician has been making changes to the patient's medications and patient has demonstrated increased arousal and alertness for the past  2 days.  Patient education is ongoing and patient would benefit from continued skilled SLP intervention to maximize cognitive and swallowing function and overall functional independence prior to discharge.    Intensity: Minumum of 1-2 x/day, 30 to 90 minutes Frequency: 3 to 5 out of 7 days Duration/Length of Stay: 04/20/15 Treatment/Interventions: Cognitive  remediation/compensation;Cueing hierarchy;Environmental controls;Functional tasks;Internal/external aids;Patient/family education;Therapeutic Activities;Dysphagia/aspiration precaution training   Daily Session  Skilled Therapeutic Interventions: Skilled treatment session focused on cognitive goals. Upon arrival, patient was awake while supine in bed and reported she needed to use the bathroom. Patient transferred to the toilet with Mod A verbal cues for safety with task. Patient performed basic grooming tasks with extra time and Mod A verbal cues for functional problem solving with tasks. Patient was oriented to time, place and situation with Min A verbal cues and participated in a basic written expression and reading comprehension task with Mod I and extra time. Patient utilized her schedule to anticipate upcoming therapy sessions with Mod A verbal and visual cues. Overall, patient demonstrated increased alertness and arousal throughout the session.Patient handed off to OT.    FIM:  Comprehension Comprehension Mode: Auditory Comprehension: 4-Understands basic 75 - 89% of the time/requires cueing 10 - 24% of the time Expression Expression Mode: Verbal Expression: 4-Expresses basic 75 - 89% of the time/requires cueing 10 - 24% of the time. Needs helper to occlude trach/needs to repeat words. Social Interaction Social Interaction: 4-Interacts appropriately 75 - 89% of the time - Needs redirection for appropriate language or to initiate interaction. Problem Solving Problem Solving: 3-Solves basic 50 - 74% of the time/requires cueing 25 - 49% of the time Memory Memory: 2-Recognizes or recalls 25 - 49% of the time/requires cueing 51 - 75% of the time Pain No/Denies Pain   Therapy/Group: Individual Therapy  [name] 04/05/2015, 4:13 PM

## 2015-04-05 NOTE — Progress Notes (Signed)
Occupational Therapy Session Note  Patient Details  Name: [name] MRN: [id number] Date of Birth: [birth year]  Today's Date: 04/05/2015 OT Individual Time: 1000-1100 OT Individual Time Calculation (min): 60 min    Short Term Goals: Week 1:  OT Short Term Goal 1 (Week 1): Pt will perform sit <>stand with mod A during self care. OT Short Term Goal 2 (Week 1): Pt will perform shower transfer with mod A in order to increase I in functional transfers. OT Short Term Goal 3 (Week 1): Pt will perform LB dressing with max A in order to decrease level of assist with self care.  OT Short Term Goal 4 (Week 1): Pt will perform UB dressing with min A in order to increase level of assist with self care.   Skilled Therapeutic Interventions/Progress Updates:     Pt resting in w/c upon arrival.  Pt declined changing clothing because she was warm and did not want to don any new clothing that is not as warm.  Pt engaged in dynamic sitting and standing tasks while completing BUE tasks.  Pt requires min A for standing balance, sit<>stand, and dynamic sitting balance tasks.  Pt requires more than a reasonable amount of time to complete tasks with multiple rest breaks.  Pt was able to maintain conversation on topic while performing tasks.  Pt returned to bed with all needs within reach.  Therapy Documentation Precautions:  Precautions Precautions: Fall Precaution Comments: Seizure precautions Restrictions Weight Bearing Restrictions: No General:   Vital Signs:  Pain:  Pt denied pain  See FIM for current functional status  Therapy/Group: Individual Therapy  Leroy Libman 04/05/2015, 12:20 PM

## 2015-04-05 NOTE — Progress Notes (Signed)
Physical Therapy Weekly Progress Note  Patient Details  Name: [name] MRN: [id number] Date of Birth: 1917/09/20  Beginning of progress report period: March 30, 2015 End of progress report period: April 05, 2015  Patient has met 4 of 4 short term goals.    Patient continues to demonstrate the following deficits: activity tolerance, endurance, strength, dynamic sitting/standing balance, and decreased safety awareness and therefore will continue to benefit from skilled PT intervention to enhance overall performance with activity tolerance, balance, postural control, attention, awareness and coordination.  Patient progressing toward long term goals..  Continue plan of care.  PT Short Term Goals Week 2:  PT Short Term Goal 1 (Week 2): Pt will demonstrate supine<>sit and scooting in bed with supervision.  PT Short Term Goal 2 (Week 2): Pt will demonstrate sit<>stand from a variety of surfaces with mod I PT Short Term Goal 3 (Week 2): Pt will ambulate 120 feet with RW and min A PT Short Term Goal 4 (Week 2): Pt will propel w/c 150' with supervision    Therapy Documentation Precautions:  Precautions Precautions: Fall Precaution Comments: Seizure precautions Restrictions Weight Bearing Restrictions: No  Locomotion : Ambulation Ambulation/Gait Assistance: 4: Min guard    Balance: Balance Balance Assessed: Yes Standardized Balance Assessment Standardized Balance Assessment: Timed Up and Go Test Timed Up and Go Test TUG: Normal TUG Normal TUG (seconds): 69.3  See FIM for current functional status   [name] 04/05/2015, 4:34 PM

## 2015-04-05 NOTE — Progress Notes (Addendum)
Physical Therapy Session Note  Patient Details  Name: [name] MRN: [id number] Date of Birth: September 27, 1917  Today's Date: 04/05/2015 PT Individual Time: 1306-1406 PT Individual Time Calculation (min): 60 min   Short Term Goals: Week 1:  PT Short Term Goal 1 (Week 1): Pt will perform bed mobility with Mod A PT Short Term Goal 2 (Week 1): Pt will perform transfers with Mod A PT Short Term Goal 3 (Week 1): Pt will ambulate 79' with LRAD and Max A PT Short Term Goal 4 (Week 1): Pt will perform w/c propulsion 75' with Max A  Skilled Therapeutic Interventions/Progress Updates:    Therapeutic Activity:  Pt received in w/c and therapist pushed pt to gym in w/c for efficiency. PT administered normal TUG, avg of 3 trials was 69.3 seconds.  Cues during TUG for increased step length and upright posture.  Pt participated in standing balance activity reaching for horse shoes on basketball goal. Pt used RW for steady A and demonstrates erect posture, decreased trunk rotation with reaching to R/L and decreased protective reactions. Pt performed sit<>stand from a variety of surfaces with supervision. Pt continues to require verbal cues for hand placement with sit<>stand transfers.    Pt asked if she would always feel so tired. PT explained that with brain injuries, fatigue was normal because the brain was working harder to concentrate on normal daily activities.  PT encouraged patient to ask for "brain breaks" during therapy sessions when she felt tired to maximize overall daily potential for focus and concentration.   Therapy Documentation Precautions:  Precautions Precautions: Fall Precaution Comments: Seizure precautions Restrictions Weight Bearing Restrictions: No   Balance: Balance Balance Assessed: Yes Standardized Balance Assessment Standardized Balance Assessment: Timed Up and Go Test Timed Up and Go Test TUG: Normal TUG Normal TUG (seconds): 69.3   See FIM for current functional  status  Therapy/Group: Individual Therapy  Earnest Conroy Penven-Crew 04/05/2015, 3:55 PM

## 2015-04-05 NOTE — Progress Notes (Signed)
Subjective/Complaints: "I feel less tired" Propels WC  ROS  No breathing issues, no pain , no HA Objective: Vital Signs: Blood pressure 141/67, pulse 87, temperature 97.8 F (36.6 C), temperature source Oral, resp. rate 17, height 5\' 3"  (1.6 m), weight 71.668 kg (158 lb), SpO2 99 %. No results found. No results found for this or any previous visit (from the past 72 hour(s)).   HEENT: normal Cardio: RRR and no murmur Resp: CTA B/L and unlabored GI: BS positive and NT, ND Extremity:  No Edema Skin:   Intact Neuro: Flat, Abnormal Sensory parathesia to LT in RLE and Normal Motor Musc/Skel:  Other Bilateral Hallux valgus, severe Gen NAD   Assessment/Plan: 1. Functional deficits secondary to  Left Temporal ICH due to hypertensive crisis which require 3+ hours per day of interdisciplinary therapy in a comprehensive inpatient rehab setting. Physiatrist is providing close team supervision and 24 hour management of active medical problems listed below. Physiatrist and rehab team continue to assess barriers to discharge/monitor patient progress toward functional and medical goals.  FIM: FIM - Bathing Bathing Steps Patient Completed: Chest, Right Arm, Left Arm, Abdomen, Front perineal area, Buttocks, Right upper leg, Left upper leg Bathing: 4: Min-Patient completes 8-9 37f 10 parts or 75+ percent  FIM - Upper Body Dressing/Undressing Upper body dressing/undressing steps patient completed: Thread/unthread right sleeve of pullover shirt/dresss, Thread/unthread left sleeve of pullover shirt/dress, Put head through opening of pull over shirt/dress, Pull shirt over trunk Upper body dressing/undressing: 4: Min-Patient completed 75 plus % of tasks FIM - Lower Body Dressing/Undressing Lower body dressing/undressing steps patient completed: Thread/unthread right pants leg, Thread/unthread left pants leg Lower body dressing/undressing: 2: Max-Patient completed 25-49% of tasks  FIM -  Toileting Toileting steps completed by patient: Adjust clothing prior to toileting, Performs perineal hygiene Toileting: 2: Max-Patient completed 1 of 3 steps  FIM - Radio producer Devices: Bedside commode Toilet Transfers: 3-To toilet/BSC: Mod A (lift or lower assist), 4-From toilet/BSC: Min A (steadying Pt. > 75%)  FIM - Bed/Chair Transfer Bed/Chair Transfer Assistive Devices: Bed rails, Arm rests Bed/Chair Transfer: 3: Supine > Sit: Mod A (lifting assist/Pt. 50-74%/lift 2 legs, 3: Bed > Chair or W/C: Mod A (lift or lower assist)  FIM - Locomotion: Wheelchair Distance: 150 Locomotion: Wheelchair: 4: Travels 150 ft or more: maneuvers on rugs and over door sillls with minimal assistance (Pt.>75%) FIM - Locomotion: Ambulation Locomotion: Ambulation Assistive Devices: Administrator Ambulation/Gait Assistance: 4: Min assist Locomotion: Ambulation: 2: Travels 50 - 149 ft with minimal assistance (Pt.>75%)  Comprehension Comprehension Mode: Auditory Comprehension: 4-Understands basic 75 - 89% of the time/requires cueing 10 - 24% of the time  Expression Expression Mode: Verbal Expression: 4-Expresses basic 75 - 89% of the time/requires cueing 10 - 24% of the time. Needs helper to occlude trach/needs to repeat words.  Social Interaction Social Interaction: 4-Interacts appropriately 75 - 89% of the time - Needs redirection for appropriate language or to initiate interaction.  Problem Solving Problem Solving: 3-Solves basic 50 - 74% of the time/requires cueing 25 - 49% of the time  Memory Memory: 4-Recognizes or recalls 75 - 89% of the time/requires cueing 10 - 24% of the time  Medical Problem List and Plan: 1. Functional deficits secondary to right temporal intraparenchymal hemorrhage secondary to hypertensive crisis- encephalopathy related to [place], lethargy likely multifactorial including CVA effect , Keppra D/Ced 8/11 but may take a couple days to take  effect, consider methylphenidate, no new neuro deficits  2. DVT Prophylaxis/Anticoagulation: Subcutaneous heparin initiated 03/25/2015. Monitor for a bleeding episodes 3. Pain Management: Voltaren gel 4 times a day to left wrist  4. Seizure prophylaxis. Keppra 500 mg twice a day. 2nd EEG negative for seizure,discussed with Neuro,   recheck EEG  no focal epileptiform activity we can D/C Keppra 5. Neuropsych: This patient is capable of making decisions on her own behalf.s 6. Skin/Wound Care: Routine skin checks 7. Fluids/Electrolytes/Nutrition: Routine I&O, protein malnutrition, supplement 8. Hypertension. Cozaar 100 mg daily. Monitor with increased mobility, currently controlled 141/67 9. Hypokalemia. resolved 10. New bradycardia/ afib: HR at target particularly with increased exertion in therapy. No anticoag (except Subq hep for DVT prophyl) due to hemmorhagic CVA  LOS (Days) 7 A FACE TO FACE EVALUATION WAS PERFORMED  KIRSTEINS,ANDREW E 04/05/2015, 9:35 AM

## 2015-04-06 ENCOUNTER — Inpatient Hospital Stay (HOSPITAL_COMMUNITY): Payer: Medicare Other | Admitting: Speech Pathology

## 2015-04-06 ENCOUNTER — Inpatient Hospital Stay (HOSPITAL_COMMUNITY): Payer: Medicare Other | Admitting: Occupational Therapy

## 2015-04-06 ENCOUNTER — Inpatient Hospital Stay (HOSPITAL_COMMUNITY): Payer: Medicare Other | Admitting: Physical Therapy

## 2015-04-06 NOTE — Progress Notes (Signed)
Physical Therapy Session Note  Patient Details  Name: [name] MRN: [id number] Date of Birth: 1918/06/19  Today's Date: 04/06/2015 PT Individual Time: 1505-1540 PT Individual Time Calculation (min): 35 min   Short Term Goals: Week 2:  PT Short Term Goal 1 (Week 2): Pt will demonstrate supine<>sit and scooting in bed with supervision.  PT Short Term Goal 2 (Week 2): Pt will demonstrate sit<>stand from a variety of surfaces with mod I PT Short Term Goal 3 (Week 2): Pt will ambulate 120 feet with RW and min A PT Short Term Goal 4 (Week 2): Pt will propel w/c 150' with supervision  Skilled Therapeutic Interventions/Progress Updates:    Pt received seated in w/c with daughter present. Performed w/c mobility x130' in controlled environment with BLE's with supervision for majority of trial, occasional min A to restart LE propulsion due to pt distraction; min cueing for obstacle negotiation. Planned to perform gait training; however, upon performing sit > stand to RW, pt reporting feeling "dizzy and headachy". Vital signs in seated: BP 125/76, HR 82, SpO2 99%. Standing: BP 100/81. HR 98, SpO2 94% with pt reporting,"I don't feel good. I'm so tired. Let me sit down." Despite max encouragement to pariticpate in therapy, pt continuing to report being "too tired." Transported pt back to room in w/c, where pt was left seated in w/c with quick release belt in place for safety and al needs within reach. Notified RN of missed therapy, vitals.  Therapy Documentation Precautions:  Precautions Precautions: Fall Precaution Comments: Seizure precautions Restrictions Weight Bearing Restrictions: No General: PT Amount of Missed Time (min): 10 Minutes PT Missed Treatment Reason: Patient fatigue Pain: Pain Assessment Pain Assessment: No/denies pain Locomotion : Wheelchair Mobility Distance: 130   Therapy/Group: Individual Therapy  Hobble, Malva Cogan 04/06/2015, 3:57 PM

## 2015-04-06 NOTE — Progress Notes (Signed)
Occupational Therapy Session Note  Patient Details  Name: [name] MRN: [id number] Date of Birth: 1917/11/18  Today's Date: 04/06/2015 OT Individual Time: 1415-1445 OT Individual Time Calculation (min): 30 min    Short Term Goals: Week 1:  OT Short Term Goal 1 (Week 1): Pt will perform sit <>stand with mod A during self care. OT Short Term Goal 2 (Week 1): Pt will perform shower transfer with mod A in order to increase I in functional transfers. OT Short Term Goal 3 (Week 1): Pt will perform LB dressing with max A in order to decrease level of assist with self care.  OT Short Term Goal 4 (Week 1): Pt will perform UB dressing with min A in order to increase level of assist with self care.   Skilled Therapeutic Interventions/Progress Updates: Patient completed endurance activities, including standing at sink to brush teeth.   Patient was able to stand approximately 7 minutes to brush and rinse mouth before she stated she could stand no longer.     Therapy Documentation Precautions:  Precautions Precautions: Fall Precaution Comments: Seizure precautions Restrictions Weight Bearing Restrictions: No  Pain: Pain Assessment Pain Assessment: No/denies pain See FIM for current functional status  Therapy/Group: Individual Therapy  Alfredia Ferguson [facility] 04/06/2015, 3:53 PM

## 2015-04-06 NOTE — Progress Notes (Signed)
Patient woke this morning with confusion (when woken for vital signs), pleasantly confused; patient unaware of where she was, who staff was, what time of day it was.  Patient reoriented but had little affect.  Patient needed a lot of comfort and support; stated "I'm scared," "Am I okay?"  Patient remained confused for remainder of shift, will pass to day shift RN.

## 2015-04-06 NOTE — Progress Notes (Signed)
Subjective:  No complaints.  Objective: BP 100/81 mmHg  Pulse 98  Temp(Src) 97.9 F (36.6 C) (Oral)  Resp 18  Ht 5\' 3"  (1.6 m)  Wt 158 lb (71.668 kg)  BMI 28.00 kg/m2  SpO2 94%  Elderly female in no acute distress. HEENT: Atraumatic, normocephalic extraocular muscles are intact Neck is supple Chest is clear to auscultation Cardiac exam S1 and S2 are irregular Abdominal exam active bowel sounds, soft Extremities without edema Alert, cooperative, confused  Assessment and plan: Medical Problem List and Plan: 1. Functional deficits secondary to right temporal intraparenchymal hemorrhage secondary to hypertensive crisis 2. DVT Prophylaxis/Anticoagulation: Subcutaneous heparin initiated 03/25/2015. Monitor for a bleeding episodes 3. Pain Management: Voltaren gel 4 times a day to affected areas for OA. 4. Seizure prophylaxis. Keppra 500 mg twice a day. EEG negative for seizure 5. Skin/Wound Care: Routine skin checks 6. Fluids/Electrolytes/Nutrition: Routine I&O follow-up chemistries 7. Hypertension. Cozaar 100 mg daily. Monitor with increased mobility     134/65-156/72       8. Hypokalemia. resolved  Lab Results  Component Value Date   K 4.0 04/01/2015    10. Bradycardia/ afib: need to monitor HR daily, particularly with increased exertion in therapy.

## 2015-04-06 NOTE — Progress Notes (Signed)
Speech Language Pathology Daily Session Note  Patient Details  Name: [name] MRN: [id number] Date of Birth: 1917-09-16  Today's Date: 04/06/2015 SLP Individual Time: 1300-1400 SLP Individual Time Calculation (min): 60 min  Short Term Goals: Week 2: SLP Short Term Goal 1 (Week 2): Patient will demonstrate efficient mastication of Dys. 3 textures without overt s/s of aspiration over 2 consecutive sessions with Min A verbal cues.  SLP Short Term Goal 2 (Week 2): Patient will consume current diet with minimal overt s/s of aspiration with supervision verbal cues for use of swallowing compensatory strategies.  SLP Short Term Goal 3 (Week 2): Patient will demonstrate functional problem solving for basic and familiar tasks with Min A verbal cues.  SLP Short Term Goal 4 (Week 2): Patient will utilize call bell to request assistance in 50% of observable opportunities with Mod A question cues.  SLP Short Term Goal 5 (Week 2): Patient will sustain attention to a functional task for 10 minutes with Min A verbal cues.  SLP Short Term Goal 6 (Week 2): Patient will utilize external memory aids to increase orientation with supervision verbal and question cues.   Skilled Therapeutic Interventions: Skilled treatment session focused on addressing cognition goals. Upon SLP arrival patient had just finished lunch; as a result, dysphagia goals were not addressed this session.  SLP facilitated session by providing Min question cues for use of calendar to assist with recall of date.  Patient was able to sustain attention to a categorical naming task for 8-10 minutes with Min verbal cues for redirection to category.  Patient required Mod verbal and visual cues to return demonstration of call bell use at end of session.  Continue with current plan of care.   FIM:  Comprehension Comprehension Mode: Auditory Comprehension: 3-Understands basic 50 - 74% of the time/requires cueing 25 - 50%  of the  time Expression Expression Mode: Verbal Expression: 4-Expresses basic 75 - 89% of the time/requires cueing 10 - 24% of the time. Needs helper to occlude trach/needs to repeat words. Social Interaction Social Interaction: 4-Interacts appropriately 75 - 89% of the time - Needs redirection for appropriate language or to initiate interaction. Problem Solving Problem Solving: 2-Solves basic 25 - 49% of the time - needs direction more than half the time to initiate, plan or complete simple activities Memory Memory: 2-Recognizes or recalls 25 - 49% of the time/requires cueing 51 - 75% of the time  Pain Pain Assessment Pain Assessment: No/denies pain  Therapy/Group: Individual Therapy  Carmelia Roller., [place] [phone]  Gregory 04/06/2015, 3:50 PM

## 2015-04-07 ENCOUNTER — Inpatient Hospital Stay (HOSPITAL_COMMUNITY): Payer: Medicare Other | Admitting: Occupational Therapy

## 2015-04-07 ENCOUNTER — Inpatient Hospital Stay (HOSPITAL_COMMUNITY): Payer: Self-pay | Admitting: Rehabilitation

## 2015-04-07 NOTE — Progress Notes (Addendum)
Occupational Therapy Session Note  Patient Details  Name: [name] MRN: [id number] Date of Birth: 1917-09-06  Today's Date: 04/07/2015 OT Individual Time:  - ..0900-1000  (17min)  1st session   : 04/07/2015   -   Pain:  None.   1st session:  Pt lying in bed.  Verbalized her years of marriage and her daughter, Pamala Hurry who died of BR Ca.  She became very sad and teary. Offered sympathy.  Addressed the following:  activity tolerance, balance, energy conservation.   Pt verbalized about her uncle whom she was named.  She reported her family is from [place] and discussed her name in length.  Needed redirection to take initiative on her tasks at hand. Transferred to toilet to shower bench with max assist.  Needed assist with sit to stand and vice versa.  Educated on control with stand to sit.  Pt completed bathing and dressing.  SEE FIM.  Left pt in bed with all needs in reach.       Time:  1630-1715  (45 min)  2nd session       Pain:none Individual session: Pt. Transferred to toilet using grab bars with mod assist.  Had urine but no BM.  Transferred back to wc .  Performed LB undressing with max assist.   Needed cues for organizing.  She transferred to bed with mod assist and left with all needs in place.  Bed alarm on.      Short Term Goals: Week 1:  OT Short Term Goal 1 (Week 1): Pt will perform sit <>stand with mod A during self care. OT Short Term Goal 2 (Week 1): Pt will perform shower transfer with mod A in order to increase I in functional transfers. OT Short Term Goal 3 (Week 1): Pt will perform LB dressing with max A in order to decrease level of assist with self care.  OT Short Term Goal 4 (Week 1): Pt will perform UB dressing with min A in order to increase level of assist with self care.            Therapy Documentation Precautions:  Precautions Precautions: Fall Precaution Comments: Seizure precautions Restrictions Weight Bearing Restrictions: No         See FIM for  current functional status  Therapy/Group: Individual Therapy  Lisa Roca 04/07/2015, 7:53 AM

## 2015-04-07 NOTE — Progress Notes (Signed)
Physical Therapy Session Note  Patient Details  Name: [name] MRN: [id number] Date of Birth: [birth year]  Today's Date: 04/07/2015 PT Individual Time: 1500-1600 PT Individual Time Calculation (min): 60 min   Short Term Goals: Week 1:  PT Short Term Goal 1 (Week 1): Pt will perform bed mobility with Mod A PT Short Term Goal 1 - Progress (Week 1): Met PT Short Term Goal 2 (Week 1): Pt will perform transfers with Mod A PT Short Term Goal 2 - Progress (Week 1): Met PT Short Term Goal 3 (Week 1): Pt will ambulate 64' with LRAD and Max A PT Short Term Goal 3 - Progress (Week 1): Met PT Short Term Goal 4 (Week 1): Pt will perform w/c propulsion 15' with Max A PT Short Term Goal 4 - Progress (Week 1): Met  Skilled Therapeutic Interventions/Progress Updates:   Pt received lying in bed, friends had also just arrived in room.  Pt very much engaged in conversation with friends and was willing to work with therapy.  Required frequent re-direction during tasks as she was easily distracted with conversation with visitors.  Once at EOB, retrieved new pair of TEDs and assisted with donning them as well as shoes for time management.  Pt stating needing to use restroom prior to leaving room, therefore ambulated to/from restroom with RW at min A level with cues for posture, positioning inside of RW and safety in restroom.  Pt able to manage clothing prior to toileting and peri care, however requires assist to pull brief up.  Ambulated to sink to wash/dry hands with steady A and cues for sequencing as well as to brush her hair.  Assisted pt to/from therapy gym for time management and energy conservation to engage in gait training with use of RW and car transfer to prepare for possible D/C home.  Performed gait x 2 reps of 80' at min A level.  Provided light tactile cues for posture and assist at times for steering RW and continuous cues for position inside of RW.  Note that LLE tends to drag with increased  fatigue.  Performed car transfer to simulated home car height at min A level with cues for safe sequencing.  Ambulated back to w/c in therapy gym as above.  Again, pt needing increased rest breaks between activity and needs max cues at times for re-direction and attention to task.   Assisted back to room and left in w/c with QRB donned and all needs in reach.   Therapy Documentation Precautions:  Precautions Precautions: Fall Precaution Comments: Seizure precautions Restrictions Weight Bearing Restrictions: No   Vital Signs: Therapy Vitals Temp: 98.3 F (36.8 C) Temp Source: Oral Pulse Rate: 92 Resp: 20 BP: (!) 114/57 mmHg Patient Position (if appropriate): Lying Oxygen Therapy SpO2: 100 % O2 Device: Not Delivered Pain: Pt with no c/o pain during session.    Locomotion : Ambulation Ambulation/Gait Assistance: 4: Min assist   See FIM for current functional status  Therapy/Group: Individual Therapy  Denice Bors 04/07/2015, 4:29 PM

## 2015-04-07 NOTE — Progress Notes (Signed)
Subjective:  No complaints. Slept ok  Objective: BP 144/68 mmHg  Pulse 85  Temp(Src) 97.7 F (36.5 C) (Oral)  Resp 17  Ht 5\' 3"  (1.6 m)  Wt 158 lb (71.668 kg)  BMI 28.00 kg/m2  SpO2 97%  Elderly female in no acute distress. HEENT: Atraumatic, normocephalic extraocular muscles are intact Neck is supple Chest is clear to auscultation Cardiac exam S1 and S2 are irregular Abdominal exam active bowel sounds, soft Extremities without edema Alert, cooperative  Assessment and plan: Medical Problem List and Plan: 1. Functional deficits secondary to right temporal intraparenchymal hemorrhage secondary to hypertensive crisis 2. DVT Prophylaxis/Anticoagulation: Subcutaneous heparin initiated 03/25/2015. Monitor for a bleeding episodes 3. Pain Management: Voltaren gel 4 times a day to affected areas for OA. 4. Seizure prophylaxis. Keppra 500 mg twice a day. EEG negative for seizure 5. Skin/Wound Care: Routine skin checks 6. Fluids/Electrolytes/Nutrition: Routine I&O follow-up chemistries 7. Hypertension. Cozaar 100 mg daily. Monitor with increased mobility     134/65-156/72       8. Hypokalemia. resolved  Lab Results  Component Value Date   K 4.0 04/01/2015    10. Bradycardia/ afib: need to monitor HR daily, particularly with increased exertion in therapy.   Cont current Rx

## 2015-04-08 ENCOUNTER — Inpatient Hospital Stay (HOSPITAL_COMMUNITY): Payer: Medicare Other | Admitting: Speech Pathology

## 2015-04-08 ENCOUNTER — Inpatient Hospital Stay (HOSPITAL_COMMUNITY): Payer: Medicare Other

## 2015-04-08 ENCOUNTER — Inpatient Hospital Stay (HOSPITAL_COMMUNITY): Payer: Self-pay | Admitting: Physical Therapy

## 2015-04-08 NOTE — Progress Notes (Signed)
Subjective/Complaints: Up with therapy. Uneventful night. ROS: Pt denies fever, rash/itching, headache, blurred or double vision, nausea, vomiting, abdominal pain, diarrhea, chest pain, shortness of breath, palpitations, dysuria, dizziness, neck or back pain, bleeding, anxiety, or depression   Objective: Vital Signs: Blood pressure 122/62, pulse 75, temperature 98 F (36.7 C), temperature source Oral, resp. rate 18, height 5\' 3"  (1.6 m), weight 71.668 kg (158 lb), SpO2 98 %. No results found. No results found for this or any previous visit (from the past 72 hour(s)).   HEENT: normal Cardio: RRR and no murmur Resp: CTA B/L and unlabored GI: BS positive and NT, ND Extremity:  No Edema Skin:   Intact Neuro: Flat, Abnormal Sensory parathesia to LT in RLE and 4 to 4+/5 Motor Musc/Skel:  Other Bilateral Hallux valgus deformities. Gen NAD   Assessment/Plan: 1. Functional deficits secondary to  Left Temporal ICH due to hypertensive crisis which require 3+ hours per day of interdisciplinary therapy in a comprehensive inpatient rehab setting. Physiatrist is providing close team supervision and 24 hour management of active medical problems listed below. Physiatrist and rehab team continue to assess barriers to discharge/monitor patient progress toward functional and medical goals.  FIM: FIM - Bathing Bathing Steps Patient Completed: Chest, Right Arm, Left Arm, Abdomen, Front perineal area, Buttocks, Right upper leg, Left upper leg Bathing: 4: Min-Patient completes 8-9 22f 10 parts or 75+ percent  FIM - Upper Body Dressing/Undressing Upper body dressing/undressing steps patient completed: Thread/unthread right sleeve of pullover shirt/dresss, Thread/unthread left sleeve of pullover shirt/dress, Put head through opening of pull over shirt/dress, Pull shirt over trunk, Thread/unthread left bra strap, Thread/unthread right bra strap Upper body dressing/undressing: 4: Min-Patient completed 75  plus % of tasks FIM - Lower Body Dressing/Undressing Lower body dressing/undressing steps patient completed: Thread/unthread right pants leg, Thread/unthread left pants leg Lower body dressing/undressing: 2: Max-Patient completed 25-49% of tasks  FIM - Toileting Toileting steps completed by patient: Adjust clothing prior to toileting, Performs perineal hygiene Toileting Assistive Devices: Grab bar or rail for support Toileting: 3: Mod-Patient completed 2 of 3 steps  FIM - Radio producer Devices: Grab bars, Insurance account manager Transfers: 4-To toilet/BSC: Min A (steadying Pt. > 75%), 4-From toilet/BSC: Min A (steadying Pt. > 75%)  FIM - Bed/Chair Transfer Bed/Chair Transfer Assistive Devices: Arm rests Bed/Chair Transfer: 5: Supine > Sit: Supervision (verbal cues/safety issues)  FIM - Locomotion: Wheelchair Distance: 130 Locomotion: Wheelchair: 0: Activity did not occur FIM - Locomotion: Ambulation Locomotion: Ambulation Assistive Devices: Administrator Ambulation/Gait Assistance: 4: Min assist Locomotion: Ambulation: 2: Travels 50 - 149 ft with minimal assistance (Pt.>75%)  Comprehension Comprehension Mode: Auditory Comprehension: 3-Understands basic 50 - 74% of the time/requires cueing 25 - 50%  of the time  Expression Expression Mode: Verbal Expression: 4-Expresses basic 75 - 89% of the time/requires cueing 10 - 24% of the time. Needs helper to occlude trach/needs to repeat words.  Social Interaction Social Interaction: 4-Interacts appropriately 75 - 89% of the time - Needs redirection for appropriate language or to initiate interaction.  Problem Solving Problem Solving: 2-Solves basic 25 - 49% of the time - needs direction more than half the time to initiate, plan or complete simple activities  Memory Memory: 2-Recognizes or recalls 25 - 49% of the time/requires cueing 51 - 75% of the time  Medical Problem List and Plan: 1. Functional deficits  secondary to right temporal intraparenchymal hemorrhage secondary to hypertensive crisis- encephalopathy related to [place], lethargy likely multifactorial including  CVA effect  -more alert after Keppra D/Ced 8/11  -consider methylphenidate  2. DVT Prophylaxis/Anticoagulation: Subcutaneous heparin initiated 03/25/2015. Monitor for a bleeding episodes 3. Pain Management: Voltaren gel 4 times a day to left wrist  4. Seizure prophylaxis. Keppra 500 mg twice a day. 2nd EEG negative for seizure,discussed with Neuro,   recheck EEG  no focal epileptiform activity we can D/C Keppra 5. Neuropsych: This patient is capable of making decisions on her own behalf.s 6. Skin/Wound Care: Routine skin checks 7. Fluids/Electrolytes/Nutrition: Routine I&O, protein malnutrition  -eating quite well now 8. Hypertension. Cozaar 100 mg daily. Monitor with increased mobility, currently under control 9. Hypokalemia. resolved 10. New bradycardia/ afib:HR reasonable even with exertion  - No anticoag (except Subq hep for DVT prophyl) due to hemmorhagic CVA  LOS (Days) 10 A FACE TO FACE EVALUATION WAS PERFORMED  Zayin Valadez T 04/08/2015, 8:33 AM

## 2015-04-08 NOTE — Progress Notes (Signed)
Speech Language Pathology Daily Session Note  Patient Details  Name: [name] MRN: [id number] Date of Birth: May 18, 1918  Today's Date: 04/08/2015 SLP Individual Time: 0750-0850 SLP Individual Time Calculation (min): 60 min  Short Term Goals: Week 2: SLP Short Term Goal 1 (Week 2): Patient will demonstrate efficient mastication of Dys. 3 textures without overt s/s of aspiration over 2 consecutive sessions with Min A verbal cues.  SLP Short Term Goal 2 (Week 2): Patient will consume current diet with minimal overt s/s of aspiration with supervision verbal cues for use of swallowing compensatory strategies.  SLP Short Term Goal 3 (Week 2): Patient will demonstrate functional problem solving for basic and familiar tasks with Min A verbal cues.  SLP Short Term Goal 4 (Week 2): Patient will utilize call bell to request assistance in 50% of observable opportunities with Mod A question cues.  SLP Short Term Goal 5 (Week 2): Patient will sustain attention to a functional task for 10 minutes with Min A verbal cues.  SLP Short Term Goal 6 (Week 2): Patient will utilize external memory aids to increase orientation with supervision verbal and question cues.   Skilled Therapeutic Interventions: Skilled treatment session focused on dysphagia and cognitive goals. SLP facilitated session by providing skilled observation with breakfast meal of Dys. 2 textures with thin liquids. Patient consumed meal without overt s/s of aspiration and required supervision verbal cues for use of small bites. Patient continues to demonstrate fatigue throughout the session and reported, "I am already tired just from eating that small amount," therefore, recommend patient continue Dys. 2 textures for energy conservation.  Patient required extra time and Mod A verbal cues for use of left upper extremity for tray set-up and self-feeding. Patient independently requested to use the bathroom and required Min A verbal cues for problem  solving with task. Patient left upright in wheelchair with quick release belt in place and all needs within reach. Continue with current plan of care.    FIM:  Comprehension Comprehension Mode: Auditory Comprehension: 4-Understands basic 75 - 89% of the time/requires cueing 10 - 24% of the time Expression Expression: 5-Expresses basic needs/ideas: With extra time/assistive device Social Interaction Social Interaction: 4-Interacts appropriately 75 - 89% of the time - Needs redirection for appropriate language or to initiate interaction. Problem Solving Problem Solving: 4-Solves basic 75 - 89% of the time/requires cueing 10 - 24% of the time Memory Memory: 3-Recognizes or recalls 50 - 74% of the time/requires cueing 25 - 49% of the time FIM - Eating Eating Activity: 5: Supervision/cues;5: Set-up assist for open containers  Pain Pain Assessment Pain Assessment: No/denies pain  Therapy/Group: Individual Therapy  Adonis Yim 04/08/2015, 8:54 AM

## 2015-04-08 NOTE — Progress Notes (Signed)
Occupational Therapy Session Note  Patient Details  Name: [name] MRN: [id number] Date of Birth: 04-07-1918  Today's Date: 04/08/2015 OT Individual Time: 1000-1100 OT Individual Time Calculation (min): 60 min    Short Term Goals: Week 1:  OT Short Term Goal 1 (Week 1): Pt will perform sit <>stand with mod A during self care. OT Short Term Goal 2 (Week 1): Pt will perform shower transfer with mod A in order to increase I in functional transfers. OT Short Term Goal 3 (Week 1): Pt will perform LB dressing with max A in order to decrease level of assist with self care.  OT Short Term Goal 4 (Week 1): Pt will perform UB dressing with min A in order to increase level of assist with self care.   Skilled Therapeutic Interventions/Progress Updates:    Pt resting in w/c upon arrival.  Pt required max verbal cues for orientation to time of day.  Pt thought is was late in the afternoon and was surprised when informed that it was only mid-morning.  Pt became upset when she realized that she was so confused.  Pt selected clothing for the day.  Pt stated that she had showered the previous day and only needed to change clothing.  Pt engaged in dressing tasks with sit<>stand from w/c at sink.  Pt required min A for sit<>stand and steady A for standing balance.  Pt required more than a reasonable amount of time to complete tasks with multiple rest breaks.  Pt commented on her fatigue numerous time.  This is an area of concern for patient.  Pt requested to return to bed after dressing and performed SPT with steady A.  Focus on activity tolerance, orientation to time, sit<>stand, standing balance, BADLs, and safety awareness to increase independence with BADLs.  Therapy Documentation Precautions:  Precautions Precautions: Fall Precaution Comments: Seizure precautions Restrictions Weight Bearing Restrictions: No   Pain: Pain Assessment Pain Assessment: No/denies pain  See FIM for current functional  status  Therapy/Group: Individual Therapy  Leroy Libman 04/08/2015, 12:21 PM

## 2015-04-08 NOTE — Progress Notes (Signed)
Physical Therapy Session Note  Patient Details  Name: GRISELA MESCH MRN: [id number] Date of Birth: [birth year]  Today's Date: 04/08/2015 PT Individual Time: 1300-1400 PT Individual Time Calculation (min): 60 min   Short Term Goals: Week 2:  PT Short Term Goal 1 (Week 2): Pt will demonstrate supine<>sit and scooting in bed with supervision.  PT Short Term Goal 2 (Week 2): Pt will demonstrate sit<>stand from a variety of surfaces with mod I PT Short Term Goal 3 (Week 2): Pt will ambulate 120 feet with RW and min A PT Short Term Goal 4 (Week 2): Pt will propel w/c 150' with supervision  Skilled Therapeutic Interventions/Progress Updates:    Therapeutic Activity: Pt transitioned from supine>sitting EOB with supervision and verbal cues to scoot to EOB. PT instructed pt in sit>stand from EOB with RW with verbal/tactile cues for hand placement. Pt continues to require cuing for safe hand placement 90% of the time. PT instructed pt in dynamic standing balance at sink for oral care while standing. Pt tolerated standing x6 minutes with intermittent UE support. PT instructed in dynamic standing balance activity connecting numbers on a mirror for increased endurance and upright posture. Pt performed x3 with steady A for balance.   W/C: PT instructed patient in w/c propulsion x150 feet using BUEs with verbal cues for steering and equal R/L UE propulsion.  Gait Training: PT instructed patient in ambulation x150' with RW and steady A/min guard. Pt requires frequent verbal cues for walker position, upright posture and longer step length.  Pt continues to c/o feeling "so tired" and requesting to lie down several times throughout therapy though this seems to be improving compared to initial sessions.    Therapy Documentation Precautions:  Precautions Precautions: Fall Precaution Comments: Seizure precautions Restrictions Weight Bearing Restrictions: No  Pain: Pain Assessment Pain Assessment:  No/denies pain  Locomotion : Wheelchair Mobility Distance: 150    See FIM for current functional status  Therapy/Group: Individual Therapy  Thecla Forgione E Penven-Crew 04/08/2015, 1:32 PM

## 2015-04-09 ENCOUNTER — Inpatient Hospital Stay (HOSPITAL_COMMUNITY): Payer: Medicare Other

## 2015-04-09 ENCOUNTER — Inpatient Hospital Stay (HOSPITAL_COMMUNITY): Payer: Self-pay | Admitting: Physical Therapy

## 2015-04-09 ENCOUNTER — Inpatient Hospital Stay (HOSPITAL_COMMUNITY): Payer: Medicare Other | Admitting: Speech Pathology

## 2015-04-09 DIAGNOSIS — M1711 Unilateral primary osteoarthritis, right knee: Secondary | ICD-10-CM

## 2015-04-09 NOTE — Progress Notes (Signed)
   Subjective/Complaints: Right knee really bothering her. Had problems with it at home. voltaren gel doesn't seem to be helping. Has had injections to knee in the past.  ROS: Pt denies fever, rash/itching, headache, blurred or double vision, nausea, vomiting, abdominal pain, diarrhea, chest pain, shortness of breath, palpitations, dysuria, dizziness, neck or back pain, bleeding, anxiety, or depression   Objective: Vital Signs: Blood pressure 157/82, pulse 81, temperature 97.7 F (36.5 C), temperature source Oral, resp. rate 17, height 5\' 3"  (1.6 m), weight 71.668 kg (158 lb), SpO2 98 %. No results found. No results found for this or any previous visit (from the past 72 hour(s)).   HEENT: normal Cardio: RRR and no murmur Resp: CTA B/L and unlabored GI: BS positive and NT, ND Extremity:  No Edema Skin:   Intact Neuro: Flat, Abnormal Sensory parathesia to LT in RLE and 4 to 4+/5 Motor Musc/Skel:  Other Bilateral Hallux valgus deformities. Gen NAD   Assessment/Plan: 1. Functional deficits secondary to  Left Temporal ICH due to hypertensive crisis which require 3+ hours per day of interdisciplinary therapy in a comprehensive inpatient rehab setting. Physiatrist is providing close team supervision and 24 hour management of active medical problems listed below. Physiatrist and rehab team continue to assess barriers to discharge/monitor patient progress toward functional and medical goals.  FIM:    Function- Upper Body Dressing/Undressing What is the patient wearing?: Thread/unthread right sleeve of pullover shirt/dresss, Thread/unthread left sleeve of pullover shirt/dress, Put head through opening of pull over shirt/dress, Pull shirt over trunk, Thread/unthread left bra strap, Thread/unthread right bra strap Function - Lower Body Dressing/Undressing Lower body dressing/undressing activity did not occur: 2: Max-Patient completed 25-49% of tasks What is the patient wearing?:  Thread/unthread right pants leg, Thread/unthread left pants leg  Function - Toileting Toileting steps completed by patient: Adjust clothing prior to toileting, Performs perineal hygiene Toileting Assistive Devices: Grab bar or rail for support  Function - Air cabin crew transfer assistive device: Grab bars, Environmental consultant  Function - Chair/bed transfer Chair/bed transfer assistive device: Arm rests, Orthoptist - Comprehension Comprehension: Auditory  Function - Expression Expression: Verbal           Medical Problem List and Plan: 1. Functional deficits secondary to right temporal intraparenchymal hemorrhage secondary to hypertensive crisis- encephalopathy related to [place], lethargy likely multifactorial including CVA effect  -more alert after Keppra D/Ced 8/11    2. DVT Prophylaxis/Anticoagulation: Subcutaneous heparin initiated 03/25/2015. Monitor for a bleeding episodes 3. Pain Management: Voltaren gel 4 times a day to right knee, scheduled ice  -could consider injection if persistently an issue--no nsaids due to bleeding risk  -will observe discuss with PT as well 4. Seizure prophylaxis.    2nd EEG negative for seizure,discussed with Neuro--keppra stopped 5. Neuropsych: This patient is capable of making decisions on her own behalf.s 6. Skin/Wound Care: Routine skin checks 7. Fluids/Electrolytes/Nutrition: Routine I&O, protein malnutrition  -eating quite well now 8. Hypertension. Cozaar 100 mg daily. Monitor with increased mobility, currently under control 9. Hypokalemia. resolved 10. New bradycardia/ afib:HR reasonable even with exertion  - No anticoag (except Subq hep for DVT prophyl) due to hemmorhagic CVA  LOS (Days) 11 A FACE TO FACE EVALUATION WAS PERFORMED  SWARTZ,ZACHARY T 04/09/2015, 8:35 AM

## 2015-04-09 NOTE — Progress Notes (Signed)
Physical Therapy Session Note  Patient Details  Name: [name] MRN: [id number] Date of Birth: 07-Oct-1917  Today's Date: 04/09/2015 PT Individual Time: 1520-1605 PT Individual Time Calculation (min): 45 min   Short Term Goals: Week 2:  PT Short Term Goal 1 (Week 2): Pt will demonstrate supine<>sit and scooting in bed with supervision.  PT Short Term Goal 2 (Week 2): Pt will demonstrate sit<>stand from a variety of surfaces with mod I PT Short Term Goal 3 (Week 2): Pt will ambulate 120 feet with RW and min A PT Short Term Goal 4 (Week 2): Pt will propel w/c 150' with supervision  Skilled Therapeutic Interventions/Progress Updates:    Gait Training: Pt received supine in bed. PT donned TEDs and shoes for time management. Pt transitioned to sitting EOB mod I. Pt transitioned from sit>stand x2 with RW and supervision, demonstrating appropriate placement of hands. PT instructed patient in ambulation with RW x150' with supervision and verbal cues for step length, foot clearance, upright posture, and walker positioning. PT instructed patient in negotiation of 12 steps with 2 hand rails with min A faded to steady A. PT provided demonstration and verbal cues for sequencing and posture throughout stair training. Pt tolerated session well, returned to room in w/c and left sitting in w/c with call bell in reach and needs met.   Therapy Documentation Precautions:  Precautions Precautions: Fall Precaution Comments: Seizure precautions Restrictions Weight Bearing Restrictions: No  Pain: Pain Assessment Pain Assessment: Faces Faces Pain Scale: Hurts a little bit Pain Location: Knee Pain Orientation: Right Pain Descriptors / Indicators: Aching Pain Intervention(s): Emotional support   Function:   Bed Mobility Roll left and right activity   Assist level: No help, No cues, assistive device, takes more than a reasonable amount of time  Sit to lying activity Sit to lying acitivty did not  occur:  (Pt left sitting in w/c at end of session)    Lying to sitting activity   Assist level: No help, No cues, assistive device, takes more than a reasonable amount of time  Mobility details     Transfers Sit to stand transfer   Sit to stand assist level: Supervision or verbal cues Sit to stand assistive device: Walker  Chair/bed transfer   Chair/bed transfer method: Ambulatory Chair/bed transfer assist level: Supervision or verbal cues Chair/bed transfer assistive device: Walker   Chair/bed transfer details: Designer, fashion/clothing for Youth worker device: Walker-rolling Max distance: 150 Assist level: Supervision or verbal cues  Walk 10 feet activity   Assist level: Supervision or verbal cues  Walk 50 feet with 2 turns activity   Assist level: Supervision or verbal cues  Walk 150 feet activity   Assist level: Supervision or verbal cues  Walk 10 feet on uneven surfaces activity      Stairs   Stairs assistive device: 2 hand rails Max number of stairs: 12 Stairs assist level: Touching or steadying assistance (Pt > 75%)  Walk up/down 1 step activity     Walk up/down 1 step (curb) assist level: Touching or steadying assistance (Pt > 75%)  Walk up/down 4 steps activity   Walk up/down 4 steps assist level: Touching or steadying assistance (Pt > 75%)  Walk up/down 12 steps activity   Walk up/down 12 steps assist level: Touching or steadying assistance (Pt > 75%)  Pick  up small objects from floor      Wheelchair          Wheel 50 feet with 2 turns activity      Wheel 150 feet activity       Cognition Comprehension Comprehension assist level: Understands basic 75 - 89% of the time/ requires cueing 10 - 24% of the time  Expression Expression assist level: Expresses basic 90% of the time/requires cueing < 10% of the time.  Social Interaction Social Interaction assist level:  Interacts appropriately 90% of the time - Needs monitoring or encouragement for participation or interaction.  Problem Solving Problem solving assist level: Solves basic 50 - 74% of the time/requires cueing 25 - 49% of the time  Memory Memory assist level: Recognizes or recalls 25 - 49% of the time/requires cueing 50 - 75% of the time    Therapy/Group: Individual Therapy  Earnest Conroy Penven-Crew 04/09/2015, 4:21 PM

## 2015-04-09 NOTE — Plan of Care (Signed)
Problem: RH BOWEL ELIMINATION Goal: RH STG MANAGE BOWEL WITH ASSISTANCE STG Manage Bowel with Assistance. Mod I  Wears brief, incontinent @ times

## 2015-04-09 NOTE — Progress Notes (Signed)
Speech Language Pathology Daily Session Note  Patient Details  Name: [name] MRN: [id number] Date of Birth: August 03, 1918  Today's Date: 04/09/2015 SLP Individual Time: 1000-1100 SLP Individual Time Calculation (min): 60 min  Short Term Goals: Week 2: SLP Short Term Goal 1 (Week 2): Patient will demonstrate efficient mastication of Dys. 3 textures without overt s/s of aspiration over 2 consecutive sessions with Min A verbal cues.  SLP Short Term Goal 2 (Week 2): Patient will consume current diet with minimal overt s/s of aspiration with supervision verbal cues for use of swallowing compensatory strategies.  SLP Short Term Goal 3 (Week 2): Patient will demonstrate functional problem solving for basic and familiar tasks with Min A verbal cues.  SLP Short Term Goal 4 (Week 2): Patient will utilize call bell to request assistance in 50% of observable opportunities with Mod A question cues.  SLP Short Term Goal 5 (Week 2): Patient will sustain attention to a functional task for 10 minutes with Min A verbal cues.  SLP Short Term Goal 6 (Week 2): Patient will utilize external memory aids to increase orientation with supervision verbal and question cues.   Skilled Therapeutic Interventions: Skilled treatment session focused on cognitive goals. Upon arrival, patient was awake while sitting upright in the wheelchair and agreeable to participate in treatment session.  SLP facilitated session by providing Min A verbal and visual cues for orientation to date and Mod A verbal cues with extra time for problem solving and sequencing with 4 step picture cards. Patient demonstrated selective attention to task for ~45 minutes in a mildly distracting environment with Mod A verbal cues needed for redirection. Patient requested to continue to sit-up in wheelchair at end of session. Patient left with all needs within reach and quick release belt in place. Continue with current plan of care.     Function:  Cognition Comprehension Comprehension assist level: Understands basic 75 - 89% of the time/ requires cueing 10 - 24% of the time  Expression   Expression assist level: Expresses basic 90% of the time/requires cueing < 10% of the time.  Social Interaction Social Interaction assist level: Interacts appropriately 90% of the time - Needs monitoring or encouragement for participation or interaction.  Problem Solving Problem solving assist level: Solves basic 50 - 74% of the time/requires cueing 25 - 49% of the time  Memory Memory assist level: Recognizes or recalls 25 - 49% of the time/requires cueing 50 - 75% of the time    Pain Pain Assessment Pain Assessment: No/denies pain  Therapy/Group: Individual Therapy  [name] 04/09/2015, 3:30 PM

## 2015-04-09 NOTE — Progress Notes (Signed)
Occupational Therapy Session Note  Patient Details  Name: [name] MRN: [id number] Date of Birth: Mar 07, 1918  Today's Date: 04/09/2015 OT Individual Time: 0800-0900 OT Individual Time Calculation (min): 60 min    Short Term Goals: Week 1:  OT Short Term Goal 1 (Week 1): Pt will perform sit <>stand with mod A during self care. OT Short Term Goal 2 (Week 1): Pt will perform shower transfer with mod A in order to increase I in functional transfers. OT Short Term Goal 3 (Week 1): Pt will perform LB dressing with max A in order to decrease level of assist with self care.  OT Short Term Goal 4 (Week 1): Pt will perform UB dressing with min A in order to increase level of assist with self care.   Skilled Therapeutic Interventions/Progress Updates:    Pt asleep in bed but easily aroused.  Pt required min verbal cues for initial orientation.  Pt engaged in BADL retraining including bathing and dressing with sit<>stand from w/c at sink.  Pt declined shower this morning, recalling correctly that she had showered two days prior.  Pt stated she didn't shower every day at home.  Pt stood at sink for approx 5 mins to clean periarea and change brief.  Pt performed sit<>stand with supervision and extra time.  Pt requires more than a reasonable amount of time with multiple rest breaks to complete tasks.  Pt continues to c/o increased confusion and fatigue. Focus on activity tolerance, sit<>stand, standing balance, functional transfers and amb with RW, orientation, and safety awareness.  Therapy Documentation Precautions:  Precautions Precautions: Fall Precaution Comments: Seizure precautions Restrictions Weight Bearing Restrictions: No Pain Assessment Pain Assessment: No/denies pain  Function:   Eating Eating Eating activity did not occur: N/A             Grooming Oral Care,Brush Teeth, Clean Dentures Activity:      Assist Level: Supervision or verbal cues;Set up   Set up : To apply  toothpaste  Wash, Rinse, Dry Face Activity   Assist Level: Supervision or verbal cues      Wash, Rinse, Dry Hands Activity   Assist Level: Supervision or verbal cues      Brush, Comb Hair Activity   Assist Level: Supervision or verbal cues    Shave Activity Shave activity did not occur: Patient does not shave        Apply Makeup Activity Apply makeup activity did not occur: N/A                                                           Bathing Bathing position   Position: Wheelchair/chair at sink  Bathing parts Body parts bathed by patient: Right arm;Left arm;Chest;Abdomen;Front perineal area;Buttocks;Right upper leg;Left upper leg Body parts bathed by helper: Right lower leg;Left lower leg;Back  Bathing assist Assist Level: Touching or steadying assistance(Pt > 75%)       Upper Body Dressing/Undressing Upper body dressing   What is the patient wearing?: Pull over shirt/dress     Pull over shirt/dress - Perfomed by patient: Thread/unthread right sleeve;Thread/unthread left sleeve;Put head through opening;Pull shirt over trunk          Upper body assist Assist Level: Supervision or verbal cues       Lower Body Dressing/Undressing Lower body  dressing   What is the patient wearing?: Pants;Non-skid slipper socks;Ted Hose;Underwear Underwear - Performed by patient: Thread/unthread right underwear leg;Thread/unthread left underwear leg Underwear - Performed by helper: Pull underwear up/down Pants- Performed by patient: Thread/unthread right pants leg;Thread/unthread left pants leg;Fasten/unfasten pants   Non-skid slipper socks- Performed by patient: Don/doff right sock;Don/doff left sock                 TED Hose - Performed by helper: Don/doff right TED hose;Don/doff left TED hose  Lower body assist Assist Level: Touching or steadying assistance (Pt > 75%)       Toileting Toileting          Toileting assist      Bed Mobility Roll left and right  activity   Assist level: Supervision or verbal cues  Sit to lying activity Sit to lying acitivty did not occur: N/A    Lying to sitting activity   Assist level: Supervision or verbal cues  Mobility details Bed mobility details: Verbal cues for techniques   Transfers Sit to stand transfer   Sit to stand assist level: Touching or steadying assistance (Pt > 75%/lift 1 leg) Sit to stand assistive device: Walker  Chair/bed transfer   Chair/bed transfer method: Ambulatory Chair/bed transfer assist level: Touching or steadying assistance (Pt > 75%) Chair/bed transfer assistive device: Land Comprehension Comprehension assist level: Understands complex 90% of the time/cues 10% of the time  Expression Expression assist level: Expresses complex 90% of the time/cues < 10% of the time  Social Interaction Social Interaction assist level: Interacts appropriately with others with medication or extra time (anti-anxiety, antidepressant).  Problem Solving    Memory      Therapy/Group: Individual Therapy  Leroy Libman 04/09/2015, 10:04 AM

## 2015-04-09 NOTE — Patient Care Conference (Signed)
Inpatient RehabilitationTeam Conference and Plan of Care Update Date: 04/09/2015   Time: 2:30 PM    Patient Name: [name]      Medical Record Number: [id number]  Date of Birth: 01-26-1918 Sex: Female         Room/Bed: 4W21C/4W21C-01 Payor Info: Payor: [employer] MEDICARE / Plan: [employer] MEDICARE / Product Type: *No Product type* /    Admitting Diagnosis: R T P ICH  Admit Date/Time:  03/29/2015  2:37 PM Admission Comments: No comment available   Primary Diagnosis:  <principal problem not specified> Principal Problem: <principal problem not specified>  Patient Active Problem List   Diagnosis Date Noted  . Atrial fibrillation 03/29/2015  . UTI (urinary tract infection) 03/28/2015  . Altered mental status   . Lethargy   . Leukocytosis   . ICH (intracerebral hemorrhage)   . Seizures   . Brain bleed   . Cerebral hemorrhage   . Cytotoxic brain edema 03/22/2015  . Brain herniation 03/22/2015  . Cerebral parenchymal hemorrhage 03/19/2015  . Dizzy spells 11/10/2014  . SPINAL STENOSIS, CERVICAL 06/11/2010  . Essential hypertension 06/22/2006  . Arthritis of right knee 06/22/2006  . Osteopenia 06/22/2006    Expected Discharge Date: Expected Discharge Date: 04/17/15  Team Members Present: Physician leading conference: Dr. Alger Simons Social Worker Present: Lennart Pall, LCSW Nurse Present: Dorien Chihuahua, RN PT Present: Carney Living, PT;Other (comment) (Caitlin Penven - Crew, PT) OT Present: Salome Spotted, OT;Roanna Epley, COTA SLP Present: Weston Anna, SLP PPS Coordinator present : Daiva Nakayama, RN, CRRN     Current Status/Progress Goal Weekly Team Focus  Medical   improved arousal, still confused. pain issues- right knee, ?low back pain  improve activity tolerance, pain   arousal, mentation, sleep   Bowel/Bladder   Continent bowel, rare episode incontinence urine. No retention.  Managed with timed toileting.  Monitor, cue to anticipate needs   Swallow/Nutrition/  Hydration   Dys. 2 textures with thin liquids, Min A   Supervision with least restrictive diet  trials of upgraded textures    ADL's   bathing-mod A; dressing-mod A; functional transfers-steady A with extra time; continued confusion  BADL-min A; functional transfers-min A  cognitve remediation; activity tolerance, funcitonal transfers, BADLs, safety awareness   Mobility   supervision for bed mobility, transfers and w/c mobility, min A for gait   min a overall  endurance, ambulation, attention   Communication             Safety/Cognition/ Behavioral Observations  Max A  Min A  orientation, attention, recall, problem solving    Pain   Voltaran Gel to right knee effective  2/10  Managed at goal   Skin   CDI, ecchymosis at abd. resolving     monitor, assist to reposition    Rehab Goals Patient on target to meet rehab goals: Yes *See Care Plan and progress notes for long and short-term goals.  Barriers to Discharge: mentation, safety    Possible Resolutions to Barriers:  supervision and support at home    Discharge Planning/Teaching Needs:  home with family able to provide 24/7 support if needed      Team Discussion:  Improved alertness but still with confusion (pt is aware of her confusion).  Pt able to rationalize that she is "out of my routine" and feels this is cause of her confusion.  More alert overall but still fatigues rather easily.  Can consider upgrade of diet as alertness continues to improve.  tx team  feels can d/c earlier that target but will need 24/7 assistance  Revisions to Treatment Plan:  Change in d/c date.   Continued Need for Acute Rehabilitation Level of Care: The patient requires daily medical management by a physician with specialized training in physical medicine and rehabilitation for the following conditions: Daily direction of a multidisciplinary physical rehabilitation program to ensure safe treatment while eliciting the highest outcome that is of  practical value to the patient.: Yes Daily medical management of patient stability for increased activity during participation in an intensive rehabilitation regime.: Yes Daily analysis of laboratory values and/or radiology reports with any subsequent need for medication adjustment of medical intervention for : Neurological problems  [name] 04/10/2015, 9:07 AM

## 2015-04-10 ENCOUNTER — Inpatient Hospital Stay (HOSPITAL_COMMUNITY): Payer: Medicare Other | Admitting: Speech Pathology

## 2015-04-10 ENCOUNTER — Inpatient Hospital Stay (HOSPITAL_COMMUNITY): Payer: Self-pay

## 2015-04-10 ENCOUNTER — Inpatient Hospital Stay (HOSPITAL_COMMUNITY): Payer: Self-pay | Admitting: Physical Therapy

## 2015-04-10 DIAGNOSIS — M25561 Pain in right knee: Secondary | ICD-10-CM

## 2015-04-10 NOTE — Progress Notes (Signed)
Occupational Therapy Session Note  Patient Details  Name: [name] MRN: [id number] Date of Birth: [birth year]  Today's Date: 04/10/2015 OT Individual Time: 0800-0900 OT Individual Time Calculation (min): 60 min    Short Term Goals: Week 1:  OT Short Term Goal 1 (Week 1): Pt will perform sit <>stand with mod A during self care. OT Short Term Goal 2 (Week 1): Pt will perform shower transfer with mod A in order to increase I in functional transfers. OT Short Term Goal 3 (Week 1): Pt will perform LB dressing with max A in order to decrease level of assist with self care.  OT Short Term Goal 4 (Week 1): Pt will perform UB dressing with min A in order to increase level of assist with self care.   Skilled Therapeutic Interventions/Progress Updates:    Pt sitting in bed eating breakfast upon arrival.  Pt agreeable to participating in therapy.  Pt requested use of toilet and amb with RW into bathroom.  Pt returned to sink and completed hygiene tasks including standing for approx 5 mins to complete periarea hygiene.  Pt performed tasks without BUE support.  Pt stated she would take a shower tomorrow and engaged in dressing tasks.  Pt continues to exhibit increased activity tolerance but still requires more than a reasonable amount of time to complete tasks with multiple rest breaks.  Focus on activity tolerance, sit<>stand, standing balance, functional amb with RW for home mgmt tasks, and safety awareness to increased independence with BADLs.  Therapy Documentation Precautions:  Precautions Precautions: Fall Precaution Comments: Seizure precautions Restrictions Weight Bearing Restrictions: No   Pain:  Pt c/o discomfort in BLE with donning of Ted Hose; emotional support provided  Function:    Grooming Oral Care,Brush Teeth, Clean Dentures Activity:      Assist Level: Supervision or verbal cues      Wash, Rinse, Dry Face Activity   Assist Level: Supervision or verbal cues       Wash, Rinse, Dry Hands Activity   Assist Level: Supervision or verbal cues      Brush, Comb Hair Activity   Assist Level: Supervision or verbal cues    Shave Activity          Apply Makeup Activity                                                             Bathing Bathing position   Position: Wheelchair/chair at sink  Bathing parts Body parts bathed by patient: Right arm;Left arm;Chest;Abdomen;Front perineal area;Buttocks;Right upper leg;Left upper leg Body parts bathed by helper: Right lower leg;Left lower leg;Back  Bathing assist         Upper Body Dressing/Undressing Upper body dressing   What is the patient wearing?: Pull over shirt/dress     Pull over shirt/dress - Perfomed by patient: Thread/unthread right sleeve;Thread/unthread left sleeve;Put head through opening;Pull shirt over trunk          Upper body assist Assist Level: Supervision or verbal cues       Lower Body Dressing/Undressing Lower body dressing   What is the patient wearing?: Pants;Non-skid slipper socks;Underwear Underwear - Performed by patient: Pull underwear up/down Underwear - Performed by helper: Thread/unthread right underwear leg;Thread/unthread left underwear leg Pants- Performed by patient: Thread/unthread right pants leg;Thread/unthread  left pants leg   Non-skid slipper socks- Performed by patient: Don/doff right sock;Don/doff left sock                 TED Hose - Performed by helper: Don/doff right TED hose;Don/doff left TED hose  Lower body assist         Toileting Toileting   Toileting steps completed by patient: Adjust clothing prior to toileting;Performs perineal hygiene;Adjust clothing after toileting   Toileting Assistive Devices: Grab bar or rail  Toileting assist Assist level: Touching or steadying assistance (Pt.75%)    Bed Mobility Roll left and right activity   Assist level: Supervision or verbal cues  Sit to lying activity      Lying to sitting  activity   Assist level: Supervision or verbal cues  Mobility details Bed mobility details: Verbal cues for techniques   Transfers Sit to stand transfer   Sit to stand assist level: Supervision or verbal cues    Chair/bed transfer   Chair/bed transfer method: Ambulatory Chair/bed transfer assist level: Supervision or verbal cues Chair/bed transfer assistive device: Walker   Chair/bed transfer details: Visual cues/gestures for Actuary transfer assistive device: Grab bar       Assist level to toilet: Touching or steadying assistance (Pt > 75%) Assist level from toilet: Touching or steadying assistance (Pt > 75%)  Tub/shower transfer Tub/shower transfer activity did not occur: Refused           Cognition Comprehension Comprehension assist level: Understands basic 90% of the time/cues < 10% of the time  Expression Expression assist level: Expresses complex 90% of the time/cues < 10% of the time  Social Interaction Social Interaction assist level: Interacts appropriately with others with medication or extra time (anti-anxiety, antidepressant).  Problem Solving Problem solving assist level: Solves basic 75 - 89% of the time/requires cueing 10 - 24% of the time  Memory Memory assist level: Recognizes or recalls 75 - 89% of the time/requires cueing 10 - 24% of the time    Therapy/Group: Individual Therapy  Leroy Libman 04/10/2015, 10:42 AM

## 2015-04-10 NOTE — Progress Notes (Signed)
Subjective/Complaints: Right knee, ?low back pain. Slept fairly well.   ROS: Pt denies fever, rash/itching, headache, blurred or double vision, nausea, vomiting, abdominal pain, diarrhea, chest pain, shortness of breath, palpitations, dysuria, dizziness, neck or back pain, bleeding, anxiety, or depression   Objective: Vital Signs: Blood pressure 133/68, pulse 79, temperature 97.7 F (36.5 C), temperature source Oral, resp. rate 17, height 5\' 3"  (1.6 m), weight 71.668 kg (158 lb), SpO2 98 %. No results found. No results found for this or any previous visit (from the past 72 hour(s)).   HEENT: normal Cardio: RRR and no murmur Resp: CTA B/L and unlabored GI: BS positive and NT, ND Extremity:  No Edema Skin:   Intact Neuro: Flat, Abnormal Sensory parathesia to LT in RLE and 4 to 4+/5 Motor Musc/Skel:  Other Bilateral Hallux valgus deformities. Right knee tender to palpation. Low back with minimal tenderness at rest Gen NAD   Assessment/Plan: 1. Functional deficits secondary to  Left Temporal ICH due to hypertensive crisis which require 3+ hours per day of interdisciplinary therapy in a comprehensive inpatient rehab setting. Physiatrist is providing close team supervision and 24 hour management of active medical problems listed below. Physiatrist and rehab team continue to assess barriers to discharge/monitor patient progress toward functional and medical goals.  FIM: Function - Bathing Position: Wheelchair/chair at sink Body parts bathed by patient: Right arm, Left arm, Chest, Abdomen, Front perineal area, Buttocks, Right upper leg, Left upper leg Body parts bathed by helper: Right lower leg, Left lower leg, Back Assist Level: Touching or steadying assistance(Pt > 75%)  Function- Upper Body Dressing/Undressing What is the patient wearing?: Pull over shirt/dress Pull over shirt/dress - Perfomed by patient: Thread/unthread right sleeve, Thread/unthread left sleeve, Put head  through opening, Pull shirt over trunk Assist Level: Supervision or verbal cues Function - Lower Body Dressing/Undressing Lower body dressing/undressing activity did not occur: 2: Max-Patient completed 25-49% of tasks What is the patient wearing?: Pants, Non-skid slipper socks, Ted Hose, Underwear Position: Standing at sink Underwear - Performed by patient: Thread/unthread right underwear leg, Thread/unthread left underwear leg Underwear - Performed by helper: Pull underwear up/down Pants- Performed by patient: Thread/unthread right pants leg, Thread/unthread left pants leg, Fasten/unfasten pants Non-skid slipper socks- Performed by patient: Don/doff right sock, Don/doff left sock TED Hose - Performed by helper: Don/doff right TED hose, Don/doff left TED hose Assist Level: Touching or steadying assistance (Pt > 75%)  Function - Toileting Toileting steps completed by patient: Adjust clothing prior to toileting, Performs perineal hygiene, Adjust clothing after toileting Toileting steps completed by helper: Adjust clothing prior to toileting, Performs perineal hygiene Toileting Assistive Devices: Grab bar or rail Assist level: Touching or steadying assistance (Pt.75%)  Function - Air cabin crew transfer assistive device: Grab bar Assist level to toilet: Touching or steadying assistance (Pt > 75%) Assist level from toilet: Touching or steadying assistance (Pt > 75%)  Function - Chair/bed transfer Chair/bed transfer method: Ambulatory Chair/bed transfer assist level: Supervision or verbal cues Chair/bed transfer assistive device: Walker Chair/bed transfer details: Visual cues/gestures for precautions/safety  Function - Locomotion: Ambulation Assistive device: Walker-rolling Max distance: 150 Assist level: Supervision or verbal cues Assist level: Supervision or verbal cues Assist level: Supervision or verbal cues Assist level: Supervision or verbal cues  Function -  Comprehension Comprehension: Auditory Comprehension assist level: Understands basic 75 - 89% of the time/ requires cueing 10 - 24% of the time  Function - Expression Expression: Verbal Expression assist level: Expresses basic 90% of  the time/requires cueing < 10% of the time.  Function - Social Interaction Social Interaction assist level: Interacts appropriately 90% of the time - Needs monitoring or encouragement for participation or interaction.  Function - Problem Solving Problem solving assist level: Solves basic 50 - 74% of the time/requires cueing 25 - 49% of the time  Function - Memory Memory assist level: Recognizes or recalls 25 - 49% of the time/requires cueing 50 - 75% of the time  Medical Problem List and Plan: 1. Functional deficits secondary to right temporal intraparenchymal hemorrhage secondary to hypertensive crisis- encephalopathy related to [place], lethargy likely multifactorial including CVA effect  -more alert after Keppra D/Ced 8/11  -given age/CVA she is unlikely to rapidly return to baseline cognitve level as expected by family    2. DVT Prophylaxis/Anticoagulation: Subcutaneous heparin to continue 3. Pain Management: Voltaren gel 4 times a day to right knee, scheduled ice  -could consider injection if persistent--no nsaids due to bleeding risk  -not a good candidate for even tramadol given cognition/arousal 4. Seizure prophylaxis.    2nd EEG negative for seizure,discussed with Neuro--keppra stopped 5. Neuropsych: This patient is capable of making decisions on her own behalf. 6. Skin/Wound Care: Routine skin checks 7. Fluids/Electrolytes/Nutrition: Routine I&O, protein malnutrition  -eating quite well now 8. Hypertension. Cozaar 100 mg daily. Monitor with increased mobility, currently under control 9. Hypokalemia. resolved 10. New bradycardia/ afib:HR reasonable even with exertion  - No anticoag (except Subq hep for DVT prophyl) due to hemmorhagic CVA  LOS  (Days) 12 A FACE TO FACE EVALUATION WAS PERFORMED  Hagan Vanauken T 04/10/2015, 9:34 AM

## 2015-04-10 NOTE — Progress Notes (Signed)
Physical Therapy Session Note  Patient Details  Name: [name] MRN: [id number] Date of Birth: [birth year]  Today's Date: 04/10/2015 PT Individual Time: 1000-1100 PT Individual Time Calculation (min): 60 min   Short Term Goals: Week 1:  PT Short Term Goal 1 (Week 1): Pt will perform bed mobility with Mod A PT Short Term Goal 1 - Progress (Week 1): Met PT Short Term Goal 2 (Week 1): Pt will perform transfers with Mod A PT Short Term Goal 2 - Progress (Week 1): Met PT Short Term Goal 3 (Week 1): Pt will ambulate 45' with LRAD and Max A PT Short Term Goal 3 - Progress (Week 1): Met PT Short Term Goal 4 (Week 1): Pt will perform w/c propulsion 15' with Max A PT Short Term Goal 4 - Progress (Week 1): Met  Skilled Therapeutic Interventions/Progress Updates:    Therapeutic Activity: PT instructed patient in dynamic balance activity assisting with doffing socks and donning shoes. PT instructed patient in sit<>stand transfers from a variety of surfaces with RW and supervision. Pt demos pushing up with hands from sitting surface without cues. PT instructed patient in car transfer with min A to rise from car. PT instructed patient in ankle pumps x15 with noted increase in pain in R knee, pt reports 10/10.  Gait Training: PT instructed patient in ambulation x150' with RW and supervision. Pt demonstrates forward flex posture and short, shuffling steps. Verbal cues for longer step length and upright posture.  PT instructed patient in negotiation of 12 steps with 2 hand rails and supervision/steady A.  Pt returned to room at end of session and positioned in w/c with QRB in place, call bell in reach, and needs met.   Therapy Documentation Precautions:  Precautions Precautions: Fall Precaution Comments: Seizure precautions Restrictions Weight Bearing Restrictions: No  Pain: Pain Assessment Pain Assessment: 0-10 Pain Score: 10-Worst pain ever Pain Location: Knee Pain Orientation: Right Pain  Descriptors / Indicators: Aching Pain Onset: Progressive (progressively worse with ambulation and stairs) Pain Intervention(s): RN made aware;Emotional support   Function:  Herbalist Devices: Grab bar or rail   Bed Mobility Roll left and right activity   Assist level: Supervision or verbal cues  Sit to lying activity   Assist level: No help, No cues, assistive device, takes more than a reasonable amount of time  Lying to sitting activity   Assist level: Supervision or verbal cues  Mobility details Bed mobility details: Verbal cues for techniques   Transfers Sit to stand transfer   Sit to stand assist level: Supervision or verbal cues Sit to stand assistive device: Armrests;Walker  Chair/bed transfer   Chair/bed transfer method: Ambulatory Chair/bed transfer assist level: Supervision or verbal cues Chair/bed transfer assistive device: Walker   Chair/bed transfer details: Designer, fashion/clothing for Careers information officer transfer assistive device: Therapist, music transfer assistive device: Publishing rights manager transfer assist level: Touching or steadying assistance (Pt > 75%, lift 1 leg)    Locomotion Ambulation   Assistive device: Walker-rolling Max distance: 150 Assist level: Supervision or verbal cues  Walk 10 feet activity   Assist level: Supervision or verbal cues  Walk 50 feet with 2 turns activity   Assist level: Supervision or verbal cues  Walk 150 feet activity   Assist level: Supervision or verbal cues  Walk 10 feet on uneven surfaces activity      Stairs   Stairs  assistive device: 2 hand rails Max number of stairs: 12 Stairs assist level: Touching or steadying assistance (Pt > 75%)  Walk up/down 1 step activity     Walk up/down 1 step (curb) assist level: Touching or steadying assistance (Pt > 75%)  Walk up/down 4 steps activity   Walk up/down 4 steps assist level: Touching or steadying  assistance (Pt > 75%)  Walk up/down 12 steps activity   Walk up/down 12 steps assist level: Touching or steadying assistance (Pt > 75%)  Pick up small objects from floor      Wheelchair   Type: Manual Max wheelchair distance: 150 Assist Level: Supervision or verbal cues  Wheel 50 feet with 2 turns activity   Assist Level: Supervision or verbal cues  Wheel 150 feet activity   Assist Level: Supervision or verbal cues   Cognition Comprehension Comprehension assist level: Understands basic 90% of the time/cues < 10% of the time  Expression Expression assist level: Expresses complex 90% of the time/cues < 10% of the time  Social Interaction Social Interaction assist level: Interacts appropriately with others with medication or extra time (anti-anxiety, antidepressant).  Problem Solving Problem solving assist level: Solves basic 75 - 89% of the time/requires cueing 10 - 24% of the time  Memory Memory assist level: Recognizes or recalls 75 - 89% of the time/requires cueing 10 - 24% of the time    Therapy/Group: Individual Therapy  Earnest Conroy Penven-Crew 04/10/2015, 12:29 PM

## 2015-04-10 NOTE — Progress Notes (Signed)
Social Work Patient ID: [name], female   DOB: 01/20/1918, 81 y.o.   MRN: [id number]   Have reviewed team conference with pt and daughter (via phone).  Aware and agreeable with new targeted d/c date of 8/24 and goals of supervision/ min assist.  Daughter has private duty caregiver already in the home for her father and says she will coordinate any needed care for pt as well.  Will continue to follow.  Akiba Melfi, LCSW

## 2015-04-10 NOTE — Progress Notes (Signed)
Occupational Therapy Weekly Progress Note  Patient Details  Name: [name] MRN: [id number] Date of Birth: 1918/06/17  Beginning of progress report period: March 30, 2015 End of progress report period: April 10, 2015  Patient has met 4 of 4 short term goals.  Pt continues to make steady progress with BADLs since admission.  Pt continues to require more than a reasonable amount of time to complete all tasks with multiple rest breaks.  Pt continues to voice concerns about her fatigue but is encouraged that her endurance is improving.  Pt exhibits increased dynamic standing balance to complete toileting tasks and LB dressing tasks.  Patient continues to demonstrate the following deficits: decreased strength, decreased endurance, decreased balance, decreased I in self care, decreased functional transfers, and cognitive deficits and therefore will continue to benefit from skilled OT intervention to enhance overall performance with BADL.  Patient progressing toward long term goals.Marland Kitchen  goals for bathing, LB dressing, and toileting upgraded to supervision  OT Short Term Goals Week 1:  OT Short Term Goal 1 (Week 1): Pt will perform sit <>stand with mod A during self care. OT Short Term Goal 1 - Progress (Week 1): Met OT Short Term Goal 2 (Week 1): Pt will perform shower transfer with mod A in order to increase I in functional transfers. OT Short Term Goal 2 - Progress (Week 1): Met OT Short Term Goal 3 (Week 1): Pt will perform LB dressing with max A in order to decrease level of assist with self care.  OT Short Term Goal 3 - Progress (Week 1): Met OT Short Term Goal 4 (Week 1): Pt will perform UB dressing with min A in order to increase level of assist with self care.  OT Short Term Goal 4 - Progress (Week 1): Met Week 2:  OT Short Term Goal 1 (Week 2): STG=LTGs secondary to ELOS     Therapy Documentation Precautions:  Precautions Precautions: Fall Precaution Comments: Seizure  precautions Restrictions Weight Bearing Restrictions: No      Leroy Libman 04/10/2015, 12:04 PM

## 2015-04-10 NOTE — Progress Notes (Signed)
Speech Language Pathology Daily Session Note  Patient Details  Name: [name] MRN: [id number] Date of Birth: [birth year]  Today's Date: 04/10/2015 SLP Individual Time: 1400-1500 SLP Individual Time Calculation (min): 60 min  Short Term Goals: Week 2: SLP Short Term Goal 1 (Week 2): Patient will demonstrate efficient mastication of Dys. 3 textures without overt s/s of aspiration over 2 consecutive sessions with Min A verbal cues.  SLP Short Term Goal 2 (Week 2): Patient will consume current diet with minimal overt s/s of aspiration with supervision verbal cues for use of swallowing compensatory strategies.  SLP Short Term Goal 3 (Week 2): Patient will demonstrate functional problem solving for basic and familiar tasks with Min A verbal cues.  SLP Short Term Goal 4 (Week 2): Patient will utilize call bell to request assistance in 50% of observable opportunities with Mod A question cues.  SLP Short Term Goal 5 (Week 2): Patient will sustain attention to a functional task for 10 minutes with Min A verbal cues.  SLP Short Term Goal 6 (Week 2): Patient will utilize external memory aids to increase orientation with supervision verbal and question cues.   Skilled Therapeutic Interventions: Skilled treatment session focused on cognitive and dysphagia goals. SLP facilitated session by providing extra time and Min A verbal cues for basic problem solving with a basic money management task and recalled the functions of her current medications with Max A multimodal cues.  Patient participated in a functional conversation with focus on anticipatory awareness in regards to d/c planning. Patient demonstrated increased awareness of deficits and reported she knows she will need someone with her because her memory is "not right" and will not be able to drive.  SLP also facilitated session by providing trials of Dys. 3 textures. Patient demonstrated efficient mastication without overt s/s of aspiration, therefore,  recommend trial tray prior to upgrade.  Patient requested to use the bathroom and required Max A verbal cues for problem solving and sequencing with task. Patient left upright in wheelchair with quick release belt in place and all needs within reach. Continue with current plan of care.    Function:  Eating Eating   Modified Consistency Diet: Yes Eating Assist Level: More than reasonable amount of time;Supervision or verbal cues;Set up assist for   Eating Set Up Assist For: Opening containers       Cognition Comprehension Comprehension assist level: Understands basic 90% of the time/cues < 10% of the time  Expression   Expression assist level: Expresses basic 90% of the time/requires cueing < 10% of the time.  Social Interaction Social Interaction assist level: Interacts appropriately with others with medication or extra time (anti-anxiety, antidepressant).  Problem Solving Problem solving assist level: Solves basic 50 - 74% of the time/requires cueing 25 - 49% of the time  Memory Memory assist level: Recognizes or recalls less than 25% of the time/requires cueing greater than 75% of the time    Pain Pain Assessment Pain Assessment: No/denies pain  Therapy/Group: Individual Therapy  [name] 04/10/2015, 4:04 PM

## 2015-04-11 ENCOUNTER — Inpatient Hospital Stay (HOSPITAL_COMMUNITY): Payer: Self-pay | Admitting: Physical Therapy

## 2015-04-11 ENCOUNTER — Inpatient Hospital Stay (HOSPITAL_COMMUNITY): Payer: Medicare Other | Admitting: Speech Pathology

## 2015-04-11 ENCOUNTER — Inpatient Hospital Stay (HOSPITAL_COMMUNITY): Payer: Medicare Other | Admitting: Occupational Therapy

## 2015-04-11 DIAGNOSIS — I611 Nontraumatic intracerebral hemorrhage in hemisphere, cortical: Secondary | ICD-10-CM

## 2015-04-11 NOTE — Plan of Care (Signed)
Problem: RH PAIN MANAGEMENT Goal: RH STG PAIN MANAGED AT OR BELOW PT'S PAIN GOAL <3  Outcome: Progressing No c/o pain. Utilizes Voltaren gel to R knee

## 2015-04-11 NOTE — Progress Notes (Signed)
Subjective/Complaints: Feels that her strength is getting a little better. Typically very tired by end of the day but it helps her sleep!  Right knee still an issue. Told me that she had an injection just prior to this admission. Asked about tramadol   ROS: Pt denies fever, rash/itching, headache, blurred or double vision, nausea, vomiting, abdominal pain, diarrhea, chest pain, shortness of breath, palpitations, dysuria, dizziness, neck or back pain, bleeding, anxiety, or depression   Objective: Vital Signs: Blood pressure 120/98, pulse 75, temperature 98.1 F (36.7 C), temperature source Oral, resp. rate 16, height 5\' 3"  (1.6 m), weight 49.85 kg (109 lb 14.4 oz), SpO2 99 %. No results found. No results found for this or any previous visit (from the past 72 hour(s)).   HEENT: normal Cardio: RRR and no murmur Resp: CTA B/L and unlabored GI: BS positive and NT, ND Extremity:  No Edema Skin:   Intact Neuro: Flat, Abnormal Sensory parathesia to LT in RLE and 4 to 4+/5 Motor Musc/Skel:  Other Bilateral Hallux valgus deformities. Right knee tender to palpation with no obvious effusion, mild crepitus. Low back with minimal tenderness at rest Gen NAD   Assessment/Plan: 1. Functional deficits secondary to  Left Temporal ICH due to hypertensive crisis which require 3+ hours per day of interdisciplinary therapy in a comprehensive inpatient rehab setting. Physiatrist is providing close team supervision and 24 hour management of active medical problems listed below. Physiatrist and rehab team continue to assess barriers to discharge/monitor patient progress toward functional and medical goals.  FIM: Function - Bathing Position: Wheelchair/chair at sink Body parts bathed by patient: Right arm, Left arm, Chest, Abdomen, Front perineal area, Buttocks, Right upper leg, Left upper leg Body parts bathed by helper: Right lower leg, Left lower leg, Back Assist Level: Touching or steadying  assistance(Pt > 75%)  Function- Upper Body Dressing/Undressing What is the patient wearing?: Pull over shirt/dress Pull over shirt/dress - Perfomed by patient: Thread/unthread right sleeve, Thread/unthread left sleeve, Put head through opening, Pull shirt over trunk Assist Level: Supervision or verbal cues Function - Lower Body Dressing/Undressing Lower body dressing/undressing activity did not occur: 2: Max-Patient completed 25-49% of tasks What is the patient wearing?: Pants, Non-skid slipper socks, Underwear Position: Standing at sink Underwear - Performed by patient: Pull underwear up/down Underwear - Performed by helper: Thread/unthread right underwear leg, Thread/unthread left underwear leg Pants- Performed by patient: Thread/unthread right pants leg, Thread/unthread left pants leg Non-skid slipper socks- Performed by patient: Don/doff right sock, Don/doff left sock TED Hose - Performed by helper: Don/doff right TED hose, Don/doff left TED hose Assist Level: Touching or steadying assistance (Pt > 75%)  Function - Toileting Toileting steps completed by patient: Adjust clothing prior to toileting, Performs perineal hygiene, Adjust clothing after toileting Toileting steps completed by helper: Adjust clothing prior to toileting, Performs perineal hygiene Toileting Assistive Devices: Grab bar or rail Assist level: Touching or steadying assistance (Pt.75%)  Function - Air cabin crew transfer assistive device: Grab bar Assist level to toilet: Touching or steadying assistance (Pt > 75%) Assist level from toilet: Touching or steadying assistance (Pt > 75%)  Function - Chair/bed transfer Chair/bed transfer method: Ambulatory Chair/bed transfer assist level: Supervision or verbal cues Chair/bed transfer assistive device: Walker Chair/bed transfer details: Visual cues/gestures for precautions/safety  Function - Locomotion: Wheelchair Will patient use wheelchair at discharge?:  Yes Type: Manual Max wheelchair distance: 150 Assist Level: Supervision or verbal cues Assist Level: Supervision or verbal cues Assist Level: Supervision or  verbal cues Function - Locomotion: Ambulation Max distance: 150 Assist level: Supervision or verbal cues Assist level: Supervision or verbal cues Assist level: Supervision or verbal cues Assist level: Supervision or verbal cues  Function - Comprehension Comprehension: Auditory Comprehension assist level: Understands basic 75 - 89% of the time/ requires cueing 10 - 24% of the time  Function - Expression Expression: Verbal Expression assist level: Expresses basic 90% of the time/requires cueing < 10% of the time.  Function - Social Interaction Social Interaction assist level: Interacts appropriately with others with medication or extra time (anti-anxiety, antidepressant).  Function - Problem Solving Problem solving assist level: Solves basic 50 - 74% of the time/requires cueing 25 - 49% of the time  Function - Memory Memory assist level: Recognizes or recalls 25 - 49% of the time/requires cueing 50 - 75% of the time Patient normally able to recall (first 3 days only): That he or she is in a hospital, Staff names and faces, Current season  Medical Problem List and Plan: 1. Functional deficits secondary to right temporal intraparenchymal hemorrhage secondary to hypertensive crisis- encephalopathy related to [place], lethargy likely multifactorial including CVA effect  -more alert after Keppra D/Ced 8/11  -given age/CVA she is unlikely to rapidly return to baseline cognitve level as expected by family    2. DVT Prophylaxis/Anticoagulation: Subcutaneous heparin to continue 3. Pain Management: Voltaren gel 4 times a day to right knee, scheduled ice  -it sounds as if she's had a recent knee injection within the last 4-6 weeks  -will order for a neoprene knee sleeve for support  -not a good candidate for even tramadol given  cognition/arousal 4. Seizure prophylaxis.    2nd EEG negative for seizure,discussed with Neuro--keppra stopped 5. Neuropsych: This patient is capable of making decisions on her own behalf. 6. Skin/Wound Care: Routine skin checks 7. Fluids/Electrolytes/Nutrition: Routine I&O, protein malnutrition  -eating quite well now 8. Hypertension. Cozaar 100 mg daily. Monitor with increased mobility, currently under control 9. Hypokalemia. resolved 10. New bradycardia/ afib:HR reasonable even with exertion  - No anticoag (except Subq hep for DVT prophyl) due to hemmorhagic CVA  LOS (Days) 13 A FACE TO FACE EVALUATION WAS PERFORMED  Jerrie Gullo T 04/11/2015, 7:42 AM

## 2015-04-11 NOTE — Progress Notes (Signed)
Occupational Therapy Session Note  Patient Details  Name: [name] MRN: [id number] Date of Birth: 1918/02/23  Today's Date: 04/11/2015 OT Individual Time: 5277-8242 OT Individual Time Calculation (min): 84 min    Short Term Goals: Week 2:  OT Short Term Goal 1 (Week 2): STG=LTGs secondary to ELOS  Skilled Therapeutic Interventions/Progress Updates:    Upon entering the room, pt supine in bed with no c/o pain. Pt eager and motivated for shower this session. Pt ambulating 10' to dresser with use of RW and steady assist to obtain clothing items with steady assist for balance. Pt then ambulating into bathroom to sit onto TTB for bathing. Bathing at shower level with min verbal cues for safety and initiation this session. Pt dressing from wheelchair at sink side with sit <>stands requiring min A. Pt requiring min verbal cues for problem solving and safety with LB clothing management. Pt performing grooming while seated in wheelchair. Quick release belt donned and call bell and all other needed items within reach upon exiting the room. Please see below for further details.   Therapy Documentation Precautions:  Precautions Precautions: Fall Precaution Comments: Seizure precautions Restrictions Weight Bearing Restrictions: No Vital Signs: Therapy Vitals Temp: 98.1 F (36.7 C) Temp Source: Oral Pulse Rate: 75 BP: (!) 120/98 mmHg Patient Position (if appropriate): Sitting Oxygen Therapy SpO2: 99 % O2 Device: Not Delivered  Function:   Eating Eating Eating activity did not occur: N/A             Grooming Oral Care,Brush Teeth, Clean Dentures Activity:             Wash, Rinse, Dry Face Activity   Assist Level: Supervision or verbal cues      Wash, Rinse, Dry Hands Activity   Assist Level: Supervision or verbal cues      Brush, Comb Hair Activity   Assist Level: Supervision or verbal cues    Shave Activity Shave activity did not occur: Patient does not shave        Apply Makeup Activity Apply makeup activity did not occur: N/A                                                           Bathing Bathing position   Position: Shower  Bathing parts Body parts bathed by patient: Right arm;Left arm;Chest;Abdomen;Front perineal area;Buttocks;Right upper leg;Left upper leg Body parts bathed by helper: Right lower leg;Left lower leg;Back  Bathing assist Assist Level: Touching or steadying assistance(Pt > 75%)       Upper Body Dressing/Undressing Upper body dressing   What is the patient wearing?: Pull over shirt/dress;Bra Bra - Perfomed by patient: Thread/unthread right bra strap;Thread/unthread left bra strap;Hook/unhook bra (pull down sports bra)   Pull over shirt/dress - Perfomed by patient: Thread/unthread right sleeve;Thread/unthread left sleeve;Put head through opening;Pull shirt over trunk          Upper body assist Assist Level: Set up       Lower Body Dressing/Undressing Lower body dressing   What is the patient wearing?: Pants;Underwear;Ted Hose Underwear - Performed by patient: Pull underwear up/down;Thread/unthread right underwear leg Underwear - Performed by helper: Thread/unthread left underwear leg Pants- Performed by patient: Thread/unthread right pants leg;Thread/unthread left pants leg;Pull pants up/down  TED Hose - Performed by helper: Don/doff right TED hose;Don/doff left TED hose  Lower body assist Assist Level: Touching or steadying assistance (Pt > 75%)       Toileting Toileting          Toileting assist      Bed Mobility Roll left and right activity   Assist level: Supervision or verbal cues  Sit to lying activity   Assist level: No help, No cues, assistive device, takes more than a reasonable amount of time  Lying to sitting activity   Assist level: Supervision or verbal cues  Mobility details Bed mobility details: Verbal cues for techniques   Transfers Sit to stand  transfer   Sit to stand assist level: Supervision or verbal cues Sit to stand assistive device: Walker;Armrests  Chair/bed transfer   Chair/bed transfer method: Ambulatory Chair/bed transfer assist level: Supervision or verbal cues Chair/bed transfer assistive device: Walker   Chair/bed transfer details: Visual cues/gestures for Radio broadcast assistant transfer             Cognition Comprehension Comprehension assist level: Understands basic 75 - 89% of the time/ requires cueing 10 - 24% of the time  Expression Expression assist level: Expresses basic 90% of the time/requires cueing < 10% of the time.  Social Interaction Social Interaction assist level: Interacts appropriately with others with medication or extra time (anti-anxiety, antidepressant).  Problem Solving Problem solving assist level: Solves basic 50 - 74% of the time/requires cueing 25 - 49% of the time  Memory Memory assist level: Recognizes or recalls 25 - 49% of the time/requires cueing 50 - 75% of the time    Therapy/Group: Individual Therapy  Phineas Semen 04/11/2015, 8:30 AM

## 2015-04-11 NOTE — Progress Notes (Signed)
Speech Language Pathology Daily Session Note  Patient Details  Name: [name] MRN: [id number] Date of Birth: August 08, 1918  Today's Date: 04/11/2015 SLP Individual Time: 1100-1200 SLP Individual Time Calculation (min): 60 min  Short Term Goals: Week 2: SLP Short Term Goal 1 (Week 2): Patient will demonstrate efficient mastication of Dys. 3 textures without overt s/s of aspiration over 2 consecutive sessions with Min A verbal cues.  SLP Short Term Goal 2 (Week 2): Patient will consume current diet with minimal overt s/s of aspiration with supervision verbal cues for use of swallowing compensatory strategies.  SLP Short Term Goal 3 (Week 2): Patient will demonstrate functional problem solving for basic and familiar tasks with Min A verbal cues.  SLP Short Term Goal 4 (Week 2): Patient will utilize call bell to request assistance in 50% of observable opportunities with Mod A question cues.  SLP Short Term Goal 5 (Week 2): Patient will sustain attention to a functional task for 10 minutes with Min A verbal cues.  SLP Short Term Goal 6 (Week 2): Patient will utilize external memory aids to increase orientation with supervision verbal and question cues.   Skilled Therapeutic Interventions: Skilled treatment session focused on cognitive and dysphagia goals. SLP facilitated session by providing extra time and Mod A verbal and visual cues for basic problem solving with a basic money management task.  Patient recalled events from morning therapy sessions with Min A question cues and demonstrated selective attention to tasks in a mildly distracting environment for 30 minutes with Mod A verbal cues for redirection due to verbosity. Patient also consumed lunch meal of Dys. 3 textures with thin liquids without overt s/s of aspiration and was Mod I for use of swallowing compensatory strategies. Recommend patient upgrade to Dys. 3 textures and continue full supervision. Patient left upright in wheelchair with  quick release belt in place and all needs within reach. Continue with current plan of care.   Function:  Eating Eating Eating activity did not occur: N/A Modified Consistency Diet: Yes Eating Assist Level: Supervision or verbal cues;More than reasonable amount of time;Set up assist for   Eating Set Up Assist For: Opening containers       Cognition Comprehension Comprehension assist level: Understands basic 75 - 89% of the time/ requires cueing 10 - 24% of the time  Expression   Expression assist level: Expresses basic 90% of the time/requires cueing < 10% of the time.  Social Interaction Social Interaction assist level: Interacts appropriately 90% of the time - Needs monitoring or encouragement for participation or interaction.  Problem Solving Problem solving assist level: Solves basic 50 - 74% of the time/requires cueing 25 - 49% of the time  Memory Memory assist level: Recognizes or recalls 25 - 49% of the time/requires cueing 50 - 75% of the time    Pain Pain Assessment Pain Assessment: No/denies pain  Therapy/Group: Individual Therapy  [name] 04/11/2015, 11:37 AM

## 2015-04-11 NOTE — Progress Notes (Signed)
Physical Therapy Session Note  Patient Details  Name: RAINA SOLE MRN: [id number] Date of Birth: 09/01/1917  Today's Date: 04/11/2015 PT Individual Time: 1300-1400 PT Individual Time Calculation (min): 60 min   Short Term Goals: Week 2:  PT Short Term Goal 1 (Week 2): Pt will demonstrate supine<>sit and scooting in bed with supervision.  PT Short Term Goal 2 (Week 2): Pt will demonstrate sit<>stand from a variety of surfaces with mod I PT Short Term Goal 3 (Week 2): Pt will ambulate 120 feet with RW and min A PT Short Term Goal 4 (Week 2): Pt will propel w/c 150' with supervision  Skilled Therapeutic Interventions/Progress Updates:    W/C Management: PT instructed patient in controlled environment and community level w/c propulsion using BUEs. Pt able to manage brakes with verbal cues. Pt propelled w/c 150' x2 to/from therapy gym and x200' community level across uneven surfaces, through doorways, around obstacles, and over thresholds. Focus on propulsion technique, turning, and memory of route to atrium and back to room.   Therapeutic Exercise: PT instructed patient in BLE exercise for strengthening and endurance. Pt performed 2x10 heel/toe raises, LAQ, seated marching, and hip add/abd.    Pt returned to room at end of session and positioned in w/c with call bell in reach and needs met.   Therapy Documentation Precautions: Precautions Precautions: Fall Precaution Comments: Seizure precautions Restrictions Weight Bearing Restrictions: No General:   Vital Signs: Therapy Vitals Temp: 98.3 F (36.8 C) Temp Source: Oral Pulse Rate: 90 Resp: 18 BP: (!) 122/48 mmHg Patient Position (if appropriate): Sitting Oxygen Therapy SpO2: 96 % O2 Device: Not Delivered Pain: Pain Assessment Pain Assessment: 0-10 Pain Score: 6  Pain Location: Knee Pain Orientation: Right Pain Descriptors / Indicators: Aching Pain Intervention(s): Cold applied;Emotional  support;Repositioned   Function:  Locomotion Ambulation          Walk 10 feet activity      Walk 50 feet with 2 turns activity      Walk 150 feet activity      Walk 10 feet on uneven surfaces activity      Stairs          Walk up/down 1 step activity        Walk up/down 4 steps activity      Walk up/down 12 steps activity      Pick up small objects from floor      Wheelchair   Type: Manual Max wheelchair distance: 200 Assist Level: Supervision or verbal cues  Wheel 50 feet with 2 turns activity      Wheel 150 feet activity   Assist Level: Supervision or verbal cues   Cognition Comprehension    Expression Expression assist level: Expresses basic 90% of the time/requires cueing < 10% of the time.  Social Interaction Social Interaction assist level: Interacts appropriately 90% of the time - Needs monitoring or encouragement for participation or interaction.  Problem Solving Problem solving assist level: Solves basic 50 - 74% of the time/requires cueing 25 - 49% of the time  Memory Memory assist level: Recognizes or recalls 25 - 49% of the time/requires cueing 50 - 75% of the time    Therapy/Group: Individual Therapy  Earnest Conroy Penven-Crew 04/11/2015, 4:09 PM

## 2015-04-12 ENCOUNTER — Inpatient Hospital Stay (HOSPITAL_COMMUNITY): Payer: Medicare Other | Admitting: Speech Pathology

## 2015-04-12 ENCOUNTER — Inpatient Hospital Stay (HOSPITAL_COMMUNITY): Payer: Self-pay | Admitting: Physical Therapy

## 2015-04-12 ENCOUNTER — Inpatient Hospital Stay (HOSPITAL_COMMUNITY): Payer: Medicare Other | Admitting: Occupational Therapy

## 2015-04-12 DIAGNOSIS — I482 Chronic atrial fibrillation: Secondary | ICD-10-CM

## 2015-04-12 NOTE — Progress Notes (Signed)
Occupational Therapy Session Note  Patient Details  Name: [name] MRN: [id number] Date of Birth: [birth year]  Today's Date: 04/12/2015 OT Individual Time: 6720-9470 OT Individual Time Calculation (min): 75 min    Short Term Goals: Week 2:  OT Short Term Goal 1 (Week 2): STG=LTGs secondary to ELOS  Skilled Therapeutic Interventions/Progress Updates:  Upon entering the room, pt seated on toilet with NT present in room. Pt with no c/o pain this session. Pt having successful BM this session with steady assist for balance during hygiene and required assistance for clothing management. Pt ambulating with use of RW and min A for balance to sit on EOB to finish breakfast tray with full supervision. Pt requiring min verbal cues for small bites/sips and to not talk while trying to eat and chew food items. Pt seated on EOB for 20 min while finishing food with supervision for balance. Pt declined bathing this session. Dressing performed with sit <>stand from wheelchair at sink side. See below for details. Pt reports extreme exhaustion and requesting to finish session secondary to next therapist scheduled to arrive directly after this session. Quick release belt donned and call bell within reach upon exiting the room.    Therapy Documentation Precautions:  Precautions Precautions: Fall Precaution Comments: Seizure precautions Restrictions Weight Bearing Restrictions: No General:   Vital Signs:   Pain:   ADL:   Exercises:   Other Treatments:    Function:   Eating Eating   Eating Assist Level: Supervision or verbal cues   Eating Set Up Assist For: Opening containers       Grooming Oral Care,Brush Teeth, Clean Dentures Activity:      Assist Level: Touching or steadying assistance(Pt > 75%)   Set up : To apply toothpaste  Wash, Rinse, Dry Face Activity   Assist Level: Touching or steadying assistance(Pt > 75%)      Wash, Rinse, Dry Hands Activity   Assist Level: Touching  or steadying assistance(Pt > 75%)      Brush, Comb Hair Activity   Assist Level: Touching or steadying assistance(Pt > 75%)    Shave Activity          Apply Makeup Activity Apply makeup activity did not occur: N/A                                                           Bathing Bathing position      Bathing parts      Bathing assist         Upper Body Dressing/Undressing Upper body dressing   What is the patient wearing?: Pull over shirt/dress     Pull over shirt/dress - Perfomed by patient: Thread/unthread right sleeve;Thread/unthread left sleeve;Put head through opening;Pull shirt over trunk          Upper body assist Assist Level: Supervision or verbal cues       Lower Body Dressing/Undressing Lower body dressing   What is the patient wearing?: Ted Hose;Underwear;Pants Underwear - Performed by patient: Pull underwear up/down;Thread/unthread right underwear leg;Thread/unthread left underwear leg   Pants- Performed by patient: Thread/unthread right pants leg;Thread/unthread left pants leg;Pull pants up/down;Fasten/unfasten pants                     TED Hose - Performed by helper: Don/doff  right TED hose;Don/doff left TED hose  Lower body assist Assist Level: Touching or steadying assistance (Pt > 75%)       Toileting Toileting   Toileting steps completed by patient: Adjust clothing prior to toileting;Performs perineal hygiene Toileting steps completed by helper: Adjust clothing after toileting Toileting Assistive Devices: Grab bar or rail  Toileting assist      Bed Mobility Roll left and right activity      Sit to lying activity      Lying to sitting activity      Mobility details     Transfers Sit to stand transfer        Chair/bed transfer              Toilet transfer   Toilet transfer assistive device: Grab bar;Elevated toilet seat/BSC over toilet       Assist level to toilet: Touching or steadying assistance (Pt >  75%) Assist level from toilet: Touching or steadying assistance (Pt > 75%)  Tub/shower transfer             Cognition Comprehension Comprehension assist level: Understands basic 75 - 89% of the time/ requires cueing 10 - 24% of the time  Expression Expression assist level: Expresses basic 90% of the time/requires cueing < 10% of the time.  Social Interaction Social Interaction assist level: Interacts appropriately 90% of the time - Needs monitoring or encouragement for participation or interaction.  Problem Solving Problem solving assist level: Solves basic 50 - 74% of the time/requires cueing 25 - 49% of the time  Memory Memory assist level: Recognizes or recalls 25 - 49% of the time/requires cueing 50 - 75% of the time    Therapy/Group: Individual Therapy  Phineas Semen 04/12/2015, 9:56 AM

## 2015-04-12 NOTE — Progress Notes (Signed)
Speech Language Pathology Weekly Progress and Session Note  Patient Details  Name: [name] MRN: [id number] Date of Birth: [birth year]  Beginning of progress report period: April 05, 2015 End of progress report period: April 12, 2015  Today's Date: 04/12/2015 SLP Individual Time: 1315-1400 SLP Individual Time Calculation (min): 45 min  Short Term Goals: Week 2: SLP Short Term Goal 1 (Week 2): Patient will demonstrate efficient mastication of Dys. 3 textures without overt s/s of aspiration over 2 consecutive sessions with Min A verbal cues.  SLP Short Term Goal 1 - Progress (Week 2): Met SLP Short Term Goal 2 (Week 2): Patient will consume current diet with minimal overt s/s of aspiration with supervision verbal cues for use of swallowing compensatory strategies.  SLP Short Term Goal 2 - Progress (Week 2): Met SLP Short Term Goal 3 (Week 2): Patient will demonstrate functional problem solving for basic and familiar tasks with Min A verbal cues.  SLP Short Term Goal 3 - Progress (Week 2): Not met SLP Short Term Goal 4 (Week 2): Patient will utilize call bell to request assistance in 50% of observable opportunities with Mod A question cues.  SLP Short Term Goal 4 - Progress (Week 2): Met SLP Short Term Goal 5 (Week 2): Patient will sustain attention to a functional task for 10 minutes with Min A verbal cues.  SLP Short Term Goal 5 - Progress (Week 2): Not met SLP Short Term Goal 6 (Week 2): Patient will utilize external memory aids to increase orientation with supervision verbal and question cues.  SLP Short Term Goal 6 - Progress (Week 2): Not met    New Short Term Goals: Week 3: SLP Short Term Goal 1 (Week 3): Patient will demonstrate efficient mastication of regular textureswithout overt s/s of aspiration over 2 consecutive sessions with Min A verbal cues.  SLP Short Term Goal 2 (Week 3): Patient will consume current diet with minimal overt s/s of aspiration with Mod I for use of  swallowing compensatory strategies.  SLP Short Term Goal 3 (Week 3): Patient will demonstrate functional problem solving for basic and familiar tasks with Min A verbal cues.  SLP Short Term Goal 4 (Week 3): Patient will sustain attention to a functional task for 10 minutes with Min A verbal cues.  SLP Short Term Goal 5 (Week 3): Patient will utilize external memory aids to increase orientation with supervision verbal and question cues.   Weekly Progress Updates: Patient has made functional gains and has met 3 of 6 STG's this reporting period due to increased swallowing function and use of the call bell. Currently, patient is consuming Dys. 3 textures with thin liquids with without overt s/s of aspiration and requires supervision verbal cues for use of swallowing compensatory strategies. Patient also requires overall Mod-Max A multimodal cues for recall of new and functional information, use of call bell to request assistance and functional problem solving with basic and familiar tasks and Min A verbal cues for orientation and sustained attention for ~5 minutes.  Patient education is ongoing and patient would benefit from continued skilled SLP intervention to maximize cognitive and swallowing function and overall functional independence prior to discharge.   Intensity: Minumum of 1-2 x/day, 30 to 90 minutes Frequency: 3 to 5 out of 7 days Duration/Length of Stay: 04/17/15 Treatment/Interventions: Cognitive remediation/compensation;Cueing hierarchy;Environmental controls;Functional tasks;Internal/external aids;Patient/family education;Therapeutic Activities;Dysphagia/aspiration precaution training   Daily Session  Skilled Therapeutic Interventions: Skilled treatment session focused on cognitive goals. Upon arrival, patient was sitting  upright in chair but was disoriented to time of day. SLP facilitated session by providing visual cues in regards to the patient's daily schedule to maximize orientation  and recall of events from morning therapy sessions. Patient was Mod I for orientation to place, time, situation and room number and required Min A verbal cues to recall 1 activity from each therapy session. However, patient required total A for orientation to time of day throughout the session despite visual, verbal and environmental cues. Patient left upright in wheelchair with all needs within reach and quick relaese belt in place. Continue with current plan of care.    Function:   Cognition Comprehension Comprehension assist level: Understands basic 50 - 74% of the time/ requires cueing 25 - 49% of the time  Expression   Expression assist level: Expresses basic 90% of the time/requires cueing < 10% of the time.  Social Interaction Social Interaction assist level: Interacts appropriately 90% of the time - Needs monitoring or encouragement for participation or interaction.  Problem Solving Problem solving assist level: Solves basic 25 - 49% of the time - needs direction more than half the time to initiate, plan or complete simple activities  Memory Memory assist level: Recognizes or recalls 50 - 74% of the time/requires cueing 25 - 49% of the time   Pain No/Denies Pain   Therapy/Group: Individual Therapy  Felishia Wartman 04/12/2015, 4:29 PM

## 2015-04-12 NOTE — Progress Notes (Signed)
Physical Therapy Session Note  Patient Details  Name: [name] MRN: [id number] Date of Birth: 1917-12-10  Today's Date: 04/12/2015 PT Individual Time: 1000-1100 PT Individual Time Calculation (min): 60 min   Short Term Goals: Week 2:  PT Short Term Goal 1 (Week 2): Pt will demonstrate supine<>sit and scooting in bed with supervision.  PT Short Term Goal 2 (Week 2): Pt will demonstrate sit<>stand from a variety of surfaces with mod I PT Short Term Goal 3 (Week 2): Pt will ambulate 120 feet with RW and min A PT Short Term Goal 4 (Week 2): Pt will propel w/c 150' with supervision  Skilled Therapeutic Interventions/Progress Updates:    Gait Training: PT instructed patient in ambulation with RW x150' with supervision and cues for upright posture, walker position, and gait speed. PT administered TUG with RW with scores below. Pt required repeated verbal cuing for TUG instructions, asking every time she returned to the chair if she should sit down or proceed and required multiple cues to sit.  Pt states she feels "wobbly" today.  PT instructed pt in stair negotiation x12 steps with 2 hand rails and supervision.  Therex: PT instructed patient in BLE therex x20 reps for heel/toe raises, LAQ, and seated marching x20 reps. Pt requires max cuing to stay attended to task.   Pt returned to room at end of session and positioned in w/c with QRB in place, call bell in reach, and needs met.   Therapy Documentation Precautions:  Precautions Precautions: Fall Precaution Comments: Seizure precautions Restrictions Weight Bearing Restrictions: No  Pain: Pain Assessment Pain Assessment: No/denies pain  Balance: Balance Balance Assessed: Yes Standardized Balance Assessment Standardized Balance Assessment: Timed Up and Go Test Timed Up and Go Test TUG: Normal TUG Normal TUG (seconds): 39.79   Function:  Herbalist Devices: Grab bar or rail   Bed  Mobility Roll left and right activity      Sit to lying activity      Lying to sitting activity      Mobility details     Transfers Sit to stand transfer   Sit to stand assist level: No help, no cues, assistive device, takes more than a reasonable amount of time Sit to stand assistive device: Walker;Armrests  Chair/bed transfer   Chair/bed transfer method: Ambulatory Chair/bed transfer assist level: No Help, no cues, assistive device, takes more than a reasonable amount of time Chair/bed transfer assistive device: Counselling psychologist transfer assistive device: Grab bar;Elevated toilet seat/BSC over Therapist, sports     Max distance: 160 Assist level: Supervision or verbal cues  Walk 10 feet activity   Assist level: Supervision or verbal cues  Walk 50 feet with 2 turns activity   Assist level: Supervision or verbal cues  Walk 150 feet activity   Assist level: Supervision or verbal cues  Walk 10 feet on uneven surfaces activity      Stairs   Stairs assistive device: 2 hand rails Max number of stairs: 12 Stairs assist level: Supervision or verbal cues  Walk up/down 1 step activity     Walk up/down 1 step (curb) assist level: Supervision or verbal cues  Walk up/down 4 steps activity   Walk up/down 4 steps assist level: Supervision or verbal cues  Walk up/down 12 steps activity   Walk up/down 12 steps  assist level: Supervision or verbal cues  Pick up small objects from floor      Wheelchair   Type: Manual Max wheelchair distance: 200 Assist Level: Supervision or verbal cues  Wheel 50 feet with 2 turns activity   Assist Level: Supervision or verbal cues  Wheel 150 feet activity   Assist Level: Supervision or verbal cues   Cognition Comprehension Comprehension assist level: Understands basic 75 - 89% of the time/ requires cueing 10 - 24% of the time  Expression Expression assist level: Expresses basic 90%  of the time/requires cueing < 10% of the time.  Social Interaction Social Interaction assist level: Interacts appropriately 90% of the time - Needs monitoring or encouragement for participation or interaction.  Problem Solving Problem solving assist level: Solves basic 25 - 49% of the time - needs direction more than half the time to initiate, plan or complete simple activities  Memory Memory assist level: Recognizes or recalls 25 - 49% of the time/requires cueing 50 - 75% of the time    Therapy/Group: Individual Therapy  Earnest Conroy Penven-Crew 04/12/2015, 12:24 PM

## 2015-04-12 NOTE — Progress Notes (Signed)
Subjective/Complaints: Had a fair night. Denies new pain. Right knee still sore.  ROS: Pt denies fever, rash/itching, headache, blurred or double vision, nausea, vomiting, abdominal pain, diarrhea, chest pain, shortness of breath, palpitations, dysuria, dizziness, neck or back pain, bleeding, anxiety, or depression   Objective: Vital Signs: Blood pressure 116/64, pulse 89, temperature 98.1 F (36.7 C), temperature source Oral, resp. rate 16, height 5\' 3"  (1.6 m), weight 49.85 kg (109 lb 14.4 oz), SpO2 97 %. No results found. No results found for this or any previous visit (from the past 72 hour(s)).   HEENT: normal Cardio: RRR and no murmur Resp: CTA B/L and unlabored GI: BS positive and NT, ND Extremity:  No Edema Skin:   Intact Neuro: Flat, Abnormal Sensory parathesia to LT in RLE and 4 to 4+/5 Motor Musc/Skel:  Other Bilateral Hallux valgus deformities. Right knee tender to palpation with no obvious effusion, mild crepitus. Low back with minimal tenderness at rest Gen NAD   Assessment/Plan: 1. Functional deficits secondary to  Left Temporal ICH due to hypertensive crisis which require 3+ hours per day of interdisciplinary therapy in a comprehensive inpatient rehab setting. Physiatrist is providing close team supervision and 24 hour management of active medical problems listed below. Physiatrist and rehab team continue to assess barriers to discharge/monitor patient progress toward functional and medical goals.  FIM: Function - Bathing Position: Shower Body parts bathed by patient: Right arm, Left arm, Chest, Abdomen, Front perineal area, Buttocks, Right upper leg, Left upper leg Body parts bathed by helper: Right lower leg, Left lower leg, Back Assist Level: Touching or steadying assistance(Pt > 75%)  Function- Upper Body Dressing/Undressing What is the patient wearing?: Pull over shirt/dress, Bra Bra - Perfomed by patient: Thread/unthread right bra strap, Thread/unthread  left bra strap, Hook/unhook bra (pull down sports bra) Pull over shirt/dress - Perfomed by patient: Thread/unthread right sleeve, Thread/unthread left sleeve, Put head through opening, Pull shirt over trunk Assist Level: Set up Function - Lower Body Dressing/Undressing Lower body dressing/undressing activity did not occur: 2: Max-Patient completed 25-49% of tasks What is the patient wearing?: Pants, Underwear, Ted Hose Position: Standing at sink Underwear - Performed by patient: Pull underwear up/down, Thread/unthread right underwear leg Underwear - Performed by helper: Thread/unthread left underwear leg Pants- Performed by patient: Thread/unthread right pants leg, Thread/unthread left pants leg, Pull pants up/down Non-skid slipper socks- Performed by patient: Don/doff right sock, Don/doff left sock TED Hose - Performed by helper: Don/doff right TED hose, Don/doff left TED hose Assist Level: Touching or steadying assistance (Pt > 75%)  Function - Toileting Toileting steps completed by patient: Adjust clothing prior to toileting Toileting steps completed by helper: Performs perineal hygiene, Adjust clothing after toileting Toileting Assistive Devices: Grab bar or rail Assist level: More than reasonable time, Supervision or verbal cues, Touching or steadying assistance (Pt.75%)  Function - Air cabin crew transfer assistive device: Grab bar Assist level to toilet: Touching or steadying assistance (Pt > 75%) Assist level from toilet: Touching or steadying assistance (Pt > 75%)  Function - Chair/bed transfer Chair/bed transfer method: Ambulatory Chair/bed transfer assist level: Supervision or verbal cues Chair/bed transfer assistive device: Walker Chair/bed transfer details: Visual cues/gestures for precautions/safety  Function - Locomotion: Wheelchair Will patient use wheelchair at discharge?: Yes Type: Manual Max wheelchair distance: 200 Assist Level: Supervision or verbal  cues Assist Level: Supervision or verbal cues Assist Level: Supervision or verbal cues Function - Locomotion: Ambulation Max distance: 150 Assist level: Supervision or verbal cues  Assist level: Supervision or verbal cues Assist level: Supervision or verbal cues Assist level: Supervision or verbal cues  Function - Comprehension Comprehension: Auditory Comprehension assistive device: Other (Hard of hearing) Comprehension assist level: Understands basic 75 - 89% of the time/ requires cueing 10 - 24% of the time  Function - Expression Expression: Verbal Expression assist level: Expresses basic 90% of the time/requires cueing < 10% of the time.  Function - Social Interaction Social Interaction assist level: Interacts appropriately 90% of the time - Needs monitoring or encouragement for participation or interaction.  Function - Problem Solving Problem solving assist level: Solves basic 50 - 74% of the time/requires cueing 25 - 49% of the time  Function - Memory Memory assist level: Recognizes or recalls 25 - 49% of the time/requires cueing 50 - 75% of the time Patient normally able to recall (first 3 days only): Current season, Location of own room, Staff names and faces, That he or she is in a hospital  Medical Problem List and Plan: 1. Functional deficits secondary to right temporal intraparenchymal hemorrhage secondary to hypertensive crisis- encephalopathy related to [place], lethargy likely multifactorial including CVA effect  -more alert after Keppra D/Ced 8/11  -given age/CVA she is unlikely to rapidly return to baseline cognitve level as expected by family    2. DVT Prophylaxis/Anticoagulation: Subcutaneous heparin to continue 3. Pain Management: Voltaren gel 4 times a day to right knee, scheduled ice  -it sounds as if she's had a recent knee injection within the last 4-6 weeks  - neoprene knee sleeve for right knee support  -not a good candidate for even tramadol given  cognition/arousal 4. Seizure prophylaxis.    2nd EEG negative for seizure,discussed with Neuro--keppra stopped 5. Neuropsych: This patient is capable of making decisions on her own behalf. 6. Skin/Wound Care: Routine skin checks 7. Fluids/Electrolytes/Nutrition: Routine I&O, protein malnutrition  -eating much better 8. Hypertension. Cozaar 100 mg daily. Monitor with increased mobility, currently under control 9. Hypokalemia. resolved 10. New bradycardia/ afib:HR reasonable even with exertion  - No anticoag (except Subq hep for DVT prophyl) due to hemmorhagic CVA  LOS (Days) 14 A FACE TO FACE EVALUATION WAS PERFORMED  SWARTZ,ZACHARY T 04/12/2015, 8:45 AM

## 2015-04-13 ENCOUNTER — Inpatient Hospital Stay (HOSPITAL_COMMUNITY): Payer: Medicare Other | Admitting: Speech Pathology

## 2015-04-13 ENCOUNTER — Inpatient Hospital Stay (HOSPITAL_COMMUNITY): Payer: Medicare Other | Admitting: Occupational Therapy

## 2015-04-13 NOTE — Progress Notes (Signed)
Occupational Therapy Session Note  Patient Details  Name: [name] MRN: [id number] Date of Birth: 1918/05/09  Today's Date: 04/13/2015 OT Individual Time: 1430-1500 OT Individual Time Calculation (min): 30 min    Short Term Goals: Week 2:  OT Short Term Goal 1 (Week 2): STG=LTGs secondary to ELOS  Skilled Therapeutic Interventions/Progress Updates:  Upon entering the room, pt sleeping supine in bed. Pt with no c/o pain this session. However, pt very disoriented and believing it to be the next morning. Pt required max verbal cues for orientation to time. Pt ambulated with RW and close supervision into bathroom for toilet. Toilet transfer with steady assist for safety. Pt able to perform hygiene and clothing management with steady assist. Pt then ambulating in hall way a total of 130' with close supervision. Pt returning to bed and supine in bed with bed alarm activated. Call bell and all needed items within reach upon exiting the room.   Therapy Documentation Precautions:  Precautions Precautions: Fall Precaution Comments: Seizure precautions Restrictions Weight Bearing Restrictions: No General:   Vital Signs: Therapy Vitals Temp: 98.9 F (37.2 C) Temp Source: Oral Pulse Rate: 80 Resp: 18 BP: (!) 107/52 mmHg Patient Position (if appropriate): Sitting Oxygen Therapy SpO2: 97 % O2 Device: Not Delivered Pain: Pain Assessment Pain Assessment: No/denies pain ADL:   Exercises:   Other Treatments:    Function:   Eating Eating               Grooming Oral Care,Brush Teeth, Clean Dentures Activity:             Wash, Rinse, Dry Face Activity          Wash, Rinse, Dry Hands Activity          Brush, Comb Hair Activity   Assist Level: Touching or steadying assistance(Pt > 75%)    Shave Activity          Apply Makeup Activity                                                             Bathing Bathing position      Bathing parts      Bathing  assist         Upper Body Dressing/Undressing Upper body dressing                    Upper body assist         Lower Body Dressing/Undressing Lower body dressing                                  Lower body assist         Toileting Toileting   Toileting steps completed by patient: Adjust clothing prior to toileting;Performs perineal hygiene;Adjust clothing after toileting      Toileting assist Assist level: Touching or steadying assistance (Pt.75%)    Bed Mobility Roll left and right activity      Sit to lying activity      Lying to sitting activity      Mobility details     Transfers Sit to stand transfer   Sit to stand assist level: Supervision or verbal cues    Chair/bed transfer  Toilet transfer   Toilet transfer assistive device: Grab bar;Elevated toilet seat/BSC over toilet;Walker       Assist level to toilet: Touching or steadying assistance (Pt > 75%) Assist level from toilet: Touching or steadying assistance (Pt > 75%)  Tub/shower transfer             Cognition Comprehension Comprehension assist level: Understands basic 50 - 74% of the time/ requires cueing 25 - 49% of the time  Expression Expression assist level: Expresses basic 90% of the time/requires cueing < 10% of the time.  Social Interaction Social Interaction assist level: Interacts appropriately 90% of the time - Needs monitoring or encouragement for participation or interaction.  Problem Solving Problem solving assist level: Solves basic 25 - 49% of the time - needs direction more than half the time to initiate, plan or complete simple activities  Memory Memory assist level: Recognizes or recalls 25 - 49% of the time/requires cueing 50 - 75% of the time    Therapy/Group: Individual Therapy  Phineas Semen 04/13/2015, 3:51 PM

## 2015-04-13 NOTE — Progress Notes (Signed)
Occupational Therapy Session Note  Patient Details  Name: [name] MRN: [id number] Date of Birth: June 09, 1918  Today's Date: 04/13/2015 OT Individual Time: 1030-1155 OT Individual Time Calculation (min): 85 min    Short Term Goals: Week 2:  OT Short Term Goal 1 (Week 2): STG=LTGs secondary to ELOS  Skilled Therapeutic Interventions/Progress Updates:  Upon entering the room, pt seated in wheelchair with NT present in room. Pt reporting NT assisted with bathing and dressing prior to therapist arrival. Pt stating, "I am exhausted." OT propelled pt via wheelchair outside for energy conservation. OT leading pt in wheelchair aerobic exercises while outside with pt able to engaged in 5 minutes x 2 reps with rest breaks in between. Pt then required to navigate through hospital back to room with mod verbal guidance cues. Pt utilizing signage for assist with task as well. Pt propelling wheelchair 200' with B UEs and 2 rest breaks secondary to fatigue. Pt seated in wheelchair with call bell and all needed items within reach upon exiting the room.   Therapy Documentation Precautions:  Precautions Precautions: Fall Precaution Comments: Seizure precautions Restrictions Weight Bearing Restrictions: No Pain: Pain Assessment Pain Assessment: No/denies pain  Function:     Grooming Oral Care,Brush Teeth, Clean Dentures Activity:             Wash, Rinse, Dry Face Activity          Wash, Rinse, Dry Hands Activity          Brush, Comb Hair Activity   Assist Level: Touching or steadying assistance(Pt > 75%)    Shave Activity          Apply Makeup Activity                                                                 Cognition Comprehension Comprehension assist level: Understands basic 50 - 74% of the time/ requires cueing 25 - 49% of the time  Expression Expression assist level: Expresses basic 90% of the time/requires cueing < 10% of the time.  Social Interaction  Social Interaction assist level: Interacts appropriately 90% of the time - Needs monitoring or encouragement for participation or interaction.  Problem Solving Problem solving assist level: Solves basic 25 - 49% of the time - needs direction more than half the time to initiate, plan or complete simple activities  Memory Memory assist level: Recognizes or recalls 25 - 49% of the time/requires cueing 50 - 75% of the time    Therapy/Group: Individual Therapy  Phineas Semen 04/13/2015, 12:37 PM

## 2015-04-13 NOTE — Progress Notes (Addendum)
Subjective/Complaints: "What is the procedure today?" Surprised that it is Sat. ROS: Pt denies fever, rash/itching, headache, blurred or double vision, nausea, vomiting, abdominal pain, diarrhea, chest pain, shortness of breath, palpitations, dysuria, dizziness, neck or back pain, bleeding, anxiety, or depression   Objective: Vital Signs: Blood pressure 135/65, pulse 74, temperature 97.9 F (36.6 C), temperature source Oral, resp. rate 18, height 5\' 3"  (1.6 m), weight 49.85 kg (109 lb 14.4 oz), SpO2 99 %. No results found. No results found for this or any previous visit (from the past 72 hour(s)).   HEENT: normal Cardio: IRRR and no murmur Resp: CTA B/L and unlabored GI: BS positive and NT, ND Extremity:  No Edema Skin:   Intact Neuro: Flat, Abnormal Sensory parathesia to LT in RLE and 4 to 4+/5 Motor, oriented to person and place not time Musc/Skel:  Other Bilateral Hallux valgus deformities. Right knee tender to palpation with no obvious effusion, mild crepitus. Low back with minimal tenderness at rest Gen NAD   Assessment/Plan: 1. Functional deficits secondary to  Left Temporal ICH due to hypertensive crisis which require 3+ hours per day of interdisciplinary therapy in a comprehensive inpatient rehab setting. Physiatrist is providing close team supervision and 24 hour management of active medical problems listed below. Physiatrist and rehab team continue to assess barriers to discharge/monitor patient progress toward functional and medical goals.  FIM: Function - Bathing Position: Shower Body parts bathed by patient: Right arm, Left arm, Chest, Abdomen, Front perineal area, Buttocks, Right upper leg, Left upper leg Body parts bathed by helper: Right lower leg, Left lower leg, Back Assist Level: Touching or steadying assistance(Pt > 75%)  Function- Upper Body Dressing/Undressing What is the patient wearing?: Pull over shirt/dress Bra - Perfomed by patient: Thread/unthread  right bra strap, Thread/unthread left bra strap, Hook/unhook bra (pull down sports bra) Pull over shirt/dress - Perfomed by patient: Thread/unthread right sleeve, Thread/unthread left sleeve, Put head through opening, Pull shirt over trunk Assist Level: Supervision or verbal cues Function - Lower Body Dressing/Undressing Lower body dressing/undressing activity did not occur: 2: Max-Patient completed 25-49% of tasks What is the patient wearing?: Maryln Manuel, Underwear, Pants Position: Standing at sink Underwear - Performed by patient: Pull underwear up/down, Thread/unthread right underwear leg, Thread/unthread left underwear leg Underwear - Performed by helper: Thread/unthread left underwear leg Pants- Performed by patient: Thread/unthread right pants leg, Thread/unthread left pants leg, Pull pants up/down, Fasten/unfasten pants Non-skid slipper socks- Performed by patient: Don/doff right sock, Don/doff left sock TED Hose - Performed by helper: Don/doff right TED hose, Don/doff left TED hose Assist Level: Touching or steadying assistance (Pt > 75%)  Function - Toileting Toileting steps completed by patient: Adjust clothing prior to toileting, Performs perineal hygiene Toileting steps completed by helper: Adjust clothing after toileting Toileting Assistive Devices: Grab bar or rail Assist level: Supervision or verbal cues, Touching or steadying assistance (Pt.75%)  Function - Air cabin crew transfer assistive device: Grab bar, Elevated toilet seat/BSC over toilet Assist level to toilet: Touching or steadying assistance (Pt > 75%) Assist level from toilet: Touching or steadying assistance (Pt > 75%)  Function - Chair/bed transfer Chair/bed transfer method: Ambulatory Chair/bed transfer assist level: No Help, no cues, assistive device, takes more than a reasonable amount of time Chair/bed transfer assistive device: Walker Chair/bed transfer details: Visual cues/gestures for  precautions/safety  Function - Locomotion: Wheelchair Will patient use wheelchair at discharge?: Yes Type: Manual Max wheelchair distance: 200 Assist Level: Supervision or verbal cues Assist Level:  Supervision or verbal cues Assist Level: Supervision or verbal cues Function - Locomotion: Ambulation Assistive device: Walker-rolling Max distance: 160 Assist level: Supervision or verbal cues Assist level: Supervision or verbal cues Assist level: Supervision or verbal cues Assist level: Supervision or verbal cues  Function - Comprehension Comprehension: Auditory Comprehension assistive device:  (hard of hearing) Comprehension assist level: Understands basic 50 - 74% of the time/ requires cueing 25 - 49% of the time  Function - Expression Expression: Verbal Expression assist level: Expresses basic 90% of the time/requires cueing < 10% of the time.  Function - Social Interaction Social Interaction assist level: Interacts appropriately 90% of the time - Needs monitoring or encouragement for participation or interaction.  Function - Problem Solving Problem solving assist level: Solves basic 25 - 49% of the time - needs direction more than half the time to initiate, plan or complete simple activities  Function - Memory Memory assist level: Recognizes or recalls 50 - 74% of the time/requires cueing 25 - 49% of the time Patient normally able to recall (first 3 days only): Staff names and faces, That he or she is in a hospital  Medical Problem List and Plan: 1. Functional deficits secondary to right temporal intraparenchymal hemorrhage secondary to hypertensive crisis- encephalopathy related to [place], lethargy likely multifactorial including CVA effect    -given age/CVA she is unlikely to rapidly return to baseline cognitve level as expected by family    2. DVT Prophylaxis/Anticoagulation: Subcutaneous heparin to continue 3. Pain Management: Voltaren gel 4 times a day to right knee,  scheduled ice, no current knee pain  -it sounds as if she's had a recent knee injection within the last 4-6 weeks  - neoprene knee sleeve for right knee support  -not a good candidate for even tramadol given cognition/arousal  5. Neuropsych: This patient is capable of making decisions on her own behalf. 6. Skin/Wound Care: Routine skin checks, no breakdown but at risk 7. Fluids/Electrolytes/Nutrition: Routine I&O, protein malnutrition  -eating 90-95% meals 8. Hypertension. Cozaar 100 mg daily. Monitor with increased mobility, currently under control, 135/65 this am 9. Hypokalemia. resolved   LOS (Days) Lincoln E 04/13/2015, 7:07 AM

## 2015-04-13 NOTE — Progress Notes (Signed)
Speech Language Pathology Daily Session Note  Patient Details  Name: [name] MRN: [id number] Date of Birth: [birth year]  Today's Date: 04/13/2015 SLP Individual Time: 0900-0930 SLP Individual Time Calculation (min): 30 min  Short Term Goals: Week 2: SLP Short Term Goal 1 (Week 2): Patient will demonstrate efficient mastication of Dys. 3 textures without overt s/s of aspiration over 2 consecutive sessions with Min A verbal cues.  SLP Short Term Goal 1 - Progress (Week 2): Met SLP Short Term Goal 2 (Week 2): Patient will consume current diet with minimal overt s/s of aspiration with supervision verbal cues for use of swallowing compensatory strategies.  SLP Short Term Goal 2 - Progress (Week 2): Met SLP Short Term Goal 3 (Week 2): Patient will demonstrate functional problem solving for basic and familiar tasks with Min A verbal cues.  SLP Short Term Goal 3 - Progress (Week 2): Not met SLP Short Term Goal 4 (Week 2): Patient will utilize call bell to request assistance in 50% of observable opportunities with Mod A question cues.  SLP Short Term Goal 4 - Progress (Week 2): Met SLP Short Term Goal 5 (Week 2): Patient will sustain attention to a functional task for 10 minutes with Min A verbal cues.  SLP Short Term Goal 5 - Progress (Week 2): Not met SLP Short Term Goal 6 (Week 2): Patient will utilize external memory aids to increase orientation with supervision verbal and question cues.  SLP Short Term Goal 6 - Progress (Week 2): Not met  Skilled Therapeutic Interventions: Skilled ST intervention provided with focus on cognitive and dysphagia goals. Pt seated upright in w/c 90 degrees. Slp trialed D4 textures with thin liquids. Pt noted with increased mastication time (15-20 seconds) with regular consistency boluses. Minimal stasis noted, all of which cleared with liquid wash. No overt s/s of aspiration with all consistencies. Slp presented pt with printed therapy schedule to increase  comprehension of planned daily events. Pt required max A to locate therapies scheduled for this date. Slp modified schedule with use of bright/bold colors to separate dates/disciplines. Pt able to locate 3 daily events with modifications in place and mod A. Pt recalled therapist names with 80% accuracy and use of external aids. Safety precautions verbalized with 70% accuracy, min A.    Function:  Eating Eating                 Cognition Comprehension    Expression      Social Interaction    Problem Solving    Memory      Pain Pain Assessment Pain Assessment: No/denies pain  Therapy/Group: Individual Therapy  Alvey Brockel, Bernerd Pho 04/13/2015, 12:23 PM

## 2015-04-14 ENCOUNTER — Inpatient Hospital Stay (HOSPITAL_COMMUNITY): Payer: Medicare Other | Admitting: Occupational Therapy

## 2015-04-14 DIAGNOSIS — I612 Nontraumatic intracerebral hemorrhage in hemisphere, unspecified: Secondary | ICD-10-CM

## 2015-04-14 MED ORDER — SENNOSIDES-DOCUSATE SODIUM 8.6-50 MG PO TABS
1.0000 | ORAL_TABLET | Freq: Every day | ORAL | Status: DC
Start: 2015-04-15 — End: 2015-04-17
  Administered 2015-04-15 – 2015-04-16 (×2): 1 via ORAL
  Filled 2015-04-14 (×2): qty 1

## 2015-04-14 NOTE — Progress Notes (Signed)
Occupational Therapy Session Note  Patient Details  Name: [name] MRN: [id number] Date of Birth: 06-10-1918  Today's Date: 04/14/2015 OT Individual Time: 8341-9622 OT Individual Time Calculation (min): 84 min    Short Term Goals: Week 2:  OT Short Term Goal 1 (Week 2): STG=LTGs secondary to ELOS  Skilled Therapeutic Interventions/Progress Updates:  Upon entering the room, pt supine in bed with no c/o pain. Skilled OT session with focus on self care retraining, dynamic standing balance, functional transfers, and safety awareness. Pt engaged in bathing at shower level this session with min guard assist for sit <>stand to wash buttocks and peri area. Pt requiring min verbal cues throughout session for safety such as waiting for therapist before standing and locking wheelchair brakes. Dressing performed on EOB and steady assist for grooming while standing at sink side. Pt required multiple rest breaks secondary to fatigue this session. See below for further details.   Therapy Documentation Precautions:  Precautions Precautions: Fall Precaution Comments: Seizure precautions Restrictions Weight Bearing Restrictions: No  Pain: Pain Assessment Pain Assessment: No/denies pain  Function:     Grooming Oral Care,Brush Teeth, Clean Dentures Activity:      Assist Level: Touching or steadying assistance(Pt > 75%)      Wash, Rinse, Dry Face Activity   Assist Level: Touching or steadying assistance(Pt > 75%)      Wash, Rinse, Dry Hands Activity   Assist Level: Touching or steadying assistance(Pt > 75%)      Brush, Comb Hair Activity   Assist Level: Touching or steadying assistance(Pt > 75%)    Shave Activity Shave activity did not occur: Patient does not shave        Apply Makeup Activity Apply makeup activity did not occur: Patient does not wear makeup                                                           Bathing Bathing position   Position: Shower  Bathing  parts Body parts bathed by patient: Right arm;Left arm;Chest;Abdomen;Front perineal area;Buttocks;Right upper leg;Left upper leg;Right lower leg;Left lower leg Body parts bathed by helper: Back  Bathing assist Assist Level: Touching or steadying assistance(Pt > 75%)       Upper Body Dressing/Undressing Upper body dressing   What is the patient wearing?: Bra;Pull over shirt/dress Bra - Perfomed by patient: Thread/unthread right bra strap;Thread/unthread left bra strap;Hook/unhook bra (pull down sports bra)   Pull over shirt/dress - Perfomed by patient: Thread/unthread right sleeve;Thread/unthread left sleeve;Put head through opening;Pull shirt over trunk          Upper body assist Assist Level: Supervision or verbal cues       Lower Body Dressing/Undressing Lower body dressing   What is the patient wearing?: Ted Hose;Underwear;Pants Underwear - Performed by patient: Pull underwear up/down;Thread/unthread right underwear leg;Thread/unthread left underwear leg   Pants- Performed by patient: Thread/unthread right pants leg;Thread/unthread left pants leg;Pull pants up/down;Fasten/unfasten pants                     TED Hose - Performed by helper: Don/doff right TED hose;Don/doff left TED hose  Lower body assist Assist Level: Touching or steadying assistance (Pt > 75%)       Toileting Toileting   Toileting steps completed by patient: Adjust clothing  prior to toileting;Performs perineal hygiene;Adjust clothing after toileting      Toileting assist Assist level: Supervision or verbal cues    Bed Mobility Roll left and right activity   Assist level: Supervision or verbal cues  Sit to lying activity   Assist level: Supervision or verbal cues  Lying to sitting activity   Assist level: Supervision or verbal cues  Mobility details     Transfers Sit to stand transfer   Sit to stand assist level: Supervision or verbal cues Sit to stand assistive device: Armrests;Walker   Chair/bed transfer   Chair/bed transfer method: Ambulatory Chair/bed transfer assist level: Supervision or verbal cues Chair/bed transfer assistive device: Walker   Chair/bed transfer details: Visual cues/gestures for Actuary transfer assistive device: Grab bar;Elevated toilet seat/BSC over toilet;Walker       Assist level to toilet: Supervision or verbal cues Assist level from toilet: Supervision or verbal cues  Tub/shower transfer   Tub/shower assistive device: Tub transfer bench;Grab bars;Walk in shower   Assist level into tub: Supervision or verbal cues Assist level out of tub: Supervision or verbal cues   Cognition Comprehension Comprehension assist level: Understands basic 75 - 89% of the time/ requires cueing 10 - 24% of the time  Expression Expression assist level: Expresses basic 90% of the time/requires cueing < 10% of the time.  Social Interaction Social Interaction assist level: Interacts appropriately 90% of the time - Needs monitoring or encouragement for participation or interaction.  Problem Solving Problem solving assist level: Solves basic 50 - 74% of the time/requires cueing 25 - 49% of the time  Memory Memory assist level: Recognizes or recalls 75 - 89% of the time/requires cueing 10 - 24% of the time    Therapy/Group: Individual Therapy  Phineas Semen 04/14/2015, 12:08 PM

## 2015-04-14 NOTE — Progress Notes (Addendum)
Subjective/Complaints: Working with OT, standing with walker ROS: Pt denies fever, rash/itching, headache,  nausea, vomiting, abdominal pain, diarrhea,shortness of breath, palpitations, dysuria, dizziness,    Objective: Vital Signs: Blood pressure 142/81, pulse 75, temperature 98.1 F (36.7 C), temperature source Oral, resp. rate 18, height 5\' 3"  (1.6 m), weight 49.85 kg (109 lb 14.4 oz), SpO2 96 %. No results found. No results found for this or any previous visit (from the past 72 hour(s)).   HEENT: normal Cardio: IRRR and no murmur Resp: CTA B/L and unlabored GI: BS positive and NT, ND Extremity:  No Edema Skin:   Intact Neuro: Flat, Abnormal Sensory parathesia to LT in RLE and 4 to 4+/5 Motor BUE and BLE, oriented to person and place not time Musc/Skel: Right knee tender to palpation with no obvious effusion, mild crepitus. Low back with minimal tenderness at rest Gen NAD   Assessment/Plan: 1. Functional deficits secondary to  Left Temporal ICH due to hypertensive crisis which require 3+ hours per day of interdisciplinary therapy in a comprehensive inpatient rehab setting. Physiatrist is providing close team supervision and 24 hour management of active medical problems listed below. Physiatrist and rehab team continue to assess barriers to discharge/monitor patient progress toward functional and medical goals.  FIM: Function - Bathing Position: Shower Body parts bathed by patient: Right arm, Left arm, Chest, Abdomen, Front perineal area, Buttocks, Right upper leg, Left upper leg Body parts bathed by helper: Right lower leg, Left lower leg, Back Assist Level: Touching or steadying assistance(Pt > 75%)  Function- Upper Body Dressing/Undressing What is the patient wearing?: Pull over shirt/dress Bra - Perfomed by patient: Thread/unthread right bra strap, Thread/unthread left bra strap, Hook/unhook bra (pull down sports bra) Pull over shirt/dress - Perfomed by patient:  Thread/unthread right sleeve, Thread/unthread left sleeve, Put head through opening, Pull shirt over trunk Assist Level: Supervision or verbal cues Function - Lower Body Dressing/Undressing Lower body dressing/undressing activity did not occur: 2: Max-Patient completed 25-49% of tasks What is the patient wearing?: Maryln Manuel, Underwear, Pants Position: Standing at sink Underwear - Performed by patient: Pull underwear up/down, Thread/unthread right underwear leg, Thread/unthread left underwear leg Underwear - Performed by helper: Thread/unthread left underwear leg Pants- Performed by patient: Thread/unthread right pants leg, Thread/unthread left pants leg, Pull pants up/down, Fasten/unfasten pants Non-skid slipper socks- Performed by patient: Don/doff right sock, Don/doff left sock TED Hose - Performed by helper: Don/doff right TED hose, Don/doff left TED hose Assist Level: Touching or steadying assistance (Pt > 75%)  Function - Toileting Toileting steps completed by patient: Adjust clothing prior to toileting, Performs perineal hygiene, Adjust clothing after toileting Toileting steps completed by helper: Performs perineal hygiene, Adjust clothing after toileting Toileting Assistive Devices: Grab bar or rail Assist level: Supervision or verbal cues  Function Midwife transfer assistive device: Grab bar, Elevated toilet seat/BSC over toilet, Walker Assist level to toilet: Touching or steadying assistance (Pt > 75%) Assist level from toilet: Touching or steadying assistance (Pt > 75%)  Function - Chair/bed transfer Chair/bed transfer method: Ambulatory Chair/bed transfer assist level: No Help, no cues, assistive device, takes more than a reasonable amount of time Chair/bed transfer assistive device: Walker Chair/bed transfer details: Visual cues/gestures for precautions/safety  Function - Locomotion: Wheelchair Will patient use wheelchair at discharge?: Yes Type:  Manual Max wheelchair distance: 200 Assist Level: Supervision or verbal cues Assist Level: Supervision or verbal cues Assist Level: Supervision or verbal cues Function - Locomotion: Ambulation Assistive device: Walker-rolling  Max distance: 160 Assist level: Supervision or verbal cues Assist level: Supervision or verbal cues Assist level: Supervision or verbal cues Assist level: Supervision or verbal cues  Function - Comprehension Comprehension: Auditory Comprehension assistive device:  (hard of hearing) Comprehension assist level: Understands basic 75 - 89% of the time/ requires cueing 10 - 24% of the time  Function - Expression Expression: Verbal Expression assist level: Expresses basic 90% of the time/requires cueing < 10% of the time.  Function - Social Interaction Social Interaction assist level: Interacts appropriately 90% of the time - Needs monitoring or encouragement for participation or interaction.  Function - Problem Solving Problem solving assist level: Solves basic 50 - 74% of the time/requires cueing 25 - 49% of the time  Function - Memory Memory assist level: Recognizes or recalls 75 - 89% of the time/requires cueing 10 - 24% of the time Patient normally able to recall (first 3 days only): Staff names and faces, That he or she is in a hospital  Medical Problem List and Plan: 1. Functional deficits secondary to right temporal intraparenchymal hemorrhage secondary to hypertensive crisis- encephalopathy related to [place], lethargy likely multifactorial including CVA effect    -given age/CVA she is unlikely to rapidly return to baseline cognitve level as expected by family    2. DVT Prophylaxis/Anticoagulation: Subcutaneous heparin to continue 3. Pain Management: Voltaren gel 4 times a day to right knee, scheduled ice, no current knee pain  -it sounds as if she's had a recent knee injection within the last 4-6 weeks  - neoprene knee sleeve for right knee  support  -not a good candidate for even tramadol given cognition/arousal  5. Neuropsych: This patient is capable of making decisions on her own behalf. 6. Skin/Wound Care: Routine skin checks, no breakdown but at risk 7. Fluids/Electrolytes/Nutrition: Routine I&O, protein malnutrition  -eating 80-100% meals 8. Hypertension. Cozaar 100 mg daily. Monitor with increased mobility, currently under control, 142/81 this am 9. Hypokalemia. resolved 10.  Freq small BMs- reduce senna S to qd  LOS (Days) 16 A FACE TO FACE EVALUATION WAS PERFORMED  KIRSTEINS,ANDREW E 04/14/2015, 11:01 AM

## 2015-04-15 ENCOUNTER — Inpatient Hospital Stay (HOSPITAL_COMMUNITY): Payer: Medicare Other | Admitting: Speech Pathology

## 2015-04-15 ENCOUNTER — Inpatient Hospital Stay (HOSPITAL_COMMUNITY): Payer: Medicare Other | Admitting: Physical Therapy

## 2015-04-15 ENCOUNTER — Inpatient Hospital Stay (HOSPITAL_COMMUNITY): Payer: Medicare Other

## 2015-04-15 NOTE — Plan of Care (Signed)
Problem: RH PAIN MANAGEMENT Goal: RH STG PAIN MANAGED AT OR BELOW PT'S PAIN GOAL <3 Outcome: Progressing No c/o pain     

## 2015-04-15 NOTE — Progress Notes (Signed)
Speech Language Pathology Daily Session Note  Patient Details  Name: [name] MRN: [id number] Date of Birth: September 18, 1917  Today's Date: 04/15/2015 SLP Individual Time: 60-1530 SLP Individual Time Calculation (min): 55 min  Short Term Goals: Week 3: SLP Short Term Goal 1 (Week 3): Patient will demonstrate efficient mastication of regular textureswithout overt s/s of aspiration over 2 consecutive sessions with Min A verbal cues.  SLP Short Term Goal 2 (Week 3): Patient will consume current diet with minimal overt s/s of aspiration with Mod I for use of swallowing compensatory strategies.  SLP Short Term Goal 3 (Week 3): Patient will demonstrate functional problem solving for basic and familiar tasks with Min A verbal cues.  SLP Short Term Goal 4 (Week 3): Patient will sustain attention to a functional task for 10 minutes with Min A verbal cues.  SLP Short Term Goal 5 (Week 3): Patient will utilize external memory aids to increase orientation with supervision verbal and question cues.   Skilled Therapeutic Interventions: Skilled treatment session focused on cognitive goals. Upon arrival, patient was asleep while supine in bed but was easily awakened and was transferred to the wheelchair to increase arousal. Patient was oriented to place, time and situation but required Max A multimodal cues for orientation to time of day and Mod A multimodal cues for recall of events from previous therapy sessions. SLP facilitated session by providing Mod-Max A multimodal cues for problem solving and recall during a mildly complex task of organizing a grocery list while utilizing a store ad in the newspaper based on specific guidelines.  Patient also required Mod A verbal cues for selective attention to task for 5 minutes in a mildly distracting environment.  Patient left upright in wheelchair with quick release belt in place and all needs within reach. Continue with current plan of care.     Function:  Cognition Comprehension Comprehension assist level: Understands basic 75 - 89% of the time/ requires cueing 10 - 24% of the time  Expression   Expression assist level: Expresses basic 90% of the time/requires cueing < 10% of the time.  Social Interaction Social Interaction assist level: Interacts appropriately 90% of the time - Needs monitoring or encouragement for participation or interaction.  Problem Solving Problem solving assist level: Solves basic 50 - 74% of the time/requires cueing 25 - 49% of the time  Memory Memory assist level: Recognizes or recalls 50 - 74% of the time/requires cueing 25 - 49% of the time;Recognizes or recalls 25 - 49% of the time/requires cueing 50 - 75% of the time    Pain Pain Assessment Pain Assessment: No/denies pain  Therapy/Group: Individual Therapy  Miyuki Rzasa 04/15/2015, 4:16 PM

## 2015-04-15 NOTE — Progress Notes (Signed)
Subjective/Complaints: Overall feeling well. Feels that she's stronger. Neoprene knee sleeve helping a bit. ROS: Pt denies fever, rash/itching, headache,  nausea, vomiting, abdominal pain, diarrhea,shortness of breath, palpitations, dysuria, dizziness,    Objective: Vital Signs: Blood pressure 120/66, pulse 85, temperature 98.1 F (36.7 C), temperature source Oral, resp. rate 18, height 5\' 3"  (1.6 m), weight 49.85 kg (109 lb 14.4 oz), SpO2 98 %. No results found. No results found for this or any previous visit (from the past 72 hour(s)).   HEENT: normal Cardio: IRRR and no murmur Resp: CTA B/L and unlabored GI: BS positive and NT, ND Extremity:  No Edema Skin:   Intact Neuro: Flat, Abnormal Sensory parathesia to LT in RLE and 4 to 4+/5 Motor BUE and BLE, oriented to person and place not time Musc/Skel: Right knee tender to palpation with no obvious effusion, mild crepitus. Low back with minimal tenderness at rest Gen NAD   Assessment/Plan: 1. Functional deficits secondary to  Left Temporal ICH due to hypertensive crisis which require 3+ hours per day of interdisciplinary therapy in a comprehensive inpatient rehab setting. Physiatrist is providing close team supervision and 24 hour management of active medical problems listed below. Physiatrist and rehab team continue to assess barriers to discharge/monitor patient progress toward functional and medical goals.  FIM: Function - Bathing Position: Shower Body parts bathed by patient: Right arm, Left arm, Chest, Abdomen, Front perineal area, Buttocks, Right upper leg, Left upper leg, Right lower leg, Left lower leg Body parts bathed by helper: Back Assist Level: Touching or steadying assistance(Pt > 75%)  Function- Upper Body Dressing/Undressing What is the patient wearing?: Bra, Pull over shirt/dress Bra - Perfomed by patient: Thread/unthread right bra strap, Thread/unthread left bra strap Bra - Perfomed by helper: Hook/unhook  bra (pull down sports bra) Pull over shirt/dress - Perfomed by patient: Thread/unthread right sleeve, Thread/unthread left sleeve, Put head through opening, Pull shirt over trunk Assist Level: Supervision or verbal cues Function - Lower Body Dressing/Undressing Lower body dressing/undressing activity did not occur: 2: Max-Patient completed 25-49% of tasks What is the patient wearing?: Underwear, Pants, Socks, Shoes, [employer] Position: Wheelchair/chair at [employer] - Performed by patient: Thread/unthread right underwear leg, Thread/unthread left underwear leg, Pull underwear up/down Underwear - Performed by helper: Thread/unthread left underwear leg Pants- Performed by patient: Thread/unthread right pants leg, Thread/unthread left pants leg, Pull pants up/down, Fasten/unfasten pants Non-skid slipper socks- Performed by patient: Don/doff right sock, Don/doff left sock Shoes - Performed by helper: Don/doff right shoe, Don/doff left shoe, Fasten right, Fasten left TED Hose - Performed by helper: Don/doff right TED hose, Don/doff left TED hose Assist Level: Touching or steadying assistance (Pt > 75%)  Function - Toileting Toileting steps completed by patient: Adjust clothing prior to toileting, Performs perineal hygiene, Adjust clothing after toileting Toileting steps completed by helper: Performs perineal hygiene, Adjust clothing after toileting Toileting Assistive Devices: Grab bar or rail Assist level: Touching or steadying assistance (Pt.75%)  Function - Air cabin crew transfer assistive device: Walker, Grab bar, Elevated toilet seat/BSC over toilet Assist level to toilet: Supervision or verbal cues Assist level from toilet: Supervision or verbal cues  Function - Chair/bed transfer Chair/bed transfer method: Ambulatory Chair/bed transfer assist level: Supervision or verbal cues Chair/bed transfer assistive device: Walker Chair/bed transfer details: Verbal cues for  precautions/safety  Function - Locomotion: Wheelchair Will patient use wheelchair at discharge?: Yes Type: Manual Max wheelchair distance: 200 Assist Level: Supervision or verbal cues Assist Level: Supervision or  verbal cues Assist Level: Supervision or verbal cues Function - Locomotion: Ambulation Assistive device: Walker-rolling Max distance: 200 Assist level: Supervision or verbal cues Assist level: Supervision or verbal cues Assist level: Supervision or verbal cues Assist level: Supervision or verbal cues Assist level: Touching or steadying assistance (Pt > 75%)  Function - Comprehension Comprehension: Auditory Comprehension assistive device: Other (HOH) Comprehension assist level: Understands basic 75 - 89% of the time/ requires cueing 10 - 24% of the time  Function - Expression Expression: Verbal Expression assist level: Expresses basic 90% of the time/requires cueing < 10% of the time.  Function - Social Interaction Social Interaction assist level: Interacts appropriately 90% of the time - Needs monitoring or encouragement for participation or interaction.  Function - Problem Solving Problem solving assist level: Solves basic 50 - 74% of the time/requires cueing 25 - 49% of the time  Function - Memory Memory assist level: Recognizes or recalls 50 - 74% of the time/requires cueing 25 - 49% of the time Patient normally able to recall (first 3 days only): Staff names and faces, That he or she is in a hospital  Medical Problem List and Plan: 1. Functional deficits secondary to right temporal intraparenchymal hemorrhage secondary to hypertensive crisis- encephalopathy related to [place], lethargy likely multifactorial including CVA effect    -given age/CVA she is unlikely to rapidly return to baseline cognitve level as expected by family    2. DVT Prophylaxis/Anticoagulation: Subcutaneous heparin to continue 3. Pain Management: Voltaren gel 4 times a day to right knee,  scheduled ice, no current knee pain  -it sounds as if she's had a recent knee injection within the last 4-6 weeks  - neoprene knee sleeve for right knee support has helped per patient  -not a good candidate for even tramadol given cognition/arousal  5. Neuropsych: This patient is capable of making decisions on her own behalf. 6. Skin/Wound Care: Routine skin checks, no breakdown but at risk--- continue oob, nutritional efforts 7. Fluids/Electrolytes/Nutrition: Routine I&O, protein malnutrition  -eating 80-100% meals 8. Hypertension. Cozaar 100 mg daily. Monitor with increased mobility, currently under control, 142/81 this am 9. Hypokalemia. resolved 10.  Freq small BMs- reduced senna S to qd  LOS (Days) 17 A FACE TO FACE EVALUATION WAS PERFORMED  Kylie Simmonds T 04/15/2015, 3:54 PM

## 2015-04-15 NOTE — Plan of Care (Signed)
Problem: RH SKIN INTEGRITY Goal: RH STG SKIN FREE OF INFECTION/BREAKDOWN Min A  Outcome: Progressing No skin breakdown

## 2015-04-15 NOTE — Progress Notes (Signed)
Occupational Therapy Session Note  Patient Details  Name: [name] MRN: [id number] Date of Birth: 05-03-1918  Today's Date: 04/15/2015 OT Individual Time: 1000-1100 OT Individual Time Calculation (min): 60 min    Short Term Goals: Week 2:  OT Short Term Goal 1 (Week 2): STG=LTGs secondary to ELOS  Skilled Therapeutic Interventions/Progress Updates:    Pt resting in bed upon arrival.  Pt required max verbal cues with visual aids to orient to time of day.  Pt stated she had already bathed but did want to dress this morning.  Pt selected clothing and completed dressing tasks per function assist levels below.  Pt requested use of toilet and amb with RW to bathroom and returned to room and washed sink while standing.  Pt also brushed teeth while standing.  Pt requires more than a reasonable amount of time to complete tasks with multiple rest breaks.  Focus on in creased activity tolerance, functional amb with RW, standing balance, and safety awareness to increase independence with BADLs.  Therapy Documentation Precautions:  Precautions Precautions: Fall Precaution Comments: Seizure precautions Restrictions Weight Bearing Restrictions: No Pain: Pain Assessment Pain Assessment: No/denies pain  Function:     Grooming Oral Care,Brush Teeth, Clean Dentures Activity:      Assist Level: Supervision or verbal cues      Wash, Rinse, Dry Face Activity   Assist Level: Supervision or verbal cues      Wash, Rinse, Dry Hands Activity   Assist Level: Supervision or verbal cues      Brush, Comb Hair Activity   Assist Level: Supervision or verbal cues    Shave Activity Shave activity did not occur: Patient does not shave        Apply Makeup Activity Apply makeup activity did not occur: Patient does not wear makeup                                                             Upper Body Dressing/Undressing Upper body dressing   What is the patient wearing?: Bra;Pull over  shirt/dress Bra - Perfomed by patient: Thread/unthread right bra strap;Thread/unthread left bra strap Bra - Perfomed by helper: Hook/unhook bra (pull down sports bra) Pull over shirt/dress - Perfomed by patient: Thread/unthread right sleeve;Thread/unthread left sleeve;Put head through opening;Pull shirt over trunk          Upper body assist         Lower Body Dressing/Undressing Lower body dressing   What is the patient wearing?: Underwear;Pants;Socks;Shoes;[facility] Underwear - Performed by patient: Thread/unthread right underwear leg;Thread/unthread left underwear leg;Pull underwear up/down   Pants- Performed by patient: Thread/unthread right pants leg;Thread/unthread left pants leg;Pull pants up/down;Fasten/unfasten pants   Non-skid slipper socks- Performed by patient: Don/doff right sock;Don/doff left sock         Shoes - Performed by helper: Don/doff right shoe;Don/doff left shoe;Fasten right;Fasten left       TED Hose - Performed by helper: Don/doff right TED hose;Don/doff left TED hose  Lower body assist Assist Level: Touching or steadying assistance (Pt > 75%)       Toileting Toileting   Toileting steps completed by patient: Adjust clothing prior to toileting;Performs perineal hygiene;Adjust clothing after toileting   Toileting Assistive Devices: Grab bar or rail  Toileting assist Assist level: Touching or  steadying assistance (Pt.75%)      Transfers Sit to stand transfer   Sit to stand assist level: Supervision or verbal cues Sit to stand assistive device: Walker;Armrests  Chair/bed transfer   Chair/bed transfer method: Ambulatory Chair/bed transfer assist level: Supervision or verbal cues Chair/bed transfer assistive device: Walker   Chair/bed transfer details: Verbal cues for precautions/safety  Toilet transfer   Toilet transfer assistive device: Walker;Grab bar;Elevated toilet seat/BSC over toilet       Assist level to toilet: Supervision or verbal  cues Assist level from toilet: Supervision or verbal cues   Therapy/Group: Individual Therapy  Leroy Libman 04/15/2015, 12:29 PM

## 2015-04-15 NOTE — Progress Notes (Signed)
Physical Therapy Session Note  Patient Details  Name: [name] MRN: [id number] Date of Birth: 1917/12/14  Today's Date: 04/15/2015 PT Individual Time: 1100-1200 PT Individual Time Calculation (min): 60 min   Short Term Goals: Week 2:  PT Short Term Goal 1 (Week 2): Pt will demonstrate supine<>sit and scooting in bed with supervision.  PT Short Term Goal 2 (Week 2): Pt will demonstrate sit<>stand from a variety of surfaces with mod I PT Short Term Goal 3 (Week 2): Pt will ambulate 120 feet with RW and min A PT Short Term Goal 4 (Week 2): Pt will propel w/c 150' with supervision  Skilled Therapeutic Interventions/Progress Updates:    Pt received in w/c. Pt amb x200 feet with RW and supervision. Pt propelled w/c >200 feet with supervision, increased time, and verbal cues over uneven surfaces, around obstacles, up 3% grade, and through doorways. PT instructed patient in BLE x20 heel/toe raises, LAQ, seated marching. Pt negotiated 12 steps with 2 hand rails with supervision/steady A and verbal cues. Pt returned to room at end of session and positioned in w/c with QRB in place, call bell in reach, and needs met.   Therapy Documentation Precautions:  Precautions Precautions: Fall Precaution Comments: Seizure precautions Restrictions Weight Bearing Restrictions: No  Pain: Pain Assessment Pain Assessment: No/denies pain    Function:    Transfers Sit to stand transfer   Sit to stand assist level: Supervision or verbal cues Sit to stand assistive device: Walker;Armrests  Chair/bed transfer   Chair/bed transfer method: Ambulatory Chair/bed transfer assist level: Supervision or verbal cues Chair/bed transfer assistive device: Walker   Chair/bed transfer details: Verbal cues for Youth worker device: Walker-rolling Max distance: 200 Assist level: Supervision or verbal cues  Walk 10  feet activity   Assist level: Supervision or verbal cues  Walk 50 feet with 2 turns activity   Assist level: Supervision or verbal cues  Walk 150 feet activity   Assist level: Supervision or verbal cues  Walk 10 feet on uneven surfaces activity   Assist level: Touching or steadying assistance (Pt > 75%)  Stairs   Stairs assistive device: 2 hand rails Max number of stairs: 12 Stairs assist level: Supervision or verbal cues  Walk up/down 1 step activity     Walk up/down 1 step (curb) assist level: Supervision or verbal cues  Walk up/down 4 steps activity   Walk up/down 4 steps assist level: Supervision or verbal cues  Walk up/down 12 steps activity   Walk up/down 12 steps assist level: Supervision or verbal cues  Pick up small objects from floor      Wheelchair   Type: Manual Max wheelchair distance: 200 Assist Level: Supervision or verbal cues  Wheel 50 feet with 2 turns activity   Assist Level: Supervision or verbal cues  Wheel 150 feet activity   Assist Level: Supervision or verbal cues     Therapy/Group: Individual Therapy  Earnest Conroy Penven-Crew 04/15/2015, 12:16 PM

## 2015-04-16 ENCOUNTER — Inpatient Hospital Stay (HOSPITAL_COMMUNITY): Payer: Medicare Other | Admitting: Speech Pathology

## 2015-04-16 ENCOUNTER — Inpatient Hospital Stay (HOSPITAL_COMMUNITY): Payer: Medicare Other | Admitting: Physical Therapy

## 2015-04-16 ENCOUNTER — Inpatient Hospital Stay (HOSPITAL_COMMUNITY): Payer: Self-pay

## 2015-04-16 NOTE — Progress Notes (Addendum)
Physical Therapy Discharge Summary  Patient Details  Name: [name] MRN: [id number] Date of Birth: [birth year]  Today's Date: 04/16/2015 PT Individual Time: 1000-1100 PT Individual Time Calculation (min): 60 min    Patient has met 6 of 6 long term goals due to improved activity tolerance, improved balance, increased strength, increased range of motion, improved attention, improved awareness and improved coordination.  Patient to discharge at an ambulatory level Supervision.   Patient's family has arranged for 24/7 care giver at home.  to provide the necessary supervision assistance at discharge.  Recommendation:  Patient will benefit from ongoing skilled PT services in home health setting to continue to advance safe functional mobility, address ongoing impairments in balance, activity tolerance, strength, postural control, and minimize fall risk.  Equipment: 18x16 w/c and RW  Reasons for discharge: treatment goals met  Patient/family agrees with progress made and goals achieved: Yes   Skilled PT Intervention:     PT Discharge Precautions/Restrictions Precautions Precautions: Fall Restrictions Weight Bearing Restrictions: No   Pain Pain Assessment Pain Assessment: No/denies pain   Cognition Overall Cognitive Status: Impaired/Different from baseline Arousal/Alertness: Awake/alert Orientation Level: Oriented X4 Safety/Judgment: Impaired Sensation Sensation Light Touch: Appears Intact Coordination Gross Motor Movements are Fluid and Coordinated: Yes Fine Motor Movements are Fluid and Coordinated: No (finger opposition mildly impaired bilaterally L>R) Finger Nose Finger Test: slow, but (-) for dysmetria Heel Shin Test: slow, (+) dysmetria on L Motor  Motor Motor: Within Functional Limits  Mobility Bed Mobility Bed Mobility: Supine to Sit;Sit to Supine Supine to Sit: 6: Modified independent (Device/Increase time) Sit to Supine: 6: Modified independent  (Device/Increase time) Transfers Transfers: Yes Sit to Stand: 6: Modified independent (Device/Increase time) Stand to Sit: 6: Modified independent (Device/Increase time) Stand Pivot Transfers: 6: Modified independent (Device/Increase time) Locomotion  Ambulation Ambulation: Yes Ambulation/Gait Assistance: 5: Supervision Ambulation Distance (Feet): 200 Feet Assistive device: Rolling walker Ambulation/Gait Assistance Details: Verbal cues for safe use of DME/AE;Verbal cues for gait pattern Gait Gait: Yes Gait Pattern: Decreased step length - left;Decreased step length - right;Step-through pattern;Shuffle;Trunk flexed Stairs / Additional Locomotion Stairs: Yes Stairs Assistance: 5: Supervision Stair Management Technique: Two rails Number of Stairs: 12 Height of Stairs: 6 Wheelchair Mobility Wheelchair Mobility: Yes Wheelchair Assistance: 5: Careers information officer: Both upper extremities Distance: 150     Balance Balance Balance Assessed: Yes Ambulance person Sitting - Balance Support: Feet supported Static Sitting - Level of Assistance: 6: Modified independent (Device/Increase time) Dynamic Sitting Balance Dynamic Sitting - Balance Support: Feet supported Dynamic Sitting - Level of Assistance: 6: Modified independent (Device/Increase time) Dynamic Sitting - Balance Activities: Lateral lean/weight shifting;Forward lean/weight shifting;Reaching for objects;Reaching across midline;Trunk control activities Static Standing Balance Static Standing - Balance Support: During functional activity;Bilateral upper extremity supported Static Standing - Level of Assistance: 6: Modified independent (Device/Increase time) Dynamic Standing Balance Dynamic Standing - Balance Support: During functional activity;Left upper extremity supported;Right upper extremity supported Dynamic Standing - Level of Assistance: 5: Stand by assistance Dynamic Standing - Balance  Activities: Forward lean/weight shifting;Lateral lean/weight shifting;Reaching across midline   Extremity Assessment  RLE AROM (degrees) Overall AROM Right Lower Extremity: Within functional limits for tasks assessed RLE Strength RLE Overall Strength: Within Functional Limits for tasks assessed RLE Overall Strength Comments: 3+/5 for hip flexion and knee flexion, 4/5 for knee extension  LLE Assessment LLE Assessment: Within Functional Limits LLE AROM (degrees) Overall AROM Left Lower Extremity: Within functional limits for tasks assessed LLE Strength LLE Overall Strength: Within  Functional Limits for tasks assessed LLE Overall Strength Comments: 4/5 for hip flexion, 4+/5 for knee flexion/extension  Function:   Bed Mobility Roll left and right activity   Assist level: No help, No cues, assistive device, takes more than a reasonable amount of time  Sit to lying activity   Assist level: No help, No cues, assistive device, takes more than a reasonable amount of time  Lying to sitting activity   Assist level: No help, No cues, assistive device, takes more than a reasonable amount of time  Mobility details     Transfers Sit to stand transfer   Sit to stand assist level: No help, no cues, assistive device, takes more than a reasonable amount of time    Chair/bed transfer   Chair/bed transfer method: Ambulatory Chair/bed transfer assist level: No Help, no cues, assistive device, takes more than a reasonable amount of time Chair/bed transfer assistive device: Education administrator transfer assistive device: Publishing rights manager transfer assist level: Supervision or Lawyer device: Walker-rolling Max distance: 200 Assist level: Supervision or verbal cues  Walk 10 feet activity   Assist level: Supervision or verbal cues  Walk 50 feet with 2 turns activity   Assist level: Supervision or verbal cues  Walk 150 feet  activity   Assist level: Supervision or verbal cues  Walk 10 feet on uneven surfaces activity   Assist level: Supervision or verbal cues  Stairs   Stairs assistive device: 2 hand rails Max number of stairs: 12 Stairs assist level: Supervision or verbal cues  Walk up/down 1 step activity     Walk up/down 1 step (curb) assist level: Supervision or verbal cues  Walk up/down 4 steps activity   Walk up/down 4 steps assist level: Supervision or verbal cues  Walk up/down 12 steps activity   Walk up/down 12 steps assist level: Supervision or verbal cues  Pick up small objects from floor   Assist level: Touching or steadying assistance (Pt > 75%)  Wheelchair   Type: Manual Max wheelchair distance: 150 Assist Level: Supervision or verbal cues  Wheel 50 feet with 2 turns activity   Assist Level: Supervision or verbal cues  Wheel 150 feet activity   Assist Level: Supervision or verbal cues   Cognition Comprehension Comprehension assist level: Understands basic 75 - 89% of the time/ requires cueing 10 - 24% of the time  Expression Expression assist level: Expresses basic 90% of the time/requires cueing < 10% of the time.  Social Interaction Social Interaction assist level: Interacts appropriately 90% of the time - Needs monitoring or encouragement for participation or interaction.  Problem Solving Problem solving assist level: Solves basic 50 - 74% of the time/requires cueing 25 - 49% of the time  Memory Memory assist level: Recognizes or recalls 50 - 74% of the time/requires cueing 25 - 49% of the time    Giovonnie Trettel E Penven-Crew 04/16/2015, 12:18 PM

## 2015-04-16 NOTE — Progress Notes (Addendum)
Physical Therapy Session Note  Patient Details  Name: [name] MRN: [id number] Date of Birth: Jun 03, 1918  Today's Date: 04/16/2015 PT Individual Time: 1000-1100 PT Individual Time Calculation (min): 60 min   Short Term Goals: Week 2:  PT Short Term Goal 1 (Week 2): Pt will demonstrate supine<>sit and scooting in bed with supervision.  PT Short Term Goal 2 (Week 2): Pt will demonstrate sit<>stand from a variety of surfaces with mod I PT Short Term Goal 3 (Week 2): Pt will ambulate 120 feet with RW and min A PT Short Term Goal 4 (Week 2): Pt will propel w/c 150' with supervision  Skilled Therapeutic Interventions/Progress Updates:  Pt received in w/c with daughter, [name], present for family education.  Pt demonstrates sit<>stand transfers and car transfers with supervision and occasional verbal cues for hand placement. Pt ambulates x200 feet with RW and supervision with occasional cues for upright posture and step length. Pt negotiates 8 steps with 2 hand rails and supervision. Pt propels w/c x150 feet with supervision and occasional cues for steering, pt requires assist with leg rests.    PT provided family education for [place]. Reviewed patients current level of function, cues given for functional transfers, car transfers, gait, stair negotiation, and w/c propulsion/management. PT demonstrated to [facility] of w/c brakes, leg rests, collapsing and opening w/c for transport, etc. PT discussed pt's ability to get in/out of bed and transfer to chair mod I, amb safely within the home with supervision and RW, negotiate stairs with supervision. [name] asked appropriate questions about patients mobility needs, all questions answered.    Therapy Documentation Precautions:  Precautions Precautions: Fall Precaution Comments: Seizure precautions Restrictions Weight Bearing Restrictions: No  Pain: Pain Assessment Pain Assessment: No/denies pain Mobility: Bed Mobility Bed Mobility: Supine  to Sit;Sit to Supine Supine to Sit: 6: Modified independent (Device/Increase time) Sit to Supine: 6: Modified independent (Device/Increase time) Transfers Transfers: Yes Sit to Stand: 6: Modified independent (Device/Increase time) Stand to Sit: 6: Modified independent (Device/Increase time) Stand Pivot Transfers: 6: Modified independent (Device/Increase time) Locomotion : Ambulation Ambulation: Yes Ambulation/Gait Assistance: 5: Supervision Ambulation Distance (Feet): 200 Feet Assistive device: Rolling walker Ambulation/Gait Assistance Details: Verbal cues for safe use of DME/AE;Verbal cues for gait pattern Gait Gait: Yes Gait Pattern: Decreased step length - left;Decreased step length - right;Step-through pattern;Shuffle;Trunk flexed Stairs / Additional Locomotion Stairs: Yes Stairs Assistance: 5: Supervision Stair Management Technique: Two rails Number of Stairs: 12 Height of Stairs: 6 Wheelchair Mobility Wheelchair Mobility: Yes Wheelchair Assistance: 5: Careers information officer: Both upper extremities Distance: 150    Balance: Balance Balance Assessed: Yes Static Sitting Balance Static Sitting - Balance Support: Feet supported Static Sitting - Level of Assistance: 6: Modified independent (Device/Increase time) Dynamic Sitting Balance Dynamic Sitting - Balance Support: Feet supported Dynamic Sitting - Level of Assistance: 6: Modified independent (Device/Increase time) Dynamic Sitting - Balance Activities: Lateral lean/weight shifting;Forward lean/weight shifting;Reaching for objects;Reaching across midline;Trunk control activities Static Standing Balance Static Standing - Balance Support: During functional activity;Bilateral upper extremity supported Static Standing - Level of Assistance: 6: Modified independent (Device/Increase time) Dynamic Standing Balance Dynamic Standing - Balance Support: During functional activity;Left upper extremity supported;Right  upper extremity supported Dynamic Standing - Level of Assistance: 5: Stand by assistance Dynamic Standing - Balance Activities: Forward lean/weight shifting;Lateral lean/weight shifting;Reaching across midline    Function:  Toileting Toileting             Bed Mobility Roll left and right activity   Assist  level: No help, No cues, assistive device, takes more than a reasonable amount of time    Sit to lying activity   Assist level: No help, No cues, assistive device, takes more than a reasonable amount of time    Lying to sitting activity   Assist level: No help, No cues, assistive device, takes more than a reasonable amount of time    Mobility details     Transfers Sit to stand transfer   Sit to stand assist level: No help, no cues, assistive device, takes more than a reasonable amount of time    Chair/bed transfer   Chair/bed transfer method: Ambulatory Chair/bed transfer assist level: No Help, no cues, assistive device, takes more than a reasonable amount of time Chair/bed transfer assistive device: Designer, jewellery transfer assistive device: Publishing rights manager transfer assist level: Supervision or Lawyer device: Walker-rolling Max distance: 200 Assist level: Supervision or verbal cues  Walk 10 feet activity   Assist level: Supervision or verbal cues  Walk 50 feet with 2 turns activity   Assist level: Supervision or verbal cues  Walk 150 feet activity   Assist level: Supervision or verbal cues  Walk 10 feet on uneven surfaces activity   Assist level: Supervision or verbal cues  Stairs   Stairs assistive device: 2 hand rails Max number of stairs: 12 Stairs assist level: Supervision or verbal cues  Walk up/down 1 step activity     Walk up/down 1 step (curb) assist level: Supervision or verbal cues  Walk up/down 4 steps activity   Walk up/down 4 steps assist level:  Supervision or verbal cues  Walk up/down 12 steps activity   Walk up/down 12 steps assist level: Supervision or verbal cues  Pick up small objects from floor   Assist level: Touching or steadying assistance (Pt > 75%)  Wheelchair   Type: Manual Max wheelchair distance: 150 Assist Level: Supervision or verbal cues  Wheel 50 feet with 2 turns activity   Assist Level: Supervision or verbal cues  Wheel 150 feet activity   Assist Level: Supervision or verbal cues     Therapy/Group: Individual Therapy  Earnest Conroy Penven-Crew 04/16/2015, 2:39 PM

## 2015-04-16 NOTE — Progress Notes (Signed)
Occupational Therapy Session Note  Patient Details  Name: [name] MRN: [id number] Date of Birth: 05-26-1918  Today's Date: 04/16/2015 OT Individual Time: 1100-1200 OT Individual Time Calculation (min): 60 min    Short Term Goals: Week 2:  OT Short Term Goal 1 (Week 2): STG=LTGs secondary to ELOS  Skilled Therapeutic Interventions/Progress Updates:    Pt resting in w/c upon arrival.  Pt already dressed this morning.  Pt stated that someone "a lady" got her dressed.  Pt stated she didn't want to get undressed to complete bathing tasks and get dressed again.  Pt engaged in functional amb for home mgmt tasks, dynamic standing balance activities, continued orientation, and continued discharge planning.  Pt requires more than a reasonable amount of time to complete all tasks with multiple rest breaks.  Pt continues to require occasional verbal cues for RW safety.  Focus on activity tolerance, functional amb, functional transfers, and safety awareness.  Therapy Documentation Precautions:  Precautions Precautions: Fall Precaution Comments: Seizure precautions Restrictions Weight Bearing Restrictions: No  Pain: Pain Assessment Pain Assessment: No/denies pain   Therapy/Group: Individual Therapy  Leroy Libman 04/16/2015, 12:21 PM

## 2015-04-16 NOTE — Progress Notes (Signed)
Subjective/Complaints: Had a good night. Pain better overall. ROS: Pt denies fever, rash/itching, headache,  nausea, vomiting, abdominal pain, diarrhea,shortness of breath, palpitations, dysuria, dizziness,    Objective: Vital Signs: Blood pressure 189/62, pulse 97, temperature 98 F (36.7 C), temperature source Oral, resp. rate 17, height 5\' 3"  (1.6 m), weight 49.85 kg (109 lb 14.4 oz), SpO2 97 %. No results found. No results found for this or any previous visit (from the past 72 hour(s)).   HEENT: normal Cardio: IRRR and no murmur Resp: CTA B/L and unlabored GI: BS positive and NT, ND Extremity:  No Edema Skin:   Intact Neuro: Flat, Abnormal Sensory parathesia to LT in RLE and 4 to 4+/5 Motor BUE and BLE, oriented to person and place not time. Remembers me. Fair insight and awareness. Musc/Skel: Right knee tender to palpation without effusion, mild crepitus. Low back with minimal tenderness upon palpation. Gen NAD   Assessment/Plan: 1. Functional deficits secondary to  Left Temporal ICH due to hypertensive crisis which require 3+ hours per day of interdisciplinary therapy in a comprehensive inpatient rehab setting. Physiatrist is providing close team supervision and 24 hour management of active medical problems listed below. Physiatrist and rehab team continue to assess barriers to discharge/monitor patient progress toward functional and medical goals.  FIM: Function - Bathing Position: Shower Body parts bathed by patient: Right arm, Left arm, Chest, Abdomen, Front perineal area, Buttocks, Right upper leg, Left upper leg, Right lower leg, Left lower leg Body parts bathed by helper: Back Assist Level: Touching or steadying assistance(Pt > 75%)  Function- Upper Body Dressing/Undressing What is the patient wearing?: Bra, Pull over shirt/dress Bra - Perfomed by patient: Thread/unthread right bra strap, Thread/unthread left bra strap Bra - Perfomed by helper: Hook/unhook bra  (pull down sports bra) Pull over shirt/dress - Perfomed by patient: Thread/unthread right sleeve, Thread/unthread left sleeve, Put head through opening, Pull shirt over trunk Assist Level: Supervision or verbal cues Function - Lower Body Dressing/Undressing Lower body dressing/undressing activity did not occur: 2: Max-Patient completed 25-49% of tasks What is the patient wearing?: Underwear, Pants, Socks, Shoes, [employer] Position: Wheelchair/chair at [employer] - Performed by patient: Thread/unthread right underwear leg, Thread/unthread left underwear leg, Pull underwear up/down Underwear - Performed by helper: Thread/unthread left underwear leg Pants- Performed by patient: Thread/unthread right pants leg, Thread/unthread left pants leg, Pull pants up/down, Fasten/unfasten pants Non-skid slipper socks- Performed by patient: Don/doff right sock, Don/doff left sock Shoes - Performed by helper: Don/doff right shoe, Don/doff left shoe, Fasten right, Fasten left TED Hose - Performed by helper: Don/doff right TED hose, Don/doff left TED hose Assist Level: Touching or steadying assistance (Pt > 75%)  Function - Toileting Toileting steps completed by patient: Adjust clothing prior to toileting, Performs perineal hygiene, Adjust clothing after toileting Toileting steps completed by helper: Performs perineal hygiene, Adjust clothing after toileting Toileting Assistive Devices: Grab bar or rail Assist level: Touching or steadying assistance (Pt.75%)  Function - Air cabin crew transfer assistive device: Walker, Grab bar, Elevated toilet seat/BSC over toilet Assist level to toilet: Supervision or verbal cues Assist level from toilet: Supervision or verbal cues  Function - Chair/bed transfer Chair/bed transfer method: Ambulatory Chair/bed transfer assist level: Supervision or verbal cues Chair/bed transfer assistive device: Walker Chair/bed transfer details: Verbal cues for  precautions/safety  Function - Locomotion: Wheelchair Will patient use wheelchair at discharge?: Yes Type: Manual Max wheelchair distance: 200 Assist Level: Supervision or verbal cues Assist Level: Supervision or verbal cues  Assist Level: Supervision or verbal cues Function - Locomotion: Ambulation Assistive device: Walker-rolling Max distance: 200 Assist level: Supervision or verbal cues Assist level: Supervision or verbal cues Assist level: Supervision or verbal cues Assist level: Supervision or verbal cues Assist level: Touching or steadying assistance (Pt > 75%)  Function - Comprehension Comprehension: Auditory Comprehension assistive device: Other (HOH) Comprehension assist level: Understands basic 75 - 89% of the time/ requires cueing 10 - 24% of the time  Function - Expression Expression: Verbal Expression assist level: Expresses basic 90% of the time/requires cueing < 10% of the time.  Function - Social Interaction Social Interaction assist level: Interacts appropriately 90% of the time - Needs monitoring or encouragement for participation or interaction.  Function - Problem Solving Problem solving assist level: Solves basic 50 - 74% of the time/requires cueing 25 - 49% of the time  Function - Memory Memory assist level: Recognizes or recalls 50 - 74% of the time/requires cueing 25 - 49% of the time Patient normally able to recall (first 3 days only): Staff names and faces, That he or she is in a hospital  Medical Problem List and Plan: 1. Functional deficits secondary to right temporal intraparenchymal hemorrhage secondary to hypertensive crisis- encephalopathy related to [place], lethargy likely multifactorial including CVA effect    -given age/CVA she is unlikely to rapidly return to baseline cognitve level as expected by family    2. DVT Prophylaxis/Anticoagulation: Subcutaneous heparin to continue 3. Pain Management: Voltaren gel 4 times a day to right knee,  scheduled ice, no current knee pain  -it sounds as if she's had a recent knee injection within the last 4-6 weeks  - neoprene knee sleeve for right knee support has helped per patient  -would avoid even tramadol given cognition/arousal  5. Neuropsych: This patient is capable of making decisions on her own behalf. 6. Skin/Wound Care: Routine skin checks, no breakdown but at risk--- continue oob, nutritional efforts 7. Fluids/Electrolytes/Nutrition: Routine I&O, protein malnutrition  -eating 100% meals 8. Hypertension. Cozaar 100 mg daily. Monitor with increased mobility, currently under control, 142/81 this am 9. Hypokalemia. resolved 10.  Freq small BMs- reduced senna S to qd  LOS (Days) 18 A FACE TO FACE EVALUATION WAS PERFORMED  [name] 04/16/2015, 8:23 AM

## 2015-04-16 NOTE — Discharge Summary (Signed)
[name], [name] [name]               ACCOUNT NO.:  [id number]  MEDICAL RECORD NO.:  [id number]  LOCATION:  [id number]                        FACILITY:  [facility]  PHYSICIAN:  Lauraine Rinne, P.A.  DATE OF BIRTH:  Jan 02, 1918  DATE OF ADMISSION:  03/29/2015 DATE OF DISCHARGE:  04/17/2015                              DISCHARGE SUMMARY   DISCHARGE DIAGNOSES: 1. Functional deficits secondary to right temporal intraparenchymal     hemorrhage due to hypertensive crisis with encephalopathy. 2. Subcutaneous heparin for DVT prophylaxis. 3. Pain management. 4. Hypertension. 5. Decreased nutritional storage. 6. Hypokalemia, resolved.  HISTORY OF PRESENT ILLNESS:  This is a 81 year old right-handed female history of hypertension, independent prior to admission and still driving, who presented on March 19, 2015, with altered mental status, left-sided weakness and slurred speech.  Blood pressure in the ED 140/80.  CT of the head and imaging showed a large right temporal intraparenchymal hematoma.  Right to midline shift of 6 mm.  Troponin of 0.4 felt to be related to demand ischemia.  No further workup indicated. Neurosurgery consulted, advised conservative care.  Initially placed on Keppra for seizure prophylaxis.  Followup CT of the brain stable without hydrocephalus.  MRA of the head with diffuse small vessel disease.  EEG completely consistent with encephalopathy.  No seizure activity. Echocardiogram with ejection fraction of 70%.  No wall motion abnormalities.  Subcutaneous heparin for DVT prophylaxis added on 03/25/2015, maintained on a dysphagia.  Thin liquid diet.  Bouts of hypokalemia with potassium supplement added.  Physical and occupational therapy ongoing.  The patient was admitted for comprehensive rehab program.  PAST MEDICAL HISTORY:  See discharge diagnoses.  SOCIAL HISTORY:  Lives with family.  Independent prior to admission. Driving short distances.  Functional status upon admission  to rehab services was minimal assist to ambulate 10 feet rolling walker, +2 physical assist sit to stand, mod max assist activities of daily living.  PHYSICAL EXAMINATION:  VITAL SIGNS:  Blood pressure 95/52, pulse 84, temperature 98, respirations 28. GENERAL:  This was an alert female.  She was oriented to person, place, and time.  Limited awareness of her deficits.  She made good eye contact with examiner.  Followed simple commands. LUNGS:  Clear to auscultation. CARDIAC:  Regular rate and rhythm. ABDOMEN:  Soft and nontender.  Good bowel sounds.  REHABILITATION HOSPITAL COURSE:  The patient was admitted to inpatient rehab services with therapies initiated on a 3-hour daily basis consisting of physical therapy, occupational therapy, speech therapy, and rehabilitation nursing.  The following issues were addressed during the patient's rehabilitation stay.  Pertaining to [name] [name] right temporal intraparenchymal hemorrhage remained stable.  Close monitoring of blood pressure.  She would follow up with Neurology Services. Subcutaneous heparin for DVT prophylaxis.  No bleeding episodes.  Pain management.  She had some arthritic right knee complaints placed on Voltaren with good results.  She had knee injections in the past.  Blood pressures controlled on Cozaar.  She would follow up with her primary MD.  Bouts of hypokalemia had resolved.  Her diet was slowly advanced to a mechanical soft thin liquid.  The patient received weekly collaborative interdisciplinary team conferences to discuss estimated length  of stay, family teaching, and any barriers to discharge.  She was ambulating 200 feet, rolling walker supervision.  Propelling her wheelchair supervision on uneven surfaces and around obstacles. Strength and endurance continued to improve.  Negotiated 12 steps with handrail supervision.  Activities of daily living.  She could ambulate to the toilet and shower with a rolling walker.   She brushed her teeth at sink side.  She showed improved safety awareness to increase her overall independence.  She was still needing some moderate assist at times for problem solving and recall during mild complex task of organizing as per se a grocery list.  Family was to provide the necessary supervision that she needed at home as well as private sitters had been hired.  She was discharged to home with ongoing therapies.  DISCHARGE MEDICATIONS:  Voltaren gel 4 times daily to affected area, Cozaar 100 mg p.o. daily,  DIET:  Mechanical soft thin liquids.  SPECIAL INSTRUCTIONS.NO ASPIRIN FOR NOW   FOLLOWUP:  She would follow up with Dr. Tawni Levy at the outpatient rehab service office on June 12, 2015; Dr. Erlinda Hong, Neurology Service, to call for appointment; Dr. Orson Slick, Neurosurgery, call for appointment; Dr. Rushie Chestnut, Medical Management.     Lauraine Rinne, P.A.     DA/MEDQ  D:  04/16/2015  T:  04/16/2015  Job:  [id number]  cc:   Garret Reddish, MD

## 2015-04-16 NOTE — Discharge Summary (Signed)
Discharge summary job [phone]

## 2015-04-16 NOTE — Progress Notes (Signed)
Speech Language Pathology Daily Session Note  Patient Details  Name: [name] MRN: [id number] Date of Birth: 1917-12-27  Today's Date: 04/16/2015 SLP Individual Time: 1660-6004 SLP Individual Time Calculation (min): 45 min  Short Term Goals: Week 3: SLP Short Term Goal 1 (Week 3): Patient will demonstrate efficient mastication of regular textureswithout overt s/s of aspiration over 2 consecutive sessions with Min A verbal cues.  SLP Short Term Goal 2 (Week 3): Patient will consume current diet with minimal overt s/s of aspiration with Mod I for use of swallowing compensatory strategies.  SLP Short Term Goal 3 (Week 3): Patient will demonstrate functional problem solving for basic and familiar tasks with Min A verbal cues.  SLP Short Term Goal 4 (Week 3): Patient will sustain attention to a functional task for 10 minutes with Min A verbal cues.  SLP Short Term Goal 5 (Week 3): Patient will utilize external memory aids to increase orientation with supervision verbal and question cues.   Skilled Therapeutic Interventions: Skilled treatment session focused on dysphagia and cognitive goals. Upon arrival, patient was awake while sitting upright in the wheelchair. SLP facilitated session by providing skilled observation with trials of regular textures and thin liquids via straw. Patient demonstrated efficient mastication with minimal oral residue without overt s/s of aspiration, therefore, recommend patient upgrade to regular textures. Patient also reported it was her wedding anniversary and independently performed the math in order to recall how many years they have been together with 100% accuracy. Patient left upright in wheelchair with quick release belt in place and all needs within reach. Continue with current plan of care.    Function:  Eating Eating   Modified Consistency Diet: No Eating Assist Level: Supervision or verbal cues           Cognition Comprehension Comprehension  assist level: Understands basic 75 - 89% of the time/ requires cueing 10 - 24% of the time  Expression   Expression assist level: Expresses basic 90% of the time/requires cueing < 10% of the time.  Social Interaction Social Interaction assist level: Interacts appropriately 90% of the time - Needs monitoring or encouragement for participation or interaction.  Problem Solving Problem solving assist level: Solves basic 50 - 74% of the time/requires cueing 25 - 49% of the time  Memory Memory assist level: Recognizes or recalls 50 - 74% of the time/requires cueing 25 - 49% of the time    Pain Pain Assessment Pain Assessment: No/denies pain  Therapy/Group: Individual Therapy  Kameran Lallier 04/16/2015, 9:39 AM

## 2015-04-17 ENCOUNTER — Telehealth: Payer: Self-pay | Admitting: Family Medicine

## 2015-04-17 ENCOUNTER — Encounter (HOSPITAL_COMMUNITY): Payer: Self-pay | Admitting: Speech Pathology

## 2015-04-17 ENCOUNTER — Ambulatory Visit (HOSPITAL_COMMUNITY): Payer: Self-pay | Admitting: Physical Therapy

## 2015-04-17 ENCOUNTER — Inpatient Hospital Stay (HOSPITAL_COMMUNITY): Payer: Medicare Other | Admitting: Occupational Therapy

## 2015-04-17 MED ORDER — LOSARTAN POTASSIUM 100 MG PO TABS: 100.0000 mg | ORAL_TABLET | Freq: Every day | ORAL | Status: DC

## 2015-04-17 MED ORDER — DICLOFENAC SODIUM 1 % TD GEL: 2.0000 g | Freq: Four times a day (QID) | TRANSDERMAL | Status: DC

## 2015-04-17 NOTE — Telephone Encounter (Signed)
See below

## 2015-04-17 NOTE — Patient Care Conference (Signed)
Inpatient RehabilitationTeam Conference and Plan of Care Update Date: 04/16/2015   Time: 2:30 PM    Patient Name: [name]      Medical Record Number: [id number]  Date of Birth: [birth year] Sex: Female         Room/Bed: 4W21C/4W21C-01 Payor Info: Payor: [employer] MEDICARE / Plan: [employer] MEDICARE / Product Type: *No Product type* /    Admitting Diagnosis: R T P ICH  Admit Date/Time:  03/29/2015  2:37 PM Admission Comments: No comment available   Primary Diagnosis:  <principal problem not specified> Principal Problem: <principal problem not specified>  Patient Active Problem List   Diagnosis Date Noted  . Atrial fibrillation 03/29/2015  . UTI (urinary tract infection) 03/28/2015  . Altered mental status   . Lethargy   . Leukocytosis   . ICH (intracerebral hemorrhage)   . Seizures   . Brain bleed   . Cerebral hemorrhage   . Cytotoxic brain edema 03/22/2015  . Brain herniation 03/22/2015  . Cerebral parenchymal hemorrhage 03/19/2015  . Dizzy spells 11/10/2014  . SPINAL STENOSIS, CERVICAL 06/11/2010  . Essential hypertension 06/22/2006  . Arthritis of right knee 06/22/2006  . Osteopenia 06/22/2006    Expected Discharge Date: Expected Discharge Date: 04/17/15  Team Members Present: Physician leading conference: Dr. Alger Simons Social Worker Present: Lennart Pall, LCSW Nurse Present: Other (comment) Verdis Frederickson, RN) PT Present: Other (comment);Raylene Everts, PT;Mandy Mentone, PT ([place], [place]) OT Present: Roanna Epley, COTA;Jennifer Tamala Julian, OT SLP Present: Weston Anna, SLP PPS Coordinator present : Daiva Nakayama, RN, CRRN     Current Status/Progress Goal Weekly Team Focus  Medical   improved arousal. right knee pain mild. intake improved. bp generally controlled  see prior, finalize dc planning  arousal, pain   Bowel/Bladder   Continent of bowel and bladder. LBM 04/15/15  Pt to remain continent of bowel and bladder  Monitor   Swallow/Nutrition/  Hydration   Regular textures with thin liquids, supervision   Supervision with least restrictive diet  Family education    ADL's   overall supervision; more than reasonable amount of time; continued impaired orientation to time of day  supervision overall  activity tolerance, family education, safety awareness   Mobility   supervision all mobility, steady A on stairs  supervision all mobility  grad day Tues, d/c Wed   Communication             Safety/Cognition/ Behavioral Observations  Mod-Max A   Mod A   Family Education    Pain   Voltaren gel to R knee for knee discomfort  <3  Monitor for nonverbal cues of pain   Skin   CDI  CDI  Assist to repostiion q 2hrs    Rehab Goals Patient on target to meet rehab goals: Yes *See Care Plan and progress notes for long and short-term goals.  Barriers to Discharge: consistent safety awareness    Possible Resolutions to Barriers:  supervision/caregiver education    Discharge Planning/Teaching Needs:  home with family able to provide 24/7 support       Team Discussion:  Ready for d/c tomorrow.  Using sleeve support for knee.  Pt with general frustrations but improved overall.  Diet upgraded to reg.  Completing family ed tomorrow prior to d/c.  Revisions to Treatment Plan:  None   Continued Need for Acute Rehabilitation Level of Care: The patient requires daily medical management by a physician with specialized training in physical medicine and rehabilitation for the following conditions:  Daily direction of a multidisciplinary physical rehabilitation program to ensure safe treatment while eliciting the highest outcome that is of practical value to the patient.: Yes Daily medical management of patient stability for increased activity during participation in an intensive rehabilitation regime.: Yes Daily analysis of laboratory values and/or radiology reports with any subsequent need for medication adjustment of medical intervention for :  Neurological problems;Cardiac problems  [name] 04/17/2015, 11:45 AM

## 2015-04-17 NOTE — Progress Notes (Signed)
Speech Language Pathology Session Note & Discharge Summary  Patient Details  Name: [name] MRN: [id number] Date of Birth: 1918/05/08  Today's Date: 04/17/2015 SLP Individual Time: 1000-1025 SLP Individual Time Calculation (min): 25 min   Skilled Therapeutic Interventions:  Skilled treatment session focused on completion of patient and family education. SLP facilitated session by providing education to the patient's daughter in regards to the patient's current cognitive function and strategies to utilize at home to maximize recall and overall safety. Also educated on the importance of patient having 24 hour supervision. Patient's daughter also educated on patient's current swallowing function, diet recommendations and appropriate textures. Handouts were also given to reinforce information. Patient will discharge home today with family.   Patient has met 7 of 7 long term goals.  Patient to discharge at overall Mod level.   Reasons goals not met: N/A    Clinical Impression/Discharge Summary: Patient has made functional gains and has met 7 of 7 LTG's this admission due to increased swallowing and cognitive function. Currently, patient is consuming regular textures with thin liquids with without overt s/s of aspiration and requires intermittent supervision verbal cues for use of swallowing compensatory strategies. Patient also requires overall Mod A multimodal cues for recall of new and functional information, functional problem solving with basic and familiar tasks and emergent awareness and Min A multimodal cues selective attention in a mildly distracting environment.  Patient and family education is complete and patient will discharge home with 24 hour supervision. Patient would benefit from f/u skilled SLP intervention to maximize cognitive function and overall functional independence to reduce caregiver burden.   Care Partner:  Caregiver Able to Provide Assistance: Yes  Type of Caregiver  Assistance: Physical;Cognitive  Recommendation:  Home Health SLP;24 hour supervision/assistance  Rationale for SLP Follow Up: Maximize cognitive function and independence;Reduce caregiver burden   Equipment: N/A    Reasons for discharge: Treatment goals met;Discharged from hospital   Patient/Family Agrees with Progress Made and Goals Achieved: Yes   Function:  Eating Eating                 Cognition Comprehension Comprehension assist level: Understands basic 90% of the time/cues < 10% of the time  Expression   Expression assist level: Expresses basic 90% of the time/requires cueing < 10% of the time.  Social Interaction Social Interaction assist level: Interacts appropriately 90% of the time - Needs monitoring or encouragement for participation or interaction.  Problem Solving Problem solving assist level: Solves basic 50 - 74% of the time/requires cueing 25 - 49% of the time  Memory Memory assist level: Recognizes or recalls 50 - 74% of the time/requires cueing 25 - 49% of the time   Alan Drummer 04/17/2015, 1:18 PM

## 2015-04-17 NOTE — Discharge Instructions (Signed)
Inpatient Rehab Discharge Instructions  [name] Discharge date and time: No discharge date for patient encounter.   Activities/Precautions/ Functional Status: Activity: activity as tolerated Diet:  Wound Care: keep wound clean and dry Functional status:  ___ No restrictions     ___ Walk up steps independently ___ 24/7 supervision/assistance   ___ Walk up steps with assistance ___ Intermittent supervision/assistance  ___ Bathe/dress independently ___ Walk with walker     ___ Bathe/dress with assistance ___ Walk Independently    ___ Shower independently _x STROKE/TIA DISCHARGE INSTRUCTIONS SMOKING Cigarette smoking nearly doubles your risk of having a stroke & is the single most alterable risk factor  If you smoke or have smoked in the last 12 months, you are advised to quit smoking for your health.  Most of the excess cardiovascular risk related to smoking disappears within a year of stopping.  Ask you doctor about anti-smoking medications  [place] Quit Line: 1-800-QUIT NOW  Free Smoking Cessation Classes [phone]  CHOLESTEROL Know your levels; limit fat & cholesterol in your diet  Lipid Panel     Component Value Date/Time   CHOL 132 03/25/2015 0425   TRIG 95 03/25/2015 0425   HDL 38* 03/25/2015 0425   CHOLHDL 3.5 03/25/2015 0425   VLDL 19 03/25/2015 0425   [place] 75 03/25/2015 0425      Many patients benefit from treatment even if their cholesterol is at goal.  Goal: Total Cholesterol (CHOL) less than 160  Goal:  Triglycerides (TRIG) less than 150  Goal:  HDL greater than 40  Goal:  LDL (LDLCALC) less than 100   BLOOD PRESSURE American Stroke Association blood pressure target is less that 120/80 mm/Hg  Your discharge blood pressure is:  BP: 111/66 mmHg  Monitor your blood pressure  Limit your salt and alcohol intake  Many individuals will require more than one medication for high blood pressure  DIABETES (A1c is a blood sugar average for last 3 months)  Goal HGBA1c is under 7% (HBGA1c is blood sugar average for last 3 months)  Diabetes: No known diagnosis of diabetes    Lab Results  Component Value Date   HGBA1C 5.8* 03/25/2015     Your HGBA1c can be lowered with medications, healthy diet, and exercise.  Check your blood sugar as directed by your physician  Call your physician if you experience unexplained or low blood sugars.  PHYSICAL ACTIVITY/REHABILITATION Goal is 30 minutes at least 4 days per week  Activity: Increase activity slowly, Therapies: Physical Therapy: Home Health Return to work:   Activity decreases your risk of heart attack and stroke and makes your heart stronger.  It helps control your weight and blood pressure; helps you relax and can improve your mood.  Participate in a regular exercise program.  Talk with your doctor about the best form of exercise for you (dancing, walking, swimming, cycling).  DIET/WEIGHT Goal is to maintain a healthy weight  Your discharge diet is: DIET DYS 2 Room service appropriate?: Yes; Fluid consistency:: Thin  liquids Your height is:  Height: 5\' 3"  (160 cm) Your current weight is: Weight: 71.668 kg (158 lb) Your Body Mass Index (BMI) is:  BMI (Calculated): 28  Following the type of diet specifically designed for you will help prevent another stroke.  Your goal weight range is:    Your goal Body Mass Index (BMI) is 19-24.  Healthy food habits can help reduce 3 risk factors for stroke:  High cholesterol, hypertension, and excess weight.  RESOURCES Stroke/Support Group:  Call [phone]   STROKE EDUCATION PROVIDED/REVIEWED AND GIVEN TO PATIENT Stroke warning signs and symptoms How to activate emergency medical system (call 911). Medications prescribed at discharge. Need for follow-up after discharge. Personal risk factors for stroke. Pneumonia vaccine given:  Flu vaccine given:  My questions have been answered, the writing is legible, and I understand these instructions.  I  will adhere to these goals & educational materials that have been provided to me after my discharge from the hospital.    __ Walk with assistance    ___ Shower with assistance ___ No alcohol     ___ Return to work/school ________    COMMUNITY REFERRALS UPON DISCHARGE:    Home Health:   PT     OT     ST                     Agency:  [facility]    Phone: [phone]  Medical Equipment/Items Ordered: wheelchair, cushion, walker                                                     Agency/Supplier:  [employer] @ [phone]      Special Instructions: NO ASPIRIN   My questions have been answered and I understand these instructions. I will adhere to these goals and the provided educational materials after my discharge from the hospital.  Patient/Caregiver Signature _______________________________ Date __________  Clinician Signature _______________________________________ Date __________  Please bring this form and your medication list with you to all your follow-up doctor's appointments.

## 2015-04-17 NOTE — Progress Notes (Signed)
Social Work  Discharge Note  The overall goal for the admission was met for:   Discharge location: Yes - returning home with daughter, son-in-law and pt's husband  Length of Stay: Yes - 21 days  Discharge activity level: Yes - supervision/ min assist overall  Home/community participation: Yes  Services provided included: MD, RD, PT, OT, SLP, RN, TR, Pharmacy, Neuropsych and SW  Financial Services: Medicare  Follow-up services arranged: Home Health: PT, OT, ST via [facility], DME: 18x16 lightweight w/c with cushion, youth rolling walker via [facility] and Patient/Family has no preference for HH/DME agencies  Comments (or additional information):  Patient/Family verbalized understanding of follow-up arrangements: Yes  Individual responsible for coordination of the follow-up plan: pt and daughter  Confirmed correct DME delivered: ,  04/17/2015    ,  

## 2015-04-17 NOTE — Progress Notes (Signed)
Subjective/Complaints: Feeling well. Excited to get home. Pleased with progress and thankful for team's efforts. ROS: Pt denies fever, rash/itching, headache,  nausea, vomiting, abdominal pain, diarrhea,shortness of breath, palpitations, dysuria, dizziness,    Objective: Vital Signs: Blood pressure 126/61, pulse 60, temperature 97.8 F (36.6 C), temperature source Oral, resp. rate 16, height '5\' 3"'  (1.6 m), weight 49.85 kg (109 lb 14.4 oz), SpO2 98 %. No results found. No results found for this or any previous visit (from the past 72 hour(s)).   HEENT: normal Cardio: IRRR and no murmur Resp: CTA B/L and unlabored GI: BS positive and NT, ND Extremity:  No Edema Skin:   Intact Neuro: Flat, Abnormal Sensory parathesia to LT in RLE and 4 to 4+/5 Motor BUE and BLE, oriented to person and place not time. Remembers me. Fair insight and awareness. Musc/Skel: Right knee tender to palpation without effusion, mild crepitus. Low back with minimal tenderness upon palpation. Gen NAD   Assessment/Plan: 1. Functional deficits secondary to  Left Temporal ICH due to hypertensive crisis which require 3+ hours per day of interdisciplinary therapy in a comprehensive inpatient rehab setting. Physiatrist is providing close team supervision and 24 hour management of active medical problems listed below. Physiatrist and rehab team continue to assess barriers to discharge/monitor patient progress toward functional and medical goals.  FIM: Function - Bathing Position: Shower Body parts bathed by patient: Right arm, Left arm, Chest, Abdomen, Front perineal area, Buttocks, Right upper leg, Left upper leg, Right lower leg, Left lower leg Body parts bathed by helper: Back Assist Level: Touching or steadying assistance(Pt > 75%)  Function- Upper Body Dressing/Undressing What is the patient wearing?: Bra, Pull over shirt/dress Bra - Perfomed by patient: Thread/unthread right bra strap, Thread/unthread left  bra strap Bra - Perfomed by helper: Hook/unhook bra (pull down sports bra) Pull over shirt/dress - Perfomed by patient: Thread/unthread right sleeve, Thread/unthread left sleeve, Put head through opening, Pull shirt over trunk Assist Level: Supervision or verbal cues Function - Lower Body Dressing/Undressing Lower body dressing/undressing activity did not occur: 2: Max-Patient completed 25-49% of tasks What is the patient wearing?: Underwear, Pants, Socks, Shoes, [employer] Position: Wheelchair/chair at [employer] - Performed by patient: Thread/unthread right underwear leg, Thread/unthread left underwear leg, Pull underwear up/down Underwear - Performed by helper: Thread/unthread left underwear leg Pants- Performed by patient: Thread/unthread right pants leg, Thread/unthread left pants leg, Pull pants up/down, Fasten/unfasten pants Non-skid slipper socks- Performed by patient: Don/doff right sock, Don/doff left sock Shoes - Performed by helper: Don/doff right shoe, Don/doff left shoe, Fasten right, Fasten left TED Hose - Performed by helper: Don/doff right TED hose, Don/doff left TED hose Assist Level: Touching or steadying assistance (Pt > 75%)  Function - Toileting Toileting steps completed by patient: Adjust clothing prior to toileting, Performs perineal hygiene, Adjust clothing after toileting Toileting steps completed by helper: Performs perineal hygiene, Adjust clothing after toileting Toileting Assistive Devices: Grab bar or rail Assist level: Touching or steadying assistance (Pt.75%)  Function - Air cabin crew transfer assistive device: Walker, Grab bar, Elevated toilet seat/BSC over toilet Assist level to toilet: Supervision or verbal cues Assist level from toilet: Supervision or verbal cues  Function - Chair/bed transfer Chair/bed transfer method: Ambulatory Chair/bed transfer assist level: No Help, no cues, assistive device, takes more than a reasonable amount of  time Chair/bed transfer assistive device: Walker Chair/bed transfer details: Verbal cues for precautions/safety  Function - Locomotion: Wheelchair Will patient use wheelchair at discharge?: Yes Type:  Manual Max wheelchair distance: 150 Assist Level: Supervision or verbal cues Assist Level: Supervision or verbal cues Assist Level: Supervision or verbal cues Function - Locomotion: Ambulation Assistive device: Walker-rolling Max distance: 200 Assist level: Supervision or verbal cues Assist level: Supervision or verbal cues Assist level: Supervision or verbal cues Assist level: Supervision or verbal cues Assist level: Supervision or verbal cues  Function - Comprehension Comprehension: Auditory Comprehension assistive device: Other (HOH) Comprehension assist level: Understands basic 75 - 89% of the time/ requires cueing 10 - 24% of the time  Function - Expression Expression: Verbal Expression assist level: Expresses basic 90% of the time/requires cueing < 10% of the time.  Function - Social Interaction Social Interaction assist level: Interacts appropriately 90% of the time - Needs monitoring or encouragement for participation or interaction.  Function - Problem Solving Problem solving assist level: Solves basic 50 - 74% of the time/requires cueing 25 - 49% of the time  Function - Memory Memory assist level: Recognizes or recalls 50 - 74% of the time/requires cueing 25 - 49% of the time Patient normally able to recall (first 3 days only): Staff names and faces, That he or she is in a hospital  Medical Problem List and Plan: 1. Functional deficits secondary to right temporal intraparenchymal hemorrhage secondary to hypertensive crisis- encephalopathy related to [place], lethargy likely multifactorial including CVA effect    -home today with family. Goals met    2. DVT Prophylaxis/Anticoagulation: Subcutaneous heparin to continue 3. Pain Management: Voltaren gel 4 times a day to  right knee, scheduled ice, no current knee pain  -it sounds as if she's had a recent knee injection within the last 4-6 weeks  - neoprene knee sleeve for right knee support has helped per patient  -would avoid narcs given cognition/arousal  5. Neuropsych: This patient is capable of making decisions on her own behalf. 6. Skin/Wound Care: Routine skin checks, no breakdown but at risk--- continue oob, nutritional efforts 7. Fluids/Electrolytes/Nutrition: Routine I&O, protein malnutrition  -eating 100% meals 8. Hypertension. Cozaar 100 mg daily. Monitor with increased mobility, currently under control, 142/81 this am 9. Hypokalemia. resolved 10.  Freq small BMs- reduced senna S to qd  LOS (Days) 19 A FACE TO FACE EVALUATION WAS PERFORMED  Colden Samaras T 04/17/2015, 8:28 AM

## 2015-04-17 NOTE — Progress Notes (Signed)
Pt discharged to home with daughter.  Discharge instructions given by Marlowe Shores, PA to daughter and pt with daughter having verbal understanding.

## 2015-04-17 NOTE — Progress Notes (Signed)
Occupational Therapy Discharge Summary  Patient Details  Name: [name] MRN: [id number] Date of Birth: 1917-09-02  Today's Date: 04/17/2015 OT Individual Time: 3734-2876 OT Individual Time Calculation (min): 45 min    Patient has met 7 of 7 long term goals due to improved activity tolerance, improved balance, postural control, ability to compensate for deficits, improved awareness and improved coordination.  Patient to discharge at overall supervision level with the exception of min A for shower transfers.  Patient's care partner is independent to provide the necessary physical and cognitive assistance at discharge.    Reasons goals not met: all goals met  Recommendation:  Patient will benefit from ongoing skilled OT services in home health setting to continue to advance functional skills in the area of BADL and iADL.  Equipment: No equipment provided  Reasons for discharge: treatment goals met and discharge from hospital  Patient/family agrees with progress made and goals achieved: Yes   OT Intervention: Upon entering the room, pt seated in wheelchair and fully dressed with NT present in the room. NTreports providing supervision and min cues for safety for self care prior to therapist arrival. Her daughter, [name], present for family education this session. OT education caregiver on pt progress towards goals and safety concerns. OT recommended use of baby monitor at home for night time in order to increase safety. Caregiver demonstrated proper assist for for toilet transfer and simulated shower transfer with use of DME appropriately. OT provided paper handouts on energy conservation for self care and IADL tasks. Caregiver and pt have no further questions. Pt remained in wheelchair with daughter present in room.   OT Discharge Precautions/Restrictions  Precautions Precautions: Fall Restrictions Weight Bearing Restrictions: No Pain Pain Assessment Pain Assessment: No/denies  pain Vision/Perception  Vision- History Baseline Vision/History: Wears glasses Wears Glasses: Reading only Patient Visual Report: No change from baseline  Cognition Overall Cognitive Status: Impaired/Different from baseline Arousal/Alertness: Awake/alert Orientation Level: Oriented X4 Sensation Sensation Light Touch: Appears Intact Stereognosis: Not tested Hot/Cold: Appears Intact Proprioception: Appears Intact Coordination Gross Motor Movements are Fluid and Coordinated: Yes Fine Motor Movements are Fluid and Coordinated: No Motor  Motor Motor: Within Functional Limits Motor - Discharge Observations: generalized weakness Mobility  Bed Mobility Bed Mobility: Supine to Sit;Sit to Supine Supine to Sit: 6: Modified independent (Device/Increase time) Sit to Supine: 6: Modified independent (Device/Increase time) Transfers Sit to Stand: 5: Supervision Stand to Sit: 5: Supervision  Trunk/Postural Assessment  Cervical Assessment Cervical Assessment: Exceptions to [facility] Thoracic Assessment Thoracic Assessment: Exceptions to [facility] Lumbar Assessment Lumbar Assessment: Exceptions to [facility]  Balance Balance Balance Assessed: Yes Static Sitting Balance Static Sitting - Balance Support: Feet supported Static Sitting - Level of Assistance: 6: Modified independent (Device/Increase time) Dynamic Sitting Balance Dynamic Sitting - Balance Support: Feet supported Dynamic Sitting - Level of Assistance: 6: Modified independent (Device/Increase time) Static Standing Balance Static Standing - Balance Support: During functional activity;Bilateral upper extremity supported Static Standing - Level of Assistance: 5: Stand by assistance Dynamic Standing Balance Dynamic Standing - Balance Support: During functional activity;Left upper extremity supported;Right upper extremity supported Dynamic Standing - Level of Assistance: 5: Stand by assistance Extremity/Trunk Assessment RUE Assessment RUE  Assessment: Exceptions to [facility] -  RUE AROM (degrees) Overall AROM Right Upper Extremity: Deficits RUE Overall AROM Comments: Grossly WFL except shoulder abd/flex to 90 degrees LUE Assessment LUE Assessment: Exceptions to WFL LUE AROM (degrees) LUE Overall AROM Comments: Grossly WFL except shoulder abd/flex to 90 degrees  Function:   Eating Eating  Grooming Oral Care,Brush Teeth, Clean Dentures Activity:    Oral care, brush teeth, clean dentures activity did not occur:  (all grooming performed with NT who reports tasks completed at supervision level) Assist Level: Supervision or verbal cues      Wash, Rinse, Dry Face Activity   Assist Level: Supervision or verbal cues      Wash, Rinse, Dry Hands Activity   Assist Level: Supervision or verbal cues      Brush, Comb Hair Activity   Assist Level: Supervision or verbal cues    Shave Activity Shave activity did not occur: Patient does not shave        Apply Makeup Activity Apply makeup activity did not occur: Patient does not wear makeup                                                           Bathing Bathing position   Position: Wheelchair/chair at sink  Bathing parts Body parts bathed by patient: Right arm;Left arm;Chest;Abdomen;Front perineal area;Buttocks;Right upper leg;Left upper leg;Right lower leg;Left lower leg Body parts bathed by helper: Back  Bathing assist Assist Level: Supervision or verbal cues (per NT report)       Upper Body Dressing/Undressing Upper body dressing   What is the patient wearing?: Bra;Pull over shirt/dress Bra - Perfomed by patient: Thread/unthread right bra strap;Thread/unthread left bra strap;Hook/unhook bra (pull down sports bra)   Pull over shirt/dress - Perfomed by patient: Thread/unthread right sleeve;Thread/unthread left sleeve;Put head through opening;Pull shirt over trunk          Upper body assist Assist Level: Supervision or verbal cues (per NT report)        Lower Body Dressing/Undressing Lower body dressing   What is the patient wearing?: Underwear;Pants;Socks;Shoes;[facility] Underwear - Performed by patient: Thread/unthread right underwear leg;Thread/unthread left underwear leg;Pull underwear up/down   Pants- Performed by patient: Thread/unthread right pants leg;Thread/unthread left pants leg;Pull pants up/down;Fasten/unfasten pants   Non-skid slipper socks- Performed by patient: Don/doff right sock;Don/doff left sock       Shoes - Performed by patient: Don/doff right shoe;Don/doff left shoe;Fasten right;Fasten left         TED Hose - Performed by helper: Don/doff right TED hose;Don/doff left TED hose  Lower body assist Assist Level: Supervision or verbal cues (per NT report)   Set up : Don/doff TED stockings   Toileting Toileting   Toileting steps completed by patient: Adjust clothing prior to toileting;Performs perineal hygiene;Adjust clothing after toileting      Toileting assist Assist level: Supervision or verbal cues    Bed Mobility Roll left and right activity   Assist level:  (pt seated in wheelchair upon entering the room. )  Sit to lying activity      Lying to sitting activity      Mobility details     Transfers Sit to stand transfer   Sit to stand assist level: Supervision or verbal cues Sit to stand assistive device: Armrests;Walker  Chair/bed Press photographer transfer assistive device: Walker;Elevated toilet seat/BSC over toilet       Assist level to toilet: Supervision or verbal cues Assist level from toilet: Supervision or verbal cues  Tub/shower transfer Tub/shower transfer activity  did not occur: Simulated Tub/shower assistive device: Shower chair;Walk in shower;Grab bars   Assist level into tub: Touching or steadying assistance (Pt > 75%/lift 1 leg) Assist level out of tub: Touching or steadying assistance (Pt > 75%)   Cognition Comprehension Comprehension  assist level: Understands basic 75 - 89% of the time/ requires cueing 10 - 24% of the time  Expression Expression assist level: Expresses basic 90% of the time/requires cueing < 10% of the time.  Social Interaction Social Interaction assist level: Interacts appropriately 90% of the time - Needs monitoring or encouragement for participation or interaction.  Problem Solving Problem solving assist level: Solves basic 50 - 74% of the time/requires cueing 25 - 49% of the time  Memory Memory assist level: Recognizes or recalls 50 - 74% of the time/requires cueing 25 - 49% of the time    Phineas Semen 04/17/2015, 10:08 AM

## 2015-04-17 NOTE — Telephone Encounter (Signed)
Pt being dc'd from hospital today and needs fu 1-2 weeks. Only same day appt. Is it ok to work in and do you have any preference which day? Thanks!

## 2015-04-17 NOTE — Plan of Care (Signed)
Problem: RH KNOWLEDGE DEFICIT Goal: RH STG INCREASE KNOWLEDGE OF DYSPHAGIA/FLUID INTAKE Patient verbalizes understanding of consistency, and textures of food. Patient able to demonstrate understanding of swallowing precautions, and techniques implemented by speech therapist supervision.  Outcome: Completed/Met Date Met:  04/17/15 Pt on regular diet at d/c

## 2015-04-18 ENCOUNTER — Telehealth: Payer: Self-pay | Admitting: *Deleted

## 2015-04-18 NOTE — Telephone Encounter (Signed)
Pt has been scheduled on 8/30 at 11:30. Called Lorre Nick the social worker at [facility] to inform pt of appt.

## 2015-04-18 NOTE — Telephone Encounter (Signed)
Left message on machine for patient to return our call - Transitional Care Management

## 2015-04-18 NOTE — Telephone Encounter (Signed)
Can use 11:30 on the 30th. If doesn't work-I am flexible and can use other SDA.

## 2015-04-19 DIAGNOSIS — G40909 Epilepsy, unspecified, not intractable, without status epilepticus: Secondary | ICD-10-CM | POA: Diagnosis not present

## 2015-04-19 DIAGNOSIS — D72829 Elevated white blood cell count, unspecified: Secondary | ICD-10-CM | POA: Diagnosis not present

## 2015-04-19 DIAGNOSIS — I69354 Hemiplegia and hemiparesis following cerebral infarction affecting left non-dominant side: Secondary | ICD-10-CM | POA: Diagnosis not present

## 2015-04-19 DIAGNOSIS — M4806 Spinal stenosis, lumbar region: Secondary | ICD-10-CM | POA: Diagnosis not present

## 2015-04-19 DIAGNOSIS — I4891 Unspecified atrial fibrillation: Secondary | ICD-10-CM | POA: Diagnosis not present

## 2015-04-19 DIAGNOSIS — I69322 Dysarthria following cerebral infarction: Secondary | ICD-10-CM | POA: Diagnosis not present

## 2015-04-19 DIAGNOSIS — I10 Essential (primary) hypertension: Secondary | ICD-10-CM | POA: Diagnosis not present

## 2015-04-19 NOTE — Telephone Encounter (Signed)
2nd attempt to call patient.  Home number busy and unable to leave a message on cell number.  Transitional care management.

## 2015-04-21 DIAGNOSIS — G40909 Epilepsy, unspecified, not intractable, without status epilepticus: Secondary | ICD-10-CM | POA: Diagnosis not present

## 2015-04-21 DIAGNOSIS — I10 Essential (primary) hypertension: Secondary | ICD-10-CM | POA: Diagnosis not present

## 2015-04-21 DIAGNOSIS — I4891 Unspecified atrial fibrillation: Secondary | ICD-10-CM | POA: Diagnosis not present

## 2015-04-21 DIAGNOSIS — D72829 Elevated white blood cell count, unspecified: Secondary | ICD-10-CM | POA: Diagnosis not present

## 2015-04-21 DIAGNOSIS — M4806 Spinal stenosis, lumbar region: Secondary | ICD-10-CM | POA: Diagnosis not present

## 2015-04-21 DIAGNOSIS — I69354 Hemiplegia and hemiparesis following cerebral infarction affecting left non-dominant side: Secondary | ICD-10-CM | POA: Diagnosis not present

## 2015-04-21 DIAGNOSIS — I69322 Dysarthria following cerebral infarction: Secondary | ICD-10-CM | POA: Diagnosis not present

## 2015-04-23 ENCOUNTER — Encounter: Payer: Self-pay | Admitting: Family Medicine

## 2015-04-23 ENCOUNTER — Ambulatory Visit (INDEPENDENT_AMBULATORY_CARE_PROVIDER_SITE_OTHER): Payer: Medicare Other | Admitting: Family Medicine

## 2015-04-23 VITALS — BP 122/80 | HR 73 | Temp 98.3°F | Wt 114.0 lb

## 2015-04-23 DIAGNOSIS — G935 Compression of brain: Secondary | ICD-10-CM

## 2015-04-23 DIAGNOSIS — M1711 Unilateral primary osteoarthritis, right knee: Secondary | ICD-10-CM

## 2015-04-23 DIAGNOSIS — I10 Essential (primary) hypertension: Secondary | ICD-10-CM | POA: Diagnosis not present

## 2015-04-23 DIAGNOSIS — I4891 Unspecified atrial fibrillation: Secondary | ICD-10-CM | POA: Diagnosis not present

## 2015-04-23 DIAGNOSIS — M4806 Spinal stenosis, lumbar region: Secondary | ICD-10-CM | POA: Diagnosis not present

## 2015-04-23 DIAGNOSIS — D72829 Elevated white blood cell count, unspecified: Secondary | ICD-10-CM | POA: Diagnosis not present

## 2015-04-23 DIAGNOSIS — I69354 Hemiplegia and hemiparesis following cerebral infarction affecting left non-dominant side: Secondary | ICD-10-CM | POA: Diagnosis not present

## 2015-04-23 DIAGNOSIS — I610 Nontraumatic intracerebral hemorrhage in hemisphere, subcortical: Secondary | ICD-10-CM | POA: Diagnosis not present

## 2015-04-23 DIAGNOSIS — G9340 Encephalopathy, unspecified: Secondary | ICD-10-CM | POA: Diagnosis not present

## 2015-04-23 DIAGNOSIS — I69322 Dysarthria following cerebral infarction: Secondary | ICD-10-CM | POA: Diagnosis not present

## 2015-04-23 DIAGNOSIS — M129 Arthropathy, unspecified: Secondary | ICD-10-CM

## 2015-04-23 DIAGNOSIS — N3000 Acute cystitis without hematuria: Secondary | ICD-10-CM

## 2015-04-23 DIAGNOSIS — G40909 Epilepsy, unspecified, not intractable, without status epilepticus: Secondary | ICD-10-CM | POA: Diagnosis not present

## 2015-04-23 LAB — CBC WITH DIFFERENTIAL/PLATELET
BASOS PCT: 0.2 % (ref 0.0–3.0)
Basophils Absolute: 0 10*3/uL (ref 0.0–0.1)
Eosinophils Absolute: 0 10*3/uL (ref 0.0–0.7)
Eosinophils Relative: 0.3 % (ref 0.0–5.0)
HCT: 37 % (ref 36.0–46.0)
Hemoglobin: 12.3 g/dL (ref 12.0–15.0)
LYMPHS PCT: 12.3 % (ref 12.0–46.0)
Lymphs Abs: 1.3 10*3/uL (ref 0.7–4.0)
MCHC: 33.2 g/dL (ref 30.0–36.0)
MCV: 93.1 fl (ref 78.0–100.0)
MONOS PCT: 9.3 % (ref 3.0–12.0)
Monocytes Absolute: 1 10*3/uL (ref 0.1–1.0)
NEUTROS ABS: 8.1 10*3/uL — AB (ref 1.4–7.7)
Neutrophils Relative %: 77.9 % — ABNORMAL HIGH (ref 43.0–77.0)
PLATELETS: 208 10*3/uL (ref 150.0–400.0)
RBC: 3.98 Mil/uL (ref 3.87–5.11)
RDW: 16.4 % — AB (ref 11.5–15.5)
WBC: 10.4 10*3/uL (ref 4.0–10.5)

## 2015-04-23 LAB — COMPREHENSIVE METABOLIC PANEL
ALT: 23 U/L (ref 0–35)
AST: 19 U/L (ref 0–37)
Albumin: 4 g/dL (ref 3.5–5.2)
Alkaline Phosphatase: 62 U/L (ref 39–117)
BUN: 21 mg/dL (ref 6–23)
CALCIUM: 9.4 mg/dL (ref 8.4–10.5)
CO2: 30 meq/L (ref 19–32)
Chloride: 106 mEq/L (ref 96–112)
Creatinine, Ser: 0.9 mg/dL (ref 0.40–1.20)
GFR: 61.76 mL/min (ref >60.00)
Glucose, Bld: 87 mg/dL (ref 70–99)
Potassium: 3.8 mEq/L (ref 3.5–5.1)
Sodium: 145 mEq/L (ref 135–145)
Total Bilirubin: 1 mg/dL (ref 0.2–1.2)
Total Protein: 6.9 g/dL (ref 6.0–8.3)

## 2015-04-23 NOTE — Assessment & Plan Note (Signed)
Controlled on losartan 100 mg. Stay off labetalol given bradycardia and asystole in hospital

## 2015-04-23 NOTE — Patient Instructions (Signed)
I would like to see you again in 1 month to check in. I want you to remain positive and don't beat yourself up when you are not able to do everything you would like to do.   Blood pressure looks good. Continue current medicine- losartan 100mg  and multivitamin daily and can take at same time of day.

## 2015-04-23 NOTE — Assessment & Plan Note (Addendum)
Continue home PT and OT. Encouraged patient about slow recovery. We also discussed given recent illness of her and her husband is likely life-changing event and they will not be as independent as previously. I discouraged her from driving she agrees not to drive. Continue off anticoagulation or antiplatelets. Continue blood pressure control. I'm concerned about the development of depression potentially and will have patient back in one month

## 2015-04-23 NOTE — Progress Notes (Signed)
Garret Reddish, MD  Subjective:  [name] is a 81 y.o. year old very pleasant female patient who presents for transitional care management and hospital follow up for right temporal intraparenchymal hemorrhage. Patient was hospitalized from 03/19/15 to 03/29/15 as well as rehab hospitalization from 03/29/15 to 04/17/15. A TCM phone call was completed on 04/18/15. Medical complexity high  Review of discharge summary shows patient was admitted for altered mental status, left hemiparesis, dysarthria and she was found to have right temporal intraparenchymal hemorrhage. She had multiple repeat neuro imaging evaluations which showed stable/appropriately evolving hemorrhage and also have a small subdural. She was evaluated by neurosurgery who said she would not be candidate for surgery. Patient did have cerebral edema and midline shift of 8 mm. Concern was this is related to her hypertension or cerebral amyloid angiopathy. Discharge summary states pointed toward amyloid given normotensive on arrival to emergency room. Patient had a mild left hemiparesis upon discharge from hospital discharge from rehabilitation showed 4 out of 5 strength bilaterally. Notes steady she was on aspirin at home but this was not listed on med and patient denies taking this. She was discharged to [facility] slow steady progress. There was concern for seizures but EEG was consistent with encephalopathy. Patient was initially placed on Keppra 500 mg twice a day. Final discharge summary does not show she was discharged on Keppra. No anti-thrombotic is to be used given this is an intraparenchymal hemorrhage.  Patient also had encephalopathy which was thought due to UTI reaction. There was concern this could be seizure related or a reaction to Keppra this was later thought to be less likely. She had a leukocytosis during hospitalization which was down to 11k wbc by time of discharge. Her white blood count is not elevated at this time. She was treated  with Ceftin for 5 days and encephalopathy resolved. She had a negative chest x-ray on 2 separate occasions. Blood cultures were also negative. She was hydrated as well due to decreased by mouth with encephalopathy.  Patient had bradycardia with asystole 2 during the first night of hospitalization. Her heart rate went into the 20s as well during this night. She had her labetalol stopped obviously. She had mildly elevated troponins thought related to neurogenic causes. Patient did not have chest pain. She did have an echo which showed an EF of 70%.  Patient was also found to have atrial fibrillation during her hospitalization. This was confirmed on EKG after initially being noted on telemetry. Due to intracranial hemorrhage she was unable to be placed on anticoagulation and should not be on anticoagulation.  Her blood pressure was initially controlled on home meds. She had her labetalol discontinued due to bradycardia. Her Cozaar dosing was bumped up slightly to 100 mg.  Patient did well in inpatient rehabilitation. She did have a flat affect at discharge. Her strength was 4-4+ out of 5 in bilateral upper and lower extremities. She was discharged with a rolling walker. She was to continue PT and OT as. She did have some issues with knee pain during hospitalization. She was given Voltaren gel does help some. She is also given a neoprene sleeve which gave her some support. A face-to-face was performed during inpatient rehabilitation for continuing services.  Patient's husband has been sick during this time as well. Patient admits she's had a very hard time adjusting to being back home. Things are very different than they were before. She is having some short-term memory issues. Things seem very complicated to  her. She's had a hard time remembering to call people that she wants to think further help. She seems to be beating herself up a good bit. I encouraged patient that this will take some time to improve  and is not uncommon to have some memory issues after a stroke.  ROS- see HPI, also denies chest pain or shortness of breath. No Nausea vomiting. No Dysuria or polyuria.  Past Medical History-   Medications- reviewed and updated Current Outpatient Prescriptions  Medication Sig Dispense Refill  . diclofenac sodium (VOLTAREN) 1 % GEL Apply 2 g topically 4 (four) times daily. 1 Tube 1  . losartan (COZAAR) 100 MG tablet Take 1 tablet (100 mg total) by mouth daily. 30 tablet 5  . Multiple Vitamin (MULTIVITAMIN) tablet Take 1 tablet by mouth daily.       Objective: BP 122/80 mmHg  Pulse 73  Temp(Src) 98.3 F (36.8 C)  Wt 114 lb (51.71 kg) Gen: NAD, resting comfortably in chair, brings rolling walker CV: RRR no murmurs rubs or gallops Lungs: CTAB no crackles, wheeze, rhonchi Abdomen: soft/nontender/nondistended/normal bowel sounds. No rebound or guarding.  Ext: no edema Skin: warm, dry Neuro: difficult to tell discernable difference between right and left side both 4+/5 strength, has some short term memory issues but usually able to find word or thought she is looking for, left lip does not raise as high as right on smile and when puffs cheeks- some air escapes but able to stop after multiple attempts. PERRLA, tongue and uvula midline. Sensation same bilateral to gross touch   Assessment/Plan:  ICH (intracerebral hemorrhage) causing midline shift Continue home PT and OT. Encouraged patient about slow recovery. We also discussed given recent illness of her and her husband is likely life-changing event and they will not be as independent as previously. I discouraged her from driving she agrees not to drive. Continue off anticoagulation or antiplatelets. Continue blood pressure control. I'm concerned about the development of depression potentially and will have patient back in one month  Essential hypertension Controlled on losartan 100 mg. Stay off labetalol given bradycardia and asystole in  hospital  Arthritis of right knee No longer taking tramadol. Using neoprene sleeve. Also using  Voltaren gel after stroke. I think this is preferable treatment.  Encephalopathy resolved after anti-biotics. Is having some short-term memory issues after the stroke. Some trouble reacclimating to home  Acute cystitis without hematuria- resolved after anti-biotics  Also CBC showed resolution of leukocytosis. Resolution of anemia. CMET showed resolution of elevated LFTs.  1 month follow up  Results for orders placed or performed in visit on 04/23/15 (from the past 24 hour(s))  CBC with Differential     Status: Abnormal   Collection Time: 04/23/15 12:35 PM  Result Value Ref Range   WBC 10.4 4.0 - 10.5 K/uL   RBC 3.98 3.87 - 5.11 Mil/uL   Hemoglobin 12.3 12.0 - 15.0 g/dL   HCT 37.0 36.0 - 46.0 %   MCV 93.1 78.0 - 100.0 fl   MCHC 33.2 30.0 - 36.0 g/dL   RDW 16.4 (H) 11.5 - 15.5 %   Platelets 208.0 150.0 - 400.0 K/uL   Neutrophils Relative % 77.9 (H) 43.0 - 77.0 %   Lymphocytes Relative 12.3 12.0 - 46.0 %   Monocytes Relative 9.3 3.0 - 12.0 %   Eosinophils Relative 0.3 0.0 - 5.0 %   Basophils Relative 0.2 0.0 - 3.0 %   Neutro Abs 8.1 (H) 1.4 - 7.7 K/uL  Lymphs Abs 1.3 0.7 - 4.0 K/uL   Monocytes Absolute 1.0 0.1 - 1.0 K/uL   Eosinophils Absolute 0.0 0.0 - 0.7 K/uL   Basophils Absolute 0.0 0.0 - 0.1 K/uL  Comprehensive metabolic panel     Status: None   Collection Time: 04/23/15 12:35 PM  Result Value Ref Range   Sodium 145 135 - 145 mEq/L   Potassium 3.8 3.5 - 5.1 mEq/L   Chloride 106 96 - 112 mEq/L   CO2 30 19 - 32 mEq/L   Glucose, Bld 87 70 - 99 mg/dL   BUN 21 6 - 23 mg/dL   Creatinine, Ser 0.90 0.40 - 1.20 mg/dL   Total Bilirubin 1.0 0.2 - 1.2 mg/dL   Alkaline Phosphatase 62 39 - 117 U/L   AST 19 0 - 37 U/L   ALT 23 0 - 35 U/L   Total Protein 6.9 6.0 - 8.3 g/dL   Albumin 4.0 3.5 - 5.2 g/dL   Calcium 9.4 8.4 - 10.5 mg/dL   GFR 61.76 >60.00 mL/min

## 2015-04-23 NOTE — Assessment & Plan Note (Signed)
No longer taking tramadol. Using neoprene sleeve. Also using  Voltaren gel after stroke. I think this is preferable treatment.

## 2015-04-24 ENCOUNTER — Telehealth: Payer: Self-pay | Admitting: *Deleted

## 2015-04-24 DIAGNOSIS — I69354 Hemiplegia and hemiparesis following cerebral infarction affecting left non-dominant side: Secondary | ICD-10-CM | POA: Diagnosis not present

## 2015-04-24 DIAGNOSIS — D72829 Elevated white blood cell count, unspecified: Secondary | ICD-10-CM | POA: Diagnosis not present

## 2015-04-24 DIAGNOSIS — I4891 Unspecified atrial fibrillation: Secondary | ICD-10-CM | POA: Diagnosis not present

## 2015-04-24 DIAGNOSIS — G40909 Epilepsy, unspecified, not intractable, without status epilepticus: Secondary | ICD-10-CM | POA: Diagnosis not present

## 2015-04-24 DIAGNOSIS — I10 Essential (primary) hypertension: Secondary | ICD-10-CM | POA: Diagnosis not present

## 2015-04-24 DIAGNOSIS — M4806 Spinal stenosis, lumbar region: Secondary | ICD-10-CM | POA: Diagnosis not present

## 2015-04-24 DIAGNOSIS — I69322 Dysarthria following cerebral infarction: Secondary | ICD-10-CM | POA: Diagnosis not present

## 2015-04-24 NOTE — Telephone Encounter (Signed)
PT from [facility] called asking for verbal orders to begin pt home visits....verbal orders given per office protocol

## 2015-04-25 ENCOUNTER — Telehealth: Payer: Self-pay | Admitting: *Deleted

## 2015-04-25 DIAGNOSIS — M4806 Spinal stenosis, lumbar region: Secondary | ICD-10-CM | POA: Diagnosis not present

## 2015-04-25 DIAGNOSIS — I69322 Dysarthria following cerebral infarction: Secondary | ICD-10-CM | POA: Diagnosis not present

## 2015-04-25 DIAGNOSIS — D72829 Elevated white blood cell count, unspecified: Secondary | ICD-10-CM | POA: Diagnosis not present

## 2015-04-25 DIAGNOSIS — I10 Essential (primary) hypertension: Secondary | ICD-10-CM | POA: Diagnosis not present

## 2015-04-25 DIAGNOSIS — I4891 Unspecified atrial fibrillation: Secondary | ICD-10-CM | POA: Diagnosis not present

## 2015-04-25 DIAGNOSIS — G40909 Epilepsy, unspecified, not intractable, without status epilepticus: Secondary | ICD-10-CM | POA: Diagnosis not present

## 2015-04-25 DIAGNOSIS — I69354 Hemiplegia and hemiparesis following cerebral infarction affecting left non-dominant side: Secondary | ICD-10-CM | POA: Diagnosis not present

## 2015-04-25 NOTE — Telephone Encounter (Signed)
[name] called requesting plan of care for OT 1wk1, 2wk6.  She is also requesting a visit from MSW to assist with needs and community resources.  Approval given.

## 2015-04-30 DIAGNOSIS — D72829 Elevated white blood cell count, unspecified: Secondary | ICD-10-CM | POA: Diagnosis not present

## 2015-04-30 DIAGNOSIS — I69322 Dysarthria following cerebral infarction: Secondary | ICD-10-CM | POA: Diagnosis not present

## 2015-04-30 DIAGNOSIS — M4806 Spinal stenosis, lumbar region: Secondary | ICD-10-CM | POA: Diagnosis not present

## 2015-04-30 DIAGNOSIS — I69354 Hemiplegia and hemiparesis following cerebral infarction affecting left non-dominant side: Secondary | ICD-10-CM | POA: Diagnosis not present

## 2015-04-30 DIAGNOSIS — I4891 Unspecified atrial fibrillation: Secondary | ICD-10-CM | POA: Diagnosis not present

## 2015-04-30 DIAGNOSIS — G40909 Epilepsy, unspecified, not intractable, without status epilepticus: Secondary | ICD-10-CM | POA: Diagnosis not present

## 2015-04-30 DIAGNOSIS — I10 Essential (primary) hypertension: Secondary | ICD-10-CM | POA: Diagnosis not present

## 2015-05-01 DIAGNOSIS — M4806 Spinal stenosis, lumbar region: Secondary | ICD-10-CM | POA: Diagnosis not present

## 2015-05-01 DIAGNOSIS — D72829 Elevated white blood cell count, unspecified: Secondary | ICD-10-CM | POA: Diagnosis not present

## 2015-05-01 DIAGNOSIS — I69322 Dysarthria following cerebral infarction: Secondary | ICD-10-CM | POA: Diagnosis not present

## 2015-05-01 DIAGNOSIS — I69354 Hemiplegia and hemiparesis following cerebral infarction affecting left non-dominant side: Secondary | ICD-10-CM | POA: Diagnosis not present

## 2015-05-01 DIAGNOSIS — I10 Essential (primary) hypertension: Secondary | ICD-10-CM | POA: Diagnosis not present

## 2015-05-01 DIAGNOSIS — G40909 Epilepsy, unspecified, not intractable, without status epilepticus: Secondary | ICD-10-CM | POA: Diagnosis not present

## 2015-05-01 DIAGNOSIS — I4891 Unspecified atrial fibrillation: Secondary | ICD-10-CM | POA: Diagnosis not present

## 2015-05-02 DIAGNOSIS — G40909 Epilepsy, unspecified, not intractable, without status epilepticus: Secondary | ICD-10-CM | POA: Diagnosis not present

## 2015-05-02 DIAGNOSIS — M4806 Spinal stenosis, lumbar region: Secondary | ICD-10-CM | POA: Diagnosis not present

## 2015-05-02 DIAGNOSIS — I69322 Dysarthria following cerebral infarction: Secondary | ICD-10-CM | POA: Diagnosis not present

## 2015-05-02 DIAGNOSIS — D72829 Elevated white blood cell count, unspecified: Secondary | ICD-10-CM | POA: Diagnosis not present

## 2015-05-02 DIAGNOSIS — I69354 Hemiplegia and hemiparesis following cerebral infarction affecting left non-dominant side: Secondary | ICD-10-CM | POA: Diagnosis not present

## 2015-05-02 DIAGNOSIS — I4891 Unspecified atrial fibrillation: Secondary | ICD-10-CM | POA: Diagnosis not present

## 2015-05-02 DIAGNOSIS — I10 Essential (primary) hypertension: Secondary | ICD-10-CM | POA: Diagnosis not present

## 2015-05-03 ENCOUNTER — Telehealth: Payer: Self-pay | Admitting: *Deleted

## 2015-05-03 DIAGNOSIS — G40909 Epilepsy, unspecified, not intractable, without status epilepticus: Secondary | ICD-10-CM | POA: Diagnosis not present

## 2015-05-03 DIAGNOSIS — M4806 Spinal stenosis, lumbar region: Secondary | ICD-10-CM | POA: Diagnosis not present

## 2015-05-03 DIAGNOSIS — I69354 Hemiplegia and hemiparesis following cerebral infarction affecting left non-dominant side: Secondary | ICD-10-CM | POA: Diagnosis not present

## 2015-05-03 DIAGNOSIS — I4891 Unspecified atrial fibrillation: Secondary | ICD-10-CM | POA: Diagnosis not present

## 2015-05-03 DIAGNOSIS — I10 Essential (primary) hypertension: Secondary | ICD-10-CM | POA: Diagnosis not present

## 2015-05-03 DIAGNOSIS — I69322 Dysarthria following cerebral infarction: Secondary | ICD-10-CM | POA: Diagnosis not present

## 2015-05-03 DIAGNOSIS — D72829 Elevated white blood cell count, unspecified: Secondary | ICD-10-CM | POA: Diagnosis not present

## 2015-05-03 NOTE — Telephone Encounter (Signed)
[name] from [facility] called asking for verbal orders for home health aide to assist pt with showering 2X a week for 5 weeks.   I called back and gave the verbal okay per office protocol

## 2015-05-06 DIAGNOSIS — I69322 Dysarthria following cerebral infarction: Secondary | ICD-10-CM | POA: Diagnosis not present

## 2015-05-06 DIAGNOSIS — M4806 Spinal stenosis, lumbar region: Secondary | ICD-10-CM | POA: Diagnosis not present

## 2015-05-06 DIAGNOSIS — G40909 Epilepsy, unspecified, not intractable, without status epilepticus: Secondary | ICD-10-CM | POA: Diagnosis not present

## 2015-05-06 DIAGNOSIS — D72829 Elevated white blood cell count, unspecified: Secondary | ICD-10-CM | POA: Diagnosis not present

## 2015-05-06 DIAGNOSIS — I69354 Hemiplegia and hemiparesis following cerebral infarction affecting left non-dominant side: Secondary | ICD-10-CM | POA: Diagnosis not present

## 2015-05-06 DIAGNOSIS — I10 Essential (primary) hypertension: Secondary | ICD-10-CM | POA: Diagnosis not present

## 2015-05-06 DIAGNOSIS — I4891 Unspecified atrial fibrillation: Secondary | ICD-10-CM | POA: Diagnosis not present

## 2015-05-07 DIAGNOSIS — D72829 Elevated white blood cell count, unspecified: Secondary | ICD-10-CM | POA: Diagnosis not present

## 2015-05-07 DIAGNOSIS — I10 Essential (primary) hypertension: Secondary | ICD-10-CM | POA: Diagnosis not present

## 2015-05-07 DIAGNOSIS — I69322 Dysarthria following cerebral infarction: Secondary | ICD-10-CM | POA: Diagnosis not present

## 2015-05-07 DIAGNOSIS — G40909 Epilepsy, unspecified, not intractable, without status epilepticus: Secondary | ICD-10-CM | POA: Diagnosis not present

## 2015-05-07 DIAGNOSIS — I69354 Hemiplegia and hemiparesis following cerebral infarction affecting left non-dominant side: Secondary | ICD-10-CM | POA: Diagnosis not present

## 2015-05-07 DIAGNOSIS — I4891 Unspecified atrial fibrillation: Secondary | ICD-10-CM | POA: Diagnosis not present

## 2015-05-07 DIAGNOSIS — M4806 Spinal stenosis, lumbar region: Secondary | ICD-10-CM | POA: Diagnosis not present

## 2015-05-08 DIAGNOSIS — M4806 Spinal stenosis, lumbar region: Secondary | ICD-10-CM | POA: Diagnosis not present

## 2015-05-08 DIAGNOSIS — I69354 Hemiplegia and hemiparesis following cerebral infarction affecting left non-dominant side: Secondary | ICD-10-CM | POA: Diagnosis not present

## 2015-05-08 DIAGNOSIS — I10 Essential (primary) hypertension: Secondary | ICD-10-CM | POA: Diagnosis not present

## 2015-05-08 DIAGNOSIS — I69322 Dysarthria following cerebral infarction: Secondary | ICD-10-CM | POA: Diagnosis not present

## 2015-05-08 DIAGNOSIS — D72829 Elevated white blood cell count, unspecified: Secondary | ICD-10-CM | POA: Diagnosis not present

## 2015-05-08 DIAGNOSIS — G40909 Epilepsy, unspecified, not intractable, without status epilepticus: Secondary | ICD-10-CM | POA: Diagnosis not present

## 2015-05-08 DIAGNOSIS — I4891 Unspecified atrial fibrillation: Secondary | ICD-10-CM | POA: Diagnosis not present

## 2015-05-09 DIAGNOSIS — G40909 Epilepsy, unspecified, not intractable, without status epilepticus: Secondary | ICD-10-CM | POA: Diagnosis not present

## 2015-05-09 DIAGNOSIS — M4806 Spinal stenosis, lumbar region: Secondary | ICD-10-CM | POA: Diagnosis not present

## 2015-05-09 DIAGNOSIS — D72829 Elevated white blood cell count, unspecified: Secondary | ICD-10-CM | POA: Diagnosis not present

## 2015-05-09 DIAGNOSIS — I69354 Hemiplegia and hemiparesis following cerebral infarction affecting left non-dominant side: Secondary | ICD-10-CM | POA: Diagnosis not present

## 2015-05-09 DIAGNOSIS — I69322 Dysarthria following cerebral infarction: Secondary | ICD-10-CM | POA: Diagnosis not present

## 2015-05-09 DIAGNOSIS — I4891 Unspecified atrial fibrillation: Secondary | ICD-10-CM | POA: Diagnosis not present

## 2015-05-09 DIAGNOSIS — I10 Essential (primary) hypertension: Secondary | ICD-10-CM | POA: Diagnosis not present

## 2015-05-10 DIAGNOSIS — I4891 Unspecified atrial fibrillation: Secondary | ICD-10-CM | POA: Diagnosis not present

## 2015-05-10 DIAGNOSIS — I69322 Dysarthria following cerebral infarction: Secondary | ICD-10-CM | POA: Diagnosis not present

## 2015-05-10 DIAGNOSIS — G40909 Epilepsy, unspecified, not intractable, without status epilepticus: Secondary | ICD-10-CM | POA: Diagnosis not present

## 2015-05-10 DIAGNOSIS — I69354 Hemiplegia and hemiparesis following cerebral infarction affecting left non-dominant side: Secondary | ICD-10-CM | POA: Diagnosis not present

## 2015-05-10 DIAGNOSIS — M4806 Spinal stenosis, lumbar region: Secondary | ICD-10-CM | POA: Diagnosis not present

## 2015-05-10 DIAGNOSIS — D72829 Elevated white blood cell count, unspecified: Secondary | ICD-10-CM | POA: Diagnosis not present

## 2015-05-10 DIAGNOSIS — I10 Essential (primary) hypertension: Secondary | ICD-10-CM | POA: Diagnosis not present

## 2015-05-13 DIAGNOSIS — I69354 Hemiplegia and hemiparesis following cerebral infarction affecting left non-dominant side: Secondary | ICD-10-CM | POA: Diagnosis not present

## 2015-05-13 DIAGNOSIS — G40909 Epilepsy, unspecified, not intractable, without status epilepticus: Secondary | ICD-10-CM | POA: Diagnosis not present

## 2015-05-13 DIAGNOSIS — D72829 Elevated white blood cell count, unspecified: Secondary | ICD-10-CM | POA: Diagnosis not present

## 2015-05-13 DIAGNOSIS — M4806 Spinal stenosis, lumbar region: Secondary | ICD-10-CM | POA: Diagnosis not present

## 2015-05-13 DIAGNOSIS — I69322 Dysarthria following cerebral infarction: Secondary | ICD-10-CM | POA: Diagnosis not present

## 2015-05-13 DIAGNOSIS — I10 Essential (primary) hypertension: Secondary | ICD-10-CM | POA: Diagnosis not present

## 2015-05-13 DIAGNOSIS — I4891 Unspecified atrial fibrillation: Secondary | ICD-10-CM | POA: Diagnosis not present

## 2015-05-14 ENCOUNTER — Telehealth: Payer: Self-pay | Admitting: *Deleted

## 2015-05-14 DIAGNOSIS — I10 Essential (primary) hypertension: Secondary | ICD-10-CM | POA: Diagnosis not present

## 2015-05-14 DIAGNOSIS — I69354 Hemiplegia and hemiparesis following cerebral infarction affecting left non-dominant side: Secondary | ICD-10-CM | POA: Diagnosis not present

## 2015-05-14 DIAGNOSIS — G40909 Epilepsy, unspecified, not intractable, without status epilepticus: Secondary | ICD-10-CM | POA: Diagnosis not present

## 2015-05-14 DIAGNOSIS — D72829 Elevated white blood cell count, unspecified: Secondary | ICD-10-CM | POA: Diagnosis not present

## 2015-05-14 DIAGNOSIS — I69322 Dysarthria following cerebral infarction: Secondary | ICD-10-CM | POA: Diagnosis not present

## 2015-05-14 DIAGNOSIS — M4806 Spinal stenosis, lumbar region: Secondary | ICD-10-CM | POA: Diagnosis not present

## 2015-05-14 DIAGNOSIS — I4891 Unspecified atrial fibrillation: Secondary | ICD-10-CM | POA: Diagnosis not present

## 2015-05-14 NOTE — Telephone Encounter (Signed)
Shailey Butterbaugh MSW [place] called to request additional visits [id number] to assist with community resources for [place] and her husband. Approval given

## 2015-05-15 DIAGNOSIS — D72829 Elevated white blood cell count, unspecified: Secondary | ICD-10-CM | POA: Diagnosis not present

## 2015-05-15 DIAGNOSIS — I69354 Hemiplegia and hemiparesis following cerebral infarction affecting left non-dominant side: Secondary | ICD-10-CM | POA: Diagnosis not present

## 2015-05-15 DIAGNOSIS — I4891 Unspecified atrial fibrillation: Secondary | ICD-10-CM | POA: Diagnosis not present

## 2015-05-15 DIAGNOSIS — G40909 Epilepsy, unspecified, not intractable, without status epilepticus: Secondary | ICD-10-CM | POA: Diagnosis not present

## 2015-05-15 DIAGNOSIS — I69322 Dysarthria following cerebral infarction: Secondary | ICD-10-CM | POA: Diagnosis not present

## 2015-05-15 DIAGNOSIS — M4806 Spinal stenosis, lumbar region: Secondary | ICD-10-CM | POA: Diagnosis not present

## 2015-05-15 DIAGNOSIS — I10 Essential (primary) hypertension: Secondary | ICD-10-CM | POA: Diagnosis not present

## 2015-05-16 DIAGNOSIS — I4891 Unspecified atrial fibrillation: Secondary | ICD-10-CM | POA: Diagnosis not present

## 2015-05-16 DIAGNOSIS — I69354 Hemiplegia and hemiparesis following cerebral infarction affecting left non-dominant side: Secondary | ICD-10-CM | POA: Diagnosis not present

## 2015-05-16 DIAGNOSIS — D72829 Elevated white blood cell count, unspecified: Secondary | ICD-10-CM | POA: Diagnosis not present

## 2015-05-16 DIAGNOSIS — M4806 Spinal stenosis, lumbar region: Secondary | ICD-10-CM | POA: Diagnosis not present

## 2015-05-16 DIAGNOSIS — I69322 Dysarthria following cerebral infarction: Secondary | ICD-10-CM | POA: Diagnosis not present

## 2015-05-16 DIAGNOSIS — G40909 Epilepsy, unspecified, not intractable, without status epilepticus: Secondary | ICD-10-CM | POA: Diagnosis not present

## 2015-05-16 DIAGNOSIS — I10 Essential (primary) hypertension: Secondary | ICD-10-CM | POA: Diagnosis not present

## 2015-05-17 DIAGNOSIS — M4806 Spinal stenosis, lumbar region: Secondary | ICD-10-CM | POA: Diagnosis not present

## 2015-05-17 DIAGNOSIS — G40909 Epilepsy, unspecified, not intractable, without status epilepticus: Secondary | ICD-10-CM | POA: Diagnosis not present

## 2015-05-17 DIAGNOSIS — I69322 Dysarthria following cerebral infarction: Secondary | ICD-10-CM | POA: Diagnosis not present

## 2015-05-17 DIAGNOSIS — I10 Essential (primary) hypertension: Secondary | ICD-10-CM | POA: Diagnosis not present

## 2015-05-17 DIAGNOSIS — I69354 Hemiplegia and hemiparesis following cerebral infarction affecting left non-dominant side: Secondary | ICD-10-CM | POA: Diagnosis not present

## 2015-05-17 DIAGNOSIS — D72829 Elevated white blood cell count, unspecified: Secondary | ICD-10-CM | POA: Diagnosis not present

## 2015-05-17 DIAGNOSIS — I4891 Unspecified atrial fibrillation: Secondary | ICD-10-CM | POA: Diagnosis not present

## 2015-05-21 DIAGNOSIS — G40909 Epilepsy, unspecified, not intractable, without status epilepticus: Secondary | ICD-10-CM | POA: Diagnosis not present

## 2015-05-21 DIAGNOSIS — I4891 Unspecified atrial fibrillation: Secondary | ICD-10-CM | POA: Diagnosis not present

## 2015-05-21 DIAGNOSIS — D72829 Elevated white blood cell count, unspecified: Secondary | ICD-10-CM | POA: Diagnosis not present

## 2015-05-21 DIAGNOSIS — I69322 Dysarthria following cerebral infarction: Secondary | ICD-10-CM | POA: Diagnosis not present

## 2015-05-21 DIAGNOSIS — I10 Essential (primary) hypertension: Secondary | ICD-10-CM | POA: Diagnosis not present

## 2015-05-21 DIAGNOSIS — M4806 Spinal stenosis, lumbar region: Secondary | ICD-10-CM | POA: Diagnosis not present

## 2015-05-21 DIAGNOSIS — I69354 Hemiplegia and hemiparesis following cerebral infarction affecting left non-dominant side: Secondary | ICD-10-CM | POA: Diagnosis not present

## 2015-05-22 DIAGNOSIS — I69354 Hemiplegia and hemiparesis following cerebral infarction affecting left non-dominant side: Secondary | ICD-10-CM | POA: Diagnosis not present

## 2015-05-22 DIAGNOSIS — M4806 Spinal stenosis, lumbar region: Secondary | ICD-10-CM | POA: Diagnosis not present

## 2015-05-22 DIAGNOSIS — D72829 Elevated white blood cell count, unspecified: Secondary | ICD-10-CM | POA: Diagnosis not present

## 2015-05-22 DIAGNOSIS — I4891 Unspecified atrial fibrillation: Secondary | ICD-10-CM | POA: Diagnosis not present

## 2015-05-22 DIAGNOSIS — G40909 Epilepsy, unspecified, not intractable, without status epilepticus: Secondary | ICD-10-CM | POA: Diagnosis not present

## 2015-05-22 DIAGNOSIS — I10 Essential (primary) hypertension: Secondary | ICD-10-CM | POA: Diagnosis not present

## 2015-05-22 DIAGNOSIS — I69322 Dysarthria following cerebral infarction: Secondary | ICD-10-CM | POA: Diagnosis not present

## 2015-05-23 DIAGNOSIS — G40909 Epilepsy, unspecified, not intractable, without status epilepticus: Secondary | ICD-10-CM | POA: Diagnosis not present

## 2015-05-23 DIAGNOSIS — I10 Essential (primary) hypertension: Secondary | ICD-10-CM | POA: Diagnosis not present

## 2015-05-23 DIAGNOSIS — I4891 Unspecified atrial fibrillation: Secondary | ICD-10-CM | POA: Diagnosis not present

## 2015-05-23 DIAGNOSIS — I69354 Hemiplegia and hemiparesis following cerebral infarction affecting left non-dominant side: Secondary | ICD-10-CM | POA: Diagnosis not present

## 2015-05-23 DIAGNOSIS — D72829 Elevated white blood cell count, unspecified: Secondary | ICD-10-CM | POA: Diagnosis not present

## 2015-05-23 DIAGNOSIS — I69322 Dysarthria following cerebral infarction: Secondary | ICD-10-CM | POA: Diagnosis not present

## 2015-05-23 DIAGNOSIS — M4806 Spinal stenosis, lumbar region: Secondary | ICD-10-CM | POA: Diagnosis not present

## 2015-05-24 DIAGNOSIS — I69354 Hemiplegia and hemiparesis following cerebral infarction affecting left non-dominant side: Secondary | ICD-10-CM | POA: Diagnosis not present

## 2015-05-24 DIAGNOSIS — I10 Essential (primary) hypertension: Secondary | ICD-10-CM | POA: Diagnosis not present

## 2015-05-24 DIAGNOSIS — I69322 Dysarthria following cerebral infarction: Secondary | ICD-10-CM | POA: Diagnosis not present

## 2015-05-24 DIAGNOSIS — G40909 Epilepsy, unspecified, not intractable, without status epilepticus: Secondary | ICD-10-CM | POA: Diagnosis not present

## 2015-05-24 DIAGNOSIS — D72829 Elevated white blood cell count, unspecified: Secondary | ICD-10-CM | POA: Diagnosis not present

## 2015-05-24 DIAGNOSIS — M4806 Spinal stenosis, lumbar region: Secondary | ICD-10-CM | POA: Diagnosis not present

## 2015-05-24 DIAGNOSIS — I4891 Unspecified atrial fibrillation: Secondary | ICD-10-CM | POA: Diagnosis not present

## 2015-05-28 DIAGNOSIS — I10 Essential (primary) hypertension: Secondary | ICD-10-CM | POA: Diagnosis not present

## 2015-05-28 DIAGNOSIS — D72829 Elevated white blood cell count, unspecified: Secondary | ICD-10-CM | POA: Diagnosis not present

## 2015-05-28 DIAGNOSIS — I69354 Hemiplegia and hemiparesis following cerebral infarction affecting left non-dominant side: Secondary | ICD-10-CM | POA: Diagnosis not present

## 2015-05-28 DIAGNOSIS — M4806 Spinal stenosis, lumbar region: Secondary | ICD-10-CM | POA: Diagnosis not present

## 2015-05-28 DIAGNOSIS — G40909 Epilepsy, unspecified, not intractable, without status epilepticus: Secondary | ICD-10-CM | POA: Diagnosis not present

## 2015-05-28 DIAGNOSIS — I4891 Unspecified atrial fibrillation: Secondary | ICD-10-CM | POA: Diagnosis not present

## 2015-05-28 DIAGNOSIS — I69322 Dysarthria following cerebral infarction: Secondary | ICD-10-CM | POA: Diagnosis not present

## 2015-05-29 ENCOUNTER — Encounter: Payer: Self-pay | Admitting: Neurology

## 2015-05-29 ENCOUNTER — Ambulatory Visit (INDEPENDENT_AMBULATORY_CARE_PROVIDER_SITE_OTHER): Payer: Medicare Other | Admitting: Neurology

## 2015-05-29 VITALS — BP 139/72 | HR 58 | Ht 60.0 in | Wt 113.8 lb

## 2015-05-29 DIAGNOSIS — I482 Chronic atrial fibrillation, unspecified: Secondary | ICD-10-CM

## 2015-05-29 DIAGNOSIS — I611 Nontraumatic intracerebral hemorrhage in hemisphere, cortical: Secondary | ICD-10-CM | POA: Diagnosis not present

## 2015-05-29 DIAGNOSIS — I10 Essential (primary) hypertension: Secondary | ICD-10-CM

## 2015-05-29 DIAGNOSIS — I680 Cerebral amyloid angiopathy: Secondary | ICD-10-CM | POA: Diagnosis not present

## 2015-05-29 DIAGNOSIS — E854 Organ-limited amyloidosis: Secondary | ICD-10-CM | POA: Insufficient documentation

## 2015-05-29 NOTE — Patient Instructions (Signed)
-   will hold off any antiplatelet or anticoagulation due to history of brain bleeding, and concerning for condition called cerebral amyloid angiopathy.  - although you have afib but you are not a good candidate for any blood thinners. - check BP at home - BP goal less than 140 - continue PT/OT  - Follow up with your primary care physician for stroke risk factor modification. Recommend maintain blood pressure goal <130/80, diabetes with hemoglobin A1c goal below 6.5% and lipids with LDL cholesterol goal below 70 mg/dL.  - follow up in 3 months.

## 2015-05-29 NOTE — Progress Notes (Signed)
STROKE NEUROLOGY FOLLOW UP NOTE  NAME: [name] DOB: Mar 11, 1918  REASON FOR VISIT: stroke follow up HISTORY FROM: daughter and chart  Today we had the pleasure of seeing [name] in follow-up at our Neurology Clinic. Pt was accompanied by daughter.   History Summary [name] is a 81 y.o. female with history of HTN and left Bell's palsy many years ago presenting with altered mental status, left hemiparesis, dysarthria and admitted on 03/19/2015.  MRI and CT showed large right temporal ICH with IVH and cerebral edema. Etiology not clear, either hypertensive versus CAA.  Repeat CT stable. However, she developed altered mental status,  very lethargic, found to have UTI , and was treated with antibodies.  EEG showed frequent right frontal sharp waves , and she was put on Keppra. Repeat EEG showed triphasic waves consistent with encephalopathy , which likely due to UTI.  Patient also was found to have A. Fib , and bradycardia with asystole episode. However she was not a candidate for anticoagulation at the time.  She was then discharged to CIR after stabilization.  Interval History During the interval time, the patient has been doing well. She stayed in CIR for about two weeks, and then discharged to home with [facility] which still continues until now. She is weaned off keppra in [place]. Currently only on losartan for BP control. Today BP 139/72.     REVIEW OF SYSTEMS: Full 14 system review of systems performed and notable only for those listed below and in HPI above, all others are negative:  Constitutional:   Cardiovascular:  Ear/Nose/Throat:   Skin:  Eyes:  Loss of vision Respiratory:   Gastroitestinal:   Genitourinary:  Hematology/Lymphatic:   Endocrine:  Musculoskeletal:   Allergy/Immunology:   Neurological:   Psychiatric:  Sleep:   The following represents the patient's updated allergies and side effects list: Allergies  Allergen Reactions  . Ace Inhibitors    REACTION: angioedema    The neurologically relevant items on the patient's problem list were reviewed on today's visit.  Neurologic Examination  A problem focused neurological exam (12 or more points of the single system neurologic examination, vital signs counts as 1 point, cranial nerves count for 8 points) was performed.  Blood pressure 139/72, pulse 58, height 5' (1.524 m), weight 113 lb 12.8 oz (51.619 kg).  General - Well nourished, well developed, in no apparent distress.  Ophthalmologic - Fundi not visualized due to eye movement.  Cardiovascular - irregularly irregular heart rhythm, and bradycardia.  Mental Status -  Level of arousal and orientation to time, place, and person were intact. Language including expression, naming, repetition, comprehension was assessed and found intact. Fund of Knowledge was assessed and was impaired.  Cranial Nerves II - XII - II - Visual field intact OU. III, IV, VI - Extraocular movements intact. V - Facial sensation intact bilaterally. VII - Facial movement decreased on the left, left eyelid smaller. VIII - hard of hearing & vestibular intact bilaterally. X - Palate elevates symmetrically. XI - Chin turning & shoulder shrug intact bilaterally. XII - Tongue protrusion intact.  Motor Strength - The patient's strength was 4/5 in all extremities and pronator drift was absent.  Bulk was normal and fasciculations were absent.   Motor Tone - Muscle tone was assessed at the neck and appendages and was normal.  Reflexes - The patient's reflexes were 1+ in all extremities and she had no pathological reflexes.  Sensory - Light touch, temperature/pinprick  were assessed and were normal.    Coordination - The patient had normal movements in the hands and feet with no ataxia or dysmetria.  Tremor was absent.  Gait and Station - walk with walker, stooped posturing, slow, small stride, but steady.  Data reviewed: I personally reviewed the images  and agree with the radiology interpretations.  Ct Head Wo Contrast 03/24/15 - Unchanged exam with right temporal intraparenchymal hemorrhage, 6 mm right to left midline shift, focal 4 mm right anterior parafalcine subdural hematoma and intraventricular hemorrhage again noted.  03/22/2015 : Expected evolution of multifocal intracranial hemorrhage without significant interval progression. The small subdural hemorrhage overlying the right temporal lobe is significantly less conspicuous on today's exam, the intraparenchymal hemorrhage within the right temporal lobe and small volume intraventricular hemorrhage layering within both occipital horns are essentially unchanged.  03/20/2015 1. No significant interval change in size of right temporal lobe intraparenchymal hemorrhage with slightly increased localized vasogenic edema. 2. No significant interval change in acute subdural hematoma overlying the right cerebral convexity measuring up to 5 mm in maximal thickness. Associated 6 mm of right-to-left shift is not significantly changed. 3. Small volume intraventricular hemorrhage, slightly increased from prior. Asymmetric dilatation of the left lateral ventricle is stable. No hydrocephalus. 4. No new intracranial process.   03/19/2015 Large intraparenchymal hemorrhage of the right temporal lobe with a 6 mm right frontal temporal acute extra-axial hemorrhage. There is right to left midline shift.   03/27/15 - No new hemorrhage. No increase in mass effect. Right temporal hematoma undergoing expected evolutionary changes, becoming less dense and less distinct.  Mr Brain Wo Contrast 03/20/2015 1. Stable size of large right temporal lobe parenchymal hemorrhage with layering blood products. 2. Similar appearance of diffuse vasogenic edema and 8 mm midline shift. 3. Stable small right extra-axial hemorrhage. 4. Blood products layering within the ventricles bilaterally without hydrocephalus. 5. Stable chronic  small vessel T2 changes.   Mr Jodene Nam Head Wo Contrast 03/20/2015 6. The MRA demonstrates displacement of the MCA branch vessels without a focal lesion to explain the hemorrhage. 7. Diffuse small vessel disease is present on the MRA.   Dg Chest Port 1 View 03/25/2015 1. Bilateral medial lung base opacity, mild, likely representing atelectasis although possibility of pneumonia not excluded and follow-up recommended. 2. Aortic atherosclerotic calcification 03/20/2015 Left internal jugular catheter is noted with distal tip in expected position the SVC. No acute cardiopulmonary abnormality seen.  03/28/2015 No acute cardiopulmonary disease. Chest is stable from prior exam.  2D echo - Left ventricle: The cavity size was normal. Wall thickness wasincreased in a pattern of mild LVH. There was focal basalhypertrophy. Systolic function was vigorous. The estimatedejection fraction was in the range of 65% to 70%. Wall motion was normal; there were no regional wall motion abnormalities. - Aortic valve: Moderately calcified annulus. - Mitral valve: Severely calcified annulus. There was mild regurgitation. Valve area by continuity equation (using LVOT flow): 1.47 cm^2. - Left atrium: The atrium was moderately dilated.  EEG  03/25/15 -  1) Frequent right frontal sharp waves Clinical Interpretation: This EEG is consistent with a region of potential epileptogenicity in the right frontal region). No seizure was recorded.  03/26/15  1) Triphasic waves 2) Generalized slow activity.  Clinical Interpretation: This EEG is consistent with an encephalopathy. Triphasic waves are associai]ted with many kinds of toxic/metabolic encephalopathies including infection, hepatic failure, renal failure, medication effect among other causes.   Assessment: As you may recall, she is a 81 y.o. Caucasian  female with PMH of hypertension and left Bell's palsy many years ago was admitted on 03/19/2015 for large right  temporal ICH with IVH and cerebral edema. Etiology not clear, concerning for CAA. Hospitalization complicated with UTI and encephalopathy, and was treated with antibodies.  EEG showed frequent right frontal sharp waves, and she was put on Keppra. Repeat EEG showed triphasic waves consistent with encephalopathy , which likely due to UTI.  Patient also was found to have A. Fib , and bradycardia with asystole episode. However she was not candidate for anticoagulation at the time.  She was then discharged to CIR after stabilization. Her Keppra was discontinued in CIR. During the interval time she was doing well. However, there is dilemma in her medical management. She was not on aspirin 81 until 2 weeks prior to the current [place] event, concerning for CAA. However, patient does have atrial fibrillation and knees anticoagulation for stroke prevention. Discussed with daughter in clinic, she prefer patient not to be on any antiplatelet or anticoagulation in fear of further ICH.  Plan:  - will hold off any antiplatelet or anticoagulation due to concerns of CAA and the recurrent ICH.  - although patient has afib but not a good candidate for anticoagulation. - check BP at home, BP goal < 140 to avoid high risk of bleeding - continue PT/OT  - Follow up with your primary care physician for stroke risk factor modification. Recommend maintain blood pressure goal <130/80, diabetes with hemoglobin A1c goal below 6.5% and lipids with LDL cholesterol goal below 70 mg/dL.  - RTC in 3 months.  I spent more than 25 minutes of face to face time with the patient. Greater than 50% of time was spent in counseling and coordination of care. We have discussed about the dilemma in regarding to antiplatelet or anticoagulation use.   No orders of the defined types were placed in this encounter.    No orders of the defined types were placed in this encounter.    Patient Instructions  - will hold off any antiplatelet or  anticoagulation due to history of brain bleeding, and concerning for condition called cerebral amyloid angiopathy.  - although you have afib but you are not a good candidate for any blood thinners. - check BP at home - BP goal less than 140 - continue PT/OT  - Follow up with your primary care physician for stroke risk factor modification. Recommend maintain blood pressure goal <130/80, diabetes with hemoglobin A1c goal below 6.5% and lipids with LDL cholesterol goal below 70 mg/dL.  - follow up in 3 months.     [name], MD PhD [facility] [address], [place], [place] [address] [phone]

## 2015-05-30 DIAGNOSIS — D72829 Elevated white blood cell count, unspecified: Secondary | ICD-10-CM | POA: Diagnosis not present

## 2015-05-30 DIAGNOSIS — G40909 Epilepsy, unspecified, not intractable, without status epilepticus: Secondary | ICD-10-CM | POA: Diagnosis not present

## 2015-05-30 DIAGNOSIS — I4891 Unspecified atrial fibrillation: Secondary | ICD-10-CM | POA: Diagnosis not present

## 2015-05-30 DIAGNOSIS — I10 Essential (primary) hypertension: Secondary | ICD-10-CM | POA: Diagnosis not present

## 2015-05-30 DIAGNOSIS — M4806 Spinal stenosis, lumbar region: Secondary | ICD-10-CM | POA: Diagnosis not present

## 2015-05-30 DIAGNOSIS — I69322 Dysarthria following cerebral infarction: Secondary | ICD-10-CM | POA: Diagnosis not present

## 2015-05-30 DIAGNOSIS — I69354 Hemiplegia and hemiparesis following cerebral infarction affecting left non-dominant side: Secondary | ICD-10-CM | POA: Diagnosis not present

## 2015-05-31 DIAGNOSIS — D72829 Elevated white blood cell count, unspecified: Secondary | ICD-10-CM | POA: Diagnosis not present

## 2015-05-31 DIAGNOSIS — I4891 Unspecified atrial fibrillation: Secondary | ICD-10-CM | POA: Diagnosis not present

## 2015-05-31 DIAGNOSIS — I69354 Hemiplegia and hemiparesis following cerebral infarction affecting left non-dominant side: Secondary | ICD-10-CM | POA: Diagnosis not present

## 2015-05-31 DIAGNOSIS — G40909 Epilepsy, unspecified, not intractable, without status epilepticus: Secondary | ICD-10-CM | POA: Diagnosis not present

## 2015-05-31 DIAGNOSIS — I69322 Dysarthria following cerebral infarction: Secondary | ICD-10-CM | POA: Diagnosis not present

## 2015-05-31 DIAGNOSIS — I10 Essential (primary) hypertension: Secondary | ICD-10-CM | POA: Diagnosis not present

## 2015-05-31 DIAGNOSIS — M4806 Spinal stenosis, lumbar region: Secondary | ICD-10-CM | POA: Diagnosis not present

## 2015-06-03 ENCOUNTER — Telehealth: Payer: Self-pay | Admitting: Family Medicine

## 2015-06-03 DIAGNOSIS — I69322 Dysarthria following cerebral infarction: Secondary | ICD-10-CM | POA: Diagnosis not present

## 2015-06-03 DIAGNOSIS — I4891 Unspecified atrial fibrillation: Secondary | ICD-10-CM | POA: Diagnosis not present

## 2015-06-03 DIAGNOSIS — G40909 Epilepsy, unspecified, not intractable, without status epilepticus: Secondary | ICD-10-CM | POA: Diagnosis not present

## 2015-06-03 DIAGNOSIS — D72829 Elevated white blood cell count, unspecified: Secondary | ICD-10-CM | POA: Diagnosis not present

## 2015-06-03 DIAGNOSIS — M4806 Spinal stenosis, lumbar region: Secondary | ICD-10-CM | POA: Diagnosis not present

## 2015-06-03 DIAGNOSIS — I10 Essential (primary) hypertension: Secondary | ICD-10-CM | POA: Diagnosis not present

## 2015-06-03 DIAGNOSIS — I69354 Hemiplegia and hemiparesis following cerebral infarction affecting left non-dominant side: Secondary | ICD-10-CM | POA: Diagnosis not present

## 2015-06-03 NOTE — Telephone Encounter (Signed)
Called and left VO on Mallory vm.

## 2015-06-03 NOTE — Telephone Encounter (Signed)
Maleria from [facility] call to ask for verbal orders for  OT  2 times a week for 1 week this is an extension  Her bp today 150/88  She felt fine today and has no symptons   Stacy Gardner [phone]

## 2015-06-04 DIAGNOSIS — I10 Essential (primary) hypertension: Secondary | ICD-10-CM | POA: Diagnosis not present

## 2015-06-04 DIAGNOSIS — I69354 Hemiplegia and hemiparesis following cerebral infarction affecting left non-dominant side: Secondary | ICD-10-CM | POA: Diagnosis not present

## 2015-06-04 DIAGNOSIS — D72829 Elevated white blood cell count, unspecified: Secondary | ICD-10-CM | POA: Diagnosis not present

## 2015-06-04 DIAGNOSIS — I69322 Dysarthria following cerebral infarction: Secondary | ICD-10-CM | POA: Diagnosis not present

## 2015-06-04 DIAGNOSIS — I4891 Unspecified atrial fibrillation: Secondary | ICD-10-CM | POA: Diagnosis not present

## 2015-06-04 DIAGNOSIS — G40909 Epilepsy, unspecified, not intractable, without status epilepticus: Secondary | ICD-10-CM | POA: Diagnosis not present

## 2015-06-04 DIAGNOSIS — M4806 Spinal stenosis, lumbar region: Secondary | ICD-10-CM | POA: Diagnosis not present

## 2015-06-06 ENCOUNTER — Encounter: Payer: Self-pay | Admitting: Family Medicine

## 2015-06-06 ENCOUNTER — Ambulatory Visit (INDEPENDENT_AMBULATORY_CARE_PROVIDER_SITE_OTHER): Payer: Medicare Other | Admitting: Family Medicine

## 2015-06-06 VITALS — BP 160/78 | HR 79 | Temp 99.1°F | Wt 115.0 lb

## 2015-06-06 DIAGNOSIS — D72829 Elevated white blood cell count, unspecified: Secondary | ICD-10-CM | POA: Diagnosis not present

## 2015-06-06 DIAGNOSIS — G40909 Epilepsy, unspecified, not intractable, without status epilepticus: Secondary | ICD-10-CM | POA: Diagnosis not present

## 2015-06-06 DIAGNOSIS — Z23 Encounter for immunization: Secondary | ICD-10-CM

## 2015-06-06 DIAGNOSIS — I4891 Unspecified atrial fibrillation: Secondary | ICD-10-CM | POA: Diagnosis not present

## 2015-06-06 DIAGNOSIS — I69322 Dysarthria following cerebral infarction: Secondary | ICD-10-CM | POA: Diagnosis not present

## 2015-06-06 DIAGNOSIS — I10 Essential (primary) hypertension: Secondary | ICD-10-CM | POA: Diagnosis not present

## 2015-06-06 DIAGNOSIS — I69354 Hemiplegia and hemiparesis following cerebral infarction affecting left non-dominant side: Secondary | ICD-10-CM | POA: Diagnosis not present

## 2015-06-06 DIAGNOSIS — M4806 Spinal stenosis, lumbar region: Secondary | ICD-10-CM | POA: Diagnosis not present

## 2015-06-06 MED ORDER — AMLODIPINE BESYLATE 2.5 MG PO TABS: 2.5000 mg | ORAL_TABLET | Freq: Every day | ORAL | Status: DC

## 2015-06-06 NOTE — Assessment & Plan Note (Signed)
S: poorly controlled.  BP Readings from Last 3 Encounters:  06/06/15 160/78  05/29/15 139/72  04/23/15 122/80  Home readings- multiple readings since 10/9 177/76 166/85 139/89 177/95 136/73 149/84 133/70  144/70 Neurology recommends <130/80 on their note. Patient and family member state they were told 140/90 due to her age.  A/P:Continue current meds of losartan 100mg  but add amlodipine 2.5mg . Follow up 3 weeks. Bring home cuff this time along (has 1 manual and 1 automatic). Hopeful at goal but continue to titrate up as needed

## 2015-06-06 NOTE — Progress Notes (Signed)
Garret Reddish, MD  Subjective:  [name] is a 81 y.o. year old very pleasant female patient who presents for/with See problem oriented charting ROS- continues to have memory issues, frustrated by memory issues, no chest pain or shortness of breath, no falls  Past Medical History-  Patient Active Problem List   Diagnosis Date Noted  . Atrial fibrillation ([place]) 03/29/2015    Priority: High  . ICH (intracerebral hemorrhage) ([place])     Priority: High  . Essential hypertension 06/22/2006    Priority: Medium  . Dizzy spells 11/10/2014    Priority: Low  . SPINAL STENOSIS, CERVICAL 06/11/2010    Priority: Low  . Arthritis of right knee 06/22/2006    Priority: Low  . Osteopenia 06/22/2006    Priority: Low  . Nontraumatic cortical hemorrhage of right cerebral hemisphere ([place]) 05/29/2015  . Cerebral amyloid angiopathy 05/29/2015    Medications- reviewed and updated Current Outpatient Prescriptions  Medication Sig Dispense Refill  . losartan (COZAAR) 100 MG tablet Take 1 tablet (100 mg total) by mouth daily. 30 tablet 5  . Multiple Vitamin (MULTIVITAMIN) tablet Take 1 tablet by mouth daily.      Marland Kitchen amLODipine (NORVASC) 2.5 MG tablet Take 1 tablet (2.5 mg total) by mouth daily. 30 tablet 5   No current facility-administered medications for this visit.    Objective: BP 160/78 mmHg  Pulse 79  Temp(Src) 99.1 F (37.3 C)  Wt 115 lb (52.164 kg) Gen: NAD, resting comfortably CV: irregularly irregular no murmurs rubs or gallops Lungs: CTAB no crackles, wheeze, rhonchi Abdomen: soft/nontender/nondistended/normal bowel sounds. No rebound or guarding.  Ext: trace edema Skin: warm, dry Neuro: walks with walker, some forgetfulness  Assessment/Plan:  Essential hypertension S: poorly controlled.  BP Readings from Last 3 Encounters:  06/06/15 160/78  05/29/15 139/72  04/23/15 122/80  Home readings- multiple readings since  10/9 177/76 166/85 139/89 177/95 136/73 149/84 133/70  144/70 Neurology recommends <130/80 on their note. Patient and family member state they were told 140/90 due to her age.  A/P:Continue current meds of losartan 100mg  but add amlodipine 2.5mg . Follow up 3 weeks. Bring home cuff this time along (has 1 manual and 1 automatic). Hopeful at goal but continue to titrate up as needed  watch edema  Return precautions advised. 3 weeks planned.   Orders Placed This Encounter  Procedures  . Flu Vaccine QUAD 36+ mos IM    Meds ordered this encounter  Medications  . amLODipine (NORVASC) 2.5 MG tablet    Sig: Take 1 tablet (2.5 mg total) by mouth daily.    Dispense:  30 tablet    Refill:  5   >50% of 20 minute office visit was spent on counseling (dealing with stroke and sick husband ) and coordination of care

## 2015-06-06 NOTE — Patient Instructions (Addendum)
Flu shot received today.  Your blood pressure is above goal on home numbers. I would like for you to use a home cuff to check daily.Your goal is 120-140 for top #s and <90 for bottom number. If you note in the next few weeks that it is higher than our goal, see me sooner. Otherwise, see me in about 3 weeks. Bring your home cuff and your log of blood pressures with you to visit. Can bring manual cuff as well.   Start 2.5mg  of amlodipine in addition to losartan 100mg 

## 2015-06-07 DIAGNOSIS — G40909 Epilepsy, unspecified, not intractable, without status epilepticus: Secondary | ICD-10-CM | POA: Diagnosis not present

## 2015-06-07 DIAGNOSIS — D72829 Elevated white blood cell count, unspecified: Secondary | ICD-10-CM | POA: Diagnosis not present

## 2015-06-07 DIAGNOSIS — M4806 Spinal stenosis, lumbar region: Secondary | ICD-10-CM | POA: Diagnosis not present

## 2015-06-07 DIAGNOSIS — I10 Essential (primary) hypertension: Secondary | ICD-10-CM | POA: Diagnosis not present

## 2015-06-07 DIAGNOSIS — I4891 Unspecified atrial fibrillation: Secondary | ICD-10-CM | POA: Diagnosis not present

## 2015-06-07 DIAGNOSIS — I69354 Hemiplegia and hemiparesis following cerebral infarction affecting left non-dominant side: Secondary | ICD-10-CM | POA: Diagnosis not present

## 2015-06-07 DIAGNOSIS — I69322 Dysarthria following cerebral infarction: Secondary | ICD-10-CM | POA: Diagnosis not present

## 2015-06-11 ENCOUNTER — Telehealth: Payer: Self-pay | Admitting: Family Medicine

## 2015-06-11 DIAGNOSIS — I69354 Hemiplegia and hemiparesis following cerebral infarction affecting left non-dominant side: Secondary | ICD-10-CM | POA: Diagnosis not present

## 2015-06-11 DIAGNOSIS — I69322 Dysarthria following cerebral infarction: Secondary | ICD-10-CM | POA: Diagnosis not present

## 2015-06-11 DIAGNOSIS — D72829 Elevated white blood cell count, unspecified: Secondary | ICD-10-CM | POA: Diagnosis not present

## 2015-06-11 DIAGNOSIS — I10 Essential (primary) hypertension: Secondary | ICD-10-CM | POA: Diagnosis not present

## 2015-06-11 DIAGNOSIS — M4806 Spinal stenosis, lumbar region: Secondary | ICD-10-CM | POA: Diagnosis not present

## 2015-06-11 DIAGNOSIS — G40909 Epilepsy, unspecified, not intractable, without status epilepticus: Secondary | ICD-10-CM | POA: Diagnosis not present

## 2015-06-11 DIAGNOSIS — I4891 Unspecified atrial fibrillation: Secondary | ICD-10-CM | POA: Diagnosis not present

## 2015-06-11 NOTE — Telephone Encounter (Signed)
[name] would like one additional home health aid visit for ADL for  this patient. Verbal is ok

## 2015-06-12 ENCOUNTER — Encounter: Payer: Medicare Other | Admitting: Physical Medicine & Rehabilitation

## 2015-06-12 NOTE — Telephone Encounter (Signed)
Called and lm on Mallory vm with verbal order.

## 2015-06-13 DIAGNOSIS — D72829 Elevated white blood cell count, unspecified: Secondary | ICD-10-CM | POA: Diagnosis not present

## 2015-06-13 DIAGNOSIS — G40909 Epilepsy, unspecified, not intractable, without status epilepticus: Secondary | ICD-10-CM | POA: Diagnosis not present

## 2015-06-13 DIAGNOSIS — M4806 Spinal stenosis, lumbar region: Secondary | ICD-10-CM | POA: Diagnosis not present

## 2015-06-13 DIAGNOSIS — I4891 Unspecified atrial fibrillation: Secondary | ICD-10-CM | POA: Diagnosis not present

## 2015-06-13 DIAGNOSIS — I69354 Hemiplegia and hemiparesis following cerebral infarction affecting left non-dominant side: Secondary | ICD-10-CM | POA: Diagnosis not present

## 2015-06-13 DIAGNOSIS — I10 Essential (primary) hypertension: Secondary | ICD-10-CM | POA: Diagnosis not present

## 2015-06-13 DIAGNOSIS — I69322 Dysarthria following cerebral infarction: Secondary | ICD-10-CM | POA: Diagnosis not present

## 2015-06-14 ENCOUNTER — Telehealth: Payer: Self-pay | Admitting: Family Medicine

## 2015-06-14 NOTE — Telephone Encounter (Signed)
[name] would like verbal order to extended OT for twice a wk for 3 wks also to extended home health aid for twice a wk for 3 wks

## 2015-06-14 NOTE — Telephone Encounter (Signed)
See below

## 2015-06-15 NOTE — Telephone Encounter (Signed)
Yes please give verbal order 

## 2015-06-17 ENCOUNTER — Telehealth: Payer: Self-pay | Admitting: Physical Medicine & Rehabilitation

## 2015-06-17 DIAGNOSIS — G40909 Epilepsy, unspecified, not intractable, without status epilepticus: Secondary | ICD-10-CM | POA: Diagnosis not present

## 2015-06-17 DIAGNOSIS — D72829 Elevated white blood cell count, unspecified: Secondary | ICD-10-CM | POA: Diagnosis not present

## 2015-06-17 DIAGNOSIS — I10 Essential (primary) hypertension: Secondary | ICD-10-CM | POA: Diagnosis not present

## 2015-06-17 DIAGNOSIS — M4806 Spinal stenosis, lumbar region: Secondary | ICD-10-CM | POA: Diagnosis not present

## 2015-06-17 DIAGNOSIS — I69354 Hemiplegia and hemiparesis following cerebral infarction affecting left non-dominant side: Secondary | ICD-10-CM | POA: Diagnosis not present

## 2015-06-17 DIAGNOSIS — I69322 Dysarthria following cerebral infarction: Secondary | ICD-10-CM | POA: Diagnosis not present

## 2015-06-17 DIAGNOSIS — I4891 Unspecified atrial fibrillation: Secondary | ICD-10-CM | POA: Diagnosis not present

## 2015-06-17 NOTE — Telephone Encounter (Signed)
Joann PT with [facility] went out for possible discharge of patient, but patient is still having balance issues and would like to recert patient 1w1 and 2w8.  Please call with verbal [phone].

## 2015-06-17 NOTE — Telephone Encounter (Signed)
Verbal order given to Mallory. Thanks!

## 2015-06-18 DIAGNOSIS — M4806 Spinal stenosis, lumbar region: Secondary | ICD-10-CM | POA: Diagnosis not present

## 2015-06-18 DIAGNOSIS — I69322 Dysarthria following cerebral infarction: Secondary | ICD-10-CM | POA: Diagnosis not present

## 2015-06-18 DIAGNOSIS — I4891 Unspecified atrial fibrillation: Secondary | ICD-10-CM | POA: Diagnosis not present

## 2015-06-18 DIAGNOSIS — G40909 Epilepsy, unspecified, not intractable, without status epilepticus: Secondary | ICD-10-CM | POA: Diagnosis not present

## 2015-06-18 DIAGNOSIS — D72829 Elevated white blood cell count, unspecified: Secondary | ICD-10-CM | POA: Diagnosis not present

## 2015-06-18 DIAGNOSIS — I10 Essential (primary) hypertension: Secondary | ICD-10-CM | POA: Diagnosis not present

## 2015-06-18 DIAGNOSIS — I69354 Hemiplegia and hemiparesis following cerebral infarction affecting left non-dominant side: Secondary | ICD-10-CM | POA: Diagnosis not present

## 2015-06-18 NOTE — Telephone Encounter (Signed)
Verbal order given for PT. 

## 2015-06-20 ENCOUNTER — Telehealth: Payer: Self-pay | Admitting: Family Medicine

## 2015-06-20 DIAGNOSIS — I10 Essential (primary) hypertension: Secondary | ICD-10-CM | POA: Diagnosis not present

## 2015-06-20 DIAGNOSIS — I69354 Hemiplegia and hemiparesis following cerebral infarction affecting left non-dominant side: Secondary | ICD-10-CM | POA: Diagnosis not present

## 2015-06-20 DIAGNOSIS — M4806 Spinal stenosis, lumbar region: Secondary | ICD-10-CM | POA: Diagnosis not present

## 2015-06-20 DIAGNOSIS — I69322 Dysarthria following cerebral infarction: Secondary | ICD-10-CM | POA: Diagnosis not present

## 2015-06-20 DIAGNOSIS — G40909 Epilepsy, unspecified, not intractable, without status epilepticus: Secondary | ICD-10-CM | POA: Diagnosis not present

## 2015-06-20 DIAGNOSIS — D72829 Elevated white blood cell count, unspecified: Secondary | ICD-10-CM | POA: Diagnosis not present

## 2015-06-20 DIAGNOSIS — I4891 Unspecified atrial fibrillation: Secondary | ICD-10-CM | POA: Diagnosis not present

## 2015-06-20 NOTE — Telephone Encounter (Signed)
dont see she has an appointment set up  From last note: "Neurology recommends <130/80 on their note. Patient and family member state they were told 140/90 due to her age.  A/P:Continue current meds of losartan 100mg  but add amlodipine 2.5mg . Follow up 3 weeks. Bring home cuff this time along (has 1 manual and 1 automatic). Hopeful at goal but continue to titrate up as needed"   Is she taking the amlodipine? Can you help her schedule a visit with me?

## 2015-06-20 NOTE — Telephone Encounter (Signed)
Pt notified and aware to log BP readings and to bring them with her on 07/03/15. She is currently taking her Amlodipine.

## 2015-06-20 NOTE — Telephone Encounter (Signed)
Mallory/ with [facility] home health reports :  Pt bp was elevated to 160/90 Some confusion, but that is normal for her.

## 2015-06-20 NOTE — Telephone Encounter (Signed)
FYI

## 2015-06-21 ENCOUNTER — Encounter: Payer: Self-pay | Admitting: Physical Medicine & Rehabilitation

## 2015-06-21 ENCOUNTER — Encounter: Payer: Medicare Other | Attending: Physical Medicine & Rehabilitation | Admitting: Physical Medicine & Rehabilitation

## 2015-06-21 VITALS — BP 148/92 | HR 75 | Resp 14

## 2015-06-21 DIAGNOSIS — M129 Arthropathy, unspecified: Secondary | ICD-10-CM | POA: Diagnosis not present

## 2015-06-21 DIAGNOSIS — I610 Nontraumatic intracerebral hemorrhage in hemisphere, subcortical: Secondary | ICD-10-CM | POA: Insufficient documentation

## 2015-06-21 DIAGNOSIS — I10 Essential (primary) hypertension: Secondary | ICD-10-CM | POA: Diagnosis not present

## 2015-06-21 DIAGNOSIS — I4891 Unspecified atrial fibrillation: Secondary | ICD-10-CM | POA: Diagnosis not present

## 2015-06-21 DIAGNOSIS — D72829 Elevated white blood cell count, unspecified: Secondary | ICD-10-CM

## 2015-06-21 DIAGNOSIS — G40909 Epilepsy, unspecified, not intractable, without status epilepticus: Secondary | ICD-10-CM

## 2015-06-21 DIAGNOSIS — G252 Other specified forms of tremor: Secondary | ICD-10-CM | POA: Insufficient documentation

## 2015-06-21 DIAGNOSIS — M8580 Other specified disorders of bone density and structure, unspecified site: Secondary | ICD-10-CM | POA: Diagnosis not present

## 2015-06-21 DIAGNOSIS — I611 Nontraumatic intracerebral hemorrhage in hemisphere, cortical: Secondary | ICD-10-CM | POA: Diagnosis not present

## 2015-06-21 DIAGNOSIS — M1711 Unilateral primary osteoarthritis, right knee: Secondary | ICD-10-CM

## 2015-06-21 DIAGNOSIS — I69354 Hemiplegia and hemiparesis following cerebral infarction affecting left non-dominant side: Secondary | ICD-10-CM | POA: Diagnosis not present

## 2015-06-21 DIAGNOSIS — Z8673 Personal history of transient ischemic attack (TIA), and cerebral infarction without residual deficits: Secondary | ICD-10-CM | POA: Diagnosis not present

## 2015-06-21 DIAGNOSIS — Z79899 Other long term (current) drug therapy: Secondary | ICD-10-CM | POA: Diagnosis not present

## 2015-06-21 DIAGNOSIS — I69322 Dysarthria following cerebral infarction: Secondary | ICD-10-CM | POA: Diagnosis not present

## 2015-06-21 NOTE — Patient Instructions (Addendum)
CONSIDER TRIAL OF PROPRANOLOL FOR TREMORS----POTENTIALLY 10-20MG  TWICE DAILY-----COULD CONSIDER STOPPING AMLODIPINE IF WE TRY THIS MEDICATION.  RECOMMEND WALKER USE STILL AROUND HOME, ESPECIALLY WHEN YOU ARE BY YOURSELF   PLEASE CALL ME WITH ANY PROBLEMS OR QUESTIONS (#[phone]).  HAVE A GOOD DAY!

## 2015-06-21 NOTE — Progress Notes (Signed)
Subjective:    Patient ID: [name], female    DOB: 16-Dec-1917, 81 y.o.   MRN: [id number]  HPI   Mrs. Osberg is here in follow up of her right temporal-parietal IPH and inpatient rehab admission. She has been busy with [facility] therapies. They have been working on balance and safety.   Mrs. Heyliger describes ongoing "shaking" in her hands which usually happens when she uses her hands for fine motor tasks. She has problems sewing and replacing the batteries in her husband's hearing aids . The tremors are in both hands. Daughter notes that there may have been some early tremors prior to the [place]  Her walking has improved. She is using a rolling walker for balance outside the house only now. She has come out of the knee sleeve as her knee pain improved with therapy and strengthening exercises. There have not been any falls.   Her BP has been a little elevated---her PCP just started her on norvasc 2.5mg  daily.   Pain Inventory Average Pain 1 Pain Right Now 1 My pain is aching  In the last 24 hours, has pain interfered with the following? General activity 4 Relation with others 0 Enjoyment of life 0 What TIME of day is your pain at its worst? morning Sleep (in general) Fair  Pain is worse with: bending Pain improves with: rest and therapy/exercise Relief from Meds: 5  Mobility walk with assistance use a walker ability to climb steps?  yes do you drive?  no  Function not employed: date last employed NA I need assistance with the following:  shopping Do you have any goals in this area?  yes  Neuro/Psych confusion  Prior Studies Any changes since last visit?  no  Physicians involved in your care Any changes since last visit?  no   Family History  Problem Relation Age of Onset  . Cancer Daughter     breast   Social History   Social History  . Marital Status: Married    Spouse Name: N/A  . Number of Children: N/A  . Years of Education: N/A   Social History Main  Topics  . Smoking status: Never Smoker   . Smokeless tobacco: Never Used  . Alcohol Use: 0.6 oz/week    1 Glasses of wine per week     Comment: 1 glass with dinner  . Drug Use: No  . Sexual Activity: Not Asked   Other Topics Concern  . None   Social History Narrative   Married (husband Tom age 82 in [place] practice) 62. 2 [place] oldest daughter to breast cancer. 4 grandkids.       Husband and patient live with daughter.       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       Hobbies: reading, had been quilting, enjoys cooking/baking.       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    Full Code.             Past Surgical History  Procedure Laterality Date  . Cataract extraction     Past Medical History  Diagnosis Date  . Hypertension   . Osteopenia   . Stroke (HCC)    BP 148/92 mmHg  Pulse 75  Resp 14  SpO2 98%  Opioid Risk Score:   Fall Risk Score:  `1  Depression screen PHQ 2/9  Depression screen [facility] 2/9 05/29/2015 01/04/2015 10/30/2013  Decreased Interest 0 0 0  Down, Depressed, Hopeless 0  0 0  PHQ - 2 Score 0 0 0     Review of Systems  Psychiatric/Behavioral: Positive for confusion.  All other systems reviewed and are negative.      Objective:   Physical Exam  HEENT: normal Cardio: IRRR and no murmur Resp: CTA B/L and unlabored GI: BS positive and NT, ND Extremity: No Edema Skin: Intact Neuro: Flat, Abnormal Sensory parathesia to LT in RLE and 4+/5 Motor BUE and BLE, oriented to person and place. Remembered me when i entered the room. Perseverated a bit on her early hospital admission and not being able to recall what happened. Is able to answer basic biographical inforamtion and recalls recent events at home. She has a fine motor intention tremor of both hands (equally) with finger to nose and finger to finger testing. i saw no lower ext tremor. Her balance was good with the walker with adequate weight shift. She tended  to lean and fall a bit to the right without her walker. Musc/Skel: Right knee tenderness improved. She walks with better weight shift to the right but still displays a little antalgia. Low back with minimal tenderness upon palpation. Gen NAD   Assessment/Plan: 1. Functional deficits secondary to right temporal intraparenchymal hemorrhage  -recommend continued use of the walker when she's alone given fall risk  -continue HH therapies to address coordination and balance  2. Pain Management: Voltaren gel 4 times a day to right knee, scheduled ice, no current knee pain -neoprene knee sleeve for right knee support prn  3 Neuropsych: This patient is capable of making decisions on her own behalf. 4. Skin/Wound Care: Routine skin checks, no breakdown but at risk--- continue oob, nutritional efforts 5. Intention tremor:  -?propranolol trial---they will discuss with PCP--currently on norvasc which could be held as we try propranolol  -consider weighted utensils (therapy couls work on these with her) 8. Hypertension. Cozaar 100 mg daily. norvasc (see above)  Thirty minutes of face to face patient care time were spent during this visit. All questions were encouraged and answered. Follow up in 6 weeks.

## 2015-06-24 DIAGNOSIS — D72829 Elevated white blood cell count, unspecified: Secondary | ICD-10-CM | POA: Diagnosis not present

## 2015-06-24 DIAGNOSIS — M4806 Spinal stenosis, lumbar region: Secondary | ICD-10-CM | POA: Diagnosis not present

## 2015-06-24 DIAGNOSIS — I69354 Hemiplegia and hemiparesis following cerebral infarction affecting left non-dominant side: Secondary | ICD-10-CM | POA: Diagnosis not present

## 2015-06-24 DIAGNOSIS — G40909 Epilepsy, unspecified, not intractable, without status epilepticus: Secondary | ICD-10-CM | POA: Diagnosis not present

## 2015-06-24 DIAGNOSIS — I69322 Dysarthria following cerebral infarction: Secondary | ICD-10-CM | POA: Diagnosis not present

## 2015-06-24 DIAGNOSIS — I10 Essential (primary) hypertension: Secondary | ICD-10-CM | POA: Diagnosis not present

## 2015-06-24 DIAGNOSIS — I4891 Unspecified atrial fibrillation: Secondary | ICD-10-CM | POA: Diagnosis not present

## 2015-06-25 DIAGNOSIS — I69354 Hemiplegia and hemiparesis following cerebral infarction affecting left non-dominant side: Secondary | ICD-10-CM | POA: Diagnosis not present

## 2015-06-25 DIAGNOSIS — M4806 Spinal stenosis, lumbar region: Secondary | ICD-10-CM | POA: Diagnosis not present

## 2015-06-25 DIAGNOSIS — I4891 Unspecified atrial fibrillation: Secondary | ICD-10-CM | POA: Diagnosis not present

## 2015-06-25 DIAGNOSIS — G40909 Epilepsy, unspecified, not intractable, without status epilepticus: Secondary | ICD-10-CM | POA: Diagnosis not present

## 2015-06-25 DIAGNOSIS — I10 Essential (primary) hypertension: Secondary | ICD-10-CM | POA: Diagnosis not present

## 2015-06-25 DIAGNOSIS — I69322 Dysarthria following cerebral infarction: Secondary | ICD-10-CM | POA: Diagnosis not present

## 2015-06-25 DIAGNOSIS — D72829 Elevated white blood cell count, unspecified: Secondary | ICD-10-CM | POA: Diagnosis not present

## 2015-06-28 DIAGNOSIS — I4891 Unspecified atrial fibrillation: Secondary | ICD-10-CM | POA: Diagnosis not present

## 2015-06-28 DIAGNOSIS — I69354 Hemiplegia and hemiparesis following cerebral infarction affecting left non-dominant side: Secondary | ICD-10-CM | POA: Diagnosis not present

## 2015-06-28 DIAGNOSIS — D72829 Elevated white blood cell count, unspecified: Secondary | ICD-10-CM | POA: Diagnosis not present

## 2015-06-28 DIAGNOSIS — M4806 Spinal stenosis, lumbar region: Secondary | ICD-10-CM | POA: Diagnosis not present

## 2015-06-28 DIAGNOSIS — I69322 Dysarthria following cerebral infarction: Secondary | ICD-10-CM | POA: Diagnosis not present

## 2015-06-28 DIAGNOSIS — I10 Essential (primary) hypertension: Secondary | ICD-10-CM | POA: Diagnosis not present

## 2015-06-28 DIAGNOSIS — G40909 Epilepsy, unspecified, not intractable, without status epilepticus: Secondary | ICD-10-CM | POA: Diagnosis not present

## 2015-07-01 ENCOUNTER — Telehealth: Payer: Self-pay | Admitting: Family Medicine

## 2015-07-01 DIAGNOSIS — I69354 Hemiplegia and hemiparesis following cerebral infarction affecting left non-dominant side: Secondary | ICD-10-CM | POA: Diagnosis not present

## 2015-07-01 DIAGNOSIS — I69322 Dysarthria following cerebral infarction: Secondary | ICD-10-CM | POA: Diagnosis not present

## 2015-07-01 DIAGNOSIS — I4891 Unspecified atrial fibrillation: Secondary | ICD-10-CM | POA: Diagnosis not present

## 2015-07-01 DIAGNOSIS — M4806 Spinal stenosis, lumbar region: Secondary | ICD-10-CM | POA: Diagnosis not present

## 2015-07-01 DIAGNOSIS — I10 Essential (primary) hypertension: Secondary | ICD-10-CM | POA: Diagnosis not present

## 2015-07-01 DIAGNOSIS — D72829 Elevated white blood cell count, unspecified: Secondary | ICD-10-CM | POA: Diagnosis not present

## 2015-07-01 DIAGNOSIS — G40909 Epilepsy, unspecified, not intractable, without status epilepticus: Secondary | ICD-10-CM | POA: Diagnosis not present

## 2015-07-01 NOTE — Telephone Encounter (Signed)
Returned Mallory's call and provided VO.

## 2015-07-01 NOTE — Telephone Encounter (Signed)
Mallory/ [facility] needs new verbal orders to extend occupational therapy for 2 x week for 3 weeks  Then extend home health aid orders for 2 x week for 6 weeks.

## 2015-07-02 DIAGNOSIS — D72829 Elevated white blood cell count, unspecified: Secondary | ICD-10-CM | POA: Diagnosis not present

## 2015-07-02 DIAGNOSIS — I69354 Hemiplegia and hemiparesis following cerebral infarction affecting left non-dominant side: Secondary | ICD-10-CM | POA: Diagnosis not present

## 2015-07-02 DIAGNOSIS — I10 Essential (primary) hypertension: Secondary | ICD-10-CM | POA: Diagnosis not present

## 2015-07-02 DIAGNOSIS — M4806 Spinal stenosis, lumbar region: Secondary | ICD-10-CM | POA: Diagnosis not present

## 2015-07-02 DIAGNOSIS — G40909 Epilepsy, unspecified, not intractable, without status epilepticus: Secondary | ICD-10-CM | POA: Diagnosis not present

## 2015-07-02 DIAGNOSIS — I69322 Dysarthria following cerebral infarction: Secondary | ICD-10-CM | POA: Diagnosis not present

## 2015-07-02 DIAGNOSIS — I4891 Unspecified atrial fibrillation: Secondary | ICD-10-CM | POA: Diagnosis not present

## 2015-07-03 ENCOUNTER — Ambulatory Visit (INDEPENDENT_AMBULATORY_CARE_PROVIDER_SITE_OTHER): Payer: Medicare Other | Admitting: Family Medicine

## 2015-07-03 ENCOUNTER — Encounter: Payer: Self-pay | Admitting: Family Medicine

## 2015-07-03 ENCOUNTER — Telehealth: Payer: Self-pay | Admitting: Family Medicine

## 2015-07-03 VITALS — BP 140/80 | HR 62 | Temp 98.4°F | Wt 112.0 lb

## 2015-07-03 DIAGNOSIS — I10 Essential (primary) hypertension: Secondary | ICD-10-CM

## 2015-07-03 DIAGNOSIS — I610 Nontraumatic intracerebral hemorrhage in hemisphere, subcortical: Secondary | ICD-10-CM

## 2015-07-03 NOTE — Assessment & Plan Note (Signed)
S: mild poor control on losartan 100mg , amlodipine 2.5 mg. Home readings last month when added medicine over 16 readings 25% of time >140. 75% of time at goal. Seems to be worse with people in and out of home. Home cuff verified today.  BP Readings from Last 3 Encounters:  07/03/15 140/80  06/21/15 148/92  06/06/15 160/78  A/P:Continue current meds:  Will check again over next 2 weeks and if BP more than 10-15% of time elevated- will increase amlodipine to 5mg . Patient high risk for falls so going slow to avoid orthostasis but also want to avoid repeat CVA

## 2015-07-03 NOTE — Patient Instructions (Addendum)
Drop off a list of blood pressures for next 2 weeks. If more than 10-15% of time over 140, will likely increase amlodipine to 5mg   Glad to hear about all the progress  We need to reduce your stress load though.   When the in and out of the house slows down in 4 weeks- update me a week or two later  We may need to take additional steps to reduce stress level at that time

## 2015-07-03 NOTE — Progress Notes (Signed)
Garret Reddish, MD  Subjective:  [name] is a 81 y.o. year old very pleasant female patient who presents for/with See problem oriented charting ROS- No chest pain or shortness of breath. No  blurry vision. Does have some forgetfullness   Past Medical History-  Patient Active Problem List   Diagnosis Date Noted  . Atrial fibrillation ([place]) 03/29/2015    Priority: High  . ICH (intracerebral hemorrhage) ([place])     Priority: High  . Essential hypertension 06/22/2006    Priority: Medium  . Dizzy spells 11/10/2014    Priority: Low  . SPINAL STENOSIS, CERVICAL 06/11/2010    Priority: Low  . Arthritis of right knee 06/22/2006    Priority: Low  . Osteopenia 06/22/2006    Priority: Low  . Intention tremor 06/21/2015  . 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 Cerebral amyloid angiopathy 05/29/2015    Medications- reviewed and updated Current Outpatient Prescriptions  Medication Sig Dispense Refill  . amLODipine (NORVASC) 2.5 MG tablet Take 1 tablet (2.5 mg total) by mouth daily. 30 tablet 5  . losartan (COZAAR) 100 MG tablet Take 1 tablet (100 mg total) by mouth daily. 30 tablet 5  . Multiple Vitamin (MULTIVITAMIN) tablet Take 1 tablet by mouth daily.       No current facility-administered medications for this visit.    Objective: BP 140/80 mmHg  Pulse 62  Temp(Src) 98.4 F (36.9 C)  Wt 112 lb (50.803 kg) Gen: NAD, resting comfortably CV: RRR no murmurs rubs or gallops Lungs: CTAB no crackles, wheeze, rhonchi Abdomen: soft/nontender/nondistended/normal bowel sounds. No rebound or guarding.  Ext: no edema or cyanosis Skin: warm, dry, no rash Neuro: hard of hearing  Assessment/Plan:  Essential hypertension S: mild poor control on losartan 100mg , amlodipine 2.5 mg. Home readings last month when added medicine over 16 readings 25% of time >140. 75% of time at goal. Seems to be worse with people in and out of home. Home cuff  verified today.  BP Readings from Last 3 Encounters:  07/03/15 140/80  06/21/15 148/92  06/06/15 160/78  A/P:Continue current meds:  Will check again over next 2 weeks and if BP more than 10-15% of time elevated- will increase amlodipine to 5mg . Patient high risk for falls so going slow to avoid orthostasis but also want to avoid repeat CVA   ICH (intracerebral hemorrhage) S: recovering. Cooking meals, doing laundry, up and down stairs unassisted, going without walkerA lot of issues with assistance and people in and out of home- seems to confuse her and would be helped by regular schedule. Cleaning woman and daughter come every Thursday- reorganizing things. Mallory OT from [facility] coming twice a week, then [facility] twice a week. St stopped. Husband also a stressor. Hearing could be contributing-Going to get hearing aids A/P: we discussed trying to lower stress loads to help with recovery. Hopefully when PT/OT/ST all out of house things will calm down and we can get a new baseline. May need some additional support in home. Had tried agency before- may use private   2 week drop off BP 5 week to update me on confusion issues/overload. We will try to reduce stress load

## 2015-07-03 NOTE — Assessment & Plan Note (Signed)
S: recovering. Cooking meals, doing laundry, up and down stairs unassisted, going without walkerA lot of issues with assistance and people in and out of home- seems to confuse her and would be helped by regular schedule. Cleaning woman and daughter come every Thursday- reorganizing things. Mallory OT from [facility] coming twice a week, then [facility] twice a week. St stopped. Husband also a stressor. Hearing could be contributing-Going to get hearing aids A/P: we discussed trying to lower stress loads to help with recovery. Hopefully when PT/OT/ST all out of house things will calm down and we can get a new baseline. May need some additional support in home. Had tried agency before- may use private

## 2015-07-03 NOTE — Telephone Encounter (Signed)
Mallory OT from [facility]  is recommending the patient  Reside in assistant living due to safety concern and  Unorganized thinking and confusion. Pt has an appt today at 10:45 am

## 2015-07-03 NOTE — Telephone Encounter (Signed)
Printed and given to Dr. Yong Channel to discuss at [facility].

## 2015-07-04 DIAGNOSIS — D72829 Elevated white blood cell count, unspecified: Secondary | ICD-10-CM | POA: Diagnosis not present

## 2015-07-04 DIAGNOSIS — M4806 Spinal stenosis, lumbar region: Secondary | ICD-10-CM | POA: Diagnosis not present

## 2015-07-04 DIAGNOSIS — I69322 Dysarthria following cerebral infarction: Secondary | ICD-10-CM | POA: Diagnosis not present

## 2015-07-04 DIAGNOSIS — I4891 Unspecified atrial fibrillation: Secondary | ICD-10-CM | POA: Diagnosis not present

## 2015-07-04 DIAGNOSIS — G40909 Epilepsy, unspecified, not intractable, without status epilepticus: Secondary | ICD-10-CM | POA: Diagnosis not present

## 2015-07-04 DIAGNOSIS — I10 Essential (primary) hypertension: Secondary | ICD-10-CM | POA: Diagnosis not present

## 2015-07-04 DIAGNOSIS — I69354 Hemiplegia and hemiparesis following cerebral infarction affecting left non-dominant side: Secondary | ICD-10-CM | POA: Diagnosis not present

## 2015-07-08 DIAGNOSIS — M4806 Spinal stenosis, lumbar region: Secondary | ICD-10-CM | POA: Diagnosis not present

## 2015-07-08 DIAGNOSIS — I10 Essential (primary) hypertension: Secondary | ICD-10-CM | POA: Diagnosis not present

## 2015-07-08 DIAGNOSIS — I69354 Hemiplegia and hemiparesis following cerebral infarction affecting left non-dominant side: Secondary | ICD-10-CM | POA: Diagnosis not present

## 2015-07-08 DIAGNOSIS — I4891 Unspecified atrial fibrillation: Secondary | ICD-10-CM | POA: Diagnosis not present

## 2015-07-08 DIAGNOSIS — I69322 Dysarthria following cerebral infarction: Secondary | ICD-10-CM | POA: Diagnosis not present

## 2015-07-08 DIAGNOSIS — D72829 Elevated white blood cell count, unspecified: Secondary | ICD-10-CM | POA: Diagnosis not present

## 2015-07-08 DIAGNOSIS — G40909 Epilepsy, unspecified, not intractable, without status epilepticus: Secondary | ICD-10-CM | POA: Diagnosis not present

## 2015-07-09 DIAGNOSIS — D72829 Elevated white blood cell count, unspecified: Secondary | ICD-10-CM | POA: Diagnosis not present

## 2015-07-09 DIAGNOSIS — I69322 Dysarthria following cerebral infarction: Secondary | ICD-10-CM | POA: Diagnosis not present

## 2015-07-09 DIAGNOSIS — I69354 Hemiplegia and hemiparesis following cerebral infarction affecting left non-dominant side: Secondary | ICD-10-CM | POA: Diagnosis not present

## 2015-07-09 DIAGNOSIS — M4806 Spinal stenosis, lumbar region: Secondary | ICD-10-CM | POA: Diagnosis not present

## 2015-07-09 DIAGNOSIS — I4891 Unspecified atrial fibrillation: Secondary | ICD-10-CM | POA: Diagnosis not present

## 2015-07-09 DIAGNOSIS — G40909 Epilepsy, unspecified, not intractable, without status epilepticus: Secondary | ICD-10-CM | POA: Diagnosis not present

## 2015-07-09 DIAGNOSIS — I10 Essential (primary) hypertension: Secondary | ICD-10-CM | POA: Diagnosis not present

## 2015-07-10 DIAGNOSIS — I10 Essential (primary) hypertension: Secondary | ICD-10-CM | POA: Diagnosis not present

## 2015-07-10 DIAGNOSIS — I69354 Hemiplegia and hemiparesis following cerebral infarction affecting left non-dominant side: Secondary | ICD-10-CM | POA: Diagnosis not present

## 2015-07-10 DIAGNOSIS — M4806 Spinal stenosis, lumbar region: Secondary | ICD-10-CM | POA: Diagnosis not present

## 2015-07-10 DIAGNOSIS — H353132 Nonexudative age-related macular degeneration, bilateral, intermediate dry stage: Secondary | ICD-10-CM | POA: Diagnosis not present

## 2015-07-10 DIAGNOSIS — H35363 Drusen (degenerative) of macula, bilateral: Secondary | ICD-10-CM | POA: Diagnosis not present

## 2015-07-10 DIAGNOSIS — I69322 Dysarthria following cerebral infarction: Secondary | ICD-10-CM | POA: Diagnosis not present

## 2015-07-10 DIAGNOSIS — D72829 Elevated white blood cell count, unspecified: Secondary | ICD-10-CM | POA: Diagnosis not present

## 2015-07-10 DIAGNOSIS — G40909 Epilepsy, unspecified, not intractable, without status epilepticus: Secondary | ICD-10-CM | POA: Diagnosis not present

## 2015-07-10 DIAGNOSIS — I4891 Unspecified atrial fibrillation: Secondary | ICD-10-CM | POA: Diagnosis not present

## 2015-07-10 LAB — HM DIABETES EYE EXAM

## 2015-07-11 ENCOUNTER — Other Ambulatory Visit: Payer: Self-pay | Admitting: Family Medicine

## 2015-07-11 DIAGNOSIS — I69354 Hemiplegia and hemiparesis following cerebral infarction affecting left non-dominant side: Secondary | ICD-10-CM | POA: Diagnosis not present

## 2015-07-11 DIAGNOSIS — G40909 Epilepsy, unspecified, not intractable, without status epilepticus: Secondary | ICD-10-CM | POA: Diagnosis not present

## 2015-07-11 DIAGNOSIS — D72829 Elevated white blood cell count, unspecified: Secondary | ICD-10-CM | POA: Diagnosis not present

## 2015-07-11 DIAGNOSIS — I10 Essential (primary) hypertension: Secondary | ICD-10-CM | POA: Diagnosis not present

## 2015-07-11 DIAGNOSIS — I4891 Unspecified atrial fibrillation: Secondary | ICD-10-CM | POA: Diagnosis not present

## 2015-07-11 DIAGNOSIS — I69322 Dysarthria following cerebral infarction: Secondary | ICD-10-CM | POA: Diagnosis not present

## 2015-07-11 DIAGNOSIS — M4806 Spinal stenosis, lumbar region: Secondary | ICD-10-CM | POA: Diagnosis not present

## 2015-07-15 DIAGNOSIS — I10 Essential (primary) hypertension: Secondary | ICD-10-CM | POA: Diagnosis not present

## 2015-07-15 DIAGNOSIS — M4806 Spinal stenosis, lumbar region: Secondary | ICD-10-CM | POA: Diagnosis not present

## 2015-07-15 DIAGNOSIS — D72829 Elevated white blood cell count, unspecified: Secondary | ICD-10-CM | POA: Diagnosis not present

## 2015-07-15 DIAGNOSIS — G40909 Epilepsy, unspecified, not intractable, without status epilepticus: Secondary | ICD-10-CM | POA: Diagnosis not present

## 2015-07-15 DIAGNOSIS — I69354 Hemiplegia and hemiparesis following cerebral infarction affecting left non-dominant side: Secondary | ICD-10-CM | POA: Diagnosis not present

## 2015-07-15 DIAGNOSIS — I4891 Unspecified atrial fibrillation: Secondary | ICD-10-CM | POA: Diagnosis not present

## 2015-07-15 DIAGNOSIS — I69322 Dysarthria following cerebral infarction: Secondary | ICD-10-CM | POA: Diagnosis not present

## 2015-07-16 DIAGNOSIS — I4891 Unspecified atrial fibrillation: Secondary | ICD-10-CM | POA: Diagnosis not present

## 2015-07-16 DIAGNOSIS — I69322 Dysarthria following cerebral infarction: Secondary | ICD-10-CM | POA: Diagnosis not present

## 2015-07-16 DIAGNOSIS — I69354 Hemiplegia and hemiparesis following cerebral infarction affecting left non-dominant side: Secondary | ICD-10-CM | POA: Diagnosis not present

## 2015-07-16 DIAGNOSIS — I10 Essential (primary) hypertension: Secondary | ICD-10-CM | POA: Diagnosis not present

## 2015-07-16 DIAGNOSIS — M4806 Spinal stenosis, lumbar region: Secondary | ICD-10-CM | POA: Diagnosis not present

## 2015-07-16 DIAGNOSIS — D72829 Elevated white blood cell count, unspecified: Secondary | ICD-10-CM | POA: Diagnosis not present

## 2015-07-16 DIAGNOSIS — G40909 Epilepsy, unspecified, not intractable, without status epilepticus: Secondary | ICD-10-CM | POA: Diagnosis not present

## 2015-07-22 DIAGNOSIS — I69322 Dysarthria following cerebral infarction: Secondary | ICD-10-CM | POA: Diagnosis not present

## 2015-07-22 DIAGNOSIS — I69354 Hemiplegia and hemiparesis following cerebral infarction affecting left non-dominant side: Secondary | ICD-10-CM | POA: Diagnosis not present

## 2015-07-22 DIAGNOSIS — D72829 Elevated white blood cell count, unspecified: Secondary | ICD-10-CM | POA: Diagnosis not present

## 2015-07-22 DIAGNOSIS — G40909 Epilepsy, unspecified, not intractable, without status epilepticus: Secondary | ICD-10-CM | POA: Diagnosis not present

## 2015-07-22 DIAGNOSIS — I4891 Unspecified atrial fibrillation: Secondary | ICD-10-CM | POA: Diagnosis not present

## 2015-07-22 DIAGNOSIS — M4806 Spinal stenosis, lumbar region: Secondary | ICD-10-CM | POA: Diagnosis not present

## 2015-07-22 DIAGNOSIS — I10 Essential (primary) hypertension: Secondary | ICD-10-CM | POA: Diagnosis not present

## 2015-07-29 ENCOUNTER — Telehealth: Payer: Self-pay | Admitting: Family Medicine

## 2015-07-29 ENCOUNTER — Ambulatory Visit (INDEPENDENT_AMBULATORY_CARE_PROVIDER_SITE_OTHER): Payer: Medicare Other | Admitting: Family Medicine

## 2015-07-29 ENCOUNTER — Encounter: Payer: Self-pay | Admitting: Family Medicine

## 2015-07-29 VITALS — BP 148/78 | HR 61 | Temp 97.5°F | Wt 116.0 lb

## 2015-07-29 DIAGNOSIS — R410 Disorientation, unspecified: Secondary | ICD-10-CM

## 2015-07-29 LAB — POCT URINALYSIS DIPSTICK
BILIRUBIN UA: NEGATIVE
Glucose, UA: NEGATIVE
Ketones, UA: NEGATIVE
NITRITE UA: NEGATIVE
PH UA: 6
PROTEIN UA: NEGATIVE
Spec Grav, UA: 1.025
Urobilinogen, UA: 0.2

## 2015-07-29 NOTE — Telephone Encounter (Signed)
Son says doing fine today. Hallucinations happened all day long two days ago. Says grandson was with her all day when he was not, he was in [place]. Says little girl was sitting next to her with head on shoulder yesterday evening when no little girl was at her house. Today she has not had any hallucinations. Denies falling and anything 'different' happening.

## 2015-07-29 NOTE — Telephone Encounter (Signed)
Spoke with Dr. Yong Channel. Dr. Yong Channel will like to work patient in today. Left message for patient to call office back to make aware and get seen.

## 2015-07-29 NOTE — Progress Notes (Addendum)
Carolyn Reddish, MD  Subjective:  [name] is a 81 y.o. year old very pleasant female patient who presents for/with See problem oriented charting ROS- No chest pain or shortness of breath. No headache or blurry vision. No dysuria or polyuria. No palpitations. Has been drinking adequately  Past Medical History-  Patient Active Problem List   Diagnosis Date Noted  . Atrial fibrillation ([place]) 03/29/2015    Priority: High  . ICH (intracerebral hemorrhage) ([place])     Priority: High  . Essential hypertension 06/22/2006    Priority: Medium  . Dizzy spells 11/10/2014    Priority: Low  . SPINAL STENOSIS, CERVICAL 06/11/2010    Priority: Low  . Arthritis of right knee 06/22/2006    Priority: Low  . Osteopenia 06/22/2006    Priority: Low  . Intention tremor 06/21/2015  . 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 Cerebral amyloid angiopathy 05/29/2015    Medications- reviewed and updated Current Outpatient Prescriptions  Medication Sig Dispense Refill  . amLODipine (NORVASC) 2.5 MG tablet Take 1 tablet (2.5 mg total) by mouth daily. 30 tablet 5  . losartan (COZAAR) 100 MG tablet Take 1 tablet (100 mg total) by mouth daily. 30 tablet 5  . Multiple Vitamin (MULTIVITAMIN) tablet Take 1 tablet by mouth daily.       No current facility-administered medications for this visit.    Objective: BP 148/78 mmHg  Pulse 61  Temp(Src) 97.5 F (36.4 C)  Wt 116 lb (52.617 kg)  SpO2 97% Gen: NAD, resting comfortably CV: irregularly irregular, no murmurs rubs or gallops Lungs: CTAB no crackles, wheeze, rhonchi Abdomen: soft/nontender/nondistended/normal bowel sounds. No rebound or guarding.  Ext: no edema Skin: warm, dry Neuro: CN II-XII intact (slight left facial droop with history of bells palsy), sensation and reflexes normal throughout, 5-/5 muscle strength in bilateral upper and lower extremities. Normal finger to nose. Normal rapid alternating  movements. No pronator drift. Normal romberg. Normal gait with aid of walker  Assessment/Plan:  Delirium with hallucinations S:Hallucinations all day Saturday. Has not seen grandson in 3 years but saw him. He was next to her and trying to hug him. He obviously was not there. Also saw another grandson. They had both literally been there at Thanksgiving- but had not seen them in a week. Sunday night hallucinated/saw a girl on her left side nuzzling up to her. Today, hallucinated/saw son in law in front of fridge. Frequency is less but continues to see hallucinations. Experiencing confusion. No fall or injury.  over last week. . This morning didn't know if it was morning or night.  A/P: Etiology unclear from exam and history. Evaluate infection with CBC diff and UA. No obvious signs on exam and doubt meningitis. With ICH history- get CT of head given CAA which predisposes to [place].  Advised family to set up neuro follow up though I doubt seizures. Check electrolytes and liver function as well as sugar.   Hypertension S: BP log brought from home and about 50% of time was >140/90.  A/P: would like to push BP lower but in case CVA related (doubt)- will hold on adjustments   Emergent Return precautions advised.   Orders Placed This Encounter  Procedures  . Urine culture    [facility]  . CT Head Wo Contrast    Standing Status: Future     Number of Occurrences:      Standing Expiration Date: 10/26/2016    Scheduling Instructions:  I know today may not be possible so please schedule for tomorrow    Order Specific Question:  Reason for Exam (SYMPTOM  OR DIAGNOSIS REQUIRED)    Answer:  delirium in patient with history of intracerebral hemorrhage    Order Specific Question:  Preferred imaging location?    Answer:  [facility]  . CBC with Differential/Platelet  . Comprehensive metabolic panel    [place]  . POCT urinalysis dipstick    In house

## 2015-07-29 NOTE — Telephone Encounter (Signed)
FYI

## 2015-07-29 NOTE — Telephone Encounter (Signed)
PLEASE NOTE: All timestamps contained within this report are represented as [place] Standard Time. CONFIDENTIALTY NOTICE: This fax transmission is intended only for the addressee. It contains information that is legally privileged, confidential or otherwise protected from use or disclosure. If you are not the intended recipient, you are strictly prohibited from reviewing, disclosing, copying using or disseminating any of this information or taking any action in reliance on or regarding this information. If you have received this fax in error, please notify us immediately by telephone so that we can arrange for its return to [place]. Phone: [phone], Toll-Free: [phone], Fax: [phone] Page: 1 of 1 Call Id: [id number] [facility] [facility] - Client [place] Patient Name: [name] DOB: February 17, 1918 Initial Comment Caller states aunt was experiencing hallucinations over the weekend, recent medication changes Nurse Assessment Nurse: Marcelline Deist, RN, Kermit Balo Date/Time (Eastern Time): 07/29/2015 11:49:27 AM Confirm and document reason for call. If symptomatic, describe symptoms. ---Caller states his aunt was experiencing hallucinations over the weekend,, seeing people that were not really there. There have been recent medication changes with her BP rx. 137/73, 127/75 readings. No symptoms this am. The caller spoke with his aunt, not with her currently. She usually talks to her Dr's triage nurse. Has the patient traveled out of the country within the last 30 days? ---Not Applicable Does the patient have any new or worsening symptoms? ---Yes Will a triage be completed? ---Yes Related visit to physician within the last 2 weeks? ---No Does the PT have any chronic conditions? (i.e. diabetes, asthma, etc.) ---Yes List chronic conditions. ---had a stroke a few months ago - hemorrhagic, on BP rx, CLL Is this a behavioral health or  substance abuse call? ---No Guidelines Guideline Title Affirmed Question Affirmed Notes Confusion - Delirium Sundowning, questions about Final Disposition User [place], RN, Kermit Balo Comments Caller states his aunt is not having symptoms currently. he is not sure what time of day these hallucinations occurred, but states his aunt was aware at the time that those people she thought she saw were not actually there in the room with her. Please call caller at this # or cell # [phone] if her Dr. feels she needs to be seen. Disagree/Comply: Comply

## 2015-07-29 NOTE — Telephone Encounter (Signed)
Patient coming in at 3:00pm today

## 2015-07-29 NOTE — Patient Instructions (Addendum)
Blood and urine before you go  CT scan due to history of brain bleed- they will call you about this to schedule within 24 hours hopefully  No change to blood pressure medicine today in case there is some change in brain but we may increase after we figure out what is going on now  If new or worsening symptoms, call our after hours line or go to emergency room   Delirium Delirium is a state of mental confusion. It comes on quickly and causes significant changes in a person's thinking and behavior. People with delirium usually have trouble paying attention to what is going on or knowing where they are. They may become very withdrawn or very emotional and unable to sit still. They may even see or feel things that are not there (hallucinations). Delirium is a sign of a serious underlying medical condition. CAUSES Delirium occurs when something suddenly affects the signals that the brain sends out. Brain signals can be affected by anything that puts severe stress on the body and brain and causes brain chemicals to be out of balance. The most common causes of delirium include:  Infections. These may be bacterial, viral, fungal, or protozoal.  Medicines. These include many over-the-counter and prescription medicines.  Recreational drugs.  Substance withdrawal. This occurs with sudden discontinuation of alcohol, certain medicines, or recreational drugs.  Surgery.  Sudden vascular events, such as stroke, brain hemorrhage, and severe migraine.  Other brain disorders, such as tumors, seizures, and physical head trauma.  Metabolic disorders, such as kidney or liver failure.  Low blood oxygen (anoxia). This may occur with lung disease, cardiac arrest, or carbon monoxide poisoning.  Hormone imbalances (endocrinopathies), such as an overactive thyroid (hyperthyroidism) or underactive thyroid (hypothyroidism).  Vitamin deficiencies. RISK FACTORS This condition is more likely to develop  in:  Children.  Older people.  People who live alone.  People who have vision loss or hearing loss.  People who have existing brain disease, such as dementia.  People who have long-lasting (chronic) medical conditions, such as heart disease.  People who are hospitalized for long periods of time. SYMPTOMS Delirium starts with a sudden change in a person's thinking or behavior. Symptoms come and go (fluctuate) over time, and they are often worse at the end of the day. Symptoms include:  Not being able to stay awake (drowsiness) or pay attention.  Being confused about places, time, and people.  Forgetfulness.  Having extreme energy levels. These may be low or high.  Changes in sleep patterns.  Extreme mood swings, such as anger or anxiety.  Focusing on things or ideas that are not important.  Rambling and senseless talking.  Difficulty speaking, understanding speech, or both.  Hallucinations.  Tremor or unsteady gait. DIAGNOSIS People with delirium may not realize that they have the condition. Often, a family member or health care provider is the first person to notice the changes. The health care provider will obtain a detailed history of current symptoms, medical issues, medicines, and recreational drug use. The health care provider will perform a mental status examination by:  Asking questions to check for confusion.  Watching for abnormal behavior. The health care provider may perform a physical exam and order lab tests or additional studies to determine the cause of the delirium. TREATMENT Treatment of delirium depends on the cause and severity. Delirium usually goes away within days or weeks of treating the underlying cause. In the meantime, the person should not be left alone because he or  she may accidentally cause self-harm. Treatment includes supportive care, such as:  Increased light during the day and decreased light at night.  Low noise  level.  Uninterrupted sleep.  A regular daily schedule.  Clocks and calendars to help with orientation.  Familiar objects, including the person's pictures and clothing.  Frequent visits from familiar family and friends.  Healthy diet.  Exercise. In more severe cases of delirium, medicine may be prescribed to help the person to keep calm and think more clearly. HOME CARE INSTRUCTIONS  Any supportive care should be continued as told by the health care provider.  All medicines should be used as told by the health care provider. This is important.  The health care provider should be consulted before over-the-counter medicines, herbs, or supplements are used.  All follow-up visits should be kept as told by the health care provider. This is important.  Alcohol and recreational drugs should be avoided as told by the health care provider. SEEK MEDICAL CARE IF:  Symptoms do not get better or they become worse.  New symptoms of delirium develop.  Caring for the person at home does not seem safe.  Eating, drinking, or communicating stops.  There are side effects of medicines, such as changes in sleep patterns, dizziness, weight gain, restlessness, movement changes, or tremors. SEEK IMMEDIATE MEDICAL CARE IF:  Serious thoughts occur about self-harm or about hurting others.  There are serious side effects of medicine, such as:  Swelling of the face, lips, tongue, or throat.  Fever, confusion, muscle spasms, or seizures.   This information is not intended to replace advice given to you by your health care provider. Make sure you discuss any questions you have with your health care provider.   Document Released: 05/04/2012 Document Revised: 12/25/2014 Document Reviewed: 10/03/2014 Elsevier Interactive Patient Education [employer].

## 2015-07-30 ENCOUNTER — Ambulatory Visit (HOSPITAL_COMMUNITY)
Admission: RE | Admit: 2015-07-30 | Discharge: 2015-07-30 | Disposition: A | Discharge status: Home / Self Care | Payer: Medicare Other | Source: Ambulatory Visit | Attending: Family Medicine | Admitting: Family Medicine

## 2015-07-30 DIAGNOSIS — R410 Disorientation, unspecified: Secondary | ICD-10-CM | POA: Insufficient documentation

## 2015-07-30 DIAGNOSIS — G9389 Other specified disorders of brain: Secondary | ICD-10-CM | POA: Diagnosis not present

## 2015-07-30 DIAGNOSIS — Z0389 Encounter for observation for other suspected diseases and conditions ruled out: Secondary | ICD-10-CM | POA: Diagnosis not present

## 2015-07-30 DIAGNOSIS — Z8679 Personal history of other diseases of the circulatory system: Secondary | ICD-10-CM | POA: Insufficient documentation

## 2015-07-30 LAB — COMPREHENSIVE METABOLIC PANEL
ALT: 13 U/L (ref 0–35)
AST: 17 U/L (ref 0–37)
Albumin: 4.3 g/dL (ref 3.5–5.2)
Alkaline Phosphatase: 63 U/L (ref 39–117)
BUN: 27 mg/dL — ABNORMAL HIGH (ref 6–23)
CALCIUM: 9.7 mg/dL (ref 8.4–10.5)
CHLORIDE: 107 meq/L (ref 96–112)
CO2: 32 meq/L (ref 19–32)
CREATININE: 0.88 mg/dL (ref 0.40–1.20)
GFR: 63.35 mL/min (ref >60.00)
GLUCOSE: 116 mg/dL — AB (ref 70–99)
Potassium: 4.3 mEq/L (ref 3.5–5.1)
Sodium: 149 mEq/L — ABNORMAL HIGH (ref 135–145)
Total Bilirubin: 0.7 mg/dL (ref 0.2–1.2)
Total Protein: 6.8 g/dL (ref 6.0–8.3)

## 2015-07-30 LAB — CBC WITH DIFFERENTIAL/PLATELET
Basophils Absolute: 0 10*3/uL (ref 0.0–0.1)
Basophils Relative: 0.4 % (ref 0.0–3.0)
Eosinophils Absolute: 0.1 10*3/uL (ref 0.0–0.7)
Eosinophils Relative: 0.9 % (ref 0.0–5.0)
HCT: 42.2 % (ref 36.0–46.0)
Hemoglobin: 13.9 g/dL (ref 12.0–15.0)
Lymphocytes Relative: 20.1 % (ref 12.0–46.0)
Lymphs Abs: 1.6 10*3/uL (ref 0.7–4.0)
MCHC: 33 g/dL (ref 30.0–36.0)
MCV: 88.5 fl (ref 78.0–100.0)
Monocytes Absolute: 0.7 10*3/uL (ref 0.1–1.0)
Monocytes Relative: 8.5 % (ref 3.0–12.0)
Neutro Abs: 5.6 10*3/uL (ref 1.4–7.7)
Neutrophils Relative %: 70.1 % (ref 43.0–77.0)
Platelets: 174 10*3/uL (ref 150.0–400.0)
RBC: 4.77 Mil/uL (ref 3.87–5.11)
RDW: 15.1 % (ref 11.5–15.5)
WBC: 8.1 10*3/uL (ref 4.0–10.5)

## 2015-07-30 LAB — URINE CULTURE
Colony Count: NO GROWTH
Organism ID, Bacteria: NO GROWTH

## 2015-07-31 ENCOUNTER — Telehealth: Payer: Self-pay | Admitting: Family Medicine

## 2015-07-31 NOTE — Telephone Encounter (Signed)
Pt call would like a call back about her lab results.

## 2015-07-31 NOTE — Telephone Encounter (Signed)
Returned pt call and informed her that we would call her once we get lab results back and they have been reviewed.

## 2015-08-07 ENCOUNTER — Encounter: Payer: Medicare Other | Admitting: Physical Medicine & Rehabilitation

## 2015-08-14 ENCOUNTER — Encounter: Payer: Medicare Other | Attending: Physical Medicine & Rehabilitation | Admitting: Physical Medicine & Rehabilitation

## 2015-08-14 ENCOUNTER — Encounter: Payer: Self-pay | Admitting: Physical Medicine & Rehabilitation

## 2015-08-14 VITALS — BP 157/92 | HR 87

## 2015-08-14 DIAGNOSIS — M129 Arthropathy, unspecified: Secondary | ICD-10-CM | POA: Insufficient documentation

## 2015-08-14 DIAGNOSIS — G252 Other specified forms of tremor: Secondary | ICD-10-CM | POA: Insufficient documentation

## 2015-08-14 DIAGNOSIS — Z8673 Personal history of transient ischemic attack (TIA), and cerebral infarction without residual deficits: Secondary | ICD-10-CM | POA: Diagnosis not present

## 2015-08-14 DIAGNOSIS — F329 Major depressive disorder, single episode, unspecified: Secondary | ICD-10-CM

## 2015-08-14 DIAGNOSIS — I610 Nontraumatic intracerebral hemorrhage in hemisphere, subcortical: Secondary | ICD-10-CM | POA: Insufficient documentation

## 2015-08-14 DIAGNOSIS — Z79899 Other long term (current) drug therapy: Secondary | ICD-10-CM | POA: Diagnosis not present

## 2015-08-14 DIAGNOSIS — I611 Nontraumatic intracerebral hemorrhage in hemisphere, cortical: Secondary | ICD-10-CM | POA: Diagnosis not present

## 2015-08-14 DIAGNOSIS — I10 Essential (primary) hypertension: Secondary | ICD-10-CM | POA: Insufficient documentation

## 2015-08-14 DIAGNOSIS — M8580 Other specified disorders of bone density and structure, unspecified site: Secondary | ICD-10-CM | POA: Diagnosis not present

## 2015-08-14 MED ORDER — ESCITALOPRAM OXALATE 5 MG PO TABS: 5.0000 mg | ORAL_TABLET | Freq: Every day | ORAL | Status: DC

## 2015-08-14 MED ORDER — ALPRAZOLAM 0.25 MG PO TABS: 0.2500 mg | ORAL_TABLET | Freq: Two times a day (BID) | ORAL | Status: DC | PRN

## 2015-08-14 NOTE — Patient Instructions (Addendum)
NEED TO WORK ON A DAILY SCHEDULE AND ROUTINE.    USE A CALENDAR/ORGANIZER/WATCH TO HELP WITH ORIENTATION TO DAY/MONTH.    WE CAN CONSIDER A MEDICATION FOR MEMORY AS WELL, BUT I WANT TO ADDRESS DEPRESSION AND ANXIETY FIRST.    PLEASE CALL ME WITH ANY PROBLEMS OR QUESTIONS [id number]). HAVE A HAPPY HOLIDAY SEASON!!!

## 2015-08-14 NOTE — Progress Notes (Signed)
Subjective:    Patient ID: [name], female    DOB: 01/14/1918, 81 y.o.   MRN: [id number]  HPI   [name] is here in follow up of her [place] and functional deficits. She is struggling with ongoing memory deficits. Caregiver is here to fill in some information. She reports that [name] has been having a lot of depression and anxiety. Her husband is at home and has health issues of his own. She has been more upset around the holidays as well thinking about to some of the happy times she had with her family.   Pain Inventory Average Pain 0 Pain Right Now 0 My pain is NA  In the last 24 hours, has pain interfered with the following? General activity 0 Relation with others 0 Enjoyment of life 0 What TIME of day is your pain at its worst? NA Sleep (in general) NA  Pain is worse with: NA Pain improves with: NA Relief from Meds: 0  Mobility walk without assistance  Function retired  Neuro/Psych dizziness confusion depression anxiety  Prior Studies Any changes since last visit?  no  Physicians involved in your care Any changes since last visit?  no   Family History  Problem Relation Age of Onset  . Cancer Daughter     breast   Social History   Social History  . Marital Status: Married    Spouse Name: N/A  . Number of Children: N/A  . Years of Education: N/A   Social History Main Topics  . Smoking status: Never Smoker   . Smokeless tobacco: Never Used  . Alcohol Use: 0.6 oz/week    1 Glasses of wine per week     Comment: 1 glass with dinner  . Drug Use: No  . Sexual Activity: Not Asked   Other Topics Concern  . None   Social History Narrative   Married (husband Tom age 46 in [facility] practice) 27. 2 [place] oldest daughter to breast cancer. 4 grandkids.       Husband and patient live with daughter.       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       Hobbies: reading, had been quilting, enjoys  cooking/baking.       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    Full Code.             Past Surgical History  Procedure Laterality Date  . Cataract extraction     Past Medical History  Diagnosis Date  . Hypertension   . Osteopenia   . Stroke (HCC)    BP 157/92 mmHg  Pulse 87  SpO2 98%  Opioid Risk Score:   Fall Risk Score:  `1  Depression screen PHQ 2/9  Depression screen [facility] 2/9 05/29/2015 01/04/2015 10/30/2013  Decreased Interest 0 0 0  Down, Depressed, Hopeless 0 0 0  PHQ - 2 Score 0 0 0      Review of Systems  Neurological: Positive for dizziness.  Psychiatric/Behavioral: Positive for confusion. The patient is nervous/anxious.        Depression  All other systems reviewed and are negative.      Objective:   Physical Exam  HEENT: normal  Cardio: IRRR and no murmur  Resp: CTA B/L and unlabored  GI: BS positive and NT, ND  Extremity: No Edema  Skin: Intact  Neuro: Flat, Abnormal Sensory parathesia to LT in RLE and 4+/5 Motor BUE and BLE, oriented to person and place.  Remembered me when i entered the room. Recalled month/day of the week/year. Could not recall day of month.  Is able to answer basic biographical inforamtion and recalls recent events at home. Tremor was not so severe today. Her balance was good with the walker with adequate weight shift. She tended to lean and fall a bit to the right without her walker.  Musc/Skel: Right knee tenderness improved. She walks with better weight shift to the right but still displays a little antalgia. Low back with minimal tenderness upon palpation.  Gen NAD    Assessment/Plan:  1. Functional deficits secondary to right temporal intraparenchymal hemorrhage  -STILL commend continued use of the walker for balance.    2. Pain Management: Voltaren gel 4 times a day to right knee, scheduled ice, no current knee pain  -neoprene knee sleeve for right knee support prn  3. Neuropsych:  Recommend low dose lexapro for depression  -xanax  0.25mg  prn for severe anxiety.   -discussed the use of a routine/schedule/structure  -consider aricept to augment memory  -she and her husband will have to work through some of the life changes they are experiencing 4. Tremors--may be more anxiety related---didn't see many today 8. Hypertension. Cozaar 100 mg daily and norvasc      Thirty minutes of face to face patient care time were spent during this visit. All questions were encouraged and answered.  Follow up in 2 months.

## 2015-08-21 ENCOUNTER — Encounter: Payer: Self-pay | Admitting: Family Medicine

## 2015-08-27 ENCOUNTER — Telehealth: Payer: Self-pay

## 2015-08-27 NOTE — Telephone Encounter (Signed)
Pt's daughter called stating that since she has started the Lexapro, she is feeling very fatigued. C/O having no energy. They would like to know if the Lexapro could be cut in half or get a new rx?

## 2015-08-27 NOTE — Telephone Encounter (Signed)
[name] to cut tablet in half. If pt still has chronic fatigue, then she needs to stop taking the Lexapro.

## 2015-08-27 NOTE — Telephone Encounter (Signed)
They can try cutting it in half first to see how she does.  If she still feels tired on the 2.5mg , there is nowhere lower to go. ---she would just have to stop.

## 2015-09-05 ENCOUNTER — Ambulatory Visit (INDEPENDENT_AMBULATORY_CARE_PROVIDER_SITE_OTHER): Payer: Medicare Other | Admitting: Neurology

## 2015-09-05 ENCOUNTER — Encounter: Payer: Self-pay | Admitting: Neurology

## 2015-09-05 VITALS — BP 121/55 | HR 70 | Ht 59.0 in | Wt 117.0 lb

## 2015-09-05 DIAGNOSIS — I10 Essential (primary) hypertension: Secondary | ICD-10-CM | POA: Diagnosis not present

## 2015-09-05 DIAGNOSIS — I611 Nontraumatic intracerebral hemorrhage in hemisphere, cortical: Secondary | ICD-10-CM

## 2015-09-05 DIAGNOSIS — I482 Chronic atrial fibrillation, unspecified: Secondary | ICD-10-CM

## 2015-09-05 DIAGNOSIS — E854 Organ-limited amyloidosis: Secondary | ICD-10-CM

## 2015-09-05 DIAGNOSIS — I680 Cerebral amyloid angiopathy: Secondary | ICD-10-CM | POA: Diagnosis not present

## 2015-09-05 NOTE — Progress Notes (Signed)
STROKE NEUROLOGY FOLLOW UP NOTE  NAME: Tarneshia Henriksen Pucciarelli DOB: [birth year]  REASON FOR VISIT: stroke follow up HISTORY FROM: daughter and chart  Today we had the pleasure of seeing Jax P Dinger in follow-up at our Neurology Clinic. Pt was accompanied by daughter.   History Summary Ms. KINSEE KLINKNER is a 82 y.o. female with history of HTN and left Bell's palsy many years ago presenting with altered mental status, left hemiparesis, dysarthria and admitted on 03/19/2015.  MRI and CT showed large right temporal ICH with IVH and cerebral edema. Etiology not clear, either hypertensive versus CAA.  Repeat CT stable. However, she developed altered mental status,  very lethargic, found to have UTI , and was treated with antibodies.  EEG showed frequent right frontal sharp waves , and she was put on Keppra. Repeat EEG showed triphasic waves consistent with encephalopathy , which likely due to UTI.  Patient also was found to have A. Fib , and bradycardia with asystole episode. However she was not a candidate for anticoagulation at the time.  She was then discharged to CIR after stabilization.  05/29/15 follow up - the patient has been doing well. She stayed in CIR for about two weeks, and then discharged to home with [facility] which still continues until now. She is weaned off keppra in [place]. Currently only on losartan for BP control. Today BP 139/72.   Interval History During the interval time, the pt has been doing well from stroke standpoint. No new stroke like symptoms. However, she did have hallucination early December. Pt seeing and hearing thing or people but not there. Pt was aware things were not real. CT head no acute changes and blood work largely unremarkable. No UTI. Now retrospectively, the event likely due to her hearing aid issue, causing her lack of auditory input and started to have auditory hallucination and then visual hallucination. Since the hearing aid issue resolved, no more hallucination  episodes.  She was found to have mild depression on Dr. Naaman Plummer visit, put on low dose lexapro but she not able to tolerate due to imbalance and lethargy. It was stopped and she is back to baseline. Today in clinic, she was pleasant and mentally at baseline. BP 121/55. No complains.  REVIEW OF SYSTEMS: Full 14 system review of systems performed and notable only for those listed below and in HPI above, all others are negative:  Constitutional:   Cardiovascular:  Ear/Nose/Throat:   Skin:  Eyes:   Respiratory:   Gastroitestinal:   Genitourinary:  Hematology/Lymphatic:  Bruise easily Endocrine:  Musculoskeletal:   Allergy/Immunology:   Neurological:   Psychiatric:  Sleep:   The following represents the patient's updated allergies and side effects list: Allergies  Allergen Reactions  . Ace Inhibitors     REACTION: angioedema    The neurologically relevant items on the patient's problem list were reviewed on today's visit.  Neurologic Examination  A problem focused neurological exam (12 or more points of the single system neurologic examination, vital signs counts as 1 point, cranial nerves count for 8 points) was performed.  Blood pressure 121/55, pulse 70, height 4\' 11"  (1.499 m), weight 117 lb (53.071 kg).  General - Well nourished, well developed, in no apparent distress.  Ophthalmologic - Fundi not visualized due to eye movement.  Cardiovascular - irregularly irregular heart rhythm, and bradycardia.  Mental Status -  Level of arousal and orientation to time, place, and person were intact. Language including expression, naming, repetition, comprehension was assessed  and found intact. Fund of Knowledge was assessed and was impaired.  Cranial Nerves II - XII - II - Visual field intact OU. III, IV, VI - Extraocular movements intact. V - Facial sensation intact bilaterally. VII - Facial movement decreased on the left, left eyelid smaller. VIII - hard of hearing &  vestibular intact bilaterally. X - Palate elevates symmetrically. XI - Chin turning & shoulder shrug intact bilaterally. XII - Tongue protrusion intact.  Motor Strength - The patient's strength was 4/5 in all extremities and pronator drift was absent.  Bulk was normal and fasciculations were absent.   Motor Tone - Muscle tone was assessed at the neck and appendages and was normal.  Reflexes - The patient's reflexes were 1+ in all extremities and she had no pathological reflexes.  Sensory - Light touch, temperature/pinprick were assessed and were normal.    Coordination - The patient had normal movements in the hands and feet with no ataxia or dysmetria.  Tremor was absent.  Gait and Station - walk with walker, stooped posturing, slow, small stride, but steady.  Data reviewed: I personally reviewed the images and agree with the radiology interpretations.  Ct Head Wo Contrast 03/24/15 - Unchanged exam with right temporal intraparenchymal hemorrhage, 6 mm right to left midline shift, focal 4 mm right anterior parafalcine subdural hematoma and intraventricular hemorrhage again noted.  03/22/2015 : Expected evolution of multifocal intracranial hemorrhage without significant interval progression. The small subdural hemorrhage overlying the right temporal lobe is significantly less conspicuous on today's exam, the intraparenchymal hemorrhage within the right temporal lobe and small volume intraventricular hemorrhage layering within both occipital horns are essentially unchanged.  03/20/2015 1. No significant interval change in size of right temporal lobe intraparenchymal hemorrhage with slightly increased localized vasogenic edema. 2. No significant interval change in acute subdural hematoma overlying the right cerebral convexity measuring up to 5 mm in maximal thickness. Associated 6 mm of right-to-left shift is not significantly changed. 3. Small volume intraventricular hemorrhage, slightly  increased from prior. Asymmetric dilatation of the left lateral ventricle is stable. No hydrocephalus. 4. No new intracranial process.   03/19/2015 Large intraparenchymal hemorrhage of the right temporal lobe with a 6 mm right frontal temporal acute extra-axial hemorrhage. There is right to left midline shift.   03/27/15 - No new hemorrhage. No increase in mass effect. Right temporal hematoma undergoing expected evolutionary changes, becoming less dense and less distinct.  07/30/15 - 1. No acute finding. 2. Right temporal lobe gliosis related to large remote hemorrhage.  Mr Brain Wo Contrast 03/20/2015 1. Stable size of large right temporal lobe parenchymal hemorrhage with layering blood products. 2. Similar appearance of diffuse vasogenic edema and 8 mm midline shift. 3. Stable small right extra-axial hemorrhage. 4. Blood products layering within the ventricles bilaterally without hydrocephalus. 5. Stable chronic small vessel T2 changes.   Mr Jodene Nam Head Wo Contrast 03/20/2015 6. The MRA demonstrates displacement of the MCA branch vessels without a focal lesion to explain the hemorrhage. 7. Diffuse small vessel disease is present on the MRA.   Dg Chest Port 1 View 03/25/2015 1. Bilateral medial lung base opacity, mild, likely representing atelectasis although possibility of pneumonia not excluded and follow-up recommended. 2. Aortic atherosclerotic calcification 03/20/2015 Left internal jugular catheter is noted with distal tip in expected position the SVC. No acute cardiopulmonary abnormality seen.  03/28/2015 No acute cardiopulmonary disease. Chest is stable from prior exam.  2D echo - Left ventricle: The cavity size was normal. Wall thickness  wasincreased in a pattern of mild LVH. There was focal basalhypertrophy. Systolic function was vigorous. The estimatedejection fraction was in the range of 65% to 70%. Wall motion was normal; there were no regional wall motion  abnormalities. - Aortic valve: Moderately calcified annulus. - Mitral valve: Severely calcified annulus. There was mild regurgitation. Valve area by continuity equation (using LVOT flow): 1.47 cm^2. - Left atrium: The atrium was moderately dilated.  EEG  03/25/15 -  1) Frequent right frontal sharp waves Clinical Interpretation: This EEG is consistent with a region of potential epileptogenicity in the right frontal region). No seizure was recorded.  03/26/15  1) Triphasic waves 2) Generalized slow activity.  Clinical Interpretation: This EEG is consistent with an encephalopathy. Triphasic waves are associai]ted with many kinds of toxic/metabolic encephalopathies including infection, hepatic failure, renal failure, medication effect among other causes.   Assessment: As you may recall, she is a 82 y.o. Caucasian female with PMH of hypertension and left Bell's palsy many years ago was admitted on 03/19/2015 for large right temporal ICH with IVH and cerebral edema. Etiology not clear, concerning for CAA. Hospitalization complicated with UTI and encephalopathy, and was treated with antibodies.  EEG showed frequent right frontal sharp waves, and she was put on Keppra. Repeat EEG showed triphasic waves consistent with encephalopathy , which likely due to UTI.  Patient also was found to have A. Fib , and bradycardia with asystole episode. However she was not candidate for anticoagulation at the time.  She was then discharged to CIR after stabilization. Her Keppra was discontinued in CIR. There is dilemma in her medical management. She was not on aspirin 81 until 2 weeks prior to the current [place] event, concerning for CAA. However, patient does have atrial fibrillation and knees anticoagulation for stroke prevention. Discussed with daughter in clinic, she prefer patient not to be on any antiplatelet or anticoagulation in fear of further ICH.  During the interval time she was doing well. Had episode of  auditory and visual hallucination due to hearing aid issue and mild depression not tolerating lexapro. Now back to baseline.  Plan:  - will hold off any antiplatelet or anticoagulation due to concerns of CAA and the recurrent ICH.  - although patient has afib but not a good candidate for anticoagulation. - check BP at home, BP goal < 140 to avoid high risk of bleeding - home exercise and healthy diet - recommend more daily activity - Follow up with your primary care physician for stroke risk factor modification. Recommend maintain blood pressure goal <140/80, diabetes with hemoglobin A1c goal below 6.5% and lipids with LDL cholesterol goal below 70 mg/dL.  - follow up in 6 months   No orders of the defined types were placed in this encounter.    No orders of the defined types were placed in this encounter.    Patient Instructions  - check BP at home, BP goal < 140 to avoid high risk of bleeding - home exercise and healthy diet - recommend more daily activity with your comfortable level - Follow up with your primary care physician for stroke risk factor modification. Recommend maintain blood pressure goal <140/80, diabetes with hemoglobin A1c goal below 6.5% and lipids with LDL cholesterol goal below 70 mg/dL.  - follow up in 6 months    [name], MD PhD [facility] [address], [place], [place] [address] [phone]

## 2015-09-05 NOTE — Patient Instructions (Signed)
-   check BP at home, BP goal < 140 to avoid high risk of bleeding - home exercise and healthy diet - recommend more daily activity with your comfortable level - Follow up with your primary care physician for stroke risk factor modification. Recommend maintain blood pressure goal <140/80, diabetes with hemoglobin A1c goal below 6.5% and lipids with LDL cholesterol goal below 70 mg/dL.  - follow up in 6 months

## 2015-09-10 ENCOUNTER — Ambulatory Visit: Payer: Medicare Other | Admitting: Family Medicine

## 2015-09-11 ENCOUNTER — Ambulatory Visit: Payer: Medicare Other | Admitting: Family Medicine

## 2015-09-27 ENCOUNTER — Ambulatory Visit (INDEPENDENT_AMBULATORY_CARE_PROVIDER_SITE_OTHER): Payer: Medicare Other | Admitting: Family Medicine

## 2015-09-27 ENCOUNTER — Encounter: Payer: Self-pay | Admitting: Family Medicine

## 2015-09-27 VITALS — BP 132/70 | HR 72 | Temp 98.1°F | Wt 118.0 lb

## 2015-09-27 DIAGNOSIS — R351 Nocturia: Secondary | ICD-10-CM | POA: Diagnosis not present

## 2015-09-27 DIAGNOSIS — R32 Unspecified urinary incontinence: Secondary | ICD-10-CM

## 2015-09-27 DIAGNOSIS — R3589 Other polyuria: Secondary | ICD-10-CM

## 2015-09-27 DIAGNOSIS — R358 Other polyuria: Secondary | ICD-10-CM

## 2015-09-27 LAB — POC URINALSYSI DIPSTICK (AUTOMATED)
BILIRUBIN UA: NEGATIVE
Blood, UA: NEGATIVE
GLUCOSE UA: NEGATIVE
KETONES UA: NEGATIVE
LEUKOCYTES UA: NEGATIVE
Nitrite, UA: NEGATIVE
Protein, UA: NEGATIVE
Spec Grav, UA: 1.02
Urobilinogen, UA: NEGATIVE
pH, UA: 7

## 2015-09-27 LAB — POCT GLUCOSE (DEVICE FOR HOME USE): POC Glucose: 95 mg/dl (ref 70–99)

## 2015-09-27 NOTE — Patient Instructions (Signed)
No obvious urine infection- still get culture Not due to high blood sugar with sugar 95 today   I think the drastic increase in fluids may contribute to your symptoms but I think it will benefit you and help avoid dehydration- only change today is no liquids except sips for meds after 7pm. In addition, will go back to previous soap as sometimes irritants can cause frequent urination  . Return if symptoms are not manageable with these adjustments

## 2015-09-27 NOTE — Progress Notes (Signed)
Garret Reddish, MD  Subjective:  [name] is a 82 y.o. year old very pleasant female patient who presents for/with See problem oriented charting ROS- no fever, chills, low back pain, suprapubic pain, dysuria  Past Medical History-  Patient Active Problem List   Diagnosis Date Noted  . Atrial fibrillation ([place]) 03/29/2015    Priority: High  . ICH (intracerebral hemorrhage) ([place])     Priority: High  . Essential hypertension 06/22/2006    Priority: Medium  . Dizzy spells 11/10/2014    Priority: Low  . SPINAL STENOSIS, CERVICAL 06/11/2010    Priority: Low  . Arthritis of right knee 06/22/2006    Priority: Low  . Osteopenia 06/22/2006    Priority: Low  . Reactive depression 08/14/2015  . Intention tremor 06/21/2015  . 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 Cerebral amyloid angiopathy 05/29/2015    Medications- reviewed and updated Current Outpatient Prescriptions  Medication Sig Dispense Refill  . amLODipine (NORVASC) 2.5 MG tablet Take 1 tablet (2.5 mg total) by mouth daily. 30 tablet 5  . losartan (COZAAR) 100 MG tablet Take 1 tablet (100 mg total) by mouth daily. 30 tablet 5  . Multiple Vitamin (MULTIVITAMIN) tablet Take 1 tablet by mouth daily.       No current facility-administered medications for this visit.    Objective: BP 132/70 mmHg  Pulse 72  Temp(Src) 98.1 F (36.7 C)  Wt 118 lb (53.524 kg) Gen: NAD, resting comfortably in chair CV: RRR no murmurs rubs or gallops Lungs: CTAB no crackles, wheeze, rhonchi Abdomen: soft/no suprapubic painnondistended/normal bowel sounds. No rebound or guarding.  Ext: no edema Skin: warm, dry, no rash Neuro: grossly normal, moves all extremities, very slow gait- support with daughter or CMA appreciated  Results for orders placed or performed in visit on 09/27/15 (from the past 24 hour(s))  POCT Urinalysis Dipstick (Automated)     Status: None   Collection Time: 09/27/15 11:30  AM  Result Value Ref Range   Color, UA yellow    Clarity, UA clear    Glucose, UA neg    Bilirubin, UA neg    Ketones, UA neg    Spec Grav, UA 1.020    Blood, UA neg    pH, UA 7.0    Protein, UA neg    Urobilinogen, UA negative    Nitrite, UA neg    Leukocytes, UA Negative Negative  POCT Glucose (Device for Home Use)     Status: None   Collection Time: 09/27/15 11:31 AM  Result Value Ref Range   Glucose Fasting, POC  70 - 99 mg/dL   POC Glucose 95 70 - 99 mg/dl    Assessment/Plan:  Polyuria/Nocturia S:Previously patient was only drinking about 8-10 oz in an entire day. At last visit we were concerned about mild dehydration. She has increased to at least 32 oz of water a night with 4-6 oz with nighttime meds before bed. Since she has done this, she has noted difficulty with urgency, frequency of urination. She also pees 2-3x a night. She notes if she drinks less water it is better. Also has switched to oil of olay liquid soap which seemed to be around the time this started- daughter had noted urgency with this in past. The increase in fluids is intentional. Since she goes so frequently and feels urgency- she sometimes will have incontinence A/P: UA unremarkable- will get urine culture. CBG 95 and prior a1c of 5.8- doubt diabetes.  I think the drastic increase in fluids may contribute but I think it will benefit her and help avoid dehydration- only change is no liquids except sips for meds after 7pm. In addition, will go back to previous soap. Return if symptoms are not manageable with these adjustments  Return for as needed for new or worsening symptoms. Return precautions advised.   Orders Placed This Encounter  Procedures  . Culture, Urine

## 2015-09-29 LAB — URINE CULTURE
Colony Count: NO GROWTH
ORGANISM ID, BACTERIA: NO GROWTH

## 2015-10-14 ENCOUNTER — Encounter: Payer: Medicare Other | Attending: Physical Medicine & Rehabilitation | Admitting: Physical Medicine & Rehabilitation

## 2015-10-14 ENCOUNTER — Encounter: Payer: Self-pay | Admitting: Physical Medicine & Rehabilitation

## 2015-10-14 VITALS — BP 145/71 | HR 68 | Resp 14

## 2015-10-14 DIAGNOSIS — M129 Arthropathy, unspecified: Secondary | ICD-10-CM | POA: Insufficient documentation

## 2015-10-14 DIAGNOSIS — M8580 Other specified disorders of bone density and structure, unspecified site: Secondary | ICD-10-CM | POA: Insufficient documentation

## 2015-10-14 DIAGNOSIS — G252 Other specified forms of tremor: Secondary | ICD-10-CM | POA: Insufficient documentation

## 2015-10-14 DIAGNOSIS — I611 Nontraumatic intracerebral hemorrhage in hemisphere, cortical: Secondary | ICD-10-CM | POA: Diagnosis not present

## 2015-10-14 DIAGNOSIS — I10 Essential (primary) hypertension: Secondary | ICD-10-CM | POA: Diagnosis not present

## 2015-10-14 DIAGNOSIS — I612 Nontraumatic intracerebral hemorrhage in hemisphere, unspecified: Secondary | ICD-10-CM

## 2015-10-14 DIAGNOSIS — I610 Nontraumatic intracerebral hemorrhage in hemisphere, subcortical: Secondary | ICD-10-CM | POA: Diagnosis not present

## 2015-10-14 DIAGNOSIS — Z79899 Other long term (current) drug therapy: Secondary | ICD-10-CM | POA: Insufficient documentation

## 2015-10-14 DIAGNOSIS — Z8673 Personal history of transient ischemic attack (TIA), and cerebral infarction without residual deficits: Secondary | ICD-10-CM | POA: Diagnosis not present

## 2015-10-14 NOTE — Progress Notes (Signed)
Subjective:    Patient ID: [name], female    DOB: 1918-01-08, 82 y.o.   MRN: [id number]  HPI   [name] is here in follow up her intracranial hemorrhage. She states that her medications did not "agree" with her and made her feel like a "zombie". She is referring to the xanax and lexapro. She has felt better since come off of the medications. Over this time her mood has improved. She feels that at the time she saw me, she was feeling sorry for herself. Since the holidays, she feel that her mood has responded. She also felt that our visit was therapeutic also.  She has a new caregiver who has worked out well for her. Her husband continues to have medical issues of his own. She remains fairly active around home with her walker.         Pain Inventory Average Pain 0 Pain Right Now 0 My pain is no pain  In the last 24 hours, has pain interfered with the following? General activity 0 Relation with others 0 Enjoyment of life 0 What TIME of day is your pain at its worst? no pain Sleep (in general) Good  Pain is worse with: no pain Pain improves with: no pain Relief from Meds: no pain  Mobility walk with assistance use a walker ability to climb steps?  yes do you drive?  no Do you have any goals in this area?  no  Function retired Do you have any goals in this area?  no  Neuro/Psych No problems in this area  Prior Studies Any changes since last visit?  no  Physicians involved in your care Any changes since last visit?  no   Family History  Problem Relation Age of Onset  . Cancer Daughter     breast   Social History   Social History  . Marital Status: Married    Spouse Name: N/A  . Number of Children: N/A  . Years of Education: N/A   Social History Main Topics  . Smoking status: Never Smoker   . Smokeless tobacco: Never Used  . Alcohol Use: 0.6 oz/week    1 Glasses of wine per week     Comment: 1 glass with dinner  . Drug Use: No  . Sexual  Activity: Not Asked   Other Topics Concern  . None   Social History Narrative   Married (husband [name] age 53 in [place] practice) 71. 2 [place] oldest daughter to breast cancer. 4 grandkids.       Husband and patient live with daughter.       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       Hobbies: reading, had been quilting, enjoys cooking/baking.       HCPOA Daughter [name].    Full Code.             Past Surgical History  Procedure Laterality Date  . Cataract extraction     Past Medical History  Diagnosis Date  . Hypertension   . Osteopenia   . Stroke ([place])    BP 145/71 mmHg  Pulse 68  Resp 14  SpO2 96%  Opioid Risk Score:   Fall Risk Score:  `1  Depression screen PHQ 2/9  Depression screen [facility] 2/9 05/29/2015 01/04/2015 10/30/2013  Decreased Interest 0 0 0  Down, Depressed, Hopeless 0 0 0  PHQ - 2 Score 0 0 0     Review of Systems  All other systems reviewed and are negative.      Objective:   Physical Exam HEENT: normal  Cardio: IRRR and no murmur  Resp: CTA B/L and unlabored  GI: BS positive and NT, ND  Extremity: No Edema  Skin: Intact  Neuro: alert and oriented  Abnormal Sensory parathesia to LT in RLE and 4+/5 Motor BUE and BLE, oriented to person and place. Remembered me when i entered the room. Recalled month/day of the week/year.   Her balance was good with the walker with adequate weight shift.  Musc/Skel: gait more balanced. No antalgia.  Gen NAD Psych: mood is calm and fairly grounded today    Assessment/Plan:  1. Functional deficits secondary to right temporal intraparenchymal hemorrhage  -STILL commend continued use of the walker for balance. (even though her gait has improved) 2. Pain Management: Voltaren gel 4 times a day to right knee, scheduled ice, no current knee pain  -neoprene knee sleeve for right knee support prn  3. Neuropsych: -continue with a routine/schedule/structure  -could  consider at some point aricept to augment memory  -needs to make time for herself!!!  4. Tremors--appear minimal at this point  8. Hypertension. Cozaar 100 mg daily and norvasc    Thirty minutes of face to face patient care time were spent during this visit. All questions were encouraged and answered.  Follow up in 6 months.

## 2015-10-14 NOTE — Patient Instructions (Addendum)
Continue with a routine and schedule as much as you can.   Continue utilizing your walker for balance  Make time for yourself!!!!!   PLEASE CALL ME WITH ANY PROBLEMS OR QUESTIONS [id number]).

## 2015-12-07 ENCOUNTER — Other Ambulatory Visit: Payer: Self-pay | Admitting: Family Medicine

## 2015-12-13 ENCOUNTER — Telehealth: Payer: Self-pay | Admitting: Family Medicine

## 2015-12-13 NOTE — Telephone Encounter (Signed)
Is patient having any symptoms?  If not, have her continue to check over weekend and bring a log- see me on Monday or Tuesday

## 2015-12-13 NOTE — Telephone Encounter (Signed)
Pt states her therapist was there this am and told pt her bp was sort of high. Advised pt to take it again around 3pm, and if still elevated , to let Dr Yong Channel know. Pt's bp was 173/92. Please advise

## 2015-12-13 NOTE — Telephone Encounter (Signed)
Called pt and she is not having any symptoms. She will give [place] a call Monday to schedule.

## 2015-12-13 NOTE — Telephone Encounter (Signed)
See below

## 2016-01-21 ENCOUNTER — Encounter: Payer: Self-pay | Admitting: Family Medicine

## 2016-01-21 ENCOUNTER — Ambulatory Visit (INDEPENDENT_AMBULATORY_CARE_PROVIDER_SITE_OTHER): Payer: Medicare Other | Admitting: Family Medicine

## 2016-01-21 VITALS — BP 150/90 | HR 70 | Temp 98.1°F | Wt 122.0 lb

## 2016-01-21 DIAGNOSIS — I10 Essential (primary) hypertension: Secondary | ICD-10-CM

## 2016-01-21 MED ORDER — AMLODIPINE BESYLATE 5 MG PO TABS: 5.0000 mg | ORAL_TABLET | Freq: Every day | ORAL | Status: DC

## 2016-01-21 NOTE — Progress Notes (Signed)
Subjective:  [name] is a 82 y.o. year old very pleasant female patient who presents for/with See problem oriented charting ROS- No chest pain or shortness of breath. No headache. No extremity weakness. see any ROS included in HPI as well.   Past Medical History-  Patient Active Problem List   Diagnosis Date Noted  . Atrial fibrillation ([place]) 03/29/2015    Priority: High  . ICH (intracerebral hemorrhage) ([place])     Priority: High  . Essential hypertension 06/22/2006    Priority: Medium  . Dizzy spells 11/10/2014    Priority: Low  . SPINAL STENOSIS, CERVICAL 06/11/2010    Priority: Low  . Arthritis of right knee 06/22/2006    Priority: Low  . Osteopenia 06/22/2006    Priority: Low  . Reactive depression 08/14/2015  . Intention tremor 06/21/2015  . 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 Cerebral amyloid angiopathy 05/29/2015    Medications- reviewed and updated Current Outpatient Prescriptions  Medication Sig Dispense Refill  . amLODipine (NORVASC) 5 MG tablet Take 1 tablet (5 mg total) by mouth daily. 90 tablet 3  . losartan (COZAAR) 100 MG tablet Take 1 tablet (100 mg total) by mouth daily. 30 tablet 5  . Multiple Vitamin (MULTIVITAMIN) tablet Take 1 tablet by mouth daily.       No current facility-administered medications for this visit.    Objective: BP 150/90 mmHg  Pulse 70  Temp(Src) 98.1 F (36.7 C) (Oral)  Wt 122 lb (55.339 kg)  SpO2 97% Gen: NAD, resting comfortably in chair, frail and elderly  CV: Irregularly irregularno murmurs rubs or gallops Lungs: CTAB no crackles, wheeze, rhonchi Abdomen: soft/nontender/nondistended/normal bowel sounds.  Ext: trace edema Skin: warm, dry Neuro: grossly normal, moves all extremities   Assessment/Plan:  Essential hypertension S: poorly controlled. On amlodipine 2.5mg  and losartan 100mg  . 16 of 26 blood pressures are above goal [id number] systolic. Rarely diastolic above goal.   BP Readings from Last 3 Encounters:  01/21/16 150/90  10/14/15 145/71  09/27/15 132/70  A/P:Continue current meds:  But increase amlodipine to 5mg . Want to keep <140/90 with CVA history   Last visit had been up to 32 oz a night with 4-6 oz before bed. Previously 8-10 oz a day. Had urgency, frequency, nocturia 2-3x a night. If she drinks less water better. Also oil of olay liquid soap seemed to be irritant and cause frequency. She is having some incontinence which bothers her. She does not really want to wear pads. Issues are less when she drinks less. She is going to find a balance between pads and drinking less.  sHe is also struggling with where to live. Husband wants to go to assisted living but she does not want to leave her daughter's home and is concerned about finances. Extended discussion about the last 2 topics.  Return in about 3 weeks (around 02/11/2016). Return precautions advised.   Meds ordered this encounter  Medications  . amLODipine (NORVASC) 5 MG tablet    Sig: Take 1 tablet (5 mg total) by mouth daily.    Dispense:  90 tablet    Refill:  3   The duration of face-to-face time during this visit was 25 minutes. Greater than 50% of this time was spent in counseling, explanation of diagnosis, planning of further management, and/or coordination of care.     Garret Reddish, MD

## 2016-01-21 NOTE — Patient Instructions (Addendum)
Increase amlodipine back to 5mg . Continue losartan 100mg . Let's follow up in the next 2-3 weeks (schedule at check out desk)

## 2016-01-21 NOTE — Assessment & Plan Note (Signed)
S: poorly controlled. On amlodipine 2.5mg  and losartan 100mg  . 16 of 26 blood pressures are above goal [id number] systolic. Rarely diastolic above goal.  BP Readings from Last 3 Encounters:  01/21/16 150/90  10/14/15 145/71  09/27/15 132/70  A/P:Continue current meds:  But increase amlodipine to 5mg . Want to keep <140/90 with CVA history

## 2016-02-04 ENCOUNTER — Ambulatory Visit: Payer: Medicare Other | Admitting: Family Medicine

## 2016-02-12 ENCOUNTER — Other Ambulatory Visit: Payer: Self-pay | Admitting: Family Medicine

## 2016-02-18 ENCOUNTER — Ambulatory Visit: Payer: Medicare Other | Admitting: Family Medicine

## 2016-02-19 ENCOUNTER — Encounter: Payer: Self-pay | Admitting: Family Medicine

## 2016-02-19 ENCOUNTER — Ambulatory Visit (INDEPENDENT_AMBULATORY_CARE_PROVIDER_SITE_OTHER): Payer: Medicare Other | Admitting: Family Medicine

## 2016-02-19 VITALS — BP 138/72 | HR 78 | Temp 97.9°F | Ht 59.0 in | Wt 123.0 lb

## 2016-02-19 DIAGNOSIS — I10 Essential (primary) hypertension: Secondary | ICD-10-CM | POA: Diagnosis not present

## 2016-02-19 NOTE — Assessment & Plan Note (Signed)
S: controlled. In office on amlodipine 5mg  and losartan 100mg . Home readings goal <140/90 with 9/29 at goal . Other numbers typically right at 140, plus controlled in office BP Readings from Last 3 Encounters:  02/19/16 138/72  01/21/16 150/90  10/14/15 145/71  A/P:Continue current medications- controlled in office and improved control at home

## 2016-02-19 NOTE — Patient Instructions (Signed)
No changes in medicine  See me in 3-4 months or as needed

## 2016-02-19 NOTE — Progress Notes (Signed)
Pre visit review using our clinic review tool, if applicable. No additional management support is needed unless otherwise documented below in the visit note. 

## 2016-02-19 NOTE — Progress Notes (Signed)
Subjective:  [name] is a 82 y.o. year old very pleasant female patient who presents for/with See problem oriented charting ROS- No chest pain or shortness of breath. No headache. .see any ROS included in HPI as well.   Past Medical History-  Patient Active Problem List   Diagnosis Date Noted  . Atrial fibrillation ([place]) 03/29/2015    Priority: High  . ICH (intracerebral hemorrhage) ([place])     Priority: High  . Essential hypertension 06/22/2006    Priority: Medium  . Dizzy spells 11/10/2014    Priority: Low  . SPINAL STENOSIS, CERVICAL 06/11/2010    Priority: Low  . Arthritis of right knee 06/22/2006    Priority: Low  . Osteopenia 06/22/2006    Priority: Low  . Reactive depression 08/14/2015  . Intention tremor 06/21/2015  . 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 Cerebral amyloid angiopathy 05/29/2015    Medications- reviewed and updated Current Outpatient Prescriptions  Medication Sig Dispense Refill  . amLODipine (NORVASC) 5 MG tablet Take 1 tablet (5 mg total) by mouth daily. 90 tablet 3  . losartan (COZAAR) 100 MG tablet TAKE 1 TABLET (100 MG TOTAL) BY MOUTH DAILY. 30 tablet 5  . Multiple Vitamin (MULTIVITAMIN) tablet Take 1 tablet by mouth daily.       No current facility-administered medications for this visit.    Objective: BP 138/72 mmHg  Pulse 78  Temp(Src) 97.9 F (36.6 C) (Oral)  Ht 4\' 11"  (1.499 m)  Wt 123 lb (55.792 kg)  BMI 24.83 kg/m2  SpO2 97% Gen: NAD, resting comfortably CV: RRR no murmurs rubs or gallops Lungs: CTAB no crackles, wheeze, rhonchi Ext: no edema Skin: warm, dry Neuro: grossly normal, moves all extremities, slow gait , slowly rises  Assessment/Plan:  Essential hypertension S: controlled. In office on amlodipine 5mg  and losartan 100mg . Home readings goal <140/90 with 9/29 at goal . Other numbers typically right at 140, plus controlled in office BP Readings from Last 3 Encounters:   02/19/16 138/72  01/21/16 150/90  10/14/15 145/71  A/P:Continue current medications- controlled in office and improved control at home   Four-month follow-up as long as blood pressure is better controlled  Return precautions advised.  Garret Reddish, MD

## 2016-03-05 ENCOUNTER — Ambulatory Visit: Payer: Medicare Other | Admitting: Neurology

## 2016-03-31 ENCOUNTER — Ambulatory Visit: Payer: Medicare Other | Admitting: Neurology

## 2016-04-16 ENCOUNTER — Ambulatory Visit: Payer: Medicare Other | Admitting: Neurology

## 2016-04-16 ENCOUNTER — Encounter: Payer: Self-pay | Admitting: Neurology

## 2016-04-23 ENCOUNTER — Telehealth: Payer: Self-pay | Admitting: Neurology

## 2016-04-23 NOTE — Telephone Encounter (Signed)
Patient has an appt reschedule with Dr. Erlinda Hong in 05/2016.

## 2016-04-23 NOTE — Telephone Encounter (Signed)
Pt called in to apologize for not coming to her appt. She depends on other people for transportation and her caregiver cancelled on her the night before. She would still like to speak with the physician . Please call

## 2016-05-22 ENCOUNTER — Ambulatory Visit (INDEPENDENT_AMBULATORY_CARE_PROVIDER_SITE_OTHER): Payer: Medicare Other | Admitting: Emergency Medicine

## 2016-05-22 DIAGNOSIS — Z23 Encounter for immunization: Secondary | ICD-10-CM

## 2016-06-23 ENCOUNTER — Encounter: Payer: Self-pay | Admitting: Neurology

## 2016-06-23 ENCOUNTER — Ambulatory Visit (INDEPENDENT_AMBULATORY_CARE_PROVIDER_SITE_OTHER): Payer: Medicare Other | Admitting: Neurology

## 2016-06-23 VITALS — BP 126/69 | HR 76 | Wt 126.8 lb

## 2016-06-23 DIAGNOSIS — I482 Chronic atrial fibrillation, unspecified: Secondary | ICD-10-CM

## 2016-06-23 DIAGNOSIS — I611 Nontraumatic intracerebral hemorrhage in hemisphere, cortical: Secondary | ICD-10-CM | POA: Diagnosis not present

## 2016-06-23 DIAGNOSIS — I680 Cerebral amyloid angiopathy: Secondary | ICD-10-CM | POA: Diagnosis not present

## 2016-06-23 DIAGNOSIS — E854 Organ-limited amyloidosis: Secondary | ICD-10-CM

## 2016-06-23 DIAGNOSIS — I10 Essential (primary) hypertension: Secondary | ICD-10-CM | POA: Diagnosis not present

## 2016-06-23 NOTE — Patient Instructions (Signed)
-   avoid any antiplatelet or anticoagulation due to concerns of CAA and the recurrent ICH.  - check BP at home, BP goal < 140 to avoid high risk of bleeding - recommend hearing aid - relaxation and lack of worries - Follow up with your primary care physician for stroke risk factor modification. Recommend maintain blood pressure goal <140/80, diabetes with hemoglobin A1c goal below 6.5% and lipids with LDL cholesterol goal below 70 mg/dL.  - follow up as needed

## 2016-06-23 NOTE — Progress Notes (Signed)
STROKE NEUROLOGY FOLLOW UP NOTE  NAME: [name] DOB: September 28, 1917  REASON FOR VISIT: stroke follow up HISTORY FROM: daughter and chart  Today we had the pleasure of seeing [name] in follow-up at our Neurology Clinic. Pt was accompanied by daughter.   History Summary [name] is a 82 y.o. female with history of HTN and left Bell's palsy many years ago presenting with altered mental status, left hemiparesis, dysarthria and admitted on 03/19/2015.  MRI and CT showed large right temporal ICH with IVH and cerebral edema. Etiology not clear, either hypertensive versus CAA.  Repeat CT stable. However, she developed altered mental status,  very lethargic, found to have UTI , and was treated with antibodies.  EEG showed frequent right frontal sharp waves , and she was put on Keppra. Repeat EEG showed triphasic waves consistent with encephalopathy , which likely due to UTI.  Patient also was found to have A. Fib , and bradycardia with asystole episode. However she was not a candidate for anticoagulation at the time.  She was then discharged to CIR after stabilization.  05/29/15 follow up - the patient has been doing well. She stayed in CIR for about two weeks, and then discharged to home with [facility] which still continues until now. She is weaned off keppra in [place]. Currently only on losartan for BP control. Today BP 139/72.   09/05/15 follow up - the pt has been doing well from stroke standpoint. No new stroke like symptoms. However, she did have hallucination early December. Pt seeing and hearing thing or people but not there. Pt was aware things were not real. CT head no acute changes and blood work largely unremarkable. No UTI. Now retrospectively, the event likely due to her hearing aid issue, causing her lack of auditory input and started to have auditory hallucination and then visual hallucination. Since the hearing aid issue resolved, no more hallucination episodes.  She was found  to have mild depression on Dr. Naaman Plummer visit, put on low dose lexapro but she not able to tolerate due to imbalance and lethargy. It was stopped and she is back to baseline. Today in clinic, she was pleasant and mentally at baseline. BP 121/55. No complains.  Interval History During the interval time, pt has been doing fine. Stable from stroke standpoint. Pt complains of not feeling well if not enough sleep or too much stress. She may get mild frontal HA from them. She thinks her memory is not as good as before but she admitted that she worries too much which makes her lack of concentration. She still has hearing loss and sometimes lost during conversation. She had to return her hearing aids and waiting for a new one. BP 126/69.   REVIEW OF SYSTEMS: Full 14 system review of systems performed and notable only for those listed below and in HPI above, all others are negative:  Constitutional:   Cardiovascular:  Ear/Nose/Throat:  Hearing loss Skin:  Eyes:   Respiratory:   Gastroitestinal:   Genitourinary: incontinence of bladder, frequency of urination Hematology/Lymphatic:   Endocrine: flushing Musculoskeletal:  Joint pain Allergy/Immunology:   Neurological:  Memory loss, dizziness, HA Psychiatric: agitation, confusion Sleep: frequent waking, daytime sleepiness  The following represents the patient's updated allergies and side effects list: Allergies  Allergen Reactions  . Ace Inhibitors     REACTION: angioedema    The neurologically relevant items on the patient's problem list were reviewed on today's visit.  Neurologic Examination  A  problem focused neurological exam (12 or more points of the single system neurologic examination, vital signs counts as 1 point, cranial nerves count for 8 points) was performed.  Blood pressure 126/69, pulse 76, weight 126 lb 12.8 oz (57.5 kg).  General - Well nourished, well developed, in no apparent distress.  Ophthalmologic - Fundi not visualized  due to eye movement.  Cardiovascular - irregularly irregular heart rhythm, and bradycardia.  Mental Status -  Level of arousal and orientation to time, place, and person were intact. Language including expression, naming, repetition, comprehension was assessed and found intact. Fund of Knowledge was assessed and was impaired.  Cranial Nerves II - XII - II - Visual field intact OU. III, IV, VI - Extraocular movements intact. V - Facial sensation intact bilaterally. VII - Facial movement decreased on the left, left eyelid smaller. VIII - hard of hearing & vestibular intact bilaterally. X - Palate elevates symmetrically. XI - Chin turning & shoulder shrug intact bilaterally. XII - Tongue protrusion intact.  Motor Strength - The patient's strength was 4/5 in all extremities and pronator drift was absent.  Bulk was normal and fasciculations were absent.   Motor Tone - Muscle tone was assessed at the neck and appendages and was normal.  Reflexes - The patient's reflexes were 1+ in all extremities and she had no pathological reflexes.  Sensory - Light touch, temperature/pinprick were assessed and were normal.    Coordination - The patient had normal movements in the hands and feet with no ataxia or dysmetria.  Tremor was absent.  Gait and Station - walk without device but with daughter assistance, stooped posturing, slow, small stride.  Data reviewed: I personally reviewed the images and agree with the radiology interpretations.  Ct Head Wo Contrast 03/24/15 - Unchanged exam with right temporal intraparenchymal hemorrhage, 6 mm right to left midline shift, focal 4 mm right anterior parafalcine subdural hematoma and intraventricular hemorrhage again noted.  03/22/2015 : Expected evolution of multifocal intracranial hemorrhage without significant interval progression. The small subdural hemorrhage overlying the right temporal lobe is significantly less conspicuous on today's exam, the  intraparenchymal hemorrhage within the right temporal lobe and small volume intraventricular hemorrhage layering within both occipital horns are essentially unchanged.  03/20/2015 1. No significant interval change in size of right temporal lobe intraparenchymal hemorrhage with slightly increased localized vasogenic edema. 2. No significant interval change in acute subdural hematoma overlying the right cerebral convexity measuring up to 5 mm in maximal thickness. Associated 6 mm of right-to-left shift is not significantly changed. 3. Small volume intraventricular hemorrhage, slightly increased from prior. Asymmetric dilatation of the left lateral ventricle is stable. No hydrocephalus. 4. No new intracranial process.   03/19/2015 Large intraparenchymal hemorrhage of the right temporal lobe with a 6 mm right frontal temporal acute extra-axial hemorrhage. There is right to left midline shift.   03/27/15 - No new hemorrhage. No increase in mass effect. Right temporal hematoma undergoing expected evolutionary changes, becoming less dense and less distinct.  07/30/15 - 1. No acute finding. 2. Right temporal lobe gliosis related to large remote hemorrhage.  Carolyn Brain Wo Contrast 03/20/2015 1. Stable size of large right temporal lobe parenchymal hemorrhage with layering blood products. 2. Similar appearance of diffuse vasogenic edema and 8 mm midline shift. 3. Stable small right extra-axial hemorrhage. 4. Blood products layering within the ventricles bilaterally without hydrocephalus. 5. Stable chronic small vessel T2 changes.   [name] Head Wo Contrast 03/20/2015 6. The MRA demonstrates displacement of  the MCA branch vessels without a focal lesion to explain the hemorrhage. 7. Diffuse small vessel disease is present on the MRA.   Dg Chest Port 1 View 03/25/2015 1. Bilateral medial lung base opacity, mild, likely representing atelectasis although possibility of pneumonia not excluded and follow-up  recommended. 2. Aortic atherosclerotic calcification 03/20/2015 Left internal jugular catheter is noted with distal tip in expected position the SVC. No acute cardiopulmonary abnormality seen.  03/28/2015 No acute cardiopulmonary disease. Chest is stable from prior exam.  2D echo - Left ventricle: The cavity size was normal. Wall thickness wasincreased in a pattern of mild LVH. There was focal basalhypertrophy. Systolic function was vigorous. The estimatedejection fraction was in the range of 65% to 70%. Wall motion was normal; there were no regional wall motion abnormalities. - Aortic valve: Moderately calcified annulus. - Mitral valve: Severely calcified annulus. There was mild regurgitation. Valve area by continuity equation (using LVOT flow): 1.47 cm^2. - Left atrium: The atrium was moderately dilated.  EEG  03/25/15 -  1) Frequent right frontal sharp waves Clinical Interpretation: This EEG is consistent with a region of potential epileptogenicity in the right frontal region). No seizure was recorded.  03/26/15  1) Triphasic waves 2) Generalized slow activity.  Clinical Interpretation: This EEG is consistent with an encephalopathy. Triphasic waves are associai]ted with many kinds of toxic/metabolic encephalopathies including infection, hepatic failure, renal failure, medication effect among other causes.   Assessment: As you may recall, she is a 82 y.o. Caucasian female with PMH of hypertension and left Bell's palsy many years ago was admitted on 03/19/2015 for large right temporal ICH with IVH and cerebral edema. Etiology not clear, concerning for CAA. Hospitalization complicated with UTI and encephalopathy, and was treated with antibodies.  EEG showed frequent right frontal sharp waves, and she was put on Keppra. Repeat EEG showed triphasic waves consistent with encephalopathy , which likely due to UTI.  Patient also was found to have A. Fib , and bradycardia with asystole  episode. However she was not candidate for anticoagulation at the time.  She was then discharged to CIR after stabilization. Her Keppra was discontinued in CIR. There is dilemma in her medical management. She was not on aspirin 81 until 2 weeks prior to the current [place] event, concerning for CAA. However, patient does have atrial fibrillation and needs anticoagulation for stroke prevention. Discussed with daughter in clinic, she prefer patient not to be on any antiplatelet or anticoagulation in fear of further ICH.  During the interval time she was doing well. Had episode of auditory and visual hallucination due to hearing aid issue and mild depression not tolerating lexapro. Now back to baseline. Still has hearing loss and waiting for new hearing aid. Complains of worsening memory, not able to handle stress and fatigue but she does worry a lot.   Plan:  - avoid antiplatelet or anticoagulation due to concerns of CAA and the recurrent ICH.  - although patient has afib but not a good candidate for anticoagulation. - check BP at home, BP goal < 140 to avoid high risk of bleeding - recommend working on hearing aids - relaxation and lack of worries - Follow up with your primary care physician for stroke risk factor modification. Recommend maintain blood pressure goal <140/80, diabetes with hemoglobin A1c goal below 6.5% and lipids with LDL cholesterol goal below 70 mg/dL.  - follow up as needed   No orders of the defined types were placed in this encounter.   No  orders of the defined types were placed in this encounter.   Patient Instructions  - avoid any antiplatelet or anticoagulation due to concerns of CAA and the recurrent ICH.  - check BP at home, BP goal < 140 to avoid high risk of bleeding - recommend hearing aid - relaxation and lack of worries - Follow up with your primary care physician for stroke risk factor modification. Recommend maintain blood pressure goal <140/80, diabetes with  hemoglobin A1c goal below 6.5% and lipids with LDL cholesterol goal below 70 mg/dL.  - follow up as needed   [name], MD PhD [facility] [address] [place], [place] [address] [phone]

## 2016-07-06 ENCOUNTER — Other Ambulatory Visit: Payer: Self-pay

## 2016-07-06 MED ORDER — LOSARTAN POTASSIUM 100 MG PO TABS: ORAL_TABLET | ORAL | 5 refills | Status: DC

## 2016-09-09 ENCOUNTER — Ambulatory Visit: Payer: Medicare Other | Admitting: Family Medicine

## 2016-09-20 IMAGING — CR DG CHEST 1V PORT
1 series · 1 of 1 positions shown · non-contrast
Comparison: None.

CLINICAL DATA: Central line clotted, initial encounter.

EXAM:
PORTABLE CHEST - 1 VIEW

[AP]
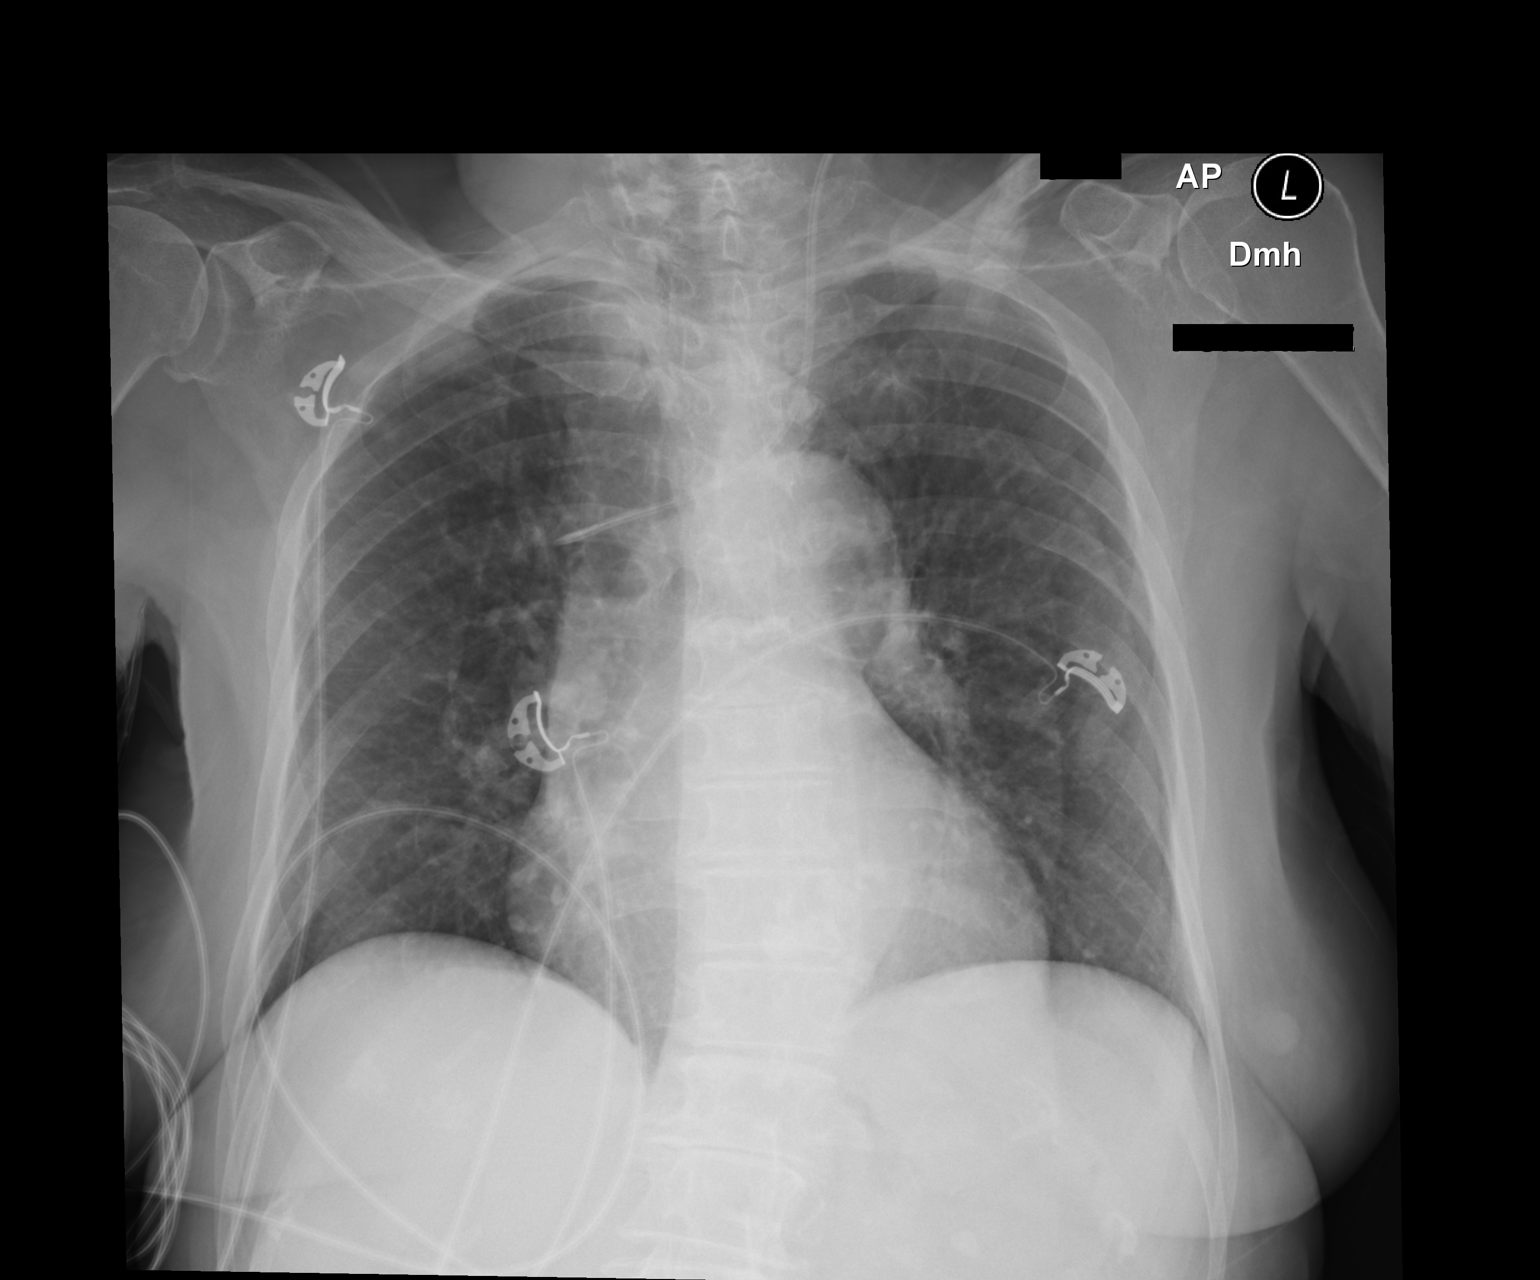

[1 of 1 positions shown; findings below may reference images not displayed]

FINDINGS: The heart size and mediastinal contours are within normal limits.
Both lungs are clear. No pneumothorax or pleural effusion is noted.
Left internal jugular catheter is noted with distal tip in expected
position of the SVC. The visualized skeletal structures are
unremarkable.
IMPRESSION: Left internal jugular catheter is noted with distal tip in expected
position the SVC. No acute cardiopulmonary abnormality seen.

## 2016-09-20 IMAGING — CT CT HEAD W/O CM
2 series · 15 of 30 positions shown, 19 images · non-contrast
Comparison: Prior study from 03/19/2015

CLINICAL DATA: Follow-up examination for intraparenchymal
hemorrhage.

EXAM:
CT HEAD WITHOUT CONTRAST
TECHNIQUE: Contiguous axial images were obtained from the base of the skull
through the vertex without intravenous contrast.

[Series 201: head w/o, idose (1) · axial · non-contrast · 0.49mm/px · z∈[+57,+177]mm · 13 of 28 slices shown, 17 images]
[im 2/28  brain]
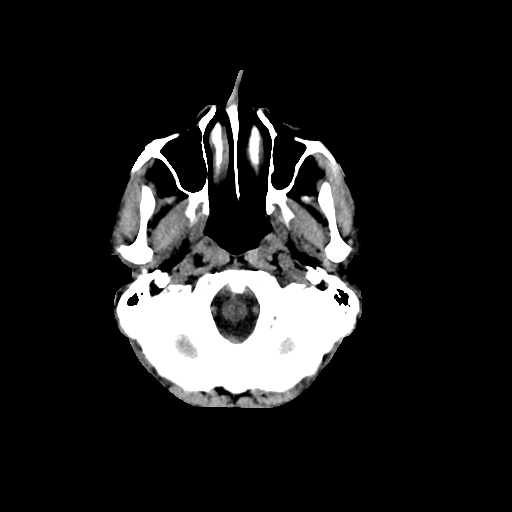
[im 2/28  bone]
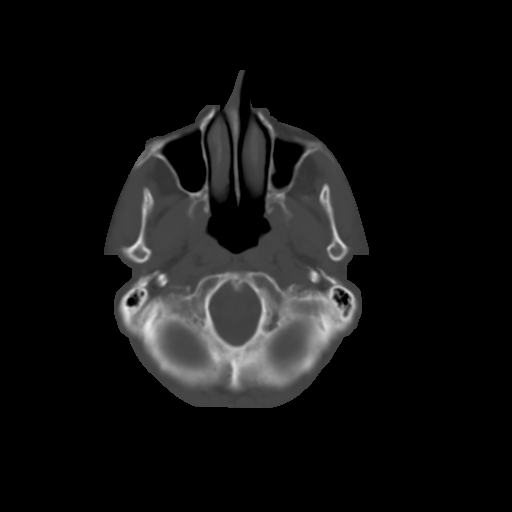
[im 4/28  brain]
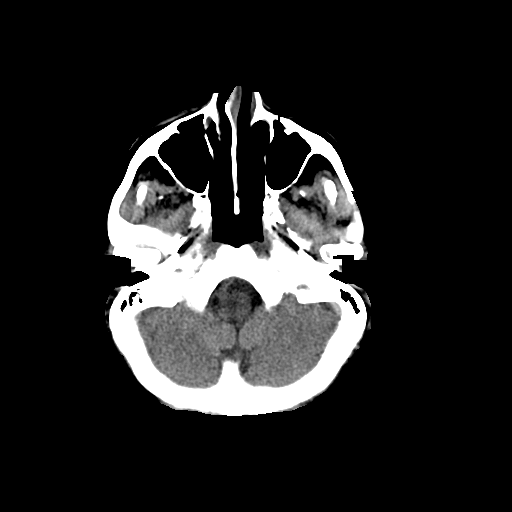
[im 6/28  brain]
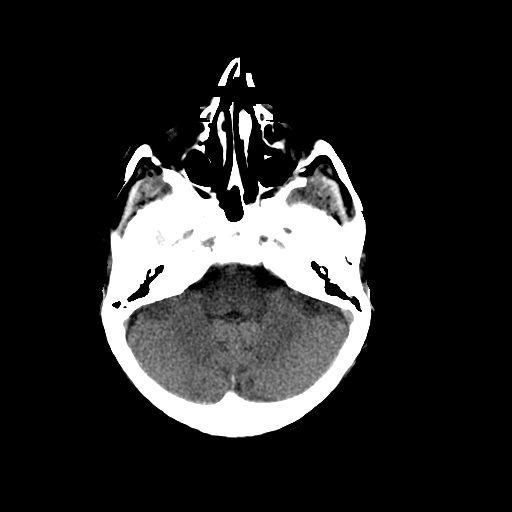
[im 8/28  brain]
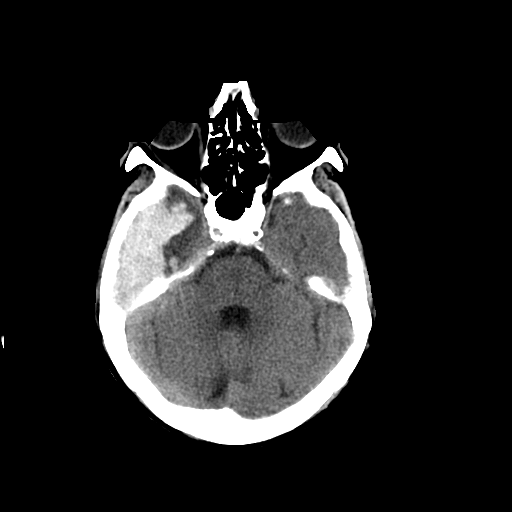
[im 10/28  brain]
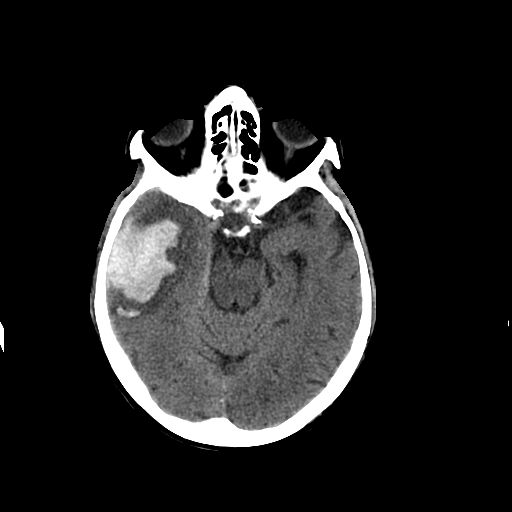
[im 10/28  bone]
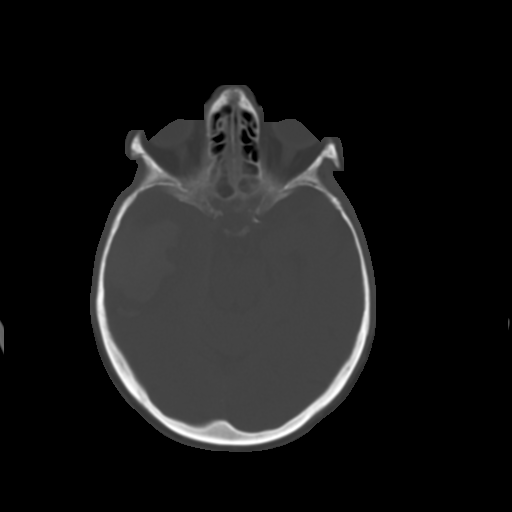
[im 12/28  brain]
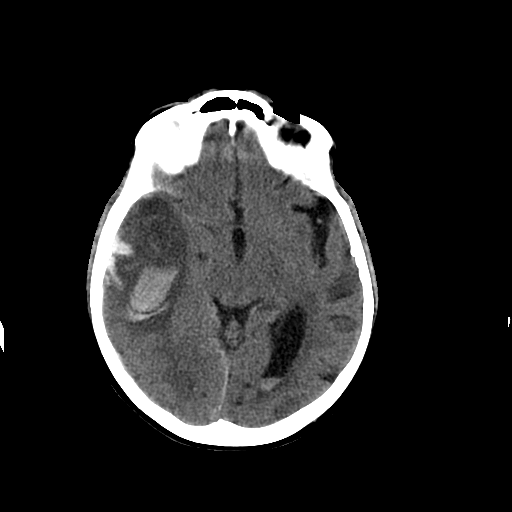
[im 14/28  brain]
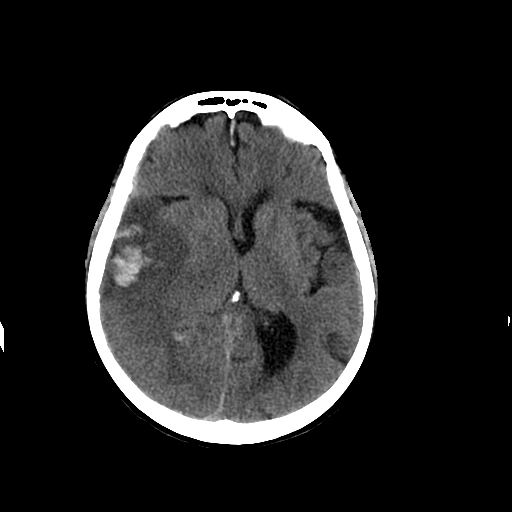
[im 16/28  brain]
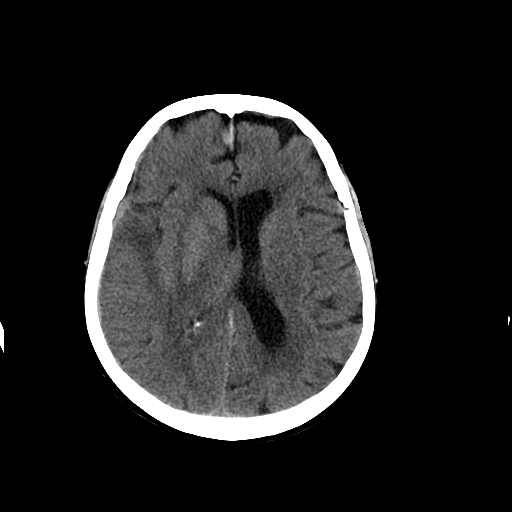
[im 18/28  brain]
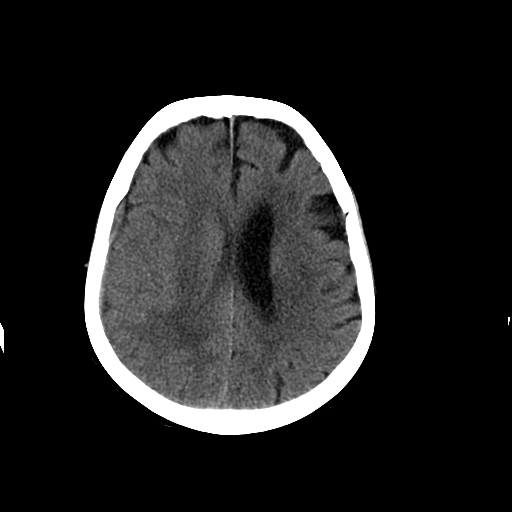
[im 18/28  bone]
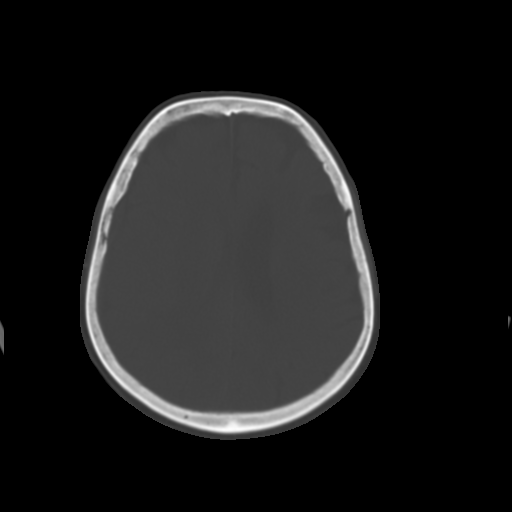
[im 20/28  brain]
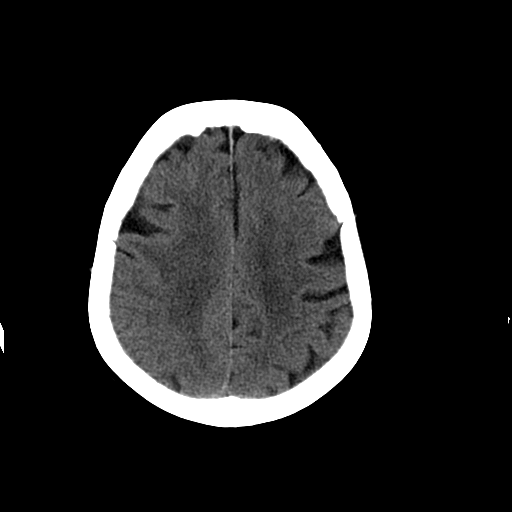
[im 22/28  brain]
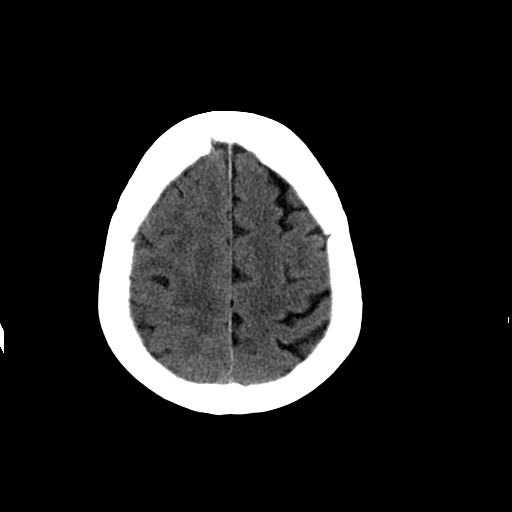
[im 24/28  brain]
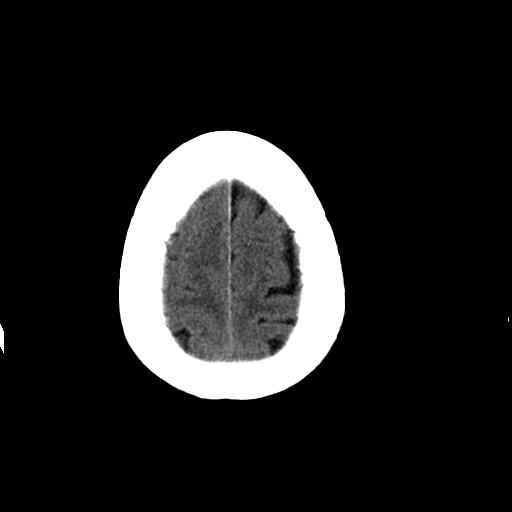
[im 26/28  brain]
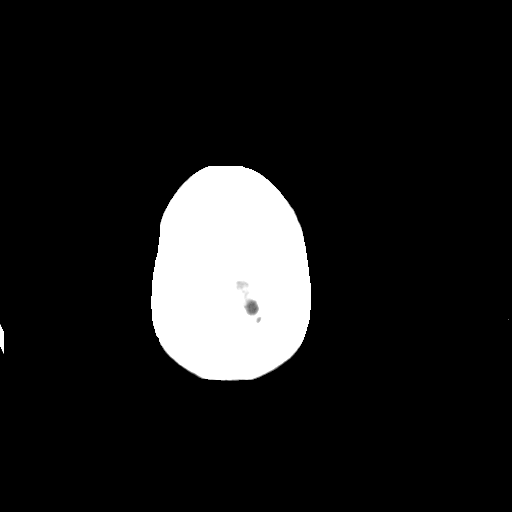
[im 26/28  bone]
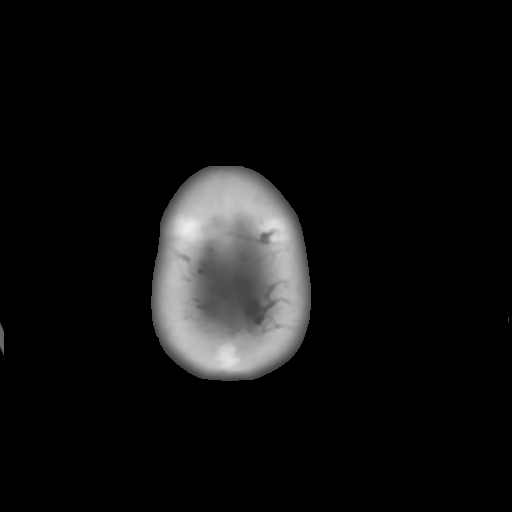

[Series 202: head w/o bone, idose (1) · axial · non-contrast · 0.49mm/px · z∈[+57,+77]mm · 2 of 28 slices shown]
[im 2/28  bone]
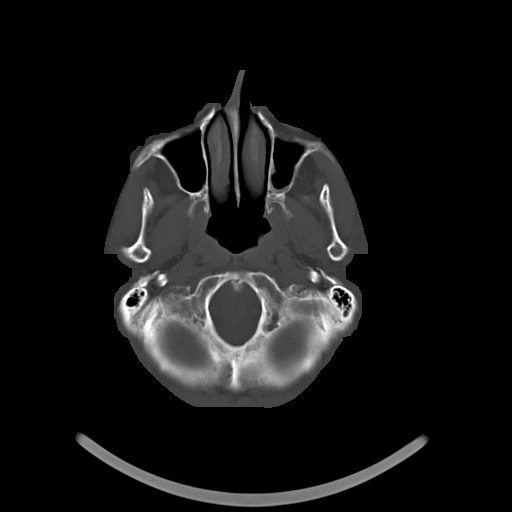
[im 6/28  bone]
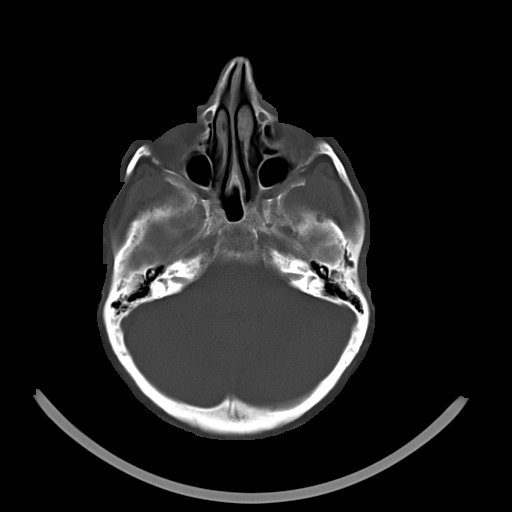

[15 of 30 positions shown; findings below may reference images not displayed]

FINDINGS: Previous identified intraparenchymal hemorrhage centered in the
right temporal lobe measures approximately 5.9 x 2.2 cm on today's
study, felt to be overall little interval changed from previous.
Surrounding vasogenic edema is slightly increased. Diffuse edema
throughout the right cerebral hemisphere with sulcal effacement is
similar.

Subdural hemorrhage overlying the U right frontal temporal convexity
measures up to 5 mm in maximal thickness, not significantly changed.
Small amount of hemorrhage along the anterior falx. Blood along the
right tentorium as well. Trace extra-axial hemorrhage overlying the
left frontal lobe. There is persistent 6 mm of right-to-left shift
with partial effacement of the right lateral ventricle. Asymmetric
dilatation of the left lateral ventricle is not significantly
changed. Layering intraventricular hemorrhage within the occipital
horns bilaterally, slightly increased.

No new intracranial hemorrhage. No acute large vessel territory
infarct.

Both scalp soft tissues within normal limits. No acute abnormality
about the orbits.

Scattered mucosal thickening within the ethmoidal air cells.
Paranasal sinuses are otherwise clear. No mastoid effusion.

Calvarium intact.
IMPRESSION: 1. No significant interval change in size of right temporal lobe
intraparenchymal hemorrhage with slightly increased localized
vasogenic edema.
2. No significant interval change in acute subdural hematoma
overlying the right cerebral convexity measuring up to 5 mm in
maximal thickness. Associated 6 mm of right-to-left shift is not
significantly changed.
3. Small volume intraventricular hemorrhage, slightly increased from
prior. Asymmetric dilatation of the left lateral ventricle is
stable. No hydrocephalus.
4. No new intracranial process.

## 2016-10-19 DIAGNOSIS — H538 Other visual disturbances: Secondary | ICD-10-CM | POA: Diagnosis not present

## 2016-10-19 DIAGNOSIS — Z961 Presence of intraocular lens: Secondary | ICD-10-CM | POA: Diagnosis not present

## 2016-10-19 DIAGNOSIS — H43812 Vitreous degeneration, left eye: Secondary | ICD-10-CM | POA: Diagnosis not present

## 2016-10-19 DIAGNOSIS — H353132 Nonexudative age-related macular degeneration, bilateral, intermediate dry stage: Secondary | ICD-10-CM | POA: Diagnosis not present

## 2016-10-19 DIAGNOSIS — H35363 Drusen (degenerative) of macula, bilateral: Secondary | ICD-10-CM | POA: Diagnosis not present

## 2016-10-23 ENCOUNTER — Encounter: Payer: Self-pay | Admitting: Family Medicine

## 2016-10-23 ENCOUNTER — Ambulatory Visit (INDEPENDENT_AMBULATORY_CARE_PROVIDER_SITE_OTHER): Payer: Medicare Other | Admitting: Family Medicine

## 2016-10-23 VITALS — BP 136/84 | HR 73 | Temp 97.9°F | Ht 59.0 in | Wt 133.4 lb

## 2016-10-23 DIAGNOSIS — I482 Chronic atrial fibrillation, unspecified: Secondary | ICD-10-CM

## 2016-10-23 DIAGNOSIS — F329 Major depressive disorder, single episode, unspecified: Secondary | ICD-10-CM

## 2016-10-23 DIAGNOSIS — I10 Essential (primary) hypertension: Secondary | ICD-10-CM

## 2016-10-23 NOTE — Assessment & Plan Note (Signed)
S: controlled at home with vast majority of readings below 140/90. diastolics all under 80. Has had some in 150s but also as low as 109.  On amlodipine 5mg  and losartan 100mg .  BP Readings from Last 3 Encounters:  10/23/16 (!) 142/78  06/23/16 126/69  02/19/16 138/72  A/P:Continue current meds:  Do not think patient could tolerate dose increase. Will reduce home monitoring to 3 days a week when caretaker there. <140/80 goal

## 2016-10-23 NOTE — Patient Instructions (Addendum)
Could try biofreeze for the knee pain. If really bothering you can take the acetaminophen  Blood pressures look great. Continue current medicine. May reduce checks to 3x a week with goal <140/80  Lets do 3 month visit- schedule as a physical and come fasting and we will update labs

## 2016-10-23 NOTE — Assessment & Plan Note (Signed)
History of this after stroke. Doing much better. Did need extended counseling about dealing with husband who seems to complain a lot. Could consider counseling in future with behavioral health if preferred but honestly she is doing phenomenally well despite this

## 2016-10-23 NOTE — Assessment & Plan Note (Addendum)
S: rate controlled without medicine. Not on anticoagulation or antiplatelet due to history intracerebral hemorrhage.  A/P: continue without medicine. No rate control as long as HR doing as well as it is

## 2016-10-23 NOTE — Progress Notes (Signed)
Subjective:  [name] is a 83 y.o. year old very pleasant female patient who presents for/with See problem oriented charting ROS- has some bilateral knee pain at times, no chest pain or shortness of breath. No headache.    Past Medical History-  Patient Active Problem List   Diagnosis Date Noted  . Atrial fibrillation ([place]) 03/29/2015    Priority: High  . History of intracerebral hemorrhage      Priority: High  . Essential hypertension 06/22/2006    Priority: Medium  . Dizzy spells 11/10/2014    Priority: Low  . SPINAL STENOSIS, CERVICAL 06/11/2010    Priority: Low  . Arthritis of right knee 06/22/2006    Priority: Low  . Osteopenia 06/22/2006    Priority: Low  . Reactive depression 08/14/2015  . Intention tremor 06/21/2015  . Cerebral amyloid angiopathy ([place]) 05/29/2015    Medications- reviewed and updated Current Outpatient Prescriptions  Medication Sig Dispense Refill  . amLODipine (NORVASC) 5 MG tablet Take 1 tablet (5 mg total) by mouth daily. 90 tablet 3  . losartan (COZAAR) 100 MG tablet TAKE 1 TABLET (100 MG TOTAL) BY MOUTH DAILY. 30 tablet 5  . Multiple Vitamin (MULTIVITAMIN) tablet Take 1 tablet by mouth daily.       No current facility-administered medications for this visit.     Objective: BP 136/84   Pulse 73   Temp 97.9 F (36.6 C) (Oral)   Ht 4\' 11"  (1.499 m)   Wt 133 lb 6.4 oz (60.5 kg)   SpO2 96%   BMI 26.94 kg/m  Gen: NAD, resting comfortably, appears younger than stated age CV: irregularly irregular  no murmurs rubs or gallops Lungs: CTAB no crackles, wheeze, rhonchi Ext: trace edema Skin: warm, dry Neuro: grossly normal, moves all extremities  Assessment/Plan:  Essential hypertension S: controlled at home with vast majority of readings below 140/90. diastolics all under 80. Has had some in 150s but also as low as 109.  On amlodipine 5mg  and losartan 100mg .  BP Readings from Last 3 Encounters:  10/23/16 (!) 142/78  06/23/16 126/69   02/19/16 138/72  A/P:Continue current meds:  Do not think patient could tolerate dose increase. Will reduce home monitoring to 3 days a week when caretaker there. <140/80 goal  Atrial fibrillation ([place]) S: rate controlled without medicine. Not on anticoagulation or antiplatelet due to history intracerebral hemorrhage.  A/P: continue without medicine. No rate control as long as HR doing as well as it is   Reactive depression History of this after stroke. Doing much better. Did need extended counseling about dealing with husband who seems to complain a lot. Could consider counseling in future with behavioral health if preferred but honestly she is doing phenomenally well despite this  The duration of face-to-face time during this visit was greater than 25 minutes. Greater than 50% of this time was spent in counseling, explanation of diagnosis, planning of further management, and/or coordination of care including primarily counseling about stressors of dealing with husband who complains a lot in her words.    Return precautions advised.  Garret Reddish, MD

## 2016-10-23 NOTE — Progress Notes (Signed)
Pre visit review using our clinic review tool, if applicable. No additional management support is needed unless otherwise documented below in the visit note. 

## 2016-12-30 ENCOUNTER — Other Ambulatory Visit: Payer: Self-pay | Admitting: Family Medicine

## 2017-01-08 ENCOUNTER — Ambulatory Visit (INDEPENDENT_AMBULATORY_CARE_PROVIDER_SITE_OTHER): Payer: Medicare Other | Admitting: Family Medicine

## 2017-01-08 ENCOUNTER — Encounter: Payer: Self-pay | Admitting: Family Medicine

## 2017-01-08 DIAGNOSIS — I10 Essential (primary) hypertension: Secondary | ICD-10-CM | POA: Diagnosis not present

## 2017-01-08 NOTE — Patient Instructions (Signed)
I am so sorry you had that scary situation happen to you- seems like you did a great job taking care of it  Blood pressures overall look pretty good. Continue current medicines. Blood pressure up slightly in office but I think you were worked up thinking about your prior situation. I am really reassured by your home readings.   I want you to consider meeting with Dr. Glennon Hamilton. I think having someone to talk to about all your stressors could be helpful.

## 2017-01-08 NOTE — Assessment & Plan Note (Signed)
S: controlled on amlodipine 5mg  and losartan 100mg  at home.  Home readings vary from 96-158 over 52-83. Only 1 reading under 100- drank OJ and better. 13/17 of last few readings <140. All diastolics controlled. Other readings at 141,142, 148.    BP Readings from Last 3 Encounters:  01/08/17 (!) 152/80  10/23/16 136/84  06/23/16 126/69  A/P:Continue current meds:  With some readings as low as 100- despite slightly high reading today- do not think we can titrate medicatoin up. Majority of home readings <140 and all under 90. Most of diastolics in 68G and 64G- once again do not think we could titrate  We spent most of our visit discussing a scary experience at home (was worried she was being scammed by someone with [employer] but fortunately was not). I suspect blood pressure up today as she was focused on this encounter. Fortunately able to counsel her- also once again advised her to consider meeting with Dr. Glennon Hamilton

## 2017-01-08 NOTE — Progress Notes (Signed)
Subjective:  [name] is a 83 y.o. year old very pleasant female patient who presents for/with See problem oriented charting ROS- No chest pain or shortness of breath. No headache or blurry vision. Did hve some lower blood pressures which improved with orange juice   Past Medical History-  Patient Active Problem List   Diagnosis Date Noted  . Atrial fibrillation ([place]) 03/29/2015    Priority: High  . History of intracerebral hemorrhage      Priority: High  . Essential hypertension 06/22/2006    Priority: Medium  . Dizzy spells 11/10/2014    Priority: Low  . SPINAL STENOSIS, CERVICAL 06/11/2010    Priority: Low  . Arthritis of right knee 06/22/2006    Priority: Low  . Osteopenia 06/22/2006    Priority: Low  . Reactive depression 08/14/2015  . Intention tremor 06/21/2015  . Cerebral amyloid angiopathy ([place]) 05/29/2015    Medications- reviewed and updated Current Outpatient Prescriptions  Medication Sig Dispense Refill  . amLODipine (NORVASC) 5 MG tablet Take 1 tablet (5 mg total) by mouth daily. 90 tablet 3  . losartan (COZAAR) 100 MG tablet TAKE 1 TABLET BY MOUTH DAILY 90 tablet 1  . Multiple Vitamin (MULTIVITAMIN) tablet Take 1 tablet by mouth daily.       No current facility-administered medications for this visit.     Objective: BP (!) 152/80 (BP Location: Left Arm, Patient Position: Sitting, Cuff Size: Large)   Pulse 71   Temp 98 F (36.7 C) (Oral)   Ht 4\' 11"  (1.499 m)   Wt 133 lb (60.3 kg)   SpO2 97%   BMI 26.86 kg/m  Gen: NAD, resting comfortably. She is alone today CV: RRR no murmurs rubs or gallops Lungs: CTAB no crackles, wheeze, rhonchi  Ext: no edema Skin: warm, dry Neuro: normal gait  Assessment/Plan:  Essential hypertension S: controlled on amlodipine 5mg  and losartan 100mg  at home.  Home readings vary from 96-158 over 52-83. Only 1 reading under 100- drank OJ and better. 13/17 of last few readings <140. All diastolics controlled. Other  readings at 141,142, 148.    BP Readings from Last 3 Encounters:  01/08/17 (!) 152/80  10/23/16 136/84  06/23/16 126/69  A/P:Continue current meds:  With some readings as low as 100- despite slightly high reading today- do not think we can titrate medicatoin up. Majority of home readings <140 and all under 90. Most of diastolics in 58I and 32P- once again do not think we could titrate  We spent most of our visit discussing a scary experience at home (was worried she was being scammed by someone with [employer] but fortunately was not). I suspect blood pressure up today as she was focused on this encounter. Fortunately able to counsel her- also once again advised her to consider meeting with Dr. Glennon Hamilton  The duration of face-to-face time during this visit was greater than 15 minutes. Greater than 50% of this time was spent in counseling, explanation of diagnosis, planning of further management, and/or coordination of care including discussion of the difficult event at home and how this affected her  Return precautions advised.  Garret Reddish, MD

## 2017-01-24 ENCOUNTER — Other Ambulatory Visit: Payer: Self-pay | Admitting: Family Medicine

## 2017-01-25 ENCOUNTER — Ambulatory Visit: Payer: Medicare Other | Admitting: Family Medicine

## 2017-04-28 ENCOUNTER — Encounter: Payer: Self-pay | Admitting: Family Medicine

## 2017-04-28 ENCOUNTER — Ambulatory Visit (INDEPENDENT_AMBULATORY_CARE_PROVIDER_SITE_OTHER): Payer: Medicare Other | Admitting: Family Medicine

## 2017-04-28 VITALS — BP 136/82 | HR 64 | Temp 98.2°F | Ht 59.0 in | Wt 133.0 lb

## 2017-04-28 DIAGNOSIS — I10 Essential (primary) hypertension: Secondary | ICD-10-CM | POA: Diagnosis not present

## 2017-04-28 DIAGNOSIS — R32 Unspecified urinary incontinence: Secondary | ICD-10-CM | POA: Diagnosis not present

## 2017-04-28 DIAGNOSIS — Z23 Encounter for immunization: Secondary | ICD-10-CM

## 2017-04-28 LAB — CBC
HCT: 42.9 % (ref 36.0–46.0)
Hemoglobin: 14.3 g/dL (ref 12.0–15.0)
MCHC: 33.2 g/dL (ref 30.0–36.0)
MCV: 91.9 fl (ref 78.0–100.0)
Platelets: 183 10*3/uL (ref 150.0–400.0)
RBC: 4.66 Mil/uL (ref 3.87–5.11)
RDW: 14.1 % (ref 11.5–15.5)
WBC: 7.8 10*3/uL (ref 4.0–10.5)

## 2017-04-28 LAB — COMPREHENSIVE METABOLIC PANEL
ALBUMIN: 4.2 g/dL (ref 3.5–5.2)
ALK PHOS: 66 U/L (ref 39–117)
ALT: 12 U/L (ref 0–35)
AST: 18 U/L (ref 0–37)
BUN: 18 mg/dL (ref 6–23)
CALCIUM: 9.2 mg/dL (ref 8.4–10.5)
CHLORIDE: 104 meq/L (ref 96–112)
CO2: 28 mEq/L (ref 19–32)
Creatinine, Ser: 0.89 mg/dL (ref 0.40–1.20)
GFR: 62.3 mL/min (ref >60.00)
Glucose, Bld: 87 mg/dL (ref 70–99)
POTASSIUM: 4.3 meq/L (ref 3.5–5.1)
SODIUM: 143 meq/L (ref 135–145)
TOTAL PROTEIN: 6.6 g/dL (ref 6.0–8.3)
Total Bilirubin: 0.9 mg/dL (ref 0.2–1.2)

## 2017-04-28 LAB — LDL CHOLESTEROL, DIRECT: LDL DIRECT: 99 mg/dL

## 2017-04-28 LAB — URINALYSIS
BILIRUBIN URINE: NEGATIVE
HGB URINE DIPSTICK: NEGATIVE
KETONES UR: NEGATIVE
LEUKOCYTES UA: NEGATIVE
NITRITE: NEGATIVE
Specific Gravity, Urine: 1.015 (ref 1.000–1.030)
Total Protein, Urine: NEGATIVE
Urine Glucose: NEGATIVE
Urobilinogen, UA: 0.2 (ref 0.0–1.0)
pH: 7 (ref 5.0–8.0)

## 2017-04-28 NOTE — Progress Notes (Signed)
Subjective:  [name] is a 83 y.o. year old very pleasant female patient who presents for/with See problem oriented charting ROS- No chest pain or shortness of breath. No headache or blurry vision. Occasional very mild memory issues- not worsening.  intermittent right ear discomfort  Past Medical History-  Patient Active Problem List   Diagnosis Date Noted  . Atrial fibrillation ([place]) 03/29/2015    Priority: High  . History of intracerebral hemorrhage      Priority: High  . Essential hypertension 06/22/2006    Priority: Medium  . Dizzy spells 11/10/2014    Priority: Low  . SPINAL STENOSIS, CERVICAL 06/11/2010    Priority: Low  . Arthritis of right knee 06/22/2006    Priority: Low  . Osteopenia 06/22/2006    Priority: Low  . Urinary incontinence 04/28/2017  . Reactive depression 08/14/2015  . Intention tremor 06/21/2015  . Cerebral amyloid angiopathy ([place]) 05/29/2015    Medications- reviewed and updated Current Outpatient Prescriptions  Medication Sig Dispense Refill  . amLODipine (NORVASC) 5 MG tablet TAKE 1 TABLET BY MOUTH EVERY DAY 90 tablet 3  . losartan (COZAAR) 100 MG tablet TAKE 1 TABLET BY MOUTH DAILY 90 tablet 1  . Multiple Vitamin (MULTIVITAMIN) tablet Take 1 tablet by mouth daily.       No current facility-administered medications for this visit.     Objective: BP 136/82   Pulse 64   Temp 98.2 F (36.8 C) (Oral)   Ht 4\' 11"  (1.499 m)   Wt 133 lb (60.3 kg)   SpO2 95%   BMI 26.86 kg/m  Gen: NAD, resting comfortably TM normal bilaterally (intermittent right ear discomfort without clear cause) CV: RRR no murmurs rubs or gallops Lungs: CTAB no crackles, wheeze, rhonchi No suprapubic tenderness Ext: trace edema Skin: warm, dry  Assessment/Plan:  Essential hypertension S: controlled on losartan 100mg , amlodipine 5mg . Home #s averaging in range 114-128 over 70s or 80s BP Readings from Last 3 Encounters:  04/28/17 136/82  01/08/17 (!) 152/80   10/23/16 136/84  A/P: We discussed blood pressure goal of <140/90. Continue current meds: doing well. Home #s even better than in office and previously verified home cuff  Urinary incontinence S: incontinence of urine for 3-4 weeks. Started using pad at night. No dysuria. No issues in daytime. myrbetriq is expensive on their plan. Denies with coughing/sneezing  A/P: will get UA and culture to rule out infectious cause. Would avoid anticholinergic at her age and myrbetriq too costly- advised continue pads at night  6 months. Flu shot was given today  Orders Placed This Encounter  Procedures  . Urine Culture    [facility]  . CBC    [place]  . Comprehensive metabolic panel    [facility]  . LDL cholesterol, direct    [place]  . Urinalysis    Standing Status:   Future    Number of Occurrences:   1    Standing Expiration Date:   04/28/2018   Return precautions advised.  Garret Reddish, MD

## 2017-04-28 NOTE — Assessment & Plan Note (Signed)
S: incontinence of urine for 3-4 weeks. Started using pad at night. No dysuria. No issues in daytime. myrbetriq is expensive on their plan. Denies with coughing/sneezing  A/P: will get UA and culture to rule out infectious cause. Would avoid anticholinergic at her age and myrbetriq too costly- advised continue pads at night

## 2017-04-28 NOTE — Assessment & Plan Note (Signed)
S: controlled on losartan 100mg , amlodipine 5mg . Home #s averaging in range 114-128 over 70s or 80s BP Readings from Last 3 Encounters:  04/28/17 136/82  01/08/17 (!) 152/80  10/23/16 136/84  A/P: We discussed blood pressure goal of <140/90. Continue current meds: doing well. Home #s even better than in office and previously verified home cuff

## 2017-04-28 NOTE — Addendum Note (Signed)
Addended by: Wynn Banker H on: 04/28/2017 12:00 PM   Modules accepted: Orders

## 2017-04-28 NOTE — Patient Instructions (Addendum)
Please stop by lab before you go  Make sure no urinary tract infection  Thanks for getting your flu shot

## 2017-04-29 LAB — URINE CULTURE
MICRO NUMBER: 80972992
SPECIMEN QUALITY: ADEQUATE

## 2017-07-05 ENCOUNTER — Other Ambulatory Visit: Payer: Self-pay

## 2017-07-05 MED ORDER — LOSARTAN POTASSIUM 100 MG PO TABS: 100.0000 mg | ORAL_TABLET | Freq: Every day | ORAL | 1 refills | Status: DC

## 2017-08-23 ENCOUNTER — Ambulatory Visit: Payer: Self-pay

## 2017-08-23 NOTE — Telephone Encounter (Signed)
   Reason for Disposition . Cough has been present for > 3 weeks  Answer Assessment - Initial Assessment Questions 1. ONSET: "When did the cough begin?"      Started Friday 2. SEVERITY: "How bad is the cough today?"      Severe 3. RESPIRATORY DISTRESS: "Describe your breathing."      Shortness of breath with cough 4. FEVER: "Do you have a fever?" If so, ask: "What is your temperature, how was it measured, and when did it start?"     No 5. SPUTUM: "Describe the color of your sputum" (clear, white, yellow, green)     Green 6. HEMOPTYSIS: "Are you coughing up any blood?" If so ask: "How much?" (flecks, streaks, tablespoons, etc.)     No 7. CARDIAC HISTORY: "Do you have any history of heart disease?" (e.g., heart attack, congestive heart failure)      No 8. LUNG HISTORY: "Do you have any history of lung disease?"  (e.g., pulmonary embolus, asthma, emphysema)     No 9. PE RISK FACTORS: "Do you have a history of blood clots?" (or: recent major surgery, recent prolonged travel, bedridden )     No 10. OTHER SYMPTOMS: "Do you have any other symptoms?" (e.g., runny nose, wheezing, chest pain)       No 11. PREGNANCY: "Is there any chance you are pregnant?" "When was your last menstrual period?"       No 12. TRAVEL: "Have you traveled out of the country in the last month?" (e.g., travel history, exposures)       No  Protocols used: COUGH - ACUTE PRODUCTIVE-A-AH  Instructed to call Wednesday morning to see if she could be worked in to be seen. Will use home remedies.

## 2017-08-26 ENCOUNTER — Ambulatory Visit (INDEPENDENT_AMBULATORY_CARE_PROVIDER_SITE_OTHER): Payer: Medicare Other | Admitting: Family Medicine

## 2017-08-26 ENCOUNTER — Encounter: Payer: Self-pay | Admitting: Family Medicine

## 2017-08-26 VITALS — BP 114/60 | HR 73 | Temp 97.9°F | Ht 59.0 in | Wt 134.2 lb

## 2017-08-26 DIAGNOSIS — J209 Acute bronchitis, unspecified: Secondary | ICD-10-CM | POA: Diagnosis not present

## 2017-08-26 MED ORDER — DOXYCYCLINE HYCLATE 100 MG PO TABS: 100.0000 mg | ORAL_TABLET | Freq: Two times a day (BID) | ORAL | 0 refills | Status: AC

## 2017-08-26 MED ORDER — ALBUTEROL SULFATE HFA 108 (90 BASE) MCG/ACT IN AERS: 2.0000 | INHALATION_SPRAY | Freq: Four times a day (QID) | RESPIRATORY_TRACT | 0 refills | Status: DC | PRN

## 2017-08-26 NOTE — Progress Notes (Signed)
PCP: Marin Olp, MD  Subjective:  [name] is a 84 y.o. year old very pleasant female patient who presents with  symptoms including nasal congestion with yellow drainage, deep productive cough, chest congestion.  - does have wheeze as well -started: 9 days ago, symptoms are improving very slightly -previous treatments: mucinex helps -sick contacts/travel/risks: denies flu exposure.   ROS-denies fever, NVD, tooth pain. Denies significant shortness of breath.   Pertinent Past Medical History-  Patient Active Problem List   Diagnosis Date Noted  . Atrial fibrillation ([place]) 03/29/2015    Priority: High  . History of intracerebral hemorrhage      Priority: High  . Essential hypertension 06/22/2006    Priority: Medium  . Dizzy spells 11/10/2014    Priority: Low  . SPINAL STENOSIS, CERVICAL 06/11/2010    Priority: Low  . Arthritis of right knee 06/22/2006    Priority: Low  . Osteopenia 06/22/2006    Priority: Low  . Urinary incontinence 04/28/2017  . Reactive depression 08/14/2015  . Intention tremor 06/21/2015  . Cerebral amyloid angiopathy ([place]) 05/29/2015    Medications- reviewed  Current Outpatient Medications  Medication Sig Dispense Refill  . amLODipine (NORVASC) 5 MG tablet TAKE 1 TABLET BY MOUTH EVERY DAY 90 tablet 3  . losartan (COZAAR) 100 MG tablet Take 1 tablet (100 mg total) daily by mouth. 90 tablet 1  . Multiple Vitamin (MULTIVITAMIN) tablet Take 1 tablet by mouth daily.       No current facility-administered medications for this visit.     Objective: BP 114/60 (BP Location: Left Arm, Patient Position: Sitting, Cuff Size: Large)   Pulse 73   Temp 97.9 F (36.6 C) (Oral)   Ht 4\' 11"  (1.499 m)   Wt 134 lb 3.2 oz (60.9 kg)   SpO2 97%   BMI 27.11 kg/m  Gen: NAD, resting comfortably HEENT: Turbinates erythematous, TM normal, pharynx mildly erythematous with no tonsilar exudate or edema, no sinus tenderness CV: RRR no murmurs rubs or  gallops Lungs: Diffuse wheeze but no crackles or rhonchi Ext: no edema Skin: warm, dry, no rash  Assessment/Plan:  Bronchitis History and exam today are suggestive of viral infection most likely due to bronchitis (we discussed about 1/40 chance this is bacterial and that we would be more aggressive about potential bacterial causes given her age and co morbidities including a fib, history of intracerebral hemorrhage). Symptomatic treatment with: mucinex Luckily [name] is improving slightly- we opted to continue her mucinex twice a day as long as she is improving and can use albuterol for cough or wheeze. If she does not continue to improve or symptoms worsen- she is going to take the doxycycline which we sent in. She will see [place] back if she has chest pain, fever, or shortness of breath. Considered prednisone but held off for now.   Likely course of 2-6 weeks. Patient is contagious and advised good handwashing and consideration of mask If going to be in public places.   Meds ordered this encounter  Medications  . albuterol (PROVENTIL HFA;VENTOLIN HFA) 108 (90 Base) MCG/ACT inhaler    Sig: Inhale 2 puffs into the lungs every 6 (six) hours as needed for wheezing or shortness of breath.    Dispense:  1 Inhaler    Refill:  0  . doxycycline (VIBRA-TABS) 100 MG tablet    Sig: Take 1 tablet (100 mg total) by mouth 2 (two) times daily for 7 days.    Dispense:  14  tablet    Refill:  0    Garret Reddish, MD

## 2017-08-26 NOTE — Patient Instructions (Signed)
Bronchitis History and exam today are suggestive of viral infection most likely due to bronchitis (we discussed about 1/40 chance this is bacterial and that we would be more aggressive about potential bacterial causes given her age). Symptomatic treatment with: mucinex Luckily [name] is improving slightly- we opted to continue her mucinex twice a day as long as she is improving and can use albuterol for cough or wheeze. If she does not continue to improve or symptoms worsen- she is going to take the doxycycline which we sent in. She will see [place] back if she has chest pain, fever, or shortness of breath. Considered prednisone but held off for now.   Likely course of 2-6 weeks. Patient is contagious and advised good handwashing and consideration of mask If going to be in public places.   Meds ordered this encounter  Medications  . albuterol (PROVENTIL HFA;VENTOLIN HFA) 108 (90 Base) MCG/ACT inhaler    Sig: Inhale 2 puffs into the lungs every 6 (six) hours as needed for wheezing or shortness of breath.    Dispense:  1 Inhaler    Refill:  0  . doxycycline (VIBRA-TABS) 100 MG tablet    Sig: Take 1 tablet (100 mg total) by mouth 2 (two) times daily for 7 days.    Dispense:  14 tablet    Refill:  0

## 2017-09-14 ENCOUNTER — Telehealth: Payer: Self-pay | Admitting: Family Medicine

## 2017-09-14 NOTE — Telephone Encounter (Signed)
Copied from [place]. Topic: Inquiry >> Sep 14, 2017  4:48 PM [name] Pila B wrote: Reason for CRM: pt fell over the oxygen cord of her husband tank whom is a fellow pt of [facility], and daughter was recommended to call to see if Dr. Yong Channel could call in orders for physical therapy for the pt since [facility] is already coming to the house for her father, contact [name] if needed

## 2017-09-15 NOTE — Telephone Encounter (Signed)
Called and spoke with daughter [name] who states her mother did fall but that she didn't go all the way down as she was able to catch herself with the refrigerator. They are requesting PT to help with balance issues. OK to order?

## 2017-09-15 NOTE — Telephone Encounter (Signed)
I would love to do this for them. She will need a face to face visit with [place] for this visit for insurance to cover this though. Can you check with home health to see when I need to see patient within for them to cover the PT being ordered? im happy to provide verbal order if it will be covered for patient

## 2017-09-16 NOTE — Telephone Encounter (Signed)
Called and spoke with daughter [name] who stated they will schedule an appointment to discuss PT and that she will call the insurance company to ask about them covering PT.

## 2017-11-11 DIAGNOSIS — H43812 Vitreous degeneration, left eye: Secondary | ICD-10-CM | POA: Diagnosis not present

## 2017-11-11 DIAGNOSIS — Z961 Presence of intraocular lens: Secondary | ICD-10-CM | POA: Diagnosis not present

## 2017-11-11 DIAGNOSIS — H353132 Nonexudative age-related macular degeneration, bilateral, intermediate dry stage: Secondary | ICD-10-CM | POA: Diagnosis not present

## 2017-11-11 DIAGNOSIS — H35363 Drusen (degenerative) of macula, bilateral: Secondary | ICD-10-CM | POA: Diagnosis not present

## 2018-01-03 ENCOUNTER — Telehealth: Payer: Self-pay | Admitting: Family Medicine

## 2018-01-03 NOTE — Telephone Encounter (Signed)
See note

## 2018-01-03 NOTE — Telephone Encounter (Signed)
Copied from [place] [id number]. Topic: Quick Communication - Rx Refill/Question >> Jan 03, 2018 10:26 AM Synthia Innocent wrote: Medication: losartan (COZAAR) 100 MG tablet  Has the patient contacted their pharmacy? Yes.   (Agent: If no, request that the patient contact the pharmacy for the refill.) Preferred Pharmacy (with phone number or street name): [facility] [address] Agent: Please be advised that RX refills may take up to 3 business days. We ask that you follow-up with your pharmacy.

## 2018-01-04 ENCOUNTER — Other Ambulatory Visit: Payer: Self-pay

## 2018-01-04 ENCOUNTER — Telehealth: Payer: Self-pay

## 2018-01-04 MED ORDER — LOSARTAN POTASSIUM 100 MG PO TABS: 100.0000 mg | ORAL_TABLET | Freq: Every day | ORAL | 1 refills | Status: DC

## 2018-01-04 NOTE — Telephone Encounter (Signed)
Copied from [place] [id number]. Topic: Inquiry >> Jan 04, 2018 11:06 AM Pricilla Handler wrote: Reason for CRM: Patient called requesting to speak with Lucianne Lei regarding a question about her refill. Please call patient at [phone].       Thank You!!!

## 2018-01-04 NOTE — Telephone Encounter (Signed)
Rx sent to pharmacy   

## 2018-01-06 NOTE — Telephone Encounter (Signed)
Called patient and left her a voicemail message asking for a return phone call. I did let her know that we had sent her medication to the pharmacy on 01/04/18

## 2018-01-20 ENCOUNTER — Encounter: Payer: Self-pay | Admitting: Family Medicine

## 2018-01-20 ENCOUNTER — Ambulatory Visit (INDEPENDENT_AMBULATORY_CARE_PROVIDER_SITE_OTHER): Payer: Medicare Other | Admitting: Family Medicine

## 2018-01-20 ENCOUNTER — Encounter

## 2018-01-20 VITALS — BP 142/78 | HR 68 | Temp 98.1°F | Ht 59.0 in | Wt 131.6 lb

## 2018-01-20 DIAGNOSIS — I482 Chronic atrial fibrillation, unspecified: Secondary | ICD-10-CM

## 2018-01-20 DIAGNOSIS — R32 Unspecified urinary incontinence: Secondary | ICD-10-CM

## 2018-01-20 DIAGNOSIS — I10 Essential (primary) hypertension: Secondary | ICD-10-CM

## 2018-01-20 MED ORDER — AMLODIPINE BESYLATE 10 MG PO TABS: 10.0000 mg | ORAL_TABLET | Freq: Every day | ORAL | 3 refills | Status: DC

## 2018-01-20 NOTE — Assessment & Plan Note (Addendum)
S: No anticoagulation due to intracereberal hemorrhage history. Rate controlled without meds.  A/P: Continue without medication.  Luckily rate controlled and has not had a stroke.

## 2018-01-20 NOTE — Progress Notes (Signed)
Subjective:  [name] is a 84 y.o. year old very pleasant female patient who presents for/with See problem oriented charting ROS- No chest pain or shortness of breath. No headache or blurry vision.  Admits to stress  Past Medical History-  Patient Active Problem List   Diagnosis Date Noted  . Atrial fibrillation ([place]) 03/29/2015    Priority: High  . History of intracerebral hemorrhage      Priority: High  . Essential hypertension 06/22/2006    Priority: Medium  . Dizzy spells 11/10/2014    Priority: Low  . SPINAL STENOSIS, CERVICAL 06/11/2010    Priority: Low  . Arthritis of right knee 06/22/2006    Priority: Low  . Osteopenia 06/22/2006    Priority: Low  . Urinary incontinence 04/28/2017  . Reactive depression 08/14/2015  . Intention tremor 06/21/2015  . Cerebral amyloid angiopathy ([place]) 05/29/2015    Medications- reviewed and updated Current Outpatient Medications  Medication Sig Dispense Refill  . albuterol (PROVENTIL HFA;VENTOLIN HFA) 108 (90 Base) MCG/ACT inhaler Inhale 2 puffs into the lungs every 6 (six) hours as needed for wheezing or shortness of breath. 1 Inhaler 0  . amLODipine (NORVASC) 10 MG tablet Take 1 tablet (10 mg total) by mouth daily. 90 tablet 3  . losartan (COZAAR) 100 MG tablet Take 1 tablet (100 mg total) by mouth daily. 90 tablet 1  . Multiple Vitamin (MULTIVITAMIN) tablet Take 1 tablet by mouth daily.       No current facility-administered medications for this visit.     Objective: BP (!) 142/78 (BP Location: Left Arm, Patient Position: Sitting, Cuff Size: Large)   Pulse 68   Temp 98.1 F (36.7 C) (Oral)   Ht 4\' 11"  (1.499 m)   Wt 131 lb 9.6 oz (59.7 kg)   SpO2 97%   BMI 26.58 kg/m  Gen: NAD, resting comfortably CV: irregularly irregular, no murmurs rubs or gallops Lungs: CTAB no crackles, wheeze, rhonchi Abdomen: soft/nontender/nondistended/normal bowel sounds. No rebound or guarding.  Ext: 1+ edema  Skin: warm, dry, no  rash Seems stressed when talking about recent losses and stress with husband  Assessment/Plan:  Other notes: 1.has lost 7 loved ones/family members/friends in last year- really hard on her 2. Stress of caring for husband- but loves him to pieces and now married 42 years  3.  Incontinence only at night. Not worsening. Using pads.   Essential hypertension S: poorly controlled on  amlodipine 5mg  and losartan 100mg   Home #s from 130s to 150s. Has seen several #s into 140s, occasionally 150s. Never into 160s.  Seems to be averaging in the 140s BP Readings from Last 3 Encounters:  01/20/18 (!) 142/78  08/26/17 114/60  04/28/17 136/82  A/P: We discussed blood pressure goal of <140/90. Continue current meds: But increase amlodipine to 10 mg with 2 to 4-week follow-up.  I am concerned about edema with increase in amlodipine but I do not think there are other great options-  Avoiding beta-blockers due to history of bradycardia, diuretics I suspect would lead to orthostasis  Atrial fibrillation (HCC) S: No anticoagulation due to intracereberal hemorrhage history. Rate controlled without meds.  A/P: Continue without medication.  Luckily rate controlled and has not had a stroke.  Urinary incontinence She remains frustrated by her incontinence.  She uses pads at night.  No recent worsening.  No daytime issues.  We have evaluated for infectious causes in the past and work-up was negative.  Avoiding anticholinergics with age.  Myrbetriq was  too costly in the past   Future Appointments  Date Time Provider [place]  02/04/2018  2:15 PM Marin Olp, MD [facility]   Meds ordered this encounter  Medications  . amLODipine (NORVASC) 10 MG tablet    Sig: Take 1 tablet (10 mg total) by mouth daily.    Dispense:  90 tablet    Refill:  3    Return precautions advised.  Garret Reddish, MD

## 2018-01-20 NOTE — Patient Instructions (Signed)
Continue losartan 100mg  Increase amlodipine to 10mg  since you are getting home blood pressures into 140s and 150s- hopeful we can keep you in 130s or 140s.   See me back in 2-4 weeks.

## 2018-01-20 NOTE — Assessment & Plan Note (Addendum)
S: poorly controlled on  amlodipine 5mg  and losartan 100mg   Home #s from 130s to 150s. Has seen several #s into 140s, occasionally 150s. Never into 160s.  Seems to be averaging in the 140s BP Readings from Last 3 Encounters:  01/20/18 (!) 142/78  08/26/17 114/60  04/28/17 136/82  A/P: We discussed blood pressure goal of <140/90. Continue current meds: But increase amlodipine to 10 mg with 2 to 4-week follow-up.  I am concerned about edema with increase in amlodipine but I do not think there are other great options-  Avoiding beta-blockers due to history of bradycardia, diuretics I suspect would lead to orthostasis

## 2018-01-20 NOTE — Assessment & Plan Note (Addendum)
She remains frustrated by her incontinence.  She uses pads at night.  No recent worsening.  No daytime issues.  We have evaluated for infectious causes in the past and work-up was negative.  Avoiding anticholinergics with age.  Myrbetriq was too costly in the past

## 2018-02-04 ENCOUNTER — Ambulatory Visit (INDEPENDENT_AMBULATORY_CARE_PROVIDER_SITE_OTHER): Payer: Medicare Other | Admitting: Family Medicine

## 2018-02-04 ENCOUNTER — Encounter: Payer: Self-pay | Admitting: *Deleted

## 2018-02-04 ENCOUNTER — Encounter: Payer: Self-pay | Admitting: Family Medicine

## 2018-02-04 ENCOUNTER — Ambulatory Visit (INDEPENDENT_AMBULATORY_CARE_PROVIDER_SITE_OTHER): Payer: Medicare Other | Admitting: *Deleted

## 2018-02-04 VITALS — BP 126/82 | HR 73 | Ht 59.0 in | Wt 133.0 lb

## 2018-02-04 DIAGNOSIS — Z0000 Encounter for general adult medical examination without abnormal findings: Secondary | ICD-10-CM

## 2018-02-04 DIAGNOSIS — I10 Essential (primary) hypertension: Secondary | ICD-10-CM | POA: Diagnosis not present

## 2018-02-04 DIAGNOSIS — M85851 Other specified disorders of bone density and structure, right thigh: Secondary | ICD-10-CM | POA: Diagnosis not present

## 2018-02-04 NOTE — Progress Notes (Signed)
Subjective:   [name] is a 84 y.o. female who presents for Medicare Annual (Subsequent) preventive examination.  Reports health as Loss of family and friends Lost caregiver Dad Sister in law lives in [place] - died as well 2 dear friends expired since she came down here  One was 65's and their handy man   Moved to dtr's home x 3 years  Dtr checks on her frequently and she lives downstairs in the basement  Has caregivers   Diet BMI is 70  States one of her favorites cooking and baking After the stroke she can't cook Got some better; now doing some cooking Gershon Mussel is there now 74;   Exercise Still gets around the house Tries to [employer] to keep busy   Family has stopped her from driving   Health Maintenance Due  Topic Date Due  . DEXA SCAN  10/07/1984   Waive dexa at this time        Objective:     Vitals: BP 126/82   Pulse 73   Ht 4\' 11"  (1.499 m)   Wt 133 lb (60.3 kg)   BMI 26.86 kg/m   Body mass index is 26.86 kg/m.  Advanced Directives 01/21/2016 10/14/2015 08/14/2015 06/21/2015 03/29/2015 03/19/2015 07/22/2014  Does Patient Have a Medical Advance Directive? Yes Yes Yes Yes Yes Yes Yes  Type of Advance Directive - Living will - - [place];Living will [place];Living will -  Does patient want to make changes to medical advance directive? - - - - No - Patient declined No - Patient declined -  Copy of [place] in Chart? - - - - No - copy requested No - copy requested -    Tobacco Social History   Tobacco Use  Smoking Status Never Smoker  Smokeless Tobacco Never Used     Counseling given: Yes   Clinical Intake:   Past Medical History:  Diagnosis Date  . Hypertension   . Osteopenia   . Stroke [facility])    Past Surgical History:  Procedure Laterality Date  . CATARACT EXTRACTION     Family History  Problem Relation Age of Onset  . Cancer Daughter        breast   Social History    Socioeconomic History  . Marital status: Married    Spouse name: Not on file  . Number of children: Not on file  . Years of education: Not on file  . Highest education level: Not on file  Occupational History  . Not on file  Social Needs  . Financial resource strain: Not on file  . Food insecurity:    Worry: Not on file    Inability: Not on file  . Transportation needs:    Medical: Not on file    Non-medical: Not on file  Tobacco Use  . Smoking status: Never Smoker  . Smokeless tobacco: Never Used  Substance and Sexual Activity  . Alcohol use: Yes    Alcohol/week: 0.6 oz    Types: 1 Glasses of wine per week    Comment: 1 glass with dinner  . Drug use: No  . Sexual activity: Not on file  Lifestyle  . Physical activity:    Days per week: Not on file    Minutes per session: Not on file  . Stress: Not on file  Relationships  . Social connections:    Talks on phone: Not on file    Gets  together: Not on file    Attends religious service: Not on file    Active member of club or organization: Not on file    Attends meetings of clubs or organizations: Not on file    Relationship status: Not on file  Other Topics Concern  . Not on file  Social History Narrative   Married (husband Tom age 43 in Secretary practice) 63. 2 [place] oldest daughter to breast cancer. 4 grandkids.       Husband and patient live with daughter.       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       Hobbies: reading, had been quilting, enjoys cooking/baking.       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    Full Code.           Outpatient Encounter Medications as of 02/04/2018  Medication Sig  . albuterol (PROVENTIL HFA;VENTOLIN HFA) 108 (90 Base) MCG/ACT inhaler Inhale 2 puffs into the lungs every 6 (six) hours as needed for wheezing or shortness of breath.  Marland Kitchen amLODipine (NORVASC) 10 MG tablet Take 1 tablet (10 mg total) by mouth daily.  Marland Kitchen losartan (COZAAR) 100 MG  tablet Take 1 tablet (100 mg total) by mouth daily.  . Multiple Vitamin (MULTIVITAMIN) tablet Take 1 tablet by mouth daily.     No facility-administered encounter medications on file as of 02/04/2018.     Activities of Daily Living No flowsheet data found.  Patient Care Team: Marin Olp, MD as PCP - General (Family Medicine)    Assessment:   This is a routine wellness examination for [name].  Exercise Activities and Dietary recommendations    Goals    None      Fall Risk Fall Risk  08/26/2017 06/23/2016 10/14/2015 09/05/2015 08/14/2015  Falls in the past year? No No No No No  Number falls in past yr: - - - - -  Injury with Fall? - - - - -  Comment - - - - -  Risk Factor Category  - - - - -  Follow up - - - - -     Depression Screen PHQ 2/9 Scores 08/26/2017 01/21/2016 05/29/2015 01/04/2015  PHQ - 2 Score 1 0 0 0    Having grief due to loss of friends Lost dtr to 51yo with Breast cancer; she had 2 children; dtr is 7 now and son is 32 now.   Cognitive Function     Ad8 score reviewed for issues:  Issues making decisions:  Less interest in hobbies / activities:  Repeats questions, stories (family complaining):  Trouble using ordinary gadgets (microwave, computer, phone):  Forgets the month or year:   Mismanaging finances:   Remembering appts:  Daily problems with thinking and/or memory: Ad8 score is=0        Immunization History  Administered Date(s) Administered  . Influenza Split 05/27/2011, 05/18/2012  . Influenza Whole 06/22/2007, 05/25/2008, 06/04/2009, 05/05/2010  . Influenza, High Dose Seasonal PF 05/22/2016, 04/28/2017  . Influenza,inj,Quad PF,6+ Mos 05/17/2013, 06/21/2014, 06/06/2015  . Pneumococcal Conjugate-13 07/23/2014  . Pneumococcal Polysaccharide-23 06/22/2007  . Td 11/12/2014  . Zoster 05/25/2008      Screening Tests Health Maintenance  Topic Date Due  . DEXA SCAN  10/07/1984  . INFLUENZA VACCINE  03/24/2018  .  TETANUS/TDAP  11/11/2024  . PNA vac Low Risk Adult  Completed       Plan:      PCP Notes   Health Maintenance Will have  eye rechecked due to some minor vision distortion  Will have a hearing screen  Mentioned the shingirix vaccine. Can discuss with Dr. Yong Channel if she is wants to consider taking this   Abnormal Screens  Hearing as noted   Referrals  none  Patient concerns; Discussed energy consultation and having caregiver x 2 days a week Will cook or do other issues when caregiver is there.  Also grieving over recent losses which brings back the death of her dtr at 82 yo to breast cancer. Uses a walker at home.  Asked the caregiver to help her walk as much as possible.  Nurse Concerns; As noted  Next PCP apt Was seen today       I have personally reviewed and noted the following in the patient's chart:   . Medical and social history . Use of alcohol, tobacco or illicit drugs  . Current medications and supplements . Functional ability and status . Nutritional status . Physical activity . Advanced directives . List of other physicians . Hospitalizations, surgeries, and ER visits in previous 12 months . Vitals . Screenings to include cognitive, depression, and falls . Referrals and appointments  In addition, I have reviewed and discussed with patient certain preventive protocols, quality metrics, and best practice recommendations. A written personalized care plan for preventive services as well as general preventive health recommendations were provided to patient.     Wynetta Fines, RN  02/04/2018

## 2018-02-04 NOTE — Progress Notes (Signed)
Subjective:  [name] is a 84 y.o. year old very pleasant female patient who presents for/with See problem oriented charting ROS- No chest pain or shortness of breath. No headache or blurry vision.   Past Medical History-  Patient Active Problem List   Diagnosis Date Noted  . Atrial fibrillation ([place]) 03/29/2015    Priority: High  . History of intracerebral hemorrhage      Priority: High  . Essential hypertension 06/22/2006    Priority: Medium  . Dizzy spells 11/10/2014    Priority: Low  . SPINAL STENOSIS, CERVICAL 06/11/2010    Priority: Low  . Arthritis of right knee 06/22/2006    Priority: Low  . Osteopenia 06/22/2006    Priority: Low  . Urinary incontinence 04/28/2017  . Reactive depression 08/14/2015  . Intention tremor 06/21/2015  . Cerebral amyloid angiopathy ([place]) 05/29/2015    Medications- reviewed and updated Current Outpatient Medications  Medication Sig Dispense Refill  . albuterol (PROVENTIL HFA;VENTOLIN HFA) 108 (90 Base) MCG/ACT inhaler Inhale 2 puffs into the lungs every 6 (six) hours as needed for wheezing or shortness of breath. 1 Inhaler 0  . amLODipine (NORVASC) 10 MG tablet Take 1 tablet (10 mg total) by mouth daily. 90 tablet 3  . losartan (COZAAR) 100 MG tablet Take 1 tablet (100 mg total) by mouth daily. 90 tablet 1  . Multiple Vitamin (MULTIVITAMIN) tablet Take 1 tablet by mouth daily.       No current facility-administered medications for this visit.     Objective: BP 126/82 (BP Location: Left Arm, Patient Position: Sitting, Cuff Size: Normal)   Pulse 73   Temp 97.9 F (36.6 C) (Oral)   Ht 4\' 11"  (1.499 m)   Wt 133 lb 3.2 oz (60.4 kg)   SpO2 97%   BMI 26.90 kg/m  Gen: NAD, resting comfortably CV: RRR no murmurs rubs or gallops Lungs: CTAB no crackles, wheeze, rhonchi Abdomen: soft/nontender/nondistended/normal bowel sounds. No rebound or guarding.  Ext: 1+ edema Skin: warm, dry Neuro: hard of hearing  Assessment/Plan:  Other  notes: 1. lost 2 more good friends- once again this has been a really challenging time for patient. Did my best to encourage her today- she truly is an incredible woman  Essential hypertension S: controlled on amlodipine 10mg  and losartan 100mg .   Home readings average averaging 834 systolic. All diastolics under 90 and averaging around 70.  BP Readings from Last 3 Encounters:  02/04/18 126/82  01/20/18 (!) 142/78  08/26/17 114/60  A/P: We discussed blood pressure goal of <140/90. Continue current meds:  With good control in office and at home  Osteopenia Osteopenia 2010 located after visit with right femoral neck at -2.2 as worst score. We will plan to set up dexa next visit   Return in about 3 months (around 05/07/2018).  Return precautions advised.  Garret Reddish, MD

## 2018-02-04 NOTE — Assessment & Plan Note (Signed)
S: controlled on amlodipine 10mg  and losartan 100mg .   Home readings average averaging 924 systolic. All diastolics under 90 and averaging around 70.  BP Readings from Last 3 Encounters:  02/04/18 126/82  01/20/18 (!) 142/78  08/26/17 114/60  A/P: We discussed blood pressure goal of <140/90. Continue current meds:  With good control in office and at home

## 2018-02-04 NOTE — Patient Instructions (Addendum)
Blood pressure looks great today! Continue current medicines  Check in 3 months for recheck- or sooner if you need [place]  We would be happy to see your husband soon since he is having a hard time

## 2018-02-04 NOTE — Patient Instructions (Addendum)
[name] , Thank you for taking time to come for your Medicare Wellness Visit. I appreciate your ongoing commitment to your health goals. Please review the following plan we discussed and let me know if I can assist you in the future.   Will make an apt with A[facility] with Dr. Jabier Mutton  Will have a hearing screen anywhere you would like to go  You may talk to Dr. Yong Channel about taking the shingrix  Shingrix is a vaccine for the prevention of Shingles in Adults 50 and older.  If you are on Medicare, the shingrix is covered under your Part D plan, so you will take both of the vaccines in the series at your pharmacy. Please check with your benefits regarding applicable copays or out of pocket expenses.  The Shingrix is given in 2 vaccines approx 8 weeks apart. You must receive the 2nd dose prior to 6 months from receipt of the first. Please have the pharmacist print out you Immunization  dates for our office records   These are the goals we discussed: Goals    . patient      To continue to exercise and move and stay engaged. Keep coming for your appointments       This is a list of the screening recommended for you and due dates:  Health Maintenance  Topic Date Due  . Flu Shot  03/24/2018  . Tetanus Vaccine  11/11/2024  . DEXA scan (bone density measurement)  Completed  . Pneumonia vaccines  Completed    Health Maintenance for Postmenopausal Women Menopause is a normal process in which your reproductive ability comes to an end. This process happens gradually over a span of months to years, usually between the ages of 28 and 70. Menopause is complete when you have missed 12 consecutive menstrual periods. It is important to talk with your health care provider about some of the most common conditions that affect postmenopausal women, such as heart disease, cancer, and bone loss (osteoporosis). Adopting a healthy lifestyle and getting preventive care can help to promote your health and wellness.  Those actions can also lower your chances of developing some of these common conditions. What should I know about menopause? During menopause, you may experience a number of symptoms, such as: Moderate-to-severe hot flashes. Night sweats. Decrease in sex drive. Mood swings. Headaches. Tiredness. Irritability. Memory problems. Insomnia.  Choosing to treat or not to treat menopausal changes is an individual decision that you make with your health care provider. What should I know about hormone replacement therapy and supplements? Hormone therapy products are effective for treating symptoms that are associated with menopause, such as hot flashes and night sweats. Hormone replacement carries certain risks, especially as you become older. If you are thinking about using estrogen or estrogen with progestin treatments, discuss the benefits and risks with your health care provider. What should I know about heart disease and stroke? Heart disease, heart attack, and stroke become more likely as you age. This may be due, in part, to the hormonal changes that your body experiences during menopause. These can affect how your body processes dietary fats, triglycerides, and cholesterol. Heart attack and stroke are both medical emergencies. There are many things that you can do to help prevent heart disease and stroke: Have your blood pressure checked at least every 1-2 years. High blood pressure causes heart disease and increases the risk of stroke. If you are 27-66 years old, ask your health care provider if you should  take aspirin to prevent a heart attack or a stroke. Do not use any tobacco products, including cigarettes, chewing tobacco, or electronic cigarettes. If you need help quitting, ask your health care provider. It is important to eat a healthy diet and maintain a healthy weight. Be sure to include plenty of vegetables, fruits, low-fat dairy products, and lean protein. Avoid eating foods that  are high in solid fats, added sugars, or salt (sodium). Get regular exercise. This is one of the most important things that you can do for your health. Try to exercise for at least 150 minutes each week. The type of exercise that you do should increase your heart rate and make you sweat. This is known as moderate-intensity exercise. Try to do strengthening exercises at least twice each week. Do these in addition to the moderate-intensity exercise. Know your numbers.Ask your health care provider to check your cholesterol and your blood glucose. Continue to have your blood tested as directed by your health care provider.  What should I know about cancer screening? There are several types of cancer. Take the following steps to reduce your risk and to catch any cancer development as early as possible. Breast Cancer Practice breast self-awareness. This means understanding how your breasts normally appear and feel. It also means doing regular breast self-exams. Let your health care provider know about any changes, no matter how small. If you are 24 or older, have a clinician do a breast exam (clinical breast exam or CBE) every year. Depending on your age, family history, and medical history, it may be recommended that you also have a yearly breast X-ray (mammogram). If you have a family history of breast cancer, talk with your health care provider about genetic screening. If you are at high risk for breast cancer, talk with your health care provider about having an MRI and a mammogram every year. Breast cancer (BRCA) gene test is recommended for women who have family members with BRCA-related cancers. Results of the assessment will determine the need for genetic counseling and BRCA1 and for BRCA2 testing. BRCA-related cancers include these types: Breast. This occurs in males or females. Ovarian. Tubal. This may also be called fallopian tube cancer. Cancer of the abdominal or pelvic lining (peritoneal  cancer). Prostate. Pancreatic.  Cervical, Uterine, and Ovarian Cancer Your health care provider may recommend that you be screened regularly for cancer of the pelvic organs. These include your ovaries, uterus, and vagina. This screening involves a pelvic exam, which includes checking for microscopic changes to the surface of your cervix (Pap test). For women ages 21-65, health care providers may recommend a pelvic exam and a Pap test every three years. For women ages 20-65, they may recommend the Pap test and pelvic exam, combined with testing for human papilloma virus (HPV), every five years. Some types of HPV increase your risk of cervical cancer. Testing for HPV may also be done on women of any age who have unclear Pap test results. Other health care providers may not recommend any screening for nonpregnant women who are considered low risk for pelvic cancer and have no symptoms. Ask your health care provider if a screening pelvic exam is right for you. If you have had past treatment for cervical cancer or a condition that could lead to cancer, you need Pap tests and screening for cancer for at least 20 years after your treatment. If Pap tests have been discontinued for you, your risk factors (such as having a new sexual partner) need  to be reassessed to determine if you should start having screenings again. Some women have medical problems that increase the chance of getting cervical cancer. In these cases, your health care provider may recommend that you have screening and Pap tests more often. If you have a family history of uterine cancer or ovarian cancer, talk with your health care provider about genetic screening. If you have vaginal bleeding after reaching menopause, tell your health care provider. There are currently no reliable tests available to screen for ovarian cancer.  Lung Cancer Lung cancer screening is recommended for adults 43-35 years old who are at high risk for lung cancer  because of a history of smoking. A yearly low-dose CT scan of the lungs is recommended if you: Currently smoke. Have a history of at least 30 pack-years of smoking and you currently smoke or have quit within the past 15 years. A pack-year is smoking an average of one pack of cigarettes per day for one year.  Yearly screening should: Continue until it has been 15 years since you quit. Stop if you develop a health problem that would prevent you from having lung cancer treatment.  Colorectal Cancer This type of cancer can be detected and can often be prevented. Routine colorectal cancer screening usually begins at age 93 and continues through age 46. If you have risk factors for colon cancer, your health care provider may recommend that you be screened at an earlier age. If you have a family history of colorectal cancer, talk with your health care provider about genetic screening. Your health care provider may also recommend using home test kits to check for hidden blood in your stool. A small camera at the end of a tube can be used to examine your colon directly (sigmoidoscopy or colonoscopy). This is done to check for the earliest forms of colorectal cancer. Direct examination of the colon should be repeated every 5-10 years until age 44. However, if early forms of precancerous polyps or small growths are found or if you have a family history or genetic risk for colorectal cancer, you may need to be screened more often.  Skin Cancer Check your skin from head to toe regularly. Monitor any moles. Be sure to tell your health care provider: About any new moles or changes in moles, especially if there is a change in a mole's shape or color. If you have a mole that is larger than the size of a pencil eraser. If any of your family members has a history of skin cancer, especially at a young age, talk with your health care provider about genetic screening. Always use sunscreen. Apply sunscreen liberally  and repeatedly throughout the day. Whenever you are outside, protect yourself by wearing long sleeves, pants, a wide-brimmed hat, and sunglasses.  What should I know about osteoporosis? Osteoporosis is a condition in which bone destruction happens more quickly than new bone creation. After menopause, you may be at an increased risk for osteoporosis. To help prevent osteoporosis or the bone fractures that can happen because of osteoporosis, the following is recommended: If you are 66-84 years old, get at least 1,000 mg of calcium and at least 600 mg of vitamin D per day. If you are older than age 22 but younger than age 2, get at least 1,200 mg of calcium and at least 600 mg of vitamin D per day. If you are older than age 98, get at least 1,200 mg of calcium and at least 800 mg of  vitamin D per day.  Smoking and excessive alcohol intake increase the risk of osteoporosis. Eat foods that are rich in calcium and vitamin D, and do weight-bearing exercises several times each week as directed by your health care provider. What should I know about how menopause affects my mental health? Depression may occur at any age, but it is more common as you become older. Common symptoms of depression include: Low or sad mood. Changes in sleep patterns. Changes in appetite or eating patterns. Feeling an overall lack of motivation or enjoyment of activities that you previously enjoyed. Frequent crying spells.  Talk with your health care provider if you think that you are experiencing depression. What should I know about immunizations? It is important that you get and maintain your immunizations. These include: Tetanus, diphtheria, and pertussis (Tdap) booster vaccine. Influenza every year before the flu season begins. Pneumonia vaccine. Shingles vaccine.  Your health care provider may also recommend other immunizations. This information is not intended to replace advice given to you by your health care  provider. Make sure you discuss any questions you have with your health care provider. Document Released: 10/02/2005 Document Revised: 02/28/2016 Document Reviewed: 05/14/2015 Elsevier Interactive Patient Education  2018 [place] in the Home Falls can cause injuries and can affect people from all age groups. There are many simple things that you can do to make your home safe and to help prevent falls. What can I do on the outside of my home?  Regularly repair the edges of walkways and driveways and fix any cracks.  Remove high doorway thresholds.  Trim any shrubbery on the main path into your home.  Use bright outdoor lighting.  Clear walkways of debris and clutter, including tools and rocks.  Regularly check that handrails are securely fastened and in good repair. Both sides of any steps should have handrails.  Install guardrails along the edges of any raised decks or porches.  Have leaves, snow, and ice cleared regularly.  Use sand or salt on walkways during winter months.  In the garage, clean up any spills right away, including grease or oil spills. What can I do in the bathroom?  Use night lights.  Install grab bars by the toilet and in the tub and shower. Do not use towel bars as grab bars.  Use non-skid mats or decals on the floor of the tub or shower.  If you need to sit down while you are in the shower, use a plastic, non-slip stool.  Keep the floor dry. Immediately clean up any water that spills on the floor.  Remove soap buildup in the tub or shower on a regular basis.  Attach bath mats securely with double-sided non-slip rug tape.  Remove throw rugs and other tripping hazards from the floor. What can I do in the bedroom?  Use night lights.  Make sure that a bedside light is easy to reach.  Do not use oversized bedding that drapes onto the floor.  Have a firm chair that has side arms to use for getting dressed.  Remove throw rugs  and other tripping hazards from the floor. What can I do in the kitchen?  Clean up any spills right away.  Avoid walking on wet floors.  Place frequently used items in easy-to-reach places.  If you need to reach for something above you, use a sturdy step stool that has a grab bar.  Keep electrical cables out of the way.  Do not use  floor polish or wax that makes floors slippery. If you have to use wax, make sure that it is non-skid floor wax.  Remove throw rugs and other tripping hazards from the floor. What can I do in the stairways?  Do not leave any items on the stairs.  Make sure that there are handrails on both sides of the stairs. Fix handrails that are broken or loose. Make sure that handrails are as long as the stairways.  Check any carpeting to make sure that it is firmly attached to the stairs. Fix any carpet that is loose or worn.  Avoid having throw rugs at the top or bottom of stairways, or secure the rugs with carpet tape to prevent them from moving.  Make sure that you have a light switch at the top of the stairs and the bottom of the stairs. If you do not have them, have them installed. What are some other fall prevention tips?  Wear closed-toe shoes that fit well and support your feet. Wear shoes that have rubber soles or low heels.  When you use a stepladder, make sure that it is completely opened and that the sides are firmly locked. Have someone hold the ladder while you are using it. Do not climb a closed stepladder.  Add color or contrast paint or tape to grab bars and handrails in your home. Place contrasting color strips on the first and last steps.  Use mobility aids as needed, such as canes, walkers, scooters, and crutches.  Turn on lights if it is dark. Replace any light bulbs that burn out.  Set up furniture so that there are clear paths. Keep the furniture in the same spot.  Fix any uneven floor surfaces.  Choose a carpet design that does not  hide the edge of steps of a stairway.  Be aware of any and all pets.  Review your medicines with your healthcare provider. Some medicines can cause dizziness or changes in blood pressure, which increase your risk of falling. Talk with your health care provider about other ways that you can decrease your risk of falls. This may include working with a physical therapist or trainer to improve your strength, balance, and endurance. This information is not intended to replace advice given to you by your health care provider. Make sure you discuss any questions you have with your health care provider. Document Released: 07/31/2002 Document Revised: 01/07/2016 Document Reviewed: 09/14/2014 Elsevier Interactive Patient Education  [employer].

## 2018-02-05 NOTE — Assessment & Plan Note (Signed)
Osteopenia 2010 located after visit with right femoral neck at -2.2 as worst score. We will plan to set up dexa next visit

## 2018-02-05 NOTE — Progress Notes (Signed)
I have reviewed and agree with note, evaluation, plan.  See my separate note from today  Stephen Hunter, MD  

## 2018-02-14 ENCOUNTER — Telehealth: Payer: Self-pay | Admitting: Family Medicine

## 2018-02-14 NOTE — Telephone Encounter (Signed)
See note

## 2018-02-14 NOTE — Telephone Encounter (Unsigned)
Copied from [place] [id number]. Topic: Quick Communication - See Telephone Encounter >> Feb 14, 2018 11:36 AM Percell Belt A wrote: CRM for notification. See Telephone encounter for: 02/14/18. Amber caregiver -[phone]  Pt wake up with swelling in one of his ankle.  It has gone down now but would like to know if Dr Yong Channel thinks that she needs to be seen.  No pain today and only a very tiny bit swollen today

## 2018-02-15 NOTE — Telephone Encounter (Signed)
Is this about patient or her husband? It says he in one part  If swelling has gone back down they can monitor at home and follow up with me if recurs

## 2018-02-17 NOTE — Telephone Encounter (Signed)
Called daughter [name] and left a voicemail message asking for a return phone call to clarify

## 2018-02-25 ENCOUNTER — Encounter: Payer: Self-pay | Admitting: Family Medicine

## 2018-02-25 ENCOUNTER — Ambulatory Visit: Payer: Self-pay | Admitting: *Deleted

## 2018-02-25 ENCOUNTER — Ambulatory Visit (INDEPENDENT_AMBULATORY_CARE_PROVIDER_SITE_OTHER): Payer: Medicare Other | Admitting: Family Medicine

## 2018-02-25 VITALS — BP 122/80 | HR 60 | Temp 98.2°F | Ht 59.0 in | Wt 130.6 lb

## 2018-02-25 DIAGNOSIS — R600 Localized edema: Secondary | ICD-10-CM

## 2018-02-25 LAB — COMPREHENSIVE METABOLIC PANEL
ALT: 15 U/L (ref 0–35)
AST: 20 U/L (ref 0–37)
Albumin: 4.6 g/dL (ref 3.5–5.2)
Alkaline Phosphatase: 55 U/L (ref 39–117)
BILIRUBIN TOTAL: 1 mg/dL (ref 0.2–1.2)
BUN: 22 mg/dL (ref 6–23)
CHLORIDE: 104 meq/L (ref 96–112)
CO2: 28 meq/L (ref 19–32)
CREATININE: 0.88 mg/dL (ref 0.40–1.20)
Calcium: 9.6 mg/dL (ref 8.4–10.5)
GFR: 63.01 mL/min (ref >60.00)
GLUCOSE: 98 mg/dL (ref 70–99)
Potassium: 5 mEq/L (ref 3.5–5.1)
SODIUM: 141 meq/L (ref 135–145)
Total Protein: 7.4 g/dL (ref 6.0–8.3)

## 2018-02-25 LAB — CBC WITH DIFFERENTIAL/PLATELET
BASOS ABS: 0 10*3/uL (ref 0.0–0.1)
BASOS PCT: 0.5 % (ref 0.0–3.0)
EOS ABS: 0.1 10*3/uL (ref 0.0–0.7)
Eosinophils Relative: 0.7 % (ref 0.0–5.0)
HCT: 42.1 % (ref 36.0–46.0)
Hemoglobin: 14.4 g/dL (ref 12.0–15.0)
Lymphocytes Relative: 17 % (ref 12.0–46.0)
Lymphs Abs: 1.4 10*3/uL (ref 0.7–4.0)
MCHC: 34.2 g/dL (ref 30.0–36.0)
MCV: 89.8 fl (ref 78.0–100.0)
MONO ABS: 0.9 10*3/uL (ref 0.1–1.0)
Monocytes Relative: 11 % (ref 3.0–12.0)
NEUTROS ABS: 5.8 10*3/uL (ref 1.4–7.7)
NEUTROS PCT: 70.8 % (ref 43.0–77.0)
PLATELETS: 179 10*3/uL (ref 150.0–400.0)
RBC: 4.7 Mil/uL (ref 3.87–5.11)
RDW: 14.4 % (ref 11.5–15.5)
WBC: 8.2 10*3/uL (ref 4.0–10.5)

## 2018-02-25 LAB — BRAIN NATRIURETIC PEPTIDE: Pro B Natriuretic peptide (BNP): 235 pg/mL — ABNORMAL HIGH (ref 0.0–100.0)

## 2018-02-25 LAB — TSH: TSH: 4.79 u[IU]/mL — ABNORMAL HIGH (ref 0.35–4.50)

## 2018-02-25 NOTE — Telephone Encounter (Signed)
Pt calling with complaints of bilateral leg swelling since last Monday. Pt states that she has been elevating her legs to decrease the swelling but noticed that on yesterday the swelling increased and today it is mostly in her right leg. Pt thinks that the swelling may be due to taking Amlodipine. Pt states she does have redness in her legs and currently rates pain at 5. Pt states she has never experienced swelling in her legs prior to last week. Pt denies any shortness of breath, chest pain or dizziness at this time. PCP not available for appt today. Appt scheduled with Dr. Rogers Blocker today at 1:15pm. Pt advised to call the office back if symptoms become worse or other symptoms develop. Pt verbalized understanding.  Reason for Disposition . [1] MODERATE leg swelling (e.g., swelling extends up to knees) AND [2] new onset or worsening  Answer Assessment - Initial Assessment Questions 1. ONSET: "When did the swelling start?" (e.g., minutes, hours, days)     Last Monday 2. LOCATION: "What part of the leg is swollen?"  "Are both legs swollen or just one leg?"     Both legs up to the knee 3. SEVERITY: "How bad is the swelling?" (e.g., localized; mild, moderate, severe)  - Localized - small area of swelling localized to one leg  - MILD pedal edema - swelling limited to foot and ankle, pitting edema < 1/4 inch (6 mm) deep, rest and elevation eliminate most or all swelling  - MODERATE edema - swelling of lower leg to knee, pitting edema > 1/4 inch (6 mm) deep, rest and elevation only partially reduce swelling  - SEVERE edema - swelling extends above knee, facial or hand swelling present      Moderate with pitting 4. REDNESS: "Does the swelling look red or infected?"     Both legs are red 5. PAIN: "Is the swelling painful to touch?" If so, ask: "How painful is it?"   (Scale 1-10; mild, moderate or severe)     About a 5 6. FEVER: "Do you have a fever?" If so, ask: "What is it, how was it measured, and when did  it start?"      no 7. CAUSE: "What do you think is causing the leg swelling?"     Possibly starting on amlodipine 8. MEDICAL HISTORY: "Do you have a history of heart failure, kidney disease, liver failure, or cancer?"     No 9. RECURRENT SYMPTOM: "Have you had leg swelling before?" If so, ask: "When was the last time?" "What happened that time?"     No 10. OTHER SYMPTOMS: "Do you have any other symptoms?" (e.g., chest pain, difficulty breathing)       no 11. PREGNANCY: "Is there any chance you are pregnant?" "When was your last menstrual period?"       no  Protocols used: LEG SWELLING AND EDEMA-A-AH

## 2018-02-25 NOTE — Patient Instructions (Signed)
STOP amlodipine/NORVASC. Keep your blood pressure log and come back in 2 weeks. I think the medication is causing your swelling and goal BP is 140/80-150/80.

## 2018-02-25 NOTE — Telephone Encounter (Signed)
Patient seen by Dr. Rogers Blocker in office today 7/5 @ 1:15 pm for B/L leg swelling

## 2018-02-25 NOTE — Progress Notes (Signed)
Patient: [name] MRN: [id number] DOB: [birth year] PCP: Marin Olp, MD     Subjective:  Chief Complaint  Patient presents with  . Foot Swelling    R > L (swelling in leg also)    HPI: The patient is a 84 y.o. female who presents today for bilateral leg swelling about 2 weeks ago. Her right leg is worse than the left. She was put on amlodipine at the end of may and is on 10mg . She and caregiver state she has not really had any swelling in her legs. She didn't take her amlodipine last night. She has no other change in medication. She has not been doing as much cooking on her own so may have had more sodium in her diet. No orthopnea, cough or difficulty breathing. Seems to have been more acute and associated with medication. No swelling or redness of pain in calves.  Review of Systems  Constitutional: Positive for fatigue.  Respiratory: Negative for shortness of breath.   Cardiovascular: Positive for leg swelling. Negative for chest pain.  Gastrointestinal: Negative for nausea and vomiting.  Musculoskeletal: Negative for back pain and neck pain.  Neurological: Positive for light-headedness. Negative for headaches.  Psychiatric/Behavioral: The patient is nervous/anxious.     Allergies Patient is allergic to ace inhibitors.  Past Medical History Patient  has a past medical history of Hypertension, Osteopenia, and Stroke ([place]).  Surgical History Patient  has a past surgical history that includes Cataract extraction.  Family History Pateint's family history includes Cancer in her daughter.  Social History Patient  reports that she has never smoked. She has never used smokeless tobacco. She reports that she drinks about 0.6 oz of alcohol per week. She reports that she does not use drugs.    Objective: Vitals:   02/25/18 1330  BP: 122/80  Pulse: 60  Temp: 98.2 F (36.8 C)  TempSrc: Oral  SpO2: 98%  Weight: 130 lb 9.6 oz (59.2 kg)  Height: 4\' 11"  (1.499 m)     Body mass index is 26.38 kg/m.  Physical Exam  Constitutional: She appears well-developed and well-nourished.  Neck: Normal range of motion. Neck supple. No JVD present.  Cardiovascular: Normal rate, regular rhythm, normal heart sounds and intact distal pulses.  Pedal pulses 2+, can not palpate PT due to edema  2-3+ pitting edema in upper foot/ankle and edema on anterior leg to just below knee, non pitting.   Pulmonary/Chest: Effort normal and breath sounds normal.  Abdominal: Soft. Bowel sounds are normal.  Skin:  Stasis dermatitis bilateral lower legs.   Psychiatric: She has a normal mood and affect. Her behavior is normal.  Vitals reviewed.      Assessment/plan: 1. Lower leg edema Likely secondary to norvasc. She is running quite low on her blood pressure and seems to run per home readings.  At her age would want her around 140/80-150/80. Stopping her novasc, checking labs and conservative therapy with swelling. (compression hose/leg elevation/walking). Discussed her discoloration and rash is early stasis dermatitis likely from drug. Will have her f/u in 2 weeks with pcp and keep BP log. They are to call if BP >150/80. Other differential would be venous insufficiency and procardia may be better option.  - CBC with Differential/Platelet - Comprehensive metabolic panel - TSH - Brain natriuretic peptide    Return in about 2 weeks (around 03/11/2018) for bp/swelling check .   Orma Flaming, MD [place]   02/25/2018

## 2018-03-11 ENCOUNTER — Encounter: Payer: Self-pay | Admitting: Family Medicine

## 2018-03-11 ENCOUNTER — Ambulatory Visit (INDEPENDENT_AMBULATORY_CARE_PROVIDER_SITE_OTHER): Payer: Medicare Other | Admitting: Family Medicine

## 2018-03-11 VITALS — BP 132/80 | HR 70 | Temp 98.3°F | Ht 59.0 in | Wt 128.4 lb

## 2018-03-11 DIAGNOSIS — I10 Essential (primary) hypertension: Secondary | ICD-10-CM | POA: Diagnosis not present

## 2018-03-11 DIAGNOSIS — F4321 Adjustment disorder with depressed mood: Secondary | ICD-10-CM | POA: Diagnosis not present

## 2018-03-11 NOTE — Progress Notes (Signed)
Subjective:  [name] is a 84 y.o. year old very pleasant female patient who presents for/with See problem oriented charting ROS- No chest pain or shortness of breath. No headache or blurry vision.  Some sad mood given loss of her dog recently   Past Medical History-  Patient Active Problem List   Diagnosis Date Noted  . Atrial fibrillation ([place]) 03/29/2015    Priority: High  . History of intracerebral hemorrhage      Priority: High  . Essential hypertension 06/22/2006    Priority: Medium  . Dizzy spells 11/10/2014    Priority: Low  . SPINAL STENOSIS, CERVICAL 06/11/2010    Priority: Low  . Arthritis of right knee 06/22/2006    Priority: Low  . Osteopenia 06/22/2006    Priority: Low  . Urinary incontinence 04/28/2017  . Reactive depression 08/14/2015  . Intention tremor 06/21/2015  . Cerebral amyloid angiopathy ([place]) 05/29/2015    Medications- reviewed and updated Current Outpatient Medications  Medication Sig Dispense Refill  . losartan (COZAAR) 100 MG tablet Take 1 tablet (100 mg total) by mouth daily. 90 tablet 1  . Multiple Vitamin (MULTIVITAMIN) tablet Take 1 tablet by mouth daily.       No current facility-administered medications for this visit.     Objective: BP 132/80 (BP Location: Left Arm, Patient Position: Sitting, Cuff Size: Normal)   Pulse 70   Temp 98.3 F (36.8 C) (Oral)   Ht 4\' 11"  (1.499 m)   Wt 128 lb 6.4 oz (58.2 kg)   SpO2 97%   BMI 25.93 kg/m  Gen: NAD, resting comfortably CV: RRR no murmurs rubs or gallops Lungs: CTAB no crackles, wheeze, rhonchi Abdomen: soft/nontender/nondistended/normal bowel sounds.  Ext: trace edema Skin: warm, dry  Assessment/Plan:  Grieving S: lost her 42 year old dog to pancreatitis- affecting whole family A/P: counseling provided  Essential hypertension S: last visit with me, BP controlled on  amlodipne 10mg  and losartan 100mg  last visit with home #s running 133/70.   Patient later developed edema,  then saw Dr. Rogers Blocker who stopped norvasc due to lower extremity edema. Continued on losartan 100mg . Pro BNP was under 250 so low risk heart failure. Hair high tsh. Cbc, cmp largely normal. She mentioned possibly using procardia as add on.   HOme #s averaging 134/70 on 11 readings with HR from 52-66 with one outlier of 76 during errands BP Readings from Last 3 Encounters:  03/11/18 132/80  02/25/18 122/80  02/04/18 126/82  A/P: We discussed blood pressure goal of <140/90- I prefer this given history intracerebral hemorrhage but I certainly understand Dr. Shelby Mattocks preference 140-150 given age. Continue current meds:  Losartan 100mg  alone. I would hesitate on procardia given home HRs into 50s frequently. Perhaps lower dose amlodipine if needed in future. Home monitoring only for now  Edema was triggered by high salt diet and we discussed watching that  Lets plan on checking in 3-6 months from now or sooner if any issues such as blood pressure going up  Time Stamp The duration of face-to-face time during this visit was greater than 15 minutes. Greater than 50% of this time was spent in counseling, explanation of diagnosis, planning of further management, and/or coordination of care including counseling around loss of dog, BP goals, return precautions.    Return precautions advised.  Garret Reddish, MD

## 2018-03-11 NOTE — Assessment & Plan Note (Addendum)
S: last visit with me, BP controlled on  amlodipne 10mg  and losartan 100mg  last visit with home #s running 133/70.   Patient later developed edema, then saw Dr. Rogers Blocker who stopped norvasc due to lower extremity edema. Continued on losartan 100mg . Pro BNP was under 250 so low risk heart failure. Hair high tsh. Cbc, cmp largely normal. She mentioned possibly using procardia as add on.   HOme #s averaging 134/70 on 11 readings with HR from 52-66 with one outlier of 76 during errands BP Readings from Last 3 Encounters:  03/11/18 132/80  02/25/18 122/80  02/04/18 126/82  A/P: We discussed blood pressure goal of <140/90- I prefer this given history intracerebral hemorrhage but I certainly understand Dr. Shelby Mattocks preference 140-150 given age. Continue current meds:  Losartan 100mg  alone. I would hesitate on procardia given home HRs into 50s frequently. Perhaps lower dose amlodipine if needed in future. Home monitoring only for now  Edema was triggered by high salt diet and we discussed watching that

## 2018-03-11 NOTE — Patient Instructions (Signed)
As long as blood pressure remains <140/90 on average- continue losartan alone. We may consider a low dose add on medicine if needed in the future  So sorry for this tough stretch lately and the loss of your dog in the last week  Lets plan on checking in 3-6 months from now or sooner if any issues such as blood pressure going up

## 2018-03-13 DIAGNOSIS — M4802 Spinal stenosis, cervical region: Secondary | ICD-10-CM | POA: Diagnosis not present

## 2018-03-13 DIAGNOSIS — R000 Tachycardia, unspecified: Secondary | ICD-10-CM | POA: Diagnosis not present

## 2018-03-13 DIAGNOSIS — I69398 Other sequelae of cerebral infarction: Secondary | ICD-10-CM | POA: Diagnosis not present

## 2018-03-13 DIAGNOSIS — I10 Essential (primary) hypertension: Secondary | ICD-10-CM | POA: Diagnosis not present

## 2018-03-13 DIAGNOSIS — I6389 Other cerebral infarction: Secondary | ICD-10-CM | POA: Diagnosis not present

## 2018-03-13 DIAGNOSIS — Z8679 Personal history of other diseases of the circulatory system: Secondary | ICD-10-CM | POA: Diagnosis not present

## 2018-03-13 DIAGNOSIS — I639 Cerebral infarction, unspecified: Secondary | ICD-10-CM | POA: Diagnosis not present

## 2018-03-13 DIAGNOSIS — R531 Weakness: Secondary | ICD-10-CM | POA: Diagnosis not present

## 2018-03-13 DIAGNOSIS — Z79899 Other long term (current) drug therapy: Secondary | ICD-10-CM | POA: Diagnosis not present

## 2018-03-13 DIAGNOSIS — I674 Hypertensive encephalopathy: Secondary | ICD-10-CM | POA: Diagnosis not present

## 2018-03-13 DIAGNOSIS — M1990 Unspecified osteoarthritis, unspecified site: Secondary | ICD-10-CM | POA: Diagnosis not present

## 2018-03-13 DIAGNOSIS — R4020 Unspecified coma: Secondary | ICD-10-CM | POA: Diagnosis not present

## 2018-03-13 DIAGNOSIS — G9389 Other specified disorders of brain: Secondary | ICD-10-CM | POA: Diagnosis not present

## 2018-03-13 DIAGNOSIS — R471 Dysarthria and anarthria: Secondary | ICD-10-CM | POA: Diagnosis not present

## 2018-03-13 DIAGNOSIS — I672 Cerebral atherosclerosis: Secondary | ICD-10-CM | POA: Diagnosis not present

## 2018-03-13 DIAGNOSIS — D320 Benign neoplasm of cerebral meninges: Secondary | ICD-10-CM | POA: Diagnosis not present

## 2018-03-13 DIAGNOSIS — I480 Paroxysmal atrial fibrillation: Secondary | ICD-10-CM | POA: Diagnosis not present

## 2018-03-13 DIAGNOSIS — I080 Rheumatic disorders of both mitral and aortic valves: Secondary | ICD-10-CM | POA: Diagnosis not present

## 2018-03-13 DIAGNOSIS — R4789 Other speech disturbances: Secondary | ICD-10-CM | POA: Diagnosis not present

## 2018-03-13 DIAGNOSIS — R410 Disorientation, unspecified: Secondary | ICD-10-CM | POA: Diagnosis not present

## 2018-03-13 DIAGNOSIS — R29818 Other symptoms and signs involving the nervous system: Secondary | ICD-10-CM | POA: Diagnosis not present

## 2018-03-13 DIAGNOSIS — R451 Restlessness and agitation: Secondary | ICD-10-CM | POA: Diagnosis not present

## 2018-03-13 DIAGNOSIS — R4701 Aphasia: Secondary | ICD-10-CM | POA: Diagnosis not present

## 2018-03-13 DIAGNOSIS — I63511 Cerebral infarction due to unspecified occlusion or stenosis of right middle cerebral artery: Secondary | ICD-10-CM | POA: Diagnosis not present

## 2018-03-13 DIAGNOSIS — I517 Cardiomegaly: Secondary | ICD-10-CM | POA: Diagnosis not present

## 2018-03-13 DIAGNOSIS — I499 Cardiac arrhythmia, unspecified: Secondary | ICD-10-CM | POA: Diagnosis not present

## 2018-03-15 ENCOUNTER — Encounter: Payer: Self-pay | Admitting: Family Medicine

## 2018-03-15 ENCOUNTER — Telehealth: Payer: Self-pay | Admitting: *Deleted

## 2018-03-15 DIAGNOSIS — E782 Mixed hyperlipidemia: Secondary | ICD-10-CM | POA: Insufficient documentation

## 2018-03-15 DIAGNOSIS — E785 Hyperlipidemia, unspecified: Secondary | ICD-10-CM | POA: Insufficient documentation

## 2018-03-15 NOTE — Telephone Encounter (Signed)
Discussed there are risks/benefits of coming off ASA. She did have a stroke and being treated at OSH and neurology could see prior hemorrhage so since neurology is aware and still recommending asa- she should move forward with that

## 2018-03-15 NOTE — Telephone Encounter (Signed)
Copied from [place] [phone]. Topic: General - Other >> Mar 15, 2018 12:07 PM Judyann Munson wrote: Reason for CRM:  Patient daughter (ms.[email or web address]) is calling to speak with Dr.hunter in regards to the patient taking baby Asprin  and stated last time she had a brain bleed and she is wanting to consult with Dr.hunter in regards to making a decision on her medication.  Her best contact number is [phone]. Please advise

## 2018-03-15 NOTE — Telephone Encounter (Signed)
Dr. Hunter, please see message and advise. 

## 2018-03-16 NOTE — Telephone Encounter (Signed)
Patient's daughter called and states that [facility] is wanting to give her Lovenox. She told them to hold off until she spoke with Dr Yong Channel or his nurse. Please call her at @ [phone]

## 2018-03-16 NOTE — Telephone Encounter (Signed)
Spoke with daughter. She got an appointment with Dr. Leonie Man on august 29th. Daughter wants to keep mom off of asa and lovenox due to prior hemorrhagic stroke- she is going to go ask hospitalist/neurologist about this option. We discussed how complicated this case is due to prior hemorrhagic stroke and now with ischemic vs. Embolic stroke from a fib. There are risks/benefts to each option. Daughter watns to focus on BP control, lowering stress and getting her back to her [place] neurologist- reasonable approach there

## 2018-03-16 NOTE — Telephone Encounter (Signed)
See note

## 2018-03-16 NOTE — Telephone Encounter (Signed)
Dr. Yong Channel, please see note and advise

## 2018-03-19 DIAGNOSIS — M6281 Muscle weakness (generalized): Secondary | ICD-10-CM | POA: Diagnosis not present

## 2018-03-19 DIAGNOSIS — M1990 Unspecified osteoarthritis, unspecified site: Secondary | ICD-10-CM | POA: Diagnosis not present

## 2018-03-19 DIAGNOSIS — I619 Nontraumatic intracerebral hemorrhage, unspecified: Secondary | ICD-10-CM | POA: Diagnosis not present

## 2018-03-19 DIAGNOSIS — I674 Hypertensive encephalopathy: Secondary | ICD-10-CM | POA: Diagnosis not present

## 2018-03-19 DIAGNOSIS — I10 Essential (primary) hypertension: Secondary | ICD-10-CM | POA: Diagnosis not present

## 2018-03-19 DIAGNOSIS — I69320 Aphasia following cerebral infarction: Secondary | ICD-10-CM | POA: Diagnosis not present

## 2018-03-19 DIAGNOSIS — M150 Primary generalized (osteo)arthritis: Secondary | ICD-10-CM | POA: Diagnosis not present

## 2018-03-19 DIAGNOSIS — R262 Difficulty in walking, not elsewhere classified: Secondary | ICD-10-CM | POA: Diagnosis not present

## 2018-03-19 DIAGNOSIS — I679 Cerebrovascular disease, unspecified: Secondary | ICD-10-CM | POA: Diagnosis not present

## 2018-03-19 MED ORDER — ATORVASTATIN CALCIUM 40 MG PO TABS
40.00 | ORAL_TABLET | ORAL | Status: DC
Start: 2018-03-17 — End: 2018-03-19

## 2018-03-19 MED ORDER — LOSARTAN POTASSIUM 50 MG PO TABS
100.00 | ORAL_TABLET | ORAL | Status: DC
Start: 2018-03-18 — End: 2018-03-19

## 2018-03-19 MED ORDER — GENERIC EXTERNAL MEDICATION
Status: DC
Start: ? — End: 2018-03-19

## 2018-03-19 MED ORDER — SODIUM CHLORIDE 0.9 % IV SOLN
10.00 | INTRAVENOUS | Status: DC
Start: ? — End: 2018-03-19

## 2018-03-19 MED ORDER — HALOPERIDOL LACTATE 5 MG/ML IJ SOLN
2.00 | INTRAMUSCULAR | Status: DC
Start: ? — End: 2018-03-19

## 2018-03-19 MED ORDER — NITROGLYCERIN 0.4 MG SL SUBL
0.40 | SUBLINGUAL_TABLET | SUBLINGUAL | Status: DC
Start: ? — End: 2018-03-19

## 2018-03-19 MED ORDER — HYDRALAZINE HCL 20 MG/ML IJ SOLN
10.00 | INTRAMUSCULAR | Status: DC
Start: ? — End: 2018-03-19

## 2018-03-19 MED ORDER — LABETALOL HCL 5 MG/ML IV SOLN
10.00 | INTRAVENOUS | Status: DC
Start: ? — End: 2018-03-19

## 2018-03-19 MED ORDER — HYDRALAZINE HCL 25 MG PO TABS
50.00 | ORAL_TABLET | ORAL | Status: DC
Start: 2018-03-17 — End: 2018-03-19

## 2018-03-19 MED ORDER — ACETAMINOPHEN 325 MG PO TABS
650.00 | ORAL_TABLET | ORAL | Status: DC
Start: ? — End: 2018-03-19

## 2018-03-19 MED ORDER — THERA PO TABS
1.00 | ORAL_TABLET | ORAL | Status: DC
Start: 2018-03-18 — End: 2018-03-19

## 2018-03-21 ENCOUNTER — Telehealth: Payer: Self-pay | Admitting: Family Medicine

## 2018-03-21 DIAGNOSIS — R262 Difficulty in walking, not elsewhere classified: Secondary | ICD-10-CM | POA: Diagnosis not present

## 2018-03-21 DIAGNOSIS — I679 Cerebrovascular disease, unspecified: Secondary | ICD-10-CM | POA: Diagnosis not present

## 2018-03-21 DIAGNOSIS — I619 Nontraumatic intracerebral hemorrhage, unspecified: Secondary | ICD-10-CM | POA: Diagnosis not present

## 2018-03-21 DIAGNOSIS — I69320 Aphasia following cerebral infarction: Secondary | ICD-10-CM | POA: Diagnosis not present

## 2018-03-21 DIAGNOSIS — M1990 Unspecified osteoarthritis, unspecified site: Secondary | ICD-10-CM | POA: Diagnosis not present

## 2018-03-21 DIAGNOSIS — I10 Essential (primary) hypertension: Secondary | ICD-10-CM | POA: Diagnosis not present

## 2018-03-21 DIAGNOSIS — M150 Primary generalized (osteo)arthritis: Secondary | ICD-10-CM | POA: Diagnosis not present

## 2018-03-21 DIAGNOSIS — I674 Hypertensive encephalopathy: Secondary | ICD-10-CM | POA: Diagnosis not present

## 2018-03-21 DIAGNOSIS — M6281 Muscle weakness (generalized): Secondary | ICD-10-CM | POA: Diagnosis not present

## 2018-03-21 NOTE — Telephone Encounter (Signed)
See note

## 2018-03-21 NOTE — Telephone Encounter (Signed)
Copied from [place] [phone]. Topic: Quick Communication - See Telephone Encounter >> Mar 21, 2018  4:08 PM Nils Flack wrote: CRM for notification. See Telephone encounter for: 03/21/18. Angie with [facility] called - requesting verbal orders Nursing  1 week 1  2 week 2 Starting 03/19/18 Home health aid  3 week 2  Cb is [phone] Ok to leave message

## 2018-03-21 NOTE — Telephone Encounter (Signed)
Called Angie and gave verbal orders for patient.

## 2018-03-22 DIAGNOSIS — I619 Nontraumatic intracerebral hemorrhage, unspecified: Secondary | ICD-10-CM | POA: Diagnosis not present

## 2018-03-22 DIAGNOSIS — I69320 Aphasia following cerebral infarction: Secondary | ICD-10-CM | POA: Diagnosis not present

## 2018-03-22 DIAGNOSIS — M150 Primary generalized (osteo)arthritis: Secondary | ICD-10-CM | POA: Diagnosis not present

## 2018-03-22 DIAGNOSIS — M1990 Unspecified osteoarthritis, unspecified site: Secondary | ICD-10-CM | POA: Diagnosis not present

## 2018-03-22 DIAGNOSIS — M6281 Muscle weakness (generalized): Secondary | ICD-10-CM | POA: Diagnosis not present

## 2018-03-22 DIAGNOSIS — R262 Difficulty in walking, not elsewhere classified: Secondary | ICD-10-CM | POA: Diagnosis not present

## 2018-03-22 DIAGNOSIS — I10 Essential (primary) hypertension: Secondary | ICD-10-CM | POA: Diagnosis not present

## 2018-03-22 DIAGNOSIS — I674 Hypertensive encephalopathy: Secondary | ICD-10-CM | POA: Diagnosis not present

## 2018-03-22 DIAGNOSIS — I679 Cerebrovascular disease, unspecified: Secondary | ICD-10-CM | POA: Diagnosis not present

## 2018-03-23 ENCOUNTER — Telehealth: Payer: Self-pay | Admitting: Family Medicine

## 2018-03-23 DIAGNOSIS — I10 Essential (primary) hypertension: Secondary | ICD-10-CM | POA: Diagnosis not present

## 2018-03-23 DIAGNOSIS — I69320 Aphasia following cerebral infarction: Secondary | ICD-10-CM | POA: Diagnosis not present

## 2018-03-23 DIAGNOSIS — I674 Hypertensive encephalopathy: Secondary | ICD-10-CM | POA: Diagnosis not present

## 2018-03-23 DIAGNOSIS — M6281 Muscle weakness (generalized): Secondary | ICD-10-CM | POA: Diagnosis not present

## 2018-03-23 DIAGNOSIS — I679 Cerebrovascular disease, unspecified: Secondary | ICD-10-CM | POA: Diagnosis not present

## 2018-03-23 DIAGNOSIS — M1990 Unspecified osteoarthritis, unspecified site: Secondary | ICD-10-CM | POA: Diagnosis not present

## 2018-03-23 DIAGNOSIS — M150 Primary generalized (osteo)arthritis: Secondary | ICD-10-CM | POA: Diagnosis not present

## 2018-03-23 DIAGNOSIS — R262 Difficulty in walking, not elsewhere classified: Secondary | ICD-10-CM | POA: Diagnosis not present

## 2018-03-23 DIAGNOSIS — I619 Nontraumatic intracerebral hemorrhage, unspecified: Secondary | ICD-10-CM | POA: Diagnosis not present

## 2018-03-23 NOTE — Telephone Encounter (Signed)
Copied from [place] [phone]. Topic: Quick Communication - Rx Refill/Question >> Mar 23, 2018  4:24 PM [name] Pila B wrote: Medication: lipitor  Pt's daughter called to inform pcp that the home health staff gave pt an extra dose of the medication above before scheduled time; contact pt if needed

## 2018-03-23 NOTE — Telephone Encounter (Signed)
Copied from [place] [phone]. Topic: Inquiry >> Mar 23, 2018  3:31 PM Scherrie Gerlach wrote: Reason for CRM: Reason for CRM: Liji with [facility] health calling with verbal orders fro home health PT 2 wk 4 : (starting Friday 8/02 with a call back by tomorrow) 1 wk 2

## 2018-03-24 ENCOUNTER — Inpatient Hospital Stay: Payer: Medicare Other | Admitting: Family Medicine

## 2018-03-24 DIAGNOSIS — I69398 Other sequelae of cerebral infarction: Secondary | ICD-10-CM | POA: Diagnosis not present

## 2018-03-24 DIAGNOSIS — M1990 Unspecified osteoarthritis, unspecified site: Secondary | ICD-10-CM | POA: Diagnosis not present

## 2018-03-24 DIAGNOSIS — M4802 Spinal stenosis, cervical region: Secondary | ICD-10-CM | POA: Diagnosis not present

## 2018-03-24 DIAGNOSIS — I69320 Aphasia following cerebral infarction: Secondary | ICD-10-CM | POA: Diagnosis not present

## 2018-03-24 DIAGNOSIS — M6281 Muscle weakness (generalized): Secondary | ICD-10-CM | POA: Diagnosis not present

## 2018-03-24 DIAGNOSIS — I480 Paroxysmal atrial fibrillation: Secondary | ICD-10-CM | POA: Diagnosis not present

## 2018-03-24 DIAGNOSIS — I10 Essential (primary) hypertension: Secondary | ICD-10-CM | POA: Diagnosis not present

## 2018-03-24 NOTE — Telephone Encounter (Signed)
Called and left a voicemail message providing verbal order as requested. I provided a call back number for questions

## 2018-03-25 DIAGNOSIS — M1990 Unspecified osteoarthritis, unspecified site: Secondary | ICD-10-CM | POA: Diagnosis not present

## 2018-03-25 DIAGNOSIS — I10 Essential (primary) hypertension: Secondary | ICD-10-CM | POA: Diagnosis not present

## 2018-03-25 DIAGNOSIS — I69398 Other sequelae of cerebral infarction: Secondary | ICD-10-CM | POA: Diagnosis not present

## 2018-03-25 DIAGNOSIS — I480 Paroxysmal atrial fibrillation: Secondary | ICD-10-CM | POA: Diagnosis not present

## 2018-03-25 DIAGNOSIS — I69320 Aphasia following cerebral infarction: Secondary | ICD-10-CM | POA: Diagnosis not present

## 2018-03-25 DIAGNOSIS — M6281 Muscle weakness (generalized): Secondary | ICD-10-CM | POA: Diagnosis not present

## 2018-03-25 DIAGNOSIS — M4802 Spinal stenosis, cervical region: Secondary | ICD-10-CM | POA: Diagnosis not present

## 2018-03-26 NOTE — Telephone Encounter (Signed)
No extra follow up needed. Return to prior schedule.

## 2018-03-28 DIAGNOSIS — I480 Paroxysmal atrial fibrillation: Secondary | ICD-10-CM | POA: Diagnosis not present

## 2018-03-28 DIAGNOSIS — M4802 Spinal stenosis, cervical region: Secondary | ICD-10-CM | POA: Diagnosis not present

## 2018-03-28 DIAGNOSIS — I69398 Other sequelae of cerebral infarction: Secondary | ICD-10-CM | POA: Diagnosis not present

## 2018-03-28 DIAGNOSIS — I69320 Aphasia following cerebral infarction: Secondary | ICD-10-CM | POA: Diagnosis not present

## 2018-03-28 DIAGNOSIS — I10 Essential (primary) hypertension: Secondary | ICD-10-CM | POA: Diagnosis not present

## 2018-03-28 DIAGNOSIS — M6281 Muscle weakness (generalized): Secondary | ICD-10-CM | POA: Diagnosis not present

## 2018-03-28 DIAGNOSIS — M1990 Unspecified osteoarthritis, unspecified site: Secondary | ICD-10-CM | POA: Diagnosis not present

## 2018-03-29 DIAGNOSIS — I10 Essential (primary) hypertension: Secondary | ICD-10-CM | POA: Diagnosis not present

## 2018-03-29 DIAGNOSIS — I69398 Other sequelae of cerebral infarction: Secondary | ICD-10-CM | POA: Diagnosis not present

## 2018-03-29 DIAGNOSIS — M1990 Unspecified osteoarthritis, unspecified site: Secondary | ICD-10-CM | POA: Diagnosis not present

## 2018-03-29 DIAGNOSIS — I480 Paroxysmal atrial fibrillation: Secondary | ICD-10-CM | POA: Diagnosis not present

## 2018-03-29 DIAGNOSIS — M6281 Muscle weakness (generalized): Secondary | ICD-10-CM | POA: Diagnosis not present

## 2018-03-29 DIAGNOSIS — I69320 Aphasia following cerebral infarction: Secondary | ICD-10-CM | POA: Diagnosis not present

## 2018-03-29 DIAGNOSIS — M4802 Spinal stenosis, cervical region: Secondary | ICD-10-CM | POA: Diagnosis not present

## 2018-03-30 DIAGNOSIS — I69398 Other sequelae of cerebral infarction: Secondary | ICD-10-CM | POA: Diagnosis not present

## 2018-03-30 DIAGNOSIS — M1990 Unspecified osteoarthritis, unspecified site: Secondary | ICD-10-CM | POA: Diagnosis not present

## 2018-03-30 DIAGNOSIS — I69320 Aphasia following cerebral infarction: Secondary | ICD-10-CM | POA: Diagnosis not present

## 2018-03-30 DIAGNOSIS — I480 Paroxysmal atrial fibrillation: Secondary | ICD-10-CM | POA: Diagnosis not present

## 2018-03-30 DIAGNOSIS — I10 Essential (primary) hypertension: Secondary | ICD-10-CM | POA: Diagnosis not present

## 2018-03-30 DIAGNOSIS — M4802 Spinal stenosis, cervical region: Secondary | ICD-10-CM | POA: Diagnosis not present

## 2018-03-30 DIAGNOSIS — M6281 Muscle weakness (generalized): Secondary | ICD-10-CM | POA: Diagnosis not present

## 2018-03-31 ENCOUNTER — Encounter: Payer: Self-pay | Admitting: Family Medicine

## 2018-03-31 ENCOUNTER — Ambulatory Visit (INDEPENDENT_AMBULATORY_CARE_PROVIDER_SITE_OTHER): Payer: Medicare Other | Admitting: Family Medicine

## 2018-03-31 VITALS — BP 138/86 | HR 62 | Temp 97.9°F | Ht 59.0 in | Wt 132.0 lb

## 2018-03-31 DIAGNOSIS — Z8679 Personal history of other diseases of the circulatory system: Secondary | ICD-10-CM

## 2018-03-31 DIAGNOSIS — E785 Hyperlipidemia, unspecified: Secondary | ICD-10-CM

## 2018-03-31 DIAGNOSIS — I69320 Aphasia following cerebral infarction: Secondary | ICD-10-CM | POA: Diagnosis not present

## 2018-03-31 DIAGNOSIS — I10 Essential (primary) hypertension: Secondary | ICD-10-CM | POA: Diagnosis not present

## 2018-03-31 DIAGNOSIS — D329 Benign neoplasm of meninges, unspecified: Secondary | ICD-10-CM

## 2018-03-31 DIAGNOSIS — I69398 Other sequelae of cerebral infarction: Secondary | ICD-10-CM | POA: Diagnosis not present

## 2018-03-31 DIAGNOSIS — M4802 Spinal stenosis, cervical region: Secondary | ICD-10-CM | POA: Diagnosis not present

## 2018-03-31 DIAGNOSIS — M6281 Muscle weakness (generalized): Secondary | ICD-10-CM | POA: Diagnosis not present

## 2018-03-31 DIAGNOSIS — Z8673 Personal history of transient ischemic attack (TIA), and cerebral infarction without residual deficits: Secondary | ICD-10-CM

## 2018-03-31 DIAGNOSIS — I480 Paroxysmal atrial fibrillation: Secondary | ICD-10-CM | POA: Diagnosis not present

## 2018-03-31 DIAGNOSIS — M1990 Unspecified osteoarthritis, unspecified site: Secondary | ICD-10-CM | POA: Diagnosis not present

## 2018-03-31 MED ORDER — AMLODIPINE BESYLATE 5 MG PO TABS: 5.0000 mg | ORAL_TABLET | Freq: Every day | ORAL | 3 refills | Status: DC

## 2018-03-31 NOTE — Assessment & Plan Note (Signed)
S: hospitalist Started statin in 02/2018 after stroke Lab Results  Component Value Date   CHOL 132 03/25/2015   HDL 38 (L) 03/25/2015   LDLCALC 75 03/25/2015   LDLDIRECT 99.0 04/28/2017   TRIG 95 03/25/2015   CHOLHDL 3.5 03/25/2015   A/P: continue atorvastatin 40 mg- hoping LDL under 700 on next check- would want to be at least 6 weeks out from starting before checking labs

## 2018-03-31 NOTE — Assessment & Plan Note (Signed)
S: 02/2018 Stroke happened after stressful week after she had been taken off amlodipine 10mg  due to edema, continued on losartan 100mg . That being said- very well could have been cardioembolic given atrial fibrillation. She presented with confusion/agitation/word finding difficulty.   from [facility] d/c summary "1) left posterior temporal lobe cortex CVA-seen on MRI head. Radiologist contacted me to also notify me of a small meningioma. Initially patient allowed permissive hypertension. Evaluated by neurology who recommended ASA and statin. Pt worked with PT/OT/ST. Pt's daughter/POA refused baby ASA and lovenox for DVT prophylaxis. She understands the risks of stroke and DVT. Continue atorvastatin as outpt. Pt had both carotid US and CTA head neck which showed no hemodynamically significant stenosis. Hemoglobin A1C was 5.2 Lipid panel showed total cholesterol of 162, HDL of 64, triglycerides of 79, and LDL of 82. Echo showed normal EF, no WMA, and no interatrial shunt. " A/P: Patient now on statin. Not on aspirin or anticoagulation given history of intracerebral hemorhage- they have a visit with Dr. Leonie Man to discuss anticoagulant or antiplatelet options but likely want to remain off. Stroke could have been cardioembolic. See hypertension section.

## 2018-03-31 NOTE — Assessment & Plan Note (Signed)
S:discovered at time of CVA 02/2018- repeat imaging 1 year per [facility] A/P: we reviewed this new finding (family and patient were unaware of it while hospitalized). We reviewed possibility of repeat scan in 1 year if patient is still doing well- they agree though they are open that they would not want to intervene even if this was a non benign meningioma- which I completely understand/validated given patient's age and comorbid conditions

## 2018-03-31 NOTE — Assessment & Plan Note (Signed)
S: controlled on losartan 100mg  and hydralazine 50mg  q8 hours. This schedule is very tough for patient as her sleep seems to help her with word finding issues and yet the afternoon dose of hydralazine wakes her up from her 1-5 pm nap everyday . Even with hydralazine BPs have been up to 166 which is higher than what #s were on amlodipine 10mg  and losartan 100mg  in past.  BP Readings from Last 3 Encounters:  03/31/18 138/86  03/11/18 132/80  02/25/18 122/80  A/P: We discussed blood pressure goal of <140/90 ideally due to stroke though <150/90 per age is more reasonable- luckily no recent orthostatic issues so think we can get her under 140. We also want to reduce risk of stroke due to BP but she has had multiple issues when tightly trying to control BP such as bradycardia on labetalol, orthostatic symptoms on amlodipine, hctz, losartan combo on in the past, significant edema on amlodipine 10mg  along with losartan.   The hydralazine is VERY difficult for patient and family. She has had palpitations on it as well. We discussed stopping it and transitioning to losartan 100mg  still but adding amlodipine 5 mg back with several week follow up for recheck- this would really simplify regimen and improve life balance so we opted for this. Also considered hctz 12.5mg  as alternate- would likely avoid combo of losartan, amlodipine, hctz at same time given prior orthostatic issues.

## 2018-03-31 NOTE — Progress Notes (Signed)
Subjective:  [name] is a 84 y.o. year old very pleasant female patient who presents for/with See problem oriented charting ROS- has word finding issues. No facial or extremity weakness. Walking with walker. No chest pain reported or shortness of breath - has had some palpitations since hydralazine   Past Medical History-  Patient Active Problem List   Diagnosis Date Noted  . Meningioma ([place]) 03/31/2018    Priority: High  . History of CVA in adulthood 03/31/2018    Priority: High  . Atrial fibrillation ([place]) 03/29/2015    Priority: High  . History of intracerebral hemorrhage      Priority: High  . Hyperlipidemia 03/15/2018    Priority: Medium  . Essential hypertension 06/22/2006    Priority: Medium  . Dizzy spells 11/10/2014    Priority: Low  . SPINAL STENOSIS, CERVICAL 06/11/2010    Priority: Low  . Arthritis of right knee 06/22/2006    Priority: Low  . Osteopenia 06/22/2006    Priority: Low  . Urinary incontinence 04/28/2017  . Reactive depression 08/14/2015  . Intention tremor 06/21/2015  . Cerebral amyloid angiopathy ([place]) 05/29/2015    Medications- reviewed and updated Current Outpatient Medications  Medication Sig Dispense Refill  . atorvastatin (LIPITOR) 40 MG tablet Take 1 tablet by mouth at bedtime.    Marland Kitchen Hydralazine 50mg  q8 hours.      Marland Kitchen losartan (COZAAR) 100 MG tablet Take 1 tablet (100 mg total) by mouth daily. 90 tablet 1  . Multiple Vitamin (MULTIVITAMIN) tablet Take 1 tablet by mouth daily.       No current facility-administered medications for this visit.     Objective: BP 138/86 (BP Location: Left Arm, Patient Position: Sitting, Cuff Size: Normal)   Pulse 62   Temp 97.9 F (36.6 C) (Oral)   Ht 4\' 11"  (1.499 m)   Wt 132 lb (59.9 kg)   SpO2 97%   BMI 26.66 kg/m  Gen: NAD, resting comfortably CV: RRR no murmurs rubs or gallops Lungs: CTAB no crackles, wheeze, rhonchi Abdomen: soft/nontender/nondistended Ext: no edema Skin: warm,  dry Neuro: slow to find words, 5-/5 strength diffusely in extremities  Assessment/Plan:  Hospital follow up for CVA  Meningioma [facility]) S:discovered at time of CVA 02/2018- repeat imaging 1 year per [facility] A/P: we reviewed this new finding (family and patient were unaware of it while hospitalized). We reviewed possibility of repeat scan in 1 year if patient is still doing well- they agree though they are open that they would not want to intervene even if this was a non benign meningioma- which I completely understand/validated given patient's age and comorbid conditions  History of CVA in adulthood S: 02/2018 Stroke happened after stressful week after she had been taken off amlodipine 10mg  due to edema, continued on losartan 100mg . That being said- very well could have been cardioembolic given atrial fibrillation. She presented with confusion/agitation/word finding difficulty.   from [facility] d/c summary "1) left posterior temporal lobe cortex CVA-seen on MRI head. Radiologist contacted me to also notify me of a small meningioma. Initially patient allowed permissive hypertension. Evaluated by neurology who recommended ASA and statin. Pt worked with PT/OT/ST. Pt's daughter/POA refused baby ASA and lovenox for DVT prophylaxis. She understands the risks of stroke and DVT. Continue atorvastatin as outpt. Pt had both carotid US and CTA head neck which showed no hemodynamically significant stenosis. Hemoglobin A1C was 5.2 Lipid panel showed total cholesterol of 162, HDL of 64, triglycerides of 79, and LDL  of 82. Echo showed normal EF, no WMA, and no interatrial shunt. " A/P: Patient now on statin. Not on aspirin or anticoagulation given history of intracerebral hemorhage- they have a visit with Dr. Leonie Man to discuss anticoagulant or antiplatelet options but likely want to remain off. Stroke could have been cardioembolic. See hypertension section.   Essential hypertension S: controlled on losartan  100mg  and hydralazine 50mg  q8 hours. This schedule is very tough for patient as her sleep seems to help her with word finding issues and yet the afternoon dose of hydralazine wakes her up from her 1-5 pm nap everyday . Even with hydralazine BPs have been up to 166 which is higher than what #s were on amlodipine 10mg  and losartan 100mg  in past.  BP Readings from Last 3 Encounters:  03/31/18 138/86  03/11/18 132/80  02/25/18 122/80  A/P: We discussed blood pressure goal of <140/90 ideally due to stroke though <150/90 per age is more reasonable- luckily no recent orthostatic issues so think we can get her under 140. We also want to reduce risk of stroke due to BP but she has had multiple issues when tightly trying to control BP such as bradycardia on labetalol, orthostatic symptoms on amlodipine, hctz, losartan combo on in the past, significant edema on amlodipine 10mg  along with losartan.   The hydralazine is VERY difficult for patient and family. She has had palpitations on it as well. We discussed stopping it and transitioning to losartan 100mg  still but adding amlodipine 5 mg back with several week follow up for recheck- this would really simplify regimen and improve life balance so we opted for this. Also considered hctz 12.5mg  as alternate- would likely avoid combo of losartan, amlodipine, hctz at same time given prior orthostatic issues.     Hyperlipidemia S: hospitalist Started statin in 02/2018 after stroke Lab Results  Component Value Date   CHOL 132 03/25/2015   HDL 38 (L) 03/25/2015   LDLCALC 75 03/25/2015   LDLDIRECT 99.0 04/28/2017   TRIG 95 03/25/2015   CHOLHDL 3.5 03/25/2015   A/P: continue atorvastatin 40 mg- hoping LDL under 700 on next check- would want to be at least 6 weeks out from starting before checking labs     Future Appointments  Date Time Provider [place]  04/08/2018  3:00 PM Marin Olp, MD [facility]  04/21/2018 11:00 AM Garvin Fila, MD  [facility] None    Meds ordered this encounter  Medications  . amLODipine (NORVASC) 5 MG tablet    Sig: Take 1 tablet (5 mg total) by mouth daily.    Dispense:  90 tablet    Refill:  3    Return precautions advised.  Garret Reddish, MD

## 2018-03-31 NOTE — Patient Instructions (Addendum)
Stop hydralazine  Start amlodipine 5mg  tonight  Lets follow up in 1-3 weeks. Bring log of blood pressures like you always do. I am hoping this keeps you under 150/90 consistently and that you dont get swelling with this lower dose compared to the 10mg  previously.

## 2018-04-01 DIAGNOSIS — M6281 Muscle weakness (generalized): Secondary | ICD-10-CM | POA: Diagnosis not present

## 2018-04-01 DIAGNOSIS — I69398 Other sequelae of cerebral infarction: Secondary | ICD-10-CM | POA: Diagnosis not present

## 2018-04-01 DIAGNOSIS — I69320 Aphasia following cerebral infarction: Secondary | ICD-10-CM | POA: Diagnosis not present

## 2018-04-01 DIAGNOSIS — M1990 Unspecified osteoarthritis, unspecified site: Secondary | ICD-10-CM | POA: Diagnosis not present

## 2018-04-01 DIAGNOSIS — I480 Paroxysmal atrial fibrillation: Secondary | ICD-10-CM | POA: Diagnosis not present

## 2018-04-01 DIAGNOSIS — I10 Essential (primary) hypertension: Secondary | ICD-10-CM | POA: Diagnosis not present

## 2018-04-01 DIAGNOSIS — M4802 Spinal stenosis, cervical region: Secondary | ICD-10-CM | POA: Diagnosis not present

## 2018-04-04 DIAGNOSIS — I69320 Aphasia following cerebral infarction: Secondary | ICD-10-CM | POA: Diagnosis not present

## 2018-04-04 DIAGNOSIS — I69398 Other sequelae of cerebral infarction: Secondary | ICD-10-CM | POA: Diagnosis not present

## 2018-04-04 DIAGNOSIS — M4802 Spinal stenosis, cervical region: Secondary | ICD-10-CM | POA: Diagnosis not present

## 2018-04-04 DIAGNOSIS — M1990 Unspecified osteoarthritis, unspecified site: Secondary | ICD-10-CM | POA: Diagnosis not present

## 2018-04-04 DIAGNOSIS — I10 Essential (primary) hypertension: Secondary | ICD-10-CM | POA: Diagnosis not present

## 2018-04-04 DIAGNOSIS — I480 Paroxysmal atrial fibrillation: Secondary | ICD-10-CM | POA: Diagnosis not present

## 2018-04-04 DIAGNOSIS — M6281 Muscle weakness (generalized): Secondary | ICD-10-CM | POA: Diagnosis not present

## 2018-04-05 ENCOUNTER — Telehealth: Payer: Self-pay | Admitting: Family Medicine

## 2018-04-05 DIAGNOSIS — M6281 Muscle weakness (generalized): Secondary | ICD-10-CM | POA: Diagnosis not present

## 2018-04-05 DIAGNOSIS — M1990 Unspecified osteoarthritis, unspecified site: Secondary | ICD-10-CM | POA: Diagnosis not present

## 2018-04-05 DIAGNOSIS — I69320 Aphasia following cerebral infarction: Secondary | ICD-10-CM | POA: Diagnosis not present

## 2018-04-05 DIAGNOSIS — I480 Paroxysmal atrial fibrillation: Secondary | ICD-10-CM | POA: Diagnosis not present

## 2018-04-05 DIAGNOSIS — I69398 Other sequelae of cerebral infarction: Secondary | ICD-10-CM | POA: Diagnosis not present

## 2018-04-05 DIAGNOSIS — I10 Essential (primary) hypertension: Secondary | ICD-10-CM | POA: Diagnosis not present

## 2018-04-05 DIAGNOSIS — M4802 Spinal stenosis, cervical region: Secondary | ICD-10-CM | POA: Diagnosis not present

## 2018-04-05 NOTE — Telephone Encounter (Signed)
Spoke to pt's daughter [name], she said Bp elevated this morning but pt had breakfast and coffee, wanted to know if they need to be concerned.  Asked her if pt is having headache, dizziness, SOB or chest pain? [name] said no. Told her to keep a log everyday of blood pressure and bring to appt on Friday with Dr. Yong Channel. If pt develops any symptoms need to call. Also can recheck blood pressure this afternoon. [name] verbalized understanding.

## 2018-04-05 NOTE — Telephone Encounter (Signed)
See note.   Copied from [place] [id number]. Topic: General - Other >> Apr 05, 2018  9:07 AM Judyann Munson wrote: Reason for CRM:  Joans patient daughter is calling to advise the patient blood pressure was 160/91 this morning at around 8:41am, she is wondering if that is average  because the patient  started a new medication amLODipine (NORVASC) 5 MG tablet on thurdsdayshe is requesting a nurse to call back number is # [phone]. Please advise

## 2018-04-06 ENCOUNTER — Telehealth: Payer: Self-pay | Admitting: *Deleted

## 2018-04-06 DIAGNOSIS — M4802 Spinal stenosis, cervical region: Secondary | ICD-10-CM | POA: Diagnosis not present

## 2018-04-06 DIAGNOSIS — I69398 Other sequelae of cerebral infarction: Secondary | ICD-10-CM | POA: Diagnosis not present

## 2018-04-06 DIAGNOSIS — M6281 Muscle weakness (generalized): Secondary | ICD-10-CM | POA: Diagnosis not present

## 2018-04-06 DIAGNOSIS — I480 Paroxysmal atrial fibrillation: Secondary | ICD-10-CM | POA: Diagnosis not present

## 2018-04-06 DIAGNOSIS — M1990 Unspecified osteoarthritis, unspecified site: Secondary | ICD-10-CM | POA: Diagnosis not present

## 2018-04-06 DIAGNOSIS — I69320 Aphasia following cerebral infarction: Secondary | ICD-10-CM | POA: Diagnosis not present

## 2018-04-06 DIAGNOSIS — I10 Essential (primary) hypertension: Secondary | ICD-10-CM | POA: Diagnosis not present

## 2018-04-06 NOTE — Telephone Encounter (Signed)
Called and spoke with [place]. Order provided as requested.

## 2018-04-06 NOTE — Telephone Encounter (Signed)
Copied from [place] [id number]. Topic: Inquiry >> Apr 06, 2018 10:45 AM Pricilla Handler wrote: Reason for CRM: Eliezer Lofts with [facility] [phone]) called requesting to move one of the patient's Speech Therapy appts to next week. [facility]'s Speech Therapist has been out sick this week, and missed both appts with the patient. They just want to move one appt to next week. Please call Eliezer Lofts with [facility] at [phone].       Thank You!!!

## 2018-04-07 DIAGNOSIS — I10 Essential (primary) hypertension: Secondary | ICD-10-CM | POA: Diagnosis not present

## 2018-04-07 DIAGNOSIS — I480 Paroxysmal atrial fibrillation: Secondary | ICD-10-CM | POA: Diagnosis not present

## 2018-04-07 DIAGNOSIS — I69320 Aphasia following cerebral infarction: Secondary | ICD-10-CM | POA: Diagnosis not present

## 2018-04-07 DIAGNOSIS — M6281 Muscle weakness (generalized): Secondary | ICD-10-CM | POA: Diagnosis not present

## 2018-04-07 DIAGNOSIS — I69398 Other sequelae of cerebral infarction: Secondary | ICD-10-CM | POA: Diagnosis not present

## 2018-04-07 DIAGNOSIS — M4802 Spinal stenosis, cervical region: Secondary | ICD-10-CM | POA: Diagnosis not present

## 2018-04-07 DIAGNOSIS — M1990 Unspecified osteoarthritis, unspecified site: Secondary | ICD-10-CM | POA: Diagnosis not present

## 2018-04-08 ENCOUNTER — Ambulatory Visit (INDEPENDENT_AMBULATORY_CARE_PROVIDER_SITE_OTHER): Payer: Medicare Other | Admitting: Family Medicine

## 2018-04-08 ENCOUNTER — Encounter: Payer: Self-pay | Admitting: Family Medicine

## 2018-04-08 VITALS — BP 150/76 | HR 63 | Temp 98.1°F | Ht 59.0 in | Wt 128.4 lb

## 2018-04-08 DIAGNOSIS — I480 Paroxysmal atrial fibrillation: Secondary | ICD-10-CM | POA: Diagnosis not present

## 2018-04-08 DIAGNOSIS — M6281 Muscle weakness (generalized): Secondary | ICD-10-CM | POA: Diagnosis not present

## 2018-04-08 DIAGNOSIS — I10 Essential (primary) hypertension: Secondary | ICD-10-CM | POA: Diagnosis not present

## 2018-04-08 DIAGNOSIS — I69320 Aphasia following cerebral infarction: Secondary | ICD-10-CM | POA: Diagnosis not present

## 2018-04-08 DIAGNOSIS — M1990 Unspecified osteoarthritis, unspecified site: Secondary | ICD-10-CM | POA: Diagnosis not present

## 2018-04-08 DIAGNOSIS — I69398 Other sequelae of cerebral infarction: Secondary | ICD-10-CM | POA: Diagnosis not present

## 2018-04-08 DIAGNOSIS — M4802 Spinal stenosis, cervical region: Secondary | ICD-10-CM | POA: Diagnosis not present

## 2018-04-08 NOTE — Patient Instructions (Signed)
2-4 week recheck  Blood pressure slightly high with 144/76 average- would prefer lower but dont want to cause falls/passing out. Since the last 4 days have looked so good though lets continue current dose of medicine and recheck at next visit. Plus I should see Dr. Leonie Man note and I may reach out if he notes it has gone back up. Im leaning toward just doing 10 mg again and tolerating the swelling if it goes back up

## 2018-04-08 NOTE — Assessment & Plan Note (Signed)
S: controlled poorly in office on Losartan 100mg , amlodipine 5 mg. Quality of life much higher on this then when she was on losartan and TID hydralazine.   Her 15 day average I s 143.8/76.4 but her last 4 days have all been <140/80. She has very slight edema.  BP Readings from Last 3 Encounters:  04/08/18 (!) 150/76  03/31/18 138/86  03/11/18 132/80  A/P: We discussed blood pressure goal of <140/90 ideally given stroke history even <130/80 but at her age we are cautious about being aggressive. Continue current meds since excellent control last 4 days at home. She will see me back after seeing neurology. We may consider going to 10mg  amlodipine and just tolerating edema (see SE of prior trials of meds with significant bradycardia on labetalol, ortho stasis on hctz )

## 2018-04-08 NOTE — Progress Notes (Signed)
Subjective:  [name] is a 84 y.o. year old very pleasant female patient who presents for/with See problem oriented charting ROS- no chest pain or shortness of breath. Still some word finding issues. Still using walker to help her gait.    Past Medical History-  Patient Active Problem List   Diagnosis Date Noted  . Meningioma ([place]) 03/31/2018    Priority: High  . History of CVA in adulthood 03/31/2018    Priority: High  . Atrial fibrillation ([place]) 03/29/2015    Priority: High  . History of intracerebral hemorrhage      Priority: High  . Hyperlipidemia 03/15/2018    Priority: Medium  . Essential hypertension 06/22/2006    Priority: Medium  . Dizzy spells 11/10/2014    Priority: Low  . SPINAL STENOSIS, CERVICAL 06/11/2010    Priority: Low  . Arthritis of right knee 06/22/2006    Priority: Low  . Osteopenia 06/22/2006    Priority: Low  . Urinary incontinence 04/28/2017  . Reactive depression 08/14/2015  . Intention tremor 06/21/2015  . Cerebral amyloid angiopathy ([place]) 05/29/2015    Medications- reviewed and updated Current Outpatient Medications  Medication Sig Dispense Refill  . amLODipine (NORVASC) 5 MG tablet Take 1 tablet (5 mg total) by mouth daily. 90 tablet 3  . atorvastatin (LIPITOR) 40 MG tablet Take 1 tablet by mouth at bedtime.    Marland Kitchen losartan (COZAAR) 100 MG tablet Take 1 tablet (100 mg total) by mouth daily. 90 tablet 1  . Multiple Vitamin (MULTIVITAMIN) tablet Take 1 tablet by mouth daily.       No current facility-administered medications for this visit.     Objective: BP (!) 150/76 (BP Location: Left Arm, Patient Position: Sitting, Cuff Size: Normal)   Pulse 63   Temp 98.1 F (36.7 C) (Oral)   Ht 4\' 11"  (1.499 m)   Wt 128 lb 6.1 oz (58.2 kg)   SpO2 95%   BMI 25.93 kg/m  Gen: NAD, resting comfortably CV: RRR no murmurs rubs or gallops Lungs: CTAB no crackles, wheeze, rhonchi Abdomen: soft/nontender/nondistended Ext: no  edema   Assessment/Plan:  Essential hypertension S: controlled poorly in office on Losartan 100mg , amlodipine 5 mg. Quality of life much higher on this then when she was on losartan and TID hydralazine.   Her 15 day average I s 143.8/76.4 but her last 4 days have all been <140/80. She has very slight edema.  BP Readings from Last 3 Encounters:  04/08/18 (!) 150/76  03/31/18 138/86  03/11/18 132/80  A/P: We discussed blood pressure goal of <140/90 ideally given stroke history even <130/80 but at her age we are cautious about being aggressive. Continue current meds since excellent control last 4 days at home. She will see me back after seeing neurology. We may consider going to 10mg  amlodipine and just tolerating edema (see SE of prior trials of meds with significant bradycardia on labetalol, ortho stasis on hctz )    Future Appointments  Date Time Provider [place]  04/21/2018 11:00 AM Garvin Fila, MD [facility] None  05/02/2018 11:15 AM Marin Olp, MD [facility]   Return precautions advised.  Garret Reddish, MD

## 2018-04-11 DIAGNOSIS — M1990 Unspecified osteoarthritis, unspecified site: Secondary | ICD-10-CM | POA: Diagnosis not present

## 2018-04-11 DIAGNOSIS — I69398 Other sequelae of cerebral infarction: Secondary | ICD-10-CM | POA: Diagnosis not present

## 2018-04-11 DIAGNOSIS — I69320 Aphasia following cerebral infarction: Secondary | ICD-10-CM | POA: Diagnosis not present

## 2018-04-11 DIAGNOSIS — I480 Paroxysmal atrial fibrillation: Secondary | ICD-10-CM | POA: Diagnosis not present

## 2018-04-11 DIAGNOSIS — I10 Essential (primary) hypertension: Secondary | ICD-10-CM | POA: Diagnosis not present

## 2018-04-11 DIAGNOSIS — M4802 Spinal stenosis, cervical region: Secondary | ICD-10-CM | POA: Diagnosis not present

## 2018-04-11 DIAGNOSIS — M6281 Muscle weakness (generalized): Secondary | ICD-10-CM | POA: Diagnosis not present

## 2018-04-12 DIAGNOSIS — I69398 Other sequelae of cerebral infarction: Secondary | ICD-10-CM | POA: Diagnosis not present

## 2018-04-12 DIAGNOSIS — I69320 Aphasia following cerebral infarction: Secondary | ICD-10-CM | POA: Diagnosis not present

## 2018-04-12 DIAGNOSIS — I480 Paroxysmal atrial fibrillation: Secondary | ICD-10-CM | POA: Diagnosis not present

## 2018-04-12 DIAGNOSIS — M6281 Muscle weakness (generalized): Secondary | ICD-10-CM | POA: Diagnosis not present

## 2018-04-12 DIAGNOSIS — M4802 Spinal stenosis, cervical region: Secondary | ICD-10-CM | POA: Diagnosis not present

## 2018-04-12 DIAGNOSIS — M1990 Unspecified osteoarthritis, unspecified site: Secondary | ICD-10-CM | POA: Diagnosis not present

## 2018-04-12 DIAGNOSIS — I10 Essential (primary) hypertension: Secondary | ICD-10-CM | POA: Diagnosis not present

## 2018-04-14 DIAGNOSIS — I10 Essential (primary) hypertension: Secondary | ICD-10-CM | POA: Diagnosis not present

## 2018-04-14 DIAGNOSIS — I480 Paroxysmal atrial fibrillation: Secondary | ICD-10-CM | POA: Diagnosis not present

## 2018-04-14 DIAGNOSIS — M6281 Muscle weakness (generalized): Secondary | ICD-10-CM | POA: Diagnosis not present

## 2018-04-14 DIAGNOSIS — I69398 Other sequelae of cerebral infarction: Secondary | ICD-10-CM | POA: Diagnosis not present

## 2018-04-14 DIAGNOSIS — I69320 Aphasia following cerebral infarction: Secondary | ICD-10-CM | POA: Diagnosis not present

## 2018-04-14 DIAGNOSIS — M1990 Unspecified osteoarthritis, unspecified site: Secondary | ICD-10-CM | POA: Diagnosis not present

## 2018-04-14 DIAGNOSIS — M4802 Spinal stenosis, cervical region: Secondary | ICD-10-CM | POA: Diagnosis not present

## 2018-04-18 ENCOUNTER — Telehealth: Payer: Self-pay | Admitting: Family Medicine

## 2018-04-18 DIAGNOSIS — M1990 Unspecified osteoarthritis, unspecified site: Secondary | ICD-10-CM | POA: Diagnosis not present

## 2018-04-18 DIAGNOSIS — I480 Paroxysmal atrial fibrillation: Secondary | ICD-10-CM | POA: Diagnosis not present

## 2018-04-18 DIAGNOSIS — I10 Essential (primary) hypertension: Secondary | ICD-10-CM | POA: Diagnosis not present

## 2018-04-18 DIAGNOSIS — I69320 Aphasia following cerebral infarction: Secondary | ICD-10-CM | POA: Diagnosis not present

## 2018-04-18 DIAGNOSIS — M4802 Spinal stenosis, cervical region: Secondary | ICD-10-CM | POA: Diagnosis not present

## 2018-04-18 DIAGNOSIS — M6281 Muscle weakness (generalized): Secondary | ICD-10-CM | POA: Diagnosis not present

## 2018-04-18 DIAGNOSIS — I69398 Other sequelae of cerebral infarction: Secondary | ICD-10-CM | POA: Diagnosis not present

## 2018-04-18 NOTE — Telephone Encounter (Signed)
[place] and left detailed message on [name]'s voicemail, verbal order given to move discharge from speech therapy from last week to this week okay per Dr. Yong Channel. Any questions please call office.

## 2018-04-18 NOTE — Telephone Encounter (Signed)
Copied from [place] [id number]. Topic: Quick Communication - See Telephone Encounter >> Apr 18, 2018  8:40 AM Mylinda Latina, NT wrote: CRM for notification. See Telephone encounter for: 04/18/18. Deirdre calling from [facility] is requesting verbal orders. The orders are to move the discharge from speech therapy from last week to this week. She has met all her goal. CB# [phone] Can leave detailed message it is secure

## 2018-04-18 NOTE — Telephone Encounter (Signed)
See note

## 2018-04-19 DIAGNOSIS — M6281 Muscle weakness (generalized): Secondary | ICD-10-CM | POA: Diagnosis not present

## 2018-04-19 DIAGNOSIS — I69393 Ataxia following cerebral infarction: Secondary | ICD-10-CM | POA: Diagnosis not present

## 2018-04-19 DIAGNOSIS — M4802 Spinal stenosis, cervical region: Secondary | ICD-10-CM

## 2018-04-19 DIAGNOSIS — I69398 Other sequelae of cerebral infarction: Secondary | ICD-10-CM | POA: Diagnosis not present

## 2018-04-19 DIAGNOSIS — I480 Paroxysmal atrial fibrillation: Secondary | ICD-10-CM | POA: Diagnosis not present

## 2018-04-19 DIAGNOSIS — I10 Essential (primary) hypertension: Secondary | ICD-10-CM | POA: Diagnosis not present

## 2018-04-19 DIAGNOSIS — I69320 Aphasia following cerebral infarction: Secondary | ICD-10-CM | POA: Diagnosis not present

## 2018-04-19 DIAGNOSIS — M1990 Unspecified osteoarthritis, unspecified site: Secondary | ICD-10-CM | POA: Diagnosis not present

## 2018-04-21 ENCOUNTER — Ambulatory Visit: Payer: Medicare Other | Admitting: Neurology

## 2018-04-21 ENCOUNTER — Encounter: Payer: Self-pay | Admitting: Neurology

## 2018-04-21 ENCOUNTER — Telehealth: Payer: Self-pay | Admitting: *Deleted

## 2018-04-21 VITALS — BP 127/60 | HR 75 | Ht 59.0 in | Wt 130.0 lb

## 2018-04-21 DIAGNOSIS — I69398 Other sequelae of cerebral infarction: Secondary | ICD-10-CM | POA: Diagnosis not present

## 2018-04-21 DIAGNOSIS — M6281 Muscle weakness (generalized): Secondary | ICD-10-CM | POA: Diagnosis not present

## 2018-04-21 DIAGNOSIS — M4802 Spinal stenosis, cervical region: Secondary | ICD-10-CM | POA: Diagnosis not present

## 2018-04-21 DIAGNOSIS — R4701 Aphasia: Secondary | ICD-10-CM

## 2018-04-21 DIAGNOSIS — I480 Paroxysmal atrial fibrillation: Secondary | ICD-10-CM | POA: Diagnosis not present

## 2018-04-21 DIAGNOSIS — I10 Essential (primary) hypertension: Secondary | ICD-10-CM | POA: Diagnosis not present

## 2018-04-21 DIAGNOSIS — M1990 Unspecified osteoarthritis, unspecified site: Secondary | ICD-10-CM | POA: Diagnosis not present

## 2018-04-21 DIAGNOSIS — I69320 Aphasia following cerebral infarction: Secondary | ICD-10-CM | POA: Diagnosis not present

## 2018-04-21 NOTE — Progress Notes (Signed)
[facility] [address] [place]. [place] [address] [phone]       OFFICE FOLLOW-UP NOTE  Ms. [name] Date of Birth:  [birth year] Medical Record Number:  [id number]   HPI: 84 y.o. Caucasian female with PMH of hypertension and left Bell's palsy many years ago was admitted on 03/19/2015 for large right temporal ICH with IVH and cerebral edema. Etiology not clear, concerning for CAA. History of chronic atrial fibrillation but not a good candidate for anticoagulation or antiplatelet therapy as she had intracerebral hemorrhage given on aspirin in the past. She was last seen by Dr. Erlinda Hong in the office on 06/23/2016. She returns for follow-up today following recent admission to [facility] for hypertensive encephalopathy and questionable stroke. Patient is a Coumadin by her daughter who provides the history. Patient was having issues with blood pressure control and was admitted with significantly elevated blood pressure of systolic greater than 528 to [facility].I have   reviewed her records in care everywhere She blames this on stress that she had been having. She had some speech and word finding difficulties and garbled speech. CT scan of the head was unremarkable but MRI scan of the brain done on 03/14/18 showed and is unclear whether this represented stroke or just changes of hypertensive encephalopathy. The patient's blood pressure is well controlled and subsequently she is doing better. She still has some occasional word finding difficulties particularly when she is tired. She is able to ambulate with a walker. She states often a blood pressure is quite well controlled and today it is 127/60. Amlodipine 5 mg has been added to her Cozaar which seems to be working quite well. She also remains on Lipitor which is tolerating well. She is off antiplatelet therapy as well as anticoagulation due to prior history of intracerebral hemorrhage.she has regular follow-up  with her primary care physician Dr. Garret Reddish.  ROS:   14 system review of systems is positive for  Decreased hearing, speech difficulty, gait and balance difficulties and all other system negative  PMH:  Past Medical History:  Diagnosis Date  . Hypertension   . Osteopenia   . Stroke [facility])     Social History:  Social History   Socioeconomic History  . Marital status: Married    Spouse name: Not on file  . Number of children: Not on file  . Years of education: Not on file  . Highest education level: Not on file  Occupational History  . Not on file  Social Needs  . Financial resource strain: Not on file  . Food insecurity:    Worry: Not on file    Inability: Not on file  . Transportation needs:    Medical: Not on file    Non-medical: Not on file  Tobacco Use  . Smoking status: Never Smoker  . Smokeless tobacco: Never Used  Substance and Sexual Activity  . Alcohol use: Yes    Alcohol/week: 1.0 standard drinks    Types: 1 Glasses of wine per week    Comment: 1 glass with dinner  . Drug use: No  . Sexual activity: Not on file  Lifestyle  . Physical activity:    Days per week: Not on file    Minutes per session: Not on file  . Stress: Not on file  Relationships  . Social connections:    Talks on phone: Not on file    Gets together: Not on file    Attends religious service:  Not on file    Active member of club or organization: Not on file    Attends meetings of clubs or organizations: Not on file    Relationship status: Not on file  . Intimate partner violence:    Fear of current or ex partner: Not on file    Emotionally abused: Not on file    Physically abused: Not on file    Forced sexual activity: Not on file  Other Topics Concern  . Not on file  Social History Narrative   Married (husband Tom age 35 in [place] practice) 51. 2 [place] oldest daughter to breast cancer. 4 grandkids.       Husband and patient live with daughter.        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       Hobbies: reading, had been quilting, enjoys cooking/baking.       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    Full Code.           Medications:   Current Outpatient Medications on File Prior to Visit  Medication Sig Dispense Refill  . amLODipine (NORVASC) 5 MG tablet Take 1 tablet (5 mg total) by mouth daily. 90 tablet 3  . atorvastatin (LIPITOR) 40 MG tablet Take 1 tablet by mouth at bedtime.    Marland Kitchen losartan (COZAAR) 100 MG tablet Take 1 tablet (100 mg total) by mouth daily. 90 tablet 1  . Multiple Vitamin (MULTIVITAMIN) tablet Take 1 tablet by mouth daily.       No current facility-administered medications on file prior to visit.     Allergies:   Allergies  Allergen Reactions  . Ace Inhibitors     REACTION: angioedema    Physical Exam General: frail elderly Caucasian lady, seated, in no evident distress Head: head normocephalic and atraumatic.  Neck: supple with no carotid or supraclavicular bruits Cardiovascular: regular rate and rhythm, no murmurs Musculoskeletal: no deformity Skin:  no rash/petichiae Vascular:  Normal pulses all extremities Vitals:   04/21/18 1054  BP: 127/60  Pulse: 75 irregularly irregular   Neurologic Exam Mental Status: Awake and fully alert. Oriented to place and time. Recent and remote memory intact. Attention span, concentration and fund of knowledge appropriate. Mood and affect appropriate. Speech slightly nonfluent with occasional word hesitation. No paraphasic errors. Good comprehension, naming and repetition Cranial Nerves: Fundoscopic exam reveals sharp disc margins. Pupils equal, sluggishly reactive to light. Left eyelid mech drooping.Extraocular movements full without nystagmus. Visual fields full to confrontation. Hearing diminsished  Bilaterally.Facial sensation intact. Face, tongue, palate moves normally and symmetrically.  Motor: Normal bulk and tone. Normal strength in all  tested extremity muscles. Sensory.: intact to touch ,pinprick .position and vibratory sensation.  Coordination: Rapid alternating movements normal in all extremities. Finger-to-nose and heel-to-shin performed accurately bilaterally. Gait and Station: Arises from chair without difficulty. Stance is stooped. Gait demonstrates mild festination and uses a walker.. Not Able to heel, toe and tandem walk without difficulty.  Reflexes: 1+ and symmetric. Toes downgoing.   NIHSS  1 Modified [place]in  3   ASSESSMENT: 84 year old Caucasian lady with remote history of intracerebral hemorrhage and chronic atrial fibrillation who is not a candidate for anticoagulation or antiplatelet therapy with recent admission in July 2019 for hypertensive encephalopathy and transient speech difficulties with abnormal MRI questionable changes of PRES versus new stroke     PLAN: I had a long d/w patient and her daughterabout her recent small embolic stroke,atrial fibrillation and prior brain hemorrhage, possible  amyloid angiopathy, risk for recurrent stroke/TIAs, personally independently reviewed imaging studies and stroke evaluation results and answered questions. Patient continuous to not want to be on either aspirin or anticoagulation despite atrial fibrillation due to prior history of brain hemorrhage even on aspirin  for secondary stroke prevention . I recommend she maintain strict control of hypertension with blood pressure goal below 130/90, diabetes with hemoglobin A1c goal below 6.5% and lipids with LDL cholesterol goal below 70 mg/dL. I also advised the patient to eat a healthy diet with plenty of whole grains, cereals, fruits and vegetables, exercise regularly and maintain ideal body weight.she was also advised to use a walker at all times and to discuss fall and safety precautions. No  need for any scheduled follow-up appointment but she may be referred back in the future as needed Greater than 50% of time during this  25 minute visit was spent on counseling,explanation of diagnosis embolic stroke, atrial fibrillation, remote intracranial hemorrhage, planning of further management, discussion with patient and family and coordination of care Antony Contras, MD  [facility] [address] [place], [place] [address]  Phone [phone] Fax [phone] Note: This document was prepared with digital dictation and possible smart phrase technology. Any transcriptional errors that result from this process are unintentional

## 2018-04-21 NOTE — Telephone Encounter (Signed)
I received medical records from [facility], notes on Mabel desk.

## 2018-04-21 NOTE — Patient Instructions (Signed)
I had a long d/w patient and her daughterabout her recent small embolic stroke,atrial fibrillation and prior brain hemorrhage, possible amyloid angiopathy, risk for recurrent stroke/TIAs, personally independently reviewed imaging studies and stroke evaluation results and answered questions. Patient continuous to not want to be on either aspirin or anticoagulation despite atrial fibrillation due to prior history of brain hemorrhage even on aspirin  for secondary stroke prevention . I recommend she maintain strict control of hypertension with blood pressure goal below 130/90, diabetes with hemoglobin A1c goal below 6.5% and lipids with LDL cholesterol goal below 70 mg/dL. I also advised the patient to eat a healthy diet with plenty of whole grains, cereals, fruits and vegetables, exercise regularly and maintain ideal body weight.she was also advised to use a walker at all times and to discuss fall and safety precautions. No  need for any scheduled follow-up appointment but she may be referred back in the future as needed   Fall Prevention in the [place] can cause injuries. They can happen to people of all ages. There are many things you can do to make your home safe and to help prevent falls. What can I do on the outside of my home?  Regularly fix the edges of walkways and driveways and fix any cracks.  Remove anything that might make you trip as you walk through a door, such as a raised step or threshold.  Trim any bushes or trees on the path to your home.  Use bright outdoor lighting.  Clear any walking paths of anything that might make someone trip, such as rocks or tools.  Regularly check to see if handrails are loose or broken. Make sure that both sides of any steps have handrails.  Any raised decks and porches should have guardrails on the edges.  Have any leaves, snow, or ice cleared regularly.  Use sand or salt on walking paths during winter.  Clean up any spills in your garage  right away. This includes oil or grease spills. What can I do in the bathroom?  Use night lights.  Install grab bars by the toilet and in the tub and shower. Do not use towel bars as grab bars.  Use non-skid mats or decals in the tub or shower.  If you need to sit down in the shower, use a plastic, non-slip stool.  Keep the floor dry. Clean up any water that spills on the floor as soon as it happens.  Remove soap buildup in the tub or shower regularly.  Attach bath mats securely with double-sided non-slip rug tape.  Do not have throw rugs and other things on the floor that can make you trip. What can I do in the bedroom?  Use night lights.  Make sure that you have a light by your bed that is easy to reach.  Do not use any sheets or blankets that are too big for your bed. They should not hang down onto the floor.  Have a firm chair that has side arms. You can use this for support while you get dressed.  Do not have throw rugs and other things on the floor that can make you trip. What can I do in the kitchen?  Clean up any spills right away.  Avoid walking on wet floors.  Keep items that you use a lot in easy-to-reach places.  If you need to reach something above you, use a strong step stool that has a grab bar.  Keep electrical cords out of  the way.  Do not use floor polish or wax that makes floors slippery. If you must use wax, use non-skid floor wax.  Do not have throw rugs and other things on the floor that can make you trip. What can I do with my stairs?  Do not leave any items on the stairs.  Make sure that there are handrails on both sides of the stairs and use them. Fix handrails that are broken or loose. Make sure that handrails are as long as the stairways.  Check any carpeting to make sure that it is firmly attached to the stairs. Fix any carpet that is loose or worn.  Avoid having throw rugs at the top or bottom of the stairs. If you do have throw rugs,  attach them to the floor with carpet tape.  Make sure that you have a light switch at the top of the stairs and the bottom of the stairs. If you do not have them, ask someone to add them for you. What else can I do to help prevent falls?  Wear shoes that: ? Do not have high heels. ? Have rubber bottoms. ? Are comfortable and fit you well. ? Are closed at the toe. Do not wear sandals.  If you use a stepladder: ? Make sure that it is fully opened. Do not climb a closed stepladder. ? Make sure that both sides of the stepladder are locked into place. ? Ask someone to hold it for you, if possible.  Clearly mark and make sure that you can see: ? Any grab bars or handrails. ? First and last steps. ? Where the edge of each step is.  Use tools that help you move around (mobility aids) if they are needed. These include: ? Canes. ? Walkers. ? Scooters. ? Crutches.  Turn on the lights when you go into a dark area. Replace any light bulbs as soon as they burn out.  Set up your furniture so you have a clear path. Avoid moving your furniture around.  If any of your floors are uneven, fix them.  If there are any pets around you, be aware of where they are.  Review your medicines with your doctor. Some medicines can make you feel dizzy. This can increase your chance of falling. Ask your doctor what other things that you can do to help prevent falls. This information is not intended to replace advice given to you by your health care provider. Make sure you discuss any questions you have with your health care provider. Document Released: 06/06/2009 Document Revised: 01/16/2016 Document Reviewed: 09/14/2014 Elsevier Interactive Patient Education  [employer].

## 2018-04-26 DIAGNOSIS — M1990 Unspecified osteoarthritis, unspecified site: Secondary | ICD-10-CM | POA: Diagnosis not present

## 2018-04-26 DIAGNOSIS — I69320 Aphasia following cerebral infarction: Secondary | ICD-10-CM | POA: Diagnosis not present

## 2018-04-26 DIAGNOSIS — M4802 Spinal stenosis, cervical region: Secondary | ICD-10-CM | POA: Diagnosis not present

## 2018-04-26 DIAGNOSIS — I69398 Other sequelae of cerebral infarction: Secondary | ICD-10-CM | POA: Diagnosis not present

## 2018-04-26 DIAGNOSIS — M6281 Muscle weakness (generalized): Secondary | ICD-10-CM | POA: Diagnosis not present

## 2018-04-26 DIAGNOSIS — I480 Paroxysmal atrial fibrillation: Secondary | ICD-10-CM | POA: Diagnosis not present

## 2018-04-26 DIAGNOSIS — I10 Essential (primary) hypertension: Secondary | ICD-10-CM | POA: Diagnosis not present

## 2018-05-02 ENCOUNTER — Encounter: Payer: Self-pay | Admitting: Family Medicine

## 2018-05-02 ENCOUNTER — Ambulatory Visit (INDEPENDENT_AMBULATORY_CARE_PROVIDER_SITE_OTHER): Payer: Medicare Other | Admitting: Family Medicine

## 2018-05-02 VITALS — BP 138/62 | HR 59 | Temp 97.6°F | Ht 59.0 in | Wt 128.8 lb

## 2018-05-02 DIAGNOSIS — Z23 Encounter for immunization: Secondary | ICD-10-CM | POA: Diagnosis not present

## 2018-05-02 DIAGNOSIS — I10 Essential (primary) hypertension: Secondary | ICD-10-CM

## 2018-05-02 DIAGNOSIS — E785 Hyperlipidemia, unspecified: Secondary | ICD-10-CM

## 2018-05-02 LAB — LIPID PANEL
CHOLESTEROL: 134 mg/dL (ref 0–200)
HDL: 66.3 mg/dL (ref >39.00)
LDL CALC: 47 mg/dL (ref 0–99)
NonHDL: 67.81
TRIGLYCERIDES: 105 mg/dL (ref 0.0–149.0)
Total CHOL/HDL Ratio: 2
VLDL: 21 mg/dL (ref 0.0–40.0)

## 2018-05-02 LAB — TSH: TSH: 3.66 u[IU]/mL (ref 0.35–4.50)

## 2018-05-02 NOTE — Patient Instructions (Signed)
Blood pressure looks great here and at home!   [place] [facility]y was ok with current plan  Please stop by lab before you go

## 2018-05-02 NOTE — Assessment & Plan Note (Signed)
S:  reasonably controlled on last check- she is on atorvastatin 40mg  now Lab Results  Component Value Date   CHOL 132 03/25/2015   HDL 38 (L) 03/25/2015   LDLCALC 75 03/25/2015   LDLDIRECT 99.0 04/28/2017   TRIG 95 03/25/2015   CHOLHDL 3.5 03/25/2015   A/P: update lipids- hoping LDL under 70 due to CVA history. nonfasting today

## 2018-05-02 NOTE — Assessment & Plan Note (Deleted)
S:  reasonably controlled on last check- she is on atorvastatin 40mg  now Lab Results  Component Value Date   CHOL 132 03/25/2015   HDL 38 (L) 03/25/2015   LDLCALC 75 03/25/2015   LDLDIRECT 99.0 04/28/2017   TRIG 95 03/25/2015   CHOLHDL 3.5 03/25/2015   A/P: update lipids- hoping LDL under 70 due to CVA history. nonfasting today

## 2018-05-02 NOTE — Assessment & Plan Note (Signed)
S: controlled on losartan 100mg , amlodipine 5mg . Once again better quality of life is improved compared to TID hydralazine and edema has been manageable.   Home #s- 133.7/73 Over 26 readings BP Readings from Last 3 Encounters:  05/02/18 138/62  04/21/18 127/60  04/08/18 (!) 150/76  A/P: We discussed blood pressure goal of <140/90- would prefer lower if possible but with orthostatic history in addition will leave this goal. Continue current regimen

## 2018-05-02 NOTE — Addendum Note (Signed)
Addended by: Marin Olp on: 05/02/2018 11:53 AM   Modules accepted: Level of Service

## 2018-05-02 NOTE — Progress Notes (Signed)
Subjective:  [name] is a 84 y.o. year old very pleasant female patient who presents for/with See problem oriented charting ROS- continued issues with speech but seems to be getting better. Walking with walker. No chest pain or shortness of breath reported   Past Medical History-  Patient Active Problem List   Diagnosis Date Noted  . Meningioma ([place]) 03/31/2018    Priority: High  . History of CVA in adulthood 03/31/2018    Priority: High  . Atrial fibrillation ([place]) 03/29/2015    Priority: High  . History of intracerebral hemorrhage      Priority: High  . Hyperlipidemia 03/15/2018    Priority: Medium  . Essential hypertension 06/22/2006    Priority: Medium  . Dizzy spells 11/10/2014    Priority: Low  . SPINAL STENOSIS, CERVICAL 06/11/2010    Priority: Low  . Arthritis of right knee 06/22/2006    Priority: Low  . Osteopenia 06/22/2006    Priority: Low  . Urinary incontinence 04/28/2017  . Reactive depression 08/14/2015  . Intention tremor 06/21/2015  . Cerebral amyloid angiopathy ([place]) 05/29/2015    Medications- reviewed and updated Current Outpatient Medications  Medication Sig Dispense Refill  . amLODipine (NORVASC) 5 MG tablet Take 1 tablet (5 mg total) by mouth daily. 90 tablet 3  . atorvastatin (LIPITOR) 40 MG tablet Take 1 tablet by mouth at bedtime.    Marland Kitchen losartan (COZAAR) 100 MG tablet Take 1 tablet (100 mg total) by mouth daily. 90 tablet 1  . Multiple Vitamin (MULTIVITAMIN) tablet Take 1 tablet by mouth daily.       No current facility-administered medications for this visit.     Objective: BP 138/62 (BP Location: Left Arm, Patient Position: Sitting, Cuff Size: Large)   Pulse (!) 59   Temp 97.6 F (36.4 C) (Oral)   Ht 4\' 11"  (1.499 m)   Wt 128 lb 12.8 oz (58.4 kg)   SpO2 95%   BMI 26.01 kg/m  Gen: NAD, resting comfortably CV: RRR no murmurs rubs or gallops Lungs: CTAB no crackles, wheeze, rhonchi Ext: no edema Skin: warm,  dry  Assessment/Plan:  Hyperlipidemia S:  reasonably controlled on last check- she is on atorvastatin 40mg  now Lab Results  Component Value Date   CHOL 132 03/25/2015   HDL 38 (L) 03/25/2015   LDLCALC 75 03/25/2015   LDLDIRECT 99.0 04/28/2017   TRIG 95 03/25/2015   CHOLHDL 3.5 03/25/2015   A/P: update lipids- hoping LDL under 70 due to CVA history. nonfasting today  Essential hypertension S: controlled on losartan 100mg , amlodipine 5mg . Once again better quality of life is improved compared to TID hydralazine and edema has been manageable.   Home #s- 133.7/73 Over 26 readings BP Readings from Last 3 Encounters:  05/02/18 138/62  04/21/18 127/60  04/08/18 (!) 150/76  A/P: We discussed blood pressure goal of <140/90- would prefer lower if possible but with orthostatic history in addition will leave this goal. Continue current regimen  Future Appointments  Date Time Provider [place]  08/01/2018 11:15 AM Marin Olp, MD [facility]   Return in about 3 months (around 08/01/2018) for follow up- or sooner if needed.  Lab/Order associations: not fasting Hyperlipidemia, unspecified hyperlipidemia type - Plan: Lipid panel, TSH  Need for prophylactic vaccination and inoculation against influenza - Plan: Flu vaccine HIGH DOSE PF  Essential hypertension  return precautions advised.  Garret Reddish, MD

## 2018-06-07 ENCOUNTER — Emergency Department (HOSPITAL_COMMUNITY): Payer: Medicare Other

## 2018-06-07 ENCOUNTER — Emergency Department (HOSPITAL_COMMUNITY)
Admission: EM | Admit: 2018-06-07 | Discharge: 2018-06-07 | Disposition: A | Discharge status: Home / Self Care | Payer: Medicare Other | Attending: Emergency Medicine | Admitting: Emergency Medicine

## 2018-06-07 DIAGNOSIS — S1093XA Contusion of unspecified part of neck, initial encounter: Secondary | ICD-10-CM | POA: Diagnosis not present

## 2018-06-07 DIAGNOSIS — Z8673 Personal history of transient ischemic attack (TIA), and cerebral infarction without residual deficits: Secondary | ICD-10-CM | POA: Diagnosis not present

## 2018-06-07 DIAGNOSIS — Z79899 Other long term (current) drug therapy: Secondary | ICD-10-CM | POA: Diagnosis not present

## 2018-06-07 DIAGNOSIS — R42 Dizziness and giddiness: Secondary | ICD-10-CM | POA: Diagnosis not present

## 2018-06-07 DIAGNOSIS — Y92008 Other place in unspecified non-institutional (private) residence as the place of occurrence of the external cause: Secondary | ICD-10-CM | POA: Insufficient documentation

## 2018-06-07 DIAGNOSIS — S80919A Unspecified superficial injury of unspecified knee, initial encounter: Secondary | ICD-10-CM | POA: Diagnosis not present

## 2018-06-07 DIAGNOSIS — T07XXXA Unspecified multiple injuries, initial encounter: Secondary | ICD-10-CM | POA: Diagnosis not present

## 2018-06-07 DIAGNOSIS — Y9301 Activity, walking, marching and hiking: Secondary | ICD-10-CM | POA: Insufficient documentation

## 2018-06-07 DIAGNOSIS — S0083XA Contusion of other part of head, initial encounter: Secondary | ICD-10-CM | POA: Diagnosis not present

## 2018-06-07 DIAGNOSIS — S0990XA Unspecified injury of head, initial encounter: Secondary | ICD-10-CM | POA: Diagnosis not present

## 2018-06-07 DIAGNOSIS — W010XXA Fall on same level from slipping, tripping and stumbling without subsequent striking against object, initial encounter: Secondary | ICD-10-CM | POA: Diagnosis not present

## 2018-06-07 DIAGNOSIS — I10 Essential (primary) hypertension: Secondary | ICD-10-CM | POA: Diagnosis not present

## 2018-06-07 DIAGNOSIS — S0003XA Contusion of scalp, initial encounter: Secondary | ICD-10-CM | POA: Diagnosis not present

## 2018-06-07 DIAGNOSIS — Y999 Unspecified external cause status: Secondary | ICD-10-CM | POA: Insufficient documentation

## 2018-06-07 LAB — BASIC METABOLIC PANEL
Anion gap: 9 (ref 5–15)
BUN: 22 mg/dL (ref 8–23)
CALCIUM: 9.6 mg/dL (ref 8.9–10.3)
CO2: 25 mmol/L (ref 22–32)
CREATININE: 0.9 mg/dL (ref 0.44–1.00)
Chloride: 108 mmol/L (ref 98–111)
GFR calc Af Amer: 60 mL/min — ABNORMAL LOW (ref >60)
GFR, EST NON AFRICAN AMERICAN: 52 mL/min — AB (ref >60)
GLUCOSE: 106 mg/dL — AB (ref 70–99)
POTASSIUM: 4.1 mmol/L (ref 3.5–5.1)
Sodium: 142 mmol/L (ref 135–145)

## 2018-06-07 LAB — URINALYSIS, ROUTINE W REFLEX MICROSCOPIC
Bilirubin Urine: NEGATIVE
Glucose, UA: NEGATIVE mg/dL
HGB URINE DIPSTICK: NEGATIVE
Ketones, ur: NEGATIVE mg/dL
Leukocytes, UA: NEGATIVE
NITRITE: NEGATIVE
Protein, ur: NEGATIVE mg/dL
SPECIFIC GRAVITY, URINE: 1.011 (ref 1.005–1.030)
pH: 8 (ref 5.0–8.0)

## 2018-06-07 LAB — CBC
HCT: 47.3 % — ABNORMAL HIGH (ref 36.0–46.0)
HEMOGLOBIN: 15.2 g/dL — AB (ref 12.0–15.0)
MCH: 30 pg (ref 26.0–34.0)
MCHC: 32.1 g/dL (ref 30.0–36.0)
MCV: 93.5 fL (ref 80.0–100.0)
Platelets: 151 10*3/uL (ref 150–400)
RBC: 5.06 MIL/uL (ref 3.87–5.11)
RDW: 14.2 % (ref 11.5–15.5)
WBC: 8.8 10*3/uL (ref 4.0–10.5)
nRBC: 0 % (ref 0.0–0.2)

## 2018-06-07 MED ORDER — ACETAMINOPHEN 325 MG PO TABS
650.0000 mg | ORAL_TABLET | Freq: Once | ORAL | Status: AC
  Administered 2018-06-07: 650 mg via ORAL
  Filled 2018-06-07: qty 2

## 2018-06-07 NOTE — Discharge Instructions (Addendum)
It was our pleasure to provide your ER care today - we hope that you feel better.  Fall precautions - take great care to avoid falling, use walker for added stability.  Take acetaminophen as need.   Return to ER if worse, new symptoms, new or severe pain, other concern.

## 2018-06-07 NOTE — ED Notes (Signed)
Discharge instructions discussed with Pt. Pt verbalized understanding. Pt stable and leaving via WC.    

## 2018-06-07 NOTE — ED Provider Notes (Signed)
[place] EMERGENCY DEPARTMENT Provider Note   CSN: [id number] Arrival date & time: 06/07/18  1214     History   Chief Complaint No chief complaint on file.   HPI [name] is a 84 y.o. female.  Patient s/p fall at home. Pt indicates there are rails to help her balance, but did not have a hold of it, and fell back, hit head. Contusion to scalp, pain to area, moderate, worse palpation. Denies loc. No neck or back pain. Denies nausea or vomiting. No chest pain or discomfort. No sob. No abd pain. Denies extremity pain or injury. Skin intact. No faintness or dizziness prior to fall.   The history is provided by the patient and the EMS personnel.    Past Medical History:  Diagnosis Date  . Hypertension   . Osteopenia   . Stroke [facility])     Patient Active Problem List   Diagnosis Date Noted  . Meningioma ([place]) 03/31/2018  . History of CVA in adulthood 03/31/2018  . Hyperlipidemia 03/15/2018  . Urinary incontinence 04/28/2017  . Reactive depression 08/14/2015  . Intention tremor 06/21/2015  . Cerebral amyloid angiopathy ([place]) 05/29/2015  . Atrial fibrillation ([place]) 03/29/2015  . History of intracerebral hemorrhage    . Dizzy spells 11/10/2014  . SPINAL STENOSIS, CERVICAL 06/11/2010  . Essential hypertension 06/22/2006  . Arthritis of right knee 06/22/2006  . Osteopenia 06/22/2006    Past Surgical History:  Procedure Laterality Date  . CATARACT EXTRACTION       OB History   None      Home Medications    Prior to Admission medications   Medication Sig Start Date End Date Taking? Authorizing Provider  amLODipine (NORVASC) 5 MG tablet Take 1 tablet (5 mg total) by mouth daily. 03/31/18   Marin Olp, MD  atorvastatin (LIPITOR) 40 MG tablet Take 1 tablet by mouth at bedtime. 03/17/18   [provider]  losartan (COZAAR) 100 MG tablet Take 1 tablet (100 mg total) by mouth daily. 01/04/18   Marin Olp, MD  Multiple Vitamin  (MULTIVITAMIN) tablet Take 1 tablet by mouth daily.      [provider]    Family History Family History  Problem Relation Age of Onset  . Cancer Daughter        breast    Social History Social History   Tobacco Use  . Smoking status: Never Smoker  . Smokeless tobacco: Never Used  Substance Use Topics  . Alcohol use: Yes    Alcohol/week: 1.0 standard drinks    Types: 1 Glasses of wine per week    Comment: 1 glass with dinner  . Drug use: No     Allergies   Ace inhibitors   Review of Systems Review of Systems  Constitutional: Negative for fever.  HENT: Negative for nosebleeds.   Eyes: Negative for visual disturbance.  Respiratory: Negative for shortness of breath.   Cardiovascular: Negative for chest pain and palpitations.  Gastrointestinal: Negative for abdominal pain, diarrhea and vomiting.  Genitourinary: Negative for dysuria and flank pain.  Musculoskeletal: Negative for back pain and neck pain.  Skin: Negative for wound.  Neurological: Negative for weakness and numbness.  Hematological: Does not bruise/bleed easily.  Psychiatric/Behavioral: Negative for confusion.     Physical Exam Updated Vital Signs BP (!) 157/88   Pulse 67   Temp (!) 97.5 F (36.4 C) (Oral)   Resp 18   Ht 1.524 m (5')   Wt  59 kg   SpO2 100%   BMI 25.39 kg/m   Physical Exam  Constitutional: She appears well-developed and well-nourished.  HENT:  Contusion posterior scalp.   Eyes: Pupils are equal, round, and reactive to light. Conjunctivae are normal. No scleral icterus.  Neck: Normal range of motion. Neck supple. No tracheal deviation present.  Cardiovascular: Normal rate, regular rhythm, normal heart sounds and intact distal pulses.  Pulmonary/Chest: Effort normal and breath sounds normal. No respiratory distress.  Abdominal: Soft. Normal appearance and bowel sounds are normal. She exhibits no distension. There is no tenderness.  Musculoskeletal: She exhibits no  edema.  CTLS spine, non tender, aligned, no step off. Good rom bil ext without pain or focal bony tenderness.   Neurological: She is alert.  Speech clear/fluent. Motor/sens grossly intact bil. Ambulates w assistance.   Skin: Skin is warm and dry. No rash noted.  Psychiatric: She has a normal mood and affect.  Nursing note and vitals reviewed.    ED Treatments / Results  Labs (all labs ordered are listed, but only abnormal results are displayed) Results for orders placed or performed during the hospital encounter of 17/49/44  Basic metabolic panel  Result Value Ref Range   Sodium 142 135 - 145 mmol/L   Potassium 4.1 3.5 - 5.1 mmol/L   Chloride 108 98 - 111 mmol/L   CO2 25 22 - 32 mmol/L   Glucose, Bld 106 (H) 70 - 99 mg/dL   BUN 22 8 - 23 mg/dL   Creatinine, Ser 0.90 0.44 - 1.00 mg/dL   Calcium 9.6 8.9 - 10.3 mg/dL   GFR calc non Af Amer 52 (L) >60 mL/min   GFR calc Af Amer 60 (L) >60 mL/min   Anion gap 9 5 - 15  CBC  Result Value Ref Range   WBC 8.8 4.0 - 10.5 K/uL   RBC 5.06 3.87 - 5.11 MIL/uL   Hemoglobin 15.2 (H) 12.0 - 15.0 g/dL   HCT 47.3 (H) 36.0 - 46.0 %   MCV 93.5 80.0 - 100.0 fL   MCH 30.0 26.0 - 34.0 pg   MCHC 32.1 30.0 - 36.0 g/dL   RDW 14.2 11.5 - 15.5 %   Platelets 151 150 - 400 K/uL   nRBC 0.0 0.0 - 0.2 %   Ct Head Wo Contrast  Result Date: 06/07/2018 CLINICAL DATA:  84 year old female tripped and hit head. Denies loss of consciousness. Initial encounter. EXAM: CT HEAD WITHOUT CONTRAST TECHNIQUE: Contiguous axial images were obtained from the base of the skull through the vertex without intravenous contrast. COMPARISON:  07/30/2015. FINDINGS: Brain: No intracranial hemorrhage or CT evidence of large acute infarct. Larger area of encephalomalacia anterior right temporal lobe probably related to remote infarct. Chronic microvascular changes. Global atrophy. Anterior right parafalcine meningioma measuring up to 1.2 cm unchanged. Vascular: No hyperdense vessel.   Atherosclerotic changes. Skull: No skull fracture Sinuses/Orbits: No acute orbital abnormality. Post lens replacement. Visualized paranasal sinuses clear. Mastoid air cells and middle ear cavities clear. Other: Ground-glass appearance upper nose/glabella unchanged. Right parietal scalp hematoma. Degenerative changes upper cervical spine. IMPRESSION: 1. Right parietal scalp hematoma without underlying fracture or intracranial hemorrhage. 2. Chronic changes as described above otherwise similar to prior exam. Electronically Signed   By: Genia Del M.D.   On: 06/07/2018 14:04    EKG None  Radiology Ct Head Wo Contrast  Result Date: 06/07/2018 CLINICAL DATA:  84 year old female tripped and hit head. Denies loss of consciousness. Initial encounter. EXAM: CT  HEAD WITHOUT CONTRAST TECHNIQUE: Contiguous axial images were obtained from the base of the skull through the vertex without intravenous contrast. COMPARISON:  07/30/2015. FINDINGS: Brain: No intracranial hemorrhage or CT evidence of large acute infarct. Larger area of encephalomalacia anterior right temporal lobe probably related to remote infarct. Chronic microvascular changes. Global atrophy. Anterior right parafalcine meningioma measuring up to 1.2 cm unchanged. Vascular: No hyperdense vessel.  Atherosclerotic changes. Skull: No skull fracture Sinuses/Orbits: No acute orbital abnormality. Post lens replacement. Visualized paranasal sinuses clear. Mastoid air cells and middle ear cavities clear. Other: Ground-glass appearance upper nose/glabella unchanged. Right parietal scalp hematoma. Degenerative changes upper cervical spine. IMPRESSION: 1. Right parietal scalp hematoma without underlying fracture or intracranial hemorrhage. 2. Chronic changes as described above otherwise similar to prior exam. Electronically Signed   By: Genia Del M.D.   On: 06/07/2018 14:04    Procedures Procedures (including critical care time)  Medications Ordered in  ED Medications - No data to display   Initial Impression / Assessment and Plan / ED Course  I have reviewed the triage vital signs and the nursing notes.  Pertinent labs & imaging results that were available during my care of the patient were reviewed by me and considered in my medical decision making (see chart for details).  Labs. Imaging.  Reviewed nursing notes and prior charts for additional history.   Acetaminophen po. icepack to scalp hematoma.   Ambulate in hall.  Po fluids.  No new c/o. No distress.  Pt currently appears stable for d/c.     Final Clinical Impressions(s) / ED Diagnoses   Final diagnoses:  None    ED Discharge Orders    None       Lajean Saver, MD 06/07/18 1426

## 2018-06-07 NOTE — ED Notes (Signed)
Pt ambulated self efficiently to restroom. Pt ambulated safely back to bedside.

## 2018-06-07 NOTE — ED Notes (Signed)
Patient transported to CT 

## 2018-06-07 NOTE — ED Notes (Signed)
Ice applied to back of head.

## 2018-06-07 NOTE — ED Triage Notes (Addendum)
Pt here via [facility] from home, was outside on patio when she tripped backwards and hit her head, denies LOC, not on blood thinner but does have a history of a brain bleed. C/o pain in head, left arm and right knee. A&O x4. 138/79, 78 P, 98% RA, 16 RR. Towel C-Collar present.

## 2018-06-13 ENCOUNTER — Telehealth: Payer: Self-pay | Admitting: Family Medicine

## 2018-06-13 NOTE — Telephone Encounter (Signed)
See note  Copied from [place] [id number]. Topic: General - Other >> Jun 13, 2018 10:47 AM Ivar Drape wrote: Reason for CRM:   Patient would like a return call from [facility] concerning her headaches from her fall.

## 2018-06-15 ENCOUNTER — Other Ambulatory Visit: Payer: Self-pay

## 2018-06-15 ENCOUNTER — Ambulatory Visit: Payer: Self-pay | Admitting: *Deleted

## 2018-06-15 ENCOUNTER — Encounter (INDEPENDENT_AMBULATORY_CARE_PROVIDER_SITE_OTHER): Payer: Self-pay

## 2018-06-15 ENCOUNTER — Ambulatory Visit (INDEPENDENT_AMBULATORY_CARE_PROVIDER_SITE_OTHER)
Admission: RE | Admit: 2018-06-15 | Discharge: 2018-06-15 | Disposition: A | Discharge status: Home / Self Care | Payer: Medicare Other | Source: Ambulatory Visit | Attending: Family Medicine | Admitting: Family Medicine

## 2018-06-15 ENCOUNTER — Telehealth: Payer: Self-pay

## 2018-06-15 DIAGNOSIS — G44309 Post-traumatic headache, unspecified, not intractable: Secondary | ICD-10-CM | POA: Diagnosis not present

## 2018-06-15 DIAGNOSIS — G44319 Acute post-traumatic headache, not intractable: Secondary | ICD-10-CM

## 2018-06-15 NOTE — Telephone Encounter (Signed)
See note

## 2018-06-15 NOTE — Telephone Encounter (Signed)
Stat CT ordered.   Patient not able to come until Friday (didn't want to see another provider today)- should go to emergency room if worsening symptoms or can call back tomorrow and we can try to work in.   Will hold off on scheduling with Dr. Tamala Julian until after [facility] evaluated though I appreciate him being willing to see the patient

## 2018-06-15 NOTE — Telephone Encounter (Signed)
Pt called with complaints of headache; she was seen in the ED on 06/07/18 and has been taking tylenol for pain; she says that at times she does not feel clear; the pt says that she called the office on 06/13/18 but no one has called back and her headache continues; recommendations made per nurse triage protocol; the pt will call back because she has to see when her caregiver can bring her; also spoke with Watt Climes and Madison at [facility] to see if Dr Yong Channel had a chance to review pt's chart; Madison requested that this information be forwarded to the office for notification and review.   Reason for Disposition . [1] After 72 hours AND [2] headache persists  Answer Assessment - Initial Assessment Questions 1. MECHANISM: "How did the injury happen?" For falls, ask: "What height did you fall from?" and "What surface did you fall against?"      Pt fell on 06/07/18 2. ONSET: "When did the injury happen?" (Minutes or hours ago)      Days 06/07/18 3. NEUROLOGIC SYMPTOMS: "Was there any loss of consciousness?" "Are there any other neurological symptoms?"     Pt says that she doesn't feel clear at times 4. MENTAL STATUS: "Does the person know who he is, who you are, and where he is?"      yes 5. LOCATION: "What part of the head was hit?"      Back of head 6. SCALP APPEARANCE: "What does the scalp look like? Is it bleeding now?" If so, ask: "Is it difficult to stop?"      Pt had a knot 7. SIZE: For cuts, bruises, or swelling, ask: "How large is it?" (e.g., inches or centimeters)      Unable to measure 8. PAIN: "Is there any pain?" If so, ask: "How bad is it?"  (e.g., Scale 1-10; or mild, moderate, severe)     mile 9. TETANUS: For any breaks in the skin, ask: "When was the last tetanus booster?"      10. OTHER SYMPTOMS: "Do you have any other symptoms?" (e.g., neck pain, vomiting)       no 11. PREGNANCY: "Is there any chance you are pregnant?" "When was your last menstrual  period?" no  Protocols used: HEAD INJURY-A-AH

## 2018-06-15 NOTE — Telephone Encounter (Signed)
Spoke with patient's caregiver. Per a verbal from Dr. Tamala Julian, he would like for patient to be seen by Dr. Yong Channel. Dr. Tamala Julian has been in contact via phone with Dr. Yong Channel about patient. Caregiver states that they have appointment scheduled with Dr. Yong Channel on Friday. Told that she will not need appointment with [place] at this time and for Dr. Yong Channel to continue her care. Caregiver voices understanding.

## 2018-06-15 NOTE — Telephone Encounter (Signed)
Patient is scheduled for CT today at 3:10pm.

## 2018-06-15 NOTE — Telephone Encounter (Signed)
Spoke with Mateo Flow at the [facility]; reviewed pt info with Mateo Flow; she states that she will follow up with the pt once she has spoken with Dr Tamala Julian; the pt can be contacted at [phone].

## 2018-06-15 NOTE — Telephone Encounter (Signed)
Amber returning call and spoke with [facility] agent Angie and wanted to schedule an appt for the pt because she would be providing transportation for the pt to get to the appt. Per previous notes Amber notified that the staff at the concussion clinic would contact her to make an appt. If an appt is not available at the concussion clinic for the pt to be seen soon than an appt would could be scheduled with Dr. Yong Channel on tomorrow. Amber can be contacted at [phone].

## 2018-06-15 NOTE — Telephone Encounter (Signed)
Stat referral placed for head CT. Patient scheduled for Friday morning. I offered today or tomorrow. They can't come today and her husband has a hearing appointment tomorrow. I called daughter Fredrich Birks (who is on the [facility]) and left a message asking for a return phone call. Patient is aware to look for CT to call to schedule an appointment and Concussion clinic

## 2018-06-15 NOTE — Telephone Encounter (Signed)
Concussion clinic is reasonable. I also am going to ask team to order repeat head ct without contrast under headache unspecified and fall to rule out delayed bleed. If they cannot see her soon- can we find a time for [place] to see her in clinic tomorrow? Or would she want to see one of other providers today? im not here this PM

## 2018-06-15 NOTE — Telephone Encounter (Addendum)
Patient called back and nurse triage was started and information entered  in that.

## 2018-06-17 ENCOUNTER — Encounter: Payer: Self-pay | Admitting: Family Medicine

## 2018-06-17 ENCOUNTER — Ambulatory Visit (INDEPENDENT_AMBULATORY_CARE_PROVIDER_SITE_OTHER): Payer: Medicare Other | Admitting: Family Medicine

## 2018-06-17 VITALS — BP 136/72 | HR 62 | Temp 97.7°F | Ht 60.0 in | Wt 127.2 lb

## 2018-06-17 DIAGNOSIS — I10 Essential (primary) hypertension: Secondary | ICD-10-CM | POA: Diagnosis not present

## 2018-06-17 DIAGNOSIS — S060X0A Concussion without loss of consciousness, initial encounter: Secondary | ICD-10-CM

## 2018-06-17 NOTE — Assessment & Plan Note (Signed)
S: controlled on losartan 100 mg, amlodipine 5 mg.  Fortunately edema has been manageable  Home numbers remain controlled sometimes even 120s but typically 130s over 70s or 80s  BP Readings from Last 3 Encounters:  06/17/18 (!) 142/62  06/07/18 (!) 153/82  05/02/18 138/62  A/P: We discussed blood pressure goal of <145/90- slightly looser goal given fall risk but on other hand with less BP medication had stroke. Continue current meds

## 2018-06-17 NOTE — Patient Instructions (Addendum)
I am impressed you have healed as quickly as you have and are doing as well as you are  Im glad home blood pressures still look good  Please avoid taking out the trash since that seems to be a trouble spot for you.   Try to avoid taking the evening tylenol unless you are having headaches as regularly taking tylenol can cause rebound headaches. Im ok for you to take it in the morning if needed still. Hopefully these will go away within a few weeks. May have had a mild concussion given some confusion at first and lingering headaches but I think that is improving

## 2018-06-17 NOTE — Progress Notes (Signed)
Subjective:  [name] is a 84 y.o. year old very pleasant female patient who presents for/with See problem oriented charting ROS- increased stress, headaches each morning. No chest pain or shortness of breath. No fever or chills.    Past Medical History-  Patient Active Problem List   Diagnosis Date Noted  . Meningioma ([place]) 03/31/2018    Priority: High  . History of CVA in adulthood 03/31/2018    Priority: High  . Atrial fibrillation ([place]) 03/29/2015    Priority: High  . History of intracerebral hemorrhage      Priority: High  . Hyperlipidemia 03/15/2018    Priority: Medium  . Essential hypertension 06/22/2006    Priority: Medium  . Dizzy spells 11/10/2014    Priority: Low  . SPINAL STENOSIS, CERVICAL 06/11/2010    Priority: Low  . Arthritis of right knee 06/22/2006    Priority: Low  . Osteopenia 06/22/2006    Priority: Low  . Urinary incontinence 04/28/2017  . Reactive depression 08/14/2015  . Intention tremor 06/21/2015  . Cerebral amyloid angiopathy ([place]) 05/29/2015    Medications- reviewed and updated Current Outpatient Medications  Medication Sig Dispense Refill  . amLODipine (NORVASC) 5 MG tablet Take 1 tablet (5 mg total) by mouth daily. 90 tablet 3  . atorvastatin (LIPITOR) 40 MG tablet Take 1 tablet by mouth at bedtime.    Marland Kitchen losartan (COZAAR) 100 MG tablet Take 1 tablet (100 mg total) by mouth daily. 90 tablet 1  . Multiple Vitamin (MULTIVITAMIN) tablet Take 1 tablet by mouth daily.       No current facility-administered medications for this visit.     Objective: BP 136/72   Pulse 62   Temp 97.7 F (36.5 C) (Oral)   Ht 5' (1.524 m)   Wt 127 lb 3.2 oz (57.7 kg)   SpO2 98%   BMI 24.84 kg/m  Gen: NAD, resting comfortably CV: RRR no murmurs rubs or gallops Lungs: CTAB no crackles, wheeze, rhonchi Abdomen: soft/nontender/nondistended/normal bowel sounds. No rebound or guarding.  Ext: no edema Skin: warm, dry Neuro: CN II-XII intact, sensation  and reflexes normal throughout, 5/5 muscle strength in bilateral upper and lower extremities. Normal finger to nose.  No pronator drift. Baseline gait- walks with assistance of caregiver  Assessment/Plan:  Concussion without loss of consciousness, initial encounter S: Patient was walking outside to take the trash out.  There is a railing there to assist her but she did not grab onto it.  She slipped backwards when trying to get the trash into the trash can.  Hit the back of her scalp.  Did not lose consciousness.  Screamed for help.  She was luckily able to pull herself up using the rail and get her husband and daughter's attention.  She was transported to the emergency room where she had a CT scan which showed no acute intra-cranial abnormality.  Stable meningioma was noted.  Patient called back on Monday morning complaining of headaches from fall.  The patient complained of not feeling clear/mild confusion on triage call.  Due to concern for potential delayed bleed stat CT scan was ordered.  Due to availability concerns patient was not able to be seen until today which is 4 days later from initial call.  Patient states she is dealing with some Mild confusion above baseline. Caregiver that is with her 3 days a week has not noted difference. Patient then reflects and thinks she is just more stressed after her fall  Patient does wake  up with a 1-3 out of 10 headache each morning since the fall.  This resolves with Tylenol.  She takes a Tylenol at night x2 to make sure she does not wake up with a headache though she is not sure if she would if she did not take medication. A/P: Reassuring imaging with CT head noncontrast at time of fall and then a week later.  I suspect patient has a mild concussion.  In addition I think she has increased stress as she does not like to add to the burden of her family who is trying to prepare for her granddaughter's wedding.  Extended counseling needed today to reassure  patient and comfort her over her distress  I did discuss potential for rebound headaches and I would avoid using Tylenol on preventative measure before bed.  She agrees to stop this nighttime dose.  She will return to care if she has a worsening headache pattern or worsening confusion.  Discussed my hope is that these things improve in the next 2 to 3 weeks-headaches, mild increased confusion  Essential hypertension S: controlled on losartan 100 mg, amlodipine 5 mg.  Fortunately edema has been manageable  Home numbers remain controlled sometimes even 120s but typically 130s over 70s or 80s  BP Readings from Last 3 Encounters:  06/17/18 (!) 142/62  06/07/18 (!) 153/82  05/02/18 138/62  A/P: We discussed blood pressure goal of <145/90- slightly looser goal given fall risk but on other hand with less BP medication had stroke. Continue current meds  Future Appointments  Date Time Provider [place]  08/01/2018 11:15 AM Marin Olp, MD [facility]   Time Stamp The duration of face-to-face time during this visit was greater than 25 minutes. Greater than 50% of this time was spent in counseling, explanation of diagnosis, planning of further management, and/or coordination of care including comforting patient after this traumatic incident, reassuring her that the most important thing is that she gets care when she needs it, advising follow-up if symptoms worsening or not improving or other new symptoms, discussing home monitoring of blood pressure and goals.    Return precautions advised.  Garret Reddish, MD

## 2018-06-20 ENCOUNTER — Other Ambulatory Visit: Payer: Self-pay | Admitting: Family Medicine

## 2018-06-20 MED ORDER — ATORVASTATIN CALCIUM 40 MG PO TABS: 40.0000 mg | ORAL_TABLET | Freq: Every day | ORAL | 2 refills | Status: DC

## 2018-06-20 NOTE — Telephone Encounter (Signed)
Requested medication (s) are due for refill today -yes  Requested medication (s) are on the active medication list -yes  Future visit scheduled -yes  Last refill: 03/17/18  Notes to clinic: Patient states this Rx was started at hospital. This is a historical Rx- not prescribed by provider will need review by PCP before fill.  Requested Prescriptions  Pending Prescriptions Disp Refills   atorvastatin (LIPITOR) 40 MG tablet      Sig: Take 1 tablet (40 mg total) by mouth at bedtime.     Cardiovascular:  Antilipid - Statins Passed - 06/20/2018  9:07 AM      Passed - Total Cholesterol in normal range and within 360 days    Cholesterol  Date Value Ref Range Status  05/02/2018 134 0 - 200 mg/dL Final    Comment:    ATP III Classification       Desirable:  < 200 mg/dL               Borderline High:  200 - 239 mg/dL          High:  > = 240 mg/dL         Passed - LDL in normal range and within 360 days    LDL Cholesterol  Date Value Ref Range Status  05/02/2018 47 0 - 99 mg/dL Final         Passed - HDL in normal range and within 360 days    HDL  Date Value Ref Range Status  05/02/2018 66.30 >39.00 mg/dL Final         Passed - Triglycerides in normal range and within 360 days    Triglycerides  Date Value Ref Range Status  05/02/2018 105.0 0.0 - 149.0 mg/dL Final    Comment:    Normal:  <150 mg/dLBorderline High:  150 - 199 mg/dL         Passed - Patient is not pregnant      Passed - Valid encounter within last 12 months    Recent Outpatient Visits          3 days ago Concussion without loss of consciousness, initial encounter   [place] [facility], Brayton Mars, MD   1 month ago Hyperlipidemia, unspecified hyperlipidemia type   [facility] [place], Brayton Mars, MD   2 months ago Essential hypertension   [facility] [place], Brayton Mars, MD   2 months ago History of CVA in adulthood   [place], Brayton Mars, MD   3 months ago Essential hypertension   [place], MD      Future Appointments            In 1 month Hunter, Brayton Mars, MD [facility] [place], [facility]            Requested Prescriptions  Pending Prescriptions Disp Refills   atorvastatin (LIPITOR) 40 MG tablet      Sig: Take 1 tablet (40 mg total) by mouth at bedtime.     Cardiovascular:  Antilipid - Statins Passed - 06/20/2018  9:07 AM      Passed - Total Cholesterol in normal range and within 360 days    Cholesterol  Date Value Ref Range Status  05/02/2018 134 0 - 200 mg/dL Final    Comment:    ATP III Classification       Desirable:  < 200 mg/dL  Borderline High:  200 - 239 mg/dL          High:  > = 240 mg/dL         Passed - LDL in normal range and within 360 days    LDL Cholesterol  Date Value Ref Range Status  05/02/2018 47 0 - 99 mg/dL Final         Passed - HDL in normal range and within 360 days    HDL  Date Value Ref Range Status  05/02/2018 66.30 >39.00 mg/dL Final         Passed - Triglycerides in normal range and within 360 days    Triglycerides  Date Value Ref Range Status  05/02/2018 105.0 0.0 - 149.0 mg/dL Final    Comment:    Normal:  <150 mg/dLBorderline High:  150 - 199 mg/dL         Passed - Patient is not pregnant      Passed - Valid encounter within last 12 months    Recent Outpatient Visits          3 days ago Concussion without loss of consciousness, initial encounter   [place] [facility], Brayton Mars, MD   1 month ago Hyperlipidemia, unspecified hyperlipidemia type   [facility] [place], Brayton Mars, MD   2 months ago Essential hypertension   [place] [facility], Brayton Mars, MD   2 months ago History of CVA in adulthood   [place], MD   3 months ago Essential hypertension    [place], Brayton Mars, MD      Future Appointments            In 1 month Hunter, Brayton Mars, MD [place], [place]

## 2018-06-20 NOTE — Telephone Encounter (Signed)
Copied from [id number]. Topic: Quick Communication - Rx Refill/Question >> Jun 20, 2018  8:58 AM Reyne Dumas L wrote: Medication: atorvastatin (LIPITOR) 40 MG tablet  Has the patient contacted their pharmacy? Yes - states this was given by ER and they are having trouble getting it refilled.  Pt doesn't have any left.  Pt's daughter wants to know what will happen if she misses a dose. (Agent: If no, request that the patient contact the pharmacy for the refill.) (Agent: If yes, when and what did the pharmacy advise?)  Preferred Pharmacy (with phone number or street name): [id number] - [place], [place] - [address] [phone] (Phone) [phone] (Fax)  Agent: Please be advised that RX refills may take up to 3 business days. We ask that you follow-up with your pharmacy.

## 2018-06-20 NOTE — Telephone Encounter (Signed)
Caregiver calling to advise she was instructed to call because pt is completely out of this med. atorvastatin (LIPITOR) 40 MG tablet

## 2018-06-20 NOTE — Telephone Encounter (Signed)
See note

## 2018-07-20 ENCOUNTER — Emergency Department (HOSPITAL_BASED_OUTPATIENT_CLINIC_OR_DEPARTMENT_OTHER)
Admission: EM | Admit: 2018-07-20 | Discharge: 2018-07-20 | Disposition: A | Discharge status: Home / Self Care | Payer: Medicare Other | Attending: Emergency Medicine | Admitting: Emergency Medicine

## 2018-07-20 ENCOUNTER — Encounter (HOSPITAL_BASED_OUTPATIENT_CLINIC_OR_DEPARTMENT_OTHER): Payer: Self-pay | Admitting: Emergency Medicine

## 2018-07-20 ENCOUNTER — Other Ambulatory Visit: Payer: Self-pay

## 2018-07-20 DIAGNOSIS — Y9389 Activity, other specified: Secondary | ICD-10-CM | POA: Insufficient documentation

## 2018-07-20 DIAGNOSIS — S61211A Laceration without foreign body of left index finger without damage to nail, initial encounter: Secondary | ICD-10-CM | POA: Diagnosis not present

## 2018-07-20 DIAGNOSIS — Z5321 Procedure and treatment not carried out due to patient leaving prior to being seen by health care provider: Secondary | ICD-10-CM | POA: Insufficient documentation

## 2018-07-20 DIAGNOSIS — X58XXXA Exposure to other specified factors, initial encounter: Secondary | ICD-10-CM | POA: Diagnosis not present

## 2018-07-20 DIAGNOSIS — Y999 Unspecified external cause status: Secondary | ICD-10-CM | POA: Insufficient documentation

## 2018-07-20 DIAGNOSIS — Y929 Unspecified place or not applicable: Secondary | ICD-10-CM | POA: Insufficient documentation

## 2018-07-20 NOTE — ED Triage Notes (Signed)
Pt having a left index finger laceration since today at 11 am. Dressing applied by family no bleeding noticed, per family will like to have wound evaluated.

## 2018-07-21 DIAGNOSIS — S61201A Unspecified open wound of left index finger without damage to nail, initial encounter: Secondary | ICD-10-CM | POA: Diagnosis not present

## 2018-08-01 ENCOUNTER — Encounter: Payer: Self-pay | Admitting: Family Medicine

## 2018-08-01 ENCOUNTER — Ambulatory Visit (INDEPENDENT_AMBULATORY_CARE_PROVIDER_SITE_OTHER): Payer: Medicare Other | Admitting: Family Medicine

## 2018-08-01 VITALS — BP 132/84 | HR 76 | Temp 97.3°F | Ht 60.0 in | Wt 127.0 lb

## 2018-08-01 DIAGNOSIS — E785 Hyperlipidemia, unspecified: Secondary | ICD-10-CM

## 2018-08-01 DIAGNOSIS — N183 Chronic kidney disease, stage 3 unspecified: Secondary | ICD-10-CM

## 2018-08-01 DIAGNOSIS — N1831 Chronic kidney disease, stage 3a: Secondary | ICD-10-CM | POA: Insufficient documentation

## 2018-08-01 DIAGNOSIS — I10 Essential (primary) hypertension: Secondary | ICD-10-CM | POA: Diagnosis not present

## 2018-08-01 NOTE — Assessment & Plan Note (Signed)
S: well controlled on atorvastatin 40mg  Lab Results  Component Value Date   CHOL 134 05/02/2018   HDL 66.30 05/02/2018   LDLCALC 47 05/02/2018   LDLDIRECT 99.0 04/28/2017   TRIG 105.0 05/02/2018   CHOLHDL 2 05/02/2018   A/P: stable, continue current medications

## 2018-08-01 NOTE — Assessment & Plan Note (Signed)
S: GFR in 50s for several visits. Knows to avoid nsaids.  A/P: Blood pressure reasonably controlled.  Lipids reasonably controlled.  Continue to modify risk factor.  Discussed she can use Tylenol as needed but to avoid NSAIDs-also makes more sense given her intracerebral hemorrhage history

## 2018-08-01 NOTE — Assessment & Plan Note (Signed)
S: controlled on amlodipine 5 mg and losartan 100mg . Checks every few days and blood pressures range from 795-583/16-74 with average certainly  In 255K/58F or lower BP Readings from Last 3 Encounters:  08/01/18 132/84  07/20/18 (!) 167/64  06/17/18 136/72  A/P: We discussed blood pressure goal of <140/90. Continue current meds

## 2018-08-01 NOTE — Progress Notes (Signed)
Subjective:  [name] is a 84 y.o. year old very pleasant female patient who presents for/with See problem oriented charting ROS- some mild left sided aching not exertional- where she was holding on when she fell some time back. Dealing with fatigue. No swelling in legs. Doesn't always feel mentally clear until gets going in the morning.    Past Medical History-  Patient Active Problem List   Diagnosis Date Noted  . Meningioma ([place]) 03/31/2018    Priority: High  . History of CVA in adulthood 03/31/2018    Priority: High  . Atrial fibrillation ([place]) 03/29/2015    Priority: High  . History of intracerebral hemorrhage      Priority: High  . Hyperlipidemia 03/15/2018    Priority: Medium  . Essential hypertension 06/22/2006    Priority: Medium  . Dizzy spells 11/10/2014    Priority: Low  . SPINAL STENOSIS, CERVICAL 06/11/2010    Priority: Low  . Arthritis of right knee 06/22/2006    Priority: Low  . Osteopenia 06/22/2006    Priority: Low  . Chronic kidney disease, stage III (moderate) ([place]) 08/01/2018  . Urinary incontinence 04/28/2017  . Reactive depression 08/14/2015  . Intention tremor 06/21/2015  . Cerebral amyloid angiopathy ([place]) 05/29/2015    Medications- reviewed and updated Current Outpatient Medications  Medication Sig Dispense Refill  . amLODipine (NORVASC) 5 MG tablet Take 1 tablet (5 mg total) by mouth daily. 90 tablet 3  . atorvastatin (LIPITOR) 40 MG tablet Take 1 tablet (40 mg total) by mouth at bedtime. 90 tablet 2  . losartan (COZAAR) 100 MG tablet TAKE 1 TABLET BY MOUTH EVERY DAY 90 tablet 1  . Multiple Vitamin (MULTIVITAMIN) tablet Take 1 tablet by mouth daily.       No current facility-administered medications for this visit.     Objective: BP 132/84 (BP Location: Left Arm, Patient Position: Sitting, Cuff Size: Large)   Pulse 76   Temp (!) 97.3 F (36.3 C) (Oral)   Ht 5' (1.524 m)   Wt 127 lb (57.6 kg)   SpO2 97%   BMI 24.80 kg/m  Gen:  NAD, resting comfortably, appears younger than stated age CV: RRR no murmurs rubs or gallops Lungs: CTAB no crackles, wheeze, rhonchi Abdomen: soft/nontender/nondistended/normal bowel sounds.  Ext: no edema Skin: warm, dry  Assessment/Plan:  Other notes: 1.a little blue time for her- lost fathre on christmas Eve plus she feels cant do all her cooking and preparation because of recent falls/injuries 2. New puppy in house good for her daughter but its been a lot of work  3. Cut on finger recently- luckily up to date on Td in 2016  Hyperlipidemia S: well controlled on atorvastatin 40mg  Lab Results  Component Value Date   CHOL 134 05/02/2018   HDL 66.30 05/02/2018   LDLCALC 47 05/02/2018   LDLDIRECT 99.0 04/28/2017   TRIG 105.0 05/02/2018   CHOLHDL 2 05/02/2018   A/P: stable, continue current medications  Essential hypertension S: controlled on amlodipine 5 mg and losartan 100mg . Checks every few days and blood pressures range from 569-794/80-16 with average certainly  In 553Z/48O or lower BP Readings from Last 3 Encounters:  08/01/18 132/84  07/20/18 (!) 167/64  06/17/18 136/72  A/P: We discussed blood pressure goal of <140/90. Continue current meds  Chronic kidney disease, stage III (moderate) (HCC) S: GFR in 50s for several visits. Knows to avoid nsaids.  A/P: Blood pressure reasonably controlled.  Lipids reasonably controlled.  Continue to modify  risk factor.  Discussed she can use Tylenol as needed but to avoid NSAIDs-also makes more sense given her intracerebral hemorrhage history  Future Appointments  Date Time Provider [place]  10/31/2018 11:00 AM Marin Olp, MD [facility]  She scheduled 23-month follow-up before she left today  Lab/Order associations: Hyperlipidemia, unspecified hyperlipidemia type  Essential hypertension  CKD (chronic kidney disease), stage III ([place])  Chronic kidney disease, stage III (moderate) ([place])  Return precautions  advised.  Garret Reddish, MD

## 2018-08-01 NOTE — Patient Instructions (Addendum)
Blood pressure looks good at home  No changes in your medications today  Lets follow up in 3 months or sooner if you need [place]

## 2018-10-25 ENCOUNTER — Other Ambulatory Visit: Payer: Self-pay | Admitting: Family Medicine

## 2018-10-31 ENCOUNTER — Encounter: Payer: Self-pay | Admitting: Family Medicine

## 2018-10-31 ENCOUNTER — Ambulatory Visit (INDEPENDENT_AMBULATORY_CARE_PROVIDER_SITE_OTHER): Payer: Medicare Other | Admitting: Family Medicine

## 2018-10-31 VITALS — BP 130/70 | HR 64 | Temp 97.9°F | Ht 60.0 in | Wt 126.6 lb

## 2018-10-31 DIAGNOSIS — I10 Essential (primary) hypertension: Secondary | ICD-10-CM | POA: Diagnosis not present

## 2018-10-31 DIAGNOSIS — I4891 Unspecified atrial fibrillation: Secondary | ICD-10-CM

## 2018-10-31 DIAGNOSIS — E785 Hyperlipidemia, unspecified: Secondary | ICD-10-CM

## 2018-10-31 NOTE — Patient Instructions (Addendum)
No changes today  Blood pressure looks great today

## 2018-10-31 NOTE — Progress Notes (Signed)
Phone [phone]   Subjective:  [name] is a 85 y.o. year old very pleasant female patient who presents for/with See problem oriented charting ROS-minimal issues with speech after stroke, no chest pain or shortness of breath reported.  No edema.  Past Medical History-  Patient Active Problem List   Diagnosis Date Noted  . Meningioma ([place]) 03/31/2018    Priority: High  . History of CVA in adulthood 03/31/2018    Priority: High  . Cerebral amyloid angiopathy ([place]) 05/29/2015    Priority: High  . Atrial fibrillation ([place]) 03/29/2015    Priority: High  . History of intracerebral hemorrhage      Priority: High  . Chronic kidney disease, stage III (moderate) (HCC) 08/01/2018    Priority: Medium  . Hyperlipidemia 03/15/2018    Priority: Medium  . Reactive depression 08/14/2015    Priority: Medium  . Essential hypertension 06/22/2006    Priority: Medium  . Urinary incontinence 04/28/2017    Priority: Low  . Dizzy spells 11/10/2014    Priority: Low  . SPINAL STENOSIS, CERVICAL 06/11/2010    Priority: Low  . Arthritis of right knee 06/22/2006    Priority: Low  . Osteopenia 06/22/2006    Priority: Low  . Intention tremor 06/21/2015    Medications- reviewed and updated Current Outpatient Medications  Medication Sig Dispense Refill  . amLODipine (NORVASC) 5 MG tablet Take 1 tablet (5 mg total) by mouth daily. 90 tablet 3  . atorvastatin (LIPITOR) 40 MG tablet Take 1 tablet (40 mg total) by mouth at bedtime. 90 tablet 2  . losartan (COZAAR) 100 MG tablet TAKE 1 TABLET BY MOUTH EVERY DAY 90 tablet 1  . Multiple Vitamin (MULTIVITAMIN) tablet Take 1 tablet by mouth daily.       No current facility-administered medications for this visit.      Objective:  BP 130/70 (BP Location: Left Arm, Patient Position: Sitting, Cuff Size: Normal)   Pulse 64   Temp 97.9 F (36.6 C) (Oral)   Ht 5' (1.524 m)   Wt 126 lb 9.6 oz (57.4 kg)   SpO2 94%   BMI 24.72 kg/m  Gen: NAD,  resting comfortably, appears younger than stated age CV: Irregularly  irregular no murmurs rubs or gallops Lungs: CTAB no crackles, wheeze, rhonchi Abdomen: soft/nontender/nondistended/normal bowel sounds. No rebound or guarding.  Ext: no edema Skin: warm, dry, no rash    Assessment and Plan   #Atrial fibrillation/history of intracerebral hemorrhage on aspirin in 2016.  Later with stroke in July 2019-was then stressful.  And blood pressure was higher.  Some concern with atrial fibrillation that that may have been the cause/embolic.  No anticoagulation due to history of intracerebral hemorrhage S: Lingering issues after strokes for patient include mild dysarthria primarily.  Overall doing well In regards to A. fib-anticoagulated with no medication due to risks.  Rate controlled without medication. A/P:  Stable in regards to A. fib, history of stroke x2- 1 intracerebral hemorrhage therefore not on anticoagulant-continue current medications-primarily blood pressure and lipid control   #Hyperlipidemia S: Compliant with atorvastatin 40 mg.  Started after stroke in July 2019 though may have been embolic. A/P:  Stable. Continue current medications.  Repeat lipids after September   #HypertensionS: Compliant with losartan 100 mg, amlodipine 5 mg.  History of edema on higher dose amlodipine.  Patient brings copy of blood pressure numbers since January visit-overall average is less than 140/90.  No diastolic numbers over 84.  Lowest systolic 433 and  peak 148. A/P: Doing well overall- Stable. Continue current medications.     Other notes: 1.Some pain in the abdomen at night-has to go to the bathroom quickly but this relieves discomfort.  Please frequently at night-tends to feel like she needs to drink a lot of water after her nighttime medications.  Discussed altering medications but she ultimately declined.    Future Appointments  Date Time Provider [place]  02/01/2019 11:00 AM Marin Olp, MD [facility]   Return in about 3 months (around 01/31/2019).  Lab/Order associations: Atrial fibrillation, unspecified type ([place])  Hyperlipidemia, unspecified hyperlipidemia type  Essential hypertension   Return precautions advised.  Garret Reddish, MD

## 2018-12-11 ENCOUNTER — Other Ambulatory Visit: Payer: Self-pay | Admitting: Family Medicine

## 2019-01-25 ENCOUNTER — Ambulatory Visit: Payer: Self-pay

## 2019-01-25 NOTE — Telephone Encounter (Signed)
See note, Patient is scheduled for tomorrow due to only requesting Dr. Yong Channel. [name] is aware.We attempted to contact Dr. Yong Channel however, he was on a virtual call. Please advise

## 2019-01-25 NOTE — Telephone Encounter (Signed)
Yes thanks- make sure no covid 19 symptoms if not already done

## 2019-01-25 NOTE — Telephone Encounter (Signed)
F.Y.I  I called patient and she informed me that her daughter did not want her out of the house.  She will have a virtual visit.

## 2019-01-25 NOTE — Telephone Encounter (Signed)
Patient's daughter [name] called and says her mother has been having off and on abdominal pain. She put the patient on the phone and the patient says the pain is mostly on the right side, upper, not all the time, today it's a mild pain. She has not taken anything for the pain and denies any other symptoms. I called the office and spoke to [place], [facility] who asked to speak to the patient and her daughter, the call was connected successfully.  Answer Assessment - Initial Assessment Questions 1. LOCATION: "Where does it hurt?"      More on the right side upper 2. RADIATION: "Does the pain shoot anywhere else?" (e.g., chest, back)     No 3. ONSET: "When did the pain begin?" (e.g., minutes, hours or days ago)      A few weeks 4. SUDDEN: "Gradual or sudden onset?"     Gradual 5. PATTERN "Does the pain come and go, or is it constant?"    - If constant: "Is it getting better, staying the same, or worsening?"      (Note: Constant means the pain never goes away completely; most serious pain is constant and it progresses)     - If intermittent: "How long does it last?" "Do you have pain now?"     (Note: Intermittent means the pain goes away completely between bouts)     Come and go 6. SEVERITY: "How bad is the pain?"  (e.g., Scale 1-10; mild, moderate, or severe)   - MILD (1-3): doesn't interfere with normal activities, abdomen soft and not tender to touch    - MODERATE (4-7): interferes with normal activities or awakens from sleep, tender to touch    - SEVERE (8-10): excruciating pain, doubled over, unable to do any normal activities      Mild 7. RECURRENT SYMPTOM: "Have you ever had this type of abdominal pain before?" If so, ask: "When was the last time?" and "What happened that time?"      No 8. CAUSE: "What do you think is causing the abdominal pain?"     No idea 9. RELIEVING/AGGRAVATING FACTORS: "What makes it better or worse?" (e.g., movement, antacids, bowel movement)     Nothing is done for it 10.  OTHER SYMPTOMS: "Has there been any vomiting, diarrhea, constipation, or urine problems?"     No 11. PREGNANCY: "Is there any chance you are pregnant?" "When was your last menstrual period?"      No  Protocols used: ABDOMINAL PAIN - [facility]

## 2019-01-26 ENCOUNTER — Encounter: Payer: Self-pay | Admitting: Family Medicine

## 2019-01-26 ENCOUNTER — Ambulatory Visit (INDEPENDENT_AMBULATORY_CARE_PROVIDER_SITE_OTHER): Payer: Medicare Other | Admitting: Family Medicine

## 2019-01-26 VITALS — BP 131/69 | HR 57 | Ht 60.0 in | Wt 130.0 lb

## 2019-01-26 DIAGNOSIS — R1031 Right lower quadrant pain: Secondary | ICD-10-CM | POA: Diagnosis not present

## 2019-01-26 DIAGNOSIS — I10 Essential (primary) hypertension: Secondary | ICD-10-CM | POA: Diagnosis not present

## 2019-01-26 MED ORDER — ATORVASTATIN CALCIUM 40 MG PO TABS: 40.0000 mg | ORAL_TABLET | Freq: Every day | ORAL | 2 refills | Status: DC

## 2019-01-26 MED ORDER — AMLODIPINE BESYLATE 5 MG PO TABS: 5.0000 mg | ORAL_TABLET | Freq: Every day | ORAL | 3 refills | Status: DC

## 2019-01-26 NOTE — Patient Instructions (Signed)
There are no preventive care reminders to display for this patient.  Depression screen [facility] 2/9 03/11/2018 02/04/2018 08/26/2017  Decreased Interest 0 0 0  Down, Depressed, Hopeless 3 0 1  PHQ - 2 Score 3 0 1  Altered sleeping 0 - -  Tired, decreased energy 1 - -  Change in appetite 0 - -  Feeling bad or failure about yourself  0 - -  Trouble concentrating 0 - -  Moving slowly or fidgety/restless 0 - -  Suicidal thoughts 0 - -  PHQ-9 Score 4 - -  Difficult doing work/chores Not difficult at all - -

## 2019-01-26 NOTE — Progress Notes (Signed)
Phone [phone]   Subjective:  Virtual visit via Video note. Chief complaint: Chief Complaint  Patient presents with  . Abdominal Pain    patient says the pain is mostly on the right side, upper, not all the time, today it's a mild pain. She has not taken anything for the pain and denies any other symptoms   This visit type was conducted due to national recommendations for restrictions regarding the COVID-19 Pandemic (e.g. social distancing).  This format is felt to be most appropriate for this patient at this time balancing risks to patient and risks to population by having him in for in person visit.  No physical exam was performed (except for noted visual exam or audio findings with Telehealth visits).    Our team/I connected with [name] at  2:20 PM EDT by a video enabled telemedicine application ([email or web address] or caregility through epic) and verified that I am speaking with the correct person using two identifiers.  Location patient: [place] Location provider: [facility], office Persons participating in the virtual visit:  patient  Our team/I discussed the limitations of evaluation and management by telemedicine and the availability of in person appointments. In light of current covid-19 pandemic, patient also understands that we are trying to protect them by minimizing in office contact if at all possible.  The patient expressed consent for telemedicine visit and agreed to proceed. Patient understands insurance will be billed.   ROS- no fever, chills, dysuria, polyuria, nausea, vomtiing, constipation   Past Medical History-  Patient Active Problem List   Diagnosis Date Noted  . Meningioma ([place]) 03/31/2018    Priority: High  . History of CVA in adulthood 03/31/2018    Priority: High  . Cerebral amyloid angiopathy ([place]) 05/29/2015    Priority: High  . Atrial fibrillation ([place]) 03/29/2015    Priority: High  . History of intracerebral hemorrhage      Priority: High  .  Chronic kidney disease, stage III (moderate) (HCC) 08/01/2018    Priority: Medium  . Hyperlipidemia 03/15/2018    Priority: Medium  . Reactive depression 08/14/2015    Priority: Medium  . Essential hypertension 06/22/2006    Priority: Medium  . Urinary incontinence 04/28/2017    Priority: Low  . Dizzy spells 11/10/2014    Priority: Low  . SPINAL STENOSIS, CERVICAL 06/11/2010    Priority: Low  . Arthritis of right knee 06/22/2006    Priority: Low  . Osteopenia 06/22/2006    Priority: Low  . Intention tremor 06/21/2015    Medications- reviewed and updated Current Outpatient Medications  Medication Sig Dispense Refill  . amLODipine (NORVASC) 5 MG tablet Take 1 tablet (5 mg total) by mouth daily. 90 tablet 3  . atorvastatin (LIPITOR) 40 MG tablet Take 1 tablet (40 mg total) by mouth at bedtime. 90 tablet 2  . losartan (COZAAR) 100 MG tablet TAKE 1 TABLET BY MOUTH EVERY DAY 90 tablet 1  . Multiple Vitamin (MULTIVITAMIN) tablet Take 1 tablet by mouth daily.       No current facility-administered medications for this visit.      Objective:  BP 131/69   Pulse (!) 57   Ht 5' (1.524 m)   Wt 130 lb (59 kg)   SpO2 95%   BMI 25.39 kg/m  self reported vitals Gen: NAD, resting comfortably Lungs: nonlabored, normal respiratory rate  Points to right lower quadrant as source of pain- does not hurt at present Skin: appears dry, no obvious rash  Assessment and Plan   # RLQ pain (mild intermittent) S:  RLQ pain Going on for under 3 months.Patient states every so often she gets a pain in right lower quadrant of abdomen. It seems to come and go- sometimes no pain at all and other days it will flare up. Does not take anything for the pain. Has gained some slight weight.  About the same as when it started. Has not had any weight loss with it. Pain at worst 1-2/10  At one point was hard getting in and out of bed- possible pulled muscle. They got a new mattress but only recently- daughter  would like patient to avoid workup (wants to avoid out of home exposure)  Does admit to some urgency of urination- but stable for 6 months. No other urinary symptoms. Good normal bowel movement on daily basis.  A/P: RLQ pain of unclear etiology. Could be muscular strain- leading diagnosis since corresponded to issues with getting out of bed. Advised heat and tylenol efforts since patient has not tried yet- she will consider this.   -discussed if she is worried about malignancy could consider cbc, cmp, lipase, UA, CT scan- we thought with mild intermittent nature reasonable to monitor.  -kidney stones possible but would suspect more severe  #hypertension S: controlled on home checks with 130s over 60s or 70s. Slightly higher earlier today but was after getting Hair done. We used one of the many recent well controlled #s today BP Readings from Last 3 Encounters:  01/26/19 131/69  10/31/18 130/70  08/01/18 132/84  A/P: Stable. Continue current medications.   Lab/Order associations: RLQ abdominal pain  Essential hypertension  Meds ordered this encounter  Medications  . amLODipine (NORVASC) 5 MG tablet    Sig: Take 1 tablet (5 mg total) by mouth daily.    Dispense:  90 tablet    Refill:  3  . atorvastatin (LIPITOR) 40 MG tablet    Sig: Take 1 tablet (40 mg total) by mouth at bedtime.    Dispense:  90 tablet    Refill:  2    Return precautions advised.  Garret Reddish, MD

## 2019-02-01 ENCOUNTER — Ambulatory Visit: Payer: Medicare Other | Admitting: Family Medicine

## 2019-05-05 ENCOUNTER — Encounter: Payer: Self-pay | Admitting: Family Medicine

## 2019-05-05 ENCOUNTER — Other Ambulatory Visit: Payer: Self-pay

## 2019-05-05 ENCOUNTER — Ambulatory Visit (INDEPENDENT_AMBULATORY_CARE_PROVIDER_SITE_OTHER): Payer: Medicare Other | Admitting: Family Medicine

## 2019-05-05 VITALS — BP 138/80 | HR 74 | Temp 98.0°F | Ht 60.0 in | Wt 127.6 lb

## 2019-05-05 DIAGNOSIS — D329 Benign neoplasm of meninges, unspecified: Secondary | ICD-10-CM

## 2019-05-05 DIAGNOSIS — N183 Chronic kidney disease, stage 3 unspecified: Secondary | ICD-10-CM

## 2019-05-05 DIAGNOSIS — Z23 Encounter for immunization: Secondary | ICD-10-CM | POA: Diagnosis not present

## 2019-05-05 DIAGNOSIS — E785 Hyperlipidemia, unspecified: Secondary | ICD-10-CM | POA: Diagnosis not present

## 2019-05-05 DIAGNOSIS — I4891 Unspecified atrial fibrillation: Secondary | ICD-10-CM

## 2019-05-05 DIAGNOSIS — I680 Cerebral amyloid angiopathy: Secondary | ICD-10-CM

## 2019-05-05 DIAGNOSIS — E854 Organ-limited amyloidosis: Secondary | ICD-10-CM

## 2019-05-05 LAB — CBC WITH DIFFERENTIAL/PLATELET
Basophils Absolute: 0 10*3/uL (ref 0.0–0.1)
Basophils Relative: 0.4 % (ref 0.0–3.0)
Eosinophils Absolute: 0.1 10*3/uL (ref 0.0–0.7)
Eosinophils Relative: 0.6 % (ref 0.0–5.0)
HCT: 42.7 % (ref 36.0–46.0)
Hemoglobin: 14.3 g/dL (ref 12.0–15.0)
Lymphocytes Relative: 16.1 % (ref 12.0–46.0)
Lymphs Abs: 1.4 10*3/uL (ref 0.7–4.0)
MCHC: 33.4 g/dL (ref 30.0–36.0)
MCV: 91.7 fl (ref 78.0–100.0)
Monocytes Absolute: 0.9 10*3/uL (ref 0.1–1.0)
Monocytes Relative: 10.3 % (ref 3.0–12.0)
Neutro Abs: 6.5 10*3/uL (ref 1.4–7.7)
Neutrophils Relative %: 72.6 % (ref 43.0–77.0)
Platelets: 152 10*3/uL (ref 150.0–400.0)
RBC: 4.66 Mil/uL (ref 3.87–5.11)
RDW: 14.5 % (ref 11.5–15.5)
WBC: 9 10*3/uL (ref 4.0–10.5)

## 2019-05-05 LAB — LIPID PANEL
Cholesterol: 139 mg/dL (ref 0–200)
HDL: 70.4 mg/dL (ref >39.00)
LDL Cholesterol: 56 mg/dL (ref 0–99)
NonHDL: 68.87
Total CHOL/HDL Ratio: 2
Triglycerides: 66 mg/dL (ref 0.0–149.0)
VLDL: 13.2 mg/dL (ref 0.0–40.0)

## 2019-05-05 LAB — COMPREHENSIVE METABOLIC PANEL
ALT: 17 U/L (ref 0–35)
AST: 19 U/L (ref 0–37)
Albumin: 4.3 g/dL (ref 3.5–5.2)
Alkaline Phosphatase: 56 U/L (ref 39–117)
BUN: 31 mg/dL — ABNORMAL HIGH (ref 6–23)
CO2: 28 mEq/L (ref 19–32)
Calcium: 9.4 mg/dL (ref 8.4–10.5)
Chloride: 106 mEq/L (ref 96–112)
Creatinine, Ser: 1.03 mg/dL (ref 0.40–1.20)
GFR: 49.32 mL/min — ABNORMAL LOW (ref >60.00)
Glucose, Bld: 98 mg/dL (ref 70–99)
Potassium: 4.2 mEq/L (ref 3.5–5.1)
Sodium: 143 mEq/L (ref 135–145)
Total Bilirubin: 1.4 mg/dL — ABNORMAL HIGH (ref 0.2–1.2)
Total Protein: 6.6 g/dL (ref 6.0–8.3)

## 2019-05-05 LAB — TSH: TSH: 3.32 u[IU]/mL (ref 0.35–4.50)

## 2019-05-05 NOTE — Progress Notes (Signed)
Phone [phone]   Subjective:  [name] is a 85 y.o. year old very pleasant female patient who presents for/with See problem oriented charting Chief Complaint  Patient presents with  . Follow-up    Not fasting today.   . Meningioma  . Atrial Fibrillation  . Hypertension  . Hyperlipidemia   ROS- No chest pain or shortness of breath. No headache or blurry vision.    Past Medical History-  Patient Active Problem List   Diagnosis Date Noted  . Meningioma ([place]) 03/31/2018    Priority: High  . History of CVA in adulthood 03/31/2018    Priority: High  . Cerebral amyloid angiopathy ([place]) 05/29/2015    Priority: High  . Atrial fibrillation ([place]) 03/29/2015    Priority: High  . History of intracerebral hemorrhage      Priority: High  . Chronic kidney disease, stage III (moderate) (HCC) 08/01/2018    Priority: Medium  . Hyperlipidemia 03/15/2018    Priority: Medium  . Reactive depression 08/14/2015    Priority: Medium  . Essential hypertension 06/22/2006    Priority: Medium  . Urinary incontinence 04/28/2017    Priority: Low  . Dizzy spells 11/10/2014    Priority: Low  . SPINAL STENOSIS, CERVICAL 06/11/2010    Priority: Low  . Arthritis of right knee 06/22/2006    Priority: Low  . Osteopenia 06/22/2006    Priority: Low  . Intention tremor 06/21/2015    Medications- reviewed and updated Current Outpatient Medications  Medication Sig Dispense Refill  . amLODipine (NORVASC) 5 MG tablet Take 1 tablet (5 mg total) by mouth daily. 90 tablet 3  . atorvastatin (LIPITOR) 40 MG tablet Take 1 tablet (40 mg total) by mouth at bedtime. 90 tablet 2  . losartan (COZAAR) 100 MG tablet TAKE 1 TABLET BY MOUTH EVERY DAY 90 tablet 1  . Multiple Vitamin (MULTIVITAMIN) tablet Take 1 tablet by mouth daily.       No current facility-administered medications for this visit.      Objective:  BP 138/80 (BP Location: Left Arm, Patient Position: Sitting, Cuff Size: Normal)   Pulse  74   Temp 98 F (36.7 C) (Temporal)   Ht 5' (1.524 m)   Wt 127 lb 9.6 oz (57.9 kg)   SpO2 96%   BMI 24.92 kg/m  Gen: NAD, resting comfortably CV: RRR no murmurs rubs or gallops Lungs: CTAB no crackles, wheeze, rhonchi Abdomen: soft/nontender/nondistended/normal bowel sounds. No rebound or guarding.   Ext: trace edema Skin: warm, dry Neuro: walks without cane today    Assessment and Plan   #Meningioma S: Discovered at time of CVA July 2019.  We are going to discuss this at July 2020 visit for later-previously patient/family were open to repeating imaging but strongly against surgery- therefore may not be worth repeating imaging A/P: Doing COVID-19 patient declines further imaging at this time.  She also would not be interested in surgery so not sure I would benefit over risk for her at this time  #Atrial fibrillation/history of intracerebral hemorrhage on aspirin in 2016.  Later with stroke in July 2019-was then stressful.  And blood pressure was higher.  Some concern with atrial fibrillation that that may have been the cause/embolic.  No anticoagulation due to history of intracerebral hemorrhage S: Lingering issues for patient include mild dysarthria.  No worsening issues In regards to A. fib-anticoagulated with no medication due to risks.  Rate controlled without medication. A/P:  Stable. Continue current medications.      #  Hyperlipidemia S: Compliant with atorvastatin 40 mg.  Started after stroke in July 2019 though may have been embolic. A/P: Hopefully stable-update lipids today-continue atorvastatin   #Hypertension/CKD stage III S: Compliant with losartan 100 mg, amlodipine 5 mg.  History of edema on higher dose amlodipine.  GFR has been stable in the 50s Checking BP at home 2-3 x weekly. Staying consistently below 140/90. Reports 2 occurences of BP readings in the 150s.   A/P:  Stable. Continue current medications.   Update CMP in regards to CKD stage III-patient should avoid  NSAIDs  #Social update- celebrated 15 years married end of July 2020!  Part of her as was not able to celebrate.  Covid 19 hard on her- being cramped up is really hard for hre. Has not been able to bake/cook after prior issues with prolonged standing and she really missesthat as well. Does not enjoy food as much whten they purchase it and bring it in to the home.   #cerebral anmyloid angiopathy- concern prior bleeds could have been related to this- noted by Dr. Erlinda Hong in 05/2015- will not pursue further workup- thrilled no further bleeds  Recommended follow up: 7 month physical recommended-sooner if needed  Lab/Order associations:   ICD-10-CM   1. Hyperlipidemia, unspecified hyperlipidemia type  E78.5 Comprehensive metabolic panel    Lipid panel    TSH    CBC with Differential/Platelet    CANCELED: CBC  2. Chronic kidney disease, stage III (moderate) (HCC)  N18.3 CBC with Differential/Platelet  3. Atrial fibrillation, unspecified type ([place])  I48.91   4. Meningioma ([place])  D32.9   5. Cerebral amyloid angiopathy (HCC)  E85.4    I68.0   6. Need for influenza vaccination  Z23 Flu Vaccine QUAD High Dose(Fluad)   Return precautions advised.  Garret Reddish, MD

## 2019-05-05 NOTE — Patient Instructions (Addendum)
Health Maintenance Due  Topic Date Due  . INFLUENZA VACCINE - high dose flu shot today 03/25/2019   7 months for a physical to get further out of cold/flu/covid season!   Happy to see you sooner if needed- no changes today   Please stop by lab before you go If you do not have mychart- we will call you about results within 5 business days of [place] receiving them.  If you have mychart- we will send your results within 3 business days of [place] receiving them.  If abnormal or we want to clarify a result, we will call or mychart you to make sure you receive the message.  If you have questions or concerns or don't hear within 5-7 days, please send [place] a message or call us.

## 2019-05-15 DIAGNOSIS — Z961 Presence of intraocular lens: Secondary | ICD-10-CM | POA: Diagnosis not present

## 2019-05-15 DIAGNOSIS — H43812 Vitreous degeneration, left eye: Secondary | ICD-10-CM | POA: Diagnosis not present

## 2019-05-15 DIAGNOSIS — H35363 Drusen (degenerative) of macula, bilateral: Secondary | ICD-10-CM | POA: Diagnosis not present

## 2019-05-15 DIAGNOSIS — H353132 Nonexudative age-related macular degeneration, bilateral, intermediate dry stage: Secondary | ICD-10-CM | POA: Diagnosis not present

## 2019-06-22 ENCOUNTER — Other Ambulatory Visit: Payer: Self-pay | Admitting: Family Medicine

## 2019-07-06 ENCOUNTER — Ambulatory Visit: Payer: Medicare Other | Admitting: Family Medicine

## 2019-07-28 ENCOUNTER — Other Ambulatory Visit: Payer: Self-pay | Admitting: Family Medicine

## 2019-07-28 MED ORDER — AMLODIPINE BESYLATE 5 MG PO TABS: 5.0000 mg | ORAL_TABLET | Freq: Every day | ORAL | 3 refills | Status: DC

## 2019-07-28 MED ORDER — LOSARTAN POTASSIUM 100 MG PO TABS: 100.0000 mg | ORAL_TABLET | Freq: Every day | ORAL | 0 refills | Status: DC

## 2019-07-28 MED ORDER — ATORVASTATIN CALCIUM 40 MG PO TABS: 40.0000 mg | ORAL_TABLET | Freq: Every day | ORAL | 2 refills | Status: DC

## 2019-07-28 NOTE — Telephone Encounter (Signed)
Copied from [place] [id number]. Topic: Quick Communication - Rx Refill/Question >> Jul 28, 2019 11:58 AM Leward Quan A wrote: Medication:amLODipine (NORVASC) 5 MG tablet, atorvastatin (LIPITOR) 40 MG tablet, losartan (COZAAR) 100 MG tablet   New pharmacy need Rx for all medication  Has the patient contacted their pharmacy? Yes.   (Agent: If no, request that the patient contact the pharmacy for the refill.) (Agent: If yes, when and what did the pharmacy advise?)  Preferred Pharmacy (with phone number or street name): [place], [place] [phone] (Phone) [phone] (Fax)    Agent: Please be advised that RX refills may take up to 3 business days. We ask that you follow-up with your pharmacy.

## 2019-08-08 DIAGNOSIS — H538 Other visual disturbances: Secondary | ICD-10-CM | POA: Diagnosis not present

## 2019-08-08 DIAGNOSIS — H43812 Vitreous degeneration, left eye: Secondary | ICD-10-CM | POA: Diagnosis not present

## 2019-08-08 DIAGNOSIS — H353132 Nonexudative age-related macular degeneration, bilateral, intermediate dry stage: Secondary | ICD-10-CM | POA: Diagnosis not present

## 2019-08-21 ENCOUNTER — Ambulatory Visit (INDEPENDENT_AMBULATORY_CARE_PROVIDER_SITE_OTHER): Payer: Medicare Other

## 2019-08-21 ENCOUNTER — Other Ambulatory Visit: Payer: Self-pay

## 2019-08-21 DIAGNOSIS — Z0000 Encounter for general adult medical examination without abnormal findings: Secondary | ICD-10-CM

## 2019-08-21 NOTE — Progress Notes (Signed)
This visit is being conducted via phone call due to the COVID-19 pandemic. This patient has given me verbal consent via phone to conduct this visit, patient states they are participating from their home address. Some vital signs may be absent or patient reported.   Patient identification: identified by name, DOB, and current address.  Location provider: [facility], Office Persons participating in the virtual visit: [name], patient, spouse [name] [facility]), and Dr. Dimas [name]   Subjective:   [name] is a 85 y.o. female who presents for Medicare Annual (Subsequent) preventive examination.  Review of Systems:   Cardiac Risk Factors include: advanced age (>25men, >71 women);hypertension    Objective:     Vitals: There were no vitals taken for this visit.  There is no height or weight on file to calculate BMI.  Advanced Directives 08/21/2019 07/20/2018 06/07/2018 02/04/2018 01/21/2016 10/14/2015 08/14/2015  Does Patient Have a Medical Advance Directive? Yes No No Yes Yes Yes Yes  Type of Advance Directive [place] will -  Does patient want to make changes to medical advance directive? No - Patient declined - - - - - -  Copy of [place] in Chart? No - copy requested - - - - - -  Would patient like information on creating a medical advance directive? - - No - Patient declined - - - -    Tobacco Social History   Tobacco Use  Smoking Status Never Smoker  Smokeless Tobacco Never Used     Counseling given: Not Answered   Clinical Intake:  Pre-visit preparation completed: Yes  Pain : No/denies pain  Diabetes: No  How often do you need to have someone help you when you read instructions, pamphlets, or other written materials from your doctor or pharmacy?: 1 - Never  Interpreter Needed?: No  Information entered by :: [name]  Past Medical History:  Diagnosis Date  . Hypertension   .  Osteopenia   . Stroke [facility])    Past Surgical History:  Procedure Laterality Date  . CATARACT EXTRACTION     Family History  Problem Relation Age of Onset  . Cancer Daughter        breast   Social History   Socioeconomic History  . Marital status: Married    Spouse name: Not on file  . Number of children: Not on file  . Years of education: Not on file  . Highest education level: Not on file  Occupational History  . Not on file  Tobacco Use  . Smoking status: Never Smoker  . Smokeless tobacco: Never Used  Substance and Sexual Activity  . Alcohol use: Yes    Alcohol/week: 1.0 standard drinks    Types: 1 Glasses of wine per week    Comment: 1 glass with dinner  . Drug use: No  . Sexual activity: Not on file  Other Topics Concern  . Not on file  Social History Narrative   Married (husband [name] age 75 in [place] practice) 63. 2 Frost oldest daughter to breast cancer. 4 grandkids.       Husband and patient live with daughter.       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       Hobbies: reading, had been quilting, enjoys cooking/baking.       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    Full Code.  Social Determinants of Health   Financial Resource Strain:   . Difficulty of Paying Living Expenses: Not on file  Food Insecurity:   . Worried About Charity fundraiser in the Last Year: Not on file  . Ran Out of Food in the Last Year: Not on file  Transportation Needs:   . Lack of Transportation (Medical): Not on file  . Lack of Transportation (Non-Medical): Not on file  Physical Activity:   . Days of Exercise per Week: Not on file  . Minutes of Exercise per Session: Not on file  Stress:   . Feeling of Stress : Not on file  Social Connections:   . Frequency of Communication with Friends and Family: Not on file  . Frequency of Social Gatherings with Friends and Family: Not on file  . Attends Religious Services: Not on file  .  Active Member of Clubs or Organizations: Not on file  . Attends Archivist Meetings: Not on file  . Marital Status: Not on file    Outpatient Encounter Medications as of 08/21/2019  Medication Sig  . amLODipine (NORVASC) 5 MG tablet Take 1 tablet (5 mg total) by mouth daily.  Marland Kitchen atorvastatin (LIPITOR) 40 MG tablet Take 1 tablet (40 mg total) by mouth at bedtime.  Marland Kitchen losartan (COZAAR) 100 MG tablet Take 1 tablet (100 mg total) by mouth daily.  . Multiple Vitamin (MULTIVITAMIN) tablet Take 1 tablet by mouth daily.     No facility-administered encounter medications on file as of 08/21/2019.    Activities of Daily Living In your present state of health, do you have any difficulty performing the following activities: 08/21/2019  Hearing? N  Vision? Y  Difficulty concentrating or making decisions? N  Walking or climbing stairs? N  Dressing or bathing? N  Doing errands, shopping? N  Preparing Food and eating ? N  Using the Toilet? N  In the past six months, have you accidently leaked urine? N  Do you have problems with loss of bowel control? N  Managing your Medications? N  Managing your Finances? N  Housekeeping or managing your Housekeeping? N  Some recent data might be hidden    Patient Care Team: Marin Olp, MD as PCP - General (Family Medicine) Zadie Rhine Clent Demark, MD as Consulting Physician (Ophthalmology)    Assessment:   This is a routine wellness examination for [name].  Exercise Activities and Dietary recommendations Current Exercise Habits: The patient does not participate in regular exercise at present  Goals    . patient      To continue to exercise and move and stay engaged. Keep coming for your appointments       Fall Risk Fall Risk  08/21/2019 05/05/2019 04/21/2018 02/04/2018 08/26/2017  Falls in the past year? 0 0 No No No  Number falls in past yr: - 0 - - -  Injury with Fall? 0 0 - - -  Comment - - - - -  Risk Factor Category  - - - - -  Risk  for fall due to : History of fall(s);Impaired mobility Impaired vision - - -  Follow up Falls evaluation completed;Education provided;Falls prevention discussed - - - -   Is the patient's home free of loose throw rugs in walkways, pet beds, electrical cords, etc?   yes      Grab bars in the bathroom? yes      Handrails on the stairs?   yes      Adequate  lighting?   yes  Depression Screen PHQ 2/9 Scores 08/21/2019 05/05/2019 01/26/2019 03/11/2018  PHQ - 2 Score 0 1 2 3   PHQ- 9 Score - - 4 4     Cognitive Function- no cognitive concerns at this time Cognitive Testing  Alert? Yes         Normal Appearance? N/a  Oriented to person? Yes           Place? Yes  Time? Yes  Recall of three objects? Yes  Can perform simple calculations? Yes  Displays appropriate judgment? Yes  Can read the correct time from a watch face? Yes   MMSE - Mini Mental State Exam 02/04/2018  Not completed: (No Data)        Immunization History  Administered Date(s) Administered  . Fluad Quad(high Dose 65+) 05/05/2019  . Influenza Split 05/27/2011, 05/18/2012  . Influenza Whole 06/22/2007, 05/25/2008, 06/04/2009, 05/05/2010  . Influenza, High Dose Seasonal PF 05/22/2016, 04/28/2017, 05/02/2018  . Influenza,inj,Quad PF,6+ Mos 05/17/2013, 06/21/2014, 06/06/2015  . Pneumococcal Conjugate-13 07/23/2014  . Pneumococcal Polysaccharide-23 06/22/2007  . Td 11/12/2014  . Zoster 05/25/2008    Qualifies for Shingles Vaccine?Discussed and patient will check with pharmacy for coverage.  Patient education handout provided   Screening Tests Health Maintenance  Topic Date Due  . TETANUS/TDAP  11/11/2024  . INFLUENZA VACCINE  Completed  . DEXA SCAN  Completed  . PNA vac Low Risk Adult  Completed    Cancer Screenings: Lung: Low Dose CT Chest recommended if Age 47-80 years, 30 pack-year currently smoking OR have quit w/in 15years. Patient does not qualify. Breast:  Up to date on Mammogram? Yes   Up to date of Bone  Density/Dexa? Yes Colorectal: No longer indicated     Plan:  I have personally reviewed and addressed the Medicare Annual Wellness questionnaire and have noted the following in the patient's chart:  A. Medical and social history B. Use of alcohol, tobacco or illicit drugs  C. Current medications and supplements D. Functional ability and status E.  Nutritional status F.  Physical activity G. Advance directives H. List of other physicians I.  Hospitalizations, surgeries, and ER visits in previous 12 months J.  [place] such as hearing and vision if needed, cognitive and depression L. Referrals, records requested, and appointments- none   In addition, I have reviewed and discussed with patient certain preventive protocols, quality metrics, and best practice recommendations. A written personalized care plan for preventive services as well as general preventive health recommendations were provided to patient.   Signed,  Carolyn George, [name]  Nurse Health Advisor   Nurse Notes: no additional

## 2019-08-21 NOTE — Patient Instructions (Signed)
[name] , Thank you for taking time to come for your Medicare Wellness Visit. I appreciate your ongoing commitment to your health goals. Please review the following plan we discussed and let me know if I can assist you in the future.   Screening recommendations/referrals: Colorectal Screening: No longer indicated  Mammogram: No longer indicated  Bone Density: No longer indicated   Vision and Dental Exams: Recommended annual ophthalmology exams for early detection of glaucoma and other disorders of the eye Recommended annual dental exams for proper oral hygiene  Vaccinations: Influenza vaccine: completed 05/05/19 Pneumococcal vaccine: up to date; last 07/23/14  Tdap vaccine: up to date; last 11/12/14 Shingles vaccine: Please call your insurance company to determine your out of pocket expense for the Shingrix vaccine. You may receive this vaccine at your local pharmacy.  Advanced directives: Please bring a copy of your POA (Power of Attorney) and/or Living Will to your next appointment.  Goals: Recommend to drink at least 6-8 8oz glasses of water per day and consume a balanced diet rich in fresh fruits and vegetables.   Next appointment: Please schedule your Annual Wellness Visit with your Nurse Health Advisor in one year.  Preventive Care 75 Years and Older, Female Preventive care refers to lifestyle choices and visits with your health care provider that can promote health and wellness. What does preventive care include?  A yearly physical exam. This is also called an annual well check.  Dental exams once or twice a year.  Routine eye exams. Ask your health care provider how often you should have your eyes checked.  Personal lifestyle choices, including:  Daily care of your teeth and gums.  Regular physical activity.  Eating a healthy diet.  Avoiding tobacco and drug use.  Limiting alcohol use.  Practicing safe sex.  Taking low-dose aspirin every day if recommended by  your health care provider.  Taking vitamin and mineral supplements as recommended by your health care provider. What happens during an annual well check? The services and screenings done by your health care provider during your annual well check will depend on your age, overall health, lifestyle risk factors, and family history of disease. Counseling  Your health care provider may ask you questions about your:  Alcohol use.  Tobacco use.  Drug use.  Emotional well-being.  Home and relationship well-being.  Sexual activity.  Eating habits.  History of falls.  Memory and ability to understand (cognition).  Work and work Statistician.  Reproductive health. Screening  You may have the following tests or measurements:  Height, weight, and BMI.  Blood pressure.  Lipid and cholesterol levels. These may be checked every 5 years, or more frequently if you are over 38 years old.  Skin check.  Lung cancer screening. You may have this screening every year starting at age 88 if you have a 30-pack-year history of smoking and currently smoke or have quit within the past 15 years.  Fecal occult blood test (FOBT) of the stool. You may have this test every year starting at age 39.  Flexible sigmoidoscopy or colonoscopy. You may have a sigmoidoscopy every 5 years or a colonoscopy every 10 years starting at age 99.  Hepatitis C blood test.  Hepatitis B blood test.  Sexually transmitted disease (STD) testing.  Diabetes screening. This is done by checking your blood sugar (glucose) after you have not eaten for a while (fasting). You may have this done every 1-3 years.  Bone density scan. This is done to  screen for osteoporosis. You may have this done starting at age 59.  Mammogram. This may be done every 1-2 years. Talk to your health care provider about how often you should have regular mammograms. Talk with your health care provider about your test results, treatment options, and  if necessary, the need for more tests. Vaccines  Your health care provider may recommend certain vaccines, such as:  Influenza vaccine. This is recommended every year.  Tetanus, diphtheria, and acellular pertussis (Tdap, Td) vaccine. You may need a Td booster every 10 years.  Zoster vaccine. You may need this after age 40.  Pneumococcal 13-valent conjugate (PCV13) vaccine. One dose is recommended after age 74.  Pneumococcal polysaccharide (PPSV23) vaccine. One dose is recommended after age 38. Talk to your health care provider about which screenings and vaccines you need and how often you need them. This information is not intended to replace advice given to you by your health care provider. Make sure you discuss any questions you have with your health care provider. Document Released: 09/06/2015 Document Revised: 04/29/2016 Document Reviewed: 06/11/2015 Elsevier Interactive Patient Education  2017 [place] Prevention in the Home Falls can cause injuries. They can happen to people of all ages. There are many things you can do to make your home safe and to help prevent falls. What can I do on the outside of my home?  Regularly fix the edges of walkways and driveways and fix any cracks.  Remove anything that might make you trip as you walk through a door, such as a raised step or threshold.  Trim any bushes or trees on the path to your home.  Use bright outdoor lighting.  Clear any walking paths of anything that might make someone trip, such as rocks or tools.  Regularly check to see if handrails are loose or broken. Make sure that both sides of any steps have handrails.  Any raised decks and porches should have guardrails on the edges.  Have any leaves, snow, or ice cleared regularly.  Use sand or salt on walking paths during winter.  Clean up any spills in your garage right away. This includes oil or grease spills. What can I do in the bathroom?  Use night  lights.  Install grab bars by the toilet and in the tub and shower. Do not use towel bars as grab bars.  Use non-skid mats or decals in the tub or shower.  If you need to sit down in the shower, use a plastic, non-slip stool.  Keep the floor dry. Clean up any water that spills on the floor as soon as it happens.  Remove soap buildup in the tub or shower regularly.  Attach bath mats securely with double-sided non-slip rug tape.  Do not have throw rugs and other things on the floor that can make you trip. What can I do in the bedroom?  Use night lights.  Make sure that you have a light by your bed that is easy to reach.  Do not use any sheets or blankets that are too big for your bed. They should not hang down onto the floor.  Have a firm chair that has side arms. You can use this for support while you get dressed.  Do not have throw rugs and other things on the floor that can make you trip. What can I do in the kitchen?  Clean up any spills right away.  Avoid walking on wet floors.  Keep items that  you use a lot in easy-to-reach places.  If you need to reach something above you, use a strong step stool that has a grab bar.  Keep electrical cords out of the way.  Do not use floor polish or wax that makes floors slippery. If you must use wax, use non-skid floor wax.  Do not have throw rugs and other things on the floor that can make you trip. What can I do with my stairs?  Do not leave any items on the stairs.  Make sure that there are handrails on both sides of the stairs and use them. Fix handrails that are broken or loose. Make sure that handrails are as long as the stairways.  Check any carpeting to make sure that it is firmly attached to the stairs. Fix any carpet that is loose or worn.  Avoid having throw rugs at the top or bottom of the stairs. If you do have throw rugs, attach them to the floor with carpet tape.  Make sure that you have a light switch at the  top of the stairs and the bottom of the stairs. If you do not have them, ask someone to add them for you. What else can I do to help prevent falls?  Wear shoes that:  Do not have high heels.  Have rubber bottoms.  Are comfortable and fit you well.  Are closed at the toe. Do not wear sandals.  If you use a stepladder:  Make sure that it is fully opened. Do not climb a closed stepladder.  Make sure that both sides of the stepladder are locked into place.  Ask someone to hold it for you, if possible.  Clearly mark and make sure that you can see:  Any grab bars or handrails.  First and last steps.  Where the edge of each step is.  Use tools that help you move around (mobility aids) if they are needed. These include:  Canes.  Walkers.  Scooters.  Crutches.  Turn on the lights when you go into a dark area. Replace any light bulbs as soon as they burn out.  Set up your furniture so you have a clear path. Avoid moving your furniture around.  If any of your floors are uneven, fix them.  If there are any pets around you, be aware of where they are.  Review your medicines with your doctor. Some medicines can make you feel dizzy. This can increase your chance of falling. Ask your doctor what other things that you can do to help prevent falls. This information is not intended to replace advice given to you by your health care provider. Make sure you discuss any questions you have with your health care provider. Document Released: 06/06/2009 Document Revised: 01/16/2016 Document Reviewed: 09/14/2014 Elsevier Interactive Patient Education  2017 [employer].

## 2019-09-11 ENCOUNTER — Ambulatory Visit: Payer: Medicare Other | Admitting: Podiatry

## 2019-09-11 ENCOUNTER — Encounter: Payer: Self-pay | Admitting: Podiatry

## 2019-09-11 ENCOUNTER — Other Ambulatory Visit: Payer: Self-pay

## 2019-09-11 VITALS — BP 128/52 | HR 67 | Temp 97.5°F

## 2019-09-11 DIAGNOSIS — M79675 Pain in left toe(s): Secondary | ICD-10-CM

## 2019-09-11 DIAGNOSIS — B351 Tinea unguium: Secondary | ICD-10-CM | POA: Diagnosis not present

## 2019-09-11 DIAGNOSIS — M2011 Hallux valgus (acquired), right foot: Secondary | ICD-10-CM

## 2019-09-11 DIAGNOSIS — M2042 Other hammer toe(s) (acquired), left foot: Secondary | ICD-10-CM

## 2019-09-11 DIAGNOSIS — M2041 Other hammer toe(s) (acquired), right foot: Secondary | ICD-10-CM | POA: Diagnosis not present

## 2019-09-11 DIAGNOSIS — L84 Corns and callosities: Secondary | ICD-10-CM | POA: Diagnosis not present

## 2019-09-11 DIAGNOSIS — M2012 Hallux valgus (acquired), left foot: Secondary | ICD-10-CM

## 2019-09-11 DIAGNOSIS — M79674 Pain in right toe(s): Secondary | ICD-10-CM

## 2019-09-16 NOTE — Progress Notes (Signed)
Subjective: [name] presents today referred by Marin Olp, MD for complaint of painful mycotic nails b/l that are difficult to trim. Pain interferes with ambulation. Aggravating factors include wearing enclosed shoe gear. Pain is relieved with periodic professional debridement.   She presents with caregiver, Domenic Schwab.   Ms. Kady states she had gotten pedicures in the past.   Past Medical History:  Diagnosis Date  . Hypertension   . Osteopenia   . Stroke [facility])      Patient Active Problem List   Diagnosis Date Noted  . Chronic kidney disease, stage III (moderate) 08/01/2018  . Meningioma ([place]) 03/31/2018  . History of CVA in adulthood 03/31/2018  . Hyperlipidemia 03/15/2018  . Urinary incontinence 04/28/2017  . Reactive depression 08/14/2015  . Intention tremor 06/21/2015  . Cerebral amyloid angiopathy ([place]) 05/29/2015  . Atrial fibrillation ([place]) 03/29/2015  . History of intracerebral hemorrhage    . Dizzy spells 11/10/2014  . SPINAL STENOSIS, CERVICAL 06/11/2010  . Essential hypertension 06/22/2006  . Arthritis of right knee 06/22/2006  . Osteopenia 06/22/2006     Past Surgical History:  Procedure Laterality Date  . CATARACT EXTRACTION       Current Outpatient Medications on File Prior to Visit  Medication Sig Dispense Refill  . amLODipine (NORVASC) 5 MG tablet Take 1 tablet (5 mg total) by mouth daily. 90 tablet 3  . atorvastatin (LIPITOR) 40 MG tablet Take 1 tablet (40 mg total) by mouth at bedtime. 90 tablet 2  . losartan (COZAAR) 100 MG tablet Take 1 tablet (100 mg total) by mouth daily. 90 tablet 0  . Multiple Vitamin (MULTIVITAMIN) tablet Take 1 tablet by mouth daily.       No current facility-administered medications on file prior to visit.     Allergies  Allergen Reactions  . Ace Inhibitors     REACTION: angioedema     Social History   Occupational History  . Not on file  Tobacco Use  . Smoking status: Never Smoker  . Smokeless  tobacco: Never Used  Substance and Sexual Activity  . Alcohol use: Yes    Alcohol/week: 1.0 standard drinks    Types: 1 Glasses of wine per week    Comment: 1 glass with dinner  . Drug use: No  . Sexual activity: Not on file     Family History  Problem Relation Age of Onset  . Cancer Daughter        breast     Immunization History  Administered Date(s) Administered  . Fluad Quad(high Dose 65+) 05/05/2019  . Influenza Split 05/27/2011, 05/18/2012  . Influenza Whole 06/22/2007, 05/25/2008, 06/04/2009, 05/05/2010  . Influenza, High Dose Seasonal PF 05/22/2016, 04/28/2017, 05/02/2018  . Influenza,inj,Quad PF,6+ Mos 05/17/2013, 06/21/2014, 06/06/2015  . Pneumococcal Conjugate-13 07/23/2014  . Pneumococcal Polysaccharide-23 06/22/2007  . Td 11/12/2014  . Zoster 05/25/2008     Objective: Vitals:   09/11/19 1042  BP: (!) 128/52  Pulse: 67  Temp: (!) 97.5 F (36.4 C)    Vascular Examination:  capillary fill time to digits <3s b/l, palpable DP pulses b/l, palpable PT pulses b/l, pedal hair sparse b/l and skin temperature gradient within normal limits b/l  Dermatological Examination: Pedal skin is thin shiny, atrophic bilaterally., No open wounds bilaterally., No interdigital macerations bilaterally. and Hyperkeratotic lesion(s) R submet head 1, right dorsal PIPJ 4th digit. No erythema, no edema, no drainage, no flocculence.  onychomycosis of the toenails 1-5 b/l which exhibit elongation, dystrophy, discoloration, thickening, subungual  debris, pain to palpation of nail beds and subungual hematoma  Musculoskeletal: normal muscle strength 5/5 to all lower extremity muscle groups bilaterally, no pain crepitus or joint limitation noted with ROM, bunion deformity noted B and hammertoes noted to the 2nd toe, 3rd toe, 4th toe, 5th toe b/l  Neurological: sensation intact 5/5 intact bilaterally with 10g monofilament and vibratory sensation intact  Assessment: 1. Pain due to  onychomycosis of toenails of both feet   2. Corns and callosities   3. Acquired hammertoes of both feet   4. Hallux valgus, acquired, bilateral     Plan: -Discussed diagnoses and treatment options.  -Corn(s) right 4th digit pared utilizing sterile scalpel blade without incident. Calluses submet head 1 right foot pared  utilizing sterile scalpel blade without incident. -Toenails 1-5 b/l were debrided in length and girth without iatrogenic bleeding. -Patient to continue soft, supportive shoe gear daily. -Patient to report any pedal injuries to medical professional immediately. -Patient/POA to call should there be question/concern in the interim.  Return in about 3 months (around 12/10/2019).

## 2019-09-20 ENCOUNTER — Ambulatory Visit (INDEPENDENT_AMBULATORY_CARE_PROVIDER_SITE_OTHER): Payer: Medicare Other | Admitting: Family Medicine

## 2019-09-20 ENCOUNTER — Encounter: Payer: Self-pay | Admitting: Family Medicine

## 2019-09-20 DIAGNOSIS — D329 Benign neoplasm of meninges, unspecified: Secondary | ICD-10-CM

## 2019-09-20 DIAGNOSIS — I4891 Unspecified atrial fibrillation: Secondary | ICD-10-CM | POA: Diagnosis not present

## 2019-09-20 DIAGNOSIS — I10 Essential (primary) hypertension: Secondary | ICD-10-CM

## 2019-09-20 NOTE — Patient Instructions (Signed)
There are no preventive care reminders to display for this patient. Depression screen [facility] 2/9 08/21/2019 05/05/2019 01/26/2019 03/11/2018 02/04/2018  Decreased Interest 0 0 0 0 0  Down, Depressed, Hopeless 0 1 2 3  0  PHQ - 2 Score 0 1 2 3  0  Altered sleeping - - 0 0 -  Tired, decreased energy - - 0 1 -  Change in appetite - - 0 0 -  Feeling bad or failure about yourself  - - 0 0 -  Trouble concentrating - - 2 0 -  Moving slowly or fidgety/restless - - 0 0 -  Suicidal thoughts - - 0 0 -  PHQ-9 Score - - 4 4 -  Difficult doing work/chores - - Not difficult at all Not difficult at all -

## 2019-09-20 NOTE — Progress Notes (Signed)
Phone [phone] Virtual visit via Video note   Subjective:  Chief complaint: Chief Complaint  Patient presents with  . Virtual visit   . feeling down    This visit type was conducted due to national recommendations for restrictions regarding the COVID-19 Pandemic (e.g. social distancing).  This format is felt to be most appropriate for this patient at this time balancing risks to patient and risks to population by having him in for in person visit.  No physical exam was performed (except for noted visual exam or audio findings with Telehealth visits).    Our team/I connected with [name] at 11:20 AM EST by a video enabled telemedicine application ([email or web address] or caregility through epic) and verified that I am speaking with the correct person using two identifiers.  Location patient: [facility] Location provider: [facility], office Persons participating in the virtual visit:  patient  Our team/I discussed the limitations of evaluation and management by telemedicine and the availability of in person appointments. In light of current covid-19 pandemic, patient also understands that we are trying to protect them by minimizing in office contact if at all possible.  The patient expressed consent for telemedicine visit and agreed to proceed. Patient understands insurance will be billed.   Past Medical History-  Patient Active Problem List   Diagnosis Date Noted  . Meningioma ([place]) 03/31/2018    Priority: High  . History of CVA in adulthood 03/31/2018    Priority: High  . Cerebral amyloid angiopathy ([place]) 05/29/2015    Priority: High  . Atrial fibrillation ([place]) 03/29/2015    Priority: High  . History of intracerebral hemorrhage      Priority: High  . Chronic kidney disease, stage III (moderate) 08/01/2018    Priority: Medium  . Hyperlipidemia 03/15/2018    Priority: Medium  . Reactive depression 08/14/2015    Priority: Medium  . Essential hypertension 06/22/2006    Priority:  Medium  . Urinary incontinence 04/28/2017    Priority: Low  . Dizzy spells 11/10/2014    Priority: Low  . SPINAL STENOSIS, CERVICAL 06/11/2010    Priority: Low  . Arthritis of right knee 06/22/2006    Priority: Low  . Osteopenia 06/22/2006    Priority: Low  . Intention tremor 06/21/2015    Medications- reviewed and updated Current Outpatient Medications  Medication Sig Dispense Refill  . amLODipine (NORVASC) 5 MG tablet Take 1 tablet (5 mg total) by mouth daily. 90 tablet 3  . atorvastatin (LIPITOR) 40 MG tablet Take 1 tablet (40 mg total) by mouth at bedtime. 90 tablet 2  . losartan (COZAAR) 100 MG tablet Take 1 tablet (100 mg total) by mouth daily. 90 tablet 0  . Multiple Vitamin (MULTIVITAMIN) tablet Take 1 tablet by mouth daily.       No current facility-administered medications for this visit.     Objective:  no self reported vitals other than prior BP reported below Gen: NAD, resting comfortably Lungs: nonlabored, normal respiratory rate  Skin: appears dry, no obvious rash     Assessment and Plan   #Health maintenance-on wait list for COVID-19 vaccination  #Feeling Down/head empty sensation #Hypertension/atrial fibrillation/meningioma  S:pt states she is feeling down and blue due to ongoing issues with COVID-19 but also has some feelings of being off in the morning.  When she gets up in the morning she feels like her head is empty or "filled with cotton" and does not have any energy the past couple of days. All she  wants to do is sleep. She feels like she needs a boost.  Patient also reflects that she has had similar symptoms over the last several months intermittently in the mornings.  She had seen her eye doctor who recommended new glasses-they wonder if that contributes to symptoms.  The symptom of her head being "filled with cotton" seems to resolve after breakfast.  Her thoughts do not seem completely clear until after breakfast.  Not 100% clear if it is always  just after breakfast or just with getting up and getting moving.  Patient does have a history of some cervical spine stenosis that apparently she was offered for surgery years ago with plates and screws -daughter wonders if she could be having some neck issues-patient denies neck pain.  Blood pressure or blood sugar have not been directly checked after she wakes up.  Patient is compliant with amlodipine before bed and losartan in the morning.  Later in the morning blood pressures have been okay in the 140s over 50s or 60s range.  With her high risk of falls we have not wanted to be more aggressive about blood pressure even despite stroke history.  A/P: Patient with constellation of symptoms of feeling off in the morning with "cotton in her head" sensation and thoughts not being completely clear until after she gets up and moving and have breakfast.  I told family I was concerned about blood sugars possibly being low-encouraged to have a bedtime snack that would also include some protein-she has occasionally had some [employer] cookies before bed.  I would also like to know what her blood sugar is in the morning when she wakes up-they will come by to pick up a meter.  I also encouraged checking blood pressure first thing in the morning to make sure not too high or too low-hopefully blood sugar above 80 and blood pressure above 135/75 or so.  For now for her blood pressure continue amlodipine at night and losartan in the daytime. -If we cannot find an obvious cause of symptoms I would want to have her back for CBC, CMP, TSH under fatigue. -Patient has a history of atrial fibrillation but is not on anticoagulation due to history of intracerebral hemorrhage-she has been rate controlled without medication.  When she checks her blood pressure we will also get a pulse rate in the mornings to make sure she is not in atrial fibrillation -We also discussed potentially doing neuro imaging if symptoms do not improve.   Patient does have a history of meningioma which was discovered in July 2019-previously since she was asymptomatic we had not opted to repeat imaging as she would not be a good candidate for intervention-with that being said with her being symptomatic would at least be worthwhile to know if this is contributing-MRI would also rule out stroke  Recommended follow up: We have in April follow-up planned but I asked for follow-up on data above by next week Future Appointments  Date Time Provider [place]  12/08/2019  1:00 PM Marin Olp, MD [facility]  12/11/2019  1:30 PM Marzetta Board, DPM [facility]    Lab/Order associations:   ICD-10-CM   1. Essential hypertension  I10   2. Atrial fibrillation, unspecified type ([place])  I48.91   3. Meningioma ([place])  D32.9    Return precautions advised.  Garret Reddish, MD

## 2019-09-20 NOTE — Progress Notes (Signed)
Called and let know ready at reception for pick up.

## 2019-09-26 DIAGNOSIS — H43812 Vitreous degeneration, left eye: Secondary | ICD-10-CM | POA: Diagnosis not present

## 2019-09-26 DIAGNOSIS — H353132 Nonexudative age-related macular degeneration, bilateral, intermediate dry stage: Secondary | ICD-10-CM | POA: Diagnosis not present

## 2019-09-27 ENCOUNTER — Other Ambulatory Visit: Payer: Self-pay | Admitting: Family Medicine

## 2019-09-30 ENCOUNTER — Other Ambulatory Visit: Payer: Self-pay | Admitting: Family Medicine

## 2019-10-11 ENCOUNTER — Emergency Department (HOSPITAL_COMMUNITY)
Admission: EM | Admit: 2019-10-11 | Discharge: 2019-10-11 | Disposition: A | Discharge status: Home / Self Care | Payer: Medicare Other | Attending: Emergency Medicine | Admitting: Emergency Medicine

## 2019-10-11 ENCOUNTER — Telehealth: Payer: Self-pay | Admitting: Family Medicine

## 2019-10-11 ENCOUNTER — Other Ambulatory Visit: Payer: Self-pay

## 2019-10-11 ENCOUNTER — Encounter (HOSPITAL_COMMUNITY): Payer: Self-pay

## 2019-10-11 ENCOUNTER — Emergency Department (HOSPITAL_COMMUNITY): Payer: Medicare Other

## 2019-10-11 DIAGNOSIS — W19XXXA Unspecified fall, initial encounter: Secondary | ICD-10-CM

## 2019-10-11 DIAGNOSIS — N183 Chronic kidney disease, stage 3 unspecified: Secondary | ICD-10-CM | POA: Diagnosis not present

## 2019-10-11 DIAGNOSIS — Y92003 Bedroom of unspecified non-institutional (private) residence as the place of occurrence of the external cause: Secondary | ICD-10-CM | POA: Diagnosis not present

## 2019-10-11 DIAGNOSIS — R531 Weakness: Secondary | ICD-10-CM | POA: Diagnosis not present

## 2019-10-11 DIAGNOSIS — W010XXA Fall on same level from slipping, tripping and stumbling without subsequent striking against object, initial encounter: Secondary | ICD-10-CM | POA: Insufficient documentation

## 2019-10-11 DIAGNOSIS — M79605 Pain in left leg: Secondary | ICD-10-CM | POA: Diagnosis not present

## 2019-10-11 DIAGNOSIS — I10 Essential (primary) hypertension: Secondary | ICD-10-CM | POA: Diagnosis not present

## 2019-10-11 DIAGNOSIS — Z743 Need for continuous supervision: Secondary | ICD-10-CM | POA: Diagnosis not present

## 2019-10-11 DIAGNOSIS — Y9389 Activity, other specified: Secondary | ICD-10-CM | POA: Insufficient documentation

## 2019-10-11 DIAGNOSIS — S79912A Unspecified injury of left hip, initial encounter: Secondary | ICD-10-CM | POA: Diagnosis not present

## 2019-10-11 DIAGNOSIS — Z79899 Other long term (current) drug therapy: Secondary | ICD-10-CM | POA: Insufficient documentation

## 2019-10-11 DIAGNOSIS — S3289XA Fracture of other parts of pelvis, initial encounter for closed fracture: Secondary | ICD-10-CM | POA: Diagnosis not present

## 2019-10-11 DIAGNOSIS — Y999 Unspecified external cause status: Secondary | ICD-10-CM | POA: Insufficient documentation

## 2019-10-11 DIAGNOSIS — M2550 Pain in unspecified joint: Secondary | ICD-10-CM | POA: Diagnosis not present

## 2019-10-11 DIAGNOSIS — Z7401 Bed confinement status: Secondary | ICD-10-CM | POA: Diagnosis not present

## 2019-10-11 DIAGNOSIS — S32592A Other specified fracture of left pubis, initial encounter for closed fracture: Secondary | ICD-10-CM | POA: Diagnosis not present

## 2019-10-11 DIAGNOSIS — I129 Hypertensive chronic kidney disease with stage 1 through stage 4 chronic kidney disease, or unspecified chronic kidney disease: Secondary | ICD-10-CM | POA: Diagnosis not present

## 2019-10-11 MED ORDER — FENTANYL CITRATE (PF) 100 MCG/2ML IJ SOLN
50.0000 ug | Freq: Once | INTRAMUSCULAR | Status: AC
  Administered 2019-10-11: 50 ug via INTRAVENOUS
  Filled 2019-10-11: qty 2

## 2019-10-11 MED ORDER — ACETAMINOPHEN 500 MG PO TABS: 1000.0000 mg | ORAL_TABLET | Freq: Once | ORAL | Status: DC

## 2019-10-11 MED ORDER — ACETAMINOPHEN 500 MG PO TABS
1000.0000 mg | ORAL_TABLET | Freq: Once | ORAL | Status: AC
  Administered 2019-10-11: 1000 mg via ORAL
  Filled 2019-10-11: qty 2

## 2019-10-11 NOTE — Telephone Encounter (Signed)
See below

## 2019-10-11 NOTE — ED Notes (Signed)
[facility] at bedside to transport patient home, daughter called and updated.

## 2019-10-11 NOTE — Discharge Instructions (Signed)
Patient should be able to apply touch pressure to the left lower extremity and should refrain from applying full body weight on the left leg.  Using a wheelchair will provide more stability for the patient given her age and pelvic fracture.  We have consulted with our transitional care team who will call and help arrange home health, home aide, physical therapy.  Social work will also be contacted to help assist with placement in a rehab facility if needed.

## 2019-10-11 NOTE — ED Notes (Signed)
Pt taken to xray 

## 2019-10-11 NOTE — ED Notes (Signed)
Purewick removed at this time. PTAR at bedside.

## 2019-10-11 NOTE — Telephone Encounter (Signed)
Patient daughter called in and stated that her mother was in the hospital for a fall and they told her to take Tylenol. Patient wanted to make sure it was ok she thought Dr. Yong Channel told her not to take it. Please advise.

## 2019-10-11 NOTE — Telephone Encounter (Signed)
She may take Tylenol/acetaminophen  She should always avoid ibuprofen/Aleve/Motrin

## 2019-10-11 NOTE — ED Triage Notes (Signed)
Patient arrived via [facility] after a mechanical fall tonight. Patient reports pain in her knee and thigh. Also reports hitting her head. Denies any LOC.

## 2019-10-11 NOTE — Telephone Encounter (Signed)
Returned pt daughter call and gave her below information.

## 2019-10-11 NOTE — ED Notes (Signed)
[employer] notified for PTAR transport  Back to residence.

## 2019-10-11 NOTE — Progress Notes (Signed)
TOC CM referral to arrange [facility]. TOC CM contacted Dtr, [name].Pt lives in the home with dtr. She has wheelchair, RW and electric bed. The bathroom is handicap accessible. Dtr has a Database administrator that comes 2 x per week and is recently started working with [facility] for additional out of pocket aide services. Contacted [facility] rep, Levonne Spiller with new referral for [facility]. Dtr states they have used [facility] in the past and is very satisfied with their services. Referring ED provider updated on referral. Jonnie Finner RN [place], [place] ED TOC CM [phone]

## 2019-10-11 NOTE — ED Provider Notes (Signed)
[facility] DEPT Provider Note  CSN: [id number] Arrival date & time: 10/11/19 0350  Chief Complaint(s) Fall  HPI [name] is a 86 y.o. female who presents from home after mechanical fall resulting in left hip pain.  Patient reports that she was getting out of bed trying to help her husband into bed.  While getting out of bear, patient reports losing her balance which caused her to fall and land on her left hip.  Patient does endorse mild head trauma without loss of consciousness.  She denies any headache, neck pain, back pain.  Left hip pain is mild to moderate felt in the left groin.  Alleviated by immobility.  Worse with palpation and range of motion.  Denies any other physical complaints.  Patient is not on any anticoagulation.  HPI  Past Medical History Past Medical History:  Diagnosis Date  . Hypertension   . Osteopenia   . Stroke [facility])    Patient Active Problem List   Diagnosis Date Noted  . Chronic kidney disease, stage III (moderate) 08/01/2018  . Meningioma ([place]) 03/31/2018  . History of CVA in adulthood 03/31/2018  . Hyperlipidemia 03/15/2018  . Urinary incontinence 04/28/2017  . Reactive depression 08/14/2015  . Intention tremor 06/21/2015  . Cerebral amyloid angiopathy ([place]) 05/29/2015  . Atrial fibrillation ([place]) 03/29/2015  . History of intracerebral hemorrhage    . Dizzy spells 11/10/2014  . SPINAL STENOSIS, CERVICAL 06/11/2010  . Essential hypertension 06/22/2006  . Arthritis of right knee 06/22/2006  . Osteopenia 06/22/2006   Home Medication(s) Prior to Admission medications   Medication Sig Start Date End Date Taking? Authorizing Provider  amLODipine (NORVASC) 5 MG tablet Take 1 tablet (5 mg total) by mouth daily. 07/28/19  Yes Marin Olp, MD  atorvastatin (LIPITOR) 40 MG tablet Take 1 tablet (40 mg total) by mouth at bedtime. 07/28/19  Yes Marin Olp, MD  losartan (COZAAR) 100 MG tablet TAKE 1 TABLET BY  MOUTH  DAILY Patient taking differently: Take 100 mg by mouth daily.  10/02/19  Yes Marin Olp, MD  Multiple Vitamin (MULTIVITAMIN) tablet Take 1 tablet by mouth daily.     Yes [provider]                                                                                                                                    Past Surgical History Past Surgical History:  Procedure Laterality Date  . CATARACT EXTRACTION     Family History Family History  Problem Relation Age of Onset  . Cancer Daughter        breast    Social History Social History   Tobacco Use  . Smoking status: Never Smoker  . Smokeless tobacco: Never Used  Substance Use Topics  . Alcohol use: Yes    Alcohol/week: 1.0 standard drinks    Types: 1 Glasses of wine per week  Comment: 1 glass with dinner  . Drug use: No   Allergies Ace inhibitors  Review of Systems Review of Systems All other systems are reviewed and are negative for acute change except as noted in the HPI  Physical Exam Vital Signs  I have reviewed the triage vital signs BP (!) 159/81 (BP Location: Right Arm)   Pulse 60   Temp 97.9 F (36.6 C) (Oral)   Resp 15   SpO2 97%   Physical Exam Constitutional:      General: She is not in acute distress.    Appearance: She is well-developed. She is not diaphoretic.  HENT:     Head: Normocephalic and atraumatic.     Right Ear: External ear normal.     Left Ear: External ear normal.     Nose: Nose normal.  Eyes:     General: No scleral icterus.       Right eye: No discharge.        Left eye: No discharge.     Conjunctiva/sclera: Conjunctivae normal.     Pupils: Pupils are equal, round, and reactive to light.  Cardiovascular:     Rate and Rhythm: Normal rate and regular rhythm.     Pulses:          Radial pulses are 2+ on the right side and 2+ on the left side.       Dorsalis pedis pulses are 2+ on the right side and 2+ on the left side.     Heart sounds: Normal heart  sounds. No murmur. No friction rub. No gallop.   Pulmonary:     Effort: Pulmonary effort is normal. No respiratory distress.     Breath sounds: Normal breath sounds. No stridor. No wheezing.  Abdominal:     General: There is no distension.     Palpations: Abdomen is soft.     Tenderness: There is no abdominal tenderness.  Musculoskeletal:     Cervical back: Normal range of motion and neck supple. No bony tenderness.     Thoracic back: No bony tenderness.     Lumbar back: No bony tenderness.     Left hip: Tenderness present. No deformity or crepitus. Normal strength.       Legs:     Comments: Clavicles stable. Chest stable to AP/Lat compression. Pelvis stable to Lat compression. No obvious extremity deformity. No chest or abdominal wall contusion.  Skin:    General: Skin is warm and dry.     Findings: No erythema or rash.  Neurological:     Mental Status: She is alert and oriented to person, place, and time.     Comments: Moving all extremities     ED Results and Treatments Labs (all labs ordered are listed, but only abnormal results are displayed) Labs Reviewed - No data to display  EKG  EKG Interpretation  Date/Time:    Ventricular Rate:    PR Interval:    QRS Duration:   QT Interval:    QTC Calculation:   R Axis:     Text Interpretation:        Radiology DG HIP UNILAT W OR W/O PELVIS 2-3 VIEWS LEFT  Result Date: 10/11/2019 CLINICAL DATA:  Fall with left hip pain. EXAM: DG HIP (WITH OR WITHOUT PELVIS) 2-3V LEFT COMPARISON:  None. FINDINGS: Suspected nondisplaced left superior and inferior pubic rami fractures. Femoral head is well seated in the acetabulum. No proximal femur fracture. Pubic symphysis and sacroiliac joints are congruent. The bones are under mineralized. Vascular calcifications. IMPRESSION: Suspected nondisplaced left superior and  inferior pubic rami fractures. Electronically Signed   By: Keith Rake M.D.   On: 10/11/2019 04:31    Pertinent labs & imaging results that were available during my care of the patient were reviewed by me and considered in my medical decision making (see chart for details).  Medications Ordered in ED Medications  acetaminophen (TYLENOL) tablet 1,000 mg (1,000 mg Oral Not Given 10/11/19 0622)  fentaNYL (SUBLIMAZE) injection 50 mcg (50 mcg Intravenous Given 10/11/19 0409)  acetaminophen (TYLENOL) tablet 1,000 mg (1,000 mg Oral Given 10/11/19 [id number])                                                                                                                                    Procedures Procedures  (including critical care time)  Medical Decision Making / ED Course I have reviewed the nursing notes for this encounter and the patient's prior records (if available in EHR or on provided paperwork).   [name] was evaluated in Emergency Department on 10/11/2019 for the symptoms described in the history of present illness. She was evaluated in the context of the global COVID-19 pandemic, which necessitated consideration that the patient might be at risk for infection with the SARS-CoV-2 virus that causes COVID-19. Institutional protocols and algorithms that pertain to the evaluation of patients at risk for COVID-19 are in a state of rapid change based on information released by regulatory bodies including the CDC and federal and state organizations. These policies and algorithms were followed during the patient's care in the ED.  Mechanical fall resulting in left sup and inferior pubic rami fracture, consistent with exam. Provided with pain meds.   Spoke with daughter whom the patient lives with and informs me that the patient's we are home is already set up for wheelchair accessibility.  She believes she would be able to take care of the patient at home but may require additional  assistance.  Discussed transitional care consultation for home health and aide as well as physical therapy and social work for possible placement if needed.      Final Clinical Impression(s) / ED Diagnoses Final diagnoses:  Fall, initial encounter  Closed fracture of multiple pubic rami,  left, initial encounter [facility])    The patient appears reasonably screened and/or stabilized for discharge and I doubt any other medical condition or other [facility] requiring further screening, evaluation, or treatment in the ED at this time prior to discharge. Safe for discharge with strict return precautions.  Disposition: Discharge  Condition: Good  I have discussed the results, Dx and Tx plan with the patient/family who expressed understanding and agree(s) with the plan. Discharge instructions discussed at length. The patient/family was given strict return precautions who verbalized understanding of the instructions. No further questions at time of discharge.    ED Discharge Orders    None        Follow Up: Hiram Gash, MD [address] [place] [address] [phone]  Schedule an appointment as soon as possible for a visit  For close follow up to assess for pelvic fracture  Marin Olp, [place] [address] [phone]  Schedule an appointment as soon as possible for a visit  As needed     This chart was dictated using voice recognition software.  Despite best efforts to proofread,  errors can occur which can change the documentation meaning.   Fatima Blank, MD 10/11/19 [phone]

## 2019-10-13 DIAGNOSIS — S329XXA Fracture of unspecified parts of lumbosacral spine and pelvis, initial encounter for closed fracture: Secondary | ICD-10-CM | POA: Diagnosis not present

## 2019-10-15 ENCOUNTER — Ambulatory Visit: Payer: Medicare Other | Attending: Internal Medicine

## 2019-10-15 DIAGNOSIS — Z23 Encounter for immunization: Secondary | ICD-10-CM | POA: Insufficient documentation

## 2019-10-15 NOTE — Progress Notes (Signed)
   Covid-19 Vaccination Clinic  Name:  [name]    MRN: [id number] DOB: 11/07/1917  10/15/2019  [name] was observed post Covid-19 immunization for 15 minutes without incidence. She was provided with Vaccine Information Sheet and instruction to access the V-Safe system.   [name] was instructed to call 911 with any severe reactions post vaccine: Marland Kitchen Difficulty breathing  . Swelling of your face and throat  . A fast heartbeat  . A bad rash all over your body  . Dizziness and weakness    Immunizations Administered    Name Date Dose VIS Date Route   Pfizer COVID-19 Vaccine 10/15/2019 12:26 PM 0.3 mL 08/04/2019 Intramuscular   Manufacturer: [place]   Lot: [id number]   [place]: [id number]

## 2019-10-16 ENCOUNTER — Telehealth: Payer: Self-pay | Admitting: Family Medicine

## 2019-10-16 NOTE — Telephone Encounter (Signed)
Patient daughter calling in because she states that her mother needs a wheel chair and [employer] will cover this but they would need an authorization from Dr. Yong Channel in order to get this wheelchair for patient.

## 2019-10-16 NOTE — Telephone Encounter (Signed)
[name] from [place] called in and stated that the daughter wanted to start PT tomorrow instead of today.

## 2019-10-17 DIAGNOSIS — M8588 Other specified disorders of bone density and structure, other site: Secondary | ICD-10-CM | POA: Diagnosis not present

## 2019-10-17 DIAGNOSIS — E785 Hyperlipidemia, unspecified: Secondary | ICD-10-CM | POA: Diagnosis not present

## 2019-10-17 DIAGNOSIS — W06XXXD Fall from bed, subsequent encounter: Secondary | ICD-10-CM | POA: Diagnosis not present

## 2019-10-17 DIAGNOSIS — N183 Chronic kidney disease, stage 3 unspecified: Secondary | ICD-10-CM | POA: Diagnosis not present

## 2019-10-17 DIAGNOSIS — I129 Hypertensive chronic kidney disease with stage 1 through stage 4 chronic kidney disease, or unspecified chronic kidney disease: Secondary | ICD-10-CM | POA: Diagnosis not present

## 2019-10-17 DIAGNOSIS — D329 Benign neoplasm of meninges, unspecified: Secondary | ICD-10-CM | POA: Diagnosis not present

## 2019-10-17 DIAGNOSIS — I4891 Unspecified atrial fibrillation: Secondary | ICD-10-CM | POA: Diagnosis not present

## 2019-10-17 DIAGNOSIS — Z8673 Personal history of transient ischemic attack (TIA), and cerebral infarction without residual deficits: Secondary | ICD-10-CM | POA: Diagnosis not present

## 2019-10-17 DIAGNOSIS — I680 Cerebral amyloid angiopathy: Secondary | ICD-10-CM | POA: Diagnosis not present

## 2019-10-17 DIAGNOSIS — M4802 Spinal stenosis, cervical region: Secondary | ICD-10-CM | POA: Diagnosis not present

## 2019-10-17 DIAGNOSIS — G252 Other specified forms of tremor: Secondary | ICD-10-CM | POA: Diagnosis not present

## 2019-10-17 DIAGNOSIS — S32592D Other specified fracture of left pubis, subsequent encounter for fracture with routine healing: Secondary | ICD-10-CM | POA: Diagnosis not present

## 2019-10-17 DIAGNOSIS — Z9181 History of falling: Secondary | ICD-10-CM | POA: Diagnosis not present

## 2019-10-17 DIAGNOSIS — E854 Organ-limited amyloidosis: Secondary | ICD-10-CM | POA: Diagnosis not present

## 2019-10-17 DIAGNOSIS — M1711 Unilateral primary osteoarthritis, right knee: Secondary | ICD-10-CM | POA: Diagnosis not present

## 2019-10-23 ENCOUNTER — Telehealth: Payer: Self-pay | Admitting: Family Medicine

## 2019-10-23 NOTE — Telephone Encounter (Signed)
[place] asked if we could send in the notes from the last visit with Dr.Hunter for billing purposes. [place]

## 2019-10-23 NOTE — Telephone Encounter (Signed)
Can try simply writing at risk for falls as diagnosis Z 91.81 ion prescription pad with request for wheelchair.  I will sign this-we can have follow-up visit if needed if any issues

## 2019-10-23 NOTE — Telephone Encounter (Signed)
You will need see in office to get note right.

## 2019-10-23 NOTE — Telephone Encounter (Signed)
Notes printed and faxed to [facility].

## 2019-10-24 DIAGNOSIS — E854 Organ-limited amyloidosis: Secondary | ICD-10-CM | POA: Diagnosis not present

## 2019-10-24 DIAGNOSIS — M4802 Spinal stenosis, cervical region: Secondary | ICD-10-CM | POA: Diagnosis not present

## 2019-10-24 DIAGNOSIS — I4891 Unspecified atrial fibrillation: Secondary | ICD-10-CM | POA: Diagnosis not present

## 2019-10-24 DIAGNOSIS — E785 Hyperlipidemia, unspecified: Secondary | ICD-10-CM | POA: Diagnosis not present

## 2019-10-24 DIAGNOSIS — I129 Hypertensive chronic kidney disease with stage 1 through stage 4 chronic kidney disease, or unspecified chronic kidney disease: Secondary | ICD-10-CM | POA: Diagnosis not present

## 2019-10-24 DIAGNOSIS — I680 Cerebral amyloid angiopathy: Secondary | ICD-10-CM | POA: Diagnosis not present

## 2019-10-24 DIAGNOSIS — Z9181 History of falling: Secondary | ICD-10-CM | POA: Diagnosis not present

## 2019-10-24 DIAGNOSIS — D329 Benign neoplasm of meninges, unspecified: Secondary | ICD-10-CM | POA: Diagnosis not present

## 2019-10-24 DIAGNOSIS — Z8673 Personal history of transient ischemic attack (TIA), and cerebral infarction without residual deficits: Secondary | ICD-10-CM | POA: Diagnosis not present

## 2019-10-24 DIAGNOSIS — M1711 Unilateral primary osteoarthritis, right knee: Secondary | ICD-10-CM | POA: Diagnosis not present

## 2019-10-24 DIAGNOSIS — N183 Chronic kidney disease, stage 3 unspecified: Secondary | ICD-10-CM | POA: Diagnosis not present

## 2019-10-24 DIAGNOSIS — S32592D Other specified fracture of left pubis, subsequent encounter for fracture with routine healing: Secondary | ICD-10-CM | POA: Diagnosis not present

## 2019-10-24 DIAGNOSIS — M8588 Other specified disorders of bone density and structure, other site: Secondary | ICD-10-CM | POA: Diagnosis not present

## 2019-10-24 DIAGNOSIS — G252 Other specified forms of tremor: Secondary | ICD-10-CM | POA: Diagnosis not present

## 2019-10-24 DIAGNOSIS — W06XXXD Fall from bed, subsequent encounter: Secondary | ICD-10-CM | POA: Diagnosis not present

## 2019-10-25 ENCOUNTER — Other Ambulatory Visit: Payer: Self-pay

## 2019-10-25 DIAGNOSIS — Z9181 History of falling: Secondary | ICD-10-CM

## 2019-10-25 NOTE — Telephone Encounter (Signed)
Called and spoke with pt daughter and she is going to call [facility] again to see what the proper procedure is for this as well as Tour manager.

## 2019-10-25 NOTE — Telephone Encounter (Signed)
Called [employer] and got the wheelchair transportation approved. Called and lm on pt daughter vm to confirm.

## 2019-10-25 NOTE — Telephone Encounter (Signed)
Order placed sent message to see what steps I need to do next. Have printed copy of order that I am holding for next steps.

## 2019-10-26 DIAGNOSIS — M8588 Other specified disorders of bone density and structure, other site: Secondary | ICD-10-CM | POA: Diagnosis not present

## 2019-10-26 DIAGNOSIS — D329 Benign neoplasm of meninges, unspecified: Secondary | ICD-10-CM | POA: Diagnosis not present

## 2019-10-26 DIAGNOSIS — I4891 Unspecified atrial fibrillation: Secondary | ICD-10-CM | POA: Diagnosis not present

## 2019-10-26 DIAGNOSIS — I129 Hypertensive chronic kidney disease with stage 1 through stage 4 chronic kidney disease, or unspecified chronic kidney disease: Secondary | ICD-10-CM | POA: Diagnosis not present

## 2019-10-26 DIAGNOSIS — E854 Organ-limited amyloidosis: Secondary | ICD-10-CM | POA: Diagnosis not present

## 2019-10-26 DIAGNOSIS — W06XXXD Fall from bed, subsequent encounter: Secondary | ICD-10-CM | POA: Diagnosis not present

## 2019-10-26 DIAGNOSIS — G252 Other specified forms of tremor: Secondary | ICD-10-CM | POA: Diagnosis not present

## 2019-10-26 DIAGNOSIS — N183 Chronic kidney disease, stage 3 unspecified: Secondary | ICD-10-CM | POA: Diagnosis not present

## 2019-10-26 DIAGNOSIS — S32592D Other specified fracture of left pubis, subsequent encounter for fracture with routine healing: Secondary | ICD-10-CM | POA: Diagnosis not present

## 2019-10-26 DIAGNOSIS — Z8673 Personal history of transient ischemic attack (TIA), and cerebral infarction without residual deficits: Secondary | ICD-10-CM | POA: Diagnosis not present

## 2019-10-26 DIAGNOSIS — Z9181 History of falling: Secondary | ICD-10-CM | POA: Diagnosis not present

## 2019-10-26 DIAGNOSIS — M4802 Spinal stenosis, cervical region: Secondary | ICD-10-CM | POA: Diagnosis not present

## 2019-10-26 DIAGNOSIS — E785 Hyperlipidemia, unspecified: Secondary | ICD-10-CM | POA: Diagnosis not present

## 2019-10-26 DIAGNOSIS — M1711 Unilateral primary osteoarthritis, right knee: Secondary | ICD-10-CM | POA: Diagnosis not present

## 2019-10-26 DIAGNOSIS — I680 Cerebral amyloid angiopathy: Secondary | ICD-10-CM | POA: Diagnosis not present

## 2019-10-30 DIAGNOSIS — I4891 Unspecified atrial fibrillation: Secondary | ICD-10-CM | POA: Diagnosis not present

## 2019-10-30 DIAGNOSIS — M8588 Other specified disorders of bone density and structure, other site: Secondary | ICD-10-CM | POA: Diagnosis not present

## 2019-10-30 DIAGNOSIS — M1711 Unilateral primary osteoarthritis, right knee: Secondary | ICD-10-CM | POA: Diagnosis not present

## 2019-10-30 DIAGNOSIS — W06XXXD Fall from bed, subsequent encounter: Secondary | ICD-10-CM | POA: Diagnosis not present

## 2019-10-30 DIAGNOSIS — E854 Organ-limited amyloidosis: Secondary | ICD-10-CM | POA: Diagnosis not present

## 2019-10-30 DIAGNOSIS — G252 Other specified forms of tremor: Secondary | ICD-10-CM | POA: Diagnosis not present

## 2019-10-30 DIAGNOSIS — E785 Hyperlipidemia, unspecified: Secondary | ICD-10-CM | POA: Diagnosis not present

## 2019-10-30 DIAGNOSIS — Z9181 History of falling: Secondary | ICD-10-CM | POA: Diagnosis not present

## 2019-10-30 DIAGNOSIS — I129 Hypertensive chronic kidney disease with stage 1 through stage 4 chronic kidney disease, or unspecified chronic kidney disease: Secondary | ICD-10-CM | POA: Diagnosis not present

## 2019-10-30 DIAGNOSIS — M4802 Spinal stenosis, cervical region: Secondary | ICD-10-CM | POA: Diagnosis not present

## 2019-10-30 DIAGNOSIS — D329 Benign neoplasm of meninges, unspecified: Secondary | ICD-10-CM | POA: Diagnosis not present

## 2019-10-30 DIAGNOSIS — S32592D Other specified fracture of left pubis, subsequent encounter for fracture with routine healing: Secondary | ICD-10-CM | POA: Diagnosis not present

## 2019-10-30 DIAGNOSIS — N183 Chronic kidney disease, stage 3 unspecified: Secondary | ICD-10-CM | POA: Diagnosis not present

## 2019-10-30 DIAGNOSIS — I680 Cerebral amyloid angiopathy: Secondary | ICD-10-CM | POA: Diagnosis not present

## 2019-10-30 DIAGNOSIS — Z8673 Personal history of transient ischemic attack (TIA), and cerebral infarction without residual deficits: Secondary | ICD-10-CM | POA: Diagnosis not present

## 2019-11-02 DIAGNOSIS — E854 Organ-limited amyloidosis: Secondary | ICD-10-CM | POA: Diagnosis not present

## 2019-11-02 DIAGNOSIS — S32592D Other specified fracture of left pubis, subsequent encounter for fracture with routine healing: Secondary | ICD-10-CM | POA: Diagnosis not present

## 2019-11-02 DIAGNOSIS — M8588 Other specified disorders of bone density and structure, other site: Secondary | ICD-10-CM | POA: Diagnosis not present

## 2019-11-02 DIAGNOSIS — I4891 Unspecified atrial fibrillation: Secondary | ICD-10-CM | POA: Diagnosis not present

## 2019-11-02 DIAGNOSIS — I129 Hypertensive chronic kidney disease with stage 1 through stage 4 chronic kidney disease, or unspecified chronic kidney disease: Secondary | ICD-10-CM | POA: Diagnosis not present

## 2019-11-02 DIAGNOSIS — G252 Other specified forms of tremor: Secondary | ICD-10-CM | POA: Diagnosis not present

## 2019-11-02 DIAGNOSIS — W06XXXD Fall from bed, subsequent encounter: Secondary | ICD-10-CM | POA: Diagnosis not present

## 2019-11-02 DIAGNOSIS — E785 Hyperlipidemia, unspecified: Secondary | ICD-10-CM | POA: Diagnosis not present

## 2019-11-02 DIAGNOSIS — M1711 Unilateral primary osteoarthritis, right knee: Secondary | ICD-10-CM | POA: Diagnosis not present

## 2019-11-02 DIAGNOSIS — M4802 Spinal stenosis, cervical region: Secondary | ICD-10-CM | POA: Diagnosis not present

## 2019-11-02 DIAGNOSIS — N183 Chronic kidney disease, stage 3 unspecified: Secondary | ICD-10-CM | POA: Diagnosis not present

## 2019-11-02 DIAGNOSIS — I680 Cerebral amyloid angiopathy: Secondary | ICD-10-CM | POA: Diagnosis not present

## 2019-11-02 DIAGNOSIS — D329 Benign neoplasm of meninges, unspecified: Secondary | ICD-10-CM | POA: Diagnosis not present

## 2019-11-02 DIAGNOSIS — Z8673 Personal history of transient ischemic attack (TIA), and cerebral infarction without residual deficits: Secondary | ICD-10-CM | POA: Diagnosis not present

## 2019-11-02 DIAGNOSIS — Z9181 History of falling: Secondary | ICD-10-CM | POA: Diagnosis not present

## 2019-11-07 DIAGNOSIS — D329 Benign neoplasm of meninges, unspecified: Secondary | ICD-10-CM | POA: Diagnosis not present

## 2019-11-07 DIAGNOSIS — M4802 Spinal stenosis, cervical region: Secondary | ICD-10-CM | POA: Diagnosis not present

## 2019-11-07 DIAGNOSIS — G252 Other specified forms of tremor: Secondary | ICD-10-CM | POA: Diagnosis not present

## 2019-11-07 DIAGNOSIS — M8588 Other specified disorders of bone density and structure, other site: Secondary | ICD-10-CM | POA: Diagnosis not present

## 2019-11-07 DIAGNOSIS — E785 Hyperlipidemia, unspecified: Secondary | ICD-10-CM | POA: Diagnosis not present

## 2019-11-07 DIAGNOSIS — I4891 Unspecified atrial fibrillation: Secondary | ICD-10-CM | POA: Diagnosis not present

## 2019-11-07 DIAGNOSIS — E854 Organ-limited amyloidosis: Secondary | ICD-10-CM | POA: Diagnosis not present

## 2019-11-07 DIAGNOSIS — S32592D Other specified fracture of left pubis, subsequent encounter for fracture with routine healing: Secondary | ICD-10-CM | POA: Diagnosis not present

## 2019-11-07 DIAGNOSIS — N183 Chronic kidney disease, stage 3 unspecified: Secondary | ICD-10-CM | POA: Diagnosis not present

## 2019-11-07 DIAGNOSIS — I680 Cerebral amyloid angiopathy: Secondary | ICD-10-CM | POA: Diagnosis not present

## 2019-11-07 DIAGNOSIS — Z9181 History of falling: Secondary | ICD-10-CM | POA: Diagnosis not present

## 2019-11-07 DIAGNOSIS — Z8673 Personal history of transient ischemic attack (TIA), and cerebral infarction without residual deficits: Secondary | ICD-10-CM | POA: Diagnosis not present

## 2019-11-07 DIAGNOSIS — W06XXXD Fall from bed, subsequent encounter: Secondary | ICD-10-CM | POA: Diagnosis not present

## 2019-11-07 DIAGNOSIS — M1711 Unilateral primary osteoarthritis, right knee: Secondary | ICD-10-CM | POA: Diagnosis not present

## 2019-11-07 DIAGNOSIS — I129 Hypertensive chronic kidney disease with stage 1 through stage 4 chronic kidney disease, or unspecified chronic kidney disease: Secondary | ICD-10-CM | POA: Diagnosis not present

## 2019-11-08 ENCOUNTER — Ambulatory Visit: Payer: Medicare Other | Attending: Internal Medicine

## 2019-11-08 DIAGNOSIS — Z23 Encounter for immunization: Secondary | ICD-10-CM

## 2019-11-08 NOTE — Progress Notes (Signed)
   Covid-19 Vaccination Clinic  Name:  [name]    MRN: [id number] DOB: Oct 09, 1917  11/08/2019  [name] was observed post Covid-19 immunization for 15 minutes without incident. She was provided with Vaccine Information Sheet and instruction to access the V-Safe system.   [name] was instructed to call 911 with any severe reactions post vaccine: Marland Kitchen Difficulty breathing  . Swelling of face and throat  . A fast heartbeat  . A bad rash all over body  . Dizziness and weakness   Immunizations Administered    Name Date Dose VIS Date Route   Pfizer COVID-19 Vaccine 11/08/2019 12:08 PM 0.3 mL 08/04/2019 Intramuscular   Manufacturer: [place]   Lot: [id number]   [place]: [id number]

## 2019-11-10 DIAGNOSIS — I680 Cerebral amyloid angiopathy: Secondary | ICD-10-CM | POA: Diagnosis not present

## 2019-11-10 DIAGNOSIS — E854 Organ-limited amyloidosis: Secondary | ICD-10-CM | POA: Diagnosis not present

## 2019-11-10 DIAGNOSIS — E785 Hyperlipidemia, unspecified: Secondary | ICD-10-CM | POA: Diagnosis not present

## 2019-11-10 DIAGNOSIS — M8588 Other specified disorders of bone density and structure, other site: Secondary | ICD-10-CM | POA: Diagnosis not present

## 2019-11-10 DIAGNOSIS — G252 Other specified forms of tremor: Secondary | ICD-10-CM | POA: Diagnosis not present

## 2019-11-10 DIAGNOSIS — N183 Chronic kidney disease, stage 3 unspecified: Secondary | ICD-10-CM | POA: Diagnosis not present

## 2019-11-10 DIAGNOSIS — Z8673 Personal history of transient ischemic attack (TIA), and cerebral infarction without residual deficits: Secondary | ICD-10-CM | POA: Diagnosis not present

## 2019-11-10 DIAGNOSIS — I4891 Unspecified atrial fibrillation: Secondary | ICD-10-CM | POA: Diagnosis not present

## 2019-11-10 DIAGNOSIS — M4802 Spinal stenosis, cervical region: Secondary | ICD-10-CM | POA: Diagnosis not present

## 2019-11-10 DIAGNOSIS — D329 Benign neoplasm of meninges, unspecified: Secondary | ICD-10-CM | POA: Diagnosis not present

## 2019-11-10 DIAGNOSIS — Z9181 History of falling: Secondary | ICD-10-CM | POA: Diagnosis not present

## 2019-11-10 DIAGNOSIS — S32592D Other specified fracture of left pubis, subsequent encounter for fracture with routine healing: Secondary | ICD-10-CM | POA: Diagnosis not present

## 2019-11-10 DIAGNOSIS — M1711 Unilateral primary osteoarthritis, right knee: Secondary | ICD-10-CM | POA: Diagnosis not present

## 2019-11-10 DIAGNOSIS — I129 Hypertensive chronic kidney disease with stage 1 through stage 4 chronic kidney disease, or unspecified chronic kidney disease: Secondary | ICD-10-CM | POA: Diagnosis not present

## 2019-11-10 DIAGNOSIS — W06XXXD Fall from bed, subsequent encounter: Secondary | ICD-10-CM | POA: Diagnosis not present

## 2019-11-13 DIAGNOSIS — M8588 Other specified disorders of bone density and structure, other site: Secondary | ICD-10-CM | POA: Diagnosis not present

## 2019-11-13 DIAGNOSIS — N183 Chronic kidney disease, stage 3 unspecified: Secondary | ICD-10-CM | POA: Diagnosis not present

## 2019-11-13 DIAGNOSIS — S32592D Other specified fracture of left pubis, subsequent encounter for fracture with routine healing: Secondary | ICD-10-CM | POA: Diagnosis not present

## 2019-11-13 DIAGNOSIS — G252 Other specified forms of tremor: Secondary | ICD-10-CM | POA: Diagnosis not present

## 2019-11-13 DIAGNOSIS — Z8673 Personal history of transient ischemic attack (TIA), and cerebral infarction without residual deficits: Secondary | ICD-10-CM | POA: Diagnosis not present

## 2019-11-13 DIAGNOSIS — W06XXXD Fall from bed, subsequent encounter: Secondary | ICD-10-CM | POA: Diagnosis not present

## 2019-11-13 DIAGNOSIS — E785 Hyperlipidemia, unspecified: Secondary | ICD-10-CM | POA: Diagnosis not present

## 2019-11-13 DIAGNOSIS — E854 Organ-limited amyloidosis: Secondary | ICD-10-CM | POA: Diagnosis not present

## 2019-11-13 DIAGNOSIS — I129 Hypertensive chronic kidney disease with stage 1 through stage 4 chronic kidney disease, or unspecified chronic kidney disease: Secondary | ICD-10-CM | POA: Diagnosis not present

## 2019-11-13 DIAGNOSIS — I4891 Unspecified atrial fibrillation: Secondary | ICD-10-CM | POA: Diagnosis not present

## 2019-11-13 DIAGNOSIS — M4802 Spinal stenosis, cervical region: Secondary | ICD-10-CM | POA: Diagnosis not present

## 2019-11-13 DIAGNOSIS — I680 Cerebral amyloid angiopathy: Secondary | ICD-10-CM | POA: Diagnosis not present

## 2019-11-13 DIAGNOSIS — M1711 Unilateral primary osteoarthritis, right knee: Secondary | ICD-10-CM | POA: Diagnosis not present

## 2019-11-13 DIAGNOSIS — Z9181 History of falling: Secondary | ICD-10-CM | POA: Diagnosis not present

## 2019-11-13 DIAGNOSIS — D329 Benign neoplasm of meninges, unspecified: Secondary | ICD-10-CM | POA: Diagnosis not present

## 2019-11-16 DIAGNOSIS — E785 Hyperlipidemia, unspecified: Secondary | ICD-10-CM | POA: Diagnosis not present

## 2019-11-16 DIAGNOSIS — M4802 Spinal stenosis, cervical region: Secondary | ICD-10-CM | POA: Diagnosis not present

## 2019-11-16 DIAGNOSIS — N183 Chronic kidney disease, stage 3 unspecified: Secondary | ICD-10-CM | POA: Diagnosis not present

## 2019-11-16 DIAGNOSIS — M8588 Other specified disorders of bone density and structure, other site: Secondary | ICD-10-CM | POA: Diagnosis not present

## 2019-11-16 DIAGNOSIS — I680 Cerebral amyloid angiopathy: Secondary | ICD-10-CM | POA: Diagnosis not present

## 2019-11-16 DIAGNOSIS — W06XXXD Fall from bed, subsequent encounter: Secondary | ICD-10-CM | POA: Diagnosis not present

## 2019-11-16 DIAGNOSIS — I129 Hypertensive chronic kidney disease with stage 1 through stage 4 chronic kidney disease, or unspecified chronic kidney disease: Secondary | ICD-10-CM | POA: Diagnosis not present

## 2019-11-16 DIAGNOSIS — S32592D Other specified fracture of left pubis, subsequent encounter for fracture with routine healing: Secondary | ICD-10-CM | POA: Diagnosis not present

## 2019-11-16 DIAGNOSIS — E854 Organ-limited amyloidosis: Secondary | ICD-10-CM | POA: Diagnosis not present

## 2019-11-16 DIAGNOSIS — M1711 Unilateral primary osteoarthritis, right knee: Secondary | ICD-10-CM | POA: Diagnosis not present

## 2019-11-16 DIAGNOSIS — D329 Benign neoplasm of meninges, unspecified: Secondary | ICD-10-CM | POA: Diagnosis not present

## 2019-11-16 DIAGNOSIS — Z8673 Personal history of transient ischemic attack (TIA), and cerebral infarction without residual deficits: Secondary | ICD-10-CM | POA: Diagnosis not present

## 2019-11-16 DIAGNOSIS — I4891 Unspecified atrial fibrillation: Secondary | ICD-10-CM | POA: Diagnosis not present

## 2019-11-16 DIAGNOSIS — G252 Other specified forms of tremor: Secondary | ICD-10-CM | POA: Diagnosis not present

## 2019-11-16 DIAGNOSIS — Z9181 History of falling: Secondary | ICD-10-CM | POA: Diagnosis not present

## 2019-11-23 ENCOUNTER — Emergency Department (HOSPITAL_COMMUNITY)
Admission: EM | Admit: 2019-11-23 | Discharge: 2019-11-23 | Disposition: A | Discharge status: Home / Self Care | Payer: Medicare Other | Attending: Emergency Medicine | Admitting: Emergency Medicine

## 2019-11-23 ENCOUNTER — Emergency Department (HOSPITAL_COMMUNITY): Payer: Medicare Other

## 2019-11-23 ENCOUNTER — Other Ambulatory Visit: Payer: Self-pay

## 2019-11-23 ENCOUNTER — Telehealth: Payer: Self-pay | Admitting: Family Medicine

## 2019-11-23 ENCOUNTER — Encounter (HOSPITAL_COMMUNITY): Payer: Self-pay

## 2019-11-23 DIAGNOSIS — R0902 Hypoxemia: Secondary | ICD-10-CM | POA: Diagnosis not present

## 2019-11-23 DIAGNOSIS — M25831 Other specified joint disorders, right wrist: Secondary | ICD-10-CM | POA: Insufficient documentation

## 2019-11-23 DIAGNOSIS — R0689 Other abnormalities of breathing: Secondary | ICD-10-CM | POA: Diagnosis not present

## 2019-11-23 DIAGNOSIS — I129 Hypertensive chronic kidney disease with stage 1 through stage 4 chronic kidney disease, or unspecified chronic kidney disease: Secondary | ICD-10-CM | POA: Insufficient documentation

## 2019-11-23 DIAGNOSIS — Z79899 Other long term (current) drug therapy: Secondary | ICD-10-CM | POA: Diagnosis not present

## 2019-11-23 DIAGNOSIS — M25539 Pain in unspecified wrist: Secondary | ICD-10-CM | POA: Diagnosis not present

## 2019-11-23 DIAGNOSIS — Z743 Need for continuous supervision: Secondary | ICD-10-CM | POA: Diagnosis not present

## 2019-11-23 DIAGNOSIS — M25531 Pain in right wrist: Secondary | ICD-10-CM | POA: Diagnosis not present

## 2019-11-23 DIAGNOSIS — N183 Chronic kidney disease, stage 3 unspecified: Secondary | ICD-10-CM | POA: Insufficient documentation

## 2019-11-23 DIAGNOSIS — R52 Pain, unspecified: Secondary | ICD-10-CM | POA: Diagnosis not present

## 2019-11-23 MED ORDER — MORPHINE SULFATE (PF) 2 MG/ML IV SOLN
2.0000 mg | Freq: Once | INTRAVENOUS | Status: AC
  Administered 2019-11-23: 12:00:00 2 mg via INTRAVENOUS
  Filled 2019-11-23: qty 1

## 2019-11-23 MED ORDER — ACETAMINOPHEN 325 MG PO TABS
650.0000 mg | ORAL_TABLET | Freq: Once | ORAL | Status: AC
  Administered 2019-11-23: 650 mg via ORAL
  Filled 2019-11-23: qty 2

## 2019-11-23 MED ORDER — ACETAMINOPHEN 500 MG PO TABS: 500.0000 mg | ORAL_TABLET | Freq: Four times a day (QID) | ORAL | 0 refills | Status: DC | PRN

## 2019-11-23 NOTE — ED Provider Notes (Signed)
  Face-to-face evaluation   History: She presents for evaluation of right wrist pain which she first noticed today and feels like might be aggravated from using a wheelchair, over the last 3 weeks.  She fell injuring her pelvis several weeks ago.  She is receiving home physical therapy, and improving walking now using all walker occasionally but mostly in a wheelchair.  Physical exam: Elderly female, alert and cooperative.  She is interactive and has reasonable understanding of her illness.  Right wrist tender and swollen primarily laterally.  No significant deformity or erythema.  Medical screening examination/treatment/procedure(s) were conducted as a shared visit with non-physician practitioner(s) and myself.  I personally evaluated the patient during the encounter    Daleen Bo, MD 11/24/19 [phone]

## 2019-11-23 NOTE — ED Triage Notes (Signed)
From home via EMS AOx4 & ambulatory Pain in R wrist started yesterday, no relief with tylenol or heating pack, woke up this AM with severe pain EMS noted good amount of swelling to wrist, patient denies falls or recent injury   100 mcg fentanyl  20 L AC  Daughter on the way

## 2019-11-23 NOTE — ED Provider Notes (Signed)
[facility] DEPT Provider Note   CSN: [id number] Arrival date & time: 11/23/19  1131     History No chief complaint on file.   [name] is a 86 y.o. female with history of hypertension, osteopenia, CVA, CKD, hyperlipidemia, atrial fibrillation, cerebral amyloid angiopathy presents brought in by EMS for evaluation of acute onset, progressively worsening right wrist pain beginning yesterday.  The patient notes no known history of recent trauma but states that she did injure the wrist after a fall approximately 10 years ago.  She reports severe pain to the wrist which worsens with any movement.  Denies numbness or tingling of the extremity.  She has been using a topical freeze spray and Tylenol with no relief.  Denies fevers, chest pain, shortness of breath, abdominal pain, nausea, or vomiting.  EMS administered a total of 100 mcg of fentanyl in a 25 mcg doses with some improvement in pain.  Also of note the patient was seen and evaluated on 10/11/2019 after a fall with radiograph suspicious for nondisplaced left superior and inferior pubic rami fractures which have left her confined to a wheelchair since then.  The history is provided by the patient and the EMS personnel.       Past Medical History:  Diagnosis Date  . Hypertension   . Osteopenia   . Stroke [facility])     Patient Active Problem List   Diagnosis Date Noted  . Chronic kidney disease, stage III (moderate) 08/01/2018  . Meningioma ([place]) 03/31/2018  . History of CVA in adulthood 03/31/2018  . Hyperlipidemia 03/15/2018  . Urinary incontinence 04/28/2017  . Reactive depression 08/14/2015  . Intention tremor 06/21/2015  . Cerebral amyloid angiopathy ([place]) 05/29/2015  . Atrial fibrillation ([place]) 03/29/2015  . History of intracerebral hemorrhage    . Dizzy spells 11/10/2014  . SPINAL STENOSIS, CERVICAL 06/11/2010  . Essential hypertension 06/22/2006  . Arthritis of right knee 06/22/2006  .  Osteopenia 06/22/2006    Past Surgical History:  Procedure Laterality Date  . CATARACT EXTRACTION       OB History   No obstetric history on file.     Family History  Problem Relation Age of Onset  . Cancer Daughter        breast    Social History   Tobacco Use  . Smoking status: Never Smoker  . Smokeless tobacco: Never Used  Substance Use Topics  . Alcohol use: Yes    Alcohol/week: 1.0 standard drinks    Types: 1 Glasses of wine per week    Comment: 1 glass with dinner  . Drug use: No    Home Medications Prior to Admission medications   Medication Sig Start Date End Date Taking? Authorizing Provider  amLODipine (NORVASC) 5 MG tablet Take 1 tablet (5 mg total) by mouth daily. 07/28/19  Yes Marin Olp, MD  atorvastatin (LIPITOR) 40 MG tablet Take 1 tablet (40 mg total) by mouth at bedtime. 07/28/19  Yes Marin Olp, MD  losartan (COZAAR) 100 MG tablet TAKE 1 TABLET BY MOUTH  DAILY Patient taking differently: Take 100 mg by mouth at bedtime.  10/02/19  Yes Marin Olp, MD  Multiple Vitamin (MULTIVITAMIN) tablet Take 1 tablet by mouth daily.     Yes [provider]  acetaminophen (TYLENOL) 500 MG tablet Take 1 tablet (500 mg total) by mouth every 6 (six) hours as needed. 11/23/19   Nils Flack, Kurstyn Larios A, PA-C    Allergies    Ace  inhibitors  Review of Systems   Review of Systems  Physical Exam Updated Vital Signs BP (!) 155/75   Pulse 67   Temp (!) 97.4 F (36.3 C) (Oral)   Resp 16   Ht 5' (1.524 m)   Wt 54.4 kg   SpO2 93%   BMI 23.44 kg/m   Physical Exam Vitals and nursing note reviewed.  Constitutional:      General: She is not in acute distress.    Appearance: She is well-developed.  HENT:     Head: Normocephalic and atraumatic.  Eyes:     General:        Right eye: No discharge.        Left eye: No discharge.     Conjunctiva/sclera: Conjunctivae normal.  Neck:     Vascular: No JVD.     Trachea: No tracheal deviation.    Cardiovascular:     Rate and Rhythm: Normal rate.     Pulses: Normal pulses.     Comments: 2+ radial pulses bilaterally Pulmonary:     Effort: Pulmonary effort is normal.  Abdominal:     General: There is no distension.  Musculoskeletal:        General: Swelling and tenderness present.     Comments: Significant tenderness to palpation overlying the distal ulna and ulnar styloid.  There is some overlying swelling but no erythema or warmth.  Severely limited active range of motion of the wrist.  Decreased grip strength due to wrist pain.  No tenderness to palpation of the proximal right forearm or elbow.  Skin:    General: Skin is warm and dry.     Capillary Refill: Capillary refill takes less than 2 seconds.     Findings: No erythema.  Neurological:     Mental Status: She is alert.     Comments: Sensation intact to light touch of bilateral upper extremities.  Psychiatric:        Behavior: Behavior normal.     ED Results / Procedures / Treatments   Labs (all labs ordered are listed, but only abnormal results are displayed) Labs Reviewed - No data to display  EKG None  Radiology DG Wrist Complete Right  Result Date: 11/23/2019 CLINICAL DATA:  Onset right wrist pain yesterday.  No known injury. EXAM: RIGHT WRIST - COMPLETE 3+ VIEW COMPARISON:  None. FINDINGS: They are is no acute bony or joint abnormality. The patient has ulnar positive variance with sclerosis in the ulna and adjacent lunate and cystic change in the adjacent lunate consistent with ulnocarpal abutment. Degenerative disease at the first [facility] and scaphoid trapezium trapezoid joints is identified. There is also degenerative change at the first MCP joint. Soft tissues about the wrist appear swollen. IMPRESSION: Soft tissue swelling without underlying acute bony or joint abnormality. Findings consistent with ulnocarpal abutment. First MCP, first [place] and STT osteoarthritis. Electronically Signed   By: Inge Rise M.D.    On: 11/23/2019 12:22    Procedures Procedures (including critical care time)  Medications Ordered in ED Medications  morphine 2 MG/ML injection 2 mg (2 mg Intravenous Given 11/23/19 1219)    ED Course  I have reviewed the triage vital signs and the nursing notes.  Pertinent labs & imaging results that were available during my care of the patient were reviewed by me and considered in my medical decision making (see chart for details).    MDM Rules/Calculators/A&P  Patient presenting for evaluation of sudden onset right wrist pain since yesterday.  No known injury or trauma but she has been using her wheelchair for the last month after a fall with pubic rami fractures and so I suspect that she is using that wrist more and in an extended position.  She is neurovascularly intact, afebrile and vital signs are otherwise stable.  She is nontoxic in appearance but uncomfortable.  No erythema or warmth or constitutional symptoms to suggest septic arthritis.  Radiographs suggest ulnocarpal abutment which is consistent with her physical examination no focal ulnar wrist pain.  Doubt osteomyelitis, gout, or DVT.  Conservative therapy indicated and discussed with patient and her daughter by Dr. Eulis Foster.  We will place her in a removable Velcro wrist splint discussed the utility of heat and physical therapy and Tylenol.  Her physical therapist is visiting her at home tomorrow.  They are planning to eventually wean her off of the wheelchair entirely and she has been able to bear weight on the extremity since her fall.  Recommend follow-up with orthopedist on an outpatient basis as well.  Discussed strict ED return precautions.  Patient and daughter verbalized understanding of and agreement with plan and patient is stable for discharge home at this time. Patient seen and evaluated by Dr. Eulis Foster who agrees with assessment and plan at this time.    Final Clinical Impression(s) / ED  Diagnoses Final diagnoses:  Ulnocarpal abutment syndrome, right    Rx / DC Orders ED Discharge Orders         Ordered    acetaminophen (TYLENOL) 500 MG tablet  Every 6 hours PRN     11/23/19 1341           Renita Papa, PA-C 11/23/19 1353    Daleen Bo, MD 11/24/19 [phone]

## 2019-11-23 NOTE — ED Notes (Signed)
Ortho tech called at this time.  

## 2019-11-23 NOTE — Telephone Encounter (Signed)
Nurse Assessment Nurse: Rolena Infante, RN, Patrice Date/Time (Eastern Time): 11/23/2019 9:35:31 AM Confirm and document reason for call. If symptomatic, describe symptoms. ---Caller states her mother has rt hand pain. Started yesterday with pain but this am pain has increased. No history of injury Using her hands a lot due to using wheelchair a lot. Gave Tylenol this am. No swelling. Has the patient had close contact with a person known or suspected to have the novel coronavirus illness OR traveled / lives in area with major community spread (including international travel) in the last 14 days from the onset of symptoms? * If Asymptomatic, screen for exposure and travel within the last 14 days. ---No Does the patient have any new or worsening symptoms? ---Yes Will a triage be completed? ---Yes Related visit to physician within the last 2 weeks? ---No Does the PT have any chronic conditions? (i.e. diabetes, asthma, this includes High risk factors for pregnancy, etc.) ---Yes List chronic conditions. ---HTN, Arthritis, A-Fib Is this a behavioral health or substance abuse call? ---No Guidelines Guideline Title Affirmed Question Affirmed Notes Nurse Date/Time (Eastern Time) Hand and Wrist Pain [1] SEVERE pain (e.g., excruciating, unable to use hand at all) Aspers, RN, Patrice 11/23/2019 9:39:54 AMPLEASE NOTE: All timestamps contained within this report are represented as [place] Standard Time. CONFIDENTIALTY NOTICE: This fax transmission is intended only for the addressee. It contains information that is legally privileged, confidential or otherwise protected from use or disclosure. If you are not the intended recipient, you are strictly prohibited from reviewing, disclosing, copying using or disseminating any of this information or taking any action in reliance on or regarding this information. If you have received this fax in error, please notify us immediately by telephone so that we can  arrange for its return to [place]. Phone: [phone], Toll-Free: [phone], Fax: [phone] Page: 2 of 2 Call Id: [id number] Guidelines Guideline Title Affirmed Question Affirmed Notes Nurse Date/Time Eilene Ghazi Time) [2] not improved after 2 hours of pain medicine Disp. Time Eilene Ghazi Time) Disposition Final User 11/23/2019 9:16:06 AM Attempt made - message left Rolena Infante, RN, Patrice 11/23/2019 9:45:13 AM See HCP within 4 Hours (or PCP triage) Yes Rolena Infante, RN, Patrice Caller Disagree/Comply Comply Caller Understands Yes PreDisposition Call Doctor Care Advice Given Per Guideline SEE HCP WITHIN 4 HOURS (OR PCP TRIAGE): * IF OFFICE WILL BE OPEN: You need to be seen within the next 3 or 4 hours. Call your doctor (or NP/PA) now or as soon as the office opens. PAIN MEDICINES: BRING MEDICINES: * Please bring a list of your current medicines when you go to see the doctor. CALL BACK IF: * You become worse. CARE ADVICE given per Hand and Wrist Pain (Adult) guideline. Comments User: Donnelly Angelica, RN Date/Time Eilene Ghazi Time): 11/23/2019 9:46:15 AM Warm transfer to Benjie Karvonen at the office to get an appointment. Pt's info given along with outcome. Referrals Warm transfer to backlin

## 2019-11-23 NOTE — Telephone Encounter (Signed)
Second message on this have sent phone note to provider

## 2019-11-23 NOTE — ED Notes (Signed)
Pure wick has been placed. Suction set to 45mmHg.  

## 2019-11-23 NOTE — Discharge Instructions (Signed)
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  You can take 1 to 2 tablets of Tylenol every 6 hours as needed for pain.  Do not exceed 4000 mg of Tylenol daily.  Apply heat 20 minutes at a time a few times daily as needed for pain.  Wear the wrist splint as needed. You will have decreased use of the wrist while wearing the splint (rest will help ease the pain), so be aware to avoid any activities where your balance may be affected such as walking upstairs.  Follow-up with your orthopedist and physical therapist for reevaluation of symptoms.  I have attached information for the orthopedist on call.  Return to the emergency department if any concerning signs or symptoms develop such as fevers, severe swelling, weakness, loss of pulses or severe uncontrolled pain.

## 2019-11-23 NOTE — Telephone Encounter (Signed)
Pt home health physical therapist called and stated pt missed PT appointment today due to going ED for wrist pain. Was unable to get physical therapist information before she hung up.

## 2019-11-23 NOTE — Progress Notes (Signed)
Orthopedic Tech Progress Note Patient Details:  [name] [facility] Jun 24, 1918 [id number]  Ortho Devices Type of Ortho Device: Wrist splint Ortho Device/Splint Location: RUE Ortho Device/Splint Interventions: Ordered, Application, Adjustment   Post Interventions Patient Tolerated: Fair Instructions Provided: Care of device, Adjustment of device   [name] N [name] 11/23/2019, 2:21 PM

## 2019-11-23 NOTE — Telephone Encounter (Signed)
Patient was seen in ED. 

## 2019-11-24 DIAGNOSIS — N183 Chronic kidney disease, stage 3 unspecified: Secondary | ICD-10-CM | POA: Diagnosis not present

## 2019-11-24 DIAGNOSIS — M1711 Unilateral primary osteoarthritis, right knee: Secondary | ICD-10-CM | POA: Diagnosis not present

## 2019-11-24 DIAGNOSIS — D329 Benign neoplasm of meninges, unspecified: Secondary | ICD-10-CM | POA: Diagnosis not present

## 2019-11-24 DIAGNOSIS — E785 Hyperlipidemia, unspecified: Secondary | ICD-10-CM | POA: Diagnosis not present

## 2019-11-24 DIAGNOSIS — I129 Hypertensive chronic kidney disease with stage 1 through stage 4 chronic kidney disease, or unspecified chronic kidney disease: Secondary | ICD-10-CM | POA: Diagnosis not present

## 2019-11-24 DIAGNOSIS — M8588 Other specified disorders of bone density and structure, other site: Secondary | ICD-10-CM | POA: Diagnosis not present

## 2019-11-24 DIAGNOSIS — I4891 Unspecified atrial fibrillation: Secondary | ICD-10-CM | POA: Diagnosis not present

## 2019-11-24 DIAGNOSIS — S32592D Other specified fracture of left pubis, subsequent encounter for fracture with routine healing: Secondary | ICD-10-CM | POA: Diagnosis not present

## 2019-11-24 DIAGNOSIS — G252 Other specified forms of tremor: Secondary | ICD-10-CM | POA: Diagnosis not present

## 2019-11-24 DIAGNOSIS — I680 Cerebral amyloid angiopathy: Secondary | ICD-10-CM | POA: Diagnosis not present

## 2019-11-24 DIAGNOSIS — W06XXXD Fall from bed, subsequent encounter: Secondary | ICD-10-CM | POA: Diagnosis not present

## 2019-11-24 DIAGNOSIS — E854 Organ-limited amyloidosis: Secondary | ICD-10-CM | POA: Diagnosis not present

## 2019-11-24 DIAGNOSIS — Z8673 Personal history of transient ischemic attack (TIA), and cerebral infarction without residual deficits: Secondary | ICD-10-CM | POA: Diagnosis not present

## 2019-11-24 DIAGNOSIS — Z9181 History of falling: Secondary | ICD-10-CM | POA: Diagnosis not present

## 2019-11-24 DIAGNOSIS — M4802 Spinal stenosis, cervical region: Secondary | ICD-10-CM | POA: Diagnosis not present

## 2019-11-29 DIAGNOSIS — E785 Hyperlipidemia, unspecified: Secondary | ICD-10-CM | POA: Diagnosis not present

## 2019-11-29 DIAGNOSIS — N183 Chronic kidney disease, stage 3 unspecified: Secondary | ICD-10-CM | POA: Diagnosis not present

## 2019-11-29 DIAGNOSIS — I4891 Unspecified atrial fibrillation: Secondary | ICD-10-CM | POA: Diagnosis not present

## 2019-11-29 DIAGNOSIS — W06XXXD Fall from bed, subsequent encounter: Secondary | ICD-10-CM | POA: Diagnosis not present

## 2019-11-29 DIAGNOSIS — Z9181 History of falling: Secondary | ICD-10-CM | POA: Diagnosis not present

## 2019-11-29 DIAGNOSIS — M4802 Spinal stenosis, cervical region: Secondary | ICD-10-CM | POA: Diagnosis not present

## 2019-11-29 DIAGNOSIS — S32592D Other specified fracture of left pubis, subsequent encounter for fracture with routine healing: Secondary | ICD-10-CM | POA: Diagnosis not present

## 2019-11-29 DIAGNOSIS — D329 Benign neoplasm of meninges, unspecified: Secondary | ICD-10-CM | POA: Diagnosis not present

## 2019-11-29 DIAGNOSIS — G252 Other specified forms of tremor: Secondary | ICD-10-CM | POA: Diagnosis not present

## 2019-11-29 DIAGNOSIS — Z8673 Personal history of transient ischemic attack (TIA), and cerebral infarction without residual deficits: Secondary | ICD-10-CM | POA: Diagnosis not present

## 2019-11-29 DIAGNOSIS — E854 Organ-limited amyloidosis: Secondary | ICD-10-CM | POA: Diagnosis not present

## 2019-11-29 DIAGNOSIS — I680 Cerebral amyloid angiopathy: Secondary | ICD-10-CM | POA: Diagnosis not present

## 2019-11-29 DIAGNOSIS — M1711 Unilateral primary osteoarthritis, right knee: Secondary | ICD-10-CM | POA: Diagnosis not present

## 2019-11-29 DIAGNOSIS — I129 Hypertensive chronic kidney disease with stage 1 through stage 4 chronic kidney disease, or unspecified chronic kidney disease: Secondary | ICD-10-CM | POA: Diagnosis not present

## 2019-11-29 DIAGNOSIS — M8588 Other specified disorders of bone density and structure, other site: Secondary | ICD-10-CM | POA: Diagnosis not present

## 2019-12-01 DIAGNOSIS — D329 Benign neoplasm of meninges, unspecified: Secondary | ICD-10-CM | POA: Diagnosis not present

## 2019-12-01 DIAGNOSIS — I129 Hypertensive chronic kidney disease with stage 1 through stage 4 chronic kidney disease, or unspecified chronic kidney disease: Secondary | ICD-10-CM | POA: Diagnosis not present

## 2019-12-01 DIAGNOSIS — E785 Hyperlipidemia, unspecified: Secondary | ICD-10-CM | POA: Diagnosis not present

## 2019-12-01 DIAGNOSIS — M4802 Spinal stenosis, cervical region: Secondary | ICD-10-CM | POA: Diagnosis not present

## 2019-12-01 DIAGNOSIS — W06XXXD Fall from bed, subsequent encounter: Secondary | ICD-10-CM | POA: Diagnosis not present

## 2019-12-01 DIAGNOSIS — S32592D Other specified fracture of left pubis, subsequent encounter for fracture with routine healing: Secondary | ICD-10-CM | POA: Diagnosis not present

## 2019-12-01 DIAGNOSIS — G252 Other specified forms of tremor: Secondary | ICD-10-CM | POA: Diagnosis not present

## 2019-12-01 DIAGNOSIS — M1711 Unilateral primary osteoarthritis, right knee: Secondary | ICD-10-CM | POA: Diagnosis not present

## 2019-12-01 DIAGNOSIS — Z8673 Personal history of transient ischemic attack (TIA), and cerebral infarction without residual deficits: Secondary | ICD-10-CM | POA: Diagnosis not present

## 2019-12-01 DIAGNOSIS — N183 Chronic kidney disease, stage 3 unspecified: Secondary | ICD-10-CM | POA: Diagnosis not present

## 2019-12-01 DIAGNOSIS — I4891 Unspecified atrial fibrillation: Secondary | ICD-10-CM | POA: Diagnosis not present

## 2019-12-01 DIAGNOSIS — E854 Organ-limited amyloidosis: Secondary | ICD-10-CM | POA: Diagnosis not present

## 2019-12-01 DIAGNOSIS — I680 Cerebral amyloid angiopathy: Secondary | ICD-10-CM | POA: Diagnosis not present

## 2019-12-01 DIAGNOSIS — M8588 Other specified disorders of bone density and structure, other site: Secondary | ICD-10-CM | POA: Diagnosis not present

## 2019-12-01 DIAGNOSIS — Z9181 History of falling: Secondary | ICD-10-CM | POA: Diagnosis not present

## 2019-12-05 DIAGNOSIS — D329 Benign neoplasm of meninges, unspecified: Secondary | ICD-10-CM | POA: Diagnosis not present

## 2019-12-05 DIAGNOSIS — I129 Hypertensive chronic kidney disease with stage 1 through stage 4 chronic kidney disease, or unspecified chronic kidney disease: Secondary | ICD-10-CM | POA: Diagnosis not present

## 2019-12-05 DIAGNOSIS — S32592D Other specified fracture of left pubis, subsequent encounter for fracture with routine healing: Secondary | ICD-10-CM | POA: Diagnosis not present

## 2019-12-05 DIAGNOSIS — G252 Other specified forms of tremor: Secondary | ICD-10-CM | POA: Diagnosis not present

## 2019-12-05 DIAGNOSIS — M4802 Spinal stenosis, cervical region: Secondary | ICD-10-CM | POA: Diagnosis not present

## 2019-12-05 DIAGNOSIS — Z8673 Personal history of transient ischemic attack (TIA), and cerebral infarction without residual deficits: Secondary | ICD-10-CM | POA: Diagnosis not present

## 2019-12-05 DIAGNOSIS — W06XXXD Fall from bed, subsequent encounter: Secondary | ICD-10-CM | POA: Diagnosis not present

## 2019-12-05 DIAGNOSIS — E785 Hyperlipidemia, unspecified: Secondary | ICD-10-CM | POA: Diagnosis not present

## 2019-12-05 DIAGNOSIS — M8588 Other specified disorders of bone density and structure, other site: Secondary | ICD-10-CM | POA: Diagnosis not present

## 2019-12-05 DIAGNOSIS — I680 Cerebral amyloid angiopathy: Secondary | ICD-10-CM | POA: Diagnosis not present

## 2019-12-05 DIAGNOSIS — E854 Organ-limited amyloidosis: Secondary | ICD-10-CM | POA: Diagnosis not present

## 2019-12-05 DIAGNOSIS — M1711 Unilateral primary osteoarthritis, right knee: Secondary | ICD-10-CM | POA: Diagnosis not present

## 2019-12-05 DIAGNOSIS — N183 Chronic kidney disease, stage 3 unspecified: Secondary | ICD-10-CM | POA: Diagnosis not present

## 2019-12-05 DIAGNOSIS — Z9181 History of falling: Secondary | ICD-10-CM | POA: Diagnosis not present

## 2019-12-05 DIAGNOSIS — I4891 Unspecified atrial fibrillation: Secondary | ICD-10-CM | POA: Diagnosis not present

## 2019-12-07 DIAGNOSIS — I129 Hypertensive chronic kidney disease with stage 1 through stage 4 chronic kidney disease, or unspecified chronic kidney disease: Secondary | ICD-10-CM | POA: Diagnosis not present

## 2019-12-07 DIAGNOSIS — I680 Cerebral amyloid angiopathy: Secondary | ICD-10-CM | POA: Diagnosis not present

## 2019-12-07 DIAGNOSIS — E785 Hyperlipidemia, unspecified: Secondary | ICD-10-CM | POA: Diagnosis not present

## 2019-12-07 DIAGNOSIS — G252 Other specified forms of tremor: Secondary | ICD-10-CM | POA: Diagnosis not present

## 2019-12-07 DIAGNOSIS — I4891 Unspecified atrial fibrillation: Secondary | ICD-10-CM | POA: Diagnosis not present

## 2019-12-07 DIAGNOSIS — S32592D Other specified fracture of left pubis, subsequent encounter for fracture with routine healing: Secondary | ICD-10-CM | POA: Diagnosis not present

## 2019-12-07 DIAGNOSIS — M1711 Unilateral primary osteoarthritis, right knee: Secondary | ICD-10-CM | POA: Diagnosis not present

## 2019-12-07 DIAGNOSIS — W06XXXD Fall from bed, subsequent encounter: Secondary | ICD-10-CM | POA: Diagnosis not present

## 2019-12-07 DIAGNOSIS — Z8673 Personal history of transient ischemic attack (TIA), and cerebral infarction without residual deficits: Secondary | ICD-10-CM | POA: Diagnosis not present

## 2019-12-07 DIAGNOSIS — E854 Organ-limited amyloidosis: Secondary | ICD-10-CM | POA: Diagnosis not present

## 2019-12-07 DIAGNOSIS — D329 Benign neoplasm of meninges, unspecified: Secondary | ICD-10-CM | POA: Diagnosis not present

## 2019-12-07 DIAGNOSIS — M4802 Spinal stenosis, cervical region: Secondary | ICD-10-CM | POA: Diagnosis not present

## 2019-12-07 DIAGNOSIS — Z9181 History of falling: Secondary | ICD-10-CM | POA: Diagnosis not present

## 2019-12-07 DIAGNOSIS — N183 Chronic kidney disease, stage 3 unspecified: Secondary | ICD-10-CM | POA: Diagnosis not present

## 2019-12-07 DIAGNOSIS — M8588 Other specified disorders of bone density and structure, other site: Secondary | ICD-10-CM | POA: Diagnosis not present

## 2019-12-08 ENCOUNTER — Encounter: Payer: Medicare Other | Admitting: Family Medicine

## 2019-12-11 ENCOUNTER — Ambulatory Visit: Payer: Medicare Other | Admitting: Podiatry

## 2019-12-11 DIAGNOSIS — I129 Hypertensive chronic kidney disease with stage 1 through stage 4 chronic kidney disease, or unspecified chronic kidney disease: Secondary | ICD-10-CM | POA: Diagnosis not present

## 2019-12-11 DIAGNOSIS — N183 Chronic kidney disease, stage 3 unspecified: Secondary | ICD-10-CM | POA: Diagnosis not present

## 2019-12-11 DIAGNOSIS — E854 Organ-limited amyloidosis: Secondary | ICD-10-CM | POA: Diagnosis not present

## 2019-12-11 DIAGNOSIS — G252 Other specified forms of tremor: Secondary | ICD-10-CM | POA: Diagnosis not present

## 2019-12-11 DIAGNOSIS — Z9181 History of falling: Secondary | ICD-10-CM | POA: Diagnosis not present

## 2019-12-11 DIAGNOSIS — M1711 Unilateral primary osteoarthritis, right knee: Secondary | ICD-10-CM | POA: Diagnosis not present

## 2019-12-11 DIAGNOSIS — M4802 Spinal stenosis, cervical region: Secondary | ICD-10-CM | POA: Diagnosis not present

## 2019-12-11 DIAGNOSIS — S32592D Other specified fracture of left pubis, subsequent encounter for fracture with routine healing: Secondary | ICD-10-CM | POA: Diagnosis not present

## 2019-12-11 DIAGNOSIS — W06XXXD Fall from bed, subsequent encounter: Secondary | ICD-10-CM | POA: Diagnosis not present

## 2019-12-11 DIAGNOSIS — E785 Hyperlipidemia, unspecified: Secondary | ICD-10-CM | POA: Diagnosis not present

## 2019-12-11 DIAGNOSIS — I4891 Unspecified atrial fibrillation: Secondary | ICD-10-CM | POA: Diagnosis not present

## 2019-12-11 DIAGNOSIS — M8588 Other specified disorders of bone density and structure, other site: Secondary | ICD-10-CM | POA: Diagnosis not present

## 2019-12-11 DIAGNOSIS — D329 Benign neoplasm of meninges, unspecified: Secondary | ICD-10-CM | POA: Diagnosis not present

## 2019-12-11 DIAGNOSIS — I680 Cerebral amyloid angiopathy: Secondary | ICD-10-CM | POA: Diagnosis not present

## 2019-12-11 DIAGNOSIS — Z8673 Personal history of transient ischemic attack (TIA), and cerebral infarction without residual deficits: Secondary | ICD-10-CM | POA: Diagnosis not present

## 2019-12-19 ENCOUNTER — Ambulatory Visit: Payer: Medicare Other | Admitting: Podiatry

## 2019-12-21 ENCOUNTER — Other Ambulatory Visit: Payer: Self-pay

## 2019-12-21 ENCOUNTER — Ambulatory Visit (INDEPENDENT_AMBULATORY_CARE_PROVIDER_SITE_OTHER): Payer: Medicare Other | Admitting: Family Medicine

## 2019-12-21 ENCOUNTER — Encounter: Payer: Self-pay | Admitting: Family Medicine

## 2019-12-21 VITALS — BP 126/78 | HR 63 | Temp 98.4°F | Ht 60.0 in | Wt 128.4 lb

## 2019-12-21 DIAGNOSIS — I10 Essential (primary) hypertension: Secondary | ICD-10-CM | POA: Diagnosis not present

## 2019-12-21 DIAGNOSIS — E854 Organ-limited amyloidosis: Secondary | ICD-10-CM | POA: Diagnosis not present

## 2019-12-21 DIAGNOSIS — Z9181 History of falling: Secondary | ICD-10-CM

## 2019-12-21 DIAGNOSIS — Z0000 Encounter for general adult medical examination without abnormal findings: Secondary | ICD-10-CM

## 2019-12-21 DIAGNOSIS — I680 Cerebral amyloid angiopathy: Secondary | ICD-10-CM

## 2019-12-21 DIAGNOSIS — D329 Benign neoplasm of meninges, unspecified: Secondary | ICD-10-CM

## 2019-12-21 DIAGNOSIS — Z780 Asymptomatic menopausal state: Secondary | ICD-10-CM

## 2019-12-21 DIAGNOSIS — E785 Hyperlipidemia, unspecified: Secondary | ICD-10-CM

## 2019-12-21 DIAGNOSIS — I4891 Unspecified atrial fibrillation: Secondary | ICD-10-CM

## 2019-12-21 DIAGNOSIS — R5383 Other fatigue: Secondary | ICD-10-CM | POA: Diagnosis not present

## 2019-12-21 DIAGNOSIS — G44329 Chronic post-traumatic headache, not intractable: Secondary | ICD-10-CM

## 2019-12-21 NOTE — Patient Instructions (Addendum)
Head Pain: We will order a CT today.  You should get a call from our office tomorrow with an appointment date and time. If you do not get a call please call our office.     Your blood pressure looks amazing in office today. Continue medication and checking blood pressure at home.   If you decide to get the Shingles vaccination you can get that at your local pharmacy.   We have also ordered a bone density. This will check to make sure your bones have not gotten weak. You will need to call back to our office to make that appointment.

## 2019-12-21 NOTE — Progress Notes (Signed)
Phone: [phone]   Subjective:  Patient presents today for their annual physical. Chief complaint-noted.   See problem oriented charting- ROS- full  review of systems was completed and negative except for: left headache, dysarthria, stress  The following were reviewed and entered/updated in epic: Past Medical History:  Diagnosis Date  . Hypertension   . Osteopenia   . Stroke [facility])    Patient Active Problem List   Diagnosis Date Noted  . Meningioma ([place]) 03/31/2018    Priority: High  . History of CVA in adulthood 03/31/2018    Priority: High  . Cerebral amyloid angiopathy ([place]) 05/29/2015    Priority: High  . Atrial fibrillation ([place]) 03/29/2015    Priority: High  . History of intracerebral hemorrhage      Priority: High  . Chronic kidney disease, stage III (moderate) 08/01/2018    Priority: Medium  . Hyperlipidemia 03/15/2018    Priority: Medium  . Reactive depression 08/14/2015    Priority: Medium  . Essential hypertension 06/22/2006    Priority: Medium  . Urinary incontinence 04/28/2017    Priority: Low  . Dizzy spells 11/10/2014    Priority: Low  . SPINAL STENOSIS, CERVICAL 06/11/2010    Priority: Low  . Arthritis of right knee 06/22/2006    Priority: Low  . Osteopenia 06/22/2006    Priority: Low  . Intention tremor 06/21/2015   Past Surgical History:  Procedure Laterality Date  . CATARACT EXTRACTION      Family History  Problem Relation Age of Onset  . Cancer Daughter        breast    Medications- reviewed and updated Current Outpatient Medications  Medication Sig Dispense Refill  . acetaminophen (TYLENOL) 500 MG tablet Take 1 tablet (500 mg total) by mouth every 6 (six) hours as needed. 30 tablet 0  . amLODipine (NORVASC) 5 MG tablet Take 1 tablet (5 mg total) by mouth daily. 90 tablet 3  . atorvastatin (LIPITOR) 40 MG tablet Take 1 tablet (40 mg total) by mouth at bedtime. 90 tablet 2  . losartan (COZAAR) 100 MG tablet TAKE 1 TABLET BY MOUTH   DAILY (Patient taking differently: Take 100 mg by mouth at bedtime. ) 90 tablet 3  . Multiple Vitamin (MULTIVITAMIN) tablet Take 1 tablet by mouth daily.       No current facility-administered medications for this visit.    Allergies-reviewed and updated Allergies  Allergen Reactions  . Ace Inhibitors     REACTION: angioedema    Social History   Social History Narrative   Married (husband Tom age 46 in [place] practice) 79. 2 Lexa oldest daughter to breast cancer. 4 grandkids.       Husband and patient live with daughter.       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       Hobbies: reading, had been quilting, enjoys cooking/baking.       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    Full Code.          Objective  Objective:  BP 126/78   Pulse 63   Temp 98.4 F (36.9 C) (Temporal)   Ht 5' (1.524 m)   Wt 128 lb 6.4 oz (58.2 kg)   SpO2 95%   BMI 25.08 kg/m  Gen: NAD, resting comfortably HEENT: Mucous membranes are moist. Oropharynx normal Neck: no thyromegaly CV: RRR no murmurs rubs or gallops Lungs: CTAB no crackles, wheeze, rhonchi Abdomen: soft/nontender/nondistended/normal bowel sounds. No rebound or guarding.  Ext: no  edema Skin: warm, dry Neuro: grossly normal, moves all extremities, PERRLA   Assessment and Plan   86 y.o. female presenting for annual physical.  Health Maintenance counseling: 1. Anticipatory guidance: Patient counseled regarding regular dental exam q6 months( she hasn't been recently and declines for now), eye exams- 6 weeks ago,  avoiding smoking and second hand smoke , limiting alcohol to 1 beverage per day.   2. Risk factor reduction:  Advised patient of need for regular exercise and diet rich and fruits and vegetables to reduce risk of heart attack and stroke. Exercise- Patient stated that she works with physical therapy for her wrist- ulnocarpal abutment after fall. Diet-reasonably healthy diet Wt Readings  from Last 3 Encounters:  12/21/19 128 lb 6.4 oz (58.2 kg)  11/23/19 120 lb (54.4 kg)  05/05/19 127 lb 9.6 oz (57.9 kg)  3. Immunizations/screenings/ancillary studies- she will consider shingrix vaccine Immunization History  Administered Date(s) Administered  . Fluad Quad(high Dose 65+) 05/05/2019  . Influenza Split 05/27/2011, 05/18/2012  . Influenza Whole 06/22/2007, 05/25/2008, 06/04/2009, 05/05/2010  . Influenza, High Dose Seasonal PF 05/22/2016, 04/28/2017, 05/02/2018  . Influenza,inj,Quad PF,6+ Mos 05/17/2013, 06/21/2014, 06/06/2015  . PFIZER SARS-COV-2 Vaccination 10/15/2019, 11/08/2019  . Pneumococcal Conjugate-13 07/23/2014  . Pneumococcal Polysaccharide-23 06/22/2007  . Td 11/12/2014  . Zoster 05/25/2008  4. Cervical cancer screening- passed age based screening 5. Breast cancer screening-mammogram years ago- past age based screening 6. Colon cancer screening - passed age based screening 7. Skin cancer screening- no dermatologist. advised regular sunscreen use. Denies worrisome, changing, or new skin lesions.  8. Birth control/STD check- not sexually active 9. Osteoporosis screening at 73- completed on 05-02-2009 -Never smoker  Status of chronic or acute concerns   # Fatigue # left sided headache S:Patient mentioned that she has been having some fatigue. She stated that she has been taking care of her husband who is in a wheelchair which is hard on her.   She reflects back to February hospital visit where she had fall and realizes she hit her head but did not have neuroimaging. She is still having pain in the back of her head where she fell.  A/P:  With fatigue- will update labs as below. With prior fall in February and headaches as well as intracranial blee don asa (now off) - will update head CT. This will also allow [place] to make sure meningioma is stable  #Meningioma S: Discovered at time of CVA July 2019.  We are going to discuss this at July 2020 visit for later-previously  patient/family were open to repeating imaging but strongly against surgery- therefore may not be worth repeating imaging A/P:as above- updated head CT will make sure this is stable.    #Atrial fibrillation/history of intracerebral hemorrhage on aspirin in 2016.  Later with stroke in July 2019-was then stressful.  And blood pressure was higher.  Some concern with atrial fibrillation that that may have been the cause/embolic.  No anticoagulation due to history of intracerebral hemorrhage S: Lingering issues for patient include mild dysarthria.  In regards to A. fib-anticoagulated with no medication due to risks.  Rate controlled without medication. A/P: Paroxysmal atrial fibrillation-not currently in A. fib today.  Family would not want anticoagulation due to history of intracerebral hemorrhage regardless.  Also do not want to do aspirin due to risk of bleeding.  She is rate controlled without medication.  Still has some lingering dysarthria after stroke  #Cerebral amyloid angiopathy-likely increased risk of stroke previously.     #  Hyperlipidemia S: Compliant with atorvastatin 40 mg.  Started after stroke in July 2019 though may have been embolic. Lab Results  Component Value Date   CHOL 139 05/05/2019   HDL 70.40 05/05/2019   LDLCALC 56 05/05/2019   LDLDIRECT 99.0 04/28/2017   TRIG 66.0 05/05/2019   CHOLHDL 2 05/05/2019  A/P: Reasonable control on last check-update direct LDL with labs today   #Hypertension/CKD stage III S: Compliant with losartan 100 mg, amlodipine 5 mg.  History of edema on higher dose amlodipine.  Home blood pressures 140s or below with controlled systolic  BP Readings from Last 3 Encounters:  12/21/19 126/78  11/23/19 (!) 149/117  10/11/19 (!) 153/67  A/P: Stable. Continue current medications.  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 She is feeling much better- hold off on further follow up.  Offered sports medicine referral but she declined  Recommended follow up: 46-month follow-up recommended Future Appointments  Date Time Provider [place]  04/01/2020  2:40 PM Marin Olp, MD [facility]  04/02/2020  1:00 PM Rankin, Clent Demark, MD [facility] None   Lab/Order associations:not fasting   ICD-10-CM   1. Hyperlipidemia, unspecified hyperlipidemia type  E78.5 CBC with Differential/Platelet    Comprehensive metabolic panel    Direct LDL  2. Preventative health care  Z00.00 CBC with Differential/Platelet    Comprehensive metabolic panel    TSH    Direct LDL    Vitamin B12    VITAMIN D 25 Hydroxy (Vit-D Deficiency, Fractures)  3. Fatigue, unspecified type  R53.83 TSH    Vitamin B12    VITAMIN D 25 Hydroxy (Vit-D Deficiency, Fractures)  4. Essential hypertension  I10 CBC with Differential/Platelet    Comprehensive metabolic panel    Direct LDL  5. Cerebral amyloid angiopathy (HCC) Chronic E85.4    I68.0   6. Chronic post-traumatic headache, not intractable  G44.329 CT Head Wo Contrast  7. Meningioma ([place])  D32.9   8. History of fall  Z91.81   9. Atrial fibrillation, unspecified type ([place])  I48.91   10. Postmenopausal  Z78.0 DG Bone Density   Return precautions advised.  Garret Reddish, MD    Medicare AWVS: 08/21/2019

## 2019-12-22 LAB — CBC WITH DIFFERENTIAL/PLATELET
Basophils Absolute: 0.1 10*3/uL (ref 0.0–0.1)
Basophils Relative: 0.8 % (ref 0.0–3.0)
Eosinophils Absolute: 0.1 10*3/uL (ref 0.0–0.7)
Eosinophils Relative: 0.9 % (ref 0.0–5.0)
HCT: 39.5 % (ref 36.0–46.0)
Hemoglobin: 13.4 g/dL (ref 12.0–15.0)
Lymphocytes Relative: 14.2 % (ref 12.0–46.0)
Lymphs Abs: 1.1 10*3/uL (ref 0.7–4.0)
MCHC: 33.9 g/dL (ref 30.0–36.0)
MCV: 90.6 fl (ref 78.0–100.0)
Monocytes Absolute: 1 10*3/uL (ref 0.1–1.0)
Monocytes Relative: 13.2 % — ABNORMAL HIGH (ref 3.0–12.0)
Neutro Abs: 5.6 10*3/uL (ref 1.4–7.7)
Neutrophils Relative %: 70.9 % (ref 43.0–77.0)
Platelets: 162 10*3/uL (ref 150.0–400.0)
RBC: 4.36 Mil/uL (ref 3.87–5.11)
RDW: 14.6 % (ref 11.5–15.5)
WBC: 7.8 10*3/uL (ref 4.0–10.5)

## 2019-12-22 LAB — COMPREHENSIVE METABOLIC PANEL
ALT: 17 U/L (ref 0–35)
AST: 22 U/L (ref 0–37)
Albumin: 4.3 g/dL (ref 3.5–5.2)
Alkaline Phosphatase: 74 U/L (ref 39–117)
BUN: 30 mg/dL — ABNORMAL HIGH (ref 6–23)
CO2: 25 mEq/L (ref 19–32)
Calcium: 9.3 mg/dL (ref 8.4–10.5)
Chloride: 107 mEq/L (ref 96–112)
Creatinine, Ser: 1.11 mg/dL (ref 0.40–1.20)
GFR: 45.18 mL/min — ABNORMAL LOW (ref >60.00)
Glucose, Bld: 92 mg/dL (ref 70–99)
Potassium: 4 mEq/L (ref 3.5–5.1)
Sodium: 143 mEq/L (ref 135–145)
Total Bilirubin: 0.9 mg/dL (ref 0.2–1.2)
Total Protein: 6.8 g/dL (ref 6.0–8.3)

## 2019-12-22 LAB — LDL CHOLESTEROL, DIRECT: Direct LDL: 51 mg/dL

## 2019-12-22 LAB — TSH: TSH: 3.82 u[IU]/mL (ref 0.35–4.50)

## 2019-12-22 LAB — VITAMIN D 25 HYDROXY (VIT D DEFICIENCY, FRACTURES): VITD: 49.4 ng/mL (ref 30.00–100.00)

## 2019-12-22 LAB — VITAMIN B12: Vitamin B-12: 515 pg/mL (ref 211–911)

## 2020-01-04 ENCOUNTER — Other Ambulatory Visit: Payer: Medicare Other

## 2020-01-05 ENCOUNTER — Ambulatory Visit (INDEPENDENT_AMBULATORY_CARE_PROVIDER_SITE_OTHER)
Admission: RE | Admit: 2020-01-05 | Discharge: 2020-01-05 | Disposition: A | Discharge status: Home / Self Care | Payer: Medicare Other | Source: Ambulatory Visit | Attending: Family Medicine | Admitting: Family Medicine

## 2020-01-05 ENCOUNTER — Other Ambulatory Visit: Payer: Self-pay

## 2020-01-05 DIAGNOSIS — R519 Headache, unspecified: Secondary | ICD-10-CM | POA: Diagnosis not present

## 2020-01-05 DIAGNOSIS — G44329 Chronic post-traumatic headache, not intractable: Secondary | ICD-10-CM | POA: Diagnosis not present

## 2020-01-31 ENCOUNTER — Other Ambulatory Visit: Payer: Self-pay

## 2020-01-31 ENCOUNTER — Emergency Department (HOSPITAL_BASED_OUTPATIENT_CLINIC_OR_DEPARTMENT_OTHER): Payer: Medicare Other

## 2020-01-31 ENCOUNTER — Telehealth: Payer: Self-pay | Admitting: Family Medicine

## 2020-01-31 ENCOUNTER — Emergency Department (HOSPITAL_BASED_OUTPATIENT_CLINIC_OR_DEPARTMENT_OTHER)
Admission: EM | Admit: 2020-01-31 | Discharge: 2020-01-31 | Disposition: A | Discharge status: Home / Self Care | Payer: Medicare Other | Attending: Emergency Medicine | Admitting: Emergency Medicine

## 2020-01-31 ENCOUNTER — Encounter (HOSPITAL_BASED_OUTPATIENT_CLINIC_OR_DEPARTMENT_OTHER): Payer: Self-pay

## 2020-01-31 DIAGNOSIS — R531 Weakness: Secondary | ICD-10-CM

## 2020-01-31 DIAGNOSIS — I129 Hypertensive chronic kidney disease with stage 1 through stage 4 chronic kidney disease, or unspecified chronic kidney disease: Secondary | ICD-10-CM | POA: Insufficient documentation

## 2020-01-31 DIAGNOSIS — Z79899 Other long term (current) drug therapy: Secondary | ICD-10-CM | POA: Insufficient documentation

## 2020-01-31 DIAGNOSIS — R4182 Altered mental status, unspecified: Secondary | ICD-10-CM | POA: Insufficient documentation

## 2020-01-31 DIAGNOSIS — Z888 Allergy status to other drugs, medicaments and biological substances status: Secondary | ICD-10-CM | POA: Diagnosis not present

## 2020-01-31 DIAGNOSIS — R5383 Other fatigue: Secondary | ICD-10-CM | POA: Diagnosis present

## 2020-01-31 DIAGNOSIS — R519 Headache, unspecified: Secondary | ICD-10-CM | POA: Diagnosis not present

## 2020-01-31 DIAGNOSIS — N183 Chronic kidney disease, stage 3 unspecified: Secondary | ICD-10-CM | POA: Insufficient documentation

## 2020-01-31 DIAGNOSIS — I10 Essential (primary) hypertension: Secondary | ICD-10-CM | POA: Diagnosis not present

## 2020-01-31 LAB — URINALYSIS, ROUTINE W REFLEX MICROSCOPIC
Bilirubin Urine: NEGATIVE
Glucose, UA: NEGATIVE mg/dL
Hgb urine dipstick: NEGATIVE
Ketones, ur: NEGATIVE mg/dL
Leukocytes,Ua: NEGATIVE
Nitrite: NEGATIVE
Protein, ur: NEGATIVE mg/dL
Specific Gravity, Urine: <1.005 — ABNORMAL LOW (ref 1.005–1.030)
pH: 6.5 (ref 5.0–8.0)

## 2020-01-31 LAB — CBC WITH DIFFERENTIAL/PLATELET
Abs Immature Granulocytes: 0.03 10*3/uL (ref 0.00–0.07)
Basophils Absolute: 0 10*3/uL (ref 0.0–0.1)
Basophils Relative: 1 %
Eosinophils Absolute: 0.1 10*3/uL (ref 0.0–0.5)
Eosinophils Relative: 1 %
HCT: 43.1 % (ref 36.0–46.0)
Hemoglobin: 14.2 g/dL (ref 12.0–15.0)
Immature Granulocytes: 0 %
Lymphocytes Relative: 15 %
Lymphs Abs: 1.2 10*3/uL (ref 0.7–4.0)
MCH: 30.1 pg (ref 26.0–34.0)
MCHC: 32.9 g/dL (ref 30.0–36.0)
MCV: 91.3 fL (ref 80.0–100.0)
Monocytes Absolute: 0.8 10*3/uL (ref 0.1–1.0)
Monocytes Relative: 10 %
Neutro Abs: 6.1 10*3/uL (ref 1.7–7.7)
Neutrophils Relative %: 73 %
Platelets: 152 10*3/uL (ref 150–400)
RBC: 4.72 MIL/uL (ref 3.87–5.11)
RDW: 13.7 % (ref 11.5–15.5)
WBC: 8.3 10*3/uL (ref 4.0–10.5)
nRBC: 0 % (ref 0.0–0.2)

## 2020-01-31 LAB — BASIC METABOLIC PANEL
Anion gap: 8 (ref 5–15)
BUN: 21 mg/dL (ref 8–23)
CO2: 25 mmol/L (ref 22–32)
Calcium: 8.9 mg/dL (ref 8.9–10.3)
Chloride: 106 mmol/L (ref 98–111)
Creatinine, Ser: 0.9 mg/dL (ref 0.44–1.00)
GFR calc Af Amer: >60 mL/min (ref >60)
GFR calc non Af Amer: 52 mL/min — ABNORMAL LOW (ref >60)
Glucose, Bld: 106 mg/dL — ABNORMAL HIGH (ref 70–99)
Potassium: 4.2 mmol/L (ref 3.5–5.1)
Sodium: 139 mmol/L (ref 135–145)

## 2020-01-31 MED ORDER — SODIUM CHLORIDE 0.9 % IV BOLUS
500.0000 mL | Freq: Once | INTRAVENOUS | Status: AC
  Administered 2020-01-31: 500 mL via INTRAVENOUS

## 2020-01-31 NOTE — ED Triage Notes (Addendum)
Per daughter pt with c/o fatigue, "slight confusion", HA-sx started yesterday-daughter concerned pt does not use her [facility] and her apartment was 80+degree with humidity-pt denies pain "states my head is just not there"-denies fever/flu sx-to triage in w/c-NAD

## 2020-01-31 NOTE — Telephone Encounter (Signed)
Nurse Assessment Nurse: Leilani Merl, RN, Heather Date/Time (Eastern Time): 01/31/2020 11:27:49 AM Confirm and document reason for call. If symptomatic, describe symptoms. ---[name] is stating she isn't feeling well, joan, complaining her head isn't clear, complaining of headache 142/72, eye drops for dry eyes isn't helping, tylenol for headache. Has the patient had close contact with a person known or suspected to have the novel coronavirus illness OR traveled / lives in area with major community spread (including international travel) in the last 14 days from the onset of symptoms? * If Asymptomatic, screen for exposure and travel within the last 14 days. ---No Does the patient have any new or worsening symptoms? ---Yes Will a triage be completed? ---Yes Related visit to physician within the last 2 weeks? ---No Does the PT have any chronic conditions? (i.e. diabetes, asthma, this includes High risk factors for pregnancy, etc.) ---Unknown Is this a behavioral health or substance abuse call? ---No Guidelines Guideline Title Affirmed Question Affirmed Notes Nurse Date/Time (Eastern Time) Dizziness - Lightheadedness [1] Drinking very little AND [2] dehydration suspected (e.g., no urine > Standifer, RN, Nira Conn 01/31/2020 11:28:22 AMPLEASE NOTE: All timestamps contained within this report are represented as [place] Standard Time. CONFIDENTIALTY NOTICE: This fax transmission is intended only for the addressee. It contains information that is legally privileged, confidential or otherwise protected from use or disclosure. If you are not the intended recipient, you are strictly prohibited from reviewing, disclosing, copying using or disseminating any of this information or taking any action in reliance on or regarding this information. If you have received this fax in error, please notify us immediately by telephone so that we can arrange for its return to [place]. Phone: [phone],  Toll-Free: [phone], Fax: [phone] Page: 2 of 2 Call Id: [id number] Guidelines Guideline Title Affirmed Question Affirmed Notes Nurse Date/Time Eilene Ghazi Time) 12 hours, very dry mouth, very lightheaded) Disp. Time Eilene Ghazi Time) Disposition Final User 01/31/2020 11:21:11 AM Send to Urgent Queue Ilona Sorrel 01/31/2020 11:34:57 AM Go to ED Now (or PCP triage) Yes Standifer, RN, Soyla Murphy Disagree/Comply Comply Caller Understands Yes PreDisposition Call Doctor Care Advice Given Per Guideline GO TO ED NOW (OR PCP TRIAGE): CARE ADVICE given per Dizziness (Adult) guideline.

## 2020-01-31 NOTE — Telephone Encounter (Signed)
Patent is in ED

## 2020-01-31 NOTE — Discharge Instructions (Addendum)
Continue medications as previously prescribed.  Drink plenty of fluids and get plenty of rest.  Return to the emergency department if you develop chest pain, difficulty breathing, high fever, or other new and concerning symptoms.

## 2020-01-31 NOTE — ED Notes (Signed)
ED Provider at bedside. 

## 2020-01-31 NOTE — ED Provider Notes (Signed)
[facility] EMERGENCY DEPARTMENT Provider Note   CSN: [id number] Arrival date & time: 01/31/20  1220     History Chief Complaint  Patient presents with  . Fatigue    [name] is a 86 y.o. female.  Patient is a 86 year old female with past medical history of hypertension, atrial fibrillation, and prior CVA.  She presents today for evaluation of "not feeling right in the head".  According to her daughter, she seems somewhat confused today and not quite herself.  The patient denies to me she is experiencing any specific complaints such as headache, chest pain, difficulty breathing, or abdominal pain.  She denies fevers or chills.  The patient lives with her husband who is 15 years old in an apartment in the same house, but separate from her daughter who is present at bedside.  According to the daughter, the patient is reluctant to use the air conditioner.  When she checked on her today, the temperature in the apartment was well over 80 degrees and very humid.  Daughter called the primary doctor who recommended she come here to be evaluated for possible dehydration.  The history is provided by the patient.       Past Medical History:  Diagnosis Date  . Hypertension   . Osteopenia   . Stroke [facility])     Patient Active Problem List   Diagnosis Date Noted  . Chronic kidney disease, stage III (moderate) 08/01/2018  . Meningioma ([place]) 03/31/2018  . History of CVA in adulthood 03/31/2018  . Hyperlipidemia 03/15/2018  . Urinary incontinence 04/28/2017  . Reactive depression 08/14/2015  . Intention tremor 06/21/2015  . Cerebral amyloid angiopathy ([place]) 05/29/2015  . Atrial fibrillation ([place]) 03/29/2015  . History of intracerebral hemorrhage    . Dizzy spells 11/10/2014  . SPINAL STENOSIS, CERVICAL 06/11/2010  . Essential hypertension 06/22/2006  . Arthritis of right knee 06/22/2006  . Osteopenia 06/22/2006    Past Surgical History:  Procedure Laterality Date    . CATARACT EXTRACTION       OB History   No obstetric history on file.     Family History  Problem Relation Age of Onset  . Cancer Daughter        breast    Social History   Tobacco Use  . Smoking status: Never Smoker  . Smokeless tobacco: Never Used  Substance Use Topics  . Alcohol use: Yes    Alcohol/week: 1.0 standard drinks    Types: 1 Glasses of wine per week    Comment: 1 glass with dinner  . Drug use: No    Home Medications Prior to Admission medications   Medication Sig Start Date End Date Taking? Authorizing Provider  acetaminophen (TYLENOL) 500 MG tablet Take 1 tablet (500 mg total) by mouth every 6 (six) hours as needed. 11/23/19   Fawze, Mina A, PA-C  amLODipine (NORVASC) 5 MG tablet Take 1 tablet (5 mg total) by mouth daily. 07/28/19   Marin Olp, MD  atorvastatin (LIPITOR) 40 MG tablet Take 1 tablet (40 mg total) by mouth at bedtime. 07/28/19   Marin Olp, MD  losartan (COZAAR) 100 MG tablet TAKE 1 TABLET BY MOUTH  DAILY Patient taking differently: Take 100 mg by mouth at bedtime.  10/02/19   Marin Olp, MD  Multiple Vitamin (MULTIVITAMIN) tablet Take 1 tablet by mouth daily.      [provider]    Allergies    Ace inhibitors  Review of Systems  Review of Systems  All other systems reviewed and are negative.   Physical Exam Updated Vital Signs BP (!) 166/72 (BP Location: Right Arm)   Pulse (!) 59   Temp 98.7 F (37.1 C) (Oral)   Resp 20   SpO2 98%   Physical Exam Vitals and nursing note reviewed.  Constitutional:      General: She is not in acute distress.    Appearance: She is well-developed. She is not diaphoretic.  HENT:     Head: Normocephalic and atraumatic.     Mouth/Throat:     Mouth: Mucous membranes are moist.  Eyes:     Extraocular Movements: Extraocular movements intact.     Pupils: Pupils are equal, round, and reactive to light.  Cardiovascular:     Rate and Rhythm: Normal rate and regular  rhythm.     Heart sounds: No murmur. No friction rub. No gallop.   Pulmonary:     Effort: Pulmonary effort is normal. No respiratory distress.     Breath sounds: Normal breath sounds. No wheezing.  Abdominal:     General: Bowel sounds are normal. There is no distension.     Palpations: Abdomen is soft.     Tenderness: There is no abdominal tenderness.  Musculoskeletal:        General: Normal range of motion.     Cervical back: Normal range of motion and neck supple.  Skin:    General: Skin is warm and dry.  Neurological:     General: No focal deficit present.     Mental Status: She is alert and oriented to person, place, and time. Mental status is at baseline.     Cranial Nerves: No cranial nerve deficit.     Motor: No weakness.     Coordination: Coordination normal.     ED Results / Procedures / Treatments   Labs (all labs ordered are listed, but only abnormal results are displayed) Labs Reviewed  BASIC METABOLIC PANEL  CBC WITH DIFFERENTIAL/PLATELET  URINALYSIS, ROUTINE W REFLEX MICROSCOPIC    EKG None  Radiology No results found.  Procedures Procedures (including critical care time)  Medications Ordered in ED Medications  sodium chloride 0.9 % bolus 500 mL (has no administration in time range)    ED Course  I have reviewed the triage vital signs and the nursing notes.  Pertinent labs & imaging results that were available during my care of the patient were reviewed by me and considered in my medical decision making (see chart for details).    MDM Rules/Calculators/A&P  Patient is a 86 year old female presenting with complaints of confusion and weakness as described in the HPI.  Patient arrives here neurologically intact with stable vital signs.  Patient's work-up reveals no acute abnormality.  Her CBC, basic metabolic panel, and urinalysis are remarkably normal.  Her EKG does show atrial fibrillation, however this is her baseline.  CT scan shows no acute  process with the exception of a meningioma which has been identified in the past and has no complicating features.  I am uncertain as to the etiology of the patient's symptoms, however heat/humidity may be the cause.  The patient's daughter tells me that her and her husband keep the thermostat set high and have not been using the air conditioning.  The temperature in her apartment was well over 80 degrees when the daughter checked on her.  The patient was hydrated with 500 cc of normal saline and appears to be doing well.  At  this point I see no indication for admission and feel as though she is appropriate for discharge.  To return as needed for any problems.  Final Clinical Impression(s) / ED Diagnoses Final diagnoses:  None    Rx / DC Orders ED Discharge Orders    None       Veryl Speak, MD 01/31/20 1437

## 2020-03-03 ENCOUNTER — Other Ambulatory Visit: Payer: Self-pay | Admitting: Family Medicine

## 2020-03-29 NOTE — Progress Notes (Deleted)
Phone [phone] In person visit   Subjective:   [name] is a 86 y.o. year old very pleasant female patient who presents for/with See problem oriented charting No chief complaint on file.   This visit occurred during the SARS-CoV-2 public health emergency.  Safety protocols were in place, including screening questions prior to the visit, additional usage of staff PPE, and extensive cleaning of exam room while observing appropriate contact time as indicated for disinfecting solutions.   Past Medical History-  Patient Active Problem List   Diagnosis Date Noted  . Chronic kidney disease, stage III (moderate) 08/01/2018  . Meningioma ([place]) 03/31/2018  . History of CVA in adulthood 03/31/2018  . Hyperlipidemia 03/15/2018  . Urinary incontinence 04/28/2017  . Reactive depression 08/14/2015  . Intention tremor 06/21/2015  . Cerebral amyloid angiopathy ([place]) 05/29/2015  . Atrial fibrillation ([place]) 03/29/2015  . History of intracerebral hemorrhage    . Dizzy spells 11/10/2014  . SPINAL STENOSIS, CERVICAL 06/11/2010  . Essential hypertension 06/22/2006  . Arthritis of right knee 06/22/2006  . Osteopenia 06/22/2006    Medications- reviewed and updated Current Outpatient Medications  Medication Sig Dispense Refill  . acetaminophen (TYLENOL) 500 MG tablet Take 1 tablet (500 mg total) by mouth every 6 (six) hours as needed. 30 tablet 0  . amLODipine (NORVASC) 5 MG tablet Take 1 tablet (5 mg total) by mouth daily. 90 tablet 3  . atorvastatin (LIPITOR) 40 MG tablet TAKE 1 TABLET BY MOUTH AT  BEDTIME 90 tablet 3  . losartan (COZAAR) 100 MG tablet TAKE 1 TABLET BY MOUTH  DAILY (Patient taking differently: Take 100 mg by mouth at bedtime. ) 90 tablet 3  . Multiple Vitamin (MULTIVITAMIN) tablet Take 1 tablet by mouth daily.       No current facility-administered medications for this visit.     Objective:  There were no vitals taken for this visit. Gen: NAD, resting  comfortably CV: RRR no murmurs rubs or gallops Lungs: CTAB no crackles, wheeze, rhonchi Abdomen: soft/nontender/nondistended/normal bowel sounds. No rebound or guarding.  Ext: no edema Skin: warm, dry Neuro: grossly normal, moves all extremities  ***    Assessment and Plan  *** ***08/21/2019 awv  #Meningioma S: Discovered at time of CVA July 2019.  Previously patient/family were open to repeating imaging but strongly against surgery- therefore may not be worth repeating imaging and intially patient was asymptomatic A/P:***   #Atrial fibrillation/history of intracerebral hemorrhage on aspirin in 2016.  Later with stroke in July 2019 with history of cerebral amyloid angiopathy which increases risk of stroke-was then stressful.  And blood pressure was higher.  Some concern with atrial fibrillation that that may have been the cause/embolic.  No anticoagulation due to history of intracerebral hemorrhage S: Lingering issues for patient include mild dysarthria.***  In regards to A. fib-anticoagulated with no medication due to risks.  Rate controlled without medication. A/P: ***    #Hyperlipidemia S: Compliant with atorvastatin 40 mg.  Started after stroke in July 2019 though may have been embolic. A/P: ***    #Hypertension/CKD stage III S: Compliant with losartan 100 mg, amlodipine 5 mg.  History of edema on higher dose amlodipine. ***   GFR has been stable in the 30-60 range A/P: ***   No problem-specific Assessment & Plan notes found for this encounter.   Recommended follow up: ***No follow-ups on file. Future Appointments  Date Time Provider [place]  04/01/2020  2:40 PM Marin Olp, MD [facility]  04/02/2020  1:00 PM Rankin, Clent Demark, MD [facility] None  04/08/2020  1:45 PM Jacqualyn Posey Bonna Gains, DPM [facility]    Lab/Order associations: No diagnosis found.  No orders of the defined types were placed in this encounter.   Time Spent: *** minutes of total  time (4:42 PM***- 4:42 PM***) was spent on the date of the encounter performing the following actions: chart review prior to seeing the patient, obtaining history, performing a medically necessary exam, counseling on the treatment plan, placing orders, and documenting in our EHR.   Return precautions advised.  Clyde Lundborg, CMA

## 2020-04-01 ENCOUNTER — Ambulatory Visit: Payer: Medicare Other | Admitting: Family Medicine

## 2020-04-02 ENCOUNTER — Other Ambulatory Visit: Payer: Self-pay

## 2020-04-02 ENCOUNTER — Encounter (INDEPENDENT_AMBULATORY_CARE_PROVIDER_SITE_OTHER): Payer: Self-pay | Admitting: Ophthalmology

## 2020-04-02 ENCOUNTER — Ambulatory Visit (INDEPENDENT_AMBULATORY_CARE_PROVIDER_SITE_OTHER): Payer: Medicare Other | Admitting: Ophthalmology

## 2020-04-02 DIAGNOSIS — H353132 Nonexudative age-related macular degeneration, bilateral, intermediate dry stage: Secondary | ICD-10-CM | POA: Diagnosis not present

## 2020-04-02 DIAGNOSIS — H353221 Exudative age-related macular degeneration, left eye, with active choroidal neovascularization: Secondary | ICD-10-CM

## 2020-04-02 MED ORDER — BEVACIZUMAB CHEMO INJECTION 1.25MG/0.05ML SYRINGE FOR KALEIDOSCOPE
1.2500 mg | INTRAVITREAL | Status: AC | PRN
  Administered 2020-04-02: 1.25 mg via INTRAVITREAL

## 2020-04-02 NOTE — Assessment & Plan Note (Signed)
The nature of age--related macular degeneration was discussed with the patient as well as the distinction between dry and wet types. Checking an Amsler Grid daily with advice to return immediately should a distortion develop, was given to the patient. The patient 's smoking status now and in the past was determined and advice based on the AREDS study was provided regarding the consumption of antioxidant supplements. AREDS 2 vitamin formulation was recommended. Consumption of dark leafy vegetables and fresh fruits of various colors was recommended. Treatment modalities for wet macular degeneration particularly the use of intravitreal injections of anti-blood vessel growth factors was discussed with the patient. Avastin, Lucentis, and Eylea are the available options. On occasion, therapy includes the use of photodynamic therapy and thermal laser. Stressed to the patient do not rub eyes.  Patient was advised to check Amsler Grid daily and return immediately if changes are noted. Instructions on using the grid were given to the patient. All patient questions were answered.  OD stable

## 2020-04-02 NOTE — Progress Notes (Signed)
04/02/2020     CHIEF COMPLAINT Patient presents for Retina Follow Up   HISTORY OF PRESENT ILLNESS: [name] is a 86 y.o. female who presents to the clinic today for:   HPI    Retina Follow Up    Patient presents with  Dry AMD.  In both eyes.  Duration of 6 months.  Since onset it is stable.          Comments    6 month follow up - OCT OU Patient denies change in vision and overall has no complaints.        Last edited by Gerda Diss on 04/02/2020  1:20 PM. (History)      Referring physician: Marin Olp, [address],  [place] [address]  HISTORICAL INFORMATION:   Selected notes from the MEDICAL RECORD NUMBER    Lab Results  Component Value Date   HGBA1C 5.8 (H) 03/25/2015     CURRENT MEDICATIONS: No current outpatient medications on file. (Ophthalmic Drugs)   No current facility-administered medications for this visit. (Ophthalmic Drugs)   Current Outpatient Medications (Other)  Medication Sig  . acetaminophen (TYLENOL) 500 MG tablet Take 1 tablet (500 mg total) by mouth every 6 (six) hours as needed.  Marland Kitchen amLODipine (NORVASC) 5 MG tablet Take 1 tablet (5 mg total) by mouth daily.  Marland Kitchen atorvastatin (LIPITOR) 40 MG tablet TAKE 1 TABLET BY MOUTH AT  BEDTIME  . losartan (COZAAR) 100 MG tablet TAKE 1 TABLET BY MOUTH  DAILY (Patient taking differently: Take 100 mg by mouth at bedtime. )  . Multiple Vitamin (MULTIVITAMIN) tablet Take 1 tablet by mouth daily.     No current facility-administered medications for this visit. (Other)      REVIEW OF SYSTEMS:    ALLERGIES Allergies  Allergen Reactions  . Ace Inhibitors     REACTION: angioedema    PAST MEDICAL HISTORY Past Medical History:  Diagnosis Date  . Hypertension   . Osteopenia   . Stroke [facility])    Past Surgical History:  Procedure Laterality Date  . CATARACT EXTRACTION      FAMILY HISTORY Family History  Problem Relation Age of Onset  . Cancer Daughter         breast    SOCIAL HISTORY Social History   Tobacco Use  . Smoking status: Never Smoker  . Smokeless tobacco: Never Used  Vaping Use  . Vaping Use: Never used  Substance Use Topics  . Alcohol use: Yes    Alcohol/week: 1.0 standard drink    Types: 1 Glasses of wine per week    Comment: 1 glass with dinner  . Drug use: No         OPHTHALMIC EXAM:  Base Eye Exam    Visual Acuity (Snellen - Linear)      Right Left   Dist cc 20/30+2 20/200   Correction: Glasses       Tonometry (Tonopen, 1:27 PM)      Right Left   Pressure 16 16       Pupils      Pupils Dark Light Shape React APD   Right PERRL 7 7 Round Minimal None   Left PERRL 6 6 Round Minimal None       Visual Fields (Counting fingers)      Left Right    Full Full       Extraocular Movement      Right Left    Full Full  Neuro/Psych    Oriented x3: Yes   Mood/Affect: Normal       Dilation    Both eyes: 1.0% Mydriacyl, 2.5% Phenylephrine @ 1:27 PM        Slit Lamp and Fundus Exam    External Exam      Right Left   External Normal Normal       Slit Lamp Exam      Right Left   Lids/Lashes Normal Normal   Conjunctiva/Sclera White and quiet White and quiet   Cornea Clear Clear   Anterior Chamber Deep and quiet Deep and quiet   Iris Round and reactive Round and reactive   Lens Posterior chamber intraocular lens Posterior chamber intraocular lens   Anterior Vitreous Normal Normal       Fundus Exam      Right Left   Posterior Vitreous Posterior vitreous detachment Posterior vitreous detachment   Disc Normal Normal   C/D Ratio 0.35 0.35   Macula Intermediate age related macular degeneration, Hard drusen, Mottling, Retinal pigment epithelial mottling Macular thickening, Hard drusen, Pigmented atrophy, Advanced age related macular degeneration, Subretinal hemorrhage   Vessels Normal Normal   Periphery Normal Normal          IMAGING AND PROCEDURES  Imaging and Procedures for  04/02/20  OCT, Retina - OU - Both Eyes       Right Eye Quality was borderline. Scan locations included subfoveal. Progression has been stable. Findings include no SRF, retinal drusen .   Left Eye Quality was good. Scan locations included subfoveal. Findings include pigment epithelial detachment, cystoid macular edema, abnormal foveal contour, intraretinal fluid, subretinal fluid.        Intravitreal Injection, Pharmacologic Agent - OS - Left Eye       Time Out 04/02/2020. 1:47 PM. Confirmed correct patient, procedure, site, and patient consented.   Anesthesia Topical anesthesia was used. Anesthetic medications included Akten 3.5%.   Procedure Preparation included Tobramycin 0.3%, 10% betadine to eyelids, 5% betadine to ocular surface. A 30 gauge needle was used.   Injection:  1.25 mg Bevacizumab (AVASTIN) SOLN   NDC: [address]-9, Lot: [id number]   Route: Intravitreal, Site: Left Eye, Waste: 0 mg  Post-op Post injection exam found visual acuity of at least counting fingers. The patient tolerated the procedure well. There were no complications. Post injection medications were not given.                 ASSESSMENT/PLAN:  Intermediate stage nonexudative age-related macular degeneration of both eyes The nature of age--related macular degeneration was discussed with the patient as well as the distinction between dry and wet types. Checking an Amsler Grid daily with advice to return immediately should a distortion develop, was given to the patient. The patient 's smoking status now and in the past was determined and advice based on the AREDS study was provided regarding the consumption of antioxidant supplements. AREDS 2 vitamin formulation was recommended. Consumption of dark leafy vegetables and fresh fruits of various colors was recommended. Treatment modalities for wet macular degeneration particularly the use of intravitreal injections of anti-blood vessel growth factors was  discussed with the patient. Avastin, Lucentis, and Eylea are the available options. On occasion, therapy includes the use of photodynamic therapy and thermal laser. Stressed to the patient do not rub eyes.  Patient was advised to check Amsler Grid daily and return immediately if changes are noted. Instructions on using the grid were given to the patient. All patient questions were  answered.  OD stable      ICD-10-CM   1. Exudative age-related macular degeneration of left eye with active choroidal neovascularization (HCC)  H35.3221 OCT, Retina - OU - Both Eyes    Intravitreal Injection, Pharmacologic Agent - OS - Left Eye    Bevacizumab (AVASTIN) SOLN 1.25 mg  2. Intermediate stage nonexudative age-related macular degeneration of both eyes  H35.3132     1.  New finding today left eye is wet ARMD, active CNVM on OCT.  Commence with therapy today intravitreal Avastin  2.  Follow-up examination left eye in 5 weeks  3.  Ophthalmic Meds Ordered this visit:  Meds ordered this encounter  Medications  . Bevacizumab (AVASTIN) SOLN 1.25 mg       Return in about 5 weeks (around 05/07/2020) for dilate, OS, AVASTIN OCT.  There are no Patient Instructions on file for this visit.   Explained the diagnoses, plan, and follow up with the patient and they expressed understanding.  Patient expressed understanding of the importance of proper follow up care.   Clent Demark Rashed Edler M.D. Diseases & Surgery of the Retina and Vitreous [facility] [place] 04/02/20     Abbreviations: M myopia (nearsighted); A astigmatism; H hyperopia (farsighted); P presbyopia; Mrx spectacle prescription;  CTL contact lenses; OD right eye; OS left eye; OU both eyes  XT exotropia; ET esotropia; PEK punctate epithelial keratitis; PEE punctate epithelial erosions; DES dry eye syndrome; MGD meibomian gland dysfunction; ATs artificial tears; PFAT's preservative free artificial tears; [place] nuclear sclerotic cataract; PSC  posterior subcapsular cataract; ERM epi-retinal membrane; PVD posterior vitreous detachment; RD retinal detachment; DM diabetes mellitus; DR diabetic retinopathy; NPDR non-proliferative diabetic retinopathy; PDR proliferative diabetic retinopathy; CSME clinically significant macular edema; DME diabetic macular edema; dbh dot blot hemorrhages; CWS cotton wool spot; POAG primary open angle glaucoma; C/D cup-to-disc ratio; HVF humphrey visual field; GVF goldmann visual field; OCT optical coherence tomography; IOP intraocular pressure; BRVO Branch retinal vein occlusion; CRVO central retinal vein occlusion; CRAO central retinal artery occlusion; BRAO branch retinal artery occlusion; RT retinal tear; SB scleral buckle; PPV pars plana vitrectomy; VH Vitreous hemorrhage; PRP panretinal laser photocoagulation; IVK intravitreal kenalog; VMT vitreomacular traction; MH Macular hole;  NVD neovascularization of the disc; NVE neovascularization elsewhere; AREDS age related eye disease study; ARMD age related macular degeneration; POAG primary open angle glaucoma; EBMD epithelial/anterior basement membrane dystrophy; ACIOL anterior chamber intraocular lens; IOL intraocular lens; PCIOL posterior chamber intraocular lens; Phaco/IOL phacoemulsification with intraocular lens placement; [place] photorefractive keratectomy; LASIK laser assisted in situ keratomileusis; HTN hypertension; DM diabetes mellitus; COPD chronic obstructive pulmonary disease

## 2020-04-08 ENCOUNTER — Ambulatory Visit: Payer: Medicare Other | Admitting: Podiatry

## 2020-04-08 ENCOUNTER — Ambulatory Visit (INDEPENDENT_AMBULATORY_CARE_PROVIDER_SITE_OTHER): Payer: Medicare Other | Admitting: Podiatry

## 2020-04-08 ENCOUNTER — Other Ambulatory Visit: Payer: Self-pay

## 2020-04-08 DIAGNOSIS — M79675 Pain in left toe(s): Secondary | ICD-10-CM | POA: Diagnosis not present

## 2020-04-08 DIAGNOSIS — B351 Tinea unguium: Secondary | ICD-10-CM | POA: Diagnosis not present

## 2020-04-08 DIAGNOSIS — M79674 Pain in right toe(s): Secondary | ICD-10-CM | POA: Diagnosis not present

## 2020-04-09 ENCOUNTER — Ambulatory Visit: Payer: Medicare Other | Admitting: Family Medicine

## 2020-04-10 ENCOUNTER — Encounter: Payer: Self-pay | Admitting: Family Medicine

## 2020-04-10 ENCOUNTER — Telehealth: Payer: Self-pay

## 2020-04-10 NOTE — Progress Notes (Signed)
Subjective: 86 y.o. returns the office today for painful, elongated, thickened toenails which she cannot trim herself. Denies any redness or drainage around the nails. Denies any acute changes since last appointment and no new complaints today. Denies any systemic complaints such as fevers, chills, nausea, vomiting.   PCP: Marin Olp, MD   Objective: AAO 3, NAD DP/PT pulses palpable, CRT less than 3 seconds Nails hypertrophic, dystrophic, elongated, brittle, discolored 10. There is tenderness overlying the nails 1-5 bilaterally. There is no surrounding erythema or drainage along the nail sites. No open lesions or pre-ulcerative lesions are identified. No other areas of tenderness bilateral lower extremities. No overlying edema, erythema, increased warmth. No pain with calf compression, swelling, warmth, erythema.  Assessment: Patient presents with symptomatic onychomycosis  Plan: -Treatment options including alternatives, risks, complications were discussed -Nails sharply debrided 10 without complication/bleeding. -Discussed daily foot inspection. If there are any changes, to call the office immediately.  -Follow-up in 3 months or sooner if any problems are to arise. In the meantime, encouraged to call the office with any questions, concerns, changes symptoms.  Celesta Gentile, DPM

## 2020-04-10 NOTE — Telephone Encounter (Signed)
error 

## 2020-04-11 ENCOUNTER — Encounter: Payer: Self-pay | Admitting: Family Medicine

## 2020-04-16 ENCOUNTER — Encounter: Payer: Self-pay | Admitting: Family Medicine

## 2020-04-16 ENCOUNTER — Ambulatory Visit (INDEPENDENT_AMBULATORY_CARE_PROVIDER_SITE_OTHER): Payer: Medicare Other | Admitting: Family Medicine

## 2020-04-16 ENCOUNTER — Other Ambulatory Visit: Payer: Self-pay

## 2020-04-16 VITALS — BP 136/72 | HR 69 | Temp 98.2°F | Ht 60.0 in | Wt 128.0 lb

## 2020-04-16 DIAGNOSIS — E785 Hyperlipidemia, unspecified: Secondary | ICD-10-CM

## 2020-04-16 DIAGNOSIS — D329 Benign neoplasm of meninges, unspecified: Secondary | ICD-10-CM | POA: Diagnosis not present

## 2020-04-16 DIAGNOSIS — I4891 Unspecified atrial fibrillation: Secondary | ICD-10-CM | POA: Diagnosis not present

## 2020-04-16 DIAGNOSIS — H3530 Unspecified macular degeneration: Secondary | ICD-10-CM | POA: Insufficient documentation

## 2020-04-16 DIAGNOSIS — N183 Chronic kidney disease, stage 3 unspecified: Secondary | ICD-10-CM

## 2020-04-16 DIAGNOSIS — I10 Essential (primary) hypertension: Secondary | ICD-10-CM | POA: Diagnosis not present

## 2020-04-16 NOTE — Patient Instructions (Addendum)
Health Maintenance Due  Topic Date Due  .      Marland Kitchen INFLUENZA VACCINE  - will complete later in flu season (please let us know if you get this at another location so we can update your chart) . We should have vaccination here in 1-2 months - can call back for an appointment.   Also covid 19 booster November 17th  03/24/2020   Could ask [employer] if they would send out social worker for [place] (I will sign for this if they send out) to look at potential living situations.   If stress at home does not improve/you do not find a more favorable situation for the both of you- can consider medicine to help with stress such as low dose zoloft  No changes today. We discussed meningioma (growth in brain) but once again wanted to hold off on further imaging as would not want surgery even if this has changed recently.   Recommended follow up: Return in about 3 months (around 07/17/2020) for follow up- or sooner if needed.

## 2020-04-16 NOTE — Progress Notes (Signed)
Phone [phone] In person visit   Subjective:   [name] is a 86 y.o. year old very pleasant female patient who presents for/with See problem oriented charting Chief Complaint  Patient presents with  . Follow-up   This visit occurred during the SARS-CoV-2 public health emergency.  Safety protocols were in place, including screening questions prior to the visit, additional usage of staff PPE, and extensive cleaning of exam room while observing appropriate contact time as indicated for disinfecting solutions.   Past Medical History-  Patient Active Problem List   Diagnosis Date Noted  . Meningioma ([place]) 03/31/2018    Priority: High  . History of CVA in adulthood 03/31/2018    Priority: High  . Cerebral amyloid angiopathy ([place]) 05/29/2015    Priority: High  . Atrial fibrillation ([place]) 03/29/2015    Priority: High  . History of intracerebral hemorrhage      Priority: High  . Macular degeneration 04/16/2020    Priority: Medium  . Chronic kidney disease, stage III (moderate) 08/01/2018    Priority: Medium  . Hyperlipidemia 03/15/2018    Priority: Medium  . Reactive depression 08/14/2015    Priority: Medium  . Essential hypertension 06/22/2006    Priority: Medium  . Urinary incontinence 04/28/2017    Priority: Low  . Intention tremor 06/21/2015    Priority: Low  . Dizzy spells 11/10/2014    Priority: Low  . SPINAL STENOSIS, CERVICAL 06/11/2010    Priority: Low  . Arthritis of right knee 06/22/2006    Priority: Low  . Osteopenia 06/22/2006    Priority: Low  . Exudative age-related macular degeneration of left eye with active choroidal neovascularization ([place]) 04/02/2020  . Intermediate stage nonexudative age-related macular degeneration of both eyes 04/02/2020    Medications- reviewed and updated Current Outpatient Medications  Medication Sig Dispense Refill  . acetaminophen (TYLENOL) 500 MG tablet Take 1 tablet (500 mg total) by mouth every 6 (six) hours as  needed. 30 tablet 0  . amLODipine (NORVASC) 5 MG tablet Take 1 tablet (5 mg total) by mouth daily. 90 tablet 3  . atorvastatin (LIPITOR) 40 MG tablet TAKE 1 TABLET BY MOUTH AT  BEDTIME 90 tablet 3  . losartan (COZAAR) 100 MG tablet TAKE 1 TABLET BY MOUTH  DAILY (Patient taking differently: Take 100 mg by mouth at bedtime. ) 90 tablet 3  . Multiple Vitamin (MULTIVITAMIN) tablet Take 1 tablet by mouth daily.       No current facility-administered medications for this visit.     Objective:  BP 136/72   Pulse 69   Temp 98.2 F (36.8 C) (Temporal)   Ht 5' (1.524 m)   Wt 128 lb (58.1 kg)   SpO2 97%   BMI 25.00 kg/m  Gen: NAD, resting comfortably CV: RRR no murmurs rubs or gallops Lungs: CTAB no crackles, wheeze, rhonchi Abdomen: soft/nontender/nondistended/normal bowel sounds. .  Ext: trace to 1+ edema Skin: warm, dry    Assessment and Plan   # Anxiety  S: has been having increased anxiety. She has admitted that it has been increased.   Husband Gershon Mussel wants to go into nursing home but shes not quite ready. He may need higher lvel of care- working with PT/OT right now A/P:  Anxiety/situational stress. Main issue is where living situation will be long term. Discussed seeing if husbands home health group could send social worker to look at options. May need to consider medication or counseling but she wants to hold off for now.   #  Meningioma S: Discovered at time of CVA July 2019.  Previously patient/family were open to repeating imaging but strongly against surgery- therefore may not be worth repeating imaging and intially patient was asymptomatic. Mild headaches frontal at times now.  A/P: discussed today and she would not want surgery. She lays down with headaches and they improve- that's a reasonable plan for now   #Atrial fibrillation/history of intracerebral hemorrhage on aspirin in 2016.  Later with stroke in July 2019 with history of cerebral amyloid angiopathy which increases risk  of stroke-was then stressful.  And blood pressure was higher.  Some concern with atrial fibrillation that that may have been the cause/embolic.  No anticoagulation due to history of intracerebral hemorrhage S: Lingering issues for patient include mild dysarthria. Appears stable.   Rate controlled without meds. No anticoagulation.  A/P:  A fib not anticoagulated due to fall risk (even higher now with macular degeneration). Rate controlled with out medication.     #Hyperlipidemia S: Compliant with atorvastatin 40 mg.  Started after stroke in July 2019 though may have been embolic. Lab Results  Component Value Date   CHOL 139 05/05/2019   HDL 70.40 05/05/2019   LDLCALC 56 05/05/2019   LDLDIRECT 51.0 12/21/2019   TRIG 66.0 05/05/2019   CHOLHDL 2 05/05/2019  A/P: good control last check- will check lipid panel next visit    #Hypertension/CKD stage III S: Compliant with losartan 100 mg, amlodipine 5 mg.  History of edema on higher dose amlodipine.    GFR has been stable in the 50 range primarily BP Readings from Last 3 Encounters:  04/16/20 136/72  01/31/20 (!) 182/80  12/21/19 126/78  A/P: stable on  Bmp in June for CKD. BP well controlled  # recommended continuing to use walker on home due to fall risk  Recommended follow up: Return in about 3 months (around 07/17/2020) for follow up- or sooner if needed. Future Appointments  Date Time Provider [place]  05/09/2020  1:30 PM Rankin, Clent Demark, MD [facility] None  07/09/2020  1:45 PM Jacqualyn Posey Bonna Gains, DPM [facility]    Lab/Order associations:   ICD-10-CM   1. Hyperlipidemia, unspecified hyperlipidemia type  E78.5   2. Essential hypertension  I10   3. Macular degeneration of both eyes, unspecified type  H35.30   4. Meningioma ([place])  D32.9   5. Atrial fibrillation, unspecified type ([place])  I48.91   6. Stage 3 chronic kidney disease, unspecified whether stage 3a or 3b CKD  N18.30      Return precautions advised.   Garret Reddish, MD

## 2020-05-09 ENCOUNTER — Encounter (INDEPENDENT_AMBULATORY_CARE_PROVIDER_SITE_OTHER): Payer: Medicare Other | Admitting: Ophthalmology

## 2020-05-13 ENCOUNTER — Encounter (INDEPENDENT_AMBULATORY_CARE_PROVIDER_SITE_OTHER): Payer: Medicare Other | Admitting: Ophthalmology

## 2020-05-14 ENCOUNTER — Other Ambulatory Visit: Payer: Self-pay

## 2020-05-14 ENCOUNTER — Encounter (INDEPENDENT_AMBULATORY_CARE_PROVIDER_SITE_OTHER): Payer: Self-pay | Admitting: Ophthalmology

## 2020-05-14 ENCOUNTER — Ambulatory Visit (INDEPENDENT_AMBULATORY_CARE_PROVIDER_SITE_OTHER): Payer: Medicare Other | Admitting: Ophthalmology

## 2020-05-14 DIAGNOSIS — H353221 Exudative age-related macular degeneration, left eye, with active choroidal neovascularization: Secondary | ICD-10-CM

## 2020-05-14 MED ORDER — BEVACIZUMAB CHEMO INJECTION 1.25MG/0.05ML SYRINGE FOR KALEIDOSCOPE
1.2500 mg | INTRAVITREAL | Status: AC | PRN
  Administered 2020-05-14: 1.25 mg via INTRAVITREAL

## 2020-05-14 NOTE — Assessment & Plan Note (Signed)
OS, anatomy vastly improved status post intravitreal Avastin No. 1, repeat injection today and examination in 5 to 6 weeks

## 2020-05-14 NOTE — Patient Instructions (Signed)
Instructed to contact the office promptly for new onset visual acuity decline or distortions 

## 2020-05-14 NOTE — Progress Notes (Signed)
05/14/2020     CHIEF COMPLAINT Patient presents for Retina Follow Up   HISTORY OF PRESENT ILLNESS: [name] is a 86 y.o. female who presents to the clinic today for:   HPI    Retina Follow Up    Patient presents with  Wet AMD.  In left eye.  Severity is moderate.  Duration of 6 weeks.  Since onset it is stable.  I, the attending physician,  performed the HPI with the patient and updated documentation appropriately.          Comments    6 Week Wet AMD f\u OS. Possible Avastin OS. Oct  Pt states vision has decreased. Pt c/o frequent headaches.       Last edited by Tilda Franco on 05/14/2020  2:58 PM. (History)      Referring physician: Marin Olp, [address] [place],  [place] [address]  HISTORICAL INFORMATION:   Selected notes from the MEDICAL RECORD NUMBER    Lab Results  Component Value Date   HGBA1C 5.8 (H) 03/25/2015     CURRENT MEDICATIONS: No current outpatient medications on file. (Ophthalmic Drugs)   No current facility-administered medications for this visit. (Ophthalmic Drugs)   Current Outpatient Medications (Other)  Medication Sig  . acetaminophen (TYLENOL) 500 MG tablet Take 1 tablet (500 mg total) by mouth every 6 (six) hours as needed.  Marland Kitchen amLODipine (NORVASC) 5 MG tablet Take 1 tablet (5 mg total) by mouth daily.  Marland Kitchen atorvastatin (LIPITOR) 40 MG tablet TAKE 1 TABLET BY MOUTH AT  BEDTIME  . losartan (COZAAR) 100 MG tablet TAKE 1 TABLET BY MOUTH  DAILY (Patient taking differently: Take 100 mg by mouth at bedtime. )  . Multiple Vitamin (MULTIVITAMIN) tablet Take 1 tablet by mouth daily.     No current facility-administered medications for this visit. (Other)      REVIEW OF SYSTEMS:    ALLERGIES Allergies  Allergen Reactions  . Ace Inhibitors     REACTION: angioedema    PAST MEDICAL HISTORY Past Medical History:  Diagnosis Date  . Hypertension   . Osteopenia   . Stroke [facility])    Past Surgical History:    Procedure Laterality Date  . CATARACT EXTRACTION      FAMILY HISTORY Family History  Problem Relation Age of Onset  . Cancer Daughter        breast    SOCIAL HISTORY Social History   Tobacco Use  . Smoking status: Never Smoker  . Smokeless tobacco: Never Used  Vaping Use  . Vaping Use: Never used  Substance Use Topics  . Alcohol use: Yes    Alcohol/week: 1.0 standard drink    Types: 1 Glasses of wine per week    Comment: 1 glass with dinner  . Drug use: No         OPHTHALMIC EXAM: Base Eye Exam    Visual Acuity (Snellen - Linear)      Right Left   Dist cc 20/40 -2 20/200   Dist ph cc NI NI   Correction: Glasses       Tonometry (Tonopen, 3:03 PM)      Right Left   Pressure 12 12       Pupils      Pupils Dark Light Shape React APD   Right PERRL 7 7 Round Minimal None   Left PERRL 6 6 Round Minimal None       Neuro/Psych    Oriented  x3: Yes   Mood/Affect: Normal       Dilation    Left eye: 1.0% Mydriacyl, 2.5% Phenylephrine @ 3:03 PM        Slit Lamp and Fundus Exam    External Exam      Right Left   External Normal Normal       Slit Lamp Exam      Right Left   Lids/Lashes Normal Normal   Conjunctiva/Sclera White and quiet White and quiet   Cornea Clear Clear   Anterior Chamber Deep and quiet Deep and quiet   Iris Round and reactive Round and reactive   Lens Posterior chamber intraocular lens Posterior chamber intraocular lens   Anterior Vitreous Normal Normal       Fundus Exam      Right Left   Posterior Vitreous  Posterior vitreous detachment   Disc  Normal   C/D Ratio  0.35   Macula   Hard drusen, Pigmented atrophy, Advanced age related macular degeneration,  NO Subretinal hemorrhage   Vessels  Normal   Periphery  Normal          IMAGING AND PROCEDURES  Imaging and Procedures for 05/14/20  OCT, Retina - OU - Both Eyes       Right Eye Quality was good. Scan locations included subfoveal. Central Foveal Thickness: 244.  Progression has been stable. Findings include no SRF, no IRF, retinal drusen .   Left Eye Central Foveal Thickness: 249. Progression has improved. Findings include no SRF, pigment epithelial detachment.   Notes OS status post intravitreal Avastin No. 1, vastly improved anatomy and clinical findings.  Repeat injection intravitreal Avastin OS today       Intravitreal Injection, Pharmacologic Agent - OS - Left Eye       Time Out 05/14/2020. 3:45 PM. Confirmed correct patient, procedure, site, and patient consented.   Anesthesia Topical anesthesia was used. Anesthetic medications included [place] 3.5%.   Procedure Preparation included Tobramycin 0.3%, 10% betadine to eyelids, 5% betadine to ocular surface. A supplied needle was used.   Injection:  1.25 mg Bevacizumab (AVASTIN) SOLN   NDC: [address]-2, Lot: [id number]   Route: Intravitreal, Site: Left Eye, Waste: 0 mg  Post-op Post injection exam found visual acuity of at least counting fingers. The patient tolerated the procedure well. There were no complications. Post injection medications were not given.                 ASSESSMENT/PLAN:  Exudative age-related macular degeneration of left eye with active choroidal neovascularization (HCC) OS, anatomy vastly improved status post intravitreal Avastin No. 1, repeat injection today and examination in 5 to 6 weeks      ICD-10-CM   1. Exudative age-related macular degeneration of left eye with active choroidal neovascularization (HCC)  H35.3221 OCT, Retina - OU - Both Eyes    Intravitreal Injection, Pharmacologic Agent - OS - Left Eye    Bevacizumab (AVASTIN) SOLN 1.25 mg    1.  2.  3.  Ophthalmic Meds Ordered this visit:  Meds ordered this encounter  Medications  . Bevacizumab (AVASTIN) SOLN 1.25 mg       Return in about 6 weeks (around 06/25/2020) for dilate, OS, AVASTIN OCT.  There are no Patient Instructions on file for this visit.   Explained the diagnoses,  plan, and follow up with the patient and they expressed understanding.  Patient expressed understanding of the importance of proper follow up care.   Clent Demark Elexa Kivi M.D.  Diseases & Surgery of the Retina and Vitreous Retina [facility] Diabetic [place] 05/14/20     Abbreviations: M myopia (nearsighted); A astigmatism; H hyperopia (farsighted); P presbyopia; Mrx spectacle prescription;  CTL contact lenses; OD right eye; OS left eye; OU both eyes  XT exotropia; ET esotropia; PEK punctate epithelial keratitis; PEE punctate epithelial erosions; DES dry eye syndrome; MGD meibomian gland dysfunction; ATs artificial tears; PFAT's preservative free artificial tears; [place] nuclear sclerotic cataract; PSC posterior subcapsular cataract; ERM epi-retinal membrane; PVD posterior vitreous detachment; RD retinal detachment; DM diabetes mellitus; DR diabetic retinopathy; NPDR non-proliferative diabetic retinopathy; PDR proliferative diabetic retinopathy; CSME clinically significant macular edema; DME diabetic macular edema; dbh dot blot hemorrhages; CWS cotton wool spot; POAG primary open angle glaucoma; C/D cup-to-disc ratio; HVF humphrey visual field; GVF goldmann visual field; OCT optical coherence tomography; IOP intraocular pressure; BRVO Branch retinal vein occlusion; CRVO central retinal vein occlusion; CRAO central retinal artery occlusion; BRAO branch retinal artery occlusion; RT retinal tear; SB scleral buckle; PPV pars plana vitrectomy; VH Vitreous hemorrhage; PRP panretinal laser photocoagulation; IVK intravitreal kenalog; VMT vitreomacular traction; MH Macular hole;  NVD neovascularization of the disc; NVE neovascularization elsewhere; AREDS age related eye disease study; ARMD age related macular degeneration; POAG primary open angle glaucoma; EBMD epithelial/anterior basement membrane dystrophy; ACIOL anterior chamber intraocular lens; IOL intraocular lens; PCIOL posterior chamber intraocular lens; Phaco/IOL  phacoemulsification with intraocular lens placement; [place] photorefractive keratectomy; LASIK laser assisted in situ keratomileusis; HTN hypertension; DM diabetes mellitus; COPD chronic obstructive pulmonary disease

## 2020-05-22 ENCOUNTER — Other Ambulatory Visit: Payer: Self-pay | Admitting: Family Medicine

## 2020-06-17 ENCOUNTER — Other Ambulatory Visit: Payer: Self-pay

## 2020-06-17 ENCOUNTER — Ambulatory Visit: Payer: Medicare Other | Admitting: Podiatry

## 2020-06-17 DIAGNOSIS — B351 Tinea unguium: Secondary | ICD-10-CM | POA: Diagnosis not present

## 2020-06-17 DIAGNOSIS — M79674 Pain in right toe(s): Secondary | ICD-10-CM

## 2020-06-17 DIAGNOSIS — M79675 Pain in left toe(s): Secondary | ICD-10-CM

## 2020-06-23 NOTE — Progress Notes (Signed)
Subjective: 86 y.o. returns the office today for painful, elongated, thickened toenails which she cannot trim herself. Denies any redness or drainage around the nails. No open lesions or recent injury/falls.  Denies any acute changes since last appointment and no new complaints today. Denies any systemic complaints such as fevers, chills, nausea, vomiting.   PCP: Marin Olp, MD   Objective: AAO 3, NAD DP/PT pulses palpable, CRT less than 3 seconds Nails hypertrophic, dystrophic, elongated, brittle, discolored 10. There is tenderness overlying the nails 1-5 bilaterally. There is no surrounding erythema or drainage along the nail sites. No open lesions or pre-ulcerative lesions are identified. No pain with calf compression, swelling, warmth, erythema.  Assessment: Patient presents with symptomatic onychomycosis  Plan: -Treatment options including alternatives, risks, complications were discussed -Nails sharply debrided 10 without complication/bleeding. -Discussed daily foot inspection. If there are any changes, to call the office immediately.  -Follow-up in 3 months or sooner if any problems are to arise. In the meantime, encouraged to call the office with any questions, concerns, changes symptoms.  Celesta Gentile, DPM

## 2020-06-25 ENCOUNTER — Other Ambulatory Visit: Payer: Self-pay

## 2020-06-25 ENCOUNTER — Encounter (INDEPENDENT_AMBULATORY_CARE_PROVIDER_SITE_OTHER): Payer: Self-pay | Admitting: Ophthalmology

## 2020-06-25 ENCOUNTER — Ambulatory Visit (INDEPENDENT_AMBULATORY_CARE_PROVIDER_SITE_OTHER): Payer: Medicare Other | Admitting: Ophthalmology

## 2020-06-25 DIAGNOSIS — H353221 Exudative age-related macular degeneration, left eye, with active choroidal neovascularization: Secondary | ICD-10-CM

## 2020-06-25 DIAGNOSIS — H353132 Nonexudative age-related macular degeneration, bilateral, intermediate dry stage: Secondary | ICD-10-CM

## 2020-06-25 MED ORDER — BEVACIZUMAB CHEMO INJECTION 1.25MG/0.05ML SYRINGE FOR KALEIDOSCOPE
1.2500 mg | INTRAVITREAL | Status: AC | PRN
  Administered 2020-06-25: 1.25 mg via INTRAVITREAL

## 2020-06-25 NOTE — Progress Notes (Signed)
06/25/2020     CHIEF COMPLAINT Patient presents for Retina Follow Up   HISTORY OF PRESENT ILLNESS: [name] is a 86 y.o. female who presents to the clinic today for:   HPI    Retina Follow Up    Patient presents with  Wet AMD.  In left eye.  Severity is moderate.  Duration of 6 weeks.  Since onset it is stable.  I, the attending physician,  performed the HPI with the patient and updated documentation appropriately.          Comments    6 Week Wet AMD f\u OS. Possible Avastin OS. OCT  Pt states vision has been stable. Pt wants to know when she can get gls.       Last edited by Tilda Franco on 06/25/2020  2:31 PM. (History)      Referring physician: Marin Olp, [place],  [place] [address]  HISTORICAL INFORMATION:   Selected notes from the MEDICAL RECORD NUMBER    Lab Results  Component Value Date   HGBA1C 5.8 (H) 03/25/2015     CURRENT MEDICATIONS: No current outpatient medications on file. (Ophthalmic Drugs)   No current facility-administered medications for this visit. (Ophthalmic Drugs)   Current Outpatient Medications (Other)  Medication Sig  . acetaminophen (TYLENOL) 500 MG tablet Take 1 tablet (500 mg total) by mouth every 6 (six) hours as needed.  Marland Kitchen amLODipine (NORVASC) 5 MG tablet TAKE 1 TABLET BY MOUTH  DAILY  . atorvastatin (LIPITOR) 40 MG tablet TAKE 1 TABLET BY MOUTH AT  BEDTIME  . losartan (COZAAR) 100 MG tablet TAKE 1 TABLET BY MOUTH  DAILY (Patient taking differently: Take 100 mg by mouth at bedtime. )  . Multiple Vitamin (MULTIVITAMIN) tablet Take 1 tablet by mouth daily.     No current facility-administered medications for this visit. (Other)      REVIEW OF SYSTEMS:    ALLERGIES Allergies  Allergen Reactions  . Ace Inhibitors     REACTION: angioedema    PAST MEDICAL HISTORY Past Medical History:  Diagnosis Date  . Hypertension   . Osteopenia   . Stroke [facility])    Past Surgical History:    Procedure Laterality Date  . CATARACT EXTRACTION      FAMILY HISTORY Family History  Problem Relation Age of Onset  . Cancer Daughter        breast    SOCIAL HISTORY Social History   Tobacco Use  . Smoking status: Never Smoker  . Smokeless tobacco: Never Used  Vaping Use  . Vaping Use: Never used  Substance Use Topics  . Alcohol use: Yes    Alcohol/week: 1.0 standard drink    Types: 1 Glasses of wine per week    Comment: 1 glass with dinner  . Drug use: No         OPHTHALMIC EXAM:  Base Eye Exam    Visual Acuity (Snellen - Linear)      Right Left   Dist cc 20/50 + 20/200   Dist ph cc 20/30 -2 NI   Correction: Glasses       Tonometry (Tonopen, 2:38 PM)      Right Left   Pressure 16 14       Pupils      Pupils Dark Light Shape React APD   Right PERRL 7 7 Round Minimal None   Left PERRL 6 6 Round Minimal None  Visual Fields (Counting fingers)      Left Right    Full Full       Neuro/Psych    Oriented x3: Yes   Mood/Affect: Normal       Dilation    Left eye: 1.0% Mydriacyl, 2.5% Phenylephrine @ 2:38 PM        Slit Lamp and Fundus Exam    External Exam      Right Left   External Normal Normal       Slit Lamp Exam      Right Left   Lids/Lashes Normal Normal   Conjunctiva/Sclera White and quiet White and quiet   Cornea Clear Clear   Anterior Chamber Deep and quiet Deep and quiet   Iris Round and reactive Round and reactive   Lens Posterior chamber intraocular lens Posterior chamber intraocular lens   Anterior Vitreous Normal Normal       Fundus Exam      Right Left   Posterior Vitreous  Posterior vitreous detachment   Disc  Normal   C/D Ratio  0.35   Macula   Hard drusen, Pigmented atrophy, Advanced age related macular degeneration,  NO Subretinal hemorrhage   Vessels  Normal   Periphery  Normal          IMAGING AND PROCEDURES  Imaging and Procedures for 06/25/20  OCT, Retina - OU - Both Eyes       Right  Eye Quality was good. Scan locations included subfoveal. Central Foveal Thickness: 252. Progression has been stable. Findings include retinal drusen , abnormal foveal contour, no SRF, no IRF.   Left Eye Quality was good. Scan locations included subfoveal. Central Foveal Thickness: 325. Findings include abnormal foveal contour, cystoid macular edema, pigment epithelial detachment.   Notes OD, no signs of CNVM, OS will repeat injection today of intravitreal Avastin, and examination repeated in 5 weeks       Intravitreal Injection, Pharmacologic Agent - OS - Left Eye       Time Out 06/25/2020. 3:21 PM. Confirmed correct patient, procedure, site, and patient consented.   Anesthesia Topical anesthesia was used. Anesthetic medications included Akten 3.5%.   Procedure Preparation included Tobramycin 0.3%, 10% betadine to eyelids, 5% betadine to ocular surface. A supplied needle was used.   Injection:  1.25 mg Bevacizumab (AVASTIN) SOLN   NDC: 70360-001-02, Lot: [id number]   Route: Intravitreal, Site: Left Eye, Waste: 0 mg  Post-op Post injection exam found visual acuity of at least counting fingers. The patient tolerated the procedure well. There were no complications. Post injection medications were not given.                 ASSESSMENT/PLAN:  Exudative age-related macular degeneration of left eye with active choroidal neovascularization ([place]) Overall left eye much improved since onset of therapy August 2021 yet with slight increase in CME perifoveal in the nasal aspect as compared to 6 weeks previous.  We will repeat injection intravitreal Avastin today and examination left eye in 5 weeks  Intermediate stage nonexudative age-related macular degeneration of both eyes The nature of age--related macular degeneration was discussed with the patient as well as the distinction between dry and wet types. Checking an Amsler Grid daily with advice to return immediately should a distortion  develop, was given to the patient. The patient 's smoking status now and in the past was determined and advice based on the AREDS study was provided regarding the consumption of antioxidant supplements. AREDS 2 vitamin formulation  was recommended. Consumption of dark leafy vegetables and fresh fruits of various colors was recommended. Treatment modalities for wet macular degeneration particularly the use of intravitreal injections of anti-blood vessel growth factors was discussed with the patient. Avastin, Lucentis, and Eylea are the available options. On occasion, therapy includes the use of photodynamic therapy and thermal laser. Stressed to the patient do not rub eyes.  Patient was advised to check Amsler Grid daily and return immediately if changes are noted. Instructions on using the grid were given to the patient. All patient questions were answered.      ICD-10-CM   1. Exudative age-related macular degeneration of left eye with active choroidal neovascularization (HCC)  H35.3221 OCT, Retina - OU - Both Eyes    Intravitreal Injection, Pharmacologic Agent - OS - Left Eye    Bevacizumab (AVASTIN) SOLN 1.25 mg  2. Intermediate stage nonexudative age-related macular degeneration of both eyes  H35.3132     1.  Repeat injection intravitreal Avastin OS today.  2.  Follow-up in 5 weeks for evaluation left eye next May need to change medications after next visit if Avastin is not controlling this condition  3.  Ophthalmic Meds Ordered this visit:  Meds ordered this encounter  Medications  . Bevacizumab (AVASTIN) SOLN 1.25 mg       Return in about 5 weeks (around 07/30/2020) for dilate, OS, AVASTIN OCT.  There are no Patient Instructions on file for this visit.   Explained the diagnoses, plan, and follow up with the patient and they expressed understanding.  Patient expressed understanding of the importance of proper follow up care.   Clent Demark Ily Denno M.D. Diseases & Surgery of the Retina  and Vitreous Retina & Diabetic [place] 06/25/20     Abbreviations: M myopia (nearsighted); A astigmatism; H hyperopia (farsighted); P presbyopia; Mrx spectacle prescription;  CTL contact lenses; OD right eye; OS left eye; OU both eyes  XT exotropia; ET esotropia; PEK punctate epithelial keratitis; PEE punctate epithelial erosions; DES dry eye syndrome; MGD meibomian gland dysfunction; ATs artificial tears; PFAT's preservative free artificial tears; [place] nuclear sclerotic cataract; PSC posterior subcapsular cataract; ERM epi-retinal membrane; PVD posterior vitreous detachment; RD retinal detachment; DM diabetes mellitus; DR diabetic retinopathy; NPDR non-proliferative diabetic retinopathy; PDR proliferative diabetic retinopathy; CSME clinically significant macular edema; DME diabetic macular edema; dbh dot blot hemorrhages; CWS cotton wool spot; POAG primary open angle glaucoma; C/D cup-to-disc ratio; HVF humphrey visual field; GVF goldmann visual field; OCT optical coherence tomography; IOP intraocular pressure; BRVO Branch retinal vein occlusion; CRVO central retinal vein occlusion; CRAO central retinal artery occlusion; BRAO branch retinal artery occlusion; RT retinal tear; SB scleral buckle; PPV pars plana vitrectomy; VH Vitreous hemorrhage; PRP panretinal laser photocoagulation; IVK intravitreal kenalog; VMT vitreomacular traction; MH Macular hole;  NVD neovascularization of the disc; NVE neovascularization elsewhere; AREDS age related eye disease study; ARMD age related macular degeneration; POAG primary open angle glaucoma; EBMD epithelial/anterior basement membrane dystrophy; ACIOL anterior chamber intraocular lens; IOL intraocular lens; PCIOL posterior chamber intraocular lens; Phaco/IOL phacoemulsification with intraocular lens placement; [place] photorefractive keratectomy; LASIK laser assisted in situ keratomileusis; HTN hypertension; DM diabetes mellitus; COPD chronic obstructive pulmonary  disease

## 2020-06-25 NOTE — Assessment & Plan Note (Signed)
Overall left eye much improved since onset of therapy August 2021 yet with slight increase in CME perifoveal in the nasal aspect as compared to 6 weeks previous.  We will repeat injection intravitreal Avastin today and examination left eye in 5 weeks

## 2020-06-25 NOTE — Assessment & Plan Note (Signed)

## 2020-06-30 ENCOUNTER — Telehealth: Payer: Self-pay | Admitting: Physician Assistant

## 2020-06-30 NOTE — Telephone Encounter (Signed)
Called to discuss the homebound Covid-19 vaccination initiative with the patient and/or caregiver.   Message left to call back.  Devlyn Parish PA-C  [facility]     

## 2020-06-30 NOTE — Telephone Encounter (Signed)
I connected by phone with Melida Quitter and/or patient's caregiver on 06/30/2020 at 1:56 PM to discuss the potential vaccination through our Homebound vaccination initiative.   Prevaccination Checklist for COVID-19 Vaccines  1.  Are you feeling sick today? no  2.  Have you ever received a dose of a COVID-19 vaccine?  yes      If yes, which one? Pfizer   3.  Have you ever had an allergic reaction: (This would include a severe reaction [ e.g., anaphylaxis] that required treatment with epinephrine or EpiPen or that caused you to go to the hospital.  It would also include an allergic reaction that occurred within 4 hours that caused hives, swelling, or respiratory distress, including wheezing.) A.  A previous dose of COVID-19 vaccine. no  B.  A vaccine or injectable therapy that contains multiple components, one of which is a COVID-19 vaccine component, but it is not known which component elicited the immediate reaction. no  C.  Are you allergic to polyethylene glycol? no  D. Are you allergic to Polysorbate, which is found in some vaccines, film coated tablets and intravenous steroids?  no   4.  Have you ever had an allergic reaction to another vaccine (other than COVID-19 vaccine) or an injectable medication? (This would include a severe reaction [ e.g., anaphylaxis] that required treatment with epinephrine or EpiPen or that caused you to go to the hospital.  It would also include an allergic reaction that occurred within 4 hours that caused hives, swelling, or respiratory distress, including wheezing.)  no   5.  Have you ever had a severe allergic reaction (e.g., anaphylaxis) to something other than a component of the COVID-19 vaccine, or any vaccine or injectable medication?  This would include food, pet, venom, environmental, or oral medication allergies.  no   6.  Have you received any vaccine in the last 14 days? no   7.  Have you ever had a positive test for COVID-19 or has a doctor ever  told you that you had COVID-19?  no   8.  Have you received passive antibody therapy (monoclonal antibodies or convalescent serum) as a treatment for COVID-19? no   9.  Do you have a weakened immune system caused by something such as HIV infection or cancer or do you take immunosuppressive drugs or therapies?  no   10.  Do you have a bleeding disorder or are you taking a blood thinner? no   11.  Are you pregnant or breast-feeding? no   12.  Do you have dermal fillers? no   __________________   This patient is a 86 y.o. female that meets the FDA criteria to receive homebound vaccination. Patient or parent/caregiver understands they have the option to accept or refuse homebound vaccination.  Patient passed the pre-screening checklist and would like to proceed with homebound vaccination.  Based on questionnaire above, I recommend the patient be observed for 15 minutes.  There are an estimated 1 other household members/caregivers who are also interested in receiving the vaccine. Daughter wants booster as well. Her [place] dose was 5/10  I will send the patient's information to our scheduling team who will reach out to schedule the patient and potential caregiver/family members for homebound vaccination.   Angelena Form 06/30/2020 1:56 PM

## 2020-07-02 ENCOUNTER — Ambulatory Visit: Payer: Medicare Other | Attending: Family Medicine

## 2020-07-02 DIAGNOSIS — Z23 Encounter for immunization: Secondary | ICD-10-CM

## 2020-07-02 NOTE — Progress Notes (Signed)
   Covid-19 Vaccination Clinic  Name:  [name]    MRN: [id number] DOB: April 26, 1918  07/02/2020  [name] was observed post Covid-19 immunization for 15 minutes without incident. She was provided with Vaccine Information Sheet and instruction to access the V-Safe system.   [name] was instructed to call 911 with any severe reactions post vaccine: Marland Kitchen Difficulty breathing  . Swelling of face and throat  . A fast heartbeat  . A bad rash all over body  . Dizziness and weakness

## 2020-07-09 ENCOUNTER — Ambulatory Visit: Payer: Medicare Other | Admitting: Podiatry

## 2020-07-14 NOTE — Patient Instructions (Addendum)
Health Maintenance Due  Topic Date Due  . INFLUENZA VACCINE Has not received yet 03/24/2020   Depression screen [facility] 2/9 12/21/2019 09/20/2019 08/21/2019  Decreased Interest 0 0 0  Down, Depressed, Hopeless 1 2 0  PHQ - 2 Score 1 2 0  Altered sleeping 0 0 -  Tired, decreased energy 0 1 -  Change in appetite 0 0 -  Feeling bad or failure about yourself  1 0 -  Trouble concentrating 3 0 -  Moving slowly or fidgety/restless 0 0 -  Suicidal thoughts 0 0 -  PHQ-9 Score 5 3 -  Difficult doing work/chores Somewhat difficult Somewhat difficult -

## 2020-07-14 NOTE — Progress Notes (Signed)
Phone [phone] Virtual visit via Video note   Subjective:  Chief complaint: Chief Complaint  Patient presents with  . Hyperlipidemia  . Hypertension    This visit type was conducted due to national recommendations for restrictions regarding the COVID-19 Pandemic (e.g. social distancing).  This format is felt to be most appropriate for this patient at this time balancing risks to patient and risks to population by having him in for in person visit.  No physical exam was performed (except for noted visual exam or audio findings with Telehealth visits).    Our team/I connected with [name] at  1:00 PM EST by a video enabled telemedicine application ([email or web address] or caregility through epic) and verified that I am speaking with the correct person using two identifiers.  Location patient: [place] Location provider: [facility], office Persons participating in the virtual visit:  patient  Our team/I discussed the limitations of evaluation and management by telemedicine and the availability of in person appointments. In light of current covid-19 pandemic, patient also understands that we are trying to protect them by minimizing in office contact if at all possible.  The patient expressed consent for telemedicine visit and agreed to proceed. Patient understands insurance will be billed.   Past Medical History-  Patient Active Problem List   Diagnosis Date Noted  . Meningioma ([place]) 03/31/2018    Priority: High  . History of CVA in adulthood 03/31/2018    Priority: High  . Cerebral amyloid angiopathy ([place]) 05/29/2015    Priority: High  . Atrial fibrillation ([place]) 03/29/2015    Priority: High  . History of intracerebral hemorrhage      Priority: High  . Macular degeneration 04/16/2020    Priority: Medium  . Chronic kidney disease, stage III (moderate) (HCC) 08/01/2018    Priority: Medium  . Hyperlipidemia 03/15/2018    Priority: Medium  . Reactive depression 08/14/2015     Priority: Medium  . Essential hypertension 06/22/2006    Priority: Medium  . Urinary incontinence 04/28/2017    Priority: Low  . Intention tremor 06/21/2015    Priority: Low  . Dizzy spells 11/10/2014    Priority: Low  . SPINAL STENOSIS, CERVICAL 06/11/2010    Priority: Low  . Arthritis of right knee 06/22/2006    Priority: Low  . Osteopenia 06/22/2006    Priority: Low  . Exudative age-related macular degeneration of left eye with active choroidal neovascularization ([place]) 04/02/2020  . Intermediate stage nonexudative age-related macular degeneration of both eyes 04/02/2020    Medications- reviewed and updated Current Outpatient Medications  Medication Sig Dispense Refill  . acetaminophen (TYLENOL) 500 MG tablet Take 1 tablet (500 mg total) by mouth every 6 (six) hours as needed. 30 tablet 0  . amLODipine (NORVASC) 5 MG tablet TAKE 1 TABLET BY MOUTH  DAILY 90 tablet 3  . atorvastatin (LIPITOR) 40 MG tablet TAKE 1 TABLET BY MOUTH AT  BEDTIME 90 tablet 3  . losartan (COZAAR) 100 MG tablet TAKE 1 TABLET BY MOUTH  DAILY (Patient taking differently: Take 100 mg by mouth at bedtime. ) 90 tablet 3  . Multiple Vitamin (MULTIVITAMIN) tablet Take 1 tablet by mouth daily.       No current facility-administered medications for this visit.     Objective:  BP 133/60   Pulse 69   SpO2 96%  self reported vitals Gen: NAD, resting comfortably Lungs: nonlabored, normal respiratory rate  Skin: appears dry, no obvious rash     Assessment and Plan  #  Macular degeneration- she is responding really well to treatments to macular degeneration with Dr. Zadie Rhine. She did not want to come in today due to the rain.   #Anxiety/stress- aide comes in at 39 30 but they need consistency to help her husband and they worry about risk of falls. mornings can be hectic but then can feel better the rest of the day. Husband is doing better after PT/OT so he is doing better overall.   #Meningioma S: Discovered at  time of CVA July 2019.  Previously patient/family were open to repeating imaging but strongly against surgery- therefore may not be worth repeating imaging and intially patient was asymptomatic. Since at least last visit, Patient continues to get mild frontal headaches (could be getting mild low sugar- doesn't always feel like eating- if eats yogurt early she does feel better and we discussed ways to avoid low blood sugar/being hungry)- tylenol continues to help- no worsening pattern.  A/P:hopefully stable- at her age continues to opt out of repeat imaging as she would not want surgery.    #Atrial fibrillation/history of intracerebral hemorrhage on aspirin in 2016.  Later with stroke in July 2019 with history of cerebral amyloid angiopathy which increases risk of stroke-was then stressful.  And blood pressure was higher.  Some concern with atrial fibrillation that that may have been the cause/embolic.  No anticoagulation due to history of intracerebral hemorrhage S: Lingering issues for patient include mild dysarthria.  In regards to A. fib-anticoagulated with no medication due to risks.  Rate controlled without medication. No palpitations. No chest pain.  A/P: doing well without medication- continue to monitor.     #Hyperlipidemia S: Compliant with atorvastatin 40 mg.  Started after stroke in July 2019 though may have been embolic. Lab Results  Component Value Date   CHOL 139 05/05/2019   HDL 70.40 05/05/2019   LDLCALC 56 05/05/2019   LDLDIRECT 51.0 12/21/2019   TRIG 66.0 05/05/2019   CHOLHDL 2 05/05/2019  A/P: Well-controlled on last check-patient agrees to come back for labs in the next few weeks and also needs a flu shot   #Hypertension/CKD stage III S: Compliant with losartan 100 mg, amlodipine 5 mg.  History of edema on higher dose amlodipine.  Mainly 130s/70s with some variations from 947S to 962E systolic.    GFR has been stable in the 30-60 range.  Most recently in the 53s A/P:  Hypertension well-controlled on home checks-continue current medication  Chronic kidney disease stage III-continue to monitor-CMP when she comes back for labs  Recommended follow up: December 20, 2020 or later for physical-offered to see her perhaps midway through but she is undecided at this time  -patient is going to call back to schedule labs and flu shot on the same day Future Appointments  Date Time Provider [place]  08/01/2020  2:15 PM Rankin, Clent Demark, MD [facility] None  08/20/2020  1:45 PM Jacqualyn Posey Bonna Gains, DPM [facility]    Lab/Order associations:   ICD-10-CM   1. Essential hypertension  I10 CBC With Differential/Platelet    COMPLETE METABOLIC PANEL WITH GFR    Lipid Panel w/reflex Direct LDL  2. Hyperlipidemia, unspecified hyperlipidemia type  E78.5 CBC With Differential/Platelet    COMPLETE METABOLIC PANEL WITH GFR    Lipid Panel w/reflex Direct LDL  3. Stage 3 chronic kidney disease, unspecified whether stage 3a or 3b CKD (HCC)  N18.30   4. Meningioma ([place])  D32.9   5. History of CVA in adulthood  Z86.73  6. History of intracerebral hemorrhage   Z86.79    Return precautions advised.  Garret Reddish, MD

## 2020-07-15 ENCOUNTER — Other Ambulatory Visit: Payer: Self-pay

## 2020-07-15 ENCOUNTER — Telehealth (INDEPENDENT_AMBULATORY_CARE_PROVIDER_SITE_OTHER): Payer: Medicare Other | Admitting: Family Medicine

## 2020-07-15 ENCOUNTER — Encounter: Payer: Self-pay | Admitting: Family Medicine

## 2020-07-15 VITALS — BP 133/60 | HR 69

## 2020-07-15 DIAGNOSIS — I10 Essential (primary) hypertension: Secondary | ICD-10-CM

## 2020-07-15 DIAGNOSIS — N183 Chronic kidney disease, stage 3 unspecified: Secondary | ICD-10-CM | POA: Diagnosis not present

## 2020-07-15 DIAGNOSIS — D329 Benign neoplasm of meninges, unspecified: Secondary | ICD-10-CM

## 2020-07-15 DIAGNOSIS — E785 Hyperlipidemia, unspecified: Secondary | ICD-10-CM

## 2020-07-15 DIAGNOSIS — Z8673 Personal history of transient ischemic attack (TIA), and cerebral infarction without residual deficits: Secondary | ICD-10-CM

## 2020-07-15 DIAGNOSIS — F329 Major depressive disorder, single episode, unspecified: Secondary | ICD-10-CM

## 2020-07-15 DIAGNOSIS — Z8679 Personal history of other diseases of the circulatory system: Secondary | ICD-10-CM

## 2020-07-22 ENCOUNTER — Other Ambulatory Visit: Payer: Self-pay

## 2020-07-22 ENCOUNTER — Other Ambulatory Visit: Payer: Medicare Other

## 2020-07-22 DIAGNOSIS — E785 Hyperlipidemia, unspecified: Secondary | ICD-10-CM | POA: Diagnosis not present

## 2020-07-22 DIAGNOSIS — I10 Essential (primary) hypertension: Secondary | ICD-10-CM | POA: Diagnosis not present

## 2020-07-23 LAB — CBC WITH DIFFERENTIAL/PLATELET
Absolute Monocytes: 816 cells/uL (ref 200–950)
Basophils Absolute: 34 cells/uL (ref 0–200)
Basophils Relative: 0.4 %
Eosinophils Absolute: 43 cells/uL (ref 15–500)
Eosinophils Relative: 0.5 %
HCT: 37.3 % (ref 35.0–45.0)
Hemoglobin: 12.7 g/dL (ref 11.7–15.5)
Lymphs Abs: 901 cells/uL (ref 850–3900)
MCH: 30 pg (ref 27.0–33.0)
MCHC: 34 g/dL (ref 32.0–36.0)
MCV: 88.2 fL (ref 80.0–100.0)
MPV: 10.3 fL (ref 7.5–12.5)
Monocytes Relative: 9.6 %
Neutro Abs: 6707 cells/uL (ref 1500–7800)
Neutrophils Relative %: 78.9 %
Platelets: 204 10*3/uL (ref 140–400)
RBC: 4.23 10*6/uL (ref 3.80–5.10)
RDW: 13.2 % (ref 11.0–15.0)
Total Lymphocyte: 10.6 %
WBC: 8.5 10*3/uL (ref 3.8–10.8)

## 2020-07-23 LAB — COMPLETE METABOLIC PANEL WITH GFR
AG Ratio: 1.4 (calc) (ref 1.0–2.5)
ALT: 18 U/L (ref 6–29)
AST: 21 U/L (ref 10–35)
Albumin: 3.9 g/dL (ref 3.6–5.1)
Alkaline phosphatase (APISO): 65 U/L (ref 37–153)
BUN/Creatinine Ratio: 29 (calc) — ABNORMAL HIGH (ref 6–22)
BUN: 26 mg/dL — ABNORMAL HIGH (ref 7–25)
CO2: 27 mmol/L (ref 20–32)
Calcium: 8.8 mg/dL (ref 8.6–10.4)
Chloride: 106 mmol/L (ref 98–110)
Creat: 0.89 mg/dL — ABNORMAL HIGH (ref 0.60–0.88)
GFR, Est African American: 62 mL/min/{1.73_m2} (ref >=60)
GFR, Est Non African American: 53 mL/min/{1.73_m2} — ABNORMAL LOW (ref >=60)
Globulin: 2.8 g/dL (calc) (ref 1.9–3.7)
Glucose, Bld: 100 mg/dL — ABNORMAL HIGH (ref 65–99)
Potassium: 4.1 mmol/L (ref 3.5–5.3)
Sodium: 144 mmol/L (ref 135–146)
Total Bilirubin: 1 mg/dL (ref 0.2–1.2)
Total Protein: 6.7 g/dL (ref 6.1–8.1)

## 2020-07-23 LAB — LIPID PANEL W/REFLEX DIRECT LDL
Cholesterol: 135 mg/dL (ref <200)
HDL: 69 mg/dL (ref >=50)
LDL Cholesterol (Calc): 53 mg/dL (calc)
Non-HDL Cholesterol (Calc): 66 mg/dL (calc) (ref <130)
Total CHOL/HDL Ratio: 2 (calc) (ref <5.0)
Triglycerides: 58 mg/dL (ref <150)

## 2020-07-30 ENCOUNTER — Encounter (INDEPENDENT_AMBULATORY_CARE_PROVIDER_SITE_OTHER): Payer: Medicare Other | Admitting: Ophthalmology

## 2020-08-01 ENCOUNTER — Other Ambulatory Visit: Payer: Self-pay

## 2020-08-01 ENCOUNTER — Encounter (INDEPENDENT_AMBULATORY_CARE_PROVIDER_SITE_OTHER): Payer: Self-pay | Admitting: Ophthalmology

## 2020-08-01 ENCOUNTER — Ambulatory Visit (INDEPENDENT_AMBULATORY_CARE_PROVIDER_SITE_OTHER): Payer: Medicare Other | Admitting: Ophthalmology

## 2020-08-01 DIAGNOSIS — H353132 Nonexudative age-related macular degeneration, bilateral, intermediate dry stage: Secondary | ICD-10-CM | POA: Diagnosis not present

## 2020-08-01 DIAGNOSIS — H353221 Exudative age-related macular degeneration, left eye, with active choroidal neovascularization: Secondary | ICD-10-CM

## 2020-08-01 MED ORDER — BEVACIZUMAB 2.5 MG/0.1ML IZ SOSY
2.5000 mg | PREFILLED_SYRINGE | INTRAVITREAL | Status: AC | PRN
  Administered 2020-08-01: 2.5 mg via INTRAVITREAL

## 2020-08-01 NOTE — Progress Notes (Signed)
08/01/2020     CHIEF COMPLAINT Patient presents for Retina Follow Up (5 Wk F/U OS, POSS AVASTIN OS///Pt reports vision no clear, new floaters OS in the AM.////)   HISTORY OF PRESENT ILLNESS: [name] is a 86 y.o. female who presents to the clinic today for:   HPI    Retina Follow Up    Patient presents with  Wet AMD.  In left eye.  This started 5 weeks ago.  Severity is mild.  Duration of 5 weeks.  Since onset it is gradually worsening. Additional comments: 5 Wk F/U OS, POSS AVASTIN OS   Pt reports vision no clear, new floaters OS in the AM.           Last edited by Nichola Sizer D on 08/01/2020  2:34 PM. (History)      Referring physician: Marin Olp, MD [place],  [place] [address]  HISTORICAL INFORMATION:   Selected notes from the MEDICAL RECORD NUMBER    Lab Results  Component Value Date   HGBA1C 5.8 (H) 03/25/2015     CURRENT MEDICATIONS: No current outpatient medications on file. (Ophthalmic Drugs)   No current facility-administered medications for this visit. (Ophthalmic Drugs)   Current Outpatient Medications (Other)  Medication Sig  . acetaminophen (TYLENOL) 500 MG tablet Take 1 tablet (500 mg total) by mouth every 6 (six) hours as needed.  Marland Kitchen amLODipine (NORVASC) 5 MG tablet TAKE 1 TABLET BY MOUTH  DAILY  . atorvastatin (LIPITOR) 40 MG tablet TAKE 1 TABLET BY MOUTH AT  BEDTIME  . losartan (COZAAR) 100 MG tablet TAKE 1 TABLET BY MOUTH  DAILY (Patient taking differently: Take 100 mg by mouth at bedtime. )  . Multiple Vitamin (MULTIVITAMIN) tablet Take 1 tablet by mouth daily.     No current facility-administered medications for this visit. (Other)      REVIEW OF SYSTEMS:    ALLERGIES Allergies  Allergen Reactions  . Ace Inhibitors     REACTION: angioedema    PAST MEDICAL HISTORY Past Medical History:  Diagnosis Date  . Hypertension   . Osteopenia   . Stroke [facility])    Past Surgical History:  Procedure  Laterality Date  . CATARACT EXTRACTION      FAMILY HISTORY Family History  Problem Relation Age of Onset  . Cancer Daughter        breast    SOCIAL HISTORY Social History   Tobacco Use  . Smoking status: Never Smoker  . Smokeless tobacco: Never Used  Vaping Use  . Vaping Use: Never used  Substance Use Topics  . Alcohol use: Yes    Alcohol/week: 1.0 standard drink    Types: 1 Glasses of wine per week    Comment: 1 glass with dinner  . Drug use: No         OPHTHALMIC EXAM:  Base Eye Exam    Visual Acuity (ETDRS)      Right Left   Dist cc 20/25 +1 20/100 -2   Dist ph cc  20/100 -1   Correction: Glasses       Tonometry (Tonopen, 2:42 PM)      Right Left   Pressure 19 19       Pupils      Dark Light Shape React APD   Right 7 7 Round Minimal None   Left 6 6 Round Minimal None       Visual Fields (Counting fingers)  Left Right    Full Full       Extraocular Movement      Right Left    Full Full       Neuro/Psych    Oriented x3: Yes   Mood/Affect: Normal       Dilation    Left eye: 1.0% Mydriacyl, 2.5% Phenylephrine @ 2:42 PM        Slit Lamp and Fundus Exam    External Exam      Right Left   External Normal Normal       Slit Lamp Exam      Right Left   Lids/Lashes Normal Normal   Conjunctiva/Sclera White and quiet White and quiet   Cornea Clear Clear   Anterior Chamber Deep and quiet Deep and quiet   Iris Round and reactive Round and reactive   Lens Posterior chamber intraocular lens Posterior chamber intraocular lens   Anterior Vitreous Normal Normal       Fundus Exam      Right Left   Posterior Vitreous  Posterior vitreous detachment   Disc  Normal   C/D Ratio  0.35   Macula   Hard drusen, Pigmented atrophy, Advanced age related macular degeneration,  NO Subretinal hemorrhage   Vessels  Normal   Periphery  Normal          IMAGING AND PROCEDURES  Imaging and Procedures for 08/01/20  OCT, Retina - OU - Both Eyes        Right Eye Quality was good. Scan locations included subfoveal. Central Foveal Thickness: 248. Findings include normal observations, retinal drusen , no SRF, no IRF.   Left Eye Quality was good. Scan locations included subfoveal. Central Foveal Thickness: 328. Findings include pigment epithelial detachment.   Notes Much less CME much less intraretinal fluid left eye status post recent injection intravitreal Avastin some 5 weeks previous       Intravitreal Injection, Pharmacologic Agent - OS - Left Eye       Time Out 08/01/2020. 3:30 PM. Confirmed correct patient, procedure, site, and patient consented.   Anesthesia Topical anesthesia was used. Anesthetic medications included Akten 3.5%.   Procedure Preparation included Tobramycin 0.3%, 10% betadine to eyelids, 5% betadine to ocular surface. A supplied needle was used.   Injection:  2.5 mg Bevacizumab (AVASTIN) 2.5mg /0.36mL SOSY   NDC: 16073-710-62, Lot: [id number]   Route: Intravitreal, Site: Left Eye  Post-op Post injection exam found visual acuity of at least counting fingers. The patient tolerated the procedure well. There were no complications. The patient received written and verbal post procedure care education. Post injection medications were not given.                 ASSESSMENT/PLAN:  Exudative age-related macular degeneration of left eye with active choroidal neovascularization (HCC) Intravitreal Avastin repeated  5 weeks previous, with improved intraretinal fluid and improved CME secondary to CNVM and vascularized PED.  We will repeat injection Avastin OS today and examination in 5 weeks  Intermediate stage nonexudative age-related macular degeneration of both eyes No signs of CNVM OD by OCT examination today      ICD-10-CM   1. Exudative age-related macular degeneration of left eye with active choroidal neovascularization (HCC)  H35.3221 OCT, Retina - OU - Both Eyes    Intravitreal Injection,  Pharmacologic Agent - OS - Left Eye    bevacizumab (AVASTIN) SOSY 2.5 mg  2. Intermediate stage nonexudative age-related macular degeneration of both eyes  H35.3132  1.  Macular anatomy OS has improved with less CME from vascularized PED, CNVM, wet ARMD.  We will repeat injection Avastin today and maintain as close to 5-week follow-up initially before trying to extend the interval exam  2.  3.  Ophthalmic Meds Ordered this visit:  Meds ordered this encounter  Medications  . bevacizumab (AVASTIN) SOSY 2.5 mg       Return in about 5 weeks (around 09/05/2020) for dilate, OS, AVASTIN OCT.  There are no Patient Instructions on file for this visit.   Explained the diagnoses, plan, and follow up with the patient and they expressed understanding.  Patient expressed understanding of the importance of proper follow up care.   Clent Demark Rishan Oyama M.D. Diseases & Surgery of the Retina and Vitreous [facility]ina [facility] Diabetic [place] 08/01/20     Abbreviations: M myopia (nearsighted); A astigmatism; H hyperopia (farsighted); P presbyopia; Mrx spectacle prescription;  CTL contact lenses; OD right eye; OS left eye; OU both eyes  XT exotropia; ET esotropia; PEK punctate epithelial keratitis; PEE punctate epithelial erosions; DES dry eye syndrome; MGD meibomian gland dysfunction; ATs artificial tears; PFAT's preservative free artificial tears; [place] nuclear sclerotic cataract; PSC posterior subcapsular cataract; ERM epi-retinal membrane; PVD posterior vitreous detachment; RD retinal detachment; DM diabetes mellitus; DR diabetic retinopathy; NPDR non-proliferative diabetic retinopathy; PDR proliferative diabetic retinopathy; CSME clinically significant macular edema; DME diabetic macular edema; dbh dot blot hemorrhages; CWS cotton wool spot; POAG primary open angle glaucoma; C/D cup-to-disc ratio; HVF humphrey visual field; GVF goldmann visual field; OCT optical coherence tomography; IOP intraocular pressure;  BRVO Branch retinal vein occlusion; CRVO central retinal vein occlusion; CRAO central retinal artery occlusion; BRAO branch retinal artery occlusion; RT retinal tear; SB scleral buckle; PPV pars plana vitrectomy; VH Vitreous hemorrhage; PRP panretinal laser photocoagulation; IVK intravitreal kenalog; VMT vitreomacular traction; MH Macular hole;  NVD neovascularization of the disc; NVE neovascularization elsewhere; AREDS age related eye disease study; ARMD age related macular degeneration; POAG primary open angle glaucoma; EBMD epithelial/anterior basement membrane dystrophy; ACIOL anterior chamber intraocular lens; IOL intraocular lens; PCIOL posterior chamber intraocular lens; Phaco/IOL phacoemulsification with intraocular lens placement; [place] photorefractive keratectomy; LASIK laser assisted in situ keratomileusis; HTN hypertension; DM diabetes mellitus; COPD chronic obstructive pulmonary disease

## 2020-08-01 NOTE — Assessment & Plan Note (Signed)
Intravitreal Avastin repeated  5 weeks previous, with improved intraretinal fluid and improved CME secondary to CNVM and vascularized PED.  We will repeat injection Avastin OS today and examination in 5 weeks

## 2020-08-01 NOTE — Assessment & Plan Note (Signed)
No signs of CNVM OD by OCT examination today

## 2020-08-11 ENCOUNTER — Other Ambulatory Visit: Payer: Self-pay

## 2020-08-11 ENCOUNTER — Emergency Department (HOSPITAL_COMMUNITY): Payer: Medicare Other

## 2020-08-11 ENCOUNTER — Inpatient Hospital Stay (HOSPITAL_COMMUNITY)
Admission: EM | Admit: 2020-08-11 | Discharge: 2020-08-16 | DRG: 087 | Disposition: A | Discharge status: Home / Self Care | Payer: Medicare Other | Attending: Internal Medicine | Admitting: Internal Medicine

## 2020-08-11 ENCOUNTER — Encounter (HOSPITAL_COMMUNITY): Payer: Self-pay | Admitting: Obstetrics and Gynecology

## 2020-08-11 DIAGNOSIS — R609 Edema, unspecified: Secondary | ICD-10-CM | POA: Diagnosis not present

## 2020-08-11 DIAGNOSIS — I6200 Nontraumatic subdural hemorrhage, unspecified: Secondary | ICD-10-CM | POA: Diagnosis not present

## 2020-08-11 DIAGNOSIS — M79603 Pain in arm, unspecified: Secondary | ICD-10-CM | POA: Diagnosis not present

## 2020-08-11 DIAGNOSIS — M79602 Pain in left arm: Secondary | ICD-10-CM

## 2020-08-11 DIAGNOSIS — S0181XA Laceration without foreign body of other part of head, initial encounter: Secondary | ICD-10-CM | POA: Diagnosis not present

## 2020-08-11 DIAGNOSIS — M25532 Pain in left wrist: Secondary | ICD-10-CM | POA: Diagnosis not present

## 2020-08-11 DIAGNOSIS — E782 Mixed hyperlipidemia: Secondary | ICD-10-CM | POA: Diagnosis present

## 2020-08-11 DIAGNOSIS — N1831 Chronic kidney disease, stage 3a: Secondary | ICD-10-CM | POA: Diagnosis not present

## 2020-08-11 DIAGNOSIS — I129 Hypertensive chronic kidney disease with stage 1 through stage 4 chronic kidney disease, or unspecified chronic kidney disease: Secondary | ICD-10-CM | POA: Diagnosis present

## 2020-08-11 DIAGNOSIS — Y929 Unspecified place or not applicable: Secondary | ICD-10-CM

## 2020-08-11 DIAGNOSIS — S52132A Displaced fracture of neck of left radius, initial encounter for closed fracture: Secondary | ICD-10-CM | POA: Diagnosis not present

## 2020-08-11 DIAGNOSIS — M25529 Pain in unspecified elbow: Secondary | ICD-10-CM

## 2020-08-11 DIAGNOSIS — M25522 Pain in left elbow: Secondary | ICD-10-CM | POA: Diagnosis not present

## 2020-08-11 DIAGNOSIS — W010XXA Fall on same level from slipping, tripping and stumbling without subsequent striking against object, initial encounter: Secondary | ICD-10-CM | POA: Diagnosis present

## 2020-08-11 DIAGNOSIS — R0902 Hypoxemia: Secondary | ICD-10-CM | POA: Diagnosis not present

## 2020-08-11 DIAGNOSIS — W19XXXA Unspecified fall, initial encounter: Secondary | ICD-10-CM

## 2020-08-11 DIAGNOSIS — Z8673 Personal history of transient ischemic attack (TIA), and cerebral infarction without residual deficits: Secondary | ICD-10-CM

## 2020-08-11 DIAGNOSIS — R52 Pain, unspecified: Secondary | ICD-10-CM | POA: Diagnosis not present

## 2020-08-11 DIAGNOSIS — R000 Tachycardia, unspecified: Secondary | ICD-10-CM | POA: Diagnosis not present

## 2020-08-11 DIAGNOSIS — I10 Essential (primary) hypertension: Secondary | ICD-10-CM | POA: Diagnosis not present

## 2020-08-11 DIAGNOSIS — I340 Nonrheumatic mitral (valve) insufficiency: Secondary | ICD-10-CM | POA: Diagnosis not present

## 2020-08-11 DIAGNOSIS — Z8679 Personal history of other diseases of the circulatory system: Secondary | ICD-10-CM | POA: Diagnosis not present

## 2020-08-11 DIAGNOSIS — Z79899 Other long term (current) drug therapy: Secondary | ICD-10-CM

## 2020-08-11 DIAGNOSIS — S01112A Laceration without foreign body of left eyelid and periocular area, initial encounter: Secondary | ICD-10-CM

## 2020-08-11 DIAGNOSIS — J90 Pleural effusion, not elsewhere classified: Secondary | ICD-10-CM | POA: Diagnosis not present

## 2020-08-11 DIAGNOSIS — R55 Syncope and collapse: Secondary | ICD-10-CM | POA: Diagnosis not present

## 2020-08-11 DIAGNOSIS — S066X0A Traumatic subarachnoid hemorrhage without loss of consciousness, initial encounter: Secondary | ICD-10-CM | POA: Diagnosis not present

## 2020-08-11 DIAGNOSIS — S52122A Displaced fracture of head of left radius, initial encounter for closed fracture: Secondary | ICD-10-CM | POA: Diagnosis not present

## 2020-08-11 DIAGNOSIS — S066XAA Traumatic subarachnoid hemorrhage with loss of consciousness status unknown, initial encounter: Secondary | ICD-10-CM | POA: Diagnosis present

## 2020-08-11 DIAGNOSIS — I482 Chronic atrial fibrillation, unspecified: Secondary | ICD-10-CM | POA: Diagnosis not present

## 2020-08-11 DIAGNOSIS — Z86011 Personal history of benign neoplasm of the brain: Secondary | ICD-10-CM

## 2020-08-11 DIAGNOSIS — M7989 Other specified soft tissue disorders: Secondary | ICD-10-CM | POA: Diagnosis not present

## 2020-08-11 DIAGNOSIS — Z20822 Contact with and (suspected) exposure to covid-19: Secondary | ICD-10-CM | POA: Diagnosis present

## 2020-08-11 DIAGNOSIS — S065X9A Traumatic subdural hemorrhage with loss of consciousness of unspecified duration, initial encounter: Secondary | ICD-10-CM | POA: Diagnosis not present

## 2020-08-11 DIAGNOSIS — I160 Hypertensive urgency: Secondary | ICD-10-CM | POA: Diagnosis not present

## 2020-08-11 DIAGNOSIS — I361 Nonrheumatic tricuspid (valve) insufficiency: Secondary | ICD-10-CM | POA: Diagnosis not present

## 2020-08-11 DIAGNOSIS — S62102A Fracture of unspecified carpal bone, left wrist, initial encounter for closed fracture: Secondary | ICD-10-CM | POA: Diagnosis not present

## 2020-08-11 DIAGNOSIS — R404 Transient alteration of awareness: Secondary | ICD-10-CM | POA: Diagnosis not present

## 2020-08-11 DIAGNOSIS — S0101XA Laceration without foreign body of scalp, initial encounter: Secondary | ICD-10-CM | POA: Diagnosis not present

## 2020-08-11 DIAGNOSIS — Y92009 Unspecified place in unspecified non-institutional (private) residence as the place of occurrence of the external cause: Secondary | ICD-10-CM | POA: Diagnosis not present

## 2020-08-11 DIAGNOSIS — I959 Hypotension, unspecified: Secondary | ICD-10-CM | POA: Diagnosis not present

## 2020-08-11 DIAGNOSIS — I609 Nontraumatic subarachnoid hemorrhage, unspecified: Secondary | ICD-10-CM | POA: Diagnosis not present

## 2020-08-11 DIAGNOSIS — M19012 Primary osteoarthritis, left shoulder: Secondary | ICD-10-CM | POA: Diagnosis not present

## 2020-08-11 DIAGNOSIS — R410 Disorientation, unspecified: Secondary | ICD-10-CM | POA: Diagnosis not present

## 2020-08-11 DIAGNOSIS — I480 Paroxysmal atrial fibrillation: Secondary | ICD-10-CM | POA: Diagnosis not present

## 2020-08-11 DIAGNOSIS — J189 Pneumonia, unspecified organism: Secondary | ICD-10-CM

## 2020-08-11 DIAGNOSIS — S065XAA Traumatic subdural hemorrhage with loss of consciousness status unknown, initial encounter: Secondary | ICD-10-CM | POA: Diagnosis present

## 2020-08-11 DIAGNOSIS — M79642 Pain in left hand: Secondary | ICD-10-CM | POA: Diagnosis not present

## 2020-08-11 DIAGNOSIS — I6201 Nontraumatic acute subdural hemorrhage: Secondary | ICD-10-CM | POA: Diagnosis not present

## 2020-08-11 DIAGNOSIS — M79605 Pain in left leg: Secondary | ICD-10-CM | POA: Diagnosis not present

## 2020-08-11 DIAGNOSIS — I4891 Unspecified atrial fibrillation: Secondary | ICD-10-CM | POA: Diagnosis not present

## 2020-08-11 DIAGNOSIS — M25422 Effusion, left elbow: Secondary | ICD-10-CM | POA: Diagnosis not present

## 2020-08-11 DIAGNOSIS — Z8781 Personal history of (healed) traumatic fracture: Secondary | ICD-10-CM

## 2020-08-11 DIAGNOSIS — S065X0A Traumatic subdural hemorrhage without loss of consciousness, initial encounter: Secondary | ICD-10-CM | POA: Diagnosis not present

## 2020-08-11 DIAGNOSIS — S199XXA Unspecified injury of neck, initial encounter: Secondary | ICD-10-CM | POA: Diagnosis not present

## 2020-08-11 HISTORY — DX: Unspecified macular degeneration: H35.30

## 2020-08-11 MED ORDER — LIDOCAINE HCL (PF) 1 % IJ SOLN
5.0000 mL | Freq: Once | INTRAMUSCULAR | Status: AC
  Administered 2020-08-12: 01:00:00 5 mL
  Filled 2020-08-11: qty 30

## 2020-08-11 NOTE — ED Provider Notes (Signed)
[facility] Emergency Department Provider Note MRN:  [id number]  Arrival date & time: 08/12/20     Chief Complaint   Fall   History of Present Illness   [name] is a 86 y.o. year-old female with a history of hypertension, stroke presenting to the ED with chief complaint of fall.  Patient was turning around and lost her balance and fell. Sustained head trauma and laceration to the left brow, no loss of consciousness. Fell onto her left shoulder and is endorsing left shoulder pain. Denies neck or back pain, no chest pain or shortness of breath, no abdominal pain, no pain or injuries to the legs. Pain is moderate to severe in the shoulder, worse with motion or palpation.  Review of Systems  A complete 10 system review of systems was obtained and all systems are negative except as noted in the HPI and PMH.   Patient's Health History    Past Medical History:  Diagnosis Date  . Hypertension   . Macular degeneration of left eye   . Osteopenia   . Stroke [facility])     Past Surgical History:  Procedure Laterality Date  . CATARACT EXTRACTION      Family History  Problem Relation Age of Onset  . Cancer Daughter        breast    Social History   Socioeconomic History  . Marital status: Married    Spouse name: Not on file  . Number of children: Not on file  . Years of education: Not on file  . Highest education level: Not on file  Occupational History  . Not on file  Tobacco Use  . Smoking status: Never Smoker  . Smokeless tobacco: Never Used  Vaping Use  . Vaping Use: Never used  Substance and Sexual Activity  . Alcohol use: Yes    Alcohol/week: 1.0 standard drink    Types: 1 Glasses of wine per week    Comment: 1 glass with dinner  . Drug use: No  . Sexual activity: Not on file  Other Topics Concern  . Not on file  Social History Narrative   Married (husband Tom age 23 in [place] practice) 3. 2 [place] oldest daughter to  breast cancer. 4 grandkids.       Husband and patient live with daughter.       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       Hobbies: reading, had been quilting, enjoys cooking/baking.       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    Full Code.          Social Determinants of Health   Financial Resource Strain: Not on file  Food Insecurity: Not on file  Transportation Needs: Not on file  Physical Activity: Not on file  Stress: Not on file  Social Connections: Not on file  Intimate Partner Violence: Not on file     Physical Exam   Vitals:   08/11/20 2238 08/11/20 2241  BP:  (!) 158/83  Pulse:  68  Resp:  16  Temp:  97.9 F (36.6 C)  SpO2: 98% 98%    CONSTITUTIONAL: Chronically-appearing, NAD NEURO:  Alert and oriented x 3, no focal deficits EYES:  eyes equal and reactive ENT/NECK:  no LAD, no JVD CARDIO: Regular rate, well-perfused, normal S1 and S2 PULM:  CTAB no wheezing or rhonchi GI/GU:  normal bowel sounds, non-distended, non-tender MSK/SPINE:  No gross deformities, no edema, tenderness to palpation to  the left shoulder SKIN: 2 cm laceration to the left upper eyelid PSYCH:  Appropriate speech and behavior  *Additional and/or pertinent findings included in MDM below  Diagnostic and Interventional Summary    EKG Interpretation  Date/Time:    Ventricular Rate:    PR Interval:    QRS Duration:   QT Interval:    QTC Calculation:   R Axis:     Text Interpretation:        Labs Reviewed  CBC  BASIC METABOLIC PANEL  PROTIME-INR  APTT  TYPE AND SCREEN    CT HEAD WO CONTRAST  Final Result    CT CERVICAL SPINE WO CONTRAST  Final Result    CT MAXILLOFACIAL WO CONTRAST  Final Result    DG Humerus Left  Final Result      Medications  lidocaine (PF) (XYLOCAINE) 1 % injection 5 mL (has no administration in time range)     Procedures  /  Critical Care .Critical Care Performed by: Maudie Flakes, MD Authorized by: Maudie Flakes, MD   Critical care provider statement:    Critical care time (minutes):  35   Critical care was necessary to treat or prevent imminent or life-threatening deterioration of the following conditions: Subarachnoid hemorrhage.   Critical care was time spent personally by me on the following activities:  Discussions with consultants, evaluation of patient's response to treatment, examination of patient, ordering and performing treatments and interventions, ordering and review of laboratory studies, ordering and review of radiographic studies, pulse oximetry, re-evaluation of patient's condition, obtaining history from patient or surrogate and review of old charts .Marland KitchenLaceration Repair  Date/Time: 08/12/2020 2:33 AM Performed by: Maudie Flakes, MD Authorized by: Maudie Flakes, MD   Consent:    Consent obtained:  Verbal   Consent given by:  Patient   Risks, benefits, and alternatives were discussed: yes     Risks discussed:  Need for additional repair, infection, nerve damage, poor wound healing, poor cosmetic result, pain, retained foreign body, tendon damage and vascular damage Universal protocol:    Procedure explained and questions answered to patient or proxy's satisfaction: yes     Patient identity confirmed:  Verbally with patient Anesthesia:    Anesthesia method:  Local infiltration   Local anesthetic:  Lidocaine 1% w/o epi Laceration details:    Location:  Face   Face location:  L upper eyelid   Extent:  Superficial   Length (cm):  2.5   Depth (mm):  2 Pre-procedure details:    Preparation:  Patient was prepped and draped in usual sterile fashion Exploration:    Limited defect created (wound extended): yes     Hemostasis achieved with:  Direct pressure   Wound exploration: wound explored through full range of motion and entire depth of wound visualized     Contaminated: no   Treatment:    Area cleansed with:  Saline   Amount of cleaning:  Standard Skin repair:     Repair method:  Sutures   Suture size:  5-0   Suture material:  Plain gut   Suture technique:  Simple interrupted   Number of sutures:  4 Approximation:    Approximation:  Close Repair type:    Repair type:  Intermediate Post-procedure details:    Dressing:  Open (no dressing)   Procedure completion:  Tolerated well, no immediate complications    ED Course and Medical Decision Making  I have reviewed the triage vital signs, the nursing  notes, and pertinent available records from the EMR.  Listed above are laboratory and imaging tests that I personally ordered, reviewed, and interpreted and then considered in my medical decision making (see below for details).  Imaging pending, mechanical fall, will need suture repair.     CT imaging reveals subarachnoid and subdural hemorrhage.  Patient continues to have reassuring neurological exam, does not appear to be mass-effect or midline shift.  Discussed case with Dr. Venetia Constable of neurosurgery, recommending hospitalist admission for observation overnight.  Barth Kirks. Sedonia Small, [place] [email or web address]  Final Clinical Impressions(s) / ED Diagnoses     ICD-10-CM   1. SAH (subarachnoid hemorrhage) (HCC)  I60.9   2. Left eyelid laceration, initial encounter  S01.112A   3. Fall, initial encounter  W19.XXXA   4. Pain of left upper extremity  M79.602     ED Discharge Orders    None       Discharge Instructions Discussed with and Provided to Patient:   Discharge Instructions   None       Maudie Flakes, MD 08/12/20 [phone]

## 2020-08-11 NOTE — ED Triage Notes (Signed)
Patient is coming from home via EMS. Patient has a small laceration over the left eye per EMS from falling. Patient had a mechanical fall, reports she turned too quickly and toppled over trying to get to her husband. Patient also reports difficulty moving left arm and left arm pain.

## 2020-08-12 ENCOUNTER — Observation Stay (HOSPITAL_BASED_OUTPATIENT_CLINIC_OR_DEPARTMENT_OTHER): Payer: Medicare Other

## 2020-08-12 ENCOUNTER — Telehealth: Payer: Self-pay

## 2020-08-12 ENCOUNTER — Observation Stay (HOSPITAL_COMMUNITY): Payer: Medicare Other

## 2020-08-12 ENCOUNTER — Other Ambulatory Visit: Payer: Self-pay

## 2020-08-12 ENCOUNTER — Encounter (HOSPITAL_COMMUNITY): Payer: Self-pay | Admitting: Internal Medicine

## 2020-08-12 DIAGNOSIS — M79602 Pain in left arm: Secondary | ICD-10-CM | POA: Diagnosis not present

## 2020-08-12 DIAGNOSIS — W19XXXA Unspecified fall, initial encounter: Secondary | ICD-10-CM

## 2020-08-12 DIAGNOSIS — R55 Syncope and collapse: Secondary | ICD-10-CM

## 2020-08-12 DIAGNOSIS — S065X9A Traumatic subdural hemorrhage with loss of consciousness of unspecified duration, initial encounter: Secondary | ICD-10-CM | POA: Diagnosis not present

## 2020-08-12 DIAGNOSIS — I6201 Nontraumatic acute subdural hemorrhage: Secondary | ICD-10-CM | POA: Diagnosis not present

## 2020-08-12 DIAGNOSIS — M19012 Primary osteoarthritis, left shoulder: Secondary | ICD-10-CM | POA: Diagnosis not present

## 2020-08-12 DIAGNOSIS — I361 Nonrheumatic tricuspid (valve) insufficiency: Secondary | ICD-10-CM

## 2020-08-12 DIAGNOSIS — I340 Nonrheumatic mitral (valve) insufficiency: Secondary | ICD-10-CM | POA: Diagnosis not present

## 2020-08-12 DIAGNOSIS — N1831 Chronic kidney disease, stage 3a: Secondary | ICD-10-CM

## 2020-08-12 DIAGNOSIS — S065X0A Traumatic subdural hemorrhage without loss of consciousness, initial encounter: Secondary | ICD-10-CM | POA: Diagnosis not present

## 2020-08-12 DIAGNOSIS — I609 Nontraumatic subarachnoid hemorrhage, unspecified: Secondary | ICD-10-CM | POA: Diagnosis not present

## 2020-08-12 DIAGNOSIS — S065XAA Traumatic subdural hemorrhage with loss of consciousness status unknown, initial encounter: Secondary | ICD-10-CM | POA: Diagnosis present

## 2020-08-12 DIAGNOSIS — S066X0A Traumatic subarachnoid hemorrhage without loss of consciousness, initial encounter: Secondary | ICD-10-CM | POA: Diagnosis not present

## 2020-08-12 DIAGNOSIS — I160 Hypertensive urgency: Secondary | ICD-10-CM | POA: Diagnosis not present

## 2020-08-12 DIAGNOSIS — S066XAA Traumatic subarachnoid hemorrhage with loss of consciousness status unknown, initial encounter: Secondary | ICD-10-CM | POA: Diagnosis present

## 2020-08-12 DIAGNOSIS — J90 Pleural effusion, not elsewhere classified: Secondary | ICD-10-CM | POA: Diagnosis not present

## 2020-08-12 DIAGNOSIS — Y92009 Unspecified place in unspecified non-institutional (private) residence as the place of occurrence of the external cause: Secondary | ICD-10-CM

## 2020-08-12 DIAGNOSIS — E782 Mixed hyperlipidemia: Secondary | ICD-10-CM

## 2020-08-12 LAB — COMPREHENSIVE METABOLIC PANEL
ALT: 17 U/L (ref 0–44)
AST: 23 U/L (ref 15–41)
Albumin: 3.3 g/dL — ABNORMAL LOW (ref 3.5–5.0)
Alkaline Phosphatase: 53 U/L (ref 38–126)
Anion gap: 12 (ref 5–15)
BUN: 20 mg/dL (ref 8–23)
CO2: 25 mmol/L (ref 22–32)
Calcium: 8.1 mg/dL — ABNORMAL LOW (ref 8.9–10.3)
Chloride: 106 mmol/L (ref 98–111)
Creatinine, Ser: 0.7 mg/dL (ref 0.44–1.00)
GFR, Estimated: >60 mL/min (ref >60)
Glucose, Bld: 119 mg/dL — ABNORMAL HIGH (ref 70–99)
Potassium: 3.5 mmol/L (ref 3.5–5.1)
Sodium: 143 mmol/L (ref 135–145)
Total Bilirubin: 0.8 mg/dL (ref 0.3–1.2)
Total Protein: 6 g/dL — ABNORMAL LOW (ref 6.5–8.1)

## 2020-08-12 LAB — CBC WITH DIFFERENTIAL/PLATELET
Abs Immature Granulocytes: 0.02 10*3/uL (ref 0.00–0.07)
Basophils Absolute: 0 10*3/uL (ref 0.0–0.1)
Basophils Relative: 0 %
Eosinophils Absolute: 0.1 10*3/uL (ref 0.0–0.5)
Eosinophils Relative: 1 %
HCT: 29.9 % — ABNORMAL LOW (ref 36.0–46.0)
Hemoglobin: 9.6 g/dL — ABNORMAL LOW (ref 12.0–15.0)
Immature Granulocytes: 0 %
Lymphocytes Relative: 13 %
Lymphs Abs: 1 10*3/uL (ref 0.7–4.0)
MCH: 29.3 pg (ref 26.0–34.0)
MCHC: 32.1 g/dL (ref 30.0–36.0)
MCV: 91.2 fL (ref 80.0–100.0)
Monocytes Absolute: 1.1 10*3/uL — ABNORMAL HIGH (ref 0.1–1.0)
Monocytes Relative: 14 %
Neutro Abs: 5.7 10*3/uL (ref 1.7–7.7)
Neutrophils Relative %: 72 %
Platelets: 174 10*3/uL (ref 150–400)
RBC: 3.28 MIL/uL — ABNORMAL LOW (ref 3.87–5.11)
RDW: 14.3 % (ref 11.5–15.5)
WBC: 7.9 10*3/uL (ref 4.0–10.5)
nRBC: 0 % (ref 0.0–0.2)

## 2020-08-12 LAB — BASIC METABOLIC PANEL
Anion gap: 10 (ref 5–15)
BUN: 24 mg/dL — ABNORMAL HIGH (ref 8–23)
CO2: 25 mmol/L (ref 22–32)
Calcium: 8.6 mg/dL — ABNORMAL LOW (ref 8.9–10.3)
Chloride: 106 mmol/L (ref 98–111)
Creatinine, Ser: 0.86 mg/dL (ref 0.44–1.00)
GFR, Estimated: >60 mL/min (ref >60)
Glucose, Bld: 134 mg/dL — ABNORMAL HIGH (ref 70–99)
Potassium: 3.9 mmol/L (ref 3.5–5.1)
Sodium: 141 mmol/L (ref 135–145)

## 2020-08-12 LAB — ECHOCARDIOGRAM COMPLETE
Calc EF: 59.7 %
S' Lateral: 2.5 cm
Single Plane A2C EF: 60.7 %
Single Plane A4C EF: 57.7 %

## 2020-08-12 LAB — GLUCOSE, CAPILLARY: Glucose-Capillary: 112 mg/dL — ABNORMAL HIGH (ref 70–99)

## 2020-08-12 LAB — CBC
HCT: 37.3 % (ref 36.0–46.0)
Hemoglobin: 12.2 g/dL (ref 12.0–15.0)
MCH: 29.3 pg (ref 26.0–34.0)
MCHC: 32.7 g/dL (ref 30.0–36.0)
MCV: 89.7 fL (ref 80.0–100.0)
Platelets: 170 10*3/uL (ref 150–400)
RBC: 4.16 MIL/uL (ref 3.87–5.11)
RDW: 14.4 % (ref 11.5–15.5)
WBC: 10 10*3/uL (ref 4.0–10.5)
nRBC: 0 % (ref 0.0–0.2)

## 2020-08-12 LAB — MAGNESIUM: Magnesium: 1.9 mg/dL (ref 1.7–2.4)

## 2020-08-12 LAB — TYPE AND SCREEN
ABO/RH(D): B POS
Antibody Screen: NEGATIVE

## 2020-08-12 LAB — RESP PANEL BY RT-PCR (FLU A&B, COVID) ARPGX2
Influenza A by PCR: NEGATIVE
Influenza B by PCR: NEGATIVE
SARS Coronavirus 2 by RT PCR: NEGATIVE

## 2020-08-12 LAB — ABO/RH: ABO/RH(D): B POS

## 2020-08-12 LAB — PROTIME-INR
INR: 1.1 (ref 0.8–1.2)
Prothrombin Time: 14.1 seconds (ref 11.4–15.2)

## 2020-08-12 LAB — APTT: aPTT: 31 seconds (ref 24–36)

## 2020-08-12 MED ORDER — ONDANSETRON HCL 4 MG PO TABS: 4.0000 mg | ORAL_TABLET | Freq: Four times a day (QID) | ORAL | Status: DC | PRN

## 2020-08-12 MED ORDER — LABETALOL HCL 5 MG/ML IV SOLN
20.0000 mg | INTRAVENOUS | Status: DC | PRN
  Administered 2020-08-12: 04:00:00 20 mg via INTRAVENOUS
  Filled 2020-08-12: qty 4

## 2020-08-12 MED ORDER — ONDANSETRON HCL 4 MG/2ML IJ SOLN: 4.0000 mg | Freq: Four times a day (QID) | INTRAMUSCULAR | Status: DC | PRN

## 2020-08-12 MED ORDER — ACETAMINOPHEN 325 MG PO TABS
650.0000 mg | ORAL_TABLET | Freq: Four times a day (QID) | ORAL | Status: DC | PRN
  Administered 2020-08-12 – 2020-08-13 (×4): 650 mg via ORAL
  Filled 2020-08-12 (×4): qty 2

## 2020-08-12 MED ORDER — AMLODIPINE BESYLATE 5 MG PO TABS
5.0000 mg | ORAL_TABLET | Freq: Every day | ORAL | Status: DC
  Administered 2020-08-12 – 2020-08-13 (×2): 5 mg via ORAL
  Filled 2020-08-12 (×2): qty 1

## 2020-08-12 MED ORDER — LOSARTAN POTASSIUM 50 MG PO TABS
100.0000 mg | ORAL_TABLET | Freq: Every day | ORAL | Status: DC
  Administered 2020-08-12 – 2020-08-15 (×4): 100 mg via ORAL
  Filled 2020-08-12 (×4): qty 2

## 2020-08-12 MED ORDER — ATORVASTATIN CALCIUM 40 MG PO TABS
40.0000 mg | ORAL_TABLET | Freq: Every day | ORAL | Status: DC
  Administered 2020-08-12 – 2020-08-15 (×4): 40 mg via ORAL
  Filled 2020-08-12 (×4): qty 1

## 2020-08-12 MED ORDER — POLYETHYLENE GLYCOL 3350 17 G PO PACK
17.0000 g | PACK | Freq: Every day | ORAL | Status: DC | PRN
Start: 2020-08-12 — End: 2020-08-16

## 2020-08-12 MED ORDER — ACETAMINOPHEN 650 MG RE SUPP: 650.0000 mg | Freq: Four times a day (QID) | RECTAL | Status: DC | PRN

## 2020-08-12 NOTE — Telephone Encounter (Signed)
Patients daughter is requesting to look into the next form of care her parents will need patient at [facility], [facility] ) patient fell over the weekend and is in ER with sutures and a small bleed on the brain. Patients husband fell last week as well,daughter would like to discuss next type of care they need.

## 2020-08-12 NOTE — ED Notes (Signed)
Pt placed on bed pan. Pt had a solid BM and urinated. This RN performed pericare

## 2020-08-12 NOTE — ED Notes (Signed)
Called report to Holly, RN.

## 2020-08-12 NOTE — Telephone Encounter (Signed)
They both just got out of hospital they have a virtual scheduled tomorrow

## 2020-08-12 NOTE — Evaluation (Signed)
Physical Therapy Evaluation Patient Details Name: [name] MRN: [id number] DOB: 06-01-1918 Today's Date: 08/12/2020   History of Present Illness  Patient  86 year old female with history of right temporal intracranial hemorrhage with hypertensive crisis in 2016, hypertension, macular degeneration, meningioma, paroxysmal atrial fibrillation, stable amyloid angiopathy, hyperlipidemia, CKD stage III presented to [facility] long hospital after a fall at home.  Patient says she was in dining room yesterday evening getting a cookie for her husband when she suddenly felt her foot got stuck and fell to the floor.  There was no loss of consciousness.  She was brought to ED 08/11/2020.  CT head showed subarachnoid hemorrhage in the sulcus of the left frontal lobe as well as subdural hemorrhage over the left frontoparietal convexity measuring 4 cm in maximum thickness.  Clinical Impression   Pt presents with great pain in L lateral humerus area ( around deltoid insertion area) with palpation and any movement, limiting her mobility greatly ( bed mobility transfers and ambulation). Pt used RW at baseline and at this time will not be able to due to pain. Pt prefers arm flexed and by herself for least amount of pain, unable t reach for RW at this time. Pt did stand at bedside and sidestep R/L 2-3 steps each direction, 1 step forward and backward with forearm assist in R LE for support by PT. Feel pt will progress well, unsure of L UE pain at this time.   Spoke with dtr on phone, pt at baseline independent in their apartment below dtrs home , uses RW for safety, active, still does her banking and check books, etc. They have a caregiver that comes each day to assist pt's husband at [facility] level, and may try to increase this help and assistance if need.  Recommend pt receive post acute PT at [facility] level, or at home with HHPTif family able to assist pt at this level. Hopefully she will progress over these next few days.       Follow Up Recommendations SNF (pt and dtr would prefer home, depending pt's progress. Dtr tryng to set up extra help as well.IF home will recommend HHPT)    Equipment Recommendations  Other (comment) (they have all equipment they need, even if she has to be at [facility] level for a few days.)    Recommendations for Other Services       Precautions / Restrictions Precautions Precautions: Fall Restrictions Weight Bearing Restrictions: No      Mobility  Bed Mobility Overal bed mobility: Needs Assistance Bed Mobility: Supine to Sit;Sit to Supine;Rolling Rolling: Mod assist   Supine to sit: Mod assist Sit to supine: Mod assist   General bed mobility comments: limited and needed assist due to L UE pain and could not utilize the L UE for bed mobility    Transfers Overall transfer level: Needs assistance Equipment used: Rolling walker (2 wheeled) Transfers: Sit to/from Stand Sit to Stand: Min assist         General transfer comment: tried using RW , however could not even reach for the L hand grip, so kept the L UE in elbow flexion and by herself to decrease pain and movment, and tried to do hand held assist support on R UE for sit to stand  Ambulation/Gait Ambulation/Gait assistance: Mod assist Gait Distance (Feet): 3 Feet Assistive device: 1 person hand held assist       General Gait Details: pt in great pain with L UE , however tried to support  her R UE with forearm hand held assist for sidestepping R/ L 2-3 steps and 1 step forward and 1 step backward before she had to sit down.  Stairs            Wheelchair Mobility    Modified Rankin (Stroke Patients Only)       Balance Overall balance assessment: Needs assistance Sitting-balance support: Feet supported Sitting balance-Leahy Scale: Fair     Standing balance support: During functional activity Standing balance-Leahy Scale: Poor                               Pertinent Vitals/Pain Pain  Assessment: 0-10 Pain Score: 10-Worst pain ever Pain Location: L proximal lateral humeral shaft almost at deltoid insertion area. Very painful to light touch and any movement even PROM and increased painwith AROM and unable to perform Pain Descriptors / Indicators: Stabbing;Sharp;Shooting;Sore Pain Intervention(s): Limited activity within patient's tolerance;Monitored during session;Repositioned    Home Living Family/patient expects to be discharged to:: Private residence Living Arrangements: Spouse/significant other;Children Available Help at Discharge: Family;Personal care attendant Type of Home: House Home Access: Stairs to enter Entrance Stairs-Rails: Left Entrance Stairs-Number of Steps: 15, but there is also a chair lift on these steps Home Layout: One level Home Equipment: Grab bars - tub/shower;Shower seat - built in;Grab bars - toilet;Walker - 2 wheels;Wheelchair - manual Additional Comments: Pt and and spouse live in a basement apartment in her daughter and son-in-law's house. Everythin is very handicap accessible due to her husband also in a WC. They enter into their daughter's house and then do have to go down 16 steps to their apartment, but there is a chair lift. Pt has alwyas used the steps becuase she likes the exercise. They have a caregiver that comes every morning, but the dtr stated they are adding more help and increase time to help them both if needed. They have life alert and multiple safety features. Pt used RW at baseline in the apratment but really liked to stay active and on her feet. Still does her banking, etc. Very active and sharp for her age.    Prior Function Level of Independence: Independent with assistive device(s)         Comments: PT is ModI uses RW for safety in the home.     Hand Dominance        Extremity/Trunk Assessment        Lower Extremity Assessment Lower Extremity Assessment: Generalized weakness       Communication    Communication: No difficulties;Other (comment) (some occassional work recall difficulty but this is baseline from the  2016 CVA)  Cognition Arousal/Alertness: Awake/alert Behavior During Therapy: WFL for tasks assessed/performed Overall Cognitive Status: Within Functional Limits for tasks assessed                                 General Comments: Pt very hihg level cognitively for her age , some word recall difficulties but eventually got to the word . This is baseline from 2016 CVA      General Comments      Exercises     Assessment/Plan    PT Assessment Patient needs continued PT services  PT Problem List Decreased strength;Decreased mobility;Decreased activity tolerance;Decreased balance;Decreased range of motion       PT Treatment Interventions Gait training;Functional mobility training;Therapeutic activities;Therapeutic exercise;Balance training;Patient/family education  PT Goals (Current goals can be found in the Care Plan section)  Acute Rehab PT Goals Patient Stated Goal: I want to be able to enjoy the holiday and not be a burden to anyone. PT Goal Formulation: With patient Time For Goal Achievement: 08/26/20 Potential to Achieve Goals: Good    Frequency Min 3X/week   Barriers to discharge        Co-evaluation               AM-PAC PT "6 Clicks" Mobility  Outcome Measure Help needed turning from your back to your side while in a flat bed without using bedrails?: A Lot Help needed moving from lying on your back to sitting on the side of a flat bed without using bedrails?: A Lot Help needed moving to and from a bed to a chair (including a wheelchair)?: A Lot Help needed standing up from a chair using your arms (e.g., wheelchair or bedside chair)?: A Lot Help needed to walk in hospital room?: A Lot Help needed climbing 3-5 steps with a railing? : A Lot 6 Click Score: 12    End of Session Equipment Utilized During Treatment: Gait  belt Activity Tolerance: Patient limited by pain (pt was also very tired from long night in the ED . yawning a lot) Patient left: in bed (ED stretcher) Nurse Communication: Mobility status PT Visit Diagnosis: Unsteadiness on feet (R26.81);Other abnormalities of gait and mobility (R26.89)    Time: 1415-1501 PT Time Calculation (min) (ACUTE ONLY): 46 min   Charges:   PT Evaluation $PT Eval Low Complexity: 1 Low PT Treatments $Therapeutic Activity: 8-22 mins        Sheranda Seabrooks, PT, MPT Acute Rehabilitation Services Office: [phone] Pager: [phone] 08/12/2020   Clide Dales 08/12/2020, 4:10 PM

## 2020-08-12 NOTE — ED Notes (Signed)
PT is bedside to evaluate. Waiting to call report to floor based on their assessment

## 2020-08-12 NOTE — Telephone Encounter (Signed)
Noted! Thank you

## 2020-08-12 NOTE — Progress Notes (Signed)
86 year old female status post mechanical fall with resultant blow to her head.  No loss of consciousness.  No symptoms of numbness paresthesias or weakness.  No seizure.  Patient awake and aware and neurologically intact according to emergency department.  Head CT scan with a small amount of traumatic subarachnoid hemorrhage within a left frontal sulcus.  No evidence of significant parenchymal injury.  No mass-effect.  No fracture or other significant evidence of trauma.  Minimal posttraumatic subarachnoid hemorrhage without obvious neurologic symptoms.  At this point given her advanced age and frailty I would certainly would not recommend any type of aggressive care.  I think the patient should be evaluated with regard to ongoing fall risk and possible need for higher level of care than what she is receiving right now at home.  I do not see any indication for follow-up imaging unless the patient significantly worsens.

## 2020-08-12 NOTE — H&P (Signed)
History and Physical    [name] ZJI:[id number] DOB: 1917-12-05 DOA: 08/11/2020  PCP: Marin Olp, MD  Patient coming from: Home   Chief Complaint:  Chief Complaint  Patient presents with  . Fall     HPI:    86 year old female with past medical history right temporal intracranial hemorrhage with hypertensive crisis in 2016, hypertension, macular degeneration, meningioma, paroxysmal atrial fibrillation, cerebral amyloid angiopathy, hyperlipidemia and chronic kidney disease stage IIIa presenting to [facility] long hospital emergency fall.  Patient explains that she was in the dining room yesterday evening getting a cookie for her husband when she suddenly "felt stuck in a box" and fell to the floor.  Patient denies any loss of consciousness.  Patient denies any self urination, self defecation or tongue biting during this episode.  Patient reports that she struck her head on the ground she fell as well as her left shoulder.  Patient immediately began to bleed from the left supraorbital region.  Upon further questioning, patient denies any palpitations chest pain or any prodrome prior to her fall.  Patient denies any malaise, weakness, fevers, nausea, vomiting, sick contacts, dysuria, diarrhea, abdominal pain recent travel or confirmed contact with COVID-19 in the past several days.  Upon evaluation in the emergency department, patient's laceration in the left suprapubic region was sutured by the emergency room provider.  CT imaging of the head revealed a subarachnoid hemorrhage in the sulcus of the left frontal lobe as well as subdural hemorrhage over the left frontoparietal convexity measuring 4 cm in maximum thickness.  Additionally, there is a trace subdural hemorrhage layering along the left tentorium 3 mm in maximal thickness.  Case was discussed between the ER provider and Dr. Sherlyn Lick with neurosurgery who recommended hospitalization the patient on the medicine service for  observation overnight with neurosurgery presumably to follow along in the morning.  The hospitalist group was then called to assess the patient  Review of Systems:   Review of Systems  Musculoskeletal: Positive for falls.  All other systems reviewed and are negative.   Past Medical History:  Diagnosis Date  . Hypertension   . Macular degeneration of left eye   . Osteopenia   . Stroke [facility])     Past Surgical History:  Procedure Laterality Date  . CATARACT EXTRACTION       reports that she has never smoked. She has never used smokeless tobacco. She reports current alcohol use of about 1.0 standard drink of alcohol per week. She reports that she does not use drugs.  Allergies  Allergen Reactions  . Ace Inhibitors     REACTION: angioedema    Family History  Problem Relation Age of Onset  . Cancer Daughter        breast     Prior to Admission medications   Medication Sig Start Date End Date Taking? Authorizing Provider  amLODipine (NORVASC) 5 MG tablet TAKE 1 TABLET BY MOUTH  DAILY 05/23/20  Yes Marin Olp, MD  atorvastatin (LIPITOR) 40 MG tablet TAKE 1 TABLET BY MOUTH AT  BEDTIME 03/04/20  Yes Marin Olp, MD  losartan (COZAAR) 100 MG tablet TAKE 1 TABLET BY MOUTH  DAILY Patient taking differently: Take 100 mg by mouth at bedtime. 10/02/19  Yes Marin Olp, MD  acetaminophen (TYLENOL) 500 MG tablet Take 1 tablet (500 mg total) by mouth every 6 (six) hours as needed. 11/23/19   Fawze, Mina A, PA-C  Multiple Vitamin (MULTIVITAMIN) tablet Take 1 tablet by  mouth daily.    [provider]    Physical Exam: Vitals:   08/11/20 2238 08/11/20 2241 08/12/20 0300 08/12/20 0415  BP:  (!) 158/83 (!) 187/145 (!) 133/59  Pulse:  68 74 (!) 56  Resp:  16 18 17   Temp:  97.9 F (36.6 C)    TempSrc:  Oral    SpO2: 98% 98% 98% 97%    Constitutional: Acute alert and oriented x3, no associated distress.   Skin: Repaired laceration of the left supraorbital  region.  No rashes, no lesions, poor skin turgor noted. Eyes: Pupils are equally reactive to light.  No evidence of scleral icterus or conjunctival pallor.  ENMT: Moist mucous membranes noted.  Posterior pharynx clear of any exudate or lesions.   Neck: normal, supple, no masses, no thyromegaly.  No evidence of jugular venous distension.   Respiratory: clear to auscultation bilaterally, no wheezing, no crackles. Normal respiratory effort. No accessory muscle use.  Cardiovascular: Regular rate and rhythm, no murmurs / rubs / gallops. No extremity edema. 2+ pedal pulses. No carotid bruits.  Chest:   Nontender without crepitus or deformity.   Back:   Nontender without crepitus or deformity. Abdomen: Abdomen is soft and nontender.  No evidence of intra-abdominal masses.  Positive bowel sounds noted in all quadrants.   Musculoskeletal: Pain with both passive and active range of motion left upper extremity.  No joint deformity upper and lower extremities.  no contractures. Normal muscle tone.  Neurologic: CN 2-12 grossly intact. Sensation intact.  Patient moving all 4 extremities spontaneously.  Patient is following all commands.  Patient is responsive to verbal stimuli.   Psychiatric: Patient exhibits normal mood with appropriate affect.  Patient seems to possess insight as to their current situation.     Labs on Admission: I have personally reviewed following labs and imaging studies -   CBC: Recent Labs  Lab 08/12/20 0209 08/12/20 0628  WBC 10.0 7.9  NEUTROABS  --  5.7  HGB 12.2 9.6*  HCT 37.3 29.9*  MCV 89.7 91.2  PLT 170 841   Basic Metabolic Panel: Recent Labs  Lab 08/12/20 0209  NA 141  K 3.9  CL 106  CO2 25  GLUCOSE 134*  BUN 24*  CREATININE 0.86  CALCIUM 8.6*   GFR: CrCl cannot be calculated (Unknown ideal weight.). Liver Function Tests: No results for input(s): AST, ALT, ALKPHOS, BILITOT, PROT, ALBUMIN in the last 168 hours. No results for input(s): LIPASE, AMYLASE in  the last 168 hours. No results for input(s): AMMONIA in the last 168 hours. Coagulation Profile: Recent Labs  Lab 08/12/20 0209  INR 1.1   Cardiac Enzymes: No results for input(s): CKTOTAL, CKMB, CKMBINDEX, TROPONINI in the last 168 hours. BNP (last 3 results) No results for input(s): PROBNP in the last 8760 hours. HbA1C: No results for input(s): HGBA1C in the last 72 hours. CBG: No results for input(s): GLUCAP in the last 168 hours. Lipid Profile: No results for input(s): CHOL, HDL, LDLCALC, TRIG, CHOLHDL, LDLDIRECT in the last 72 hours. Thyroid Function Tests: No results for input(s): TSH, T4TOTAL, FREET4, T3FREE, THYROIDAB in the last 72 hours. Anemia Panel: No results for input(s): VITAMINB12, FOLATE, FERRITIN, TIBC, IRON, RETICCTPCT in the last 72 hours. Urine analysis:    Component Value Date/Time   COLORURINE YELLOW 01/31/2020 1401   APPEARANCEUR CLEAR 01/31/2020 1401   LABSPEC <1.005 (L) 01/31/2020 1401   PHURINE 6.5 01/31/2020 1401   GLUCOSEU NEGATIVE 01/31/2020 1401   GLUCOSEU NEGATIVE 04/28/2017  [place] 01/31/2020 1401   BILIRUBINUR NEGATIVE 01/31/2020 1401   BILIRUBINUR neg 09/27/2015 1130   KETONESUR NEGATIVE 01/31/2020 1401   PROTEINUR NEGATIVE 01/31/2020 1401   UROBILINOGEN 0.2 04/28/2017 1155   NITRITE NEGATIVE 01/31/2020 1401   LEUKOCYTESUR NEGATIVE 01/31/2020 1401    Radiological Exams on Admission - Personally Reviewed: CT HEAD WO CONTRAST  Result Date: 08/12/2020 CLINICAL DATA:  Fall, left supraorbital laceration EXAM: CT HEAD WITHOUT CONTRAST CT MAXILLOFACIAL WITHOUT CONTRAST CT CERVICAL SPINE WITHOUT CONTRAST TECHNIQUE: Multidetector CT imaging of the head, cervical spine, and maxillofacial structures were performed using the standard protocol without intravenous contrast. Multiplanar CT image reconstructions of the cervical spine and maxillofacial structures were also generated. COMPARISON:  MR cervical spine 06/23/2010, CT head  01/31/2020, MR/MRA head 03/20/2015 FINDINGS: CT HEAD FINDINGS Brain: Hyperdense subarachnoid hemorrhage within the sulci of the left frontal lobe (3/16). Subdural collection over the left frontoparietal convexity measuring up to 4 mm in maximal thickness (3/14). Trace subdural hemorrhage layering along the left tentorium up to 3 mm in maximal thickness (6/36). No other concerning sites of hemorrhage. Cortical hyperattenuation along the mesial aspect of the right occipital lobe is present on comparison and may reflect sequela of prior vascular insult. This is in addition to a stable right temporal region of encephalomalacia with ex vacuo dilatation of the right temporal horn. Stable right para falcine lesion measuring 5 x 11 mm, likely a small meningioma with small dural tails. Additional features of moderate diffuse parenchymal volume loss. Patchy areas of white matter hypoattenuation are most compatible with chronic microvascular angiopathy. No midline shift or transtentorial herniation. No CT evidence of large territory infarct. Basal cisterns are patent. Vascular: Atherosclerotic calcification of the carotid siphons and intradural vertebral arteries. No hyperdense vessel. Skull: Left frontal and supraorbital scalp swelling with laceration extending into the periorbital soft tissues, as below. No visible calvarial fracture. No other significant sites of scalp contusion or hematoma. Other: None. CT MAXILLOFACIAL FINDINGS Osseous: The osseous structures appear diffusely demineralized which may limit detection of small or nondisplaced fractures. No fracture of the bony orbits. Nasal bones are intact. There is a ill-defined, slightly expansile lesion measuring approximately 9 x 10 x31 mm (4/19, 10/39) centered in the anteroinferior glabellar process of the frontal bone. Comparison to prior MR and CT imaging reveals a similar appearance on prior studies. No other mid face fractures are seen. The pterygoid plates are  intact. No visible or suspected temporal bone fractures. Temporomandibular joints are normally aligned. The mandible is intact. No fractured or avulsed teeth. Scattered carious lesions and few apical lucencies of the maxillary and mandibular dentition. Orbits: Left supraorbital/periorbital soft tissue swelling, with laceration and few punctate foci of soft tissue gas confined to the preseptal soft tissues. No retro septal gas, stranding or hemorrhage. The globes appear grossly normal and symmetric with prior bilateral lens extractions. Likely benign scleral calcification on the right. Symmetric appearance of the extraocular musculature and optic nerve sheath complexes. Normal caliber of the superior ophthalmic veins. Sinuses: Paranasal sinuses and mastoid air cells are predominantly clear. Middle ear cavities are clear. Debris in the bilateral external auditory canals. Ossicular chains are normally configured. Soft tissues: Left supraorbital/periorbital soft tissue swelling and laceration. No other significant soft tissue findings, gas or foreign body. CT CERVICAL SPINE FINDINGS Alignment: Stabilization collar absent. Mild rightward lateral flexion and cranial rotation. Straightening of the normal cervical lordosis with some focal reversal centered at the C4 level. Stepwise anterolisthesis C2-C4 is favored  to be on a spondylitic basis. Additional anterolisthesis C7 on T1 also likely degenerative and not significantly changed comparison Skull base and vertebrae: The osseous structures appear diffusely demineralized which may limit detection of small or nondisplaced fractures. No acute skull base fracture. No vertebral body fracture or height loss. Progressive spondylitic fusion across the C4-5 levels. Multilevel spondylitic changes throughout the cervical spine as detailed below. Additional moderate to severe arthrosis at the atlantodental and basion dens intervals. No suspicious lytic or blastic lesions in the  cervical spine. Soft tissues and spinal canal: No pre or paravertebral fluid or swelling. No visible canal hematoma. Disc levels: Multilevel intervertebral disc height loss with spondylitic endplate changes., larger disc osteophyte complexes and posterior osseous ridging is pronounced at C3-4, C4-5, C5-6 and C6-7 resulting in mild-to-moderate canal stenosis at these levels. Additional diffuse uncinate spurring and facet hypertrophic changes result in mild-to-moderate multilevel neural foraminal narrowing with more moderate to severe foraminal narrowing on the left at C3-4, C4-5. Upper chest: Biapical pleuroparenchymal scarring. Calcification of the proximal great vessels and cervical carotids, particular at the bifurcations. Normal thyroid. Other: None. IMPRESSION: 1. Hyperdense subarachnoid hemorrhage within the sulci of the left frontal lobe. Subdural hemorrhage over the left frontoparietal convexity measuring up to 4 mm in maximal thickness. Trace subdural hemorrhage layering along the left tentorium up to 3 mm in maximal thickness. 2. Left frontal and supraorbital scalp swelling and laceration extending. No visible calvarial or acute facial bone fracture. 3. Encephalomalacia of the right occipital lobe and right temporal lobe, unchanged from prior. 4. Stable 11 mm right parafalcine lesion, likely a small meningioma. 5. Indeterminate though stable expansile lesion centered in the anterior glabellar process of the frontal bone. 6. No evidence of acute fracture or traumatic listhesis of the cervical spine. 7. Multilevel spondylitic changes throughout the cervical spine, as described. 8. Progressive spondylitic fusion across the C4-5 levels. 9. Scattered carious lesions and few apical lucencies of the maxillary and mandibular dentition. Correlate with dental exam. 10. Cervical and intracranial atherosclerosis. Critical Value/emergent results were called by telephone at the time of interpretation on 08/12/2020 at  1:07 am to provider [facility] , who verbally acknowledged these results. Electronically Signed   By: Lovena Le M.D.   On: 08/12/2020 01:07   CT CERVICAL SPINE WO CONTRAST  Result Date: 08/12/2020 CLINICAL DATA:  Fall, left supraorbital laceration EXAM: CT HEAD WITHOUT CONTRAST CT MAXILLOFACIAL WITHOUT CONTRAST CT CERVICAL SPINE WITHOUT CONTRAST TECHNIQUE: Multidetector CT imaging of the head, cervical spine, and maxillofacial structures were performed using the standard protocol without intravenous contrast. Multiplanar CT image reconstructions of the cervical spine and maxillofacial structures were also generated. COMPARISON:  MR cervical spine 06/23/2010, CT head 01/31/2020, MR/MRA head 03/20/2015 FINDINGS: CT HEAD FINDINGS Brain: Hyperdense subarachnoid hemorrhage within the sulci of the left frontal lobe (3/16). Subdural collection over the left frontoparietal convexity measuring up to 4 mm in maximal thickness (3/14). Trace subdural hemorrhage layering along the left tentorium up to 3 mm in maximal thickness (6/36). No other concerning sites of hemorrhage. Cortical hyperattenuation along the mesial aspect of the right occipital lobe is present on comparison and may reflect sequela of prior vascular insult. This is in addition to a stable right temporal region of encephalomalacia with ex vacuo dilatation of the right temporal horn. Stable right para falcine lesion measuring 5 x 11 mm, likely a small meningioma with small dural tails. Additional features of moderate diffuse parenchymal volume loss. Patchy areas of white matter hypoattenuation are  most compatible with chronic microvascular angiopathy. No midline shift or transtentorial herniation. No CT evidence of large territory infarct. Basal cisterns are patent. Vascular: Atherosclerotic calcification of the carotid siphons and intradural vertebral arteries. No hyperdense vessel. Skull: Left frontal and supraorbital scalp swelling with laceration  extending into the periorbital soft tissues, as below. No visible calvarial fracture. No other significant sites of scalp contusion or hematoma. Other: None. CT MAXILLOFACIAL FINDINGS Osseous: The osseous structures appear diffusely demineralized which may limit detection of small or nondisplaced fractures. No fracture of the bony orbits. Nasal bones are intact. There is a ill-defined, slightly expansile lesion measuring approximately 9 x 10 x31 mm (4/19, 10/39) centered in the anteroinferior glabellar process of the frontal bone. Comparison to prior MR and CT imaging reveals a similar appearance on prior studies. No other mid face fractures are seen. The pterygoid plates are intact. No visible or suspected temporal bone fractures. Temporomandibular joints are normally aligned. The mandible is intact. No fractured or avulsed teeth. Scattered carious lesions and few apical lucencies of the maxillary and mandibular dentition. Orbits: Left supraorbital/periorbital soft tissue swelling, with laceration and few punctate foci of soft tissue gas confined to the preseptal soft tissues. No retro septal gas, stranding or hemorrhage. The globes appear grossly normal and symmetric with prior bilateral lens extractions. Likely benign scleral calcification on the right. Symmetric appearance of the extraocular musculature and optic nerve sheath complexes. Normal caliber of the superior ophthalmic veins. Sinuses: Paranasal sinuses and mastoid air cells are predominantly clear. Middle ear cavities are clear. Debris in the bilateral external auditory canals. Ossicular chains are normally configured. Soft tissues: Left supraorbital/periorbital soft tissue swelling and laceration. No other significant soft tissue findings, gas or foreign body. CT CERVICAL SPINE FINDINGS Alignment: Stabilization collar absent. Mild rightward lateral flexion and cranial rotation. Straightening of the normal cervical lordosis with some focal reversal  centered at the C4 level. Stepwise anterolisthesis C2-C4 is favored to be on a spondylitic basis. Additional anterolisthesis C7 on T1 also likely degenerative and not significantly changed comparison Skull base and vertebrae: The osseous structures appear diffusely demineralized which may limit detection of small or nondisplaced fractures. No acute skull base fracture. No vertebral body fracture or height loss. Progressive spondylitic fusion across the C4-5 levels. Multilevel spondylitic changes throughout the cervical spine as detailed below. Additional moderate to severe arthrosis at the atlantodental and basion dens intervals. No suspicious lytic or blastic lesions in the cervical spine. Soft tissues and spinal canal: No pre or paravertebral fluid or swelling. No visible canal hematoma. Disc levels: Multilevel intervertebral disc height loss with spondylitic endplate changes., larger disc osteophyte complexes and posterior osseous ridging is pronounced at C3-4, C4-5, C5-6 and C6-7 resulting in mild-to-moderate canal stenosis at these levels. Additional diffuse uncinate spurring and facet hypertrophic changes result in mild-to-moderate multilevel neural foraminal narrowing with more moderate to severe foraminal narrowing on the left at C3-4, C4-5. Upper chest: Biapical pleuroparenchymal scarring. Calcification of the proximal great vessels and cervical carotids, particular at the bifurcations. Normal thyroid. Other: None. IMPRESSION: 1. Hyperdense subarachnoid hemorrhage within the sulci of the left frontal lobe. Subdural hemorrhage over the left frontoparietal convexity measuring up to 4 mm in maximal thickness. Trace subdural hemorrhage layering along the left tentorium up to 3 mm in maximal thickness. 2. Left frontal and supraorbital scalp swelling and laceration extending. No visible calvarial or acute facial bone fracture. 3. Encephalomalacia of the right occipital lobe and right temporal lobe, unchanged from  prior. 4.  Stable 11 mm right parafalcine lesion, likely a small meningioma. 5. Indeterminate though stable expansile lesion centered in the anterior glabellar process of the frontal bone. 6. No evidence of acute fracture or traumatic listhesis of the cervical spine. 7. Multilevel spondylitic changes throughout the cervical spine, as described. 8. Progressive spondylitic fusion across the C4-5 levels. 9. Scattered carious lesions and few apical lucencies of the maxillary and mandibular dentition. Correlate with dental exam. 10. Cervical and intracranial atherosclerosis. Critical Value/emergent results were called by telephone at the time of interpretation on 08/12/2020 at 1:07 am to provider [facility] , who verbally acknowledged these results. Electronically Signed   By: Lovena Le M.D.   On: 08/12/2020 01:07   DG Shoulder Left  Result Date: 08/12/2020 CLINICAL DATA:  Arm pain after fall. EXAM: LEFT SHOULDER - 2+ VIEW COMPARISON:  Left humerus 08/11/2020. FINDINGS: Acromioclavicular glenohumeral degenerative change. Subacromial spurring. No acute bony or joint abnormality. No evidence of fracture dislocation. IMPRESSION: Acromioclavicular glenohumeral degenerative change. Subacromial spurring. No acute abnormality identified. Electronically Signed   By: Marcello Moores  Register   On: 08/12/2020 05:58   DG Humerus Left  Result Date: 08/11/2020 CLINICAL DATA:  Left arm pain after fall EXAM: LEFT HUMERUS - 2+ VIEW COMPARISON:  06/16/2010 FINDINGS: Frontal and lateral views of the left humerus are obtained. There are no acute fractures. Moderate osteoarthritis of the left elbow. Soft tissues are unremarkable. IMPRESSION: 1. No acute displaced fracture. 2. Osteoarthritis of the left elbow. Electronically Signed   By: Randa Ngo M.D.   On: 08/11/2020 23:19   CT MAXILLOFACIAL WO CONTRAST  Result Date: 08/12/2020 CLINICAL DATA:  Fall, left supraorbital laceration EXAM: CT HEAD WITHOUT CONTRAST CT  MAXILLOFACIAL WITHOUT CONTRAST CT CERVICAL SPINE WITHOUT CONTRAST TECHNIQUE: Multidetector CT imaging of the head, cervical spine, and maxillofacial structures were performed using the standard protocol without intravenous contrast. Multiplanar CT image reconstructions of the cervical spine and maxillofacial structures were also generated. COMPARISON:  MR cervical spine 06/23/2010, CT head 01/31/2020, MR/MRA head 03/20/2015 FINDINGS: CT HEAD FINDINGS Brain: Hyperdense subarachnoid hemorrhage within the sulci of the left frontal lobe (3/16). Subdural collection over the left frontoparietal convexity measuring up to 4 mm in maximal thickness (3/14). Trace subdural hemorrhage layering along the left tentorium up to 3 mm in maximal thickness (6/36). No other concerning sites of hemorrhage. Cortical hyperattenuation along the mesial aspect of the right occipital lobe is present on comparison and may reflect sequela of prior vascular insult. This is in addition to a stable right temporal region of encephalomalacia with ex vacuo dilatation of the right temporal horn. Stable right para falcine lesion measuring 5 x 11 mm, likely a small meningioma with small dural tails. Additional features of moderate diffuse parenchymal volume loss. Patchy areas of white matter hypoattenuation are most compatible with chronic microvascular angiopathy. No midline shift or transtentorial herniation. No CT evidence of large territory infarct. Basal cisterns are patent. Vascular: Atherosclerotic calcification of the carotid siphons and intradural vertebral arteries. No hyperdense vessel. Skull: Left frontal and supraorbital scalp swelling with laceration extending into the periorbital soft tissues, as below. No visible calvarial fracture. No other significant sites of scalp contusion or hematoma. Other: None. CT MAXILLOFACIAL FINDINGS Osseous: The osseous structures appear diffusely demineralized which may limit detection of small or  nondisplaced fractures. No fracture of the bony orbits. Nasal bones are intact. There is a ill-defined, slightly expansile lesion measuring approximately 9 x 10 x31 mm (4/19, 10/39) centered in the anteroinferior glabellar process  of the frontal bone. Comparison to prior MR and CT imaging reveals a similar appearance on prior studies. No other mid face fractures are seen. The pterygoid plates are intact. No visible or suspected temporal bone fractures. Temporomandibular joints are normally aligned. The mandible is intact. No fractured or avulsed teeth. Scattered carious lesions and few apical lucencies of the maxillary and mandibular dentition. Orbits: Left supraorbital/periorbital soft tissue swelling, with laceration and few punctate foci of soft tissue gas confined to the preseptal soft tissues. No retro septal gas, stranding or hemorrhage. The globes appear grossly normal and symmetric with prior bilateral lens extractions. Likely benign scleral calcification on the right. Symmetric appearance of the extraocular musculature and optic nerve sheath complexes. Normal caliber of the superior ophthalmic veins. Sinuses: Paranasal sinuses and mastoid air cells are predominantly clear. Middle ear cavities are clear. Debris in the bilateral external auditory canals. Ossicular chains are normally configured. Soft tissues: Left supraorbital/periorbital soft tissue swelling and laceration. No other significant soft tissue findings, gas or foreign body. CT CERVICAL SPINE FINDINGS Alignment: Stabilization collar absent. Mild rightward lateral flexion and cranial rotation. Straightening of the normal cervical lordosis with some focal reversal centered at the C4 level. Stepwise anterolisthesis C2-C4 is favored to be on a spondylitic basis. Additional anterolisthesis C7 on T1 also likely degenerative and not significantly changed comparison Skull base and vertebrae: The osseous structures appear diffusely demineralized which may  limit detection of small or nondisplaced fractures. No acute skull base fracture. No vertebral body fracture or height loss. Progressive spondylitic fusion across the C4-5 levels. Multilevel spondylitic changes throughout the cervical spine as detailed below. Additional moderate to severe arthrosis at the atlantodental and basion dens intervals. No suspicious lytic or blastic lesions in the cervical spine. Soft tissues and spinal canal: No pre or paravertebral fluid or swelling. No visible canal hematoma. Disc levels: Multilevel intervertebral disc height loss with spondylitic endplate changes., larger disc osteophyte complexes and posterior osseous ridging is pronounced at C3-4, C4-5, C5-6 and C6-7 resulting in mild-to-moderate canal stenosis at these levels. Additional diffuse uncinate spurring and facet hypertrophic changes result in mild-to-moderate multilevel neural foraminal narrowing with more moderate to severe foraminal narrowing on the left at C3-4, C4-5. Upper chest: Biapical pleuroparenchymal scarring. Calcification of the proximal great vessels and cervical carotids, particular at the bifurcations. Normal thyroid. Other: None. IMPRESSION: 1. Hyperdense subarachnoid hemorrhage within the sulci of the left frontal lobe. Subdural hemorrhage over the left frontoparietal convexity measuring up to 4 mm in maximal thickness. Trace subdural hemorrhage layering along the left tentorium up to 3 mm in maximal thickness. 2. Left frontal and supraorbital scalp swelling and laceration extending. No visible calvarial or acute facial bone fracture. 3. Encephalomalacia of the right occipital lobe and right temporal lobe, unchanged from prior. 4. Stable 11 mm right parafalcine lesion, likely a small meningioma. 5. Indeterminate though stable expansile lesion centered in the anterior glabellar process of the frontal bone. 6. No evidence of acute fracture or traumatic listhesis of the cervical spine. 7. Multilevel  spondylitic changes throughout the cervical spine, as described. 8. Progressive spondylitic fusion across the C4-5 levels. 9. Scattered carious lesions and few apical lucencies of the maxillary and mandibular dentition. Correlate with dental exam. 10. Cervical and intracranial atherosclerosis. Critical Value/emergent results were called by telephone at the time of interpretation on 08/12/2020 at 1:07 am to provider [facility] , who verbally acknowledged these results. Electronically Signed   By: Lovena Le M.D.   On: 08/12/2020 01:07  EKG: Personally reviewed.  Rhythm is slow atrial fibrillation with heart rate of 49 bpm.  No dynamic ST segment changes appreciated.  Assessment/Plan Principal Problem:   Subdural hematoma (HCC)   Patient presenting status post fall with CT imaging of the head revealing a subarachnoid hemorrhage in the left frontal lobe is a left subdural hematoma with no midline shift  Patient denies any focal neurologic deficit or severe headache.    Furthermore physical examination does not reveal any focal deficit  Case discussed between ER provider and Dr. Zada Finders with Neurosurgery who recommended overnight observation on medicine service with neurosurgery presumably to follow as consultation tomorrow.  I have also ordered a repeat noncontrast CT of the head in the morning  Performing serial neurologic checks every 4 hours  Observing patient in progressive unit for close monitoring of blood pressures  Target systolic blood pressure between 563 and 893 systolic with as needed labetalol for blood pressure control  Active Problems:   Hypertensive urgency   Patient initially presented with blood pressures as high as 187/145  In the setting of small subarachnoid hemorrhage and small subdural hematoma, providing patient with as needed intravenous labetalol to achieve target systolic blood pressures of 140-160    Fall at home, initial encounter   Actual  etiology of the patient's fall is unclear  Patient denies loss of consciousness.  Patient denies any symptoms concerning for seizure-like activity.  Patient denies prodrome.  Monitoring patient on telemetry  Obtain orthostatic vital signs  Obtaining echocardiogram  Performing basic work-up for infection with urinalysis and chest x-ray.  PT evaluation in the morning    Subarachnoid hemorrhage following injury ([facility])   Please see assessment and plan above    Mixed hyperlipidemia   Continue home regimen of statin therapy    Chronic kidney disease, stage 3a ([place])    Renal function is actually better than baseline  Continue strict input and output monitoring  Monitoring renal function electrolytes and serum chemistries  Avoiding nephrotoxic agents if possible   Code Status:  Full code Family Communication: deferred   Status is: Observation  The patient remains OBS appropriate and will d/c before 2 midnights.  Dispo: The patient is from: Home              Anticipated d/c is to: Home              Anticipated d/c date is: 2 days              Patient currently is not medically stable to d/c.        Vernelle Emerald MD [facility] Pager [phone]  If 7PM-7AM, please contact night-coverage [email or web address] Use universal [place] password for that web site. If you do not have the password, please call the hospital operator.  08/12/2020, 7:08 AM

## 2020-08-12 NOTE — Progress Notes (Signed)
  Echocardiogram 2D Echocardiogram has been performed.  Michiel Cowboy 08/12/2020, 8:26 AM

## 2020-08-12 NOTE — Progress Notes (Signed)
Subjective: Patient admitted this morning, see detailed H&P by Dr. Cyd Silence 86 year old female with history of right temporal intracranial hemorrhage with hypertensive crisis in 2016, hypertension, macular degeneration, meningioma, paroxysmal atrial fibrillation, stable amyloid angiopathy, hyperlipidemia, CKD stage III presented to [facility] long hospital after a fall at home.  Patient says she was in dining room yesterday evening getting a cookie for her husband when she suddenly felt stuck in a box and fell to the floor.  There was no loss of consciousness.  She was brought to ED. CT head showed subarachnoid hemorrhage in the sulcus of the left frontal lobe as well as subdural hemorrhage over the left frontoparietal convexity measuring 4 cm in maximum thickness.  Neurosurgery was consulted  Vitals:   08/12/20 1143 08/12/20 1245  BP: (!) 141/58 109/88  Pulse: 60 62  Resp: 20 (!) 22  Temp: 98 F (36.7 C)   SpO2: 97% 100%      A/P Subarachnoid hemorrhage-patient seen by neurosurgery, she does not have any neurological symptoms.  Neurosurgery does not recommend any surgical option due to her advanced age.  No further imaging also recommended.  Repeat CT head this morning shows no significant change in subarachnoid hemorrhage.  Fall-likely mechanical fall, will obtain PT evaluation to see if patient needs any rehab therapy.  Hypertension-patient presented with blood pressure 187/145.  Continue with amlodipine, losartan.  Blood pressure is stable at this time.    [facility] [phone]

## 2020-08-12 NOTE — ED Notes (Signed)
Pt's word recall is impaired at baseline due to PMH of stroke

## 2020-08-12 NOTE — ED Notes (Signed)
IV in right AC, redressed with cloth tape and tegaderm. Flushed and saline locked

## 2020-08-13 ENCOUNTER — Observation Stay (HOSPITAL_COMMUNITY): Payer: Medicare Other

## 2020-08-13 DIAGNOSIS — M25522 Pain in left elbow: Secondary | ICD-10-CM

## 2020-08-13 DIAGNOSIS — S01112A Laceration without foreign body of left eyelid and periocular area, initial encounter: Secondary | ICD-10-CM | POA: Diagnosis present

## 2020-08-13 DIAGNOSIS — I160 Hypertensive urgency: Secondary | ICD-10-CM | POA: Diagnosis present

## 2020-08-13 DIAGNOSIS — N1831 Chronic kidney disease, stage 3a: Secondary | ICD-10-CM | POA: Diagnosis present

## 2020-08-13 DIAGNOSIS — I129 Hypertensive chronic kidney disease with stage 1 through stage 4 chronic kidney disease, or unspecified chronic kidney disease: Secondary | ICD-10-CM | POA: Diagnosis present

## 2020-08-13 DIAGNOSIS — M25422 Effusion, left elbow: Secondary | ICD-10-CM | POA: Diagnosis not present

## 2020-08-13 DIAGNOSIS — W19XXXA Unspecified fall, initial encounter: Secondary | ICD-10-CM | POA: Diagnosis not present

## 2020-08-13 DIAGNOSIS — Z86011 Personal history of benign neoplasm of the brain: Secondary | ICD-10-CM | POA: Diagnosis not present

## 2020-08-13 DIAGNOSIS — Z79899 Other long term (current) drug therapy: Secondary | ICD-10-CM | POA: Diagnosis not present

## 2020-08-13 DIAGNOSIS — E782 Mixed hyperlipidemia: Secondary | ICD-10-CM | POA: Diagnosis present

## 2020-08-13 DIAGNOSIS — S065X9A Traumatic subdural hemorrhage with loss of consciousness of unspecified duration, initial encounter: Secondary | ICD-10-CM | POA: Diagnosis present

## 2020-08-13 DIAGNOSIS — W010XXA Fall on same level from slipping, tripping and stumbling without subsequent striking against object, initial encounter: Secondary | ICD-10-CM | POA: Diagnosis present

## 2020-08-13 DIAGNOSIS — Y929 Unspecified place or not applicable: Secondary | ICD-10-CM | POA: Diagnosis not present

## 2020-08-13 DIAGNOSIS — Z20822 Contact with and (suspected) exposure to covid-19: Secondary | ICD-10-CM | POA: Diagnosis present

## 2020-08-13 DIAGNOSIS — I480 Paroxysmal atrial fibrillation: Secondary | ICD-10-CM | POA: Diagnosis present

## 2020-08-13 DIAGNOSIS — S066X0A Traumatic subarachnoid hemorrhage without loss of consciousness, initial encounter: Secondary | ICD-10-CM | POA: Diagnosis present

## 2020-08-13 DIAGNOSIS — S62102A Fracture of unspecified carpal bone, left wrist, initial encounter for closed fracture: Secondary | ICD-10-CM | POA: Diagnosis not present

## 2020-08-13 DIAGNOSIS — I609 Nontraumatic subarachnoid hemorrhage, unspecified: Secondary | ICD-10-CM | POA: Diagnosis not present

## 2020-08-13 DIAGNOSIS — Z8673 Personal history of transient ischemic attack (TIA), and cerebral infarction without residual deficits: Secondary | ICD-10-CM | POA: Diagnosis not present

## 2020-08-13 LAB — URINALYSIS, COMPLETE (UACMP) WITH MICROSCOPIC
Bilirubin Urine: NEGATIVE
Glucose, UA: NEGATIVE mg/dL
Hgb urine dipstick: NEGATIVE
Ketones, ur: NEGATIVE mg/dL
Leukocytes,Ua: NEGATIVE
Nitrite: NEGATIVE
Protein, ur: NEGATIVE mg/dL
Specific Gravity, Urine: 1.014 (ref 1.005–1.030)
pH: 7 (ref 5.0–8.0)

## 2020-08-13 MED ORDER — HYDRALAZINE HCL 25 MG PO TABS
25.0000 mg | ORAL_TABLET | Freq: Four times a day (QID) | ORAL | Status: DC | PRN
  Administered 2020-08-13: 18:00:00 25 mg via ORAL
  Filled 2020-08-13: qty 1

## 2020-08-13 MED ORDER — OXYCODONE HCL 5 MG PO TABS
5.0000 mg | ORAL_TABLET | Freq: Four times a day (QID) | ORAL | Status: DC | PRN
  Administered 2020-08-13: 5 mg via ORAL
  Filled 2020-08-13: qty 1

## 2020-08-13 MED ORDER — OXYCODONE HCL 5 MG PO TABS
5.0000 mg | ORAL_TABLET | ORAL | Status: DC | PRN
  Administered 2020-08-13 – 2020-08-14 (×3): 5 mg via ORAL
  Filled 2020-08-13 (×3): qty 1

## 2020-08-13 MED ORDER — AMLODIPINE BESYLATE 10 MG PO TABS
10.0000 mg | ORAL_TABLET | Freq: Every day | ORAL | Status: DC
  Administered 2020-08-14 – 2020-08-16 (×3): 10 mg via ORAL
  Filled 2020-08-13: qty 2
  Filled 2020-08-13 (×2): qty 1

## 2020-08-13 NOTE — Progress Notes (Signed)
Patient woke disoriented to time, place, situation, & some persons. Daughter called to assist in re-orientation. Patient compliant with care plan and attempting to get rest at this time.

## 2020-08-13 NOTE — Progress Notes (Signed)
Physical Therapy Treatment Patient Details Name: [name] MRN: [id number] DOB: [birth year] Today's Date: 08/13/2020    History of Present Illness Patient  86 year old female with history of right temporal intracranial hemorrhage with hypertensive crisis in 2016, hypertension, macular degeneration, meningioma, paroxysmal atrial fibrillation, stable amyloid angiopathy, hyperlipidemia, CKD stage III presented to [facility] long hospital after a fall at home.  Patient says she was in dining room yesterday evening getting a cookie for her husband when she suddenly felt her foot got stuck and fell to the floor.  There was no loss of consciousness.  She was brought to ED 08/11/2020.  CT head showed subarachnoid hemorrhage in the sulcus of the left frontal lobe as well as subdural hemorrhage over the left frontoparietal convexity measuring 4 cm in maximum thickness. Pt also with LT shoulder to elbow pain with X-ray negative for fracture or acute abnomality. Left elbow xray: Changes consistent with prior subacute to chronic fracture in the  radial head.    PT Comments    Pt appears very anxious with mobility and hesitant for OOB.  RN came to room to encourage pt and assist with transfer to recliner safely.  Pt's L UE remains in sling however pt reports extreme pain throughout session.  Pt would benefit from SNF upon d/c.   Follow Up Recommendations  SNF     Equipment Recommendations  None recommended by PT    Recommendations for Other Services       Precautions / Restrictions Precautions Precautions: Fall Required Braces or Orthoses: Sling Restrictions Weight Bearing Restrictions: No (Treating LUE as NWB due to pain and sling order) Other Position/Activity Restrictions: L UE: no order however keeping NWB due to sling and elbow pain    Mobility  Bed Mobility Overal bed mobility: Needs Assistance Bed Mobility: Supine to Sit Rolling: Mod assist   Supine to sit: Max assist Sit to supine:  Mod assist   General bed mobility comments: assist for trunk and scooting to EOB, pt provided with cues however struggling due to elbow pain  Transfers Overall transfer level: Needs assistance Equipment used: 1 person hand held assist Transfers: Sit to/from Stand;Stand Pivot Transfers Sit to Stand: Max assist;+2 physical assistance Stand pivot transfers: Mod assist;+2 physical assistance       General transfer comment: assist to rise and steady as well as control descent, assist for steadying  Ambulation/Gait             General Gait Details: pt declined attempting due to elbow pain   Stairs             Wheelchair Mobility    Modified Rankin (Stroke Patients Only)       Balance Overall balance assessment: Needs assistance Sitting-balance support: Feet supported Sitting balance-Leahy Scale: Poor Sitting balance - Comments: Pt leans posteriorly and to RT. Needs Min Assist at times for steadying.   Standing balance support: Single extremity supported Standing balance-Leahy Scale: Poor Standing balance comment: Needs Max As to maintain stand for <5 seconds with RT HHA and bracing LEs against bed.                            Cognition Arousal/Alertness: Awake/alert Behavior During Therapy: Anxious Overall Cognitive Status: Impaired/Different from baseline Area of Impairment: Orientation;Following commands                 Orientation Level: Place;Situation     Following Commands: Follows multi-step commands inconsistently  General Comments: appears very anxious to mobilize however improved with breathing and relaxation technique      Exercises Shoulder Exercises Wrist Flexion: AROM;5 reps;Left Wrist Extension: AROM;5 reps;Left Composite Extension: Left;Other (comment) (Attemtped but too painful and terminated)    General Comments        Pertinent Vitals/Pain Pain Assessment: Faces Pain Score: 10-Worst pain ever Faces Pain  Scale: Hurts worst Pain Location: left elbow Pain Descriptors / Indicators: Grimacing;Sore;Sharp Pain Intervention(s): Monitored during session;Repositioned (RN aware)    Home Living Family/patient expects to be discharged to:: Skilled nursing facility Living Arrangements: Spouse/significant other;Children Available Help at Discharge: Family;Personal care attendant Type of Home: House Home Access: Stairs to enter Entrance Stairs-Rails: Left Home Layout: One level Home Equipment: Grab bars - tub/shower;Shower seat - built in;Grab bars - toilet;Walker - 2 wheels;Wheelchair - Education administrator (comment);Hand held shower head;Walker - 4 wheels (Lift recliner, adjustable bed (can get rail per dtr)) Additional Comments: Pt and and spouse live in a basement apartment in her daughter and son-in-law's house. Everythin is very handicap accessible due to her husband also in a WC. They enter into their daughter's house and then do have to go down 16 steps to their apartment, but there is a chair lift. Pt has alwyas used the steps becuase she likes the exercise. They have a caregiver that comes every morning, but the dtr stated they are adding more help and increase time to help them both if needed. They have life alert and multiple safety features. Pt used RW at baseline in the apratment but really liked to stay active and on her feet. Still does her banking, etc. Very active and sharp for her age.    Prior Function Level of Independence: Independent with assistive device(s)      Comments: Pt ambualtes with a RW at home, and reports being independent with all ADLs.   PT Goals (current goals can now be found in the care plan section) Acute Rehab PT Goals Patient Stated Goal: Pain management to LUE Progress towards PT goals: Progressing toward goals    Frequency    Min 2X/week      PT Plan Current plan remains appropriate    Co-evaluation              AM-PAC PT "6 Clicks" Mobility   Outcome  Measure  Help needed turning from your back to your side while in a flat bed without using bedrails?: A Lot Help needed moving from lying on your back to sitting on the side of a flat bed without using bedrails?: A Lot Help needed moving to and from a bed to a chair (including a wheelchair)?: A Lot Help needed standing up from a chair using your arms (e.g., wheelchair or bedside chair)?: A Lot Help needed to walk in hospital room?: A Lot Help needed climbing 3-5 steps with a railing? : A Lot 6 Click Score: 12    End of Session Equipment Utilized During Treatment: Gait belt Activity Tolerance: Other (comment) (appears anxious with mobility) Patient left: in chair;with call bell/phone within reach Nurse Communication: Mobility status (Rn assisted with transfer and aware pt not on chair alarm) PT Visit Diagnosis: Unsteadiness on feet (R26.81);Other abnormalities of gait and mobility (R26.89)     Time: [id number] PT Time Calculation (min) (ACUTE ONLY): 18 min  Charges:  $Therapeutic Activity: 8-22 mins                     Jannette Spanner PT, DPT Acute  Rehabilitation Services Pager: [phone] Office: [phone]  Trena Platt 08/13/2020, 3:22 PM

## 2020-08-13 NOTE — Progress Notes (Signed)
Orthopedic Tech Progress Note Patient Details:  Lourene Hoston [facility] 1918/03/31 [id number]  Ortho Devices Type of Ortho Device: Sling immobilizer Ortho Device/Splint Location: left Ortho Device/Splint Interventions: Application   Post Interventions Patient Tolerated: Well Instructions Provided: Care of device,Adjustment of device   [place] Pink 08/13/2020, 9:05 AM

## 2020-08-13 NOTE — Progress Notes (Signed)
[facility]  PROGRESS NOTE  Wilfred Curtismelia P Lowrey ZOX:[id number] DOB: 1918/07/14 DOA: 08/11/2020 PCP: Shelva MajesticHunter, Stephen O, MD   Brief HPI:   86 year old female with history of right temporal intracranial hemorrhage with hypertensive crisis in 2016, hypertension, macular degeneration, meningioma, paroxysmal atrial fibrillation, stable amyloid angiopathy, hyperlipidemia, CKD stage III presented to [facility] after a fall at home.  Patient says she was in dining room yesterday evening getting a cookie for her husband when she suddenly felt stuck in a box and fell to the floor.  There was no loss of consciousness.  She was brought to ED. CT head showed subarachnoid hemorrhage in the sulcus of the left frontal lobe as well as subdural hemorrhage over the left frontoparietal convexity measuring 4 cm in maximum thickness.  Neurosurgery was consulted.    Subjective   Patient seen and examined, complains of left elbow pain.  Patient said that she was having shoulder pain when she fell at home.   Assessment/Plan:     1. Subarachnoid hemorrhage-seen by neurosurgery, no neurological symptoms.  No surgical option recommended due to advanced age.  No further imaging also recommended. 2. Fall-likely mechanical fall, PT has seen the patient and recommend skilled nursing facility. 3. Hypertension-blood pressure is elevated goal is to keep SBP one 40-1 sixty.  Continue amlodipine 5 mg, losartan 100 mg daily.  Will start hydralazine 25 mg p.o. every 6 hours as needed for BP more than 160/100. 4. Left elbow pain-x-ray obtained today shows changes consistent with prior subacute to chronic fracture in the radial head.  Small joint effusion noted.  Called and discussed with orthopedics Dr. Ophelia CharterYates, he recommends putting on sling and pain control.  Patient can follow with Dr. Ophelia CharterYates as outpatient.     COVID-19 Labs  No results for input(s): DDIMER, FERRITIN, LDH, CRP in the last 72 hours.  Lab Results   Component Value Date   SARSCOV2NAA NEGATIVE 08/12/2020     Scheduled medications:   . [START ON 08/14/2020] amLODipine  10 mg Oral Daily  . atorvastatin  40 mg Oral QHS  . losartan  100 mg Oral QHS         CBG: Recent Labs  Lab 08/12/20 1558  GLUCAP 112*    SpO2: 92 %    CBC: Recent Labs  Lab 08/12/20 0209 08/12/20 0628  WBC 10.0 7.9  NEUTROABS  --  5.7  HGB 12.2 9.6*  HCT 37.3 29.9*  MCV 89.7 91.2  PLT 170 174    Basic Metabolic Panel: Recent Labs  Lab 08/12/20 0209 08/12/20 0628  NA 141 143  K 3.9 3.5  CL 106 106  CO2 25 25  GLUCOSE 134* 119*  BUN 24* 20  CREATININE 0.86 0.70  CALCIUM 8.6* 8.1*  MG  --  1.9     Liver Function Tests: Recent Labs  Lab 08/12/20 0628  AST 23  ALT 17  ALKPHOS 53  BILITOT 0.8  PROT 6.0*  ALBUMIN 3.3*     Antibiotics: Anti-infectives (From admission, onward)   None       DVT prophylaxis: SCDs  Code Status: Full code  Family Communication: No family at bedside   Consultants:  Neurosurgery  Procedures:      Objective   Vitals:   08/12/20 1658 08/12/20 2011 08/13/20 0538 08/13/20 1335  BP: (!) 175/61 (!) 157/85 (!) 161/65 (!) 177/71  Pulse: 60 62 (!) 54 61  Resp: (!) 23 20 14 18   Temp: 98.4 F (36.9 C)  99.1 F (37.3 C) 98.5 F (36.9 C) 99.6 F (37.6 C)  TempSrc: Oral Oral Oral Oral  SpO2:  97% 98% 92%  Weight:  59.6 kg    Height:  5' (1.524 m)      Intake/Output Summary (Last 24 hours) at 08/13/2020 1735 Last data filed at 08/13/2020 1014 Gross per 24 hour  Intake 120 ml  Output 350 ml  Net -230 ml    12/19 1901 - 12/21 0700 In: -  Out: 350 [Urine:350]  Filed Weights   08/12/20 2011  Weight: 59.6 kg    Physical Examination:   General-appears in no acute distress Heart-S1-S2, regular, no murmur auscultated Lungs-clear to auscultation bilaterally, no wheezing or crackles auscultated Abdomen-soft, nontender, no organomegaly Extremities-tenderness elicited at  left elbow, no erythema noted no effusion noted Neuro-alert, oriented x3, no focal deficit noted  Status is: Inpatient  Dispo: The patient is from: Home              Anticipated d/c is to: Skilled nursing facility              Anticipated d/c date is: 08/18/2020              Patient currently not medically stable for discharge  Barrier to discharge-ongoing pain control, physical therapy, pending bed availability at skilled nursing facility      Data Reviewed:   Recent Results (from the past 240 hour(s))  Resp Panel by RT-PCR (Flu A&B, Covid) Nasopharyngeal Swab     Status: None   Collection Time: 08/12/20  6:28 AM   Specimen: Nasopharyngeal Swab; Nasopharyngeal(NP) swabs in vial transport medium  Result Value Ref Range Status   SARS Coronavirus 2 by RT PCR NEGATIVE NEGATIVE Final    Comment: (NOTE) SARS-CoV-2 target nucleic acids are NOT DETECTED.  The SARS-CoV-2 RNA is generally detectable in upper respiratory specimens during the acute phase of infection. The lowest concentration of SARS-CoV-2 viral copies this assay can detect is 138 copies/mL. A negative result does not preclude SARS-Cov-2 infection and should not be used as the sole basis for treatment or other patient management decisions. A negative result may occur with  improper specimen collection/handling, submission of specimen other than nasopharyngeal swab, presence of viral mutation(s) within the areas targeted by this assay, and inadequate number of viral copies(<138 copies/mL). A negative result must be combined with clinical observations, patient history, and epidemiological information. The expected result is Negative.  Fact Sheet for Patients:  EntrepreneurPulse.com.au  Fact Sheet for Healthcare Providers:  IncredibleEmployment.be  This test is no t yet approved or cleared by the [place] FDA and  has been authorized for detection and/or diagnosis of SARS-CoV-2  by FDA under an Emergency Use Authorization (EUA). This EUA will remain  in effect (meaning this test can be used) for the duration of the COVID-19 declaration under Section 564(b)(1) of the Act, 21 U.S.C.section 360bbb-3(b)(1), unless the authorization is terminated  or revoked sooner.       Influenza A by PCR NEGATIVE NEGATIVE Final   Influenza B by PCR NEGATIVE NEGATIVE Final    Comment: (NOTE) The Xpert Xpress SARS-CoV-2/FLU/RSV plus assay is intended as an aid in the diagnosis of influenza from Nasopharyngeal swab specimens and should not be used as a sole basis for treatment. Nasal washings and aspirates are unacceptable for Xpert Xpress SARS-CoV-2/FLU/RSV testing.  Fact Sheet for Patients: EntrepreneurPulse.com.au  Fact Sheet for Healthcare Providers: IncredibleEmployment.be  This test is not yet approved or cleared by the  [place] Architectural technologist and has been authorized for detection and/or diagnosis of SARS-CoV-2 by FDA under an Print production planner (EUA). This EUA will remain in effect (meaning this test can be used) for the duration of the COVID-19 declaration under Section 564(b)(1) of the Act, 21 U.S.C. section 360bbb-3(b)(1), unless the authorization is terminated or revoked.  Performed at [facility], [place] [address], [place], [place] [address]     No results for input(s): LIPASE, AMYLASE in the last 168 hours. No results for input(s): AMMONIA in the last 168 hours.  Cardiac Enzymes: No results for input(s): CKTOTAL, CKMB, CKMBINDEX, TROPONINI in the last 168 hours. BNP (last 3 results) No results for input(s): BNP in the last 8760 hours.  ProBNP (last 3 results) No results for input(s): PROBNP in the last 8760 hours.  Studies:  DG Chest 1 View  Result Date: 08/12/2020 CLINICAL DATA:  Pneumonia EXAM: CHEST  1 VIEW COMPARISON:  03/28/2015 FINDINGS: Borderline heart size, stable. Mitral annular  calcification. Artifact from EKG leads. There is no edema, convincing consolidation, effusion, or pneumothorax. IMPRESSION: No convincing pneumonia. Electronically Signed   By: Monte Fantasia M.D.   On: 08/12/2020 07:41   DG Elbow 2 Views Left  Result Date: 08/13/2020 CLINICAL DATA:  Elbow pain EXAM: LEFT ELBOW - 2 VIEW COMPARISON:  08/11/2020 FINDINGS: There are changes consistent with prior fracture of the radial head. The fracture fragment is well corticated and likely of a subacute to chronic nature. Small joint effusion is noted new from the prior exam. No other focal abnormality is noted. IMPRESSION: Changes consistent with prior subacute to chronic fracture in the radial head. Small joint effusion is noted. Electronically Signed   By: Inez Catalina M.D.   On: 08/13/2020 13:24   CT HEAD WO CONTRAST  Result Date: 08/12/2020 CLINICAL DATA:  Subdural hematoma. EXAM: CT HEAD WITHOUT CONTRAST TECHNIQUE: Contiguous axial images were obtained from the base of the skull through the vertex without intravenous contrast. COMPARISON:  Head CT performed earlier today 08/12/2020. FINDINGS: Brain: Moderate generalized parenchymal atrophy. Stable small volume acute subarachnoid hemorrhage scattered along the left cerebral hemisphere. Stable left hemispheric subdural hematoma greatest along the left frontoparietal and temporal lobes measuring up to 4 mm in greatest thickness. Minimal mass effect upon the underlying left cerebral hemisphere. No midline shift. Stable thin acute subdural hemorrhage along the left tentorium, measuring 3 mm in greatest thickness. Redemonstrated 12 mm dural-based mass along the right aspect of the anterior falx compatible with meningioma. Redemonstrated chronic right temporal lobe encephalomalacia with ex vacuo dilatation of the right temporal horn. Unchanged subtle chronic gyriform hyperdensity within the right occipital lobe which may reflect cortical laminar necrosis. Ill-defined  hypoattenuation within the cerebral white matter is nonspecific, but compatible with chronic small vessel ischemic disease. No acute demarcated cortical infarction Vascular: No hyperdense vessel.  Atherosclerotic calcifications. Skull: No calvarial fracture. Nonspecific 2.1 cm lucent expansile lesion within the glabellar process of the frontal bone. Sinuses/Orbits: Visualized orbits show no acute finding. Mild ethmoid sinus mucosal thickening. IMPRESSION: Stable examination as compared to the head CT performed earlier today at 12:01 a.m. Small-volume acute subarachnoid hemorrhage along the left frontal lobe. Acute left subdural hemorrhage greatest along the left frontoparietal and temporal lobes, as well as left tentorium (measuring up to 4 mm). No midline shift. 12 mm right anterior parafalcine meningioma. Chronic right temporal lobe encephalomalacia. Generalized atrophy and chronic small vessel ischemic disease. Electronically Signed   By: Kellie Simmering DO  On: 08/12/2020 07:49   CT HEAD WO CONTRAST  Result Date: 08/12/2020 CLINICAL DATA:  Fall, left supraorbital laceration EXAM: CT HEAD WITHOUT CONTRAST CT MAXILLOFACIAL WITHOUT CONTRAST CT CERVICAL SPINE WITHOUT CONTRAST TECHNIQUE: Multidetector CT imaging of the head, cervical spine, and maxillofacial structures were performed using the standard protocol without intravenous contrast. Multiplanar CT image reconstructions of the cervical spine and maxillofacial structures were also generated. COMPARISON:  MR cervical spine 06/23/2010, CT head 01/31/2020, MR/MRA head 03/20/2015 FINDINGS: CT HEAD FINDINGS Brain: Hyperdense subarachnoid hemorrhage within the sulci of the left frontal lobe (3/16). Subdural collection over the left frontoparietal convexity measuring up to 4 mm in maximal thickness (3/14). Trace subdural hemorrhage layering along the left tentorium up to 3 mm in maximal thickness (6/36). No other concerning sites of hemorrhage. Cortical  hyperattenuation along the mesial aspect of the right occipital lobe is present on comparison and may reflect sequela of prior vascular insult. This is in addition to a stable right temporal region of encephalomalacia with ex vacuo dilatation of the right temporal horn. Stable right para falcine lesion measuring 5 x 11 mm, likely a small meningioma with small dural tails. Additional features of moderate diffuse parenchymal volume loss. Patchy areas of white matter hypoattenuation are most compatible with chronic microvascular angiopathy. No midline shift or transtentorial herniation. No CT evidence of large territory infarct. Basal cisterns are patent. Vascular: Atherosclerotic calcification of the carotid siphons and intradural vertebral arteries. No hyperdense vessel. Skull: Left frontal and supraorbital scalp swelling with laceration extending into the periorbital soft tissues, as below. No visible calvarial fracture. No other significant sites of scalp contusion or hematoma. Other: None. CT MAXILLOFACIAL FINDINGS Osseous: The osseous structures appear diffusely demineralized which may limit detection of small or nondisplaced fractures. No fracture of the bony orbits. Nasal bones are intact. There is a ill-defined, slightly expansile lesion measuring approximately 9 x 10 x31 mm (4/19, 10/39) centered in the anteroinferior glabellar process of the frontal bone. Comparison to prior MR and CT imaging reveals a similar appearance on prior studies. No other mid face fractures are seen. The pterygoid plates are intact. No visible or suspected temporal bone fractures. Temporomandibular joints are normally aligned. The mandible is intact. No fractured or avulsed teeth. Scattered carious lesions and few apical lucencies of the maxillary and mandibular dentition. Orbits: Left supraorbital/periorbital soft tissue swelling, with laceration and few punctate foci of soft tissue gas confined to the preseptal soft tissues. No  retro septal gas, stranding or hemorrhage. The globes appear grossly normal and symmetric with prior bilateral lens extractions. Likely benign scleral calcification on the right. Symmetric appearance of the extraocular musculature and optic nerve sheath complexes. Normal caliber of the superior ophthalmic veins. Sinuses: Paranasal sinuses and mastoid air cells are predominantly clear. Middle ear cavities are clear. Debris in the bilateral external auditory canals. Ossicular chains are normally configured. Soft tissues: Left supraorbital/periorbital soft tissue swelling and laceration. No other significant soft tissue findings, gas or foreign body. CT CERVICAL SPINE FINDINGS Alignment: Stabilization collar absent. Mild rightward lateral flexion and cranial rotation. Straightening of the normal cervical lordosis with some focal reversal centered at the C4 level. Stepwise anterolisthesis C2-C4 is favored to be on a spondylitic basis. Additional anterolisthesis C7 on T1 also likely degenerative and not significantly changed comparison Skull base and vertebrae: The osseous structures appear diffusely demineralized which may limit detection of small or nondisplaced fractures. No acute skull base fracture. No vertebral body fracture or height loss. Progressive spondylitic fusion across  the C4-5 levels. Multilevel spondylitic changes throughout the cervical spine as detailed below. Additional moderate to severe arthrosis at the atlantodental and basion dens intervals. No suspicious lytic or blastic lesions in the cervical spine. Soft tissues and spinal canal: No pre or paravertebral fluid or swelling. No visible canal hematoma. Disc levels: Multilevel intervertebral disc height loss with spondylitic endplate changes., larger disc osteophyte complexes and posterior osseous ridging is pronounced at C3-4, C4-5, C5-6 and C6-7 resulting in mild-to-moderate canal stenosis at these levels. Additional diffuse uncinate spurring and  facet hypertrophic changes result in mild-to-moderate multilevel neural foraminal narrowing with more moderate to severe foraminal narrowing on the left at C3-4, C4-5. Upper chest: Biapical pleuroparenchymal scarring. Calcification of the proximal great vessels and cervical carotids, particular at the bifurcations. Normal thyroid. Other: None. IMPRESSION: 1. Hyperdense subarachnoid hemorrhage within the sulci of the left frontal lobe. Subdural hemorrhage over the left frontoparietal convexity measuring up to 4 mm in maximal thickness. Trace subdural hemorrhage layering along the left tentorium up to 3 mm in maximal thickness. 2. Left frontal and supraorbital scalp swelling and laceration extending. No visible calvarial or acute facial bone fracture. 3. Encephalomalacia of the right occipital lobe and right temporal lobe, unchanged from prior. 4. Stable 11 mm right parafalcine lesion, likely a small meningioma. 5. Indeterminate though stable expansile lesion centered in the anterior glabellar process of the frontal bone. 6. No evidence of acute fracture or traumatic listhesis of the cervical spine. 7. Multilevel spondylitic changes throughout the cervical spine, as described. 8. Progressive spondylitic fusion across the C4-5 levels. 9. Scattered carious lesions and few apical lucencies of the maxillary and mandibular dentition. Correlate with dental exam. 10. Cervical and intracranial atherosclerosis. Critical Value/emergent results were called by telephone at the time of interpretation on 08/12/2020 at 1:07 am to provider [facility] , who verbally acknowledged these results. Electronically Signed   By: Lovena Le M.D.   On: 08/12/2020 01:07   CT CERVICAL SPINE WO CONTRAST  Result Date: 08/12/2020 CLINICAL DATA:  Fall, left supraorbital laceration EXAM: CT HEAD WITHOUT CONTRAST CT MAXILLOFACIAL WITHOUT CONTRAST CT CERVICAL SPINE WITHOUT CONTRAST TECHNIQUE: Multidetector CT imaging of the head, cervical  spine, and maxillofacial structures were performed using the standard protocol without intravenous contrast. Multiplanar CT image reconstructions of the cervical spine and maxillofacial structures were also generated. COMPARISON:  MR cervical spine 06/23/2010, CT head 01/31/2020, MR/MRA head 03/20/2015 FINDINGS: CT HEAD FINDINGS Brain: Hyperdense subarachnoid hemorrhage within the sulci of the left frontal lobe (3/16). Subdural collection over the left frontoparietal convexity measuring up to 4 mm in maximal thickness (3/14). Trace subdural hemorrhage layering along the left tentorium up to 3 mm in maximal thickness (6/36). No other concerning sites of hemorrhage. Cortical hyperattenuation along the mesial aspect of the right occipital lobe is present on comparison and may reflect sequela of prior vascular insult. This is in addition to a stable right temporal region of encephalomalacia with ex vacuo dilatation of the right temporal horn. Stable right para falcine lesion measuring 5 x 11 mm, likely a small meningioma with small dural tails. Additional features of moderate diffuse parenchymal volume loss. Patchy areas of white matter hypoattenuation are most compatible with chronic microvascular angiopathy. No midline shift or transtentorial herniation. No CT evidence of large territory infarct. Basal cisterns are patent. Vascular: Atherosclerotic calcification of the carotid siphons and intradural vertebral arteries. No hyperdense vessel. Skull: Left frontal and supraorbital scalp swelling with laceration extending into the periorbital soft tissues, as below.  No visible calvarial fracture. No other significant sites of scalp contusion or hematoma. Other: None. CT MAXILLOFACIAL FINDINGS Osseous: The osseous structures appear diffusely demineralized which may limit detection of small or nondisplaced fractures. No fracture of the bony orbits. Nasal bones are intact. There is a ill-defined, slightly expansile lesion  measuring approximately 9 x 10 x31 mm (4/19, 10/39) centered in the anteroinferior glabellar process of the frontal bone. Comparison to prior MR and CT imaging reveals a similar appearance on prior studies. No other mid face fractures are seen. The pterygoid plates are intact. No visible or suspected temporal bone fractures. Temporomandibular joints are normally aligned. The mandible is intact. No fractured or avulsed teeth. Scattered carious lesions and few apical lucencies of the maxillary and mandibular dentition. Orbits: Left supraorbital/periorbital soft tissue swelling, with laceration and few punctate foci of soft tissue gas confined to the preseptal soft tissues. No retro septal gas, stranding or hemorrhage. The globes appear grossly normal and symmetric with prior bilateral lens extractions. Likely benign scleral calcification on the right. Symmetric appearance of the extraocular musculature and optic nerve sheath complexes. Normal caliber of the superior ophthalmic veins. Sinuses: Paranasal sinuses and mastoid air cells are predominantly clear. Middle ear cavities are clear. Debris in the bilateral external auditory canals. Ossicular chains are normally configured. Soft tissues: Left supraorbital/periorbital soft tissue swelling and laceration. No other significant soft tissue findings, gas or foreign body. CT CERVICAL SPINE FINDINGS Alignment: Stabilization collar absent. Mild rightward lateral flexion and cranial rotation. Straightening of the normal cervical lordosis with some focal reversal centered at the C4 level. Stepwise anterolisthesis C2-C4 is favored to be on a spondylitic basis. Additional anterolisthesis C7 on T1 also likely degenerative and not significantly changed comparison Skull base and vertebrae: The osseous structures appear diffusely demineralized which may limit detection of small or nondisplaced fractures. No acute skull base fracture. No vertebral body fracture or height loss.  Progressive spondylitic fusion across the C4-5 levels. Multilevel spondylitic changes throughout the cervical spine as detailed below. Additional moderate to severe arthrosis at the atlantodental and basion dens intervals. No suspicious lytic or blastic lesions in the cervical spine. Soft tissues and spinal canal: No pre or paravertebral fluid or swelling. No visible canal hematoma. Disc levels: Multilevel intervertebral disc height loss with spondylitic endplate changes., larger disc osteophyte complexes and posterior osseous ridging is pronounced at C3-4, C4-5, C5-6 and C6-7 resulting in mild-to-moderate canal stenosis at these levels. Additional diffuse uncinate spurring and facet hypertrophic changes result in mild-to-moderate multilevel neural foraminal narrowing with more moderate to severe foraminal narrowing on the left at C3-4, C4-5. Upper chest: Biapical pleuroparenchymal scarring. Calcification of the proximal great vessels and cervical carotids, particular at the bifurcations. Normal thyroid. Other: None. IMPRESSION: 1. Hyperdense subarachnoid hemorrhage within the sulci of the left frontal lobe. Subdural hemorrhage over the left frontoparietal convexity measuring up to 4 mm in maximal thickness. Trace subdural hemorrhage layering along the left tentorium up to 3 mm in maximal thickness. 2. Left frontal and supraorbital scalp swelling and laceration extending. No visible calvarial or acute facial bone fracture. 3. Encephalomalacia of the right occipital lobe and right temporal lobe, unchanged from prior. 4. Stable 11 mm right parafalcine lesion, likely a small meningioma. 5. Indeterminate though stable expansile lesion centered in the anterior glabellar process of the frontal bone. 6. No evidence of acute fracture or traumatic listhesis of the cervical spine. 7. Multilevel spondylitic changes throughout the cervical spine, as described. 8. Progressive spondylitic fusion across the  C4-5 levels. 9.  Scattered carious lesions and few apical lucencies of the maxillary and mandibular dentition. Correlate with dental exam. 10. Cervical and intracranial atherosclerosis. Critical Value/emergent results were called by telephone at the time of interpretation on 08/12/2020 at 1:07 am to provider [facility] , who verbally acknowledged these results. Electronically Signed   By: Kreg Shropshire M.D.   On: 08/12/2020 01:07   DG Shoulder Left  Result Date: 08/12/2020 CLINICAL DATA:  Arm pain after fall. EXAM: LEFT SHOULDER - 2+ VIEW COMPARISON:  Left humerus 08/11/2020. FINDINGS: Acromioclavicular glenohumeral degenerative change. Subacromial spurring. No acute bony or joint abnormality. No evidence of fracture dislocation. IMPRESSION: Acromioclavicular glenohumeral degenerative change. Subacromial spurring. No acute abnormality identified. Electronically Signed   By: [name]  Register   On: 08/12/2020 05:58   DG Humerus Left  Result Date: 08/11/2020 CLINICAL DATA:  Left arm pain after fall EXAM: LEFT HUMERUS - 2+ VIEW COMPARISON:  06/16/2010 FINDINGS: Frontal and lateral views of the left humerus are obtained. There are no acute fractures. Moderate osteoarthritis of the left elbow. Soft tissues are unremarkable. IMPRESSION: 1. No acute displaced fracture. 2. Osteoarthritis of the left elbow. Electronically Signed   By: Sharlet Salina M.D.   On: 08/11/2020 23:19   ECHOCARDIOGRAM COMPLETE  Result Date: 08/12/2020    ECHOCARDIOGRAM REPORT   Patient Name:   [name] Mathieu Date of Exam: 08/12/2020 Medical Rec #:  [id number]       Height:       60.0 in Accession #:    [id number]      Weight:       128.0 lb Date of Birth:  1918/08/24       BSA:          1.544 m Patient Age:    86 years       BP:           120/57 mmHg Patient Gender: F               HR:           44 bpm. Exam Location:  Inpatient Procedure: 2D Echo, Cardiac Doppler and Color Doppler Indications:    Syncope 780.2 / R55  History:        Patient has  prior history of Echocardiogram examinations, most                 recent 03/26/2015. Stroke, Arrythmias:Atrial Fibrillation; Risk                 Factors:Hypertension and Dyslipidemia.  Sonographer:    Data processing manager Referring Phys: [id number] Deno Lunger [place] IMPRESSIONS  1. Left ventricular ejection fraction, by estimation, is 55 to 60%. The left ventricle has normal function. The left ventricle has no regional wall motion abnormalities. Left ventricular diastolic function could not be evaluated.  2. Right ventricular systolic function is normal. The right ventricular size is normal. There is normal pulmonary artery systolic pressure. The estimated right ventricular systolic pressure is 29.4 mmHg.  3. Left atrial size was severely dilated.  4. Right atrial size was moderately dilated.  5. The mitral valve is abnormal. Mild mitral valve regurgitation.  6. The aortic valve is tricuspid. Aortic valve regurgitation is not visualized. Mild aortic valve sclerosis is present, with no evidence of aortic valve stenosis.  7. The inferior vena cava is normal in size with greater than 50% respiratory variability, suggesting right atrial pressure of 3 mmHg.  8. Cannot exclude small PFO. FINDINGS  Left Ventricle: Left ventricular ejection fraction, by estimation, is 55 to 60%. The left ventricle has normal function. The left ventricle has no regional wall motion abnormalities. The left ventricular internal cavity size was normal in size. There is  no left ventricular hypertrophy. Left ventricular diastolic function could not be evaluated due to atrial fibrillation. Left ventricular diastolic function could not be evaluated. Right Ventricle: The right ventricular size is normal. No increase in right ventricular wall thickness. Right ventricular systolic function is normal. There is normal pulmonary artery systolic pressure. The tricuspid regurgitant velocity is 2.57 m/s, and  with an assumed right atrial pressure of 3  mmHg, the estimated right ventricular systolic pressure is [id number]. Left Atrium: Left atrial size was severely dilated. Right Atrium: Right atrial size was moderately dilated. Pericardium: There is no evidence of pericardial effusion. Mitral Valve: The mitral valve is abnormal. There is mild thickening of the mitral valve leaflet(s). Mild to moderate mitral annular calcification. Mild mitral valve regurgitation. Tricuspid Valve: The tricuspid valve is grossly normal. Tricuspid valve regurgitation is mild. Aortic Valve: The aortic valve is tricuspid. Aortic valve regurgitation is not visualized. Mild aortic valve sclerosis is present, with no evidence of aortic valve stenosis. Pulmonic Valve: The pulmonic valve was normal in structure. Pulmonic valve regurgitation is not visualized. Aorta: The aortic root and ascending aorta are structurally normal, with no evidence of dilitation. Venous: The inferior vena cava is normal in size with greater than 50% respiratory variability, suggesting right atrial pressure of 3 mmHg. IAS/Shunts: Cannot exclude small PFO.  LEFT VENTRICLE PLAX 2D LVIDd:         3.50 cm LVIDs:         2.50 cm LV PW:         0.90 cm LV IVS:        0.90 cm LVOT diam:     1.80 cm LV SV:         51 LV SV Index:   33 LVOT Area:     2.54 cm  LV Volumes (MOD) LV vol d, MOD A2C: 68.5 ml LV vol d, MOD A4C: 65.5 ml LV vol s, MOD A2C: 26.9 ml LV vol s, MOD A4C: 27.7 ml LV SV MOD A2C:     41.6 ml LV SV MOD A4C:     65.5 ml LV SV MOD BP:      41.6 ml RIGHT VENTRICLE TAPSE (M-mode): 1.8 cm LEFT ATRIUM             Index       RIGHT ATRIUM           Index LA diam:        4.80 cm 3.11 cm/m  RA Area:     19.80 cm LA Vol (A2C):   74.6 ml 48.31 ml/m RA Volume:   60.10 ml  38.92 ml/m LA Vol (A4C):   85.1 ml 55.11 ml/m LA Biplane Vol: 82.4 ml 53.36 ml/m  AORTIC VALVE LVOT Vmax:   70.90 cm/s LVOT Vmean:  53.200 cm/s LVOT VTI:    0.201 m  AORTA Ao Root diam: 3.50 cm TRICUSPID VALVE TR Peak grad:   26.4 mmHg TR Vmax:         257.00 cm/s  SHUNTS Systemic VTI:  0.20 m Systemic Diam: 1.80 cm Lyman Bishop MD Electronically signed by Lyman Bishop MD Signature Date/Time: 08/12/2020/11:44:13 AM    Final    CT MAXILLOFACIAL WO CONTRAST  Result Date: 08/12/2020 CLINICAL DATA:  Fall,  left supraorbital laceration EXAM: CT HEAD WITHOUT CONTRAST CT MAXILLOFACIAL WITHOUT CONTRAST CT CERVICAL SPINE WITHOUT CONTRAST TECHNIQUE: Multidetector CT imaging of the head, cervical spine, and maxillofacial structures were performed using the standard protocol without intravenous contrast. Multiplanar CT image reconstructions of the cervical spine and maxillofacial structures were also generated. COMPARISON:  MR cervical spine 06/23/2010, CT head 01/31/2020, MR/MRA head 03/20/2015 FINDINGS: CT HEAD FINDINGS Brain: Hyperdense subarachnoid hemorrhage within the sulci of the left frontal lobe (3/16). Subdural collection over the left frontoparietal convexity measuring up to 4 mm in maximal thickness (3/14). Trace subdural hemorrhage layering along the left tentorium up to 3 mm in maximal thickness (6/36). No other concerning sites of hemorrhage. Cortical hyperattenuation along the mesial aspect of the right occipital lobe is present on comparison and may reflect sequela of prior vascular insult. This is in addition to a stable right temporal region of encephalomalacia with ex vacuo dilatation of the right temporal horn. Stable right para falcine lesion measuring 5 x 11 mm, likely a small meningioma with small dural tails. Additional features of moderate diffuse parenchymal volume loss. Patchy areas of white matter hypoattenuation are most compatible with chronic microvascular angiopathy. No midline shift or transtentorial herniation. No CT evidence of large territory infarct. Basal cisterns are patent. Vascular: Atherosclerotic calcification of the carotid siphons and intradural vertebral arteries. No hyperdense vessel. Skull: Left frontal and  supraorbital scalp swelling with laceration extending into the periorbital soft tissues, as below. No visible calvarial fracture. No other significant sites of scalp contusion or hematoma. Other: None. CT MAXILLOFACIAL FINDINGS Osseous: The osseous structures appear diffusely demineralized which may limit detection of small or nondisplaced fractures. No fracture of the bony orbits. Nasal bones are intact. There is a ill-defined, slightly expansile lesion measuring approximately 9 x 10 x31 mm (4/19, 10/39) centered in the anteroinferior glabellar process of the frontal bone. Comparison to prior MR and CT imaging reveals a similar appearance on prior studies. No other mid face fractures are seen. The pterygoid plates are intact. No visible or suspected temporal bone fractures. Temporomandibular joints are normally aligned. The mandible is intact. No fractured or avulsed teeth. Scattered carious lesions and few apical lucencies of the maxillary and mandibular dentition. Orbits: Left supraorbital/periorbital soft tissue swelling, with laceration and few punctate foci of soft tissue gas confined to the preseptal soft tissues. No retro septal gas, stranding or hemorrhage. The globes appear grossly normal and symmetric with prior bilateral lens extractions. Likely benign scleral calcification on the right. Symmetric appearance of the extraocular musculature and optic nerve sheath complexes. Normal caliber of the superior ophthalmic veins. Sinuses: Paranasal sinuses and mastoid air cells are predominantly clear. Middle ear cavities are clear. Debris in the bilateral external auditory canals. Ossicular chains are normally configured. Soft tissues: Left supraorbital/periorbital soft tissue swelling and laceration. No other significant soft tissue findings, gas or foreign body. CT CERVICAL SPINE FINDINGS Alignment: Stabilization collar absent. Mild rightward lateral flexion and cranial rotation. Straightening of the normal  cervical lordosis with some focal reversal centered at the C4 level. Stepwise anterolisthesis C2-C4 is favored to be on a spondylitic basis. Additional anterolisthesis C7 on T1 also likely degenerative and not significantly changed comparison Skull base and vertebrae: The osseous structures appear diffusely demineralized which may limit detection of small or nondisplaced fractures. No acute skull base fracture. No vertebral body fracture or height loss. Progressive spondylitic fusion across the C4-5 levels. Multilevel spondylitic changes throughout the cervical spine as detailed below. Additional moderate to severe  arthrosis at the atlantodental and basion dens intervals. No suspicious lytic or blastic lesions in the cervical spine. Soft tissues and spinal canal: No pre or paravertebral fluid or swelling. No visible canal hematoma. Disc levels: Multilevel intervertebral disc height loss with spondylitic endplate changes., larger disc osteophyte complexes and posterior osseous ridging is pronounced at C3-4, C4-5, C5-6 and C6-7 resulting in mild-to-moderate canal stenosis at these levels. Additional diffuse uncinate spurring and facet hypertrophic changes result in mild-to-moderate multilevel neural foraminal narrowing with more moderate to severe foraminal narrowing on the left at C3-4, C4-5. Upper chest: Biapical pleuroparenchymal scarring. Calcification of the proximal great vessels and cervical carotids, particular at the bifurcations. Normal thyroid. Other: None. IMPRESSION: 1. Hyperdense subarachnoid hemorrhage within the sulci of the left frontal lobe. Subdural hemorrhage over the left frontoparietal convexity measuring up to 4 mm in maximal thickness. Trace subdural hemorrhage layering along the left tentorium up to 3 mm in maximal thickness. 2. Left frontal and supraorbital scalp swelling and laceration extending. No visible calvarial or acute facial bone fracture. 3. Encephalomalacia of the right occipital  lobe and right temporal lobe, unchanged from prior. 4. Stable 11 mm right parafalcine lesion, likely a small meningioma. 5. Indeterminate though stable expansile lesion centered in the anterior glabellar process of the frontal bone. 6. No evidence of acute fracture or traumatic listhesis of the cervical spine. 7. Multilevel spondylitic changes throughout the cervical spine, as described. 8. Progressive spondylitic fusion across the C4-5 levels. 9. Scattered carious lesions and few apical lucencies of the maxillary and mandibular dentition. Correlate with dental exam. 10. Cervical and intracranial atherosclerosis. Critical Value/emergent results were called by telephone at the time of interpretation on 08/12/2020 at 1:07 am to provider [facility] , who verbally acknowledged these results. Electronically Signed   By: Lovena Le M.D.   On: 08/12/2020 01:07       Oswald Hillock   [facility] If 7PM-7AM, please contact night-coverage at [email or web address], Office  [phone]   08/13/2020, 5:35 PM  LOS: 0 days

## 2020-08-13 NOTE — Evaluation (Signed)
Occupational Therapy Evaluation Patient Details Name: [name] MRN: [id number] DOB: 1917-10-29 Today's Date: 08/13/2020    History of Present Illness Patient  86 year old female with history of right temporal intracranial hemorrhage with hypertensive crisis in 2016, hypertension, macular degeneration, meningioma, paroxysmal atrial fibrillation, stable amyloid angiopathy, hyperlipidemia, CKD stage III presented to [facility] hospital after a fall at home.  Patient says she was in dining room yesterday evening getting a cookie for her husband when she suddenly felt her foot got stuck and fell to the floor.  There was no loss of consciousness.  She was brought to ED 08/11/2020.  CT head showed subarachnoid hemorrhage in the sulcus of the left frontal lobe as well as subdural hemorrhage over the left frontoparietal convexity measuring 4 cm in maximum thickness. Pt also with LT shoulder to elbow pain with X-ray negative for fracture or acute abnomality.   Clinical Impression   Patient is currently requiring assistance with ADLs including total with toileting, and LE/UE dressing, and maximum assist with bathing and grooming, and minimal assist with feeding, all of which is below patient's typical baseline of being Modified independent.  During this evaluation, patient was limited by LT UE severe pain, generalized weakness and increased anxiety, confusion and disorientation with mobility, all of which has the potential to impact patient's and caregivers' safety and independence during functional mobility, as well as performance for ADLs. [place] "6-clicks" Daily Activity Inpatient Short Form score of 10/24 indicates 74.70% ADL impairment this session. Patient lives with her husband who is WC bound with family upstairs and a caregiver who comes daily. Family is working to increase caregiver hours.  Patient cannot yet mobilize or perform ADLs safely enough to return home with family/CG  assist, demonstrates good rehab potential, and should benefit from continued skilled occupational therapy services while in acute care to maximize safety, independence and quality of life at home.  Continued occupational therapy services in a SNF setting prior to return home is recommended.  ?   Follow Up Recommendations  SNF    Equipment Recommendations       Recommendations for Other Services       Precautions / Restrictions Precautions Precautions: Fall Required Braces or Orthoses: Sling Restrictions Weight Bearing Restrictions: No (Treating LUE as NWB due to pain and sling order)      Mobility Bed Mobility Overal bed mobility: Needs Assistance Bed Mobility: Supine to Sit;Sit to Supine;Rolling Rolling: Mod assist   Supine to sit: Mod assist Sit to supine: Mod assist   General bed mobility comments: Step by step multimodal cues with hand over hand to guide RUE to bed rail.    Transfers Overall transfer level: Needs assistance   Transfers: Sit to/from Stand Sit to Stand: Max assist;+2 physical assistance;From elevated surface         General transfer comment: Hand held assist support on R UE for sit to stand, pt very anxious and required increased time and reassurance. Pt stood with need of feet blocked and pt using back of legs heavily on bed.  Pt lowered back to sitting as pt reporting need and better control on descent and need of Mod As. Attempted 2nd stand, however pt became overly anxious and pushing herself back onto bed so terminated task. Pt also had increased confusion at this time (see cognition).    Balance Overall balance assessment: Needs assistance Sitting-balance support: Feet supported Sitting balance-Leahy Scale: Poor Sitting balance - Comments: Pt leans posteriorly and to RT.  Needs Min Assist at times for steadying.   Standing balance support: Single extremity supported Standing balance-Leahy Scale: Poor Standing balance comment: Needs Max As to  maintain stand for <5 seconds with RT HHA and bracing LEs against bed.                           ADL either performed or assessed with clinical judgement   ADL Overall ADL's : Needs assistance/impaired Eating/Feeding: Minimal assistance;Bed level   Grooming: Sitting;Total assistance;Minimal assistance;Brushing hair;Oral care Grooming Details (indicate cue type and reason): Sitting EOB, pt able to perform oral care with full setup and Min As, frequent cues to relax LUE while using RUE, Pt required Total As from daughter to brush hair. Upper Body Bathing: Maximal assistance;Sitting   Lower Body Bathing: Total assistance;Bed level   Upper Body Dressing : Total assistance Upper Body Dressing Details (indicate cue type and reason): Total Assist to don LUE sling. Lower Body Dressing: Total assistance;Sitting/lateral leans Lower Body Dressing Details (indicate cue type and reason): Total assist to adjust socks while sitting EOB.   Toilet Transfer Details (indicate cue type and reason): Unable to safety pivot. Please see Mobility section. Toileting- Clothing Manipulation and Hygiene: Total assistance;Sitting/lateral lean Toileting - Clothing Manipulation Details (indicate cue type and reason): Pt able to void bladder volitionally prior to removal of pure wick to prepare for mobilization, however pt unable to perform peri care or clothing mngt without Total Assist.     Functional mobility during ADLs: Maximal assistance;+2 for physical assistance       Vision Baseline Vision/History: Macular Degeneration       Perception     Praxis      Pertinent Vitals/Pain Pain Assessment: 0-10 Pain Score: 10-Worst pain ever Pain Location: Pt reporting most intense pain at Elbow, but entire LUE seems very painful to all mvoement and touch.  LT sling found in room and applied. Pt and daughter ed on positioning and donning. Pain Descriptors / Indicators: Stabbing;Sharp;Shooting;Sore Pain  Intervention(s): Limited activity within patient's tolerance;Monitored during session;Repositioned;Utilized relaxation techniques (Daughter reports that pt does not like ice as it irritates her OA.)     Hand Dominance Right   Extremity/Trunk Assessment Upper Extremity Assessment Upper Extremity Assessment: LUE deficits/detail;Generalized weakness;RUE deficits/detail RUE Deficits / Details: Grossly 4-/5 LUE: Unable to fully assess due to immobilization;Unable to fully assess due to pain LUE Coordination: decreased fine motor (Pt unable to made composite fist or oppose due to pain. ~1/2 AROM to wrist tolerated.)   Lower Extremity Assessment Lower Extremity Assessment: Generalized weakness       Communication Communication Communication: No difficulties;Other (comment)   Cognition Arousal/Alertness: Awake/alert Behavior During Therapy: Anxious Overall Cognitive Status: Impaired/Different from baseline Area of Impairment: Orientation;Following commands                 Orientation Level: Place;Situation     Following Commands: Follows multi-step commands inconsistently       General Comments: Pt initially oriented and following commands.  With increased mobility, especially after a stand, pt begane to ask, "Where am I? "What am I doing?"  Pt had to look at OT's badge and read to confirm, "Oh, I'm at [facility]."  Pt very anxious throughout visit and reassured with cues for relaxed breathing and relaxing LUE ad lib   General Comments       Exercises Shoulder Exercises Wrist Flexion: AROM;5 reps;Left Wrist Extension: AROM;5 reps;Left Composite Extension: Left;Other (comment) (Attemtped  but too painful and terminated)   Shoulder Instructions      Home Living Family/patient expects to be discharged to:: Skilled nursing facility Living Arrangements: Spouse/significant other;Children Available Help at Discharge: Family;Personal care attendant Type of Home: House Home  Access: Stairs to enter [employer] of Steps: 15, but there is also a chair lift on these steps Entrance Stairs-Rails: Left Home Layout: One level         Bathroom Toilet: Handicapped height     Home Equipment: Grab bars - tub/shower;Shower seat - built in;Grab bars - toilet;Walker - 2 wheels;Wheelchair - Education administrator (comment);Hand held shower head;Walker - 4 wheels (Lift recliner, adjustable bed (can get rail per dtr))   Additional Comments: Pt and and spouse live in a basement apartment in her daughter and son-in-law's house. Everythin is very handicap accessible due to her husband also in a WC. They enter into their daughter's house and then do have to go down 16 steps to their apartment, but there is a chair lift. Pt has alwyas used the steps becuase she likes the exercise. They have a caregiver that comes every morning, but the dtr stated they are adding more help and increase time to help them both if needed. They have life alert and multiple safety features. Pt used RW at baseline in the apratment but really liked to stay active and on her feet. Still does her banking, etc. Very active and sharp for her age.      Prior Functioning/Environment Level of Independence: Independent with assistive device(s)        Comments: Pt ambualtes with a RW at home, and reports being independent with all ADLs.        OT Problem List: Decreased strength;Impaired UE functional use;Pain;Decreased activity tolerance;Decreased safety awareness;Impaired balance (sitting and/or standing);Decreased knowledge of use of DME or AE;Decreased knowledge of precautions;Decreased range of motion;Decreased cognition;Decreased coordination      OT Treatment/Interventions: Self-care/ADL training;Therapeutic exercise;Therapeutic activities;Cognitive remediation/compensation;Energy conservation;DME and/or AE instruction;Patient/family education;Balance training;Manual therapy;Modalities    OT  Goals(Current goals can be found in the care plan section) Acute Rehab OT Goals Patient Stated Goal: Pain management to LUE ADL Goals Pt Will Perform Grooming: sitting;with set-up Pt Will Perform Upper Body Bathing: with min guard assist;with adaptive equipment;standing (Using dangle method to LLUE if pt cleared and can tolerate) Pt Will Perform Upper Body Dressing: with min assist;sitting Pt Will Perform Lower Body Dressing: with supervision;with adaptive equipment;sit to/from stand;sitting/lateral leans Pt Will Transfer to Toilet: with supervision;ambulating;bedside commode Pt Will Perform Toileting - Clothing Manipulation and hygiene: with supervision;with adaptive equipment;sitting/lateral leans;sit to/from stand  OT Frequency: Min 2X/week   Barriers to D/C:    Pt currently requiring a higher degree of assistance than family can safely provide.       Co-evaluation              AM-PAC OT "6 Clicks" Daily Activity     Outcome Measure Help from another person eating meals?: A Little Help from another person taking care of personal grooming?: A Lot Help from another person toileting, which includes using toliet, bedpan, or urinal?: Total Help from another person bathing (including washing, rinsing, drying)?: A Lot Help from another person to put on and taking off regular upper body clothing?: Total Help from another person to put on and taking off regular lower body clothing?: Total 6 Click Score: 10   End of Session Equipment Utilized During Treatment: Gait belt (Sling)  Activity Tolerance: Patient limited by pain Patient  left: in bed;with call bell/phone within reach;with family/visitor present (X-ray tech in room)  OT Visit Diagnosis: Unsteadiness on feet (R26.81);History of falling (Z91.81);Muscle weakness (generalized) (M62.81);Pain Pain - Right/Left: Left Pain - part of body: Shoulder;Arm;Hand                Time: [id number] OT Time Calculation (min): 48 min Charges:   OT General Charges $OT Visit: 1 Visit OT Evaluation $OT Eval Moderate Complexity: 1 Mod OT Treatments $Self Care/Home Management : 8-22 mins $Therapeutic Activity: 8-22 mins  Anderson Malta, OT Acute Rehab Services Office: [phone] 08/13/2020  Julien Girt 08/13/2020, 1:48 PM

## 2020-08-14 ENCOUNTER — Inpatient Hospital Stay (HOSPITAL_COMMUNITY): Payer: Medicare Other

## 2020-08-14 DIAGNOSIS — I609 Nontraumatic subarachnoid hemorrhage, unspecified: Secondary | ICD-10-CM | POA: Diagnosis not present

## 2020-08-14 DIAGNOSIS — S065X9A Traumatic subdural hemorrhage with loss of consciousness of unspecified duration, initial encounter: Secondary | ICD-10-CM | POA: Diagnosis not present

## 2020-08-14 DIAGNOSIS — N1831 Chronic kidney disease, stage 3a: Secondary | ICD-10-CM | POA: Diagnosis not present

## 2020-08-14 DIAGNOSIS — W19XXXA Unspecified fall, initial encounter: Secondary | ICD-10-CM | POA: Diagnosis not present

## 2020-08-14 MED ORDER — ACETAMINOPHEN 500 MG PO TABS: 500.0000 mg | ORAL_TABLET | Freq: Four times a day (QID) | ORAL | 0 refills | Status: DC | PRN

## 2020-08-14 MED ORDER — ACETAMINOPHEN 500 MG PO TABS
1000.0000 mg | ORAL_TABLET | Freq: Four times a day (QID) | ORAL | Status: DC | PRN
  Administered 2020-08-14 – 2020-08-15 (×3): 1000 mg via ORAL
  Filled 2020-08-14 (×3): qty 2

## 2020-08-14 MED ORDER — TRAMADOL HCL 50 MG PO TABS: 50.0000 mg | ORAL_TABLET | Freq: Two times a day (BID) | ORAL | 0 refills | Status: DC | PRN

## 2020-08-14 MED ORDER — ACETAMINOPHEN 650 MG RE SUPP: 650.0000 mg | Freq: Four times a day (QID) | RECTAL | Status: DC | PRN

## 2020-08-14 MED ORDER — TRAMADOL HCL 50 MG PO TABS
50.0000 mg | ORAL_TABLET | Freq: Two times a day (BID) | ORAL | Status: DC | PRN
  Administered 2020-08-14 – 2020-08-16 (×3): 50 mg via ORAL
  Filled 2020-08-14 (×3): qty 1

## 2020-08-14 NOTE — Progress Notes (Signed)
PROGRESS NOTE  [name]  [id number] DOB: [birth year] DOA: 08/11/2020 PCP: Marin Olp, MD   Brief Narrative: 86 year old female with history of right temporal intracranial hemorrhage with hypertensive crisis in 2016, hypertension, macular degeneration, meningioma, paroxysmal atrial fibrillation, stable amyloid angiopathy, hyperlipidemia, CKD stage III presented to [facility] long hospital after a fall at home. Patient says she was in dining room yesterday evening getting a cookie for her husband when she suddenly felt stuck in a box and fell to the floor. There was no loss of consciousness. She was brought to ED. CT head showed subarachnoid hemorrhage in the sulcus of the left frontal lobe as well as subdural hemorrhage over the left frontoparietal convexity measuring 4 cm in maximum thickness. Neurosurgery was consulted.  Assessment & Plan: Principal Problem:   Subdural hematoma (HCC) Active Problems:   Hypertensive urgency   Mixed hyperlipidemia   Chronic kidney disease, stage 3a ([place])   Fall at home, initial encounter   Subarachnoid hemorrhage following injury ([place])   SAH (subarachnoid hemorrhage) ([place])  Traumatic subarachnoid hemorrhage: No neurological deficits, no further diagnostic imaging or treatment recommended by neurosurgery.   Mechanical fall at home: SNF recommended for rehabilitation but politely declined by patient and family who have arranged significant assistance at home.  - Continue PT, OT at home. Maximizing home health assistance at discharge with CM assistance. Pt's daughter reports having all the DME they need.  - Supraorbital laceration was repaired with gut sutures by EDP which appears to be well-apposed and should dissolve without need for removal.   HTN: - Continue amlodipine 5 mg, losartan 100 mg daily.   - Continue (new) hydralazine 25 mg p.o. every 6 hours as needed for BP more than 160/100.  History of left wrist fracture, now with acute  left wrist pain: Note left shoulder and elbow plain films have been performed during this hospitalization without acute-appearing changes, though there is considerable degenerative change and corticated radial head fracture suggestive of subacute/remote fracture with small joint effusion.  - D/w orthopedics 12/21, Dr. Lorin Mercy, who recommended left should sling and outpatient follow up.  - Check left wrist XR this PM to r/o acute fracture and splint as indicated.  - Tylenol augmented dose as pain control is limiting her ability to mobilize. Heating pad brought by daughter. Apply diclofenac gel topically.  - Oxycodone has improved pain but may be worsening delirium, will trial tramadol. Could also trial 2.5mg  oxy dosing.   Acute delirium: Worsened by hospitalization, pain, analgesics.  - Aim to limit duration of hospitalization, encourage frequent visitation by family.   DVT prophylaxis: SCDs Code Status: Full (need to discuss with family further) Family Communication: Daughter at bedside on both AM and PM rounds Disposition Plan:  Status is: Inpatient  Remains inpatient appropriate because:Ongoing active pain requiring inpatient pain management and Altered mental status. Pain is limiting mobility where home discharge is not safe. Pt also not at mental baseline.  Dispo: The patient is from: Home              Anticipated d/c is to: Home              Anticipated d/c date is: 1 day              Patient currently is not medically stable to d/c.  Consultants:   Orthopedics, neurosurgery  Procedures:   None  Antimicrobials:  None   Subjective: Pain in left elbow this morning is constant, severe, present with any  movement of palpation. No mention of pain in wrist this AM. This PM she is moaning in pain with any movement or palpation of any part of the left wrist, forearm, or hand. She has been out of it and not at mental baseline per daughter.   Objective: Vitals:   08/13/20 1335 08/13/20  2002 08/14/20 0554 08/14/20 1347  BP: (!) 177/71 137/86 (!) 149/66 (!) 143/70  Pulse: 61 74 63 79  Resp: 18 18 14 18   Temp: 99.6 F (37.6 C) 98.4 F (36.9 C) 98.1 F (36.7 C) 98.8 F (37.1 C)  TempSrc: Oral Oral Oral Oral  SpO2: 92% 94% 94% 94%  Weight:      Height:        Intake/Output Summary (Last 24 hours) at 08/14/2020 1902 Last data filed at 08/14/2020 1700 Gross per 24 hour  Intake 315 ml  Output 950 ml  Net -635 ml   Filed Weights   08/12/20 2011  Weight: 59.6 kg   Gen: Elderly female in no distress Pulm: Non-labored breathing. Clear to auscultation bilaterally.  CV: Regular rate and rhythm. No murmur, rub, or gallop. No JVD, no pitting pedal edema. GI: Abdomen soft, non-tender, non-distended, with normoactive bowel sounds. No organomegaly or masses felt. Ext: Warm, no deformities visible or palpable with attention to LUE. Skin: No rashes, lesions or ulcers on visualized skin Neuro: Alert and slowed cognition incompletely oriented. Moves all extremities. Psych: Judgement and insight appear impaired.  Data Reviewed: I have personally reviewed following labs and imaging studies  CBC: Recent Labs  Lab 08/12/20 0209 08/12/20 0628  WBC 10.0 7.9  NEUTROABS  --  5.7  HGB 12.2 9.6*  HCT 37.3 29.9*  MCV 89.7 91.2  PLT 170 [id number]   Basic Metabolic Panel: Recent Labs  Lab 08/12/20 0209 08/12/20 0628  NA 141 143  K 3.9 3.5  CL 106 106  CO2 25 25  GLUCOSE 134* 119*  BUN 24* 20  CREATININE 0.86 0.70  CALCIUM 8.6* 8.1*  MG  --  1.9   GFR: Estimated Creatinine Clearance: 30.2 mL/min (by C-G formula based on SCr of 0.7 mg/dL). Liver Function Tests: Recent Labs  Lab 08/12/20 0628  AST 23  ALT 17  ALKPHOS 53  BILITOT 0.8  PROT 6.0*  ALBUMIN 3.3*   No results for input(s): LIPASE, AMYLASE in the last 168 hours. No results for input(s): AMMONIA in the last 168 hours. Coagulation Profile: Recent Labs  Lab 08/12/20 0209  INR 1.1   Cardiac  Enzymes: No results for input(s): CKTOTAL, CKMB, CKMBINDEX, TROPONINI in the last 168 hours. BNP (last 3 results) No results for input(s): PROBNP in the last 8760 hours. HbA1C: No results for input(s): HGBA1C in the last 72 hours. CBG: Recent Labs  Lab 08/12/20 1558  GLUCAP 112*   Lipid Profile: No results for input(s): CHOL, HDL, LDLCALC, TRIG, CHOLHDL, LDLDIRECT in the last 72 hours. Thyroid Function Tests: No results for input(s): TSH, T4TOTAL, FREET4, T3FREE, THYROIDAB in the last 72 hours. Anemia Panel: No results for input(s): VITAMINB12, FOLATE, FERRITIN, TIBC, IRON, RETICCTPCT in the last 72 hours. Urine analysis:    Component Value Date/Time   COLORURINE YELLOW 08/13/2020 0000   APPEARANCEUR CLEAR 08/13/2020 0000   LABSPEC 1.014 08/13/2020 0000   PHURINE 7.0 08/13/2020 0000   GLUCOSEU NEGATIVE 08/13/2020 0000   GLUCOSEU NEGATIVE 04/28/2017 1155   HGBUR NEGATIVE 08/13/2020 0000   BILIRUBINUR NEGATIVE 08/13/2020 0000   BILIRUBINUR neg 09/27/2015 1130   KETONESUR NEGATIVE  08/13/2020 0000   PROTEINUR NEGATIVE 08/13/2020 0000   UROBILINOGEN 0.2 04/28/2017 1155   NITRITE NEGATIVE 08/13/2020 0000   LEUKOCYTESUR NEGATIVE 08/13/2020 0000   Recent Results (from the past 240 hour(s))  Resp Panel by RT-PCR (Flu A&B, Covid) Nasopharyngeal Swab     Status: None   Collection Time: 08/12/20  6:28 AM   Specimen: Nasopharyngeal Swab; Nasopharyngeal(NP) swabs in vial transport medium  Result Value Ref Range Status   SARS Coronavirus 2 by RT PCR NEGATIVE NEGATIVE Final    Comment: (NOTE) SARS-CoV-2 target nucleic acids are NOT DETECTED.  The SARS-CoV-2 RNA is generally detectable in upper respiratory specimens during the acute phase of infection. The lowest concentration of SARS-CoV-2 viral copies this assay can detect is 138 copies/mL. A negative result does not preclude SARS-Cov-2 infection and should not be used as the sole basis for treatment or other patient management  decisions. A negative result may occur with  improper specimen collection/handling, submission of specimen other than nasopharyngeal swab, presence of viral mutation(s) within the areas targeted by this assay, and inadequate number of viral copies(<138 copies/mL). A negative result must be combined with clinical observations, patient history, and epidemiological information. The expected result is Negative.  Fact Sheet for Patients:  BloggerCourse.comhttps://www.fda.gov/media/152166/download  Fact Sheet for Healthcare Providers:  SeriousBroker.ithttps://www.fda.gov/media/152162/download  This test is no t yet approved or cleared by the [place] FDA and  has been authorized for detection and/or diagnosis of SARS-CoV-2 by FDA under an Emergency Use Authorization (EUA). This EUA will remain  in effect (meaning this test can be used) for the duration of the COVID-19 declaration under Section 564(b)(1) of the Act, 21 U.S.C.section 360bbb-3(b)(1), unless the authorization is terminated  or revoked sooner.       Influenza A by PCR NEGATIVE NEGATIVE Final   Influenza B by PCR NEGATIVE NEGATIVE Final    Comment: (NOTE) The Xpert Xpress SARS-CoV-2/FLU/RSV plus assay is intended as an aid in the diagnosis of influenza from Nasopharyngeal swab specimens and should not be used as a sole basis for treatment. Nasal washings and aspirates are unacceptable for Xpert Xpress SARS-CoV-2/FLU/RSV testing.  Fact Sheet for Patients: BloggerCourse.comhttps://www.fda.gov/media/152166/download  Fact Sheet for Healthcare Providers: SeriousBroker.ithttps://www.fda.gov/media/152162/download  This test is not yet approved or cleared by the [place] FDA and has been authorized for detection and/or diagnosis of SARS-CoV-2 by FDA under an Emergency Use Authorization (EUA). This EUA will remain in effect (meaning this test can be used) for the duration of the COVID-19 declaration under Section 564(b)(1) of the Act, 21 U.S.C. section 360bbb-3(b)(1), unless the  authorization is terminated or revoked.  Performed at [facility], [address] [place], [place] [address]       Radiology Studies: DG Elbow 2 Views Left  Result Date: 08/13/2020 CLINICAL DATA:  Elbow pain EXAM: LEFT ELBOW - 2 VIEW COMPARISON:  08/11/2020 FINDINGS: There are changes consistent with prior fracture of the radial head. The fracture fragment is well corticated and likely of a subacute to chronic nature. Small joint effusion is noted new from the prior exam. No other focal abnormality is noted. IMPRESSION: Changes consistent with prior subacute to chronic fracture in the radial head. Small joint effusion is noted. Electronically Signed   By: Alcide CleverMark  Lukens M.D.   On: 08/13/2020 13:24    Scheduled Meds: . amLODipine  10 mg Oral Daily  . atorvastatin  40 mg Oral QHS  . losartan  100 mg Oral QHS   Continuous Infusions:  LOS: 1 day   Time spent: 25 minutes.  Patrecia Pour, MD [facility] [email or web address] 08/14/2020, 7:02 PM

## 2020-08-14 NOTE — TOC Progression Note (Signed)
Transition of Care [facility]) - Progression Note    Patient Details  Name: [name] MRN: [id number] Date of Birth: Oct 18, 1917  [facility]) CM/SW Contact  Purcell Mouton, RN Phone Number: 08/14/2020, 12:09 PM  Clinical Narrative:     Spoke with pt's daughter concerning discharge plan. Daughter selected [facility]. A call to [facility] in house rep revealed that start of care is 12/23 in the home.        Expected Discharge Plan and Services           Expected Discharge Date: 08/14/20                                     Social Determinants of Health (SDOH) Interventions    Readmission Risk Interventions No flowsheet data found.

## 2020-08-14 NOTE — TOC Progression Note (Signed)
Transition of Care [facility]) - Progression Note    Patient Details  Name: [name] MRN: [id number] Date of Birth: 1918-01-16  Transition of Care [facility]) CM/SW Contact  Purcell Mouton, RN Phone Number: 08/14/2020, 12:16 PM  Clinical Narrative:    Pt will discharge home with family and HH.   Expected Discharge Plan: [place] Barriers to Discharge: No Barriers Identified  Expected Discharge Plan and Services Expected Discharge Plan: [employer]         Expected Discharge Date: 08/14/20                         HH Arranged: RN,PT,OT,Nurse's Aide HH Agency:  ([facility]) Date HH Agency Contacted: 08/14/20 Time Felt: 1215 Representative spoke with at [place]: Amy   Social Determinants of Health ([place]) Interventions    Readmission Risk Interventions No flowsheet data found.

## 2020-08-15 ENCOUNTER — Inpatient Hospital Stay (HOSPITAL_COMMUNITY): Payer: Medicare Other

## 2020-08-15 DIAGNOSIS — W19XXXA Unspecified fall, initial encounter: Secondary | ICD-10-CM | POA: Diagnosis not present

## 2020-08-15 DIAGNOSIS — S065X9A Traumatic subdural hemorrhage with loss of consciousness of unspecified duration, initial encounter: Secondary | ICD-10-CM | POA: Diagnosis not present

## 2020-08-15 DIAGNOSIS — N1831 Chronic kidney disease, stage 3a: Secondary | ICD-10-CM | POA: Diagnosis not present

## 2020-08-15 DIAGNOSIS — I609 Nontraumatic subarachnoid hemorrhage, unspecified: Secondary | ICD-10-CM | POA: Diagnosis not present

## 2020-08-15 LAB — CBC WITH DIFFERENTIAL/PLATELET
Abs Immature Granulocytes: 0.06 10*3/uL (ref 0.00–0.07)
Basophils Absolute: 0 10*3/uL (ref 0.0–0.1)
Basophils Relative: 0 %
Eosinophils Absolute: 0.1 10*3/uL (ref 0.0–0.5)
Eosinophils Relative: 0 %
HCT: 36 % (ref 36.0–46.0)
Hemoglobin: 11.7 g/dL — ABNORMAL LOW (ref 12.0–15.0)
Immature Granulocytes: 1 %
Lymphocytes Relative: 12 %
Lymphs Abs: 1.3 10*3/uL (ref 0.7–4.0)
MCH: 29.2 pg (ref 26.0–34.0)
MCHC: 32.5 g/dL (ref 30.0–36.0)
MCV: 89.8 fL (ref 80.0–100.0)
Monocytes Absolute: 2 10*3/uL — ABNORMAL HIGH (ref 0.1–1.0)
Monocytes Relative: 18 %
Neutro Abs: 7.8 10*3/uL — ABNORMAL HIGH (ref 1.7–7.7)
Neutrophils Relative %: 69 %
Platelets: 164 10*3/uL (ref 150–400)
RBC: 4.01 MIL/uL (ref 3.87–5.11)
RDW: 14.3 % (ref 11.5–15.5)
WBC: 11.3 10*3/uL — ABNORMAL HIGH (ref 4.0–10.5)
nRBC: 0 % (ref 0.0–0.2)

## 2020-08-15 NOTE — Progress Notes (Signed)
Occupational Therapy Treatment Patient Details Name: [name] MRN: [id number] DOB: [birth year] Today's Date: 08/15/2020    History of present illness Patient  86 year old female with history of right temporal intracranial hemorrhage with hypertensive crisis in 2016, hypertension, macular degeneration, meningioma, paroxysmal atrial fibrillation, stable amyloid angiopathy, hyperlipidemia, CKD stage III presented to [facility] hospital after a fall at home.  CT head showed subarachnoid hemorrhage in the sulcus of the left frontal lobe as well as subdural hemorrhage over the left frontoparietal convexity measuring 4 cm in maximum thickness. Pt has been complaining of left elbow pain and left wrist pain. Per Ortho note 12/23 radial head fracture more acute than originally thought.   OT comments  Patient continues to have complaints of left elbow pain. Patient's posterior elbow swollen and redness noted in bicep area which MD and RN are aware of. Patient'd daughter brought in home wrist splint which therapist applied. Patient mod assist to transfer to side of bed, mod assist to stand from elevated bed height and min assist to ambulate around the bed to recliner. Patient fearful but with firm encouragement did well with therapist assisting with guiding the RW. Therapist applied ice to elbow to attempt to assist with pain. Patient's daughter reports the plan is for patient to return home with family assistance.  If patient continues to progress and pain becomes management - recommend HH OT at discharge.   Follow Up Recommendations  Home health OT    Equipment Recommendations       Recommendations for Other Services      Precautions / Restrictions Precautions Precautions: Fall Precaution Comments: Sling to immobilize LUE, no formal NWB status at this time. Hx of left wrist fracture - splint from home applied to left wrist Required Braces or Orthoses: Sling Restrictions Other  Position/Activity Restrictions: L UE: no order however keeping NWB due to sling and elbow pain       Mobility Bed Mobility Overal bed mobility: Needs Assistance Bed Mobility: Supine to Sit     Supine to sit: Mod assist;HOB elevated     General bed mobility comments: assist for trunk and scooting to EOB. Patient complaining of elbow pain.  Transfers Overall transfer level: Needs assistance Equipment used: Rolling walker (2 wheeled) Transfers: Sit to/from [facility] Sit to Stand: Mod assist;From elevated surface Stand pivot transfers: Min assist       General transfer comment: Mod assist to stand from elevated bed height. Left hand placed lightly on walker. Min assist to ambulate around the bed to recliner with therapist assisting with guiding walker on left. Patient fearful with standing and ambulating.    Balance Overall balance assessment: Needs assistance Sitting-balance support: No upper extremity supported;Feet supported Sitting balance-Leahy Scale: Fair     Standing balance support: Single extremity supported Standing balance-Leahy Scale: Poor                             ADL either performed or assessed with clinical judgement   ADL       Grooming: Wash/dry face;Sitting;Wash/dry hands Grooming Details (indicate cue type and reason): grooming task sitting in recliner                                     Vision Baseline Vision/History: Macular Degeneration Patient Visual Report: No change from baseline     Perception  Praxis      Cognition Arousal/Alertness: Awake/alert Behavior During Therapy: WFL for tasks assessed/performed Overall Cognitive Status: Within Functional Limits for tasks assessed                                          Exercises     Shoulder Instructions       General Comments      Pertinent Vitals/ Pain       Pain Assessment: Faces Faces Pain Scale: Hurts whole  lot Pain Location: left elbow Pain Descriptors / Indicators: Grimacing;Sore;Sharp;Guarding Pain Intervention(s): Monitored during session;Repositioned;Ice applied  Home Living                                          Prior Functioning/Environment              Frequency  Min 2X/week        Progress Toward Goals  OT Goals(current goals can now be found in the care plan section)  Progress towards OT goals: Progressing toward goals  Acute Rehab OT Goals Patient Stated Goal: Pain management to LUE  Plan Discharge plan needs to be updated    Co-evaluation                 AM-PAC OT "6 Clicks" Daily Activity     Outcome Measure   Help from another person eating meals?: A Little Help from another person taking care of personal grooming?: A Little Help from another person toileting, which includes using toliet, bedpan, or urinal?: A Lot Help from another person bathing (including washing, rinsing, drying)?: A Lot Help from another person to put on and taking off regular upper body clothing?: A Lot Help from another person to put on and taking off regular lower body clothing?: Total 6 Click Score: 13    End of Session Equipment Utilized During Treatment: Gait belt;Rolling walker  OT Visit Diagnosis: Unsteadiness on feet (R26.81);History of falling (Z91.81);Muscle weakness (generalized) (M62.81);Pain Pain - Right/Left: Left Pain - part of body: Shoulder;Arm;Hand   Activity Tolerance Patient limited by pain   Patient Left with call bell/phone within reach;with family/visitor present;with chair alarm set;in chair   Nurse Communication Mobility status        Time: [id number] OT Time Calculation (min): 23 min  Charges: OT General Charges $OT Visit: 1 Visit OT Treatments $Self Care/Home Management : 8-22 mins $Therapeutic Activity: 8-22 mins  Louvina Cleary, OTR/L [place]  Office [phone] Pager: [place] 08/15/2020, 3:16 PM

## 2020-08-15 NOTE — Progress Notes (Addendum)
Orthopedics aware of patient.  Radiographs viewed and discussed patient with Dr. Marlou Sa.  Intra-articular swelling noted on radiographs. Recommend radiologic aspiration of left elbow with cell count, gram stain, crystal analysis, aerobic/anaerobic cultures.  Will evaluate patient in person in near future. Reaching out for consult for aspiration. CT ordered for further evaluation of the acuity of the left elbow radial head fracture

## 2020-08-15 NOTE — Plan of Care (Signed)
Patient up to [facility] multiple times during 7 a to 7 p shift as well as sat up in chair for several hours.  Daughter at bedside for much of shift.

## 2020-08-15 NOTE — Progress Notes (Signed)
PROGRESS NOTE  [name]  [id number] DOB: 1918/03/07 DOA: 08/11/2020 PCP: Marin Olp, MD   Brief Narrative: 86 year old female with history of right temporal intracranial hemorrhage with hypertensive crisis in 2016, hypertension, macular degeneration, meningioma, paroxysmal atrial fibrillation, stable amyloid angiopathy, hyperlipidemia, CKD stage III presented to [facility] long hospital after a fall at home. Patient says she was in dining room yesterday evening getting a cookie for her husband when she suddenly felt stuck in a box and fell to the floor. There was no loss of consciousness. She was brought to ED. CT head showed subarachnoid hemorrhage in the sulcus of the left frontal lobe as well as subdural hemorrhage over the left frontoparietal convexity measuring 4 cm in maximum thickness. Neurosurgery was consulted.  Assessment & Plan: Principal Problem:   Subdural hematoma (HCC) Active Problems:   Hypertensive urgency   Mixed hyperlipidemia   Chronic kidney disease, stage 3a ([place])   Fall at home, initial encounter   Subarachnoid hemorrhage following injury ([place])   SAH (subarachnoid hemorrhage) ([place])  Traumatic subarachnoid hemorrhage: No neurological deficits, no further diagnostic imaging or treatment recommended by neurosurgery.   Mechanical fall at home: SNF recommended for rehabilitation but politely declined by patient and family who have arranged significant assistance at home.  - Continue PT, OT at home. Maximizing home health assistance at discharge with CM assistance. Pt's daughter reports having all the DME they need.  - Supraorbital laceration was repaired with gut sutures by EDP which appears to be well-apposed and should dissolve without need for removal.   HTN: - Continue amlodipine 5 mg, losartan 100 mg daily.   - Continue (new) hydralazine 25 mg p.o. every 6 hours as needed for BP more than 160/100.  History of left wrist fracture, now with acute  left wrist pain: Note left shoulder and elbow plain films have been performed during this hospitalization without acute-appearing changes, though there is considerable degenerative change and corticated radial head fracture suggestive of subacute/remote fracture with small joint effusion.  - D/w orthopedics 12/21, Dr. Lorin Mercy, who recommended left should sling and outpatient follow up. Since patient developed fever and elevation of WBC with ongoing pain in the LUE, orthopedics is formally consulted. CT elbow ordered with plans for diagnostic aspiration. Will continue monitoring off abx for now.  - Tylenol prn, heating pad, diclofenac gel topically.   Acute delirium: Worsened by hospitalization, pain, analgesics.  - Aim to limit duration of hospitalization, encourage frequent visitation by family.   DVT prophylaxis: SCDs Code Status: Full (need to discuss with family further) Family Communication: Daughter by phone this morning  Disposition Plan:  Status is: Inpatient  Remains inpatient appropriate because:Ongoing active pain requiring inpatient pain management and Altered mental status. Unfortunately, this patient's fever and leukocytosis require further work up prior to discharge.  Dispo: The patient is from: Home              Anticipated d/c is to: Home              Anticipated d/c date is: 1 day              Patient currently is not medically stable to d/c.  Consultants:   Orthopedics, neurosurgery  Procedures:   None  Antimicrobials:  None   Subjective: I woke the patient from sleep this morning and she was disoriented. She had no specific complaints.   Objective: Vitals:   08/13/20 2002 08/14/20 0554 08/14/20 1347 08/14/20 2051  BP: 137/86 Marland Kitchen)  149/66 (!) 143/70 (!) 147/70  Pulse: 74 63 79 86  Resp: 18 14 18 19   Temp: 98.4 F (36.9 C) 98.1 F (36.7 C) 98.8 F (37.1 C) (!) 100.5 F (38.1 C)  TempSrc: Oral Oral Oral Oral  SpO2: 94% 94% 94% 93%  Weight:      Height:         Intake/Output Summary (Last 24 hours) at 08/15/2020 1234 Last data filed at 08/15/2020 1008 Gross per 24 hour  Intake 720 ml  Output 850 ml  Net -130 ml   Filed Weights   08/12/20 2011  Weight: 59.6 kg   Gen: Very elderly female in no distress Pulm: Nonlabored breathing room air. Clear. CV: Regular rate and rhythm. No murmur, rub, or gallop. No JVD, no pitting dependent edema. GI: Abdomen soft, non-tender, non-distended, with normoactive bowel sounds.  Ext: Warm, no visible or palpable deformities though joints have bony enlargement. Moving left wrist and elbow without evidence of pain, no warmth or erythema on left elbow or other joints. When palpating legs the patient does grimace significantly regardless of location. No visible or palpable etiology for this at time of exam. Skin: No new rashes, lesions or ulcers on visualized skin. Neuro: Alert and disoriented, moves all extremities. Psych: Judgement and insight appear impaired.   Data Reviewed: I have personally reviewed following labs and imaging studies  CBC: Recent Labs  Lab 08/12/20 0209 08/12/20 0628 08/15/20 0751  WBC 10.0 7.9 11.3*  NEUTROABS  --  5.7 7.8*  HGB 12.2 9.6* 11.7*  HCT 37.3 29.9* 36.0  MCV 89.7 91.2 89.8  PLT 170 174 [id number]   Basic Metabolic Panel: Recent Labs  Lab 08/12/20 0209 08/12/20 0628  NA 141 143  K 3.9 3.5  CL 106 106  CO2 25 25  GLUCOSE 134* 119*  BUN 24* 20  CREATININE 0.86 0.70  CALCIUM 8.6* 8.1*  MG  --  1.9   GFR: Estimated Creatinine Clearance: 30.2 mL/min (by C-G formula based on SCr of 0.7 mg/dL). Liver Function Tests: Recent Labs  Lab 08/12/20 0628  AST 23  ALT 17  ALKPHOS 53  BILITOT 0.8  PROT 6.0*  ALBUMIN 3.3*   No results for input(s): LIPASE, AMYLASE in the last 168 hours. No results for input(s): AMMONIA in the last 168 hours. Coagulation Profile: Recent Labs  Lab 08/12/20 0209  INR 1.1   Cardiac Enzymes: No results for input(s): CKTOTAL, CKMB,  CKMBINDEX, TROPONINI in the last 168 hours. BNP (last 3 results) No results for input(s): PROBNP in the last 8760 hours. HbA1C: No results for input(s): HGBA1C in the last 72 hours. CBG: Recent Labs  Lab 08/12/20 1558  GLUCAP 112*   Lipid Profile: No results for input(s): CHOL, HDL, LDLCALC, TRIG, CHOLHDL, LDLDIRECT in the last 72 hours. Thyroid Function Tests: No results for input(s): TSH, T4TOTAL, FREET4, T3FREE, THYROIDAB in the last 72 hours. Anemia Panel: No results for input(s): VITAMINB12, FOLATE, FERRITIN, TIBC, IRON, RETICCTPCT in the last 72 hours. Urine analysis:    Component Value Date/Time   COLORURINE YELLOW 08/13/2020 0000   APPEARANCEUR CLEAR 08/13/2020 0000   LABSPEC 1.014 08/13/2020 0000   PHURINE 7.0 08/13/2020 0000   GLUCOSEU NEGATIVE 08/13/2020 0000   GLUCOSEU NEGATIVE 04/28/2017 1155   HGBUR NEGATIVE 08/13/2020 0000   BILIRUBINUR NEGATIVE 08/13/2020 0000   BILIRUBINUR neg 09/27/2015 1130   KETONESUR NEGATIVE 08/13/2020 0000   PROTEINUR NEGATIVE 08/13/2020 0000   UROBILINOGEN 0.2 04/28/2017 1155   NITRITE NEGATIVE 08/13/2020  0000   LEUKOCYTESUR NEGATIVE 08/13/2020 0000   Recent Results (from the past 240 hour(s))  Resp Panel by RT-PCR (Flu A&B, Covid) Nasopharyngeal Swab     Status: None   Collection Time: 08/12/20  6:28 AM   Specimen: Nasopharyngeal Swab; Nasopharyngeal(NP) swabs in vial transport medium  Result Value Ref Range Status   SARS Coronavirus 2 by RT PCR NEGATIVE NEGATIVE Final    Comment: (NOTE) SARS-CoV-2 target nucleic acids are NOT DETECTED.  The SARS-CoV-2 RNA is generally detectable in upper respiratory specimens during the acute phase of infection. The lowest concentration of SARS-CoV-2 viral copies this assay can detect is 138 copies/mL. A negative result does not preclude SARS-Cov-2 infection and should not be used as the sole basis for treatment or other patient management decisions. A negative result may occur with   improper specimen collection/handling, submission of specimen other than nasopharyngeal swab, presence of viral mutation(s) within the areas targeted by this assay, and inadequate number of viral copies(<138 copies/mL). A negative result must be combined with clinical observations, patient history, and epidemiological information. The expected result is Negative.  Fact Sheet for Patients:  EntrepreneurPulse.com.au  Fact Sheet for Healthcare Providers:  IncredibleEmployment.be  This test is no t yet approved or cleared by the [place] FDA and  has been authorized for detection and/or diagnosis of SARS-CoV-2 by FDA under an Emergency Use Authorization (EUA). This EUA will remain  in effect (meaning this test can be used) for the duration of the COVID-19 declaration under Section 564(b)(1) of the Act, 21 U.S.C.section 360bbb-3(b)(1), unless the authorization is terminated  or revoked sooner.       Influenza A by PCR NEGATIVE NEGATIVE Final   Influenza B by PCR NEGATIVE NEGATIVE Final    Comment: (NOTE) The Xpert Xpress SARS-CoV-2/FLU/RSV plus assay is intended as an aid in the diagnosis of influenza from Nasopharyngeal swab specimens and should not be used as a sole basis for treatment. Nasal washings and aspirates are unacceptable for Xpert Xpress SARS-CoV-2/FLU/RSV testing.  Fact Sheet for Patients: EntrepreneurPulse.com.au  Fact Sheet for Healthcare Providers: IncredibleEmployment.be  This test is not yet approved or cleared by the [place] FDA and has been authorized for detection and/or diagnosis of SARS-CoV-2 by FDA under an Emergency Use Authorization (EUA). This EUA will remain in effect (meaning this test can be used) for the duration of the COVID-19 declaration under Section 564(b)(1) of the Act, 21 U.S.C. section 360bbb-3(b)(1), unless the authorization is terminated  or revoked.  Performed at [facility], [place] [address] [place], [place] [address]       Radiology Studies: DG Elbow 2 Views Left  Result Date: 08/13/2020 CLINICAL DATA:  Elbow pain EXAM: LEFT ELBOW - 2 VIEW COMPARISON:  08/11/2020 FINDINGS: There are changes consistent with prior fracture of the radial head. The fracture fragment is well corticated and likely of a subacute to chronic nature. Small joint effusion is noted new from the prior exam. No other focal abnormality is noted. IMPRESSION: Changes consistent with prior subacute to chronic fracture in the radial head. Small joint effusion is noted. Electronically Signed   By: Inez Catalina M.D.   On: 08/13/2020 13:24   DG Wrist 2 Views Left  Result Date: 08/14/2020 CLINICAL DATA:  One 86 year old female with left wrist pain. EXAM: LEFT WRIST - 2 VIEW COMPARISON:  None. FINDINGS: Old-appearing fracture of the distal radial metaphysis. Clinical correlation is recommended. No definite acute fracture. The bones are osteopenic. There  is no dislocation. Degenerative changes of the wrist as well as osteoarthritic changes of the base of the thumb. There is positive ulnar variance. The soft tissues are unremarkable. IMPRESSION: 1. No definite acute fracture or dislocation. 2. Old-appearing fracture of the distal radial metaphysis. Electronically Signed   By: Anner Crete M.D.   On: 08/14/2020 19:01    Scheduled Meds: . amLODipine  10 mg Oral Daily  . atorvastatin  40 mg Oral QHS  . losartan  100 mg Oral QHS   Continuous Infusions:   LOS: 2 days   Time spent: 25 minutes.  Patrecia Pour, MD [facility] [email or web address] 08/15/2020, 12:34 PM

## 2020-08-15 NOTE — Progress Notes (Signed)
CT scan reviewed and radial head fracture appears more acute than suggested by left elbow radiographs.  Effusion likely from acute fracture, plan for sling immobilization with outpatient follow-up with Dr. Lorin Mercy in clinic for further evaluation. Patient discussed with Dr. Marlou Sa. No need for aspiration at this time.

## 2020-08-15 NOTE — Plan of Care (Signed)
Patient has good pulmonary toilet

## 2020-08-16 DIAGNOSIS — Z8673 Personal history of transient ischemic attack (TIA), and cerebral infarction without residual deficits: Secondary | ICD-10-CM | POA: Diagnosis not present

## 2020-08-16 DIAGNOSIS — S62102A Fracture of unspecified carpal bone, left wrist, initial encounter for closed fracture: Secondary | ICD-10-CM

## 2020-08-16 DIAGNOSIS — Z79899 Other long term (current) drug therapy: Secondary | ICD-10-CM | POA: Diagnosis not present

## 2020-08-16 DIAGNOSIS — I10 Essential (primary) hypertension: Secondary | ICD-10-CM | POA: Diagnosis not present

## 2020-08-16 DIAGNOSIS — S065X0A Traumatic subdural hemorrhage without loss of consciousness, initial encounter: Secondary | ICD-10-CM | POA: Diagnosis not present

## 2020-08-16 DIAGNOSIS — W19XXXA Unspecified fall, initial encounter: Secondary | ICD-10-CM | POA: Diagnosis not present

## 2020-08-16 DIAGNOSIS — I4891 Unspecified atrial fibrillation: Secondary | ICD-10-CM | POA: Diagnosis not present

## 2020-08-16 DIAGNOSIS — S065X9A Traumatic subdural hemorrhage with loss of consciousness of unspecified duration, initial encounter: Secondary | ICD-10-CM | POA: Diagnosis not present

## 2020-08-16 DIAGNOSIS — R410 Disorientation, unspecified: Secondary | ICD-10-CM | POA: Diagnosis not present

## 2020-08-16 DIAGNOSIS — I6200 Nontraumatic subdural hemorrhage, unspecified: Secondary | ICD-10-CM | POA: Diagnosis not present

## 2020-08-16 DIAGNOSIS — I482 Chronic atrial fibrillation, unspecified: Secondary | ICD-10-CM | POA: Diagnosis not present

## 2020-08-16 DIAGNOSIS — I609 Nontraumatic subarachnoid hemorrhage, unspecified: Secondary | ICD-10-CM | POA: Diagnosis not present

## 2020-08-16 LAB — BASIC METABOLIC PANEL
Anion gap: 10 (ref 5–15)
BUN: 33 mg/dL — ABNORMAL HIGH (ref 8–23)
CO2: 26 mmol/L (ref 22–32)
Calcium: 8.4 mg/dL — ABNORMAL LOW (ref 8.9–10.3)
Chloride: 102 mmol/L (ref 98–111)
Creatinine, Ser: 1.18 mg/dL — ABNORMAL HIGH (ref 0.44–1.00)
GFR, Estimated: 41 mL/min — ABNORMAL LOW (ref >60)
Glucose, Bld: 126 mg/dL — ABNORMAL HIGH (ref 70–99)
Potassium: 4.4 mmol/L (ref 3.5–5.1)
Sodium: 138 mmol/L (ref 135–145)

## 2020-08-16 LAB — CBC WITH DIFFERENTIAL/PLATELET
Abs Immature Granulocytes: 0.05 10*3/uL (ref 0.00–0.07)
Basophils Absolute: 0 10*3/uL (ref 0.0–0.1)
Basophils Relative: 0 %
Eosinophils Absolute: 0.1 10*3/uL (ref 0.0–0.5)
Eosinophils Relative: 1 %
HCT: 36.1 % (ref 36.0–46.0)
Hemoglobin: 11.6 g/dL — ABNORMAL LOW (ref 12.0–15.0)
Immature Granulocytes: 0 %
Lymphocytes Relative: 9 %
Lymphs Abs: 1.1 10*3/uL (ref 0.7–4.0)
MCH: 29 pg (ref 26.0–34.0)
MCHC: 32.1 g/dL (ref 30.0–36.0)
MCV: 90.3 fL (ref 80.0–100.0)
Monocytes Absolute: 1.6 10*3/uL — ABNORMAL HIGH (ref 0.1–1.0)
Monocytes Relative: 13 %
Neutro Abs: 9.6 10*3/uL — ABNORMAL HIGH (ref 1.7–7.7)
Neutrophils Relative %: 77 %
Platelets: 159 10*3/uL (ref 150–400)
RBC: 4 MIL/uL (ref 3.87–5.11)
RDW: 14.5 % (ref 11.5–15.5)
WBC: 12.5 10*3/uL — ABNORMAL HIGH (ref 4.0–10.5)
nRBC: 0 % (ref 0.0–0.2)

## 2020-08-16 NOTE — Discharge Summary (Signed)
Physician Discharge Summary  [name] [id number] DOB: October 16, 1917 DOA: 08/11/2020  PCP: Marin Olp, MD  Admit date: 08/11/2020 Discharge date: 08/16/2020  Admitted From: Home Disposition: Home  Recommendations for Outpatient Follow-up:  1. Follow up with PCP in 1 week with repeat CBC/BMP 2. Outpatient follow-up with orthopedics 3. Follow up in ED if symptoms worsen or new appear   Home Health: PT/OT/RN  equipment/Devices: None  Discharge Condition: Guarded CODE STATUS: Full Diet recommendation: Heart healthy  Brief/Interim Summary: 86 year old female with history of right temporal intracranial hemorrhage with hypertensive crisis in 2016, hypertension, macular degeneration, meningioma, paroxysmal atrial fibrillation, stable amyloid angiopathy, hyperlipidemia, CKD stage III presented to [facility] after a fall at home with no loss of consciousness.  On presentation, CT head showed subarachnoid hemorrhage in the sulcus of the left frontal lobe as well as subdural hemorrhage over the left frontoparietal convexity measuring 4 cm in maximum thickness. Neurosurgery was consulted who recommended conservative management.  She was also found to have left wrist fracture for which orthopedics recommended sling and outpatient follow-up.  She is currently medically stable for discharge and will be discharged with home health PT/OT/RN.  Discharge Diagnoses:   Traumatic subarachnoid hemorrhage: No neurological deficits, no further diagnostic imaging or treatment recommended by neurosurgery.  No outpatient follow-up needed as per neurosurgery unless there is mental status changes.  Mechanical fall at home: SNF recommended for rehabilitation but politely declined by patient and family who have arranged significant assistance at home.  - Supraorbital laceration was repaired with gut sutures by EDP which appears to be well-apposed and should dissolve without need for removal.   - will need home health PT/OT  Hypertension -Continue amlodipine, losartan.  Blood pressure stable  Left wrist/radial head fracture -Evaluated by orthopedics who recommended conservative treatment with sling immobilization with outpatient follow-up with Dr. Lorin Mercy  Acute delirium -Probably worsened by hospitalization, pain, analgesics.  Mental status improved. -Use tramadol only for severe pain at home  Leukocytosis -Possibly reactive    Discharge Instructions  Discharge Instructions    Diet - low sodium heart healthy   Complete by: As directed    Discharge instructions   Complete by: As directed    You were admitted for observation after noting a small bleeding area in the brain which has stabilized without further bleeding. Neurosurgery recommends against any further treatment or imaging. You will need to avoid aspirin and all blood thinners as you were previously doing. You may take tylenol as need for pain. If severe and uncontrolled, you can try tramadol (no more than 2 times in a day) as well.   Increase activity slowly   Complete by: As directed      Allergies as of 08/16/2020      Reactions   Ace Inhibitors    REACTION: angioedema      Medication List    TAKE these medications   acetaminophen 500 MG tablet Commonly known as: TYLENOL Take 1-2 tablets (500-1,000 mg total) by mouth every 6 (six) hours as needed for mild pain or moderate pain. What changed:   how much to take  reasons to take this   amLODipine 5 MG tablet Commonly known as: NORVASC TAKE 1 TABLET BY MOUTH  DAILY Notes to patient: 08/17/2020    atorvastatin 40 MG tablet Commonly known as: LIPITOR TAKE 1 TABLET BY MOUTH AT  BEDTIME Notes to patient: 08/16/2020    losartan 100 MG tablet Commonly known as: COZAAR TAKE 1 TABLET  BY MOUTH  DAILY What changed: when to take this Notes to patient: 08/16/2020    multivitamin tablet Take 1 tablet by mouth daily. Notes to patient: 08/17/2020     traMADol 50 MG tablet Commonly known as: ULTRAM Take 1 tablet (50 mg total) by mouth every 12 (twelve) hours as needed for severe pain. Notes to patient: Had at 0830 a.m.        Follow-up Information    Marin Olp, MD Follow up.   Specialty: Family Medicine Contact information: [address] [place] [address] [phone]        [facility] Follow up.   Specialty: [place] Why: [facility] will follow you at discharge starting 12/23 with Home Health. Please call the aboe number if you have any problems.  Contact information: [place] [phone]              Allergies  Allergen Reactions  . Ace Inhibitors     REACTION: angioedema    Consultations:  Neurosurgery/orthopedics   Procedures/Studies: DG Chest 1 View  Result Date: 08/12/2020 CLINICAL DATA:  Pneumonia EXAM: CHEST  1 VIEW COMPARISON:  03/28/2015 FINDINGS: Borderline heart size, stable. Mitral annular calcification. Artifact from EKG leads. There is no edema, convincing consolidation, effusion, or pneumothorax. IMPRESSION: No convincing pneumonia. Electronically Signed   By: Monte Fantasia M.D.   On: 08/12/2020 07:41   DG Elbow 2 Views Left  Result Date: 08/13/2020 CLINICAL DATA:  Elbow pain EXAM: LEFT ELBOW - 2 VIEW COMPARISON:  08/11/2020 FINDINGS: There are changes consistent with prior fracture of the radial head. The fracture fragment is well corticated and likely of a subacute to chronic nature. Small joint effusion is noted new from the prior exam. No other focal abnormality is noted. IMPRESSION: Changes consistent with prior subacute to chronic fracture in the radial head. Small joint effusion is noted. Electronically Signed   By: Inez Catalina M.D.   On: 08/13/2020 13:24   DG Wrist 2 Views Left  Result Date: 08/14/2020 CLINICAL DATA:  One 86 year old female with left wrist pain. EXAM: LEFT WRIST - 2 VIEW COMPARISON:   None. FINDINGS: Old-appearing fracture of the distal radial metaphysis. Clinical correlation is recommended. No definite acute fracture. The bones are osteopenic. There is no dislocation. Degenerative changes of the wrist as well as osteoarthritic changes of the base of the thumb. There is positive ulnar variance. The soft tissues are unremarkable. IMPRESSION: 1. No definite acute fracture or dislocation. 2. Old-appearing fracture of the distal radial metaphysis. Electronically Signed   By: Anner Crete M.D.   On: 08/14/2020 19:01   CT HEAD WO CONTRAST  Result Date: 08/12/2020 CLINICAL DATA:  Subdural hematoma. EXAM: CT HEAD WITHOUT CONTRAST TECHNIQUE: Contiguous axial images were obtained from the base of the skull through the vertex without intravenous contrast. COMPARISON:  Head CT performed earlier today 08/12/2020. FINDINGS: Brain: Moderate generalized parenchymal atrophy. Stable small volume acute subarachnoid hemorrhage scattered along the left cerebral hemisphere. Stable left hemispheric subdural hematoma greatest along the left frontoparietal and temporal lobes measuring up to 4 mm in greatest thickness. Minimal mass effect upon the underlying left cerebral hemisphere. No midline shift. Stable thin acute subdural hemorrhage along the left tentorium, measuring 3 mm in greatest thickness. Redemonstrated 12 mm dural-based mass along the right aspect of the anterior falx compatible with meningioma. Redemonstrated chronic right temporal lobe encephalomalacia with ex vacuo dilatation of the right temporal horn. Unchanged  subtle chronic gyriform hyperdensity within the right occipital lobe which may reflect cortical laminar necrosis. Ill-defined hypoattenuation within the cerebral white matter is nonspecific, but compatible with chronic small vessel ischemic disease. No acute demarcated cortical infarction Vascular: No hyperdense vessel.  Atherosclerotic calcifications. Skull: No calvarial fracture.  Nonspecific 2.1 cm lucent expansile lesion within the glabellar process of the frontal bone. Sinuses/Orbits: Visualized orbits show no acute finding. Mild ethmoid sinus mucosal thickening. IMPRESSION: Stable examination as compared to the head CT performed earlier today at 12:01 a.m. Small-volume acute subarachnoid hemorrhage along the left frontal lobe. Acute left subdural hemorrhage greatest along the left frontoparietal and temporal lobes, as well as left tentorium (measuring up to 4 mm). No midline shift. 12 mm right anterior parafalcine meningioma. Chronic right temporal lobe encephalomalacia. Generalized atrophy and chronic small vessel ischemic disease. Electronically Signed   By: Kellie Simmering DO   On: 08/12/2020 07:49   CT HEAD WO CONTRAST  Result Date: 08/12/2020 CLINICAL DATA:  Fall, left supraorbital laceration EXAM: CT HEAD WITHOUT CONTRAST CT MAXILLOFACIAL WITHOUT CONTRAST CT CERVICAL SPINE WITHOUT CONTRAST TECHNIQUE: Multidetector CT imaging of the head, cervical spine, and maxillofacial structures were performed using the standard protocol without intravenous contrast. Multiplanar CT image reconstructions of the cervical spine and maxillofacial structures were also generated. COMPARISON:  MR cervical spine 06/23/2010, CT head 01/31/2020, MR/MRA head 03/20/2015 FINDINGS: CT HEAD FINDINGS Brain: Hyperdense subarachnoid hemorrhage within the sulci of the left frontal lobe (3/16). Subdural collection over the left frontoparietal convexity measuring up to 4 mm in maximal thickness (3/14). Trace subdural hemorrhage layering along the left tentorium up to 3 mm in maximal thickness (6/36). No other concerning sites of hemorrhage. Cortical hyperattenuation along the mesial aspect of the right occipital lobe is present on comparison and may reflect sequela of prior vascular insult. This is in addition to a stable right temporal region of encephalomalacia with ex vacuo dilatation of the right temporal horn.  Stable right para falcine lesion measuring 5 x 11 mm, likely a small meningioma with small dural tails. Additional features of moderate diffuse parenchymal volume loss. Patchy areas of white matter hypoattenuation are most compatible with chronic microvascular angiopathy. No midline shift or transtentorial herniation. No CT evidence of large territory infarct. Basal cisterns are patent. Vascular: Atherosclerotic calcification of the carotid siphons and intradural vertebral arteries. No hyperdense vessel. Skull: Left frontal and supraorbital scalp swelling with laceration extending into the periorbital soft tissues, as below. No visible calvarial fracture. No other significant sites of scalp contusion or hematoma. Other: None. CT MAXILLOFACIAL FINDINGS Osseous: The osseous structures appear diffusely demineralized which may limit detection of small or nondisplaced fractures. No fracture of the bony orbits. Nasal bones are intact. There is a ill-defined, slightly expansile lesion measuring approximately 9 x 10 x31 mm (4/19, 10/39) centered in the anteroinferior glabellar process of the frontal bone. Comparison to prior MR and CT imaging reveals a similar appearance on prior studies. No other mid face fractures are seen. The pterygoid plates are intact. No visible or suspected temporal bone fractures. Temporomandibular joints are normally aligned. The mandible is intact. No fractured or avulsed teeth. Scattered carious lesions and few apical lucencies of the maxillary and mandibular dentition. Orbits: Left supraorbital/periorbital soft tissue swelling, with laceration and few punctate foci of soft tissue gas confined to the preseptal soft tissues. No retro septal gas, stranding or hemorrhage. The globes appear grossly normal and symmetric with prior bilateral lens extractions. Likely benign scleral calcification on the right.  Symmetric appearance of the extraocular musculature and optic nerve sheath complexes. Normal  caliber of the superior ophthalmic veins. Sinuses: Paranasal sinuses and mastoid air cells are predominantly clear. Middle ear cavities are clear. Debris in the bilateral external auditory canals. Ossicular chains are normally configured. Soft tissues: Left supraorbital/periorbital soft tissue swelling and laceration. No other significant soft tissue findings, gas or foreign body. CT CERVICAL SPINE FINDINGS Alignment: Stabilization collar absent. Mild rightward lateral flexion and cranial rotation. Straightening of the normal cervical lordosis with some focal reversal centered at the C4 level. Stepwise anterolisthesis C2-C4 is favored to be on a spondylitic basis. Additional anterolisthesis C7 on T1 also likely degenerative and not significantly changed comparison Skull base and vertebrae: The osseous structures appear diffusely demineralized which may limit detection of small or nondisplaced fractures. No acute skull base fracture. No vertebral body fracture or height loss. Progressive spondylitic fusion across the C4-5 levels. Multilevel spondylitic changes throughout the cervical spine as detailed below. Additional moderate to severe arthrosis at the atlantodental and basion dens intervals. No suspicious lytic or blastic lesions in the cervical spine. Soft tissues and spinal canal: No pre or paravertebral fluid or swelling. No visible canal hematoma. Disc levels: Multilevel intervertebral disc height loss with spondylitic endplate changes., larger disc osteophyte complexes and posterior osseous ridging is pronounced at C3-4, C4-5, C5-6 and C6-7 resulting in mild-to-moderate canal stenosis at these levels. Additional diffuse uncinate spurring and facet hypertrophic changes result in mild-to-moderate multilevel neural foraminal narrowing with more moderate to severe foraminal narrowing on the left at C3-4, C4-5. Upper chest: Biapical pleuroparenchymal scarring. Calcification of the proximal great vessels and  cervical carotids, particular at the bifurcations. Normal thyroid. Other: None. IMPRESSION: 1. Hyperdense subarachnoid hemorrhage within the sulci of the left frontal lobe. Subdural hemorrhage over the left frontoparietal convexity measuring up to 4 mm in maximal thickness. Trace subdural hemorrhage layering along the left tentorium up to 3 mm in maximal thickness. 2. Left frontal and supraorbital scalp swelling and laceration extending. No visible calvarial or acute facial bone fracture. 3. Encephalomalacia of the right occipital lobe and right temporal lobe, unchanged from prior. 4. Stable 11 mm right parafalcine lesion, likely a small meningioma. 5. Indeterminate though stable expansile lesion centered in the anterior glabellar process of the frontal bone. 6. No evidence of acute fracture or traumatic listhesis of the cervical spine. 7. Multilevel spondylitic changes throughout the cervical spine, as described. 8. Progressive spondylitic fusion across the C4-5 levels. 9. Scattered carious lesions and few apical lucencies of the maxillary and mandibular dentition. Correlate with dental exam. 10. Cervical and intracranial atherosclerosis. Critical Value/emergent results were called by telephone at the time of interpretation on 08/12/2020 at 1:07 am to provider [facility] , who verbally acknowledged these results. Electronically Signed   By: Lovena Le M.D.   On: 08/12/2020 01:07   CT CERVICAL SPINE WO CONTRAST  Result Date: 08/12/2020 CLINICAL DATA:  Fall, left supraorbital laceration EXAM: CT HEAD WITHOUT CONTRAST CT MAXILLOFACIAL WITHOUT CONTRAST CT CERVICAL SPINE WITHOUT CONTRAST TECHNIQUE: Multidetector CT imaging of the head, cervical spine, and maxillofacial structures were performed using the standard protocol without intravenous contrast. Multiplanar CT image reconstructions of the cervical spine and maxillofacial structures were also generated. COMPARISON:  MR cervical spine 06/23/2010, CT head  01/31/2020, MR/MRA head 03/20/2015 FINDINGS: CT HEAD FINDINGS Brain: Hyperdense subarachnoid hemorrhage within the sulci of the left frontal lobe (3/16). Subdural collection over the left frontoparietal convexity measuring up to 4 mm in maximal thickness (  3/14). Trace subdural hemorrhage layering along the left tentorium up to 3 mm in maximal thickness (6/36). No other concerning sites of hemorrhage. Cortical hyperattenuation along the mesial aspect of the right occipital lobe is present on comparison and may reflect sequela of prior vascular insult. This is in addition to a stable right temporal region of encephalomalacia with ex vacuo dilatation of the right temporal horn. Stable right para falcine lesion measuring 5 x 11 mm, likely a small meningioma with small dural tails. Additional features of moderate diffuse parenchymal volume loss. Patchy areas of white matter hypoattenuation are most compatible with chronic microvascular angiopathy. No midline shift or transtentorial herniation. No CT evidence of large territory infarct. Basal cisterns are patent. Vascular: Atherosclerotic calcification of the carotid siphons and intradural vertebral arteries. No hyperdense vessel. Skull: Left frontal and supraorbital scalp swelling with laceration extending into the periorbital soft tissues, as below. No visible calvarial fracture. No other significant sites of scalp contusion or hematoma. Other: None. CT MAXILLOFACIAL FINDINGS Osseous: The osseous structures appear diffusely demineralized which may limit detection of small or nondisplaced fractures. No fracture of the bony orbits. Nasal bones are intact. There is a ill-defined, slightly expansile lesion measuring approximately 9 x 10 x31 mm (4/19, 10/39) centered in the anteroinferior glabellar process of the frontal bone. Comparison to prior MR and CT imaging reveals a similar appearance on prior studies. No other mid face fractures are seen. The pterygoid plates are  intact. No visible or suspected temporal bone fractures. Temporomandibular joints are normally aligned. The mandible is intact. No fractured or avulsed teeth. Scattered carious lesions and few apical lucencies of the maxillary and mandibular dentition. Orbits: Left supraorbital/periorbital soft tissue swelling, with laceration and few punctate foci of soft tissue gas confined to the preseptal soft tissues. No retro septal gas, stranding or hemorrhage. The globes appear grossly normal and symmetric with prior bilateral lens extractions. Likely benign scleral calcification on the right. Symmetric appearance of the extraocular musculature and optic nerve sheath complexes. Normal caliber of the superior ophthalmic veins. Sinuses: Paranasal sinuses and mastoid air cells are predominantly clear. Middle ear cavities are clear. Debris in the bilateral external auditory canals. Ossicular chains are normally configured. Soft tissues: Left supraorbital/periorbital soft tissue swelling and laceration. No other significant soft tissue findings, gas or foreign body. CT CERVICAL SPINE FINDINGS Alignment: Stabilization collar absent. Mild rightward lateral flexion and cranial rotation. Straightening of the normal cervical lordosis with some focal reversal centered at the C4 level. Stepwise anterolisthesis C2-C4 is favored to be on a spondylitic basis. Additional anterolisthesis C7 on T1 also likely degenerative and not significantly changed comparison Skull base and vertebrae: The osseous structures appear diffusely demineralized which may limit detection of small or nondisplaced fractures. No acute skull base fracture. No vertebral body fracture or height loss. Progressive spondylitic fusion across the C4-5 levels. Multilevel spondylitic changes throughout the cervical spine as detailed below. Additional moderate to severe arthrosis at the atlantodental and basion dens intervals. No suspicious lytic or blastic lesions in the  cervical spine. Soft tissues and spinal canal: No pre or paravertebral fluid or swelling. No visible canal hematoma. Disc levels: Multilevel intervertebral disc height loss with spondylitic endplate changes., larger disc osteophyte complexes and posterior osseous ridging is pronounced at C3-4, C4-5, C5-6 and C6-7 resulting in mild-to-moderate canal stenosis at these levels. Additional diffuse uncinate spurring and facet hypertrophic changes result in mild-to-moderate multilevel neural foraminal narrowing with more moderate to severe foraminal narrowing on the left at C3-4,  C4-5. Upper chest: Biapical pleuroparenchymal scarring. Calcification of the proximal great vessels and cervical carotids, particular at the bifurcations. Normal thyroid. Other: None. IMPRESSION: 1. Hyperdense subarachnoid hemorrhage within the sulci of the left frontal lobe. Subdural hemorrhage over the left frontoparietal convexity measuring up to 4 mm in maximal thickness. Trace subdural hemorrhage layering along the left tentorium up to 3 mm in maximal thickness. 2. Left frontal and supraorbital scalp swelling and laceration extending. No visible calvarial or acute facial bone fracture. 3. Encephalomalacia of the right occipital lobe and right temporal lobe, unchanged from prior. 4. Stable 11 mm right parafalcine lesion, likely a small meningioma. 5. Indeterminate though stable expansile lesion centered in the anterior glabellar process of the frontal bone. 6. No evidence of acute fracture or traumatic listhesis of the cervical spine. 7. Multilevel spondylitic changes throughout the cervical spine, as described. 8. Progressive spondylitic fusion across the C4-5 levels. 9. Scattered carious lesions and few apical lucencies of the maxillary and mandibular dentition. Correlate with dental exam. 10. Cervical and intracranial atherosclerosis. Critical Value/emergent results were called by telephone at the time of interpretation on 08/12/2020 at  1:07 am to provider [facility] , who verbally acknowledged these results. Electronically Signed   By: Lovena Le M.D.   On: 08/12/2020 01:07   CT ELBOW LEFT WO CONTRAST  Result Date: 08/15/2020 CLINICAL DATA:  Elbow pain and swelling after recent fall. Subdural hematoma. EXAM: CT OF THE UPPER LEFT EXTREMITY WITHOUT CONTRAST TECHNIQUE: Multidetector CT imaging of the left elbow was performed according to the standard protocol. COMPARISON:  Left elbow radiographs 08/13/2020 FINDINGS: Bones/Joint/Cartilage There is deformity of the radial head and neck which is greatest anteriorly and laterally, probably reflecting a mildly displaced acute fracture. There is no depression of the articular surface. The radiocapitellar articulation is intact. The distal humerus and proximal ulna are intact. There is mild spurring of coronoid and olecranon processes. Small elbow joint effusion demonstrates intermediate density, likely reflecting hemarthrosis related to acute fracture. No erosive changes or bony destruction. Ligaments Suboptimally assessed by CT. Muscles and Tendons The biceps and triceps tendons appear intact. No focal muscular abnormalities are identified. Soft tissues Dorsal soft tissue swelling without evidence of focal fluid collection, soft tissue emphysema or foreign body. No skin ulceration identified. IMPRESSION: 1. Mildly displaced fracture of the radial head and neck, likely acute. No depression of the articular surface. 2. Associated small left elbow joint hemarthrosis. 3. Dorsal soft tissue swelling without evidence of focal fluid collection, soft tissue emphysema or foreign body. Electronically Signed   By: Richardean Sale M.D.   On: 08/15/2020 14:22   DG Shoulder Left  Result Date: 08/12/2020 CLINICAL DATA:  Arm pain after fall. EXAM: LEFT SHOULDER - 2+ VIEW COMPARISON:  Left humerus 08/11/2020. FINDINGS: Acromioclavicular glenohumeral degenerative change. Subacromial spurring. No acute bony or  joint abnormality. No evidence of fracture dislocation. IMPRESSION: Acromioclavicular glenohumeral degenerative change. Subacromial spurring. No acute abnormality identified. Electronically Signed   By: Marcello Moores  Register   On: 08/12/2020 05:58   DG Humerus Left  Result Date: 08/11/2020 CLINICAL DATA:  Left arm pain after fall EXAM: LEFT HUMERUS - 2+ VIEW COMPARISON:  06/16/2010 FINDINGS: Frontal and lateral views of the left humerus are obtained. There are no acute fractures. Moderate osteoarthritis of the left elbow. Soft tissues are unremarkable. IMPRESSION: 1. No acute displaced fracture. 2. Osteoarthritis of the left elbow. Electronically Signed   By: Randa Ngo M.D.   On: 08/11/2020 23:19   ECHOCARDIOGRAM COMPLETE  Result Date: 08/12/2020    ECHOCARDIOGRAM REPORT   Patient Name:   DONALENE SALLEE Kinsel Date of Exam: 08/12/2020 Medical Rec #:  [id number]       Height:       60.0 in Accession #:    [id number]      Weight:       128.0 lb Date of Birth:  [birth year]       BSA:          1.544 m Patient Age:    89 years       BP:           120/57 mmHg Patient Gender: F               HR:           44 bpm. Exam Location:  Inpatient Procedure: 2D Echo, Cardiac Doppler and Color Doppler Indications:    Syncope 780.2 / R55  History:        Patient has prior history of Echocardiogram examinations, most                 recent 03/26/2015. Stroke, Arrythmias:Atrial Fibrillation; Risk                 Factors:Hypertension and Dyslipidemia.  Sonographer:    Data processing manager Referring Phys: [id number] Deno Lunger [place] IMPRESSIONS  1. Left ventricular ejection fraction, by estimation, is 55 to 60%. The left ventricle has normal function. The left ventricle has no regional wall motion abnormalities. Left ventricular diastolic function could not be evaluated.  2. Right ventricular systolic function is normal. The right ventricular size is normal. There is normal pulmonary artery systolic pressure. The estimated right  ventricular systolic pressure is 29.4 mmHg.  3. Left atrial size was severely dilated.  4. Right atrial size was moderately dilated.  5. The mitral valve is abnormal. Mild mitral valve regurgitation.  6. The aortic valve is tricuspid. Aortic valve regurgitation is not visualized. Mild aortic valve sclerosis is present, with no evidence of aortic valve stenosis.  7. The inferior vena cava is normal in size with greater than 50% respiratory variability, suggesting right atrial pressure of 3 mmHg.  8. Cannot exclude small PFO. FINDINGS  Left Ventricle: Left ventricular ejection fraction, by estimation, is 55 to 60%. The left ventricle has normal function. The left ventricle has no regional wall motion abnormalities. The left ventricular internal cavity size was normal in size. There is  no left ventricular hypertrophy. Left ventricular diastolic function could not be evaluated due to atrial fibrillation. Left ventricular diastolic function could not be evaluated. Right Ventricle: The right ventricular size is normal. No increase in right ventricular wall thickness. Right ventricular systolic function is normal. There is normal pulmonary artery systolic pressure. The tricuspid regurgitant velocity is 2.57 m/s, and  with an assumed right atrial pressure of 3 mmHg, the estimated right ventricular systolic pressure is 29.4 mmHg. Left Atrium: Left atrial size was severely dilated. Right Atrium: Right atrial size was moderately dilated. Pericardium: There is no evidence of pericardial effusion. Mitral Valve: The mitral valve is abnormal. There is mild thickening of the mitral valve leaflet(s). Mild to moderate mitral annular calcification. Mild mitral valve regurgitation. Tricuspid Valve: The tricuspid valve is grossly normal. Tricuspid valve regurgitation is mild. Aortic Valve: The aortic valve is tricuspid. Aortic valve regurgitation is not visualized. Mild aortic valve sclerosis is present, with no evidence of aortic  valve stenosis. Pulmonic Valve: The pulmonic valve was normal in structure.  Pulmonic valve regurgitation is not visualized. Aorta: The aortic root and ascending aorta are structurally normal, with no evidence of dilitation. Venous: The inferior vena cava is normal in size with greater than 50% respiratory variability, suggesting right atrial pressure of 3 mmHg. IAS/Shunts: Cannot exclude small PFO.  LEFT VENTRICLE PLAX 2D LVIDd:         3.50 cm LVIDs:         2.50 cm LV PW:         0.90 cm LV IVS:        0.90 cm LVOT diam:     1.80 cm LV SV:         51 LV SV Index:   33 LVOT Area:     2.54 cm  LV Volumes (MOD) LV vol d, MOD A2C: 68.5 ml LV vol d, MOD A4C: 65.5 ml LV vol s, MOD A2C: 26.9 ml LV vol s, MOD A4C: 27.7 ml LV SV MOD A2C:     41.6 ml LV SV MOD A4C:     65.5 ml LV SV MOD BP:      41.6 ml RIGHT VENTRICLE TAPSE (M-mode): 1.8 cm LEFT ATRIUM             Index       RIGHT ATRIUM           Index LA diam:        4.80 cm 3.11 cm/m  RA Area:     19.80 cm LA Vol (A2C):   74.6 ml 48.31 ml/m RA Volume:   60.10 ml  38.92 ml/m LA Vol (A4C):   85.1 ml 55.11 ml/m LA Biplane Vol: 82.4 ml 53.36 ml/m  AORTIC VALVE LVOT Vmax:   70.90 cm/s LVOT Vmean:  53.200 cm/s LVOT VTI:    0.201 m  AORTA Ao Root diam: 3.50 cm TRICUSPID VALVE TR Peak grad:   26.4 mmHg TR Vmax:        257.00 cm/s  SHUNTS Systemic VTI:  0.20 m Systemic Diam: 1.80 cm Lyman Bishop MD Electronically signed by Lyman Bishop MD Signature Date/Time: 08/12/2020/11:44:13 AM    Final    Intravitreal Injection, Pharmacologic Agent - OS - Left Eye  Result Date: 08/01/2020 Time Out 08/01/2020. 3:30 PM. Confirmed correct patient, procedure, site, and patient consented. Anesthesia Topical anesthesia was used. Anesthetic medications included Akten 3.5%. Procedure Preparation included Tobramycin 0.3%, 10% betadine to eyelids, 5% betadine to ocular surface. A supplied needle was used. Injection: 2.5 mg Bevacizumab (AVASTIN) 2.5mg /0.25mL SOSY   NDC: [id number], [id number]   Route: Intravitreal, Site: Left Eye Post-op Post injection exam found visual acuity of at least counting fingers. The patient tolerated the procedure well. There were no complications. The patient received written and verbal post procedure care education. Post injection medications were not given.   OCT, Retina - OU - Both Eyes  Result Date: 08/01/2020 Right Eye Quality was good. Scan locations included subfoveal. Central Foveal Thickness: 248. Findings include normal observations, retinal drusen , no SRF, no IRF. Left Eye Quality was good. Scan locations included subfoveal. Central Foveal Thickness: 328. Findings include pigment epithelial detachment. Notes Much less CME much less intraretinal fluid left eye status post recent injection intravitreal Avastin some 5 weeks previous  CT MAXILLOFACIAL WO CONTRAST  Result Date: 08/12/2020 CLINICAL DATA:  Fall, left supraorbital laceration EXAM: CT HEAD WITHOUT CONTRAST CT MAXILLOFACIAL WITHOUT CONTRAST CT CERVICAL SPINE WITHOUT CONTRAST TECHNIQUE: Multidetector CT imaging of the head, cervical spine, and maxillofacial structures  were performed using the standard protocol without intravenous contrast. Multiplanar CT image reconstructions of the cervical spine and maxillofacial structures were also generated. COMPARISON:  MR cervical spine 06/23/2010, CT head 01/31/2020, MR/MRA head 03/20/2015 FINDINGS: CT HEAD FINDINGS Brain: Hyperdense subarachnoid hemorrhage within the sulci of the left frontal lobe (3/16). Subdural collection over the left frontoparietal convexity measuring up to 4 mm in maximal thickness (3/14). Trace subdural hemorrhage layering along the left tentorium up to 3 mm in maximal thickness (6/36). No other concerning sites of hemorrhage. Cortical hyperattenuation along the mesial aspect of the right occipital lobe is present on comparison and may reflect sequela of prior vascular insult. This is in addition to a stable right temporal  region of encephalomalacia with ex vacuo dilatation of the right temporal horn. Stable right para falcine lesion measuring 5 x 11 mm, likely a small meningioma with small dural tails. Additional features of moderate diffuse parenchymal volume loss. Patchy areas of white matter hypoattenuation are most compatible with chronic microvascular angiopathy. No midline shift or transtentorial herniation. No CT evidence of large territory infarct. Basal cisterns are patent. Vascular: Atherosclerotic calcification of the carotid siphons and intradural vertebral arteries. No hyperdense vessel. Skull: Left frontal and supraorbital scalp swelling with laceration extending into the periorbital soft tissues, as below. No visible calvarial fracture. No other significant sites of scalp contusion or hematoma. Other: None. CT MAXILLOFACIAL FINDINGS Osseous: The osseous structures appear diffusely demineralized which may limit detection of small or nondisplaced fractures. No fracture of the bony orbits. Nasal bones are intact. There is a ill-defined, slightly expansile lesion measuring approximately 9 x 10 x31 mm (4/19, 10/39) centered in the anteroinferior glabellar process of the frontal bone. Comparison to prior MR and CT imaging reveals a similar appearance on prior studies. No other mid face fractures are seen. The pterygoid plates are intact. No visible or suspected temporal bone fractures. Temporomandibular joints are normally aligned. The mandible is intact. No fractured or avulsed teeth. Scattered carious lesions and few apical lucencies of the maxillary and mandibular dentition. Orbits: Left supraorbital/periorbital soft tissue swelling, with laceration and few punctate foci of soft tissue gas confined to the preseptal soft tissues. No retro septal gas, stranding or hemorrhage. The globes appear grossly normal and symmetric with prior bilateral lens extractions. Likely benign scleral calcification on the right. Symmetric  appearance of the extraocular musculature and optic nerve sheath complexes. Normal caliber of the superior ophthalmic veins. Sinuses: Paranasal sinuses and mastoid air cells are predominantly clear. Middle ear cavities are clear. Debris in the bilateral external auditory canals. Ossicular chains are normally configured. Soft tissues: Left supraorbital/periorbital soft tissue swelling and laceration. No other significant soft tissue findings, gas or foreign body. CT CERVICAL SPINE FINDINGS Alignment: Stabilization collar absent. Mild rightward lateral flexion and cranial rotation. Straightening of the normal cervical lordosis with some focal reversal centered at the C4 level. Stepwise anterolisthesis C2-C4 is favored to be on a spondylitic basis. Additional anterolisthesis C7 on T1 also likely degenerative and not significantly changed comparison Skull base and vertebrae: The osseous structures appear diffusely demineralized which may limit detection of small or nondisplaced fractures. No acute skull base fracture. No vertebral body fracture or height loss. Progressive spondylitic fusion across the C4-5 levels. Multilevel spondylitic changes throughout the cervical spine as detailed below. Additional moderate to severe arthrosis at the atlantodental and basion dens intervals. No suspicious lytic or blastic lesions in the cervical spine. Soft tissues and spinal canal: No pre or paravertebral fluid or  swelling. No visible canal hematoma. Disc levels: Multilevel intervertebral disc height loss with spondylitic endplate changes., larger disc osteophyte complexes and posterior osseous ridging is pronounced at C3-4, C4-5, C5-6 and C6-7 resulting in mild-to-moderate canal stenosis at these levels. Additional diffuse uncinate spurring and facet hypertrophic changes result in mild-to-moderate multilevel neural foraminal narrowing with more moderate to severe foraminal narrowing on the left at C3-4, C4-5. Upper chest: Biapical  pleuroparenchymal scarring. Calcification of the proximal great vessels and cervical carotids, particular at the bifurcations. Normal thyroid. Other: None. IMPRESSION: 1. Hyperdense subarachnoid hemorrhage within the sulci of the left frontal lobe. Subdural hemorrhage over the left frontoparietal convexity measuring up to 4 mm in maximal thickness. Trace subdural hemorrhage layering along the left tentorium up to 3 mm in maximal thickness. 2. Left frontal and supraorbital scalp swelling and laceration extending. No visible calvarial or acute facial bone fracture. 3. Encephalomalacia of the right occipital lobe and right temporal lobe, unchanged from prior. 4. Stable 11 mm right parafalcine lesion, likely a small meningioma. 5. Indeterminate though stable expansile lesion centered in the anterior glabellar process of the frontal bone. 6. No evidence of acute fracture or traumatic listhesis of the cervical spine. 7. Multilevel spondylitic changes throughout the cervical spine, as described. 8. Progressive spondylitic fusion across the C4-5 levels. 9. Scattered carious lesions and few apical lucencies of the maxillary and mandibular dentition. Correlate with dental exam. 10. Cervical and intracranial atherosclerosis. Critical Value/emergent results were called by telephone at the time of interpretation on 08/12/2020 at 1:07 am to provider [facility] , who verbally acknowledged these results. Electronically Signed   By: Lovena Le M.D.   On: 08/12/2020 01:07       Subjective: Patient seen and examined at bedside.  Sitting on chair.  Hard of hearing and poor historian.  Daughter at bedside.  No overnight fever, vomiting or chest pain reported.  Discharge Exam: Vitals:   08/15/20 2114 08/16/20 0445  BP: 135/63 116/89  Pulse:  81  Resp: 14   Temp: 98.1 F (36.7 C) 97.9 F (36.6 C)  SpO2: 95% 98%    General: Pt is awake, poor historian, hard of hearing.  No distress.   Cardiovascular: rate  controlled, S1/S2 + Respiratory: bilateral decreased breath sounds at bases Abdominal: Soft, NT, ND, bowel sounds + Extremities: Right upper extremity in sling.  No clubbing or cyanosis.    The results of significant diagnostics from this hospitalization (including imaging, microbiology, ancillary and laboratory) are listed below for reference.     Microbiology: Recent Results (from the past 240 hour(s))  Resp Panel by RT-PCR (Flu A&B, Covid) Nasopharyngeal Swab     Status: None   Collection Time: 08/12/20  6:28 AM   Specimen: Nasopharyngeal Swab; Nasopharyngeal(NP) swabs in vial transport medium  Result Value Ref Range Status   SARS Coronavirus 2 by RT PCR NEGATIVE NEGATIVE Final    Comment: (NOTE) SARS-CoV-2 target nucleic acids are NOT DETECTED.  The SARS-CoV-2 RNA is generally detectable in upper respiratory specimens during the acute phase of infection. The lowest concentration of SARS-CoV-2 viral copies this assay can detect is 138 copies/mL. A negative result does not preclude SARS-Cov-2 infection and should not be used as the sole basis for treatment or other patient management decisions. A negative result may occur with  improper specimen collection/handling, submission of specimen other than nasopharyngeal swab, presence of viral mutation(s) within the areas targeted by this assay, and inadequate number of viral copies(<138 copies/mL). A negative result  must be combined with clinical observations, patient history, and epidemiological information. The expected result is Negative.  Fact Sheet for Patients:  EntrepreneurPulse.com.au  Fact Sheet for Healthcare Providers:  IncredibleEmployment.be  This test is no t yet approved or cleared by the [place] FDA and  has been authorized for detection and/or diagnosis of SARS-CoV-2 by FDA under an Emergency Use Authorization (EUA). This EUA will remain  in effect (meaning this test can  be used) for the duration of the COVID-19 declaration under Section 564(b)(1) of the Act, 21 U.S.C.section 360bbb-3(b)(1), unless the authorization is terminated  or revoked sooner.       Influenza A by PCR NEGATIVE NEGATIVE Final   Influenza B by PCR NEGATIVE NEGATIVE Final    Comment: (NOTE) The Xpert Xpress SARS-CoV-2/FLU/RSV plus assay is intended as an aid in the diagnosis of influenza from Nasopharyngeal swab specimens and should not be used as a sole basis for treatment. Nasal washings and aspirates are unacceptable for Xpert Xpress SARS-CoV-2/FLU/RSV testing.  Fact Sheet for Patients: EntrepreneurPulse.com.au  Fact Sheet for Healthcare Providers: IncredibleEmployment.be  This test is not yet approved or cleared by the [place] FDA and has been authorized for detection and/or diagnosis of SARS-CoV-2 by FDA under an Emergency Use Authorization (EUA). This EUA will remain in effect (meaning this test can be used) for the duration of the COVID-19 declaration under Section 564(b)(1) of the Act, 21 U.S.C. section 360bbb-3(b)(1), unless the authorization is terminated or revoked.  Performed at [facility], [place] [address], [place], [place] [address]      Labs: BNP (last 3 results) No results for input(s): BNP in the last 8760 hours. Basic Metabolic Panel: Recent Labs  Lab 08/12/20 0209 08/12/20 0628 08/16/20 0810  NA 141 143 138  K 3.9 3.5 4.4  CL 106 106 102  CO2 25 25 26   GLUCOSE 134* 119* 126*  BUN 24* 20 33*  CREATININE 0.86 0.70 1.18*  CALCIUM 8.6* 8.1* 8.4*  MG  --  1.9  --    Liver Function Tests: Recent Labs  Lab 08/12/20 0628  AST 23  ALT 17  ALKPHOS 53  BILITOT 0.8  PROT 6.0*  ALBUMIN 3.3*   No results for input(s): LIPASE, AMYLASE in the last 168 hours. No results for input(s): AMMONIA in the last 168 hours. CBC: Recent Labs  Lab 08/12/20 0209 08/12/20 0628 08/15/20 0751  08/16/20 0810  WBC 10.0 7.9 11.3* 12.5*  NEUTROABS  --  5.7 7.8* 9.6*  HGB 12.2 9.6* 11.7* 11.6*  HCT 37.3 29.9* 36.0 36.1  MCV 89.7 91.2 89.8 90.3  PLT 170 174 164 159   Cardiac Enzymes: No results for input(s): CKTOTAL, CKMB, CKMBINDEX, TROPONINI in the last 168 hours. BNP: Invalid input(s): POCBNP CBG: Recent Labs  Lab 08/12/20 1558  GLUCAP 112*   D-Dimer No results for input(s): DDIMER in the last 72 hours. Hgb A1c No results for input(s): HGBA1C in the last 72 hours. Lipid Profile No results for input(s): CHOL, HDL, LDLCALC, TRIG, CHOLHDL, LDLDIRECT in the last 72 hours. Thyroid function studies No results for input(s): TSH, T4TOTAL, T3FREE, THYROIDAB in the last 72 hours.  Invalid input(s): FREET3 Anemia work up No results for input(s): VITAMINB12, FOLATE, FERRITIN, TIBC, IRON, RETICCTPCT in the last 72 hours. Urinalysis    Component Value Date/Time   COLORURINE YELLOW 08/13/2020 0000   APPEARANCEUR CLEAR 08/13/2020 0000   LABSPEC 1.014 08/13/2020 0000   PHURINE 7.0 08/13/2020 0000   GLUCOSEU NEGATIVE  08/13/2020 0000   GLUCOSEU NEGATIVE 04/28/2017 1155   HGBUR NEGATIVE 08/13/2020 0000   BILIRUBINUR NEGATIVE 08/13/2020 0000   BILIRUBINUR neg 09/27/2015 1130   KETONESUR NEGATIVE 08/13/2020 0000   PROTEINUR NEGATIVE 08/13/2020 0000   UROBILINOGEN 0.2 04/28/2017 1155   NITRITE NEGATIVE 08/13/2020 0000   LEUKOCYTESUR NEGATIVE 08/13/2020 0000   Sepsis Labs Invalid input(s): PROCALCITONIN,  WBC,  LACTICIDVEN Microbiology Recent Results (from the past 240 hour(s))  Resp Panel by RT-PCR (Flu A&B, Covid) Nasopharyngeal Swab     Status: None   Collection Time: 08/12/20  6:28 AM   Specimen: Nasopharyngeal Swab; Nasopharyngeal(NP) swabs in vial transport medium  Result Value Ref Range Status   SARS Coronavirus 2 by RT PCR NEGATIVE NEGATIVE Final    Comment: (NOTE) SARS-CoV-2 target nucleic acids are NOT DETECTED.  The SARS-CoV-2 RNA is generally detectable in  upper respiratory specimens during the acute phase of infection. The lowest concentration of SARS-CoV-2 viral copies this assay can detect is 138 copies/mL. A negative result does not preclude SARS-Cov-2 infection and should not be used as the sole basis for treatment or other patient management decisions. A negative result may occur with  improper specimen collection/handling, submission of specimen other than nasopharyngeal swab, presence of viral mutation(s) within the areas targeted by this assay, and inadequate number of viral copies(<138 copies/mL). A negative result must be combined with clinical observations, patient history, and epidemiological information. The expected result is Negative.  Fact Sheet for Patients:  EntrepreneurPulse.com.au  Fact Sheet for Healthcare Providers:  IncredibleEmployment.be  This test is no t yet approved or cleared by the [place] FDA and  has been authorized for detection and/or diagnosis of SARS-CoV-2 by FDA under an Emergency Use Authorization (EUA). This EUA will remain  in effect (meaning this test can be used) for the duration of the COVID-19 declaration under Section 564(b)(1) of the Act, 21 U.S.C.section 360bbb-3(b)(1), unless the authorization is terminated  or revoked sooner.       Influenza A by PCR NEGATIVE NEGATIVE Final   Influenza B by PCR NEGATIVE NEGATIVE Final    Comment: (NOTE) The Xpert Xpress SARS-CoV-2/FLU/RSV plus assay is intended as an aid in the diagnosis of influenza from Nasopharyngeal swab specimens and should not be used as a sole basis for treatment. Nasal washings and aspirates are unacceptable for Xpert Xpress SARS-CoV-2/FLU/RSV testing.  Fact Sheet for Patients: EntrepreneurPulse.com.au  Fact Sheet for Healthcare Providers: IncredibleEmployment.be  This test is not yet approved or cleared by the [place] FDA and has been  authorized for detection and/or diagnosis of SARS-CoV-2 by FDA under an Emergency Use Authorization (EUA). This EUA will remain in effect (meaning this test can be used) for the duration of the COVID-19 declaration under Section 564(b)(1) of the Act, 21 U.S.C. section 360bbb-3(b)(1), unless the authorization is terminated or revoked.  Performed at [facility], [place] [address], [place], [place] [address]      Time coordinating discharge: 35 minutes  SIGNED:   Aline August, MD  [facility] 08/16/2020, 10:42 AM

## 2020-08-16 NOTE — Care Management Important Message (Signed)
Important Message  Patient Details IM Letter given to the Patient. Name: [name] MRN: [id number] Date of Birth: 1918/02/21   Medicare Important Message Given:  Yes     Kerin Salen 08/16/2020, 9:53 AM

## 2020-08-16 NOTE — TOC Transition Note (Signed)
Transition of Care [facility]) - CM/SW Discharge Note   Patient Details  Name: [name] MRN: [id number] Date of Birth: 11/08/1917  [facility] of [facility]) CM/SW Contact:  Ross Ludwig, LCSW Phone Number: 08/16/2020, 10:04 AM   Clinical Narrative:     Patient will be going home with home health through [facility].  CSW signing off please reconsult with any other social work needs, home health agency has been notified of planned discharge.   They will start care on Sunday.    Final next level of care: [place] Barriers to Discharge: Barriers Resolved   Patient Goals and CMS Choice Patient states their goals for this hospitalization and ongoing recovery are:: To return back home with family. CMS Medicare.gov Compare Post Acute Care list provided to:: Patient Represenative (must comment) Choice offered to / list presented to : Adult Children  Discharge Placement  Patient discharging home with home health services through [facility].                     Discharge Plan and Services                          HH Arranged: RN,PT,OT,Nurse's Aide [place] Agency: [facility] Date [facility] Agency Contacted: 08/16/20 Time [place]: 1004 Representative spoke with at [place]: Amy  Social Determinants of Health ([place]) Interventions     Readmission Risk Interventions No flowsheet data found.

## 2020-08-16 NOTE — Plan of Care (Signed)
Discharge instructions reviewed with patient and daughter, questions answered, verbalized understanding.  Patient transported to main entrance of hospital to be taken home by daughter.

## 2020-08-16 NOTE — Plan of Care (Signed)
Progressing

## 2020-08-17 ENCOUNTER — Observation Stay (HOSPITAL_COMMUNITY): Payer: Medicare Other

## 2020-08-17 ENCOUNTER — Inpatient Hospital Stay (HOSPITAL_COMMUNITY)
Admission: AD | Admit: 2020-08-17 | Discharge: 2020-08-26 | DRG: 091 | Disposition: A | Discharge status: Rehabilitation Facility | Payer: Medicare Other | Source: Ambulatory Visit | Attending: Family Medicine | Admitting: Family Medicine

## 2020-08-17 DIAGNOSIS — I6201 Nontraumatic acute subdural hemorrhage: Secondary | ICD-10-CM | POA: Diagnosis not present

## 2020-08-17 DIAGNOSIS — R4182 Altered mental status, unspecified: Principal | ICD-10-CM

## 2020-08-17 DIAGNOSIS — N183 Chronic kidney disease, stage 3 unspecified: Secondary | ICD-10-CM | POA: Diagnosis not present

## 2020-08-17 DIAGNOSIS — R0989 Other specified symptoms and signs involving the circulatory and respiratory systems: Secondary | ICD-10-CM | POA: Diagnosis not present

## 2020-08-17 DIAGNOSIS — M79604 Pain in right leg: Secondary | ICD-10-CM | POA: Diagnosis present

## 2020-08-17 DIAGNOSIS — M8580 Other specified disorders of bone density and structure, unspecified site: Secondary | ICD-10-CM | POA: Diagnosis not present

## 2020-08-17 DIAGNOSIS — S065X2D Traumatic subdural hemorrhage with loss of consciousness of 31 minutes to 59 minutes, subsequent encounter: Secondary | ICD-10-CM | POA: Diagnosis not present

## 2020-08-17 DIAGNOSIS — Z803 Family history of malignant neoplasm of breast: Secondary | ICD-10-CM

## 2020-08-17 DIAGNOSIS — R52 Pain, unspecified: Secondary | ICD-10-CM | POA: Diagnosis not present

## 2020-08-17 DIAGNOSIS — T40425A Adverse effect of tramadol, initial encounter: Secondary | ICD-10-CM | POA: Diagnosis not present

## 2020-08-17 DIAGNOSIS — T402X5A Adverse effect of other opioids, initial encounter: Secondary | ICD-10-CM | POA: Diagnosis present

## 2020-08-17 DIAGNOSIS — N179 Acute kidney failure, unspecified: Secondary | ICD-10-CM | POA: Diagnosis present

## 2020-08-17 DIAGNOSIS — S065X0A Traumatic subdural hemorrhage without loss of consciousness, initial encounter: Secondary | ICD-10-CM | POA: Diagnosis not present

## 2020-08-17 DIAGNOSIS — S065X9A Traumatic subdural hemorrhage with loss of consciousness of unspecified duration, initial encounter: Secondary | ICD-10-CM | POA: Diagnosis not present

## 2020-08-17 DIAGNOSIS — E785 Hyperlipidemia, unspecified: Secondary | ICD-10-CM | POA: Diagnosis present

## 2020-08-17 DIAGNOSIS — H35361 Drusen (degenerative) of macula, right eye: Secondary | ICD-10-CM | POA: Diagnosis not present

## 2020-08-17 DIAGNOSIS — F419 Anxiety disorder, unspecified: Secondary | ICD-10-CM | POA: Diagnosis present

## 2020-08-17 DIAGNOSIS — R410 Disorientation, unspecified: Secondary | ICD-10-CM | POA: Diagnosis not present

## 2020-08-17 DIAGNOSIS — M79605 Pain in left leg: Secondary | ICD-10-CM | POA: Diagnosis present

## 2020-08-17 DIAGNOSIS — R269 Unspecified abnormalities of gait and mobility: Secondary | ICD-10-CM | POA: Diagnosis present

## 2020-08-17 DIAGNOSIS — I480 Paroxysmal atrial fibrillation: Secondary | ICD-10-CM | POA: Diagnosis not present

## 2020-08-17 DIAGNOSIS — S066X9A Traumatic subarachnoid hemorrhage with loss of consciousness of unspecified duration, initial encounter: Secondary | ICD-10-CM | POA: Diagnosis present

## 2020-08-17 DIAGNOSIS — D62 Acute posthemorrhagic anemia: Secondary | ICD-10-CM | POA: Diagnosis not present

## 2020-08-17 DIAGNOSIS — S42402A Unspecified fracture of lower end of left humerus, initial encounter for closed fracture: Secondary | ICD-10-CM | POA: Diagnosis not present

## 2020-08-17 DIAGNOSIS — R531 Weakness: Secondary | ICD-10-CM | POA: Diagnosis present

## 2020-08-17 DIAGNOSIS — Z8673 Personal history of transient ischemic attack (TIA), and cerebral infarction without residual deficits: Secondary | ICD-10-CM

## 2020-08-17 DIAGNOSIS — W19XXXA Unspecified fall, initial encounter: Secondary | ICD-10-CM | POA: Diagnosis present

## 2020-08-17 DIAGNOSIS — G928 Other toxic encephalopathy: Principal | ICD-10-CM | POA: Diagnosis present

## 2020-08-17 DIAGNOSIS — S066X0A Traumatic subarachnoid hemorrhage without loss of consciousness, initial encounter: Secondary | ICD-10-CM | POA: Diagnosis not present

## 2020-08-17 DIAGNOSIS — R7989 Other specified abnormal findings of blood chemistry: Secondary | ICD-10-CM | POA: Diagnosis present

## 2020-08-17 DIAGNOSIS — I6200 Nontraumatic subdural hemorrhage, unspecified: Secondary | ICD-10-CM | POA: Diagnosis not present

## 2020-08-17 DIAGNOSIS — I129 Hypertensive chronic kidney disease with stage 1 through stage 4 chronic kidney disease, or unspecified chronic kidney disease: Secondary | ICD-10-CM | POA: Diagnosis present

## 2020-08-17 DIAGNOSIS — K8020 Calculus of gallbladder without cholecystitis without obstruction: Secondary | ICD-10-CM | POA: Diagnosis not present

## 2020-08-17 DIAGNOSIS — J90 Pleural effusion, not elsewhere classified: Secondary | ICD-10-CM | POA: Diagnosis not present

## 2020-08-17 DIAGNOSIS — S52121S Displaced fracture of head of right radius, sequela: Secondary | ICD-10-CM | POA: Diagnosis not present

## 2020-08-17 DIAGNOSIS — E860 Dehydration: Secondary | ICD-10-CM | POA: Diagnosis present

## 2020-08-17 DIAGNOSIS — D72829 Elevated white blood cell count, unspecified: Secondary | ICD-10-CM | POA: Diagnosis not present

## 2020-08-17 DIAGNOSIS — I609 Nontraumatic subarachnoid hemorrhage, unspecified: Secondary | ICD-10-CM | POA: Diagnosis not present

## 2020-08-17 DIAGNOSIS — N289 Disorder of kidney and ureter, unspecified: Secondary | ICD-10-CM | POA: Diagnosis not present

## 2020-08-17 DIAGNOSIS — I482 Chronic atrial fibrillation, unspecified: Secondary | ICD-10-CM | POA: Diagnosis not present

## 2020-08-17 DIAGNOSIS — E8809 Other disorders of plasma-protein metabolism, not elsewhere classified: Secondary | ICD-10-CM | POA: Diagnosis not present

## 2020-08-17 DIAGNOSIS — Z79899 Other long term (current) drug therapy: Secondary | ICD-10-CM | POA: Diagnosis not present

## 2020-08-17 DIAGNOSIS — H3530 Unspecified macular degeneration: Secondary | ICD-10-CM | POA: Diagnosis not present

## 2020-08-17 DIAGNOSIS — I4891 Unspecified atrial fibrillation: Secondary | ICD-10-CM | POA: Diagnosis not present

## 2020-08-17 DIAGNOSIS — R404 Transient alteration of awareness: Secondary | ICD-10-CM | POA: Diagnosis not present

## 2020-08-17 DIAGNOSIS — E854 Organ-limited amyloidosis: Secondary | ICD-10-CM | POA: Diagnosis present

## 2020-08-17 DIAGNOSIS — I680 Cerebral amyloid angiopathy: Secondary | ICD-10-CM | POA: Diagnosis not present

## 2020-08-17 DIAGNOSIS — L89890 Pressure ulcer of other site, unstageable: Secondary | ICD-10-CM | POA: Diagnosis not present

## 2020-08-17 DIAGNOSIS — S065X0D Traumatic subdural hemorrhage without loss of consciousness, subsequent encounter: Secondary | ICD-10-CM | POA: Diagnosis not present

## 2020-08-17 DIAGNOSIS — I517 Cardiomegaly: Secondary | ICD-10-CM | POA: Diagnosis not present

## 2020-08-17 DIAGNOSIS — I10 Essential (primary) hypertension: Secondary | ICD-10-CM | POA: Diagnosis not present

## 2020-08-17 DIAGNOSIS — R7401 Elevation of levels of liver transaminase levels: Secondary | ICD-10-CM | POA: Diagnosis not present

## 2020-08-17 DIAGNOSIS — E46 Unspecified protein-calorie malnutrition: Secondary | ICD-10-CM | POA: Diagnosis not present

## 2020-08-17 DIAGNOSIS — S065X2S Traumatic subdural hemorrhage with loss of consciousness of 31 minutes to 59 minutes, sequela: Secondary | ICD-10-CM | POA: Diagnosis not present

## 2020-08-17 LAB — CK: Total CK: 36 U/L — ABNORMAL LOW (ref 38–234)

## 2020-08-17 LAB — URINALYSIS, ROUTINE W REFLEX MICROSCOPIC
Bilirubin Urine: NEGATIVE
Glucose, UA: NEGATIVE mg/dL
Hgb urine dipstick: NEGATIVE
Ketones, ur: NEGATIVE mg/dL
Leukocytes,Ua: NEGATIVE
Nitrite: NEGATIVE
Protein, ur: NEGATIVE mg/dL
Specific Gravity, Urine: 1.015 (ref 1.005–1.030)
pH: 6 (ref 5.0–8.0)

## 2020-08-17 LAB — TSH: TSH: 2.786 u[IU]/mL (ref 0.350–4.500)

## 2020-08-17 LAB — VITAMIN B12: Vitamin B-12: 693 pg/mL (ref 180–914)

## 2020-08-17 MED ORDER — HYDRALAZINE HCL 25 MG PO TABS
25.0000 mg | ORAL_TABLET | Freq: Four times a day (QID) | ORAL | Status: DC | PRN
  Administered 2020-08-19 – 2020-08-21 (×2): 25 mg via ORAL
  Filled 2020-08-17 (×2): qty 1

## 2020-08-17 MED ORDER — ACETAMINOPHEN 650 MG RE SUPP: 650.0000 mg | Freq: Four times a day (QID) | RECTAL | Status: DC | PRN

## 2020-08-17 MED ORDER — ATORVASTATIN CALCIUM 40 MG PO TABS
40.0000 mg | ORAL_TABLET | Freq: Every day | ORAL | Status: DC
  Administered 2020-08-17 – 2020-08-21 (×5): 40 mg via ORAL
  Filled 2020-08-17 (×5): qty 1

## 2020-08-17 MED ORDER — LOSARTAN POTASSIUM 50 MG PO TABS: 100.0000 mg | ORAL_TABLET | Freq: Every day | ORAL | Status: DC

## 2020-08-17 MED ORDER — AMLODIPINE BESYLATE 5 MG PO TABS
5.0000 mg | ORAL_TABLET | Freq: Every day | ORAL | Status: DC
Start: 2020-08-17 — End: 2020-08-23
  Administered 2020-08-17 – 2020-08-22 (×6): 5 mg via ORAL
  Filled 2020-08-17 (×6): qty 1

## 2020-08-17 MED ORDER — LIDOCAINE 5 % EX PTCH
1.0000 | MEDICATED_PATCH | CUTANEOUS | Status: DC
  Administered 2020-08-17 – 2020-08-26 (×10): 1 via TRANSDERMAL
  Filled 2020-08-17 (×10): qty 1

## 2020-08-17 MED ORDER — ADULT MULTIVITAMIN W/MINERALS CH
1.0000 | ORAL_TABLET | Freq: Every day | ORAL | Status: DC
  Administered 2020-08-17 – 2020-08-26 (×10): 1 via ORAL
  Filled 2020-08-17 (×12): qty 1

## 2020-08-17 MED ORDER — SODIUM CHLORIDE 0.9 % IV SOLN: INTRAVENOUS | Status: DC

## 2020-08-17 MED ORDER — ACETAMINOPHEN 325 MG PO TABS
650.0000 mg | ORAL_TABLET | Freq: Four times a day (QID) | ORAL | Status: DC | PRN
  Administered 2020-08-17 – 2020-08-20 (×3): 650 mg via ORAL
  Filled 2020-08-17 (×3): qty 2

## 2020-08-17 NOTE — H&P (Addendum)
History and Physical    MARKECIA IGARASHI [id number] DOB: 1918/05/23 DOA: 08/17/2020  PCP: Marin Olp, MD (Confirm with patient/family/NH records and if not entered, this has to be entered at [facility] point of entry) Patient coming from: Home  I have personally briefly reviewed patient's old medical records in [place]  Chief Complaint: Increasing confusion  HPI: COVA ROBLEZ is a 86 y.o. female with medical history significant of recent ICH, HTN, HLD, macular degeneration of left eye, amyloid angiopathy with ICH in 2016, was just discharged yesterday from hospital.  Patient currently is still confused, most history was provided by patient daughter at bedside.  Daughter reported during last admission (Fall and [facility]), patient was given strong narcotics for 3 to 4 days to address acute pain from the left elbow fracture, patient became lethargic and episode of delirium.  As a result, oxycodone was discontinued and changed to tramadol.  Patient received her last tramadol yesterday morning before discharge.  Daughter also reported patient had low-grade fever earlier this week.  Denied any cough, urinary symptoms or diarrhea.  Yesterday, patient went home, bur remained sleepy throughout the day, and remained confused, compared to her baseline, before the fall and SAH, patient was able to do all housework herself and even takes care of her husband. Family then took patient back to ED.  ED Course: CT head at [facility] showed 2 mm increasing SAH and new 27mm midline shift to right compared to previous imaging.  Neurosurgery was contacted, recommend no surgical intervention and recommend patient admitted for close monitoring and PT evaluation. Review of Systems: Unable to perform patient confused.  Past Medical History:  Diagnosis Date  . Hypertension   . Macular degeneration of left eye   . Osteopenia   . Stroke [facility])     Past Surgical History:  Procedure Laterality Date   . CATARACT EXTRACTION       reports that she has never smoked. She has never used smokeless tobacco. She reports current alcohol use of about 1.0 standard drink of alcohol per week. She reports that she does not use drugs.  Allergies  Allergen Reactions  . Ace Inhibitors     REACTION: angioedema  . Other     Blood Thinners Cause Brain Bleeds    Family History  Problem Relation Age of Onset  . Cancer Daughter        breast     Prior to Admission medications   Medication Sig Start Date End Date Taking? Authorizing Provider  acetaminophen (TYLENOL) 500 MG tablet Take 1-2 tablets (500-1,000 mg total) by mouth every 6 (six) hours as needed for mild pain or moderate pain. 08/14/20  Yes Patrecia Pour, MD  amLODipine (NORVASC) 5 MG tablet TAKE 1 TABLET BY MOUTH  DAILY 05/23/20  Yes Marin Olp, MD  atorvastatin (LIPITOR) 40 MG tablet TAKE 1 TABLET BY MOUTH AT  BEDTIME 03/04/20  Yes Marin Olp, MD  losartan (COZAAR) 100 MG tablet TAKE 1 TABLET BY MOUTH  DAILY Patient taking differently: Take 100 mg by mouth at bedtime. 10/02/19  Yes Marin Olp, MD  Multiple Vitamin (MULTIVITAMIN) tablet Take 1 tablet by mouth daily.   Yes [provider]  traMADol (ULTRAM) 50 MG tablet Take 1 tablet (50 mg total) by mouth every 12 (twelve) hours as needed for severe pain. Patient not taking: No sig reported 08/14/20   Patrecia Pour, MD    Physical Exam: Vitals:   08/17/20  1051  BP: 134/62  Pulse: 64  Resp: 14  Temp: 98.9 F (37.2 C)  TempSrc: Oral  SpO2: 95%    Constitutional: NAD, calm, comfortable Vitals:   08/17/20 1051  BP: 134/62  Pulse: 64  Resp: 14  Temp: 98.9 F (37.2 C)  TempSrc: Oral  SpO2: 95%   Eyes: PERRL, lids and conjunctivae normal ENMT: Mucous membranes are dry . Posterior pharynx clear of any exudate or lesions.Normal dentition.  Neck: normal, supple, no masses, no thyromegaly Respiratory: clear to auscultation bilaterally, no wheezing, no  crackles. Normal respiratory effort. No accessory muscle use.  Cardiovascular: Regular rate and rhythm, no murmurs / rubs / gallops. No extremity edema. 2+ pedal pulses. No carotid bruits.  Abdomen: no tenderness, no masses palpated. No hepatosplenomegaly. Bowel sounds positive.  Musculoskeletal: no clubbing / cyanosis. No joint deformity upper and lower extremities. Good ROM, no contractures. Normal muscle tone.  Skin: no rashes, lesions, ulcers. No induration Neurologic: No facial droop, moving all limbs, gripping strength of the upper arms are good, fluctuation on the pulse evaluate due to the left elbow fracture.  Following simple commands  psychiatric: Appears to be very sleepy but opens eyes spontaneously.    Labs on Admission: I have personally reviewed following labs and imaging studies  CBC: Recent Labs  Lab 08/12/20 0209 08/12/20 0628 08/15/20 0751 08/16/20 0810  WBC 10.0 7.9 11.3* 12.5*  NEUTROABS  --  5.7 7.8* 9.6*  HGB 12.2 9.6* 11.7* 11.6*  HCT 37.3 29.9* 36.0 36.1  MCV 89.7 91.2 89.8 90.3  PLT 170 174 164 409   Basic Metabolic Panel: Recent Labs  Lab 08/12/20 0209 08/12/20 0628 08/16/20 0810  NA 141 143 138  K 3.9 3.5 4.4  CL 106 106 102  CO2 25 25 26   GLUCOSE 134* 119* 126*  BUN 24* 20 33*  CREATININE 0.86 0.70 1.18*  CALCIUM 8.6* 8.1* 8.4*  MG  --  1.9  --    GFR: Estimated Creatinine Clearance: 20.4 mL/min (A) (by C-G formula based on SCr of 1.18 mg/dL (H)). Liver Function Tests: Recent Labs  Lab 08/12/20 0628  AST 23  ALT 17  ALKPHOS 53  BILITOT 0.8  PROT 6.0*  ALBUMIN 3.3*   No results for input(s): LIPASE, AMYLASE in the last 168 hours. No results for input(s): AMMONIA in the last 168 hours. Coagulation Profile: Recent Labs  Lab 08/12/20 0209  INR 1.1   Cardiac Enzymes: No results for input(s): CKTOTAL, CKMB, CKMBINDEX, TROPONINI in the last 168 hours. BNP (last 3 results) No results for input(s): PROBNP in the last 8760  hours. HbA1C: No results for input(s): HGBA1C in the last 72 hours. CBG: Recent Labs  Lab 08/12/20 1558  GLUCAP 112*   Lipid Profile: No results for input(s): CHOL, HDL, LDLCALC, TRIG, CHOLHDL, LDLDIRECT in the last 72 hours. Thyroid Function Tests: No results for input(s): TSH, T4TOTAL, FREET4, T3FREE, THYROIDAB in the last 72 hours. Anemia Panel: No results for input(s): VITAMINB12, FOLATE, FERRITIN, TIBC, IRON, RETICCTPCT in the last 72 hours. Urine analysis:    Component Value Date/Time   COLORURINE YELLOW 08/13/2020 0000   APPEARANCEUR CLEAR 08/13/2020 0000   LABSPEC 1.014 08/13/2020 0000   PHURINE 7.0 08/13/2020 0000   GLUCOSEU NEGATIVE 08/13/2020 0000   GLUCOSEU NEGATIVE 04/28/2017 1155   HGBUR NEGATIVE 08/13/2020 0000   BILIRUBINUR NEGATIVE 08/13/2020 0000   BILIRUBINUR neg 09/27/2015 1130   KETONESUR NEGATIVE 08/13/2020 0000   PROTEINUR NEGATIVE 08/13/2020 0000   UROBILINOGEN 0.2  04/28/2017 1155   NITRITE NEGATIVE 08/13/2020 0000   LEUKOCYTESUR NEGATIVE 08/13/2020 0000    Radiological Exams on Admission: CT ELBOW LEFT WO CONTRAST  Result Date: 08/15/2020 CLINICAL DATA:  Elbow pain and swelling after recent fall. Subdural hematoma. EXAM: CT OF THE UPPER LEFT EXTREMITY WITHOUT CONTRAST TECHNIQUE: Multidetector CT imaging of the left elbow was performed according to the standard protocol. COMPARISON:  Left elbow radiographs 08/13/2020 FINDINGS: Bones/Joint/Cartilage There is deformity of the radial head and neck which is greatest anteriorly and laterally, probably reflecting a mildly displaced acute fracture. There is no depression of the articular surface. The radiocapitellar articulation is intact. The distal humerus and proximal ulna are intact. There is mild spurring of coronoid and olecranon processes. Small elbow joint effusion demonstrates intermediate density, likely reflecting hemarthrosis related to acute fracture. No erosive changes or bony destruction.  Ligaments Suboptimally assessed by CT. Muscles and Tendons The biceps and triceps tendons appear intact. No focal muscular abnormalities are identified. Soft tissues Dorsal soft tissue swelling without evidence of focal fluid collection, soft tissue emphysema or foreign body. No skin ulceration identified. IMPRESSION: 1. Mildly displaced fracture of the radial head and neck, likely acute. No depression of the articular surface. 2. Associated small left elbow joint hemarthrosis. 3. Dorsal soft tissue swelling without evidence of focal fluid collection, soft tissue emphysema or foreign body. Electronically Signed   By: Richardean Sale M.D.   On: 08/15/2020 14:22    EKG: Ordered  Assessment/Plan Active Problems:   Confusion  (please populate well all problems here in Problem List. (For example, if patient is on BP meds at home and you resume or decide to hold them, it is a problem that needs to be her. Same for CAD, COPD, HLD and so on)  Acute metabolic encephalopathy -Probably multifactorial, from narcotic use, which was stopped already. Clinically pt appears to be very dehydrated, will start IVF to correct dehydration, and other etiology such as PNA and UTI will be studied with UA and chest xray, also ordered incentive spirometry. PT evaluation to follow. And pt is already on CCB, so post SAH vasospasm less likely. If no significant etiology found or no improvement will consider further neuro work up such as EEG +/- further brain image study. -Discussed with daughter at bedside, prognosis guarded given patient age and several acute condition-the same time.  Patient daughter desires full code.  AKI -Appears to be dehydrated, IVF and hold ARB.  Left elbow fracture -Mobilize with a sling, will use topical analgesic  HTN -Continue CCB, hold ARB -As needed hydralazine  DVT prophylaxis: SCD Code Status: Full Code Family Communication: Daughter at bedside Disposition Plan: Expect less than 2  midnight hospital stay to stabilize patient mentation as well as PT evaluation. Consults called: None Admission status: MedSurg observation   Lequita Halt MD [facility] Pager [phone]  08/17/2020, 12:03 PM

## 2020-08-17 NOTE — Progress Notes (Signed)
Received patient from [facility] via transporter. Paged patient placement to clarify who will be the receiving doctor, now awaiting for new orders.

## 2020-08-18 ENCOUNTER — Observation Stay (HOSPITAL_COMMUNITY): Payer: Medicare Other

## 2020-08-18 DIAGNOSIS — I609 Nontraumatic subarachnoid hemorrhage, unspecified: Secondary | ICD-10-CM | POA: Diagnosis not present

## 2020-08-18 DIAGNOSIS — R404 Transient alteration of awareness: Secondary | ICD-10-CM | POA: Diagnosis not present

## 2020-08-18 DIAGNOSIS — S065X9A Traumatic subdural hemorrhage with loss of consciousness of unspecified duration, initial encounter: Secondary | ICD-10-CM | POA: Diagnosis not present

## 2020-08-18 DIAGNOSIS — S066X0A Traumatic subarachnoid hemorrhage without loss of consciousness, initial encounter: Secondary | ICD-10-CM | POA: Diagnosis not present

## 2020-08-18 DIAGNOSIS — I6201 Nontraumatic acute subdural hemorrhage: Secondary | ICD-10-CM | POA: Diagnosis not present

## 2020-08-18 DIAGNOSIS — S065X0A Traumatic subdural hemorrhage without loss of consciousness, initial encounter: Secondary | ICD-10-CM | POA: Diagnosis not present

## 2020-08-18 LAB — BASIC METABOLIC PANEL
Anion gap: 13 (ref 5–15)
BUN: 19 mg/dL (ref 8–23)
CO2: 21 mmol/L — ABNORMAL LOW (ref 22–32)
Calcium: 8.3 mg/dL — ABNORMAL LOW (ref 8.9–10.3)
Chloride: 104 mmol/L (ref 98–111)
Creatinine, Ser: 0.87 mg/dL (ref 0.44–1.00)
GFR, Estimated: 59 mL/min — ABNORMAL LOW (ref >60)
Glucose, Bld: 116 mg/dL — ABNORMAL HIGH (ref 70–99)
Potassium: 3.9 mmol/L (ref 3.5–5.1)
Sodium: 138 mmol/L (ref 135–145)

## 2020-08-18 LAB — CBC
HCT: 36.2 % (ref 36.0–46.0)
Hemoglobin: 11.4 g/dL — ABNORMAL LOW (ref 12.0–15.0)
MCH: 28.6 pg (ref 26.0–34.0)
MCHC: 31.5 g/dL (ref 30.0–36.0)
MCV: 90.7 fL (ref 80.0–100.0)
Platelets: 184 10*3/uL (ref 150–400)
RBC: 3.99 MIL/uL (ref 3.87–5.11)
RDW: 14 % (ref 11.5–15.5)
WBC: 9.4 10*3/uL (ref 4.0–10.5)
nRBC: 0 % (ref 0.0–0.2)

## 2020-08-18 NOTE — Care Management Obs Status (Signed)
MEDICARE OBSERVATION STATUS NOTIFICATION   Patient Details  Name: [name] MRN: [id number] Date of Birth: [birth year]   Medicare Observation Status Notification Given:       Sharin Mons, RN 08/18/2020, 3:04 PM

## 2020-08-18 NOTE — Evaluation (Signed)
Physical Therapy Evaluation Patient Details Name: [name] MRN: [id number] DOB: Feb 23, 1918 Today's Date: 08/18/2020   History of Present Illness  Patient  86 year old female with history of right temporal intracranial hemorrhage with hypertensive crisis in 2016, hypertension, macular degeneration, meningioma, paroxysmal atrial fibrillation, stable amyloid angiopathy, hyperlipidemia, CKD stage III Discharged from [facility] on 12/23 after a short stay for  subarachnoid hemorrhage in the sulcus of the left frontal lobe as well as subdural hemorrhage over the left frontoparietal convexity measuring 4 cm in maximum thickness along with radial head fracture. Pt returned to [facility] on 12/25. CT scan showed 2 mm increasing SAH a new 3 mm midline shift compared to prior imaging.  Clinical Impression  Pt admitted with above diagnosis. Pt currently with functional limitations due to the deficits listed below (see PT Problem List). Pt will benefit from skilled PT to increase their independence and safety with mobility to allow discharge to the venue listed below.  Mobility was fairly limited today, likely mainly from the pts anxiety. Will continue to follow to progress mobility and update DC recommendations as needed.      Follow Up Recommendations Supervision for mobility/OOB;CIR (Per MD note daughter interested in CIR.  We had discussed options during PT session.)    Equipment Recommendations  Wheelchair (measurements PT);Wheelchair cushion (measurements PT)    Recommendations for Other Services       Precautions / Restrictions Precautions Precautions: Fall Precaution Comments: Sling to immobilize LUE, no formal NWB status at this time. Hx of left wrist fracture - splint from home applied to left wrist Required Braces or Orthoses: Sling Restrictions Weight Bearing Restrictions: Yes LUE Weight Bearing: Non weight bearing Other Position/Activity Restrictions: L UE: no order however keeping NWB due to  sling, elbow pain, and shoulder pain      Mobility  Bed Mobility Overal bed mobility: Needs Assistance Bed Mobility: Sit to Supine     Supine to sit: Mod assist;HOB elevated;+2 for physical assistance (due to pt's pain and anxiety)     General bed mobility comments: assist for trunk and scooting to EOB.    Transfers Overall transfer level: Needs assistance Equipment used: 1 person hand held assist Transfers: Sit to/from [facility] Sit to Stand: Mod assist;+2 safety/equipment Stand pivot transfers: Mod assist;+2 safety/equipment       General transfer comment: Pt held to PTs arm during transfer  Ambulation/Gait Ambulation/Gait assistance:  (unable today due to anxiety)              Stairs            Wheelchair Mobility    Modified Rankin (Stroke Patients Only) Modified Rankin (Stroke Patients Only) Pre-Morbid Rankin Score: Slight disability Modified Rankin: Severe disability     Balance Overall balance assessment: Needs assistance Sitting-balance support: Single extremity supported Sitting balance-Leahy Scale: Poor Sitting balance - Comments: when pt became anxious she would require assist with balance, but at times able to hold herself up without difficulty   Standing balance support: Single extremity supported Standing balance-Leahy Scale: Poor Standing balance comment: pt held to PTs arm in standing                             Pertinent Vitals/Pain Pain Assessment: Faces Faces Pain Scale: Hurts whole lot Pain Location: left shoulder Pain Descriptors / Indicators: Grimacing;Guarding Pain Intervention(s): Limited activity within patient's tolerance;Monitored during session    Home Living Family/patient expects to be  discharged to:: Unsure Living Arrangements: Spouse/significant other;Children Available Help at Discharge: Family;Personal care attendant Type of Home: House Home Access: Stairs to enter   [employer] of Steps: 15 stairs down to basement level apartment with chair lift Home Layout: One level (pts apartment all one level after getting down to it) Home Equipment: Grab bars - tub/shower;Shower seat - built in;Grab bars - toilet;Walker - 2 wheels;Wheelchair - Education administrator (comment);Hand held shower head;Walker - 4 wheels Additional Comments: This info pulled from recent stay at [facility] and all appears to remain accurate to the best of my knowledge.  Pt and and spouse live in a basement apartment in her daughter and son-in-law's house. Everythin is very handicap accessible due to her husband also in a WC. They enter into their daughter's house and then do have to go down 16 steps to their apartment, but there is a chair lift. Pt has alwyas used the steps becuase she likes the exercise. They have a caregiver that comes every morning, but the dtr stated they are adding more help and increase time to help them both if needed. They have life alert and multiple safety features. Pt used RW at baseline in the apratment but really liked to stay active and on her feet. Still does her banking, etc.    Prior Function Level of Independence: Independent with assistive device(s)         Comments: Pt ambualtes with a RW at home, and reports being independent with all ADLs prior to recent fall     Hand Dominance   Dominant Hand: Right    Extremity/Trunk Assessment   Upper Extremity Assessment Upper Extremity Assessment: Defer to OT evaluation (Pt very gaurded and painful with LUE)    Lower Extremity Assessment Lower Extremity Assessment: Generalized weakness       Communication   Communication: HOH  Cognition Arousal/Alertness: Awake/alert Behavior During Therapy: Anxious (Pt very anxious about mobilizing and her current situation) Overall Cognitive Status: Impaired/Different from baseline Area of Impairment:  (per daughter pt not remembering things since the original fall several days  ago)                                      General Comments General comments (skin integrity, edema, etc.): Pts daughter present throughout entire eval    Exercises     Assessment/Plan    PT Assessment Patient needs continued PT services  PT Problem List Decreased strength;Decreased activity tolerance;Decreased balance;Decreased mobility;Decreased cognition;Pain       PT Treatment Interventions DME instruction;Gait training;Therapeutic activities;Therapeutic exercise;Balance training;Patient/family education    PT Goals (Current goals can be found in the Care Plan section)  Acute Rehab PT Goals Patient Stated Goal: pt wants to return home PT Goal Formulation: With patient/family Time For Goal Achievement: 09/02/20 Potential to Achieve Goals: Fair    Frequency Min 3X/week   Barriers to discharge        Co-evaluation               AM-PAC PT "6 Clicks" Mobility  Outcome Measure Help needed turning from your back to your side while in a flat bed without using bedrails?: A Lot Help needed moving from lying on your back to sitting on the side of a flat bed without using bedrails?: A Lot Help needed moving to and from a bed to a chair (including a wheelchair)?: A Lot Help needed standing  up from a chair using your arms (e.g., wheelchair or bedside chair)?: A Lot Help needed to walk in hospital room?: Total Help needed climbing 3-5 steps with a railing? : Total 6 Click Score: 10    End of Session Equipment Utilized During Treatment: Gait belt Activity Tolerance: Patient limited by pain;Other (comment) (Limited by anxiety as well) Patient left: in chair;with call bell/phone within reach;with chair alarm set;with family/visitor present Nurse Communication: Mobility status PT Visit Diagnosis: Unsteadiness on feet (R26.81)    Time: [id number] PT Time Calculation (min) (ACUTE ONLY): 36 min   Charges:   PT Evaluation $PT Eval Moderate Complexity: 1  Mod PT Treatments $Gait Training: 8-22 mins        Lavonia Dana, PT   Acute Rehabilitation Services  Pager [phone] Office [phone] 08/18/2020   Melvern Banker 08/18/2020, 3:47 PM

## 2020-08-18 NOTE — Progress Notes (Signed)
PROGRESS NOTE  [name] HYW:[id number] DOB: November 20, 1917 DOA: 08/17/2020 PCP: Marin Olp, MD   LOS: 0 days   Brief Narrative / Interim history: 86 year old female with recent [place], hypertension, hyperlipidemia, macular degeneration of the left eye, unrelated angiopathy with ICH in 2016, was admitted to the hospital 12/19 discharged 12/24 after a fall.  During that hospital stay CT head showed subarachnoid hemorrhage in the sulcus of the left frontal lobe as well as subdural hemorrhage over the left frontoparietal convexity measuring 4 cm in maximum thickness.  Neurosurgery was consulted and recommended conservative management.  She was also found to have a left wrist fracture and orthopedics recommended sling and outpatient follow-up.  Therapy recommended SNF however family took patient home.  She was home for less than 24 hours and had persistent sleepiness, lethargy, confusion as compared to her baseline and they brought her back to the ER.  During prior hospital stay apparently she was given narcotics and became lethargic and had worsening delirium.  She was discharged with tramadol.  In the ED at [facility] CT scan showed 2 mm increasing SAH a new 3 mm midline shift compared to prior imaging, neurosurgery was contacted and recommended no surgical intervention but to be admitted for close monitoring and PT eval  Subjective / 24h Interval events: Alert, oriented, appropriate.  Assessment & Plan: Principal Problem Acute metabolic encephalopathy, weakness -Her encephalopathy has almost resolved as per her daughter who is at bedside.  Is possibly that it was related to her tramadol at home -Now she is alert and oriented, conversant -Physical therapy worked with the patient, I discussed with physical therapist they are recommending SNF.  During prior hospital stay daughter wished for patient to go home however she now realizes how weak she is and daughter is agreeable to SNF.   Daughter also inquiring about CIR, will consult  Active Problems Traumatic subarachnoid hemorrhage, subdural hematoma -Due to the fall that prompted her first hospitalization.  Repeat CT this morning shows a midline shift of 2 mm at most.  Findings were discussed with Dr. Marcello Moores with neurosurgery, no acute interventions are required right now and he recommended repeat CT scan in couple weeks.  Acute kidney injury -Creatinine improved with fluids.  Hold ARB  Left elbow fracture -Use sling, orthopedic surgery evaluated during last hospital stay  Hypertension -Continue Norvasc, blood pressure stable  Hyperlipidemia -continue atorvastatin  Scheduled Meds: . amLODipine  5 mg Oral Daily  . atorvastatin  40 mg Oral QHS  . lidocaine  1 patch Transdermal Q24H  . multivitamin with minerals  1 tablet Oral Daily   Continuous Infusions: PRN Meds:.acetaminophen **OR** acetaminophen, hydrALAZINE  Diet Orders (From admission, onward)    Start     Ordered   08/17/20 1203  Diet Heart Room service appropriate? Yes; Fluid consistency: Thin  Diet effective now       Question Answer Comment  Room service appropriate? Yes   Fluid consistency: Thin      08/17/20 1202          DVT prophylaxis: SCDs Start: 08/17/20 1202     Code Status: Full Code  Family Communication: Daughter present at bedside  Status is: Observation  The patient will require care spanning > 2 midnights and should be moved to inpatient because: Unsafe d/c plan  Dispo: The patient is from: Home              Anticipated d/c is to: SNF  Anticipated d/c date is: 1 day              Patient currently is not medically stable to d/c.  Consultants:  None   Procedures:  None   Microbiology  None   Antimicrobials: None     Objective: Vitals:   08/17/20 1202 08/17/20 1600 08/17/20 2014 08/18/20 0349  BP:  140/73 (!) 138/93 (!) 164/69  Pulse:  97 72 66  Resp:  15 18 16   Temp:   98.2 F (36.8 C)  97.8 F (36.6 C)  TempSrc:  Oral Oral Oral  SpO2: 95% 95%  98%    Intake/Output Summary (Last 24 hours) at 08/18/2020 0901 Last data filed at 08/18/2020 0500 Gross per 24 hour  Intake 838.76 ml  Output 200 ml  Net 638.76 ml   There were no vitals filed for this visit.  Examination:  Constitutional: NAD Eyes: no scleral icterus ENMT: Mucous membranes are moist.  Neck: normal, supple Respiratory: clear to auscultation bilaterally, no wheezing, no crackles.  Cardiovascular: Regular rate and rhythm. No LE edema. Abdomen: non distended, no tenderness. Bowel sounds positive.  Musculoskeletal: no clubbing / cyanosis.  Skin: no rashes Neurologic: Nonfocal Psychiatric: Normal judgment and insight. Alert and oriented x 3.    Data Reviewed: I have independently reviewed following labs and imaging studies   CBC: Recent Labs  Lab 08/12/20 0209 08/12/20 0628 08/15/20 0751 08/16/20 0810 08/18/20 0338  WBC 10.0 7.9 11.3* 12.5* 9.4  NEUTROABS  --  5.7 7.8* 9.6*  --   HGB 12.2 9.6* 11.7* 11.6* 11.4*  HCT 37.3 29.9* 36.0 36.1 36.2  MCV 89.7 91.2 89.8 90.3 90.7  PLT 170 174 164 159 258   Basic Metabolic Panel: Recent Labs  Lab 08/12/20 0209 08/12/20 0628 08/16/20 0810 08/18/20 0338  NA 141 143 138 138  K 3.9 3.5 4.4 3.9  CL 106 106 102 104  CO2 25 25 26  21*  GLUCOSE 134* 119* 126* 116*  BUN 24* 20 33* 19  CREATININE 0.86 0.70 1.18* 0.87  CALCIUM 8.6* 8.1* 8.4* 8.3*  MG  --  1.9  --   --    Liver Function Tests: Recent Labs  Lab 08/12/20 0628  AST 23  ALT 17  ALKPHOS 53  BILITOT 0.8  PROT 6.0*  ALBUMIN 3.3*   Coagulation Profile: Recent Labs  Lab 08/12/20 0209  INR 1.1   HbA1C: No results for input(s): HGBA1C in the last 72 hours. CBG: Recent Labs  Lab 08/12/20 1558  GLUCAP 112*    Recent Results (from the past 240 hour(s))  Resp Panel by RT-PCR (Flu A&B, Covid) Nasopharyngeal Swab     Status: None   Collection Time: 08/12/20  6:28 AM    Specimen: Nasopharyngeal Swab; Nasopharyngeal(NP) swabs in vial transport medium  Result Value Ref Range Status   SARS Coronavirus 2 by RT PCR NEGATIVE NEGATIVE Final    Comment: (NOTE) SARS-CoV-2 target nucleic acids are NOT DETECTED.  The SARS-CoV-2 RNA is generally detectable in upper respiratory specimens during the acute phase of infection. The lowest concentration of SARS-CoV-2 viral copies this assay can detect is 138 copies/mL. A negative result does not preclude SARS-Cov-2 infection and should not be used as the sole basis for treatment or other patient management decisions. A negative result may occur with  improper specimen collection/handling, submission of specimen other than nasopharyngeal swab, presence of viral mutation(s) within the areas targeted by this assay, and inadequate number of viral copies(<138 copies/mL). A  negative result must be combined with clinical observations, patient history, and epidemiological information. The expected result is Negative.  Fact Sheet for Patients:  BloggerCourse.com  Fact Sheet for Healthcare Providers:  SeriousBroker.it  This test is no t yet approved or cleared by the [place] FDA and  has been authorized for detection and/or diagnosis of SARS-CoV-2 by FDA under an Emergency Use Authorization (EUA). This EUA will remain  in effect (meaning this test can be used) for the duration of the COVID-19 declaration under Section 564(b)(1) of the Act, 21 U.S.C.section 360bbb-3(b)(1), unless the authorization is terminated  or revoked sooner.       Influenza A by PCR NEGATIVE NEGATIVE Final   Influenza B by PCR NEGATIVE NEGATIVE Final    Comment: (NOTE) The Xpert Xpress SARS-CoV-2/FLU/RSV plus assay is intended as an aid in the diagnosis of influenza from Nasopharyngeal swab specimens and should not be used as a sole basis for treatment. Nasal washings and aspirates are  unacceptable for Xpert Xpress SARS-CoV-2/FLU/RSV testing.  Fact Sheet for Patients: BloggerCourse.com  Fact Sheet for Healthcare Providers: SeriousBroker.it  This test is not yet approved or cleared by the [place] FDA and has been authorized for detection and/or diagnosis of SARS-CoV-2 by FDA under an Emergency Use Authorization (EUA). This EUA will remain in effect (meaning this test can be used) for the duration of the COVID-19 declaration under Section 564(b)(1) of the Act, 21 U.S.C. section 360bbb-3(b)(1), unless the authorization is terminated or revoked.  Performed at [facility], [address], [place], [place] [address]      Radiology Studies: DG Chest 1 View  Result Date: 08/17/2020 CLINICAL DATA:  Altered mental status EXAM: CHEST  1 VIEW COMPARISON:  08/12/2020 FINDINGS: Mild cardiomegaly. Atherosclerotic calcification of the aortic knob. Calcification of the mitral annulus. No focal airspace consolidation, pleural effusion, or pneumothorax. IMPRESSION: No active disease. Electronically Signed   By: Duanne Guess D.O.   On: 08/17/2020 12:24   CT HEAD WO CONTRAST  Result Date: 08/18/2020 CLINICAL DATA:  Follow-up suspected cerebral hemorrhage EXAM: CT HEAD WITHOUT CONTRAST TECHNIQUE: Contiguous axial images were obtained from the base of the skull through the vertex without intravenous contrast. COMPARISON:  Six days ago FINDINGS: Brain: Small volume subarachnoid hemorrhage along the left frontal convexity, non progressed. There is also small volumes and stable subarachnoid blood at the right occipital lobe. Subdural hematoma with high and low-density components along the left cerebral convexity, with thickness increase in the left parietal region where it measures 8 mm. The increased from prior is mainly low-density. Thin component along the tentorium does not appear progressed. There is slight  rightward midline shift which measures 2 mm at most. Small volume intraventricular hemorrhage at the exit ule horn of the left lateral ventricle. Stable encephalomalacia in the lateral right temporal lobe related to large volume hemorrhage based on a 2016 MRI. Right parafalcine meningioma anteriorly and measuring 9 mm. Vascular: No hyperdense vessel or unexpected calcification. Skull: Normal. Negative for fracture or focal lesion. Sinuses/Orbits: No acute finding. IMPRESSION: 1. Subdural hematoma along the left cerebral convexity with increased low-density component in the parietal region. The collection measures up to 8 mm in thickness; 2 mm of midline shift. 2. Unchanged subarachnoid hemorrhage. Small volume intraventricular hemorrhage is now seen. Electronically Signed   By: Marnee Spring M.D.   On: 08/18/2020 08:52    Pamella Pert, MD, PhD [facility]  Between 7 am - 7 pm I am available, please  contact me via Amion or Securechat  Between 7 pm - 7 am I am not available, please contact night coverage MD/APP via Amion

## 2020-08-18 NOTE — Plan of Care (Signed)
  Problem: Education: Goal: Knowledge of General Education information will improve Description Including pain rating scale, medication(s)/side effects and non-pharmacologic comfort measures Outcome: Progressing   

## 2020-08-19 DIAGNOSIS — D72829 Elevated white blood cell count, unspecified: Secondary | ICD-10-CM | POA: Diagnosis not present

## 2020-08-19 DIAGNOSIS — H3530 Unspecified macular degeneration: Secondary | ICD-10-CM | POA: Diagnosis present

## 2020-08-19 DIAGNOSIS — W19XXXA Unspecified fall, initial encounter: Secondary | ICD-10-CM | POA: Diagnosis present

## 2020-08-19 DIAGNOSIS — R404 Transient alteration of awareness: Secondary | ICD-10-CM | POA: Diagnosis not present

## 2020-08-19 DIAGNOSIS — R0989 Other specified symptoms and signs involving the circulatory and respiratory systems: Secondary | ICD-10-CM | POA: Diagnosis not present

## 2020-08-19 DIAGNOSIS — S065X0A Traumatic subdural hemorrhage without loss of consciousness, initial encounter: Secondary | ICD-10-CM | POA: Diagnosis not present

## 2020-08-19 DIAGNOSIS — H35361 Drusen (degenerative) of macula, right eye: Secondary | ICD-10-CM | POA: Diagnosis present

## 2020-08-19 DIAGNOSIS — N183 Chronic kidney disease, stage 3 unspecified: Secondary | ICD-10-CM | POA: Diagnosis present

## 2020-08-19 DIAGNOSIS — R7401 Elevation of levels of liver transaminase levels: Secondary | ICD-10-CM | POA: Diagnosis not present

## 2020-08-19 DIAGNOSIS — S066X9A Traumatic subarachnoid hemorrhage with loss of consciousness of unspecified duration, initial encounter: Secondary | ICD-10-CM | POA: Diagnosis not present

## 2020-08-19 DIAGNOSIS — S065X2S Traumatic subdural hemorrhage with loss of consciousness of 31 minutes to 59 minutes, sequela: Secondary | ICD-10-CM | POA: Diagnosis not present

## 2020-08-19 DIAGNOSIS — N179 Acute kidney failure, unspecified: Secondary | ICD-10-CM | POA: Diagnosis not present

## 2020-08-19 DIAGNOSIS — R410 Disorientation, unspecified: Secondary | ICD-10-CM | POA: Diagnosis present

## 2020-08-19 DIAGNOSIS — R52 Pain, unspecified: Secondary | ICD-10-CM | POA: Diagnosis not present

## 2020-08-19 DIAGNOSIS — L89890 Pressure ulcer of other site, unstageable: Secondary | ICD-10-CM | POA: Diagnosis not present

## 2020-08-19 DIAGNOSIS — T40425A Adverse effect of tramadol, initial encounter: Secondary | ICD-10-CM | POA: Diagnosis not present

## 2020-08-19 DIAGNOSIS — S42402A Unspecified fracture of lower end of left humerus, initial encounter for closed fracture: Secondary | ICD-10-CM | POA: Diagnosis not present

## 2020-08-19 DIAGNOSIS — S065X9D Traumatic subdural hemorrhage with loss of consciousness of unspecified duration, subsequent encounter: Secondary | ICD-10-CM | POA: Diagnosis not present

## 2020-08-19 DIAGNOSIS — Z8673 Personal history of transient ischemic attack (TIA), and cerebral infarction without residual deficits: Secondary | ICD-10-CM | POA: Diagnosis not present

## 2020-08-19 DIAGNOSIS — F419 Anxiety disorder, unspecified: Secondary | ICD-10-CM | POA: Diagnosis not present

## 2020-08-19 DIAGNOSIS — T402X5A Adverse effect of other opioids, initial encounter: Secondary | ICD-10-CM | POA: Diagnosis not present

## 2020-08-19 DIAGNOSIS — E860 Dehydration: Secondary | ICD-10-CM | POA: Diagnosis not present

## 2020-08-19 DIAGNOSIS — R7989 Other specified abnormal findings of blood chemistry: Secondary | ICD-10-CM | POA: Diagnosis present

## 2020-08-19 DIAGNOSIS — E785 Hyperlipidemia, unspecified: Secondary | ICD-10-CM | POA: Diagnosis not present

## 2020-08-19 DIAGNOSIS — S065X9S Traumatic subdural hemorrhage with loss of consciousness of unspecified duration, sequela: Secondary | ICD-10-CM | POA: Diagnosis not present

## 2020-08-19 DIAGNOSIS — M8580 Other specified disorders of bone density and structure, unspecified site: Secondary | ICD-10-CM | POA: Diagnosis present

## 2020-08-19 DIAGNOSIS — D62 Acute posthemorrhagic anemia: Secondary | ICD-10-CM | POA: Diagnosis not present

## 2020-08-19 DIAGNOSIS — S065X2D Traumatic subdural hemorrhage with loss of consciousness of 31 minutes to 59 minutes, subsequent encounter: Secondary | ICD-10-CM | POA: Diagnosis not present

## 2020-08-19 DIAGNOSIS — R269 Unspecified abnormalities of gait and mobility: Secondary | ICD-10-CM | POA: Diagnosis present

## 2020-08-19 DIAGNOSIS — Z803 Family history of malignant neoplasm of breast: Secondary | ICD-10-CM | POA: Diagnosis not present

## 2020-08-19 DIAGNOSIS — I480 Paroxysmal atrial fibrillation: Secondary | ICD-10-CM | POA: Diagnosis present

## 2020-08-19 DIAGNOSIS — R4182 Altered mental status, unspecified: Secondary | ICD-10-CM | POA: Diagnosis not present

## 2020-08-19 DIAGNOSIS — E8809 Other disorders of plasma-protein metabolism, not elsewhere classified: Secondary | ICD-10-CM | POA: Diagnosis not present

## 2020-08-19 DIAGNOSIS — I129 Hypertensive chronic kidney disease with stage 1 through stage 4 chronic kidney disease, or unspecified chronic kidney disease: Secondary | ICD-10-CM | POA: Diagnosis not present

## 2020-08-19 DIAGNOSIS — M79605 Pain in left leg: Secondary | ICD-10-CM | POA: Diagnosis present

## 2020-08-19 DIAGNOSIS — S52121S Displaced fracture of head of right radius, sequela: Secondary | ICD-10-CM | POA: Diagnosis not present

## 2020-08-19 DIAGNOSIS — E46 Unspecified protein-calorie malnutrition: Secondary | ICD-10-CM | POA: Diagnosis not present

## 2020-08-19 DIAGNOSIS — I10 Essential (primary) hypertension: Secondary | ICD-10-CM | POA: Diagnosis not present

## 2020-08-19 DIAGNOSIS — Z79899 Other long term (current) drug therapy: Secondary | ICD-10-CM | POA: Diagnosis not present

## 2020-08-19 DIAGNOSIS — S065X9A Traumatic subdural hemorrhage with loss of consciousness of unspecified duration, initial encounter: Secondary | ICD-10-CM | POA: Diagnosis not present

## 2020-08-19 DIAGNOSIS — S065X0D Traumatic subdural hemorrhage without loss of consciousness, subsequent encounter: Secondary | ICD-10-CM | POA: Diagnosis not present

## 2020-08-19 DIAGNOSIS — E854 Organ-limited amyloidosis: Secondary | ICD-10-CM | POA: Diagnosis not present

## 2020-08-19 DIAGNOSIS — N289 Disorder of kidney and ureter, unspecified: Secondary | ICD-10-CM | POA: Diagnosis not present

## 2020-08-19 DIAGNOSIS — I680 Cerebral amyloid angiopathy: Secondary | ICD-10-CM | POA: Diagnosis present

## 2020-08-19 DIAGNOSIS — G928 Other toxic encephalopathy: Secondary | ICD-10-CM | POA: Diagnosis not present

## 2020-08-19 DIAGNOSIS — I609 Nontraumatic subarachnoid hemorrhage, unspecified: Secondary | ICD-10-CM | POA: Diagnosis not present

## 2020-08-19 LAB — BASIC METABOLIC PANEL
Anion gap: 11 (ref 5–15)
BUN: 22 mg/dL (ref 8–23)
CO2: 23 mmol/L (ref 22–32)
Calcium: 8.1 mg/dL — ABNORMAL LOW (ref 8.9–10.3)
Chloride: 102 mmol/L (ref 98–111)
Creatinine, Ser: 1.16 mg/dL — ABNORMAL HIGH (ref 0.44–1.00)
GFR, Estimated: 42 mL/min — ABNORMAL LOW (ref >60)
Glucose, Bld: 122 mg/dL — ABNORMAL HIGH (ref 70–99)
Potassium: 3.7 mmol/L (ref 3.5–5.1)
Sodium: 136 mmol/L (ref 135–145)

## 2020-08-19 LAB — CBC
HCT: 32.4 % — ABNORMAL LOW (ref 36.0–46.0)
Hemoglobin: 10.8 g/dL — ABNORMAL LOW (ref 12.0–15.0)
MCH: 28.6 pg (ref 26.0–34.0)
MCHC: 33.3 g/dL (ref 30.0–36.0)
MCV: 85.7 fL (ref 80.0–100.0)
Platelets: 189 10*3/uL (ref 150–400)
RBC: 3.78 MIL/uL — ABNORMAL LOW (ref 3.87–5.11)
RDW: 13.9 % (ref 11.5–15.5)
WBC: 9.1 10*3/uL (ref 4.0–10.5)
nRBC: 0 % (ref 0.0–0.2)

## 2020-08-19 MED ORDER — IPRATROPIUM-ALBUTEROL 0.5-2.5 (3) MG/3ML IN SOLN
3.0000 mL | RESPIRATORY_TRACT | Status: DC | PRN
  Administered 2020-08-19: 22:00:00 3 mL via RESPIRATORY_TRACT
  Filled 2020-08-19: qty 3

## 2020-08-19 NOTE — Progress Notes (Signed)
[facility]  PROGRESS NOTE  [name] [id number] DOB: 10/31/1917 DOA: 08/17/2020 PCP: Marin Olp, MD   Brief HPI:   86 year old female with recent [place], hypertension, hyperlipidemia, macular degeneration of left eye, ICH in 2016 was mated on 08/11/2020 and discharged on 1224 after a fall.  During that hospital stay CT head showed subarachnoid hemorrhage in the sulcus of left frontal lobe as well as subdural hemorrhage over the left frontoparietal convexity measuring 4 cm in maximal thickness.  Neurosurgery was consulted and recommended conservative management.  She was also found to have left elbow  fracture and orthopedics recommended sling and outpatient follow-up.  Physical therapy recommended skilled nursing facility however patient family took her home.  She was home for less than 24 hours and had persistent sleepiness, lethargy and confusion compared to baseline and she was brought back to ER.  During hospital stay she was given oxycodone and became lethargic and had worsening delirium.  She was discharged home with tramadol.  In the ED at [facility] CT scan showed 2 mm increased SAH and 3 mm midline shift compared to prior imaging, neurosurgery was consulted and recommended no surgical intervention but close monitoring was recommended.    Subjective   Patient seen and examined, no new complaints at this time.   Assessment/Plan:     1. Acute metabolic encephalopathy-encephalopathy has almost resolved, likely from opioids and tramadol at home.  Patient is back to baseline.  PT recommended CIR.  Awaiting bed at CIR. 2. Traumatic SAH, SDH-due to fall that prompted her first hospitalization.  Repeat CT head showed midline shift of 2 mm at most.  Neurosurgery was consulted by previous hospitalist who recommended no acute intervention and recommended repeat CT head in 2 weeks. 3. Acute kidney injury-creatinine improved with IV fluids.  ARB on hold. 4. Left elbow  fracture-continue sling, orthopedic surgery to follow-up as outpatient 5. Hypertension-blood pressure is stable, continue multipin.  ARB on hold due to worsening renal function. 6. Hyperlipidemia-continue atorvastatin     COVID-19 Labs  No results for input(s): DDIMER, FERRITIN, LDH, CRP in the last 72 hours.  Lab Results  Component Value Date   [place] NEGATIVE 08/12/2020     Scheduled medications:   . amLODipine  5 mg Oral Daily  . atorvastatin  40 mg Oral QHS  . lidocaine  1 patch Transdermal Q24H  . multivitamin with minerals  1 tablet Oral Daily         CBG: No results for input(s): GLUCAP in the last 168 hours.  SpO2: 99 % O2 Flow Rate (L/min): 0 L/min FiO2 (%): 21 %    CBC: Recent Labs  Lab 08/15/20 0751 08/16/20 0810 08/18/20 0338 08/19/20 0213  WBC 11.3* 12.5* 9.4 9.1  NEUTROABS 7.8* 9.6*  --   --   HGB 11.7* 11.6* 11.4* 10.8*  HCT 36.0 36.1 36.2 32.4*  MCV 89.8 90.3 90.7 85.7  PLT 164 159 184 [id number]    Basic Metabolic Panel: Recent Labs  Lab 08/16/20 0810 08/18/20 0338 08/19/20 0213  NA 138 138 136  K 4.4 3.9 3.7  CL 102 104 102  CO2 26 21* 23  GLUCOSE 126* 116* 122*  BUN 33* 19 22  CREATININE 1.18* 0.87 1.16*  CALCIUM 8.4* 8.3* 8.1*      Antibiotics: Anti-infectives (From admission, onward)   None       DVT prophylaxis: SCDs  Code Status: Full code  Family Communication: Daughter at bedside   Consultants:  Procedures:      Objective   Vitals:   08/18/20 0349 08/18/20 2047 08/19/20 0514 08/19/20 1452  BP: (!) 164/69 (!) 149/69 (!) 155/68 139/60  Pulse: 66 75 64 69  Resp: 16 17 17 17   Temp: 97.8 F (36.6 C) 99.7 F (37.6 C) 98 F (36.7 C) 99.2 F (37.3 C)  TempSrc: Oral Oral Oral Oral  SpO2: 98% 96% 95% 99%    Intake/Output Summary (Last 24 hours) at 08/19/2020 1611 Last data filed at 08/19/2020 1500 Gross per 24 hour  Intake 120 ml  Output 600 ml  Net -480 ml    12/25 1901 - 12/27  0700 In: 943.8 [P.O.:400; I.V.:543.8] Out: 500 [Urine:500]  There were no vitals filed for this visit.  Physical Examination:    General-appears in no acute distress  Heart-S1-S2, regular, no murmur auscultated  Lungs-clear to auscultation bilaterally, no wheezing or crackles auscultated  Abdomen-soft, nontender, no organomegaly  Extremities-no edema in the lower extremities  Neuro-alert, oriented x3, no focal deficit noted   Status is: Inpatient  Dispo: The patient is from: Home              Anticipated d/c is to: CIR              Anticipated d/c date is: 08/20/2020              Patient currently medically stable for discharge  Barrier to discharge-awaiting bed at [facility]        Data Reviewed:   Recent Results (from the past 240 hour(s))  Resp Panel by RT-PCR (Flu A&B, Covid) Nasopharyngeal Swab     Status: None   Collection Time: 08/12/20  6:28 AM   Specimen: Nasopharyngeal Swab; Nasopharyngeal(NP) swabs in vial transport medium  Result Value Ref Range Status   SARS Coronavirus 2 by RT PCR NEGATIVE NEGATIVE Final    Comment: (NOTE) SARS-CoV-2 target nucleic acids are NOT DETECTED.  The SARS-CoV-2 RNA is generally detectable in upper respiratory specimens during the acute phase of infection. The lowest concentration of SARS-CoV-2 viral copies this assay can detect is 138 copies/mL. A negative result does not preclude SARS-Cov-2 infection and should not be used as the sole basis for treatment or other patient management decisions. A negative result may occur with  improper specimen collection/handling, submission of specimen other than nasopharyngeal swab, presence of viral mutation(s) within the areas targeted by this assay, and inadequate number of viral copies(<138 copies/mL). A negative result must be combined with clinical observations, patient history, and epidemiological information. The expected result is Negative.  Fact Sheet for Patients:   08/14/20  Fact Sheet for Healthcare Providers:  BloggerCourse.com  This test is no t yet approved or cleared by the [place]iousBroker.[place] FDA and  has been authorized for detection and/or diagnosis of SARS-CoV-2 by FDA under an Emergency Use Authorization (EUA). This EUA will remain  in effect (meaning this test can be used) for the duration of the COVID-19 declaration under Section 564(b)(1) of the Act, 21 U.S.C.section 360bbb-3(b)(1), unless the authorization is terminated  or revoked sooner.       Influenza A by PCR NEGATIVE NEGATIVE Final   Influenza B by PCR NEGATIVE NEGATIVE Final    Comment: (NOTE) The Xpert Xpress SARS-CoV-2/FLU/RSV plus assay is intended as an aid in the diagnosis of influenza from Nasopharyngeal swab specimens and should not be used as a sole basis for treatment. Nasal washings and aspirates are unacceptable for Xpert Xpress SARS-CoV-2/FLU/RSV testing.  Fact  Sheet for Patients: EntrepreneurPulse.com.au  Fact Sheet for Healthcare Providers: IncredibleEmployment.be  This test is not yet approved or cleared by the [place] FDA and has been authorized for detection and/or diagnosis of SARS-CoV-2 by FDA under an Emergency Use Authorization (EUA). This EUA will remain in effect (meaning this test can be used) for the duration of the COVID-19 declaration under Section 564(b)(1) of the Act, 21 U.S.C. section 360bbb-3(b)(1), unless the authorization is terminated or revoked.  Performed at [facility], [place] [address], [place], [place] [address]     No results for input(s): LIPASE, AMYLASE in the last 168 hours. No results for input(s): AMMONIA in the last 168 hours.  Cardiac Enzymes: Recent Labs  Lab 08/17/20 1430  CKTOTAL 36*   BNP (last 3 results) No results for input(s): BNP in the last 8760 hours.  ProBNP (last 3 results) No results  for input(s): PROBNP in the last 8760 hours.  Studies:  CT HEAD WO CONTRAST  Result Date: 08/18/2020 CLINICAL DATA:  Follow-up suspected cerebral hemorrhage EXAM: CT HEAD WITHOUT CONTRAST TECHNIQUE: Contiguous axial images were obtained from the base of the skull through the vertex without intravenous contrast. COMPARISON:  Six days ago FINDINGS: Brain: Small volume subarachnoid hemorrhage along the left frontal convexity, non progressed. There is also small volumes and stable subarachnoid blood at the right occipital lobe. Subdural hematoma with high and low-density components along the left cerebral convexity, with thickness increase in the left parietal region where it measures 8 mm. The increased from prior is mainly low-density. Thin component along the tentorium does not appear progressed. There is slight rightward midline shift which measures 2 mm at most. Small volume intraventricular hemorrhage at the exit ule horn of the left lateral ventricle. Stable encephalomalacia in the lateral right temporal lobe related to large volume hemorrhage based on a 2016 MRI. Right parafalcine meningioma anteriorly and measuring 9 mm. Vascular: No hyperdense vessel or unexpected calcification. Skull: Normal. Negative for fracture or focal lesion. Sinuses/Orbits: No acute finding. IMPRESSION: 1. Subdural hematoma along the left cerebral convexity with increased low-density component in the parietal region. The collection measures up to 8 mm in thickness; 2 mm of midline shift. 2. Unchanged subarachnoid hemorrhage. Small volume intraventricular hemorrhage is now seen. Electronically Signed   By: Monte Fantasia M.D.   On: 08/18/2020 08:52       [place]   [facility] If 7PM-7AM, please contact night-coverage at [email or web address], Office  [phone]   08/19/2020, 4:11 PM  LOS: 0 days

## 2020-08-19 NOTE — Consult Note (Signed)
Physical Medicine and Rehabilitation Consult  Reason for Consult: Functional deficits due to TBI.  Referring Physician: Dr. Cruzita Lederer   HPI: [name] is a 86 y.o. female with history of HTN, macular degeneration, right temporal ICH, amyloid angiopathy, PAF who was recently admitted with left frontal lobe SAH/left frontoparietal SDH and fall with left wrist/radial head fracture.  She was discharged to home on 12/24 but readmitted on 12/25 with ongoing confusion with lethargy and inability to care for self.   Follow-up CT head showed increase in left parietal SAH and slight 2 mm rightward midline shift. Lethargy felt to be multifactorial due to tramadol and acute on chronic renal failure treated with IVF with improvement in encephalopathy.  LUE continues in sling with NWB on left wrist. Therapy evaluations completed revealing deficits due to weakness, anxiety with fear of falling affecting mobility. Physical Medicine & Rehabilitation was consulted to assess candidacy for CIR given impaired mobility and ADLs.  Per reports: patient and husband (WC bound- has caregiver for few hours) live with family in basement apartment. Patient was independent with RW PTA. She has been to CIR before with excellent results.   Review of Systems  Unable to perform ROS: Mental acuity     Past Medical History:  Diagnosis Date  . Hypertension   . Macular degeneration of left eye   . Osteopenia   . Stroke [facility])     Past Surgical History:  Procedure Laterality Date  . CATARACT EXTRACTION      Family History  Problem Relation Age of Onset  . Cancer Daughter        breast    Social History:  reports that she has never smoked. She has never used smokeless tobacco. She reports current alcohol use of about 1.0 standard drink of alcohol per week. She reports that she does not use drugs.    Allergies  Allergen Reactions  . Ace Inhibitors     REACTION: angioedema  . Other     Blood Thinners  Cause Brain Bleeds    Medications Prior to Admission  Medication Sig Dispense Refill  . acetaminophen (TYLENOL) 500 MG tablet Take 1-2 tablets (500-1,000 mg total) by mouth every 6 (six) hours as needed for mild pain or moderate pain. 30 tablet 0  . amLODipine (NORVASC) 5 MG tablet TAKE 1 TABLET BY MOUTH  DAILY 90 tablet 3  . atorvastatin (LIPITOR) 40 MG tablet TAKE 1 TABLET BY MOUTH AT  BEDTIME 90 tablet 3  . losartan (COZAAR) 100 MG tablet TAKE 1 TABLET BY MOUTH  DAILY (Patient taking differently: Take 100 mg by mouth at bedtime.) 90 tablet 3  . Multiple Vitamin (MULTIVITAMIN) tablet Take 1 tablet by mouth daily.    . traMADol (ULTRAM) 50 MG tablet Take 1 tablet (50 mg total) by mouth every 12 (twelve) hours as needed for severe pain. (Patient not taking: No sig reported) 6 tablet 0    Home: Home Living Family/patient expects to be discharged to:: Unsure Living Arrangements: Spouse/significant other,Children Available Help at Discharge: Family,Personal care attendant Type of Home: House Home Access: Stairs to enter [employer] of Steps: 15 stairs down to basement level apartment with chair lift Home Layout: One level (pts apartment all one level after getting down to it) Bathroom Toilet: Handicapped height Home Equipment: Grab bars - tub/shower,Shower seat - built in,Grab bars - toilet,Walker - 2 wheels,Wheelchair - manual,Other (comment),Hand held shower head,Walker - 4 wheels Additional Comments: This info pulled from  recent stay at [facility] and all appears to remain accurate to the best of my knowledge.  Pt and and spouse live in a basement apartment in her daughter and son-in-law's house. Everythin is very handicap accessible due to her husband also in a WC. They enter into their daughter's house and then do have to go down 16 steps to their apartment, but there is a chair lift. Pt has alwyas used the steps becuase she likes the exercise. They have a caregiver that comes every  morning, but the dtr stated they are adding more help and increase time to help them both if needed. They have life alert and multiple safety features. Pt used RW at baseline in the apratment but really liked to stay active and on her feet. Still does her banking, etc.  Functional History: Prior Function Level of Independence: Independent with assistive device(s) Comments: Pt ambualtes with a RW at home, and reports being independent with all ADLs prior to recent fall Functional Status:  Mobility: Bed Mobility Overal bed mobility: Needs Assistance Bed Mobility: Sit to Supine Supine to sit: Mod assist,HOB elevated,+2 for physical assistance (due to pt's pain and anxiety) General bed mobility comments: assist for trunk and scooting to EOB. Transfers Overall transfer level: Needs assistance Equipment used: 1 person hand held assist Transfers: Sit to/from [place] to Stand: Mod assist,+2 safety/equipment Stand pivot transfers: Mod assist,+2 safety/equipment General transfer comment: Pt held to PTs arm during transfer Ambulation/Gait Ambulation/Gait assistance:  (unable today due to anxiety)    ADL:    Cognition: Cognition Overall Cognitive Status: Impaired/Different from baseline Orientation Level: Oriented to person Cognition Arousal/Alertness: Awake/alert Behavior During Therapy: Anxious (Pt very anxious about mobilizing and her current situation) Overall Cognitive Status: Impaired/Different from baseline Area of Impairment:  (per daughter pt not remembering things since the original fall several days ago)  Blood pressure (!) 155/68, pulse 64, temperature 98 F (36.7 C), temperature source Oral, resp. rate 17, SpO2 95 %. Physical Exam General: Alert, No apparent distress HEENT: Head is normocephalic, atraumatic, PERRLA, EOMI, sclera anicteric, oral mucosa pink and moist, dentition intact, ext ear canals clear,  Neck: Supple without JVD or  lymphadenopathy Heart: Reg rate and rhythm. No murmurs rubs or gallops Chest: CTA bilaterally without wheezes, rales, or rhonchi; no distress Abdomen: Soft, non-tender, non-distended, bowel sounds positive. Extremities: No clubbing, cyanosis, or edema. Pulses are 2+ Skin: Clean and intact without signs of breakdown Neuro: Anxious and fearful appearing--disoriented and tended to reply "I don't know" to most questions. She was oriented X 3 with cues. Slow movement and right knee pain due to stiffness/ OA. LUE limited by pain.   Musculoskeletal: LUE with sling in place. Psych: Pt's affect is appropriate. Pt is cooperative  Results for orders placed or performed during the hospital encounter of 08/17/20 (from the past 24 hour(s))  Basic metabolic panel     Status: Abnormal   Collection Time: 08/19/20  2:13 AM  Result Value Ref Range   Sodium 136 135 - 145 mmol/L   Potassium 3.7 3.5 - 5.1 mmol/L   Chloride 102 98 - 111 mmol/L   CO2 23 22 - 32 mmol/L   Glucose, Bld 122 (H) 70 - 99 mg/dL   BUN 22 8 - 23 mg/dL   Creatinine, Ser 1.16 (H) 0.44 - 1.00 mg/dL   Calcium 8.1 (L) 8.9 - 10.3 mg/dL   GFR, Estimated 42 (L) >60 mL/min   Anion gap 11 5 - 15  CBC  Status: Abnormal   Collection Time: 08/19/20  2:13 AM  Result Value Ref Range   WBC 9.1 4.0 - 10.5 K/uL   RBC 3.78 (L) 3.87 - 5.11 MIL/uL   Hemoglobin 10.8 (L) 12.0 - 15.0 g/dL   HCT 32.4 (L) 36.0 - 46.0 %   MCV 85.7 80.0 - 100.0 fL   MCH 28.6 26.0 - 34.0 pg   MCHC 33.3 30.0 - 36.0 g/dL   RDW 13.9 11.5 - 15.5 %   Platelets 189 150 - 400 K/uL   nRBC 0.0 0.0 - 0.2 %   DG Chest 1 View  Result Date: 08/17/2020 CLINICAL DATA:  Altered mental status EXAM: CHEST  1 VIEW COMPARISON:  08/12/2020 FINDINGS: Mild cardiomegaly. Atherosclerotic calcification of the aortic knob. Calcification of the mitral annulus. No focal airspace consolidation, pleural effusion, or pneumothorax. IMPRESSION: No active disease. Electronically Signed   By: Davina Poke D.O.   On: 08/17/2020 12:24   CT HEAD WO CONTRAST  Result Date: 08/18/2020 CLINICAL DATA:  Follow-up suspected cerebral hemorrhage EXAM: CT HEAD WITHOUT CONTRAST TECHNIQUE: Contiguous axial images were obtained from the base of the skull through the vertex without intravenous contrast. COMPARISON:  Six days ago FINDINGS: Brain: Small volume subarachnoid hemorrhage along the left frontal convexity, non progressed. There is also small volumes and stable subarachnoid blood at the right occipital lobe. Subdural hematoma with high and low-density components along the left cerebral convexity, with thickness increase in the left parietal region where it measures 8 mm. The increased from prior is mainly low-density. Thin component along the tentorium does not appear progressed. There is slight rightward midline shift which measures 2 mm at most. Small volume intraventricular hemorrhage at the exit ule horn of the left lateral ventricle. Stable encephalomalacia in the lateral right temporal lobe related to large volume hemorrhage based on a 2016 MRI. Right parafalcine meningioma anteriorly and measuring 9 mm. Vascular: No hyperdense vessel or unexpected calcification. Skull: Normal. Negative for fracture or focal lesion. Sinuses/Orbits: No acute finding. IMPRESSION: 1. Subdural hematoma along the left cerebral convexity with increased low-density component in the parietal region. The collection measures up to 8 mm in thickness; 2 mm of midline shift. 2. Unchanged subarachnoid hemorrhage. Small volume intraventricular hemorrhage is now seen. Electronically Signed   By: Monte Fantasia M.D.   On: 08/18/2020 08:52     Assessment/Plan: Diagnosis: SAH 1. Does the need for close, 24 hr/day medical supervision in concert with the patient's rehab needs make it unreasonable for this patient to be served in a less intensive setting? Yes 2. Co-Morbidities requiring supervision/potential complications: right temporal  intracranial hemorrhage with hypertensive crisis in 2016, hypertension, macular degeneration, meningioma, paroxysmal atrial fibrillation 3. Due to bladder management, bowel management, safety, skin/wound care, disease management, medication administration, pain management and patient education, does the patient require 24 hr/day rehab nursing? Yes 4. Does the patient require coordinated care of a physician, rehab nurse, therapy disciplines of PT, OT, SLP to address physical and functional deficits in the context of the above medical diagnosis(es)? Yes Addressing deficits in the following areas: balance, endurance, locomotion, strength, transferring, bowel/bladder control, bathing, dressing, feeding, grooming, toileting, cognition and psychosocial support 5. Can the patient actively participate in an intensive therapy program of at least 3 hrs of therapy per day at least 5 days per week? Yes 6. The potential for patient to make measurable gains while on inpatient rehab is excellent 7. Anticipated functional outcomes upon discharge from inpatient rehab are modified  independent and supervision  with PT, supervision with OT, supervision with SLP. 8. Estimated rehab length of stay to reach the above functional goals is: 14-18 days 9. Anticipated discharge destination: Home 10. Overall Rehab/Functional Prognosis: excellent  RECOMMENDATIONS: This patient's condition is appropriate for continued rehabilitative care in the following setting: CIR Patient has agreed to participate in recommended program. Yes Note that insurance prior authorization may be required for reimbursement for recommended care.  Comment:  1) SAH: admit to CIR once auth obtained and bed is available. 2) Impaired cognition: family confirms 24/7 support upon discharge 3) Impaired mobility and ADLs: unable to attempt ambulation due to anxiety: provide counseling regarding her fear of falling. 4) Dry lips: ordered vaseline for her  lips.  Thank you for this consult. Admission coordinator to follow.   I have personally performed a face to face diagnostic evaluation, including, but not limited to relevant history and physical exam findings, of this patient and developed relevant assessment and plan.  Additionally, I have reviewed and concur with the physician assistant's documentation above.  Leeroy Cha, MD  Bary Leriche, PA-C 08/19/2020

## 2020-08-19 NOTE — PMR Pre-admission (Signed)
PMR Admission Coordinator Pre-Admission Assessment  Patient: [name] is an 86 y.o., female MRN: [id number] DOB: 1918-02-03 Height:   Weight:                Insurance Information HMO: yes    PPO:      PCP:      IPA:      80/20:      OTHER:   PRIMARY: [employer] Medicare      Policy#: [id number]      Subscriber: patient CM Name: Jackelyn Poling       Phone#: [phone]     Fax#: [phone] Pre-Cert#: [id number]      Employer:  [employer] provided by Jackelyn Poling with Bernadene Bell for admit to CIR. Next review date is 08/30/20. Clinicals to be faxed (f): [phone] Benefits:  Phone #:online      Name: [email or web address] Eff. Date: 08/24/2020-present     Deduct: $0 (does not have deductible)      Out of Pocket Max: $3,600 ($0 met)      Life Max:   CIR: $295/day co-pay for days 1-5, $0/day co-pay for days 6+      SNF: $0/day co-pay for days 1-20, $188/day co-pay for days 21-10, $0/day co-pay for days 41-100; limtied to 100 days/cal yr Outpatient: $30/visit co-pay ; limited by medical necessity     Home Health: 100% coverage, 0% co-insurance; limited by medical necessity      DME: 80% coverage, 20% co-insurance Providers:   SECONDARY: None      Policy#:       Phone#:   Development worker, community:       Phone#:   The Actuary for patients in Inpatient Rehabilitation Facilities with attached Privacy Act [place] Records was provided and verbally reviewed with: Patient and Family  Emergency Contact Information Contact Information    Name Relation Home Work Mobile   Zinser,Joan Daughter [phone]  [phone]   Grete, Bosko [phone]  [phone]     Current Medical History  Patient Admitting Diagnosis: SAH  History of Present Illness:  [name] is a 86 year old right-handed female with history of hypertension macular degeneration right temporal ICH secondary to hypertensive crisis 2016, amyloid angiopathy, CKD stage III, PAF.  Patient with recent  left frontal lobe SAH left frontal parietal SDH after a fall with left wrist radial head fracture 08/11/2020 with recommendations by orthopedic services for sling and outpatient follow-up with Dr. Lorin Mercy and currently nonweightbearing.  She was discharged home 08/16/2020 from [facility] although skilled nursing facility was recommended but readmitted 08/17/2020 with ongoing confusion with lethargy and inability to care for herself.  Per chart review patient currently lives with spouse and daughter.  1 level home with 15 steps down to basement level apartment with chairlift.  They have a caregiver that comes in every morning but the daughter is adding more care in the home as needed.  They have a life alert and multiple safety features in the house.  Patient was using rolling walker at baseline prior to SDH.  Follow-up cranial CT scan showed increase in left parietal SAH and slight 2 mm rightward midline shift.  Lethargy felt to be multifactorial due to tramadol as well as acute on chronic renal failure.  Admission chemistries glucose 126 BUN 33 creatinine 1.18, hemoglobin 11.6 WBC 12,500, TSH 2.786, total CK 36, urinalysis negative nitrite.  Patient was placed on IV fluids for hydration with latest creatinine 0.82.  Neurosurgery consulted advised conservative care of traumatic SAH/SDH.  Blood pressure monitored.  ARB on hold due to renal insufficiency.  She is tolerating a regular diet.  Close monitoring pain control currently with Lidoderm patch tramadol has been discontinued.  Due to patient decrease in functional ability altered mental status is to be admitted for a comprehensive rehab program on 08/26/20.      Past Medical History  Past Medical History:  Diagnosis Date   Hypertension    Macular degeneration of left eye    Osteopenia    Stroke [facility])     Family History  family history includes Cancer in her daughter.  Prior Rehab/Hospitalizations:  Has the patient had prior rehab or  hospitalizations prior to admission? Yes  Has the patient had major surgery during 100 days prior to admission? No  Current Medications   Current Facility-Administered Medications:    diclofenac Sodium (VOLTAREN) 1 % topical gel 2 g, 2 g, Topical, QID, Elodia Florence., MD, 2 g at 08/26/20 0947   hydrALAZINE (APRESOLINE) tablet 25 mg, 25 mg, Oral, Q6H PRN, Wynetta Fines T, MD, 25 mg at 08/21/20 1450   ipratropium-albuterol (DUONEB) 0.5-2.5 (3) MG/3ML nebulizer solution 3 mL, 3 mL, Nebulization, Q4H PRN, Lang Snow, FNP, 3 mL at 08/19/20 2140   lidocaine (LIDODERM) 5 % 1 patch, 1 patch, Transdermal, Q24H, Lequita Halt, MD, 1 patch at 08/25/20 1341   multivitamin with minerals tablet 1 tablet, 1 tablet, Oral, Daily, Wynetta Fines T, MD, 1 tablet at 08/26/20 5697  Patients Current Diet:  Diet Order            Diet - low sodium heart healthy           Diet Heart Room service appropriate? Yes; Fluid consistency: Thin  Diet effective now                 Precautions / Restrictions Precautions Precautions: Fall Precaution Comments: Sling to immobilize LUE, no formal NWB status at this time. Hx of left wrist fracture - splint from home applied to left wrist Restrictions Weight Bearing Restrictions: Yes LUE Weight Bearing: Partial weight bearing Other Position/Activity Restrictions: L UE: no order however keeping NWB due to sling and shoulder pain   Has the patient had 2 or more falls or a fall with injury in the past year?Yes  Prior Activity Level Community (5-7x/wk): active, independent PTA; used RW in the house; would climb hills outside per daughter  Prior Functional Level Prior Function Level of Independence: Independent with assistive device(s) Comments: Pt ambualtes with a RW at home, and reports being independent with all ADLs prior to recent fall  Self Care: Did the patient need help bathing, dressing, using the toilet or eating?  Independent  Indoor  Mobility: Did the patient need assistance with walking from room to room (with or without device)? Independent  Stairs: Did the patient need assistance with internal or external stairs (with or without device)? Independent  Functional Cognition: Did the patient need help planning regular tasks such as shopping or remembering to take medications? Independent  Home Assistive Devices / Equipment Home Equipment: Grab bars - tub/shower,Shower seat - built in,Grab bars - toilet,Walker - 2 wheels,Wheelchair - manual,Other (comment),Hand held shower head,Walker - 4 wheels  Prior Device Use: Indicate devices/aids used by the patient prior to current illness, exacerbation or injury? Walker  Current Functional Level Cognition  Overall Cognitive Status: Impaired/Different from baseline Current Attention Level: Selective Orientation Level: Oriented X4 Following Commands: Follows one step commands with increased time General  Comments: Patient with increasing anxiety which hinders memory/attention to tasks    Extremity Assessment (includes Sensation/Coordination)  Upper Extremity Assessment: RUE deficits/detail,LUE deficits/detail RUE Deficits / Details: Grossly 4-/5 LUE Deficits / Details: L UE in sling. reports of more pain in shoulder than in elbow. wrist, hand elbow ROM WFL. LUE: Unable to fully assess due to immobilization,Unable to fully assess due to pain LUE Sensation: WNL LUE Coordination: decreased gross motor  Lower Extremity Assessment: Defer to PT evaluation    ADLs  Overall ADL's : Needs assistance/impaired Eating/Feeding: Set up,Sitting Eating/Feeding Details (indicate cue type and reason): Setup to open containers, prepare coffee Grooming: Sitting,Minimal assistance Grooming Details (indicate cue type and reason): will be MIn A for bimanual tasks. able to wash face with setup, don glassess with one hand. assistance needed to clean glasses Upper Body Bathing: Maximal  assistance,Sitting Lower Body Bathing: Maximal assistance,Sit to/from stand,Sitting/lateral leans Upper Body Dressing : Maximal assistance,Sitting Lower Body Dressing: Total assistance,Sit to/from stand Toileting- Water quality scientist and Hygiene: Total assistance,Sit to/from stand,Sitting/lateral lean General ADL Comments: Pt limited by pain, anxiety and decreased standing balance    Mobility  Overal bed mobility: Needs Assistance Bed Mobility: Sit to Supine Supine to sit: Mod assist,HOB elevated,+2 for physical assistance (due to pt's pain and anxiety) General bed mobility comments: pt OOB in recliner at start and end of session    Transfers  Overall transfer level: Needs assistance Equipment used: 2 person hand held assist Transfers: Sit to/from Stand Sit to Stand: Mod assist Stand pivot transfers: Mod assist,+2 safety/equipment General transfer comment: sit-stand x2 with modA from recliner. pt with reports of significant pain, unable to progress to standing march or wt shift. fatigues after ~5-8 seconds and turns into maxA    Ambulation / Gait / Stairs / Wheelchair Mobility  Ambulation/Gait Ambulation/Gait assistance: Herbalist (Feet): 18 Feet (x 6', x 12') Assistive device: 2 person hand held assist (face to face, chair follow and physical assist provided by +2) Gait Pattern/deviations: Step-to pattern,Decreased step length - left,Decreased stride length,Decreased weight shift to left,Trunk flexed,Wide base of support General Gait Details: pt unable to generate wt shift for stepping today due to pain Gait velocity: decreased    Posture / Balance Dynamic Sitting Balance Sitting balance - Comments: reliant on R UE support Balance Overall balance assessment: Needs assistance Sitting-balance support: Single extremity supported Sitting balance-Leahy Scale: Poor Sitting balance - Comments: reliant on R UE support Standing balance support: Single extremity  supported Standing balance-Leahy Scale: Poor Standing balance comment: reliant on external support    Special needs/care consideration Skin abrasion to left eye, ecchymosis to bilateral hands; wound to left upper face       Previous Home Environment (from acute therapy documentation) Living Arrangements: Spouse/significant other,Children Available Help at Discharge: Family,Personal care attendant Type of Home: House Home Layout: One level Home Access: Stairs to enter Entrance Stairs-Rails: Left Entrance Stairs-Number of Steps: 15 stairs down to basement level apartment with chair lift Bathroom Toilet: Handicapped height Bathroom Accessibility: Yes Additional Comments: This info pulled from recent stay at [facility]. Pt and and spouse live in a basement apartment in her daughter and son-in-law's house. Everything is very handicap accessible due to her husband also in a WC. They enter into their daughter's house and then do have to go down 16 steps to their apartment, but there is a chair lift. Pt uses the steps becuase she likes the exercise. They have a caregiver that comes every morning, but the  dtr stated they are adding more help and increase time to help them both if needed. They have life alert and multiple safety features. Pt used RW at baseline in the apratment but really liked to stay active and on her feet. Still does her banking, etc.  Discharge Living Setting Plans for Discharge Living Setting: House,Lives with (comment) (daughter and her spouse) Type of Home at Discharge: House Discharge Home Layout: Two level,Able to live on main level with bedroom/bathroom (has chair lift to get to second level) Alternate Level Stairs-Rails: None Alternate Level Stairs-Number of Steps: chair lift Discharge Home Access: Ramped entrance Discharge Bathroom Shower/Tub: Walk-in shower Discharge Bathroom Toilet: Standard Discharge Bathroom Accessibility: Yes How Accessible: Accessible via  wheelchair Does the patient have any problems obtaining your medications?: No  Social/Family/Support Systems Patient Roles: Spouse Contact Information: [name] (daughter): [phone] Anticipated Caregiver: daughter Anticipated Caregiver's Contact Information: see above Ability/Limitations of Caregiver: supervision Caregiver Availability: 24/7 Discharge Plan Discussed with Primary Caregiver: Yes (pt and daughter) Is Caregiver In Agreement with Plan?: Yes Does Caregiver/Family have Issues with Lodging/Transportation while Pt is in Rehab?: No   Goals Patient/Family Goal for Rehab: PT: Mod I/Supervision; OT: supervision; SLP: Supervision Expected length of stay: 14-18 days Pt/Family Agrees to Admission and willing to participate: Yes Program Orientation Provided & Reviewed with Pt/Caregiver Including Roles  & Responsibilities: Yes (pt and daughter)  Barriers to Discharge: Weight bearing restrictions (wrist in sling?)   Decrease burden of Care through IP rehab admission: Other NA   Possible need for SNF placement upon discharge:Not anticipated; pt has great social support from her family. Pt will have 24/7 Supervision at DC. Pt has been to CIR in the past and has done extremely well. Pt was active PTA and anticipate she can reach a Supervision level through CIR stay. Pt has a very accessible home.    Patient Condition: This patient's medical and functional status has changed since the consult dated: 08/19/20 in which the Rehabilitation Physician determined and documented that the patient's condition is appropriate for intensive rehabilitative care in an inpatient rehabilitation facility. See "History of Present Illness" (above) for medical update. Functional changes are:, improvement in gait from being unable to Min A for 18 feet; pt has also been evaluated by OT service with recommendation for CIR. Patient's medical and functional status update has been discussed with the Rehabilitation  physician and patient remains appropriate for inpatient rehabilitation. Will admit to inpatient rehab today.  Preadmission Screen Completed By:  [name], OT, 08/26/2020 11:21 AM ______________________________________________________________________   Discussed status with Dr. Naaman Plummer on 08/26/20 at 10:59AM and received approval for admission today.  Admission Coordinator:  [name], time: 10:59AM/Date 08/26/20

## 2020-08-19 NOTE — Progress Notes (Signed)
RT in to see pt for PRN tx after speaking with pt's primary RN. Pt asleep when RT entered the room, and did appear a little tachypneic but comfortable. Pt with clear bbs, but did feel warm to the touch. Pt given tx, but no change noted. Pt appearing anxious post tx, asking to not wear the [place] anymore. Pt currently on RA, stating, "she isn't ready" several times over. RN notified. RT will continue to monitor.

## 2020-08-19 NOTE — Progress Notes (Signed)
Inpatient Rehabilitation-Admissions Coordinator   CIR consult received.  Noted pt is observation status at this time. Pt may not have the medical necessity to warrant an inpatient rehab stay if they remain observation-it is also unlikely insurance will approve if pt is observation. If status were to change to inpatient, [facility] will screen for candidacy.   Cheri Rous, OTR/L  Rehab Admissions Coordinator  [phone] 08/19/2020 9:06 AM

## 2020-08-19 NOTE — Progress Notes (Signed)
Inpatient Rehabilitation-Admissions Coordinator   Met with pt at the bedside as follow up from PM&R consult. Please see consult note by Dr. Ranell Patrick for details. Discussed recommended rehab program with pt. She showing some slight confusion but gave me permission to speak to her daughter about the program and to confirm DC support. Spoke with her daughter who is in favor of CIR and confirmed 24/7 Supervision. AC will begin insurance auth process for possible admission.   [name], OTR/L  Rehab Admissions Coordinator  [phone] 08/19/2020 4:28 PM

## 2020-08-19 NOTE — Progress Notes (Signed)
Upon patients assessment patient stated that she didn't feel right and felt something was wrong but couldn't really express what it was. Patient alert to self only. Patient stated she had 0 pain, heart and lung sounds clear, but patient did appear like she was working a little harder to breath. O2 was 96% on room air. Placed patient on 2L O2 just for comfort to see if that helps. Patient was also given Tylenol 650 mg po for temp of 100.63F and Hydralazine 25 mg po for blood pressure of 153/69. Paged MD Sharlet Salina on call to inform of patients status. Orders placed and told to call back if interventions doesn't work for patient. Will continue to monitor.

## 2020-08-19 NOTE — Progress Notes (Signed)
Physical Therapy Treatment Patient Details Name: [name] MRN: [id number] DOB: 1918-07-10 Today's Date: 08/19/2020    History of Present Illness Patient  86 year old female with history of right temporal intracranial hemorrhage with hypertensive crisis in 2016, hypertension, macular degeneration, meningioma, paroxysmal atrial fibrillation, stable amyloid angiopathy, hyperlipidemia, CKD stage III Discharged from [facility] on 12/23 after a short stay for  subarachnoid hemorrhage in the sulcus of the left frontal lobe as well as subdural hemorrhage over the left frontoparietal convexity measuring 4 cm in maximum thickness along with radial head fracture. Pt returned to [facility] on 12/25. CT scan showed 2 mm increasing SAH a new 3 mm midline shift compared to prior imaging.    PT Comments    Patient continues to be limited by high anxiety. Patient making progress towards physical therapy goals. Patient requires max encouragement to complete activities. Patient required modA+2 for sit to stand with HHAx2. Patient ambulated 5' with HHAx2 and minA, chair follow for safety. Patient is also limited by decreased activity tolerance, generalized weakness, impaired balance. Continue to recommend comprehensive inpatient rehab (CIR) for post-acute therapy needs.    Follow Up Recommendations  CIR;Supervision for mobility/OOB     Equipment Recommendations  Wheelchair (measurements PT);Wheelchair cushion (measurements PT)    Recommendations for Other Services       Precautions / Restrictions Precautions Precautions: Fall Precaution Comments: Sling to immobilize LUE, no formal NWB status at this time. Hx of left wrist fracture - splint from home applied to left wrist Required Braces or Orthoses: Sling Restrictions Weight Bearing Restrictions: Yes LUE Weight Bearing: Non weight bearing Other Position/Activity Restrictions: L UE: no order however keeping NWB due to sling and shoulder pain    Mobility  Bed  Mobility               General bed mobility comments: received sitting in recliner  Transfers Overall transfer level: Needs assistance Equipment used: 2 person hand held assist Transfers: Sit to/from Stand Sit to Stand: Mod assist;+2 safety/equipment         General transfer comment: sit to stand x 2 with modA+2 for initial trial and maxA+1 for second trial  Ambulation/Gait Ambulation/Gait assistance: Min assist Gait Distance (Feet): 5 Feet Assistive device: 2 person hand held assist (face to face, chair follow and physical assist provided by +2) Gait Pattern/deviations: Step-to pattern;Decreased step length - left;Decreased stride length;Decreased weight shift to left;Trunk flexed;Wide base of support     General Gait Details: patient anxious to ambulate but with encouragement and visual target, patient completed 5' ambulation with HHAx2 and minA   Stairs             Wheelchair Mobility    Modified Rankin (Stroke Patients Only)       Balance Overall balance assessment: Needs assistance Sitting-balance support: Single extremity supported Sitting balance-Leahy Scale: Poor Sitting balance - Comments: reliant on R UE support   Standing balance support: Bilateral upper extremity supported Standing balance-Leahy Scale: Poor Standing balance comment: reliant on external support                            Cognition Arousal/Alertness: Awake/alert Behavior During Therapy: Anxious Overall Cognitive Status: Impaired/Different from baseline Area of Impairment: Following commands;Awareness;Memory;Attention                 Orientation Level: Place;Time Current Attention Level: Selective Memory: Decreased short-term memory Following Commands: Follows one step commands with increased time  Awareness: Emergent   General Comments: Patient with increasing anxiety which hinders memory/attention to tasks      Exercises General Exercises - Lower  Extremity Long Arc Quad: AROM;Both;10 reps Hip Flexion/Marching: AROM;Both;10 reps    General Comments General comments (skin integrity, edema, etc.): Patient's daughter entered at end of session. Discussed with her about patient's progress and need for rehab      Pertinent Vitals/Pain Pain Assessment: Faces Faces Pain Scale: Hurts even more Pain Location: left shoulder Pain Descriptors / Indicators: Grimacing;Guarding Pain Intervention(s): Monitored during session    Home Living Family/patient expects to be discharged to:: Private residence Living Arrangements: Spouse/significant other;Children Available Help at Discharge: Family;Personal care attendant Type of Home: House Home Access: Stairs to enter Entrance Stairs-Rails: Left Home Layout: One level Home Equipment: Grab bars - tub/shower;Shower seat - built in;Grab bars - toilet;Marketa Midkiff - 2 wheels;Wheelchair - Education administrator (comment);Hand held shower head;Stokes Rattigan - 4 wheels Additional Comments: This info pulled from recent stay at [facility]. Pt and and spouse live in a basement apartment in her daughter and son-in-law's house. Everything is very handicap accessible due to her husband also in a WC. They enter into their daughter's house and then do have to go down 16 steps to their apartment, but there is a chair lift. Pt uses the steps becuase she likes the exercise. They have a caregiver that comes every morning, but the dtr stated they are adding more help and increase time to help them both if needed. They have life alert and multiple safety features. Pt used RW at baseline in the apratment but really liked to stay active and on her feet. Still does her banking, etc.    Prior Function Level of Independence: Independent with assistive device(s)      Comments: Pt ambualtes with a RW at home, and reports being independent with all ADLs prior to recent fall   PT Goals (current goals can now be found in the care plan section) Acute Rehab PT  Goals Patient Stated Goal: to return home PT Goal Formulation: With patient/family Time For Goal Achievement: 09/02/20 Potential to Achieve Goals: Fair Progress towards PT goals: Progressing toward goals    Frequency    Min 3X/week      PT Plan Current plan remains appropriate    Co-evaluation              AM-PAC PT "6 Clicks" Mobility   Outcome Measure  Help needed turning from your back to your side while in a flat bed without using bedrails?: A Lot Help needed moving from lying on your back to sitting on the side of a flat bed without using bedrails?: A Lot Help needed moving to and from a bed to a chair (including a wheelchair)?: A Lot Help needed standing up from a chair using your arms (e.g., wheelchair or bedside chair)?: A Lot Help needed to walk in hospital room?: A Lot Help needed climbing 3-5 steps with a railing? : Total 6 Click Score: 11    End of Session Equipment Utilized During Treatment: Gait belt Activity Tolerance: Patient limited by pain;Other (comment) (limited by anxiety) Patient left: in chair;with call bell/phone within reach;with chair alarm set;with family/visitor present Nurse Communication: Mobility status PT Visit Diagnosis: Unsteadiness on feet (R26.81)     Time: [id number] PT Time Calculation (min) (ACUTE ONLY): 26 min  Charges:  $Therapeutic Activity: 23-37 mins  Perrin Maltese, PT, DPT Acute Rehabilitation Services Pager [phone] Office [phone]    Alda Lea 08/19/2020, 10:25 AM

## 2020-08-19 NOTE — Evaluation (Signed)
Occupational Therapy Evaluation Patient Details Name: [name] MRN: [id number] DOB: 09/18/1917 Today's Date: 08/19/2020    History of Present Illness Patient  86 year old female with history of right temporal intracranial hemorrhage with hypertensive crisis in 2016, hypertension, macular degeneration, meningioma, paroxysmal atrial fibrillation, stable amyloid angiopathy, hyperlipidemia, CKD stage III Discharged from [facility] on 12/23 after a short stay for  subarachnoid hemorrhage in the sulcus of the left frontal lobe as well as subdural hemorrhage over the left frontoparietal convexity measuring 4 cm in maximum thickness along with radial head fracture. [name] returned to [facility] on 12/25. CT scan showed 2 mm increasing SAH a new 3 mm midline shift compared to prior imaging.   Clinical Impression   PTA, [name] lives with spouse in basement apartment of daughter's home. [name] typically Modified Independent with ADLs and mobility using RW. [name] presents now with deficits in strength, standing balance, endurance, impaired use of L UE and cognition. [name] limited by pain and anxiety during session. Increased time needed for sit to stand attempt due to anxiety - overall max A for sit to stand with handheld assist. [name] unable to progress to taking steps due to pain in L knee/L shoulder with 1 person assist. [name] overall Min A for simple grooming tasks, Max A for UB ADLs and Total A for LB ADLs due to deficits. Family interested in [place] for therapy services. Highly recommend postacute rehab services to decrease fall risk, maximize independence in ADLs and improve confidence with mobility.     Follow Up Recommendations  CIR;Supervision/Assistance - 24 hour (will need SNF if unable to go to CIR)    Equipment Recommendations  Other (comment) (to be determined pending progress)    Recommendations for Other Services Rehab consult     Precautions / Restrictions Precautions Precautions: Fall Precaution Comments: Sling to  immobilize LUE, no formal NWB status at this time. Hx of left wrist fracture - splint from home applied to left wrist Required Braces or Orthoses: Sling Restrictions Weight Bearing Restrictions: Yes LUE Weight Bearing: Non weight bearing Other Position/Activity Restrictions: L UE: no order however keeping NWB due to sling and shoulder pain      Mobility Bed Mobility               General bed mobility comments: received sitting in recliner    Transfers Overall transfer level: Needs assistance Equipment used: 1 person hand held assist Transfers: Sit to/from Stand Sit to Stand: Max assist         General transfer comment: increased time to attempt sit to stand due to anxiety. Max A with manual transfer from therapist. Mod A to maintain standing balance. [name] reporting increased knee pain in standing and reported need to sit    Balance Overall balance assessment: Needs assistance Sitting-balance support: Single extremity supported Sitting balance-Leahy Scale: Poor Sitting balance - Comments: reliant on R UE support   Standing balance support: Single extremity supported Standing balance-Leahy Scale: Poor Standing balance comment: reliant on external support                           ADL either performed or assessed with clinical judgement   ADL Overall ADL's : Needs assistance/impaired Eating/Feeding: Set up;Sitting Eating/Feeding Details (indicate cue type and reason): Setup to open containers, prepare coffee Grooming: Sitting;Minimal assistance Grooming Details (indicate cue type and reason): will be MIn A for bimanual tasks. able to wash face with setup, don glassess with  one hand. assistance needed to clean glasses Upper Body Bathing: Maximal assistance;Sitting   Lower Body Bathing: Maximal assistance;Sit to/from stand;Sitting/lateral leans   Upper Body Dressing : Maximal assistance;Sitting   Lower Body Dressing: Total assistance;Sit to/from stand        Toileting- Water quality scientist and Hygiene: Total assistance;Sit to/from stand;Sitting/lateral lean         General ADL Comments: [name] limited by pain, anxiety and decreased standing balance     Vision Baseline Vision/History: Macular Degeneration;Wears glasses Wears Glasses: At all times Patient Visual Report: No change from baseline Vision Assessment?: No apparent visual deficits     Perception     Praxis      Pertinent Vitals/Pain Pain Assessment: Faces Faces Pain Scale: Hurts whole lot Pain Location: left shoulder Pain Descriptors / Indicators: Grimacing;Guarding Pain Intervention(s): Monitored during session;Repositioned     Hand Dominance Right   Extremity/Trunk Assessment Upper Extremity Assessment Upper Extremity Assessment: RUE deficits/detail;LUE deficits/detail RUE Deficits / Details: Grossly 4-/5 LUE Deficits / Details: L UE in sling. reports of more pain in shoulder than in elbow. wrist, hand elbow ROM WFL. LUE: Unable to fully assess due to immobilization;Unable to fully assess due to pain LUE Sensation: WNL LUE Coordination: decreased gross motor   Lower Extremity Assessment Lower Extremity Assessment: Defer to [name] evaluation   Cervical / Trunk Assessment Cervical / Trunk Assessment: Kyphotic   Communication Communication Communication: HOH   Cognition Arousal/Alertness: Awake/alert Behavior During Therapy: Anxious Overall Cognitive Status: Impaired/Different from baseline Area of Impairment: Orientation;Following commands;Awareness;Memory;Attention                 Orientation Level: Place;Time Current Attention Level: Selective Memory: Decreased short-term memory Following Commands: Follows one step commands with increased time   Awareness: Emergent   General Comments: [name] with increasing anxiety which hinders memory/attention to tasks and awareness. [name] initially reports events prior to fall and "waking up in hospital" then when  becoming anxious, asks "where am I?" throughout   General Comments  [name] anxious required calming techniques, reorienting to situation and use of one step commands to break down tasks    Exercises     Shoulder Instructions      Home Living Family/patient expects to be discharged to:: Private residence Living Arrangements: Spouse/significant other;Children Available Help at Discharge: Family;Personal care attendant Type of Home: House Home Access: Stairs to enter [employer] of Steps: 15 stairs down to basement level apartment with chair lift Entrance Stairs-Rails: Left Home Layout: One level         Bathroom Toilet: Handicapped height Bathroom Accessibility: Yes   Home Equipment: Grab bars - tub/shower;Shower seat - built in;Grab bars - toilet;Walker - 2 wheels;Wheelchair - Education administrator (comment);Hand held shower head;Walker - 4 wheels   Additional Comments: This info pulled from recent stay at [facility]. [name] and and spouse live in a basement apartment in her daughter and son-in-law's house. Everything is very handicap accessible due to her husband also in a WC. They enter into their daughter's house and then do have to go down 16 steps to their apartment, but there is a chair lift. [name] uses the steps becuase she likes the exercise. They have a caregiver that comes every morning, but the dtr stated they are adding more help and increase time to help them both if needed. They have life alert and multiple safety features. [name] used RW at baseline in the apratment but really liked to stay active and on her feet. Still does her banking,  etc.      Prior Functioning/Environment Level of Independence: Independent with assistive device(s)        Comments: [name] ambualtes with a RW at home, and reports being independent with all ADLs prior to recent fall        OT Problem List: Decreased strength;Impaired UE functional use;Pain;Decreased activity tolerance;Decreased safety  awareness;Impaired balance (sitting and/or standing);Decreased knowledge of use of DME or AE;Decreased knowledge of precautions;Decreased range of motion;Decreased cognition;Decreased coordination      OT Treatment/Interventions: Self-care/ADL training;Therapeutic exercise;Therapeutic activities;Cognitive remediation/compensation;Energy conservation;DME and/or AE instruction;Patient/family education;Balance training;Manual therapy;Modalities    OT Goals(Current goals can be found in the care plan section) Acute Rehab OT Goals Patient Stated Goal: return home, pain control OT Goal Formulation: With patient Time For Goal Achievement: 09/02/20 Potential to Achieve Goals: Good ADL Goals [name] Will Perform Grooming: with supervision;standing [name] Will Perform Upper Body Bathing: sitting;with min assist [name] Will Perform Lower Body Bathing: with supervision;sit to/from stand [name] Will Perform Upper Body Dressing: with min assist;sitting [name] Will Perform Lower Body Dressing: with supervision;sitting/lateral leans;sit to/from stand [name] Will Transfer to Toilet: with min guard assist;stand pivot transfer;bedside commode  OT Frequency: Min 2X/week   Barriers to D/C:            Co-evaluation              AM-PAC OT "6 Clicks" Daily Activity     Outcome Measure Help from another person eating meals?: A Little Help from another person taking care of personal grooming?: A Little Help from another person toileting, which includes using toliet, bedpan, or urinal?: Total Help from another person bathing (including washing, rinsing, drying)?: A Lot Help from another person to put on and taking off regular upper body clothing?: A Lot Help from another person to put on and taking off regular lower body clothing?: Total 6 Click Score: 12   End of Session Equipment Utilized During Treatment: Gait belt  Activity Tolerance: Other (comment);Patient limited by pain (limited by anxiety) Patient left: in  chair;with call bell/phone within reach;with chair alarm set  OT Visit Diagnosis: Unsteadiness on feet (R26.81);History of falling (Z91.81);Muscle weakness (generalized) (M62.81);Pain Pain - Right/Left: Left Pain - part of body: Shoulder;Arm;Hand                Time: [id number] OT Time Calculation (min): 31 min Charges:  OT General Charges $OT Visit: 1 Visit OT Evaluation $OT Eval Moderate Complexity: 1 Mod OT Treatments $Self Care/Home Management : 8-22 mins  Layla Maw, OTR/L  Layla Maw 08/19/2020, 9:34 AM

## 2020-08-20 ENCOUNTER — Ambulatory Visit: Payer: Medicare Other | Admitting: Podiatry

## 2020-08-20 DIAGNOSIS — N289 Disorder of kidney and ureter, unspecified: Secondary | ICD-10-CM | POA: Diagnosis not present

## 2020-08-20 DIAGNOSIS — R4182 Altered mental status, unspecified: Secondary | ICD-10-CM | POA: Diagnosis not present

## 2020-08-20 MED ORDER — SODIUM CHLORIDE 0.9 % IV SOLN: INTRAVENOUS | Status: AC

## 2020-08-20 NOTE — Progress Notes (Signed)
[facility]  PROGRESS NOTE  [name] [id number] DOB: [birth year] DOA: 08/17/2020 PCP: Marin Olp, MD   Brief HPI:   86 year old female with recent [place], hypertension, hyperlipidemia, macular degeneration of left eye, ICH in 2016 was admitted on 08/11/2020 and discharged on 12/24 after a fall.  During that hospital stay CT head showed subarachnoid hemorrhage in the sulcus of left frontal lobe as well as subdural hemorrhage over the left frontoparietal convexity measuring 4 cm in maximal thickness.  Neurosurgery was consulted and recommended conservative management.  She was also found to have left elbow  fracture and orthopedics recommended sling and outpatient follow-up.  Physical therapy recommended skilled nursing facility however patient family took her home.  She was home for less than 24 hours and had persistent sleepiness, lethargy and confusion compared to baseline and she was brought back to ER.  During hospital stay she was given oxycodone and became lethargic and had worsening delirium.  She was discharged home with tramadol.  In the ED at [facility] CT scan showed 2 mm increased SAH and 3 mm midline shift compared to prior imaging, neurosurgery was consulted and recommended no surgical intervention but close monitoring was recommended.   Subjective   Patient seen and examined, she is complaining of feeling tired but denies having any major symptoms at this time.   Assessment/Plan:    1. Acute metabolic encephalopathy-encephalopathy has almost resolved, likely from opioids and tramadol at home.  Patient is back to baseline.  PT recommended CIR.  Awaiting bed at CIR. 2. Traumatic SAH, SDH-due to fall that prompted her first hospitalization.  Repeat CT head showed midline shift of 2 mm at most.  Neurosurgery was consulted by previous hospitalist who recommended no acute intervention and recommended repeat CT head in 2 weeks. 3. Acute kidney injury- ARB on  hold.  Creatinine improved but now worsening again.  Will order gentle IV hydration.  Continue to monitor. 4. Left elbow fracture-continue sling, orthopedic surgery to follow-up as outpatient 5. Hypertension-blood pressure is stable, continue multipin.  ARB on hold due to worsening renal function. 6. Hyperlipidemia-continue atorvastatin  COVID-19 Labs  No results for input(s): DDIMER, FERRITIN, LDH, CRP in the last 72 hours.  Lab Results  Component Value Date   [place] NEGATIVE 08/12/2020     Scheduled medications:   . amLODipine  5 mg Oral Daily  . atorvastatin  40 mg Oral QHS  . lidocaine  1 patch Transdermal Q24H  . multivitamin with minerals  1 tablet Oral Daily   CBG: No results for input(s): GLUCAP in the last 168 hours.  SpO2: 98 % O2 Flow Rate (L/min): 2 L/min FiO2 (%): 21 %    CBC: Recent Labs  Lab 08/15/20 0751 08/16/20 0810 08/18/20 0338 08/19/20 0213  WBC 11.3* 12.5* 9.4 9.1  NEUTROABS 7.8* 9.6*  --   --   HGB 11.7* 11.6* 11.4* 10.8*  HCT 36.0 36.1 36.2 32.4*  MCV 89.8 90.3 90.7 85.7  PLT 164 159 184 [id number]    Basic Metabolic Panel: Recent Labs  Lab 08/16/20 0810 08/18/20 0338 08/19/20 0213  NA 138 138 136  K 4.4 3.9 3.7  CL 102 104 102  CO2 26 21* 23  GLUCOSE 126* 116* 122*  BUN 33* 19 22  CREATININE 1.18* 0.87 1.16*  CALCIUM 8.4* 8.3* 8.1*      Antibiotics: Anti-infectives (From admission, onward)   None       DVT prophylaxis: SCDs  Code Status: Full code  Family Communication:  No family at bedside   Consultants:    Procedures:      Objective   Vitals:   08/19/20 0514 08/19/20 1452 08/19/20 2010 08/20/20 0530  BP: (!) 155/68 139/60 (!) 153/69 (!) 155/66  Pulse: 64 69 73 64  Resp: 17 17 16 17   Temp: 98 F (36.7 C) 99.2 F (37.3 C) (!) 100.4 F (38 C) 98.4 F (36.9 C)  TempSrc: Oral Oral Oral Oral  SpO2: 95% 99% 96% 98%    Intake/Output Summary (Last 24 hours) at 08/20/2020 0859 Last data filed at  08/20/2020 0531 Gross per 24 hour  Intake 360 ml  Output 550 ml  Net -190 ml    12/26 1901 - 12/28 0700 In: 360 [P.O.:360] Out: 850 [Urine:850]  There were no vitals filed for this visit.  Physical Examination:    General-appears in no acute distress  Heart-S1-S2, regular, no murmur auscultated  Lungs-clear to auscultation bilaterally, no wheezing or crackles auscultated  Abdomen-soft, nontender, no organomegaly  Extremities-no edema in the lower extremities  Neuro-alert, oriented x3, no focal deficit noted   Status is: Inpatient  Dispo: The patient is from: Home              Anticipated d/c is to: CIR              Anticipated d/c date is: 08/20/2020              Patient currently medically stable for discharge  Barrier to discharge-awaiting bed at [place]:   Recent Results (from the past 240 hour(s))  Resp Panel by RT-PCR (Flu A&B, Covid) Nasopharyngeal Swab     Status: None   Collection Time: 08/12/20  6:28 AM   Specimen: Nasopharyngeal Swab; Nasopharyngeal(NP) swabs in vial transport medium  Result Value Ref Range Status   SARS Coronavirus 2 by RT PCR NEGATIVE NEGATIVE Final    Comment: (NOTE) SARS-CoV-2 target nucleic acids are NOT DETECTED.  The SARS-CoV-2 RNA is generally detectable in upper respiratory specimens during the acute phase of infection. The lowest concentration of SARS-CoV-2 viral copies this assay can detect is 138 copies/mL. A negative result does not preclude SARS-Cov-2 infection and should not be used as the sole basis for treatment or other patient management decisions. A negative result may occur with  improper specimen collection/handling, submission of specimen other than nasopharyngeal swab, presence of viral mutation(s) within the areas targeted by this assay, and inadequate number of viral copies(<138 copies/mL). A negative result must be combined with clinical observations, patient history, and  epidemiological information. The expected result is Negative.  Fact Sheet for Patients:  EntrepreneurPulse.com.au  Fact Sheet for Healthcare Providers:  IncredibleEmployment.be  This test is no t yet approved or cleared by the [place] FDA and  has been authorized for detection and/or diagnosis of SARS-CoV-2 by FDA under an Emergency Use Authorization (EUA). This EUA will remain  in effect (meaning this test can be used) for the duration of the COVID-19 declaration under Section 564(b)(1) of the Act, 21 U.S.C.section 360bbb-3(b)(1), unless the authorization is terminated  or revoked sooner.       Influenza A by PCR NEGATIVE NEGATIVE Final   Influenza B by PCR NEGATIVE NEGATIVE Final    Comment: (NOTE) The Xpert Xpress SARS-CoV-2/FLU/RSV plus assay is intended as an aid in the diagnosis of influenza from Nasopharyngeal swab specimens and should not be used as a sole basis for treatment.  Nasal washings and aspirates are unacceptable for Xpert Xpress SARS-CoV-2/FLU/RSV testing.  Fact Sheet for Patients: BloggerCourse.com  Fact Sheet for Healthcare Providers: SeriousBroker.it  This test is not yet approved or cleared by the [place] FDA and has been authorized for detection and/or diagnosis of SARS-CoV-2 by FDA under an Emergency Use Authorization (EUA). This EUA will remain in effect (meaning this test can be used) for the duration of the COVID-19 declaration under Section 564(b)(1) of the Act, 21 U.S.C. section 360bbb-3(b)(1), unless the authorization is terminated or revoked.  Performed at [facility], [address] [place], [place] [address]     No results for input(s): LIPASE, AMYLASE in the last 168 hours. No results for input(s): AMMONIA in the last 168 hours.  Cardiac Enzymes: Recent Labs  Lab 08/17/20 1430  CKTOTAL 36*   BNP (last 3  results) No results for input(s): BNP in the last 8760 hours.  ProBNP (last 3 results) No results for input(s): PROBNP in the last 8760 hours.  Studies:  No results found.    Vonzella Nipple, MD  [facility] If 7PM-7AM, please contact night-coverage at [email or web address], Office  [phone]   08/20/2020, 8:59 AM  LOS: 1 day

## 2020-08-21 DIAGNOSIS — R4182 Altered mental status, unspecified: Secondary | ICD-10-CM | POA: Diagnosis not present

## 2020-08-21 LAB — BASIC METABOLIC PANEL
Anion gap: 10 (ref 5–15)
BUN: 17 mg/dL (ref 8–23)
CO2: 23 mmol/L (ref 22–32)
Calcium: 8.2 mg/dL — ABNORMAL LOW (ref 8.9–10.3)
Chloride: 106 mmol/L (ref 98–111)
Creatinine, Ser: 0.82 mg/dL (ref 0.44–1.00)
GFR, Estimated: >60 mL/min (ref >60)
Glucose, Bld: 118 mg/dL — ABNORMAL HIGH (ref 70–99)
Potassium: 3.7 mmol/L (ref 3.5–5.1)
Sodium: 139 mmol/L (ref 135–145)

## 2020-08-21 NOTE — Progress Notes (Signed)
Physical Therapy Treatment Patient Details Name: [name] MRN: [id number] DOB: 1918-01-01 Today's Date: 08/21/2020    History of Present Illness Patient  86 year old female with history of right temporal intracranial hemorrhage with hypertensive crisis in 2016, hypertension, macular degeneration, meningioma, paroxysmal atrial fibrillation, stable amyloid angiopathy, hyperlipidemia, CKD stage III Discharged from [facility] on 12/23 after a short stay for  subarachnoid hemorrhage in the sulcus of the left frontal lobe as well as subdural hemorrhage over the left frontoparietal convexity measuring 4 cm in maximum thickness along with radial head fracture. Pt returned to [facility] on 12/25. CT scan showed 2 mm increasing SAH a new 3 mm midline shift compared to prior imaging.    PT Comments    Patient progressing towards physical therapy goals. Patient requires max encouragement to initiate ambulation, cues required to "don't think, just walk" with good follow through. Patient ambulated x6' and x12' with HHAx2 (face to face) and minA. Patient continues to be limited by decreased activity tolerance, impaired balance, generalized weakness, and fear of falling. Continue to recommend comprehensive inpatient rehab (CIR) for post-acute therapy needs.    Follow Up Recommendations  CIR;Supervision for mobility/OOB     Equipment Recommendations  Wheelchair (measurements PT);Wheelchair cushion (measurements PT)    Recommendations for Other Services       Precautions / Restrictions Precautions Precautions: Fall Precaution Comments: Sling to immobilize LUE, no formal NWB status at this time. Hx of left wrist fracture - splint from home applied to left wrist Required Braces or Orthoses: Sling Restrictions Weight Bearing Restrictions: Yes LUE Weight Bearing: Non weight bearing Other Position/Activity Restrictions: L UE: no order however keeping NWB due to sling and shoulder pain    Mobility  Bed  Mobility               General bed mobility comments: received sitting in recliner  Transfers Overall transfer level: Needs assistance Equipment used: 2 person hand held assist Transfers: Sit to/from Stand Sit to Stand: Mod assist;+2 safety/equipment         General transfer comment: sit to stand x 2 with modA+2  Ambulation/Gait Ambulation/Gait assistance: Min assist Gait Distance (Feet): 18 Feet (x 6', x 12') Assistive device: 2 person hand held assist (face to face, chair follow and physical assist provided by +2) Gait Pattern/deviations: Step-to pattern;Decreased step length - left;Decreased stride length;Decreased weight shift to left;Trunk flexed;Wide base of support Gait velocity: decreased   General Gait Details: patient anxious to ambulate but with max encouragement and "don't think, just walk", patient able to ambulate to the sink in room with 2 person HHA. Continuous reminders to "don't think, just walk", with good follow through.   Stairs             Wheelchair Mobility    Modified Rankin (Stroke Patients Only)       Balance Overall balance assessment: Needs assistance Sitting-balance support: Single extremity supported Sitting balance-Leahy Scale: Poor Sitting balance - Comments: reliant on R UE support   Standing balance support: Bilateral upper extremity supported Standing balance-Leahy Scale: Poor Standing balance comment: reliant on external support                            Cognition Arousal/Alertness: Awake/alert Behavior During Therapy: Anxious Overall Cognitive Status: Impaired/Different from baseline Area of Impairment: Orientation;Memory;Following commands;Attention                 Orientation Level: Disoriented to;Place;Time;Situation Current Attention  Level: Selective Memory: Decreased short-term memory Following Commands: Follows one step commands with increased time              Exercises       General Comments        Pertinent Vitals/Pain Pain Assessment: Faces Faces Pain Scale: Hurts little more Pain Location: left shoulder Pain Descriptors / Indicators: Grimacing;Guarding Pain Intervention(s): Monitored during session    Home Living                      Prior Function            PT Goals (current goals can now be found in the care plan section) Acute Rehab PT Goals Patient Stated Goal: to return home PT Goal Formulation: With patient Time For Goal Achievement: 09/02/20 Potential to Achieve Goals: Fair Progress towards PT goals: Progressing toward goals    Frequency    Min 3X/week      PT Plan Current plan remains appropriate    Co-evaluation              AM-PAC PT "6 Clicks" Mobility   Outcome Measure  Help needed turning from your back to your side while in a flat bed without using bedrails?: A Lot Help needed moving from lying on your back to sitting on the side of a flat bed without using bedrails?: A Lot Help needed moving to and from a bed to a chair (including a wheelchair)?: A Lot Help needed standing up from a chair using your arms (e.g., wheelchair or bedside chair)?: A Lot Help needed to walk in hospital room?: A Lot Help needed climbing 3-5 steps with a railing? : Total 6 Click Score: 11    End of Session Equipment Utilized During Treatment: Gait belt;Other (comment) (sling on L UE) Activity Tolerance: Other (comment) (Limited by anxiety) Patient left: in chair;with call bell/phone within reach;with chair alarm set Nurse Communication: Mobility status PT Visit Diagnosis: Unsteadiness on feet (R26.81)     Time: 5732-2025 PT Time Calculation (min) (ACUTE ONLY): 25 min  Charges:  $Therapeutic Activity: 23-37 mins                     Gregor Hams, PT, DPT Acute Rehabilitation Services Pager [phone] Office [phone]    Zannie Kehr Allred 08/21/2020, 2:02 PM

## 2020-08-21 NOTE — H&P (Signed)
Physical Medicine and Rehabilitation Admission H&P     HPI: Adara Kittle is a 86 year old right-handed female with history of hypertension macular degeneration right temporal ICH secondary to hypertensive crisis 2016, amyloid angiopathy, CKD stage III, PAF.  Patient with recent left frontal lobe SAH left frontal parietal SDH after a fall with left wrist radial head fracture 08/11/2020 with recommendations by orthopedic services for sling and outpatient follow-up with Dr. Ophelia Charter and currently nonweightbearing.  She was discharged home 08/16/2020 from [facility] although skilled nursing facility was recommended but readmitted 08/17/2020 with ongoing confusion with lethargy and inability to care for herself.  Per chart review patient currently lives with spouse and daughter.  1 level home with 15 steps down to basement level apartment with chairlift.  They have a caregiver that comes in every morning but the daughter is adding more care in the home as needed.  They have a life alert and multiple safety features in the house.  Patient was using rolling walker at baseline prior to SDH.  Follow-up cranial CT scan showed increase in left parietal SAH and slight 2 mm rightward midline shift.  Lethargy felt to be multifactorial due to tramadol as well as acute on chronic renal failure.  Admission chemistries glucose 126 BUN 33 creatinine 1.18, hemoglobin 11.6 WBC 12,500, TSH 2.786, total CK 36, urinalysis negative nitrite.  Patient was placed on IV fluids for hydration with latest creatinine 0.82.  Neurosurgery consulted advised conservative care of traumatic SAH/SDH.  Blood pressure monitored.  ARB on hold due to renal insufficiency.  Findings of elevated LFTs Lipitor was held and continued to improve.  She is tolerating a regular diet.  Close monitoring pain control currently with Lidoderm patch tramadol has been discontinued.  Due to patient decrease in functional ability altered mental status was  admitted for a comprehensive rehab program.  Review of Systems  Constitutional: Negative for chills and fever.  HENT: Negative for hearing loss.   Eyes: Negative for blurred vision and double vision.       Macular degeneration  Respiratory: Negative for cough and shortness of breath.   Cardiovascular: Negative for chest pain, palpitations and leg swelling.  Gastrointestinal: Positive for constipation. Negative for heartburn, nausea and vomiting.  Genitourinary: Negative for dysuria, flank pain and hematuria.  Musculoskeletal: Positive for falls and myalgias.  Skin: Negative for rash.  Neurological: Positive for weakness.  All other systems reviewed and are negative.  Past Medical History:  Diagnosis Date  . Hypertension   . Macular degeneration of left eye   . Osteopenia   . Stroke [facility])    Past Surgical History:  Procedure Laterality Date  . CATARACT EXTRACTION     Family History  Problem Relation Age of Onset  . Cancer Daughter        breast   Social History:  reports that she has never smoked. She has never used smokeless tobacco. She reports current alcohol use of about 1.0 standard drink of alcohol per week. She reports that she does not use drugs. Allergies:  Allergies  Allergen Reactions  . Ace Inhibitors     REACTION: angioedema  . Other     Blood Thinners Cause Brain Bleeds   Medications Prior to Admission  Medication Sig Dispense Refill  . acetaminophen (TYLENOL) 500 MG tablet Take 1-2 tablets (500-1,000 mg total) by mouth every 6 (six) hours as needed for mild pain or moderate pain. 30 tablet 0  . amLODipine (NORVASC) 5 MG  tablet TAKE 1 TABLET BY MOUTH  DAILY 90 tablet 3  . atorvastatin (LIPITOR) 40 MG tablet TAKE 1 TABLET BY MOUTH AT  BEDTIME 90 tablet 3  . losartan (COZAAR) 100 MG tablet TAKE 1 TABLET BY MOUTH  DAILY (Patient taking differently: Take 100 mg by mouth at bedtime.) 90 tablet 3  . Multiple Vitamin (MULTIVITAMIN) tablet Take 1 tablet by mouth  daily.    . traMADol (ULTRAM) 50 MG tablet Take 1 tablet (50 mg total) by mouth every 12 (twelve) hours as needed for severe pain. (Patient not taking: No sig reported) 6 tablet 0    Drug Regimen Review Drug regimen was reviewed and remains appropriate with no significant issues identified   Home: Home Living Family/patient expects to be discharged to:: Private residence Living Arrangements: Spouse/significant other,Children Available Help at Discharge: Family,Personal care attendant Type of Home: House Home Access: Stairs to enter [employer] of Steps: 15 stairs down to basement level apartment with chair lift Entrance Stairs-Rails: Left Home Layout: One level Bathroom Toilet: Handicapped height Bathroom Accessibility: Yes Home Equipment: Grab bars - tub/shower,Shower seat - built in,Grab bars - toilet,Walker - 2 wheels,Wheelchair - manual,Other (comment),Hand held shower head,Walker - 4 wheels Additional Comments: This info pulled from recent stay at [facility]. Pt and and spouse live in a basement apartment in her daughter and son-in-law's house. Everything is very handicap accessible due to her husband also in a WC. They enter into their daughter's house and then do have to go down 16 steps to their apartment, but there is a chair lift. Pt uses the steps becuase she likes the exercise. They have a caregiver that comes every morning, but the dtr stated they are adding more help and increase time to help them both if needed. They have life alert and multiple safety features. Pt used RW at baseline in the apratment but really liked to stay active and on her feet. Still does her banking, etc.   Functional History: Prior Function Level of Independence: Independent with assistive device(s) Comments: Pt ambualtes with a RW at home, and reports being independent with all ADLs prior to recent fall  Functional Status:  Mobility: Bed Mobility Overal bed mobility: Needs Assistance Bed  Mobility: Sit to Supine Supine to sit: Mod assist,HOB elevated,+2 for physical assistance (due to pt's pain and anxiety) General bed mobility comments: received sitting in recliner Transfers Overall transfer level: Needs assistance Equipment used: 2 person hand held assist Transfers: Sit to/from Stand Sit to Stand: Mod assist,+2 safety/equipment Stand pivot transfers: Mod assist,+2 safety/equipment General transfer comment: sit to stand x 2 with modA+2 for initial trial and maxA+1 for second trial Ambulation/Gait Ambulation/Gait assistance: Min assist Gait Distance (Feet): 5 Feet Assistive device: 2 person hand held assist (face to face, chair follow and physical assist provided by +2) Gait Pattern/deviations: Step-to pattern,Decreased step length - left,Decreased stride length,Decreased weight shift to left,Trunk flexed,Wide base of support General Gait Details: patient anxious to ambulate but with encouragement and visual target, patient completed 5' ambulation with HHAx2 and minA    ADL: ADL Overall ADL's : Needs assistance/impaired Eating/Feeding: Set up,Sitting Eating/Feeding Details (indicate cue type and reason): Setup to open containers, prepare coffee Grooming: Sitting,Minimal assistance Grooming Details (indicate cue type and reason): will be MIn A for bimanual tasks. able to wash face with setup, don glassess with one hand. assistance needed to clean glasses Upper Body Bathing: Maximal assistance,Sitting Lower Body Bathing: Maximal assistance,Sit to/from stand,Sitting/lateral leans Upper Body Dressing : Maximal  assistance,Sitting Lower Body Dressing: Total assistance,Sit to/from stand Toileting- Water quality scientist and Hygiene: Total assistance,Sit to/from stand,Sitting/lateral lean General ADL Comments: Pt limited by pain, anxiety and decreased standing balance  Cognition: Cognition Overall Cognitive Status: Impaired/Different from baseline Orientation Level: Oriented  to person,Oriented to place,Oriented to situation,Disoriented to time Cognition Arousal/Alertness: Awake/alert Behavior During Therapy: Anxious Overall Cognitive Status: Impaired/Different from baseline Area of Impairment: Following commands,Awareness,Memory,Attention Orientation Level: Place,Time Current Attention Level: Selective Memory: Decreased short-term memory Following Commands: Follows one step commands with increased time Awareness: Emergent General Comments: Patient with increasing anxiety which hinders memory/attention to tasks  Physical Exam: Blood pressure (!) 141/63, pulse 64, temperature 98.1 F (36.7 C), temperature source Oral, resp. rate 16, SpO2 97 %. Physical Exam Constitutional:      General: She is not in acute distress. HENT:     Right Ear: External ear normal.     Left Ear: External ear normal.     Mouth/Throat:     Mouth: Mucous membranes are moist.  Eyes:     Pupils: Pupils are equal, round, and reactive to light.  Cardiovascular:     Rate and Rhythm: Normal rate and regular rhythm.  Pulmonary:     Effort: Pulmonary effort is normal.     Breath sounds: Normal breath sounds.  Musculoskeletal:     Cervical back: Normal range of motion.     Comments: LUE tender, in sling. Both calves sl TTP  Skin:    General: Skin is warm.  Neurological:     Comments: Patient is alert pleasant no acute distress makes eye contact with examiner.  No focal CN abnl. Provides her name and age and follows simple commands. Fair to borderline insight and awareness. Moves all 4 with limitations in LUE d/t ortho. Sensation intact     Results for orders placed or performed during the hospital encounter of 08/17/20 (from the past 48 hour(s))  Basic metabolic panel     Status: Abnormal   Collection Time: 08/21/20  1:32 AM  Result Value Ref Range   Sodium 139 135 - 145 mmol/L   Potassium 3.7 3.5 - 5.1 mmol/L   Chloride 106 98 - 111 mmol/L   CO2 23 22 - 32 mmol/L   Glucose,  Bld 118 (H) 70 - 99 mg/dL    Comment: Glucose reference range applies only to samples taken after fasting for at least 8 hours.   BUN 17 8 - 23 mg/dL   Creatinine, Ser 0.82 0.44 - 1.00 mg/dL   Calcium 8.2 (L) 8.9 - 10.3 mg/dL   GFR, Estimated >60 >60 mL/min    Comment: (NOTE) Calculated using the CKD-EPI Creatinine Equation (2021)    Anion gap 10 5 - 15    Comment: Performed at [facility] [address] [place], [place] [address]   No results found.     Medical Problem List and Plan: 1.  Decreased functional build altered mental status secondary to traumatic SDH/SAH/acute metabolic encephalopathy  -patient may  shower  -ELOS/Goals: mod I to supervision PT, supervision OT and SLP 2.  Antithrombotics: -DVT/anticoagulation: SCDs, dopplers pending today (has had calf pain)  -antiplatelet therapy: N/A 3. Pain Management: Lidoderm patch,Voltaren gel QID 4. Mood: Provide emotional support  -antipsychotic agents: N/A 5. Neuropsych: This patient is capable of making decisions on her own behalf. 6. Skin/Wound Care: Routine skin checks 7. Fluids/Electrolytes/Nutrition: Routine in and outs with follow-up chemistries  -encourage appropriate PO 8.  Left mildly displaced fracture of the radial  head and neck.   -Currently with shoulder sling   -NWB with conservative care.    -Plan was to follow Dr. Lorin Mercy as an outpatient.  Will contact orthopedic service in regards to weightbearing status 9.  Hyperlipidemia.  Lipitor held due to elevated LFTs   10.  Elevated LFTs.  Follow-up renal function/hepatic panel.  -recent RUQ [place] suggestive of background liver disease  -LFT's improving 11. HTN: BP's borderline/stable. ARB held d/t AKI 12. AKI: ARB on hold, has received IVF. Cr improving       Cathlyn Parsons, Hershal Coria 08/21/2020

## 2020-08-21 NOTE — Progress Notes (Addendum)
PROGRESS NOTE    [name]  [id number] DOB: 10/30/1917 DOA: 08/17/2020 PCP: Marin Olp, MD   No chief complaint on file.  Brief Narrative:  86 year old female with recent [place], hypertension, hyperlipidemia, macular degeneration of left eye, ICH in 2016 was admitted on 08/11/2020 and discharged on 12/24 after Atziri Zubiate fall.  During that hospital stay CT head showed subarachnoid hemorrhage in the sulcus of left frontal lobe as well as subdural hemorrhage over the left frontoparietal convexity measuring 4 cm in maximal thickness.  Neurosurgery was consulted and recommended conservative management.  She was also found to have left elbow  fracture and orthopedics recommended sling and outpatient follow-up.  Physical therapy recommended skilled nursing facility however patient family took her home.  She was home for less than 24 hours and had persistent sleepiness, lethargy and confusion compared to baseline and she was brought back to ER.  During hospital stay she was given oxycodone and became lethargic and had worsening delirium.  She was discharged home with tramadol.  In [facility] ED at [facility] CT scan showed 2 mm increased SAH and 3 mm midline shift compared to prior imaging, neurosurgery was consulted and recommended no surgical intervention but close monitoring was recommended.  Assessment & Plan:   Active Problems:   Disorientation  1. Acute metabolic encephalopathy-encephalopathy has almost resolved, likely from opioids and tramadol at home.  Pt appears close to baseline, she's anxious, but Ranisha Allaire&Ox1 today, frustrated with herself.  PT recommended CIR.  Awaiting bed at CIR. 1. Insurance denied CIR, will need to look into SNF  2. Traumatic SAH, SDH - due to fall that prompted her first hospitalization.  Repeat CT head showed midline shift of 2 mm at most.  Dr. Cruzita Lederer discussed the case with Dr. Marcello Moores on 12/26 who recommended no acute intervention and recommended repeat CT head in  2 weeks.  3. Acute kidney injury- ARB on hold.  Creatinine improved, today, follow.  Continue to hold ARB.  4. Left elbow fracture-continue sling, orthopedic surgery to follow-up as outpatient  5. Hypertension-blood pressure is stable, slightly elevated today.  ARB on hold due to worsening renal function.  6. Hyperlipidemia-continue atorvastatin  DVT prophylaxis: SCD Code Status: full  Family Communication: none at bedside- daughter over phone Disposition:   Status is: Inpatient  Remains inpatient appropriate because:Inpatient level of care appropriate due to severity of illness   Dispo: The patient is from: Home              Anticipated d/c is to: SNF              Anticipated d/c date is: > 3 days              Patient currently is not medically stable to d/c.   Consultants:   neurosurgery  Procedures:  Echo IMPRESSIONS    1. Left ventricular ejection fraction, by estimation, is 55 to 60%. The  left ventricle has normal function. The left ventricle has no regional  wall motion abnormalities. Left ventricular diastolic function could not  be evaluated.  2. Right ventricular systolic function is normal. The right ventricular  size is normal. There is normal pulmonary artery systolic pressure. The  estimated right ventricular systolic pressure is [id number] mmHg.  3. Left atrial size was severely dilated.  4. Right atrial size was moderately dilated.  5. The mitral valve is abnormal. Mild mitral valve regurgitation.  6. The aortic valve is tricuspid. Aortic valve regurgitation is not  visualized. Mild aortic valve sclerosis is present, with no evidence of  aortic valve stenosis.  7. The inferior vena cava is normal in size with greater than 50%  respiratory variability, suggesting right atrial pressure of 3 mmHg.  8. Cannot exclude small PFO.   Antimicrobials:  Anti-infectives (From admission, onward)   None      Subjective: [name]&Ox1 Anxious, frustrated with  memory difficulties   Objective: Vitals:   08/20/20 1451 08/20/20 2112 08/21/20 0639 08/21/20 1418  BP: 138/62 (!) 141/63 (!) 141/64 (!) 154/75  Pulse: 64 64 77 86  Resp: 18 16 15 18   Temp: 99.7 F (37.6 C) 98.1 F (36.7 C) 98.7 F (37.1 C) 99.7 F (37.6 C)  TempSrc: Oral Oral Oral Oral  SpO2: 98% 97% 95% 95%    Intake/Output Summary (Last 24 hours) at 08/21/2020 1507 Last data filed at 08/21/2020 0645 Gross per 24 hour  Intake 252 ml  Output 1200 ml  Net -948 ml   There were no vitals filed for this visit.  Examination:  General exam: Appears calm and comfortable  L periorbital bruising Respiratory system: Clear to auscultation. Respiratory effort normal. Cardiovascular system: S1 & S2 heard, RRR.  Gastrointestinal system: Abdomen is nondistended, soft and nontender. Central nervous system: Alert and oriented. No focal neurological deficits. Extremities: no LEE Skin: No rashes, lesions or ulcers Psychiatry: Judgement and insight appear normal. Mood & affect appropriate.     Data Reviewed: I have personally reviewed following labs and imaging studies  CBC: Recent Labs  Lab 08/15/20 0751 08/16/20 0810 08/18/20 0338 08/19/20 0213  WBC 11.3* 12.5* 9.4 9.1  NEUTROABS 7.8* 9.6*  --   --   HGB 11.7* 11.6* 11.4* 10.8*  HCT 36.0 36.1 36.2 32.4*  MCV 89.8 90.3 90.7 85.7  PLT 164 159 184 189    Basic Metabolic Panel: Recent Labs  Lab 08/16/20 0810 08/18/20 0338 08/19/20 0213 08/21/20 0132  NA 138 138 136 139  K 4.4 3.9 3.7 3.7  CL 102 104 102 106  CO2 26 21* 23 23  GLUCOSE 126* 116* 122* 118*  BUN 33* 19 22 17   CREATININE 1.18* 0.87 1.16* 0.82  CALCIUM 8.4* 8.3* 8.1* 8.2*    GFR: Estimated Creatinine Clearance: 29.4 mL/min (by C-G formula based on SCr of 0.82 mg/dL).  Liver Function Tests: No results for input(s): AST, ALT, ALKPHOS, BILITOT, PROT, ALBUMIN in the last 168 hours.  CBG: No results for input(s): GLUCAP in the last 168  hours.   Recent Results (from the past 240 hour(s))  Resp Panel by RT-PCR (Flu Trenton Passow&B, Covid) Nasopharyngeal Swab     Status: None   Collection Time: 08/12/20  6:28 AM   Specimen: Nasopharyngeal Swab; Nasopharyngeal(NP) swabs in vial transport medium  Result Value Ref Range Status   SARS Coronavirus 2 by RT PCR NEGATIVE NEGATIVE Final    Comment: (NOTE) SARS-CoV-2 target nucleic acids are NOT DETECTED.  The SARS-CoV-2 RNA is generally detectable in upper respiratory specimens during the acute phase of infection. The lowest concentration of SARS-CoV-2 viral copies this assay can detect is 138 copies/mL. Tychelle Purkey negative result does not preclude SARS-Cov-2 infection and should not be used as the sole basis for treatment or other patient management decisions. Mileidy Atkin negative result may occur with  improper specimen collection/handling, submission of specimen other than nasopharyngeal swab, presence of viral mutation(s) within the areas targeted by this assay, and inadequate number of viral copies(<138 copies/mL). Nixon Sparr negative result must be combined with clinical observations,  patient history, and epidemiological information. The expected result is Negative.  Fact Sheet for Patients:  EntrepreneurPulse.com.au  Fact Sheet for Healthcare Providers:  IncredibleEmployment.be  This test is no t yet approved or cleared by the [place] FDA and  has been authorized for detection and/or diagnosis of SARS-CoV-2 by FDA under an Emergency Use Authorization (EUA). This EUA will remain  in effect (meaning this test can be used) for the duration of the COVID-19 declaration under Section 564(b)(1) of the Act, 21 U.S.C.section 360bbb-3(b)(1), unless the authorization is terminated  or revoked sooner.       Influenza Nerissa Constantin by PCR NEGATIVE NEGATIVE Final   Influenza B by PCR NEGATIVE NEGATIVE Final    Comment: (NOTE) The Xpert Xpress SARS-CoV-2/FLU/RSV plus assay is intended  as an aid in the diagnosis of influenza from Nasopharyngeal swab specimens and should not be used as Janee Ureste sole basis for treatment. Nasal washings and aspirates are unacceptable for Xpert Xpress SARS-CoV-2/FLU/RSV testing.  Fact Sheet for Patients: EntrepreneurPulse.com.au  Fact Sheet for Healthcare Providers: IncredibleEmployment.be  This test is not yet approved or cleared by the [place] FDA and has been authorized for detection and/or diagnosis of SARS-CoV-2 by FDA under an Emergency Use Authorization (EUA). This EUA will remain in effect (meaning this test can be used) for the duration of the COVID-19 declaration under Section 564(b)(1) of the Act, 21 U.S.C. section 360bbb-3(b)(1), unless the authorization is terminated or revoked.  Performed at [facility], [place] [address], [place], [place] [address]          Radiology Studies: No results found.      Scheduled Meds: . amLODipine  5 mg Oral Daily  . atorvastatin  40 mg Oral QHS  . lidocaine  1 patch Transdermal Q24H  . multivitamin with minerals  1 tablet Oral Daily   Continuous Infusions:   LOS: 2 days    Time spent: over 30 min     Fayrene Helper, MD [facility]   To contact the attending provider between 7A-7P or the covering provider during after hours 7P-7A, please log into the web site [email or web address] and access using universal [place] password for that web site. If you do not have the password, please call the hospital operator.  08/21/2020, 3:07 PM

## 2020-08-21 NOTE — Progress Notes (Signed)
Inpatient Rehabilitation-Admissions Coordinator   Received notification from pt's insurance that they are requesting a peer to peer prior to making a determination for CIR. Notified Dr. Lowell Guitar of request and he has agreed to complete before the deadline of 2:30PM today. Will follow up as soon as there is a final determination.   Cheri Rous, OTR/L  Rehab Admissions Coordinator  [phone] 08/21/2020 12:01 PM

## 2020-08-22 ENCOUNTER — Other Ambulatory Visit: Payer: Self-pay | Admitting: Family Medicine

## 2020-08-22 DIAGNOSIS — R4182 Altered mental status, unspecified: Secondary | ICD-10-CM | POA: Diagnosis not present

## 2020-08-22 LAB — CBC WITH DIFFERENTIAL/PLATELET
Abs Immature Granulocytes: 0.07 10*3/uL (ref 0.00–0.07)
Basophils Absolute: 0 10*3/uL (ref 0.0–0.1)
Basophils Relative: 0 %
Eosinophils Absolute: 0.1 10*3/uL (ref 0.0–0.5)
Eosinophils Relative: 1 %
HCT: 29.9 % — ABNORMAL LOW (ref 36.0–46.0)
Hemoglobin: 10.5 g/dL — ABNORMAL LOW (ref 12.0–15.0)
Immature Granulocytes: 1 %
Lymphocytes Relative: 11 %
Lymphs Abs: 1.2 10*3/uL (ref 0.7–4.0)
MCH: 29.7 pg (ref 26.0–34.0)
MCHC: 35.1 g/dL (ref 30.0–36.0)
MCV: 84.5 fL (ref 80.0–100.0)
Monocytes Absolute: 1.6 10*3/uL — ABNORMAL HIGH (ref 0.1–1.0)
Monocytes Relative: 14 %
Neutro Abs: 8.3 10*3/uL — ABNORMAL HIGH (ref 1.7–7.7)
Neutrophils Relative %: 73 %
Platelets: 253 10*3/uL (ref 150–400)
RBC: 3.54 MIL/uL — ABNORMAL LOW (ref 3.87–5.11)
RDW: 14 % (ref 11.5–15.5)
WBC: 11.2 10*3/uL — ABNORMAL HIGH (ref 4.0–10.5)
nRBC: 0 % (ref 0.0–0.2)

## 2020-08-22 LAB — HEPATITIS PANEL, ACUTE
HCV Ab: NONREACTIVE
Hep A IgM: NONREACTIVE
Hep B C IgM: NONREACTIVE
Hepatitis B Surface Ag: NONREACTIVE

## 2020-08-22 LAB — COMPREHENSIVE METABOLIC PANEL
ALT: 252 U/L — ABNORMAL HIGH (ref 0–44)
AST: 165 U/L — ABNORMAL HIGH (ref 15–41)
Albumin: 2.3 g/dL — ABNORMAL LOW (ref 3.5–5.0)
Alkaline Phosphatase: 132 U/L — ABNORMAL HIGH (ref 38–126)
Anion gap: 10 (ref 5–15)
BUN: 23 mg/dL (ref 8–23)
CO2: 24 mmol/L (ref 22–32)
Calcium: 8.2 mg/dL — ABNORMAL LOW (ref 8.9–10.3)
Chloride: 104 mmol/L (ref 98–111)
Creatinine, Ser: 1 mg/dL (ref 0.44–1.00)
GFR, Estimated: 50 mL/min — ABNORMAL LOW (ref >60)
Glucose, Bld: 119 mg/dL — ABNORMAL HIGH (ref 70–99)
Potassium: 3.8 mmol/L (ref 3.5–5.1)
Sodium: 138 mmol/L (ref 135–145)
Total Bilirubin: 1.2 mg/dL (ref 0.3–1.2)
Total Protein: 5.8 g/dL — ABNORMAL LOW (ref 6.5–8.1)

## 2020-08-22 LAB — MAGNESIUM: Magnesium: 2.1 mg/dL (ref 1.7–2.4)

## 2020-08-22 LAB — PHOSPHORUS: Phosphorus: 3.3 mg/dL (ref 2.5–4.6)

## 2020-08-22 NOTE — Care Management Important Message (Signed)
Important Message  Patient Details  Name: [name] MRN: [id number] Date of Birth: 1918-04-04   Medicare Important Message Given:  Yes     [name] 08/22/2020, 4:18 PM

## 2020-08-22 NOTE — Progress Notes (Signed)
Inpatient Rehabilitation-Admissions Coordinator   Received notification from pt's insurance that her request for CIR has been denied, despite peer to peer conference. Family has requested expedited appeal for CIR.   AC will begin process for expedited appeal.   Cheri Rous, OTR/L  Rehab Admissions Coordinator  [phone] 08/22/2020 9:51 AM

## 2020-08-22 NOTE — Progress Notes (Signed)
PROGRESS NOTE    [name]  WJX:[id number] DOB: [birth year] DOA: 08/17/2020 PCP: Carolyn Majestic, MD   No chief complaint on file.  Brief Narrative:  86 year old female with recent ICH, hypertension, hyperlipidemia, macular degeneration of left eye, ICH in 2016 was admitted on 08/11/2020 and discharged on 12/24 after [name] fall.  During that hospital stay CT head showed subarachnoid hemorrhage in the sulcus of left frontal lobe as well as subdural hemorrhage over the left frontoparietal convexity measuring 4 cm in maximal thickness.  Neurosurgery was consulted and recommended conservative management.  She was also found to have left elbow  fracture and orthopedics recommended sling and outpatient follow-up.  Physical therapy recommended skilled nursing facility however patient family took her home.  She was home for less than 24 hours and had persistent sleepiness, lethargy and confusion compared to baseline and she was brought back to ER.  During hospital stay she was given oxycodone and became lethargic and had worsening delirium.  She was discharged home with tramadol.  In the ED at [facility] CT scan showed 2 mm increased SAH and 3 mm midline shift compared to prior imaging, neurosurgery was consulted and recommended no surgical intervention but close monitoring was recommended.  Assessment & Plan:   Active Problems:   Altered mental status   Disorientation  1. Acute metabolic encephalopathy-encephalopathy has almost resolved, likely from opioids and tramadol at home.  Pt appears close to baseline, she's anxious, but [name]&Ox1 today, frustrated with herself.  PT recommended CIR.  Awaiting bed at CIR. 1. Insurance denied CIR, will need to look into SNF 2. Daughter would like to appeal denial, will continue to follow  2. Traumatic SAH, SDH - due to fall that prompted her first hospitalization.  Repeat CT head showed midline shift of 2 mm at most.  Dr. Elvera [name] discussed the case with  Dr. Maisie [name] on 12/26 who recommended no acute intervention and recommended repeat CT head in 2 weeks.  3. Leukocytosis: mild, afebrile, continue to monitor  4. Elevated LFT's: follow acute hepatitis panel, hold statin and tylenol for now  5. Acute kidney injury- ARB on hold.  Creatinine improved, today, follow.  Continue to hold ARB.  6. Left elbow fracture-continue sling, orthopedic surgery to follow-up as outpatient  7. Hypertension-blood pressure is stable, slightly elevated today.  ARB on hold due to worsening renal function.  8. Hyperlipidemia- hold atorvastatin for now with elevated LFT's  DVT prophylaxis: SCD Code Status: full  Family Communication: none at bedside- daughter over phone Disposition:   Status is: Inpatient  Remains inpatient appropriate because:Inpatient level of care appropriate due to severity of illness   Dispo: The patient is from: Home              Anticipated d/c is to: SNF              Anticipated d/c date is: > 3 days              Patient currently is not medically stable to d/c.   Consultants:   neurosurgery  Procedures:  Echo IMPRESSIONS    1. Left ventricular ejection fraction, by estimation, is 55 to 60%. The  left ventricle has normal function. The left ventricle has no regional  wall motion abnormalities. Left ventricular diastolic function could not  be evaluated.  2. Right ventricular systolic function is normal. The right ventricular  size is normal. There is normal pulmonary artery systolic pressure. The  estimated right ventricular systolic  pressure is 29.4 mmHg.  3. Left atrial size was severely dilated.  4. Right atrial size was moderately dilated.  5. The mitral valve is abnormal. Mild mitral valve regurgitation.  6. The aortic valve is tricuspid. Aortic valve regurgitation is not  visualized. Mild aortic valve sclerosis is present, with no evidence of  aortic valve stenosis.  7. The inferior vena cava is normal in  size with greater than 50%  respiratory variability, suggesting right atrial pressure of 3 mmHg.  8. Cannot exclude small PFO.   Antimicrobials:  Anti-infectives (From admission, onward)   None      Subjective: [name]&Ox3 today Anxious wants to get up   Objective: Vitals:   08/21/20 1418 08/21/20 2206 08/22/20 0629 08/22/20 1428  BP: (!) 154/75 135/66 129/63 136/90  Pulse: 86 65 (!) 57 73  Resp: 18 16 14 16   Temp: 99.7 F (37.6 C) 98 F (36.7 C) 98.2 F (36.8 C) 98.9 F (37.2 C)  TempSrc: Oral Oral Oral Oral  SpO2: 95% 97% 97% 98%    Intake/Output Summary (Last 24 hours) at 08/22/2020 1728 Last data filed at 08/22/2020 1609 Gross per 24 hour  Intake 300 ml  Output 900 ml  Net -600 ml   There were no vitals filed for this visit.  Examination:  General: No acute distress. L periorbital bruising Cardiovascular: Heart sounds show [name] regular rate, and rhythm.  Lungs: Clear to auscultation bilaterally  Abdomen: Soft, nontender, nondistended  Neurological: Alert and oriented 3. Moves all extremities 4. Cranial nerves II through XII grossly intact. Skin: Warm and dry. No rashes or lesions. Extremities: No clubbing or cyanosis. No edema.   Data Reviewed: I have personally reviewed following labs and imaging studies  CBC: Recent Labs  Lab 08/16/20 0810 08/18/20 0338 08/19/20 0213 08/22/20 0119  WBC 12.5* 9.4 9.1 11.2*  NEUTROABS 9.6*  --   --  8.3*  HGB 11.6* 11.4* 10.8* 10.5*  HCT 36.1 36.2 32.4* 29.9*  MCV 90.3 90.7 85.7 84.5  PLT 159 184 189 [id number]    Basic Metabolic Panel: Recent Labs  Lab 08/16/20 0810 08/18/20 0338 08/19/20 0213 08/21/20 0132 08/22/20 0119  NA 138 138 136 139 138  K 4.4 3.9 3.7 3.7 3.8  CL 102 104 102 106 104  CO2 26 21* 23 23 24   GLUCOSE 126* 116* 122* 118* 119*  BUN 33* 19 22 17 23   CREATININE 1.18* 0.87 1.16* 0.82 1.00  CALCIUM 8.4* 8.3* 8.1* 8.2* 8.2*  MG  --   --   --   --  2.1  PHOS  --   --   --   --  3.3     GFR: Estimated Creatinine Clearance: 24.1 mL/min (by C-G formula based on SCr of 1 mg/dL).  Liver Function Tests: Recent Labs  Lab 08/22/20 0119  AST 165*  ALT 252*  ALKPHOS 132*  BILITOT 1.2  PROT 5.8*  ALBUMIN 2.3*    CBG: No results for input(s): GLUCAP in the last 168 hours.   No results found for this or any previous visit (from the past 240 hour(s)).       Radiology Studies: No results found.      Scheduled Meds:  amLODipine  5 mg Oral Daily   lidocaine  1 patch Transdermal Q24H   multivitamin with minerals  1 tablet Oral Daily   Continuous Infusions:   LOS: 3 days    Time spent: over 30 min     Fayrene Helper, MD  [facility]   To contact the attending provider between 7A-7P or the covering provider during after hours 7P-7A, please log into the web site [email or web address] and access using universal [place] password for that web site. If you do not have the password, please call the hospital operator.  08/22/2020, 5:28 PM

## 2020-08-23 ENCOUNTER — Inpatient Hospital Stay (HOSPITAL_COMMUNITY): Payer: Medicare Other

## 2020-08-23 DIAGNOSIS — R7989 Other specified abnormal findings of blood chemistry: Secondary | ICD-10-CM

## 2020-08-23 LAB — CBC WITH DIFFERENTIAL/PLATELET
Abs Immature Granulocytes: 0.09 10*3/uL — ABNORMAL HIGH (ref 0.00–0.07)
Basophils Absolute: 0 10*3/uL (ref 0.0–0.1)
Basophils Relative: 0 %
Eosinophils Absolute: 0 10*3/uL (ref 0.0–0.5)
Eosinophils Relative: 0 %
HCT: 31.3 % — ABNORMAL LOW (ref 36.0–46.0)
Hemoglobin: 10.7 g/dL — ABNORMAL LOW (ref 12.0–15.0)
Immature Granulocytes: 1 %
Lymphocytes Relative: 10 %
Lymphs Abs: 1 10*3/uL (ref 0.7–4.0)
MCH: 29.7 pg (ref 26.0–34.0)
MCHC: 34.2 g/dL (ref 30.0–36.0)
MCV: 86.9 fL (ref 80.0–100.0)
Monocytes Absolute: 1.2 10*3/uL — ABNORMAL HIGH (ref 0.1–1.0)
Monocytes Relative: 12 %
Neutro Abs: 7.8 10*3/uL — ABNORMAL HIGH (ref 1.7–7.7)
Neutrophils Relative %: 77 %
Platelets: 275 10*3/uL (ref 150–400)
RBC: 3.6 MIL/uL — ABNORMAL LOW (ref 3.87–5.11)
RDW: 14.1 % (ref 11.5–15.5)
WBC: 10.2 10*3/uL (ref 4.0–10.5)
nRBC: 0 % (ref 0.0–0.2)

## 2020-08-23 LAB — COMPREHENSIVE METABOLIC PANEL
ALT: 501 U/L — ABNORMAL HIGH (ref 0–44)
AST: 477 U/L — ABNORMAL HIGH (ref 15–41)
Albumin: 2.3 g/dL — ABNORMAL LOW (ref 3.5–5.0)
Alkaline Phosphatase: 151 U/L — ABNORMAL HIGH (ref 38–126)
Anion gap: 12 (ref 5–15)
BUN: 25 mg/dL — ABNORMAL HIGH (ref 8–23)
CO2: 23 mmol/L (ref 22–32)
Calcium: 7.9 mg/dL — ABNORMAL LOW (ref 8.9–10.3)
Chloride: 102 mmol/L (ref 98–111)
Creatinine, Ser: 1.02 mg/dL — ABNORMAL HIGH (ref 0.44–1.00)
GFR, Estimated: 49 mL/min — ABNORMAL LOW (ref >60)
Glucose, Bld: 139 mg/dL — ABNORMAL HIGH (ref 70–99)
Potassium: 3.7 mmol/L (ref 3.5–5.1)
Sodium: 137 mmol/L (ref 135–145)
Total Bilirubin: 0.8 mg/dL (ref 0.3–1.2)
Total Protein: 5.7 g/dL — ABNORMAL LOW (ref 6.5–8.1)

## 2020-08-23 LAB — MAGNESIUM: Magnesium: 1.9 mg/dL (ref 1.7–2.4)

## 2020-08-23 LAB — PHOSPHORUS: Phosphorus: 3 mg/dL (ref 2.5–4.6)

## 2020-08-23 NOTE — Progress Notes (Signed)
Inpatient Rehabilitation-Admissions Coordinator   Received insurance approval through expedited appeal process. I do not have a bed open for this patient today or this weekend but will follow up next week for possible admit, pending bed availability.   Notified pt's daughter of approval.   Cheri Rous, OTR/L  Rehab Admissions Coordinator  [phone] 08/23/2020 4:13 PM

## 2020-08-23 NOTE — Progress Notes (Signed)
PROGRESS NOTE    [name]  [id number] DOB: October 04, 1917 DOA: 08/17/2020 PCP: Marin Olp, MD   No chief complaint on file.  Brief Narrative:  86 year old female with recent [place], hypertension, hyperlipidemia, macular degeneration of left eye, ICH in 2016 was admitted on 08/11/2020 and discharged on 12/24 after Batu Cassin fall.  During that hospital stay CT head showed subarachnoid hemorrhage in the sulcus of left frontal lobe as well as subdural hemorrhage over the left frontoparietal convexity measuring 4 cm in maximal thickness.  Neurosurgery was consulted and recommended conservative management.  She was also found to have left elbow  fracture and orthopedics recommended sling and outpatient follow-up.  Physical therapy recommended skilled nursing facility however patient family took her home.  She was home for less than 24 hours and had persistent sleepiness, lethargy and confusion compared to baseline and she was brought back to ER.  During hospital stay she was given oxycodone and became lethargic and had worsening delirium.  She was discharged home with tramadol.  In the ED at [facility] CT scan showed 2 mm increased SAH and 3 mm midline shift compared to prior imaging, neurosurgery was consulted and recommended no surgical intervention but close monitoring was recommended.  Assessment & Plan:   Active Problems:   Altered mental status   Disorientation  1. Acute metabolic encephalopathy-encephalopathy has almost resolved, likely from opioids and tramadol at home.  Pt appears close to baseline, she's anxious, but [place]&Ox3 today, anxious today.  PT recommended CIR.  Awaiting bed at CIR. 1. Insurance denied CIR, will need to look into SNF 2. Daughter would like to appeal denial, will continue to follow  2. Traumatic SAH, SDH - due to fall that prompted her first hospitalization.  Repeat CT head showed midline shift of 2 mm at most.  Dr. Cruzita Lederer discussed the case with Dr. Marcello Moores  on 12/26 who recommended no acute intervention and recommended repeat CT head in 2 weeks.  3. Leukocytosis: mild, afebrile, continue to monitor  4. Elevated LFT's: worsening, acute hepatitis panel negative.  Lipitor and tylenol have been discontinued.  Discussed with pharmacy, will also d/c amlodipine though feel this is less likely.  1. Follow RUQ [place], continue to monitor  5. Acute kidney injury- ARB on hold.  Creatinine improved, today, follow.  Continue to hold ARB.  6. Left elbow fracture-continue sling, orthopedic surgery to follow-up as outpatient  7. Hypertension-blood pressure is stable, slightly elevated today.  ARB on hold due to worsening renal function.  8. Hyperlipidemia- hold atorvastatin for now with elevated LFT's  DVT prophylaxis: SCD Code Status: full  Family Communication: none at bedside- daughter over phone 12/30 Disposition:   Status is: Inpatient  Remains inpatient appropriate because:Inpatient level of care appropriate due to severity of illness   Dispo: The patient is from: Home              Anticipated d/c is to: SNF              Anticipated d/c date is: > 3 days              Patient currently is not medically stable to d/c.   Consultants:   neurosurgery  Procedures:  Echo IMPRESSIONS    1. Left ventricular ejection fraction, by estimation, is 55 to 60%. The  left ventricle has normal function. The left ventricle has no regional  wall motion abnormalities. Left ventricular diastolic function could not  be evaluated.  2. Right ventricular  systolic function is normal. The right ventricular  size is normal. There is normal pulmonary artery systolic pressure. The  estimated right ventricular systolic pressure is 29.4 mmHg.  3. Left atrial size was severely dilated.  4. Right atrial size was moderately dilated.  5. The mitral valve is abnormal. Mild mitral valve regurgitation.  6. The aortic valve is tricuspid. Aortic valve regurgitation is  not  visualized. Mild aortic valve sclerosis is present, with no evidence of  aortic valve stenosis.  7. The inferior vena cava is normal in size with greater than 50%  respiratory variability, suggesting right atrial pressure of 3 mmHg.  8. Cannot exclude small PFO.   Antimicrobials:  Anti-infectives (From admission, onward)   None      Subjective: [name]&Ox3 No new complaints today Up in chair, says she can't lie in bed all the time  Objective: Vitals:   08/22/20 0629 08/22/20 1428 08/22/20 2049 08/23/20 0538  BP: 129/63 136/90 (!) 138/51 (!) 148/63  Pulse: (!) 57 73 88 73  Resp: 14 16 17 17   Temp: 98.2 F (36.8 C) 98.9 F (37.2 C) 98.6 F (37 C) 98.1 F (36.7 C)  TempSrc: Oral Oral Oral Oral  SpO2: 97% 98% 93% 94%    Intake/Output Summary (Last 24 hours) at 08/23/2020 0943 Last data filed at 08/23/2020 0845 Gross per 24 hour  Intake 700 ml  Output 1100 ml  Net -400 ml   There were no vitals filed for this visit.  Examination:  General: No acute distress. L periorbital bruising/swelling Cardiovascular: Heart sounds show Rayhan Groleau regular rate, and rhythm. Lungs: Clear to auscultation bilaterally  Abdomen: Soft, nontender, nondistended  Neurological: Alert and oriented 3. Moves all extremities 4. Cranial nerves II through XII grossly intact. Skin: Warm and dry. No rashes or lesions. Extremities: No clubbing or cyanosis. No edema.    Data Reviewed: I have personally reviewed following labs and imaging studies  CBC: Recent Labs  Lab 08/18/20 0338 08/19/20 0213 08/22/20 0119 08/23/20 0019  WBC 9.4 9.1 11.2* 10.2  NEUTROABS  --   --  8.3* 7.8*  HGB 11.4* 10.8* 10.5* 10.7*  HCT 36.2 32.4* 29.9* 31.3*  MCV 90.7 85.7 84.5 86.9  PLT 184 189 253 275    Basic Metabolic Panel: Recent Labs  Lab 08/18/20 0338 08/19/20 0213 08/21/20 0132 08/22/20 0119 08/23/20 0019  NA 138 136 139 138 137  K 3.9 3.7 3.7 3.8 3.7  CL 104 102 106 104 102  CO2 21* 23 23 24 23    GLUCOSE 116* 122* 118* 119* 139*  BUN 19 22 17 23  25*  CREATININE 0.87 1.16* 0.82 1.00 1.02*  CALCIUM 8.3* 8.1* 8.2* 8.2* 7.9*  MG  --   --   --  2.1 1.9  PHOS  --   --   --  3.3 3.0    GFR: Estimated Creatinine Clearance: 23.7 mL/min ([place]) (by C-G formula based on SCr of 1.02 mg/dL (H)).  Liver Function Tests: Recent Labs  Lab 08/22/20 0119 08/23/20 0019  AST 165* 477*  ALT 252* 501*  ALKPHOS 132* 151*  BILITOT 1.2 0.8  PROT 5.8* 5.7*  ALBUMIN 2.3* 2.3*    CBG: No results for input(s): GLUCAP in the last 168 hours.   No results found for this or any previous visit (from the past 240 hour(s)).       Radiology Studies: No results found.      Scheduled Meds: . lidocaine  1 patch Transdermal Q24H  .  multivitamin with minerals  1 tablet Oral Daily   Continuous Infusions:   LOS: 4 days    Time spent: over 30 min     Fayrene Helper, MD [facility]   To contact the attending provider between 7A-7P or the covering provider during after hours 7P-7A, please log into the web site [email or web address] and access using universal  password for that web site. If you do not have the password, please call the hospital operator.  08/23/2020, 9:43 AM

## 2020-08-23 NOTE — NC FL2 (Signed)
[place] MEDICAID FL2 LEVEL OF CARE SCREENING TOOL     IDENTIFICATION  Patient Name: [name] Birthdate: [birth year] Sex: female Admission Date (Current Location): 08/17/2020  [facility] and [place] Number:  Producer, television/film/video and Address:  [facility] [place], [address] [place], [place] [address]      Provider Number: [id number]  Attending Physician Name and Address:  Zigmund Daniel., *  Relative Name and Phone Number:       Current Level of Care: Hospital Recommended Level of Care: Skilled Nursing Facility Prior Approval Number:    Date Approved/Denied:   PASRR Number: [id number]  Discharge Plan: SNF    Current Diagnoses: Patient Active Problem List   Diagnosis Date Noted  . LFT elevation   . Disorientation 08/17/2020  . SAH (subarachnoid hemorrhage) (HCC) 08/13/2020  . Subdural hematoma (HCC) 08/12/2020  . Fall at home, initial encounter 08/12/2020  . Subarachnoid hemorrhage following injury (HCC) 08/12/2020  . Macular degeneration 04/16/2020  . Exudative age-related macular degeneration of left eye with active choroidal neovascularization (HCC) 04/02/2020  . Intermediate stage nonexudative age-related macular degeneration of both eyes 04/02/2020  . Chronic kidney disease, stage 3a (HCC) 08/01/2018  . Meningioma (HCC) 03/31/2018  . History of CVA in adulthood 03/31/2018  . Mixed hyperlipidemia 03/15/2018  . Urinary incontinence 04/28/2017  . Reactive depression 08/14/2015  . Intention tremor 06/21/2015  . Cerebral amyloid angiopathy (HCC) 05/29/2015  . Atrial fibrillation, chronic (HCC) 03/29/2015  . Altered mental status   . History of intracerebral hemorrhage    . Dizzy spells 11/10/2014  . SPINAL STENOSIS, CERVICAL 06/11/2010  . Hypertensive urgency 06/22/2006  . Arthritis of right knee 06/22/2006  . Osteopenia 06/22/2006    Orientation RESPIRATION BLADDER Height & Weight     Self,Time,Situation,Place  Normal  Incontinent Weight:   Height:     BEHAVIORAL SYMPTOMS/MOOD NEUROLOGICAL BOWEL NUTRITION STATUS      Incontinent Diet (See discharge summary)  AMBULATORY STATUS COMMUNICATION OF NEEDS Skin   Extensive Assist Verbally Normal                       Personal Care Assistance Level of Assistance  Bathing,Feeding,Dressing Bathing Assistance: Maximum assistance Feeding assistance: Limited assistance Dressing Assistance: Maximum assistance     Functional Limitations Info  Sight,Hearing,Speech Sight Info: Adequate Hearing Info: Adequate Speech Info: Adequate    SPECIAL CARE FACTORS FREQUENCY  PT (By licensed PT),OT (By licensed OT)     PT Frequency: 5x a week OT Frequency: 5x a week            Contractures Contractures Info: Not present    Additional Factors Info  Code Status,Allergies Code Status Info: full Allergies Info: Ace Inhibitors   Other           Current Medications (08/23/2020):  This is the current hospital active medication list Current Facility-Administered Medications  Medication Dose Route Frequency Provider Last Rate Last Admin  . hydrALAZINE (APRESOLINE) tablet 25 mg  25 mg Oral Q6H PRN Mikey College T, MD   25 mg at 08/21/20 1450  . ipratropium-albuterol (DUONEB) 0.5-2.5 (3) MG/3ML nebulizer solution 3 mL  3 mL Nebulization Q4H PRN Marikay Alar, FNP   3 mL at 08/19/20 2140  . lidocaine (LIDODERM) 5 % 1 patch  1 patch Transdermal Q24H Emeline General, MD   1 patch at 08/23/20 1321  . multivitamin with minerals tablet 1 tablet  1 tablet Oral Daily  Lequita Halt, MD   1 tablet at 08/23/20 [id number]     Discharge Medications: Please see discharge summary for a list of discharge medications.  Relevant Imaging Results:  Relevant Lab Results:   Additional Information SSN [id number]  Emeterio Reeve, [place]

## 2020-08-23 NOTE — TOC Initial Note (Signed)
Transition of Care [facility]) - Initial/Assessment Note    Patient Details  Name: [name] MRN: [id number] Date of Birth: 1917/10/02  Transition of Care [facility]) CM/SW Contact:    Emeterio Reeve, [place] Phone Number: 08/23/2020, 1:40 PM  Clinical Narrative:                  CSW met with pt and daughter [name] in room. [name] stated that PTA pt and her husband live in her home. [name] stated pt was independent and was able to complete ADL's with minimal assistance. Pt is covid vaccinated.  PT/OT reccs CIR. Pts Iinsurance has denied for CIR, but they have submitted an appeal and is waiting answer. [name] gave CSW permission to send pt out to SNFs as a backup option only. [name] has no preference at this time. [name] reports pt has not been to a nursing facility but has been to CIR in the past.  Expected Discharge Plan: IP Rehab Facility Barriers to Discharge: Continued Medical Work up,Insurance Authorization   Patient Goals and CMS Choice Patient states their goals for this hospitalization and ongoing recovery are:: to return home   Choice offered to / list presented to : Adult Children  Expected Discharge Plan and Services Expected Discharge Plan: [name]       Living arrangements for the past 2 months: Single Family Home                                      Prior Living Arrangements/Services Living arrangements for the past 2 months: Single Family Home Lives with:: [name] Patient language and need for interpreter reviewed:: Yes        Need for Family Participation in Patient Care: Yes (Comment) Care giver support system in place?: Yes (comment)   Criminal Activity/Legal Involvement Pertinent to Current Situation/Hospitalization: No - Comment as needed  Activities of Daily Living      Permission Sought/Granted   Permission granted to share information with : Yes, Verbal Permission Granted     Permission granted to share info w AGENCY:  SNF  Permission granted to share info w Relationship: Daughter     Emotional Assessment Appearance:: Appears stated age Attitude/Demeanor/Rapport: Engaged Affect (typically observed): Appropriate Orientation: : Oriented to Self,Oriented to Place,Oriented to  Time,Oriented to Situation Alcohol / Substance Use: Not Applicable Psych Involvement: No (comment)  Admission diagnosis:  Confusion [R41.0] Disorientation [R41.0] Patient Active Problem List   Diagnosis Date Noted  . LFT elevation   . Disorientation 08/17/2020  . SAH (subarachnoid hemorrhage) ([place]) 08/13/2020  . Subdural hematoma ([place]) 08/12/2020  . Fall at home, initial encounter 08/12/2020  . Subarachnoid hemorrhage following injury ([place]) 08/12/2020  . Macular degeneration 04/16/2020  . Exudative age-related macular degeneration of left eye with active choroidal neovascularization ([place]) 04/02/2020  . Intermediate stage nonexudative age-related macular degeneration of both eyes 04/02/2020  . Chronic kidney disease, stage 3a ([place]) 08/01/2018  . Meningioma ([place]) 03/31/2018  . History of CVA in adulthood 03/31/2018  . Mixed hyperlipidemia 03/15/2018  . Urinary incontinence 04/28/2017  . Reactive depression 08/14/2015  . Intention tremor 06/21/2015  . Cerebral amyloid angiopathy ([place]) 05/29/2015  . Atrial fibrillation, chronic ([place]) 03/29/2015  . Altered mental status   . History of intracerebral hemorrhage    . Dizzy spells 11/10/2014  . SPINAL STENOSIS, CERVICAL 06/11/2010  . Hypertensive urgency 06/22/2006  . Arthritis of right  knee 06/22/2006  . Osteopenia 06/22/2006   PCP:  Marin Olp, MD Pharmacy:   [facility], [address], [address] [place], [place] [address] Phone: [phone] Fax: [place] [address] [place] [address] [place] [address] Phone: [phone] Fax:  [phone]     Social Determinants of Health ([place]) Interventions    Readmission Risk Interventions No flowsheet data found.  Emeterio Reeve, Latanya Presser, [place] Social Worker [phone]

## 2020-08-24 DIAGNOSIS — R4182 Altered mental status, unspecified: Secondary | ICD-10-CM | POA: Diagnosis not present

## 2020-08-24 LAB — COMPREHENSIVE METABOLIC PANEL
ALT: 400 U/L — ABNORMAL HIGH (ref 0–44)
AST: 255 U/L — ABNORMAL HIGH (ref 15–41)
Albumin: 2.3 g/dL — ABNORMAL LOW (ref 3.5–5.0)
Alkaline Phosphatase: 146 U/L — ABNORMAL HIGH (ref 38–126)
Anion gap: 12 (ref 5–15)
BUN: 17 mg/dL (ref 8–23)
CO2: 24 mmol/L (ref 22–32)
Calcium: 8.3 mg/dL — ABNORMAL LOW (ref 8.9–10.3)
Chloride: 101 mmol/L (ref 98–111)
Creatinine, Ser: 0.78 mg/dL (ref 0.44–1.00)
GFR, Estimated: >60 mL/min (ref >60)
Glucose, Bld: 133 mg/dL — ABNORMAL HIGH (ref 70–99)
Potassium: 3.6 mmol/L (ref 3.5–5.1)
Sodium: 137 mmol/L (ref 135–145)
Total Bilirubin: 1.2 mg/dL (ref 0.3–1.2)
Total Protein: 5.9 g/dL — ABNORMAL LOW (ref 6.5–8.1)

## 2020-08-24 LAB — CBC WITH DIFFERENTIAL/PLATELET
Abs Immature Granulocytes: 0.1 10*3/uL — ABNORMAL HIGH (ref 0.00–0.07)
Basophils Absolute: 0 10*3/uL (ref 0.0–0.1)
Basophils Relative: 0 %
Eosinophils Absolute: 0 10*3/uL (ref 0.0–0.5)
Eosinophils Relative: 0 %
HCT: 32.2 % — ABNORMAL LOW (ref 36.0–46.0)
Hemoglobin: 10.5 g/dL — ABNORMAL LOW (ref 12.0–15.0)
Immature Granulocytes: 1 %
Lymphocytes Relative: 8 %
Lymphs Abs: 1.1 10*3/uL (ref 0.7–4.0)
MCH: 28 pg (ref 26.0–34.0)
MCHC: 32.6 g/dL (ref 30.0–36.0)
MCV: 85.9 fL (ref 80.0–100.0)
Monocytes Absolute: 1.4 10*3/uL — ABNORMAL HIGH (ref 0.1–1.0)
Monocytes Relative: 11 %
Neutro Abs: 10 10*3/uL — ABNORMAL HIGH (ref 1.7–7.7)
Neutrophils Relative %: 80 %
Platelets: 281 10*3/uL (ref 150–400)
RBC: 3.75 MIL/uL — ABNORMAL LOW (ref 3.87–5.11)
RDW: 13.9 % (ref 11.5–15.5)
WBC: 12.6 10*3/uL — ABNORMAL HIGH (ref 4.0–10.5)
nRBC: 0 % (ref 0.0–0.2)

## 2020-08-24 LAB — PHOSPHORUS: Phosphorus: 3.2 mg/dL (ref 2.5–4.6)

## 2020-08-24 LAB — MAGNESIUM: Magnesium: 2 mg/dL (ref 1.7–2.4)

## 2020-08-24 MED ORDER — ACETAMINOPHEN 325 MG PO TABS
325.0000 mg | ORAL_TABLET | Freq: Four times a day (QID) | ORAL | Status: DC | PRN
  Administered 2020-08-24 – 2020-08-25 (×3): 325 mg via ORAL
  Filled 2020-08-24 (×3): qty 1

## 2020-08-24 MED ORDER — DICLOFENAC SODIUM 1 % EX GEL
2.0000 g | Freq: Four times a day (QID) | CUTANEOUS | Status: DC
  Administered 2020-08-24 – 2020-08-26 (×9): 2 g via TOPICAL
  Filled 2020-08-24: qty 100

## 2020-08-24 NOTE — Progress Notes (Signed)
Physical Therapy Treatment Patient Details Name: [name] MRN: [id number] DOB: May 25, 1918 Today's Date: 08/24/2020    History of Present Illness Patient  87 year old female with history of right temporal intracranial hemorrhage with hypertensive crisis in 2016, hypertension, macular degeneration, meningioma, paroxysmal atrial fibrillation, stable amyloid angiopathy, hyperlipidemia, CKD stage III Discharged from [facility] on 12/23 after a short stay for  subarachnoid hemorrhage in the sulcus of the left frontal lobe as well as subdural hemorrhage over the left frontoparietal convexity measuring 4 cm in maximum thickness along with radial head fracture. Pt returned to [facility] on 12/25. CT scan showed 2 mm increasing SAH a new 3 mm midline shift compared to prior imaging.    PT Comments    The pt was OOB in the recliner upon arrival of PT, but with reports of significant pain at this time despite arrival of RN to deliver voltaren gel and lidocaine patch during session. The pt was able to maintain seated balance with use of RUE on armrest of recliner only, and required modA that progressed to maxA with pt fatigue to stand from chair. The pt was unable to tolerate standing due to pain, and was unable to achieve wt shift or clear feet for initiation of steps at this time. Focused on LE AROM and PROM to maintain motion, reduce stiffness, and maintain strength. The pt remains disoriented (asking "where am I?" multiple times through session) and with noted STM deficits, but was pleasant and able to follow all commands through session. Pt eager to continue working with therapies, but hoping for improved pain control in future sessions.    Follow Up Recommendations  CIR;Supervision for mobility/OOB     Equipment Recommendations  Wheelchair (measurements PT);Wheelchair cushion (measurements PT)    Recommendations for Other Services       Precautions / Restrictions Precautions Precautions: Fall Precaution  Comments: Sling to immobilize LUE, no formal NWB status at this time. Hx of left wrist fracture - splint from home applied to left wrist Required Braces or Orthoses: Sling Restrictions Weight Bearing Restrictions: Yes LUE Weight Bearing: Non weight bearing Other Position/Activity Restrictions: L UE: no order however keeping NWB due to sling and shoulder pain    Mobility  Bed Mobility Overal bed mobility: Needs Assistance             General bed mobility comments: pt OOB in recliner at start and end of session  Transfers Overall transfer level: Needs assistance Equipment used: 2 person hand held assist Transfers: Sit to/from Stand Sit to Stand: Mod assist         General transfer comment: sit-stand x2 with modA from recliner. pt with reports of significant pain, unable to progress to standing march or wt shift. fatigues after ~5-8 seconds and turns into maxA  Ambulation/Gait             General Gait Details: pt unable to generate wt shift for stepping today due to pain       Balance Overall balance assessment: Needs assistance Sitting-balance support: Single extremity supported Sitting balance-Leahy Scale: Poor Sitting balance - Comments: reliant on R UE support   Standing balance support: Single extremity supported Standing balance-Leahy Scale: Poor Standing balance comment: reliant on external support                            Cognition Arousal/Alertness: Awake/alert Behavior During Therapy: Restless;Anxious Overall Cognitive Status: Impaired/Different from baseline Area of Impairment: Orientation;Memory;Following commands;Attention  Orientation Level: Disoriented to;Place;Time;Situation Current Attention Level: Selective Memory: Decreased short-term memory Following Commands: Follows one step commands with increased time   Awareness: Emergent   General Comments: Patient with increasing anxiety which hinders  memory/attention to tasks      Exercises General Exercises - Lower Extremity Long Arc Quad: AROM;Both;10 reps Hip Flexion/Marching: AROM;Both;10 reps Heel Raises: AROM;Both;10 reps;Seated    General Comments General comments (skin integrity, edema, etc.): pt restless and anxious, reporting significant pain all over throughout session which limited mobility progression, RN present and able to give gel and patch for pain      Pertinent Vitals/Pain Pain Assessment: Faces Faces Pain Scale: Hurts little more Pain Location: generalized, all over, when asked, pt will report "my legs, my arms, my hands, everywhere" Pain Descriptors / Indicators: Discomfort;Guarding;Grimacing;Moaning Pain Intervention(s): Limited activity within patient's tolerance;Monitored during session;Repositioned;RN gave pain meds during session           PT Goals (current goals can now be found in the care plan section) Acute Rehab PT Goals Patient Stated Goal: to reduce pain PT Goal Formulation: With patient Time For Goal Achievement: 09/02/20 Potential to Achieve Goals: Fair Progress towards PT goals: Progressing toward goals    Frequency    Min 3X/week      PT Plan Current plan remains appropriate       AM-PAC PT "6 Clicks" Mobility   Outcome Measure  Help needed turning from your back to your side while in a flat bed without using bedrails?: A Lot Help needed moving from lying on your back to sitting on the side of a flat bed without using bedrails?: A Lot Help needed moving to and from a bed to a chair (including a wheelchair)?: A Lot Help needed standing up from a chair using your arms (e.g., wheelchair or bedside chair)?: A Lot Help needed to walk in hospital room?: A Lot Help needed climbing 3-5 steps with a railing? : Total 6 Click Score: 11    End of Session Equipment Utilized During Treatment: Gait belt (LUE sling) Activity Tolerance: Patient limited by pain Patient left: in  chair;with call bell/phone within reach;with chair alarm set Nurse Communication: Mobility status PT Visit Diagnosis: Unsteadiness on feet (R26.81)     Time: [id number] PT Time Calculation (min) (ACUTE ONLY): 36 min  Charges:  $Therapeutic Activity: 23-37 mins                     Karma Ganja, PT, DPT   Acute Rehabilitation Department Pager #: [phone]   Otho Bellows 08/24/2020, 2:04 PM

## 2020-08-24 NOTE — Progress Notes (Addendum)
PROGRESS NOTE    [name]  GEX:[id number] DOB: 1918/03/28 DOA: 08/17/2020 PCP: [name], MD   No chief complaint on file.  Brief Narrative:  87 year old female with recent ICH, hypertension, hyperlipidemia, macular degeneration of left eye, ICH in 2016 was admitted on 08/11/2020 and discharged on 12/24 after [name] fall.  During that hospital stay CT head showed subarachnoid hemorrhage in the sulcus of left frontal lobe as well as subdural hemorrhage over the left frontoparietal convexity measuring 4 cm in maximal thickness.  Neurosurgery was consulted and recommended conservative management.  She was also found to have left elbow  fracture and orthopedics recommended sling and outpatient follow-up.  Physical therapy recommended skilled nursing facility however patient family took her home.  She was home for less than 24 hours and had persistent sleepiness, lethargy and confusion compared to baseline and she was brought back to ER.  During hospital stay she was given oxycodone and became lethargic and had worsening delirium.  She was discharged home with tramadol.  In the ED at [facility] CT scan showed 2 mm increased SAH and 3 mm midline shift compared to prior imaging, neurosurgery was consulted and recommended no surgical intervention but close monitoring was recommended.  Assessment & Plan:   Active Problems:   Altered mental status   Disorientation   LFT elevation  1. Acute metabolic encephalopathy-encephalopathy has almost resolved, likely from opioids and tramadol at home.  Pt appears close to baseline, she's anxious, but Daron Breeding&Ox3 today, continues to be anxious today.  PT recommended CIR.  Awaiting bed at CIR. 1. Insurance denied CIR, will need to look into SNF 2. Daughter would like to appeal denial, will continue to follow  2. Traumatic SAH, SDH - due to fall that prompted her first hospitalization.  Repeat CT head showed midline shift of 2 mm at most.  Dr. [name]  discussed the case with Dr. [name] on 12/26 who recommended no acute intervention and recommended repeat CT head in 2 weeks.  3. Leukocytosis: mild, afebrile, continue to monitor  4. Elevated LFT's: worsening, acute hepatitis panel negative.  Lipitor has been discontinued.  Resume low dose APAP.  will also d/c amlodipine though feel this is less likely.  1. Limited RUQ [place] -> suggests background liver disease 2. Improving, continue to follow  5. Acute kidney injury- ARB on hold.  Creatinine improved, today, follow.  Continue to hold ARB.  6. Left elbow fracture-continue sling, orthopedic surgery to follow-up as outpatient 1.  complaining of upper extremity pain today (bilateral) no new trauma or exam findings, continue to monitor closely with apap/voltaren   7. Hypertension-blood pressure is stable, slightly elevated today.  ARB on hold due to worsening renal function.  8. Hyperlipidemia- hold atorvastatin for now with elevated LFT's  DVT prophylaxis: SCD Code Status: full  Family Communication: daughter at bedside Disposition:   Status is: Inpatient  Remains inpatient appropriate because:Inpatient level of care appropriate due to severity of illness   Dispo: The patient is from: Home              Anticipated d/c is to: SNF              Anticipated d/c date is: > 3 days              Patient currently is not medically stable to d/c.   Consultants:   neurosurgery  Procedures:  Echo IMPRESSIONS    1. Left ventricular ejection fraction, by estimation, is 55  to 60%. The  left ventricle has normal function. The left ventricle has no regional  wall motion abnormalities. Left ventricular diastolic function could not  be evaluated.  2. Right ventricular systolic function is normal. The right ventricular  size is normal. There is normal pulmonary artery systolic pressure. The  estimated right ventricular systolic pressure is [id number] mmHg.  3. Left atrial size was severely  dilated.  4. Right atrial size was moderately dilated.  5. The mitral valve is abnormal. Mild mitral valve regurgitation.  6. The aortic valve is tricuspid. Aortic valve regurgitation is not  visualized. Mild aortic valve sclerosis is present, with no evidence of  aortic valve stenosis.  7. The inferior vena cava is normal in size with greater than 50%  respiratory variability, suggesting right atrial pressure of 3 mmHg.  8. Cannot exclude small PFO.   Antimicrobials:  Anti-infectives (From admission, onward)   None      Subjective: Anxious today, c/o arm discomfort  Objective: Vitals:   08/23/20 1340 08/23/20 2001 08/24/20 0454 08/24/20 1350  BP: (!) 141/53 (!) 160/69 (!) 145/69 (!) 144/69  Pulse: 70 68 70 72  Resp: 20 17 16 17   Temp: 98.3 F (36.8 C) 99.7 F (37.6 C) 98.4 F (36.9 C) 97.9 F (36.6 C)  TempSrc: Oral Oral Oral Oral  SpO2: 98% 91% 95% 100%    Intake/Output Summary (Last 24 hours) at 08/24/2020 1713 Last data filed at 08/24/2020 1219 Gross per 24 hour  Intake 420 ml  Output 1150 ml  Net -730 ml   There were no vitals filed for this visit.  Examination:  General: No acute distress. L periorbital brusing Cardiovascular: Heart sounds show Maicol Bowland regular rate, and rhythm Lungs: Clear to auscultation bilaterally  Abdomen: Soft, nontender, nondistended Neurological: Alert and oriented 3. Moves all extremities 4. Cranial nerves II through XII grossly intact. Skin: Warm and dry. No rashes or lesions. Extremities: No clubbing or cyanosis. No edema.   Data Reviewed: I have personally reviewed following labs and imaging studies  CBC: Recent Labs  Lab 08/18/20 0338 08/19/20 0213 08/22/20 0119 08/23/20 0019 08/24/20 0214  WBC 9.4 9.1 11.2* 10.2 12.6*  NEUTROABS  --   --  8.3* 7.8* 10.0*  HGB 11.4* 10.8* 10.5* 10.7* 10.5*  HCT 36.2 32.4* 29.9* 31.3* 32.2*  MCV 90.7 85.7 84.5 86.9 85.9  PLT 184 189 253 275 [id number]    Basic Metabolic Panel: Recent Labs   Lab 08/19/20 0213 08/21/20 0132 08/22/20 0119 08/23/20 0019 08/24/20 0214  NA 136 139 138 137 137  K 3.7 3.7 3.8 3.7 3.6  CL 102 106 104 102 101  CO2 23 23 24 23 24   GLUCOSE 122* 118* 119* 139* 133*  BUN 22 17 23  25* 17  CREATININE 1.16* 0.82 1.00 1.02* 0.78  CALCIUM 8.1* 8.2* 8.2* 7.9* 8.3*  MG  --   --  2.1 1.9 2.0  PHOS  --   --  3.3 3.0 3.2    GFR: Estimated Creatinine Clearance: 30.2 mL/min (by C-G formula based on SCr of 0.78 mg/dL).  Liver Function Tests: Recent Labs  Lab 08/22/20 0119 08/23/20 0019 08/24/20 0214  AST 165* 477* 255*  ALT 252* 501* 400*  ALKPHOS 132* 151* 146*  BILITOT 1.2 0.8 1.2  PROT 5.8* 5.7* 5.9*  ALBUMIN 2.3* 2.3* 2.3*    CBG: No results for input(s): GLUCAP in the last 168 hours.   No results found for this or any previous visit (from the past  240 hour(s)).       Radiology Studies: US Abdomen Limited RUQ (LIVER/GB)  Result Date: 08/23/2020 CLINICAL DATA:  Increased LFTs. EXAM: ULTRASOUND ABDOMEN LIMITED RIGHT UPPER QUADRANT COMPARISON:  None FINDINGS: Gallbladder: Limited visualization of the gallbladder due to difficulty in patient positioning due to altered mental status and adjacent bowel gas. Cholelithiasis, 8 mm calculus without wall thickening or pericholecystic fluid. No tenderness over the gallbladder reported by the sonographer. Common bile duct: Diameter: 1.8 mm Liver: Coarsened hepatic echotexture is suggested with very limited visualization of much of the RIGHT hepatic lobe. No focal lesion on submitted images. Mildly nodular contour. Visualized portal vein is patent with hepatopetal flow though with limited assessment. Other: None. IMPRESSION: Limited evaluation with findings that suggest background liver disease. If there is further concern for hepatic or biliary abnormality further assessment with cross-sectional imaging either with CT or MR could be considered as warranted. Electronically Signed   By: Zetta Bills M.D.    On: 08/23/2020 15:11        Scheduled Meds: . diclofenac Sodium  2 g Topical QID  . lidocaine  1 patch Transdermal Q24H  . multivitamin with minerals  1 tablet Oral Daily   Continuous Infusions:   LOS: 5 days    Time spent: over 30 min     Fayrene Helper, MD [facility]   To contact the attending provider between 7A-7P or the covering provider during after hours 7P-7A, please log into the web site [email or web address] and access using universal [place] password for that web site. If you do not have the password, please call the hospital operator.  08/24/2020, 5:13 PM

## 2020-08-25 DIAGNOSIS — R410 Disorientation, unspecified: Secondary | ICD-10-CM | POA: Diagnosis not present

## 2020-08-25 LAB — CBC WITH DIFFERENTIAL/PLATELET
Abs Immature Granulocytes: 0.11 10*3/uL — ABNORMAL HIGH (ref 0.00–0.07)
Basophils Absolute: 0 10*3/uL (ref 0.0–0.1)
Basophils Relative: 0 %
Eosinophils Absolute: 0.1 10*3/uL (ref 0.0–0.5)
Eosinophils Relative: 1 %
HCT: 32.3 % — ABNORMAL LOW (ref 36.0–46.0)
Hemoglobin: 11 g/dL — ABNORMAL LOW (ref 12.0–15.0)
Immature Granulocytes: 1 %
Lymphocytes Relative: 10 %
Lymphs Abs: 1.1 10*3/uL (ref 0.7–4.0)
MCH: 29.4 pg (ref 26.0–34.0)
MCHC: 34.1 g/dL (ref 30.0–36.0)
MCV: 86.4 fL (ref 80.0–100.0)
Monocytes Absolute: 1.4 10*3/uL — ABNORMAL HIGH (ref 0.1–1.0)
Monocytes Relative: 12 %
Neutro Abs: 8.7 10*3/uL — ABNORMAL HIGH (ref 1.7–7.7)
Neutrophils Relative %: 76 %
Platelets: 290 10*3/uL (ref 150–400)
RBC: 3.74 MIL/uL — ABNORMAL LOW (ref 3.87–5.11)
RDW: 14 % (ref 11.5–15.5)
WBC: 11.4 10*3/uL — ABNORMAL HIGH (ref 4.0–10.5)
nRBC: 0 % (ref 0.0–0.2)

## 2020-08-25 LAB — COMPREHENSIVE METABOLIC PANEL
ALT: 526 U/L — ABNORMAL HIGH (ref 0–44)
AST: 385 U/L — ABNORMAL HIGH (ref 15–41)
Albumin: 2.2 g/dL — ABNORMAL LOW (ref 3.5–5.0)
Alkaline Phosphatase: 174 U/L — ABNORMAL HIGH (ref 38–126)
Anion gap: 12 (ref 5–15)
BUN: 20 mg/dL (ref 8–23)
CO2: 25 mmol/L (ref 22–32)
Calcium: 8.2 mg/dL — ABNORMAL LOW (ref 8.9–10.3)
Chloride: 103 mmol/L (ref 98–111)
Creatinine, Ser: 0.9 mg/dL (ref 0.44–1.00)
GFR, Estimated: 57 mL/min — ABNORMAL LOW (ref >60)
Glucose, Bld: 127 mg/dL — ABNORMAL HIGH (ref 70–99)
Potassium: 3.8 mmol/L (ref 3.5–5.1)
Sodium: 140 mmol/L (ref 135–145)
Total Bilirubin: 1.5 mg/dL — ABNORMAL HIGH (ref 0.3–1.2)
Total Protein: 5.6 g/dL — ABNORMAL LOW (ref 6.5–8.1)

## 2020-08-25 LAB — MAGNESIUM: Magnesium: 2.1 mg/dL (ref 1.7–2.4)

## 2020-08-25 LAB — PHOSPHORUS: Phosphorus: 3.5 mg/dL (ref 2.5–4.6)

## 2020-08-25 MED ORDER — LACTATED RINGERS IV SOLN: INTRAVENOUS | Status: AC

## 2020-08-25 NOTE — Progress Notes (Signed)
PROGRESS NOTE    [name]  [id number] DOB: [birth year] DOA: 08/17/2020 PCP: Marin Olp, MD   No chief complaint on file.  Brief Narrative:  87 year old female with recent [place], hypertension, hyperlipidemia, macular degeneration of left eye, ICH in 2016 was admitted on 08/11/2020 and discharged on 12/24 after Desi Rowe fall.  During that hospital stay CT head showed subarachnoid hemorrhage in the sulcus of left frontal lobe as well as subdural hemorrhage over the left frontoparietal convexity measuring 4 cm in maximal thickness.  Neurosurgery was consulted and recommended conservative management.  She was also found to have left elbow  fracture and orthopedics recommended sling and outpatient follow-up.  Physical therapy recommended skilled nursing facility however patient family took her home.  She was home for less than 24 hours and had persistent sleepiness, lethargy and confusion compared to baseline and she was brought back to ER.  During hospital stay she was given oxycodone and became lethargic and had worsening delirium.  She was discharged home with tramadol.  In the ED at [facility] CT scan showed 2 mm increased SAH and 3 mm midline shift compared to prior imaging, neurosurgery was consulted and recommended no surgical intervention but close monitoring was recommended.  Assessment & Plan:   Active Problems:   Altered mental status   Disorientation   LFT elevation  1. Acute metabolic encephalopathy-encephalopathy has almost resolved, likely from opioids and tramadol at home.  Pt appears close to baseline, she's anxious, but Nathanael Krist&Ox3 today, continues to be anxious today.  PT recommended CIR.  Awaiting bed at [facility]. 1. Insurance denied CIR, will need to look into SNF 2. Pending CIR  2. Traumatic SAH, SDH - due to fall that prompted her first hospitalization.  Repeat CT head showed midline shift of 2 mm at most.  Dr. Cruzita Lederer discussed the case with Dr. Marcello Moores on 12/26 who  recommended no acute intervention and recommended repeat CT head in 2 weeks.  3. Leukocytosis: mild, afebrile, continue to monitor.  Relatively stable.   4. Elevated LFT's: worsening, acute hepatitis panel negative.  Lipitor has been discontinued.  Hold additional tylenol.  will also d/c amlodipine though feel this is less likely.  1. Limited RUQ [place] -> suggests background liver disease 2. Elevated today, continue to follow  5. Acute kidney injury- ARB on hold.  Creatinine improved, today, follow.  Continue to hold ARB.  Fluctuating, follow with IVF.  6. Left elbow fracture-continue sling, orthopedic surgery to follow-up as outpatient 1.  complaining of upper extremity pain today (bilateral) no new trauma or exam findings, continue to monitor closely with voltaren   7. Hypertension-blood pressure is stable, slightly elevated today.  ARB on hold due to worsening renal function.  8. Hyperlipidemia- hold atorvastatin for now with elevated LFT's  DVT prophylaxis: SCD Code Status: full  Family Communication: daughter at bedside Disposition:   Status is: Inpatient  Remains inpatient appropriate because:Inpatient level of care appropriate due to severity of illness   Dispo: The patient is from: Home              Anticipated d/c is to: SNF              Anticipated d/c date is: > 3 days              Patient currently is not medically stable to d/c.   Consultants:   neurosurgery  Procedures:  Echo IMPRESSIONS    1. Left ventricular ejection fraction, by estimation, is  55 to 60%. The  left ventricle has normal function. The left ventricle has no regional  wall motion abnormalities. Left ventricular diastolic function could not  be evaluated.  2. Right ventricular systolic function is normal. The right ventricular  size is normal. There is normal pulmonary artery systolic pressure. The  estimated right ventricular systolic pressure is 29.4 mmHg.  3. Left atrial size was severely  dilated.  4. Right atrial size was moderately dilated.  5. The mitral valve is abnormal. Mild mitral valve regurgitation.  6. The aortic valve is tricuspid. Aortic valve regurgitation is not  visualized. Mild aortic valve sclerosis is present, with no evidence of  aortic valve stenosis.  7. The inferior vena cava is normal in size with greater than 50%  respiratory variability, suggesting right atrial pressure of 3 mmHg.  8. Cannot exclude small PFO.   Antimicrobials:  Anti-infectives (From admission, onward)   None      Subjective: Anxious today again Complains of nonspecific pain all over - knees, arms -> better after voltaren  Objective: Vitals:   08/24/20 0454 08/24/20 1350 08/24/20 1943 08/25/20 0614  BP: (!) 145/69 (!) 144/69 (!) 129/47 (!) 155/64  Pulse: 70 72 60 64  Resp: 16 17 17 17   Temp: 98.4 F (36.9 C) 97.9 F (36.6 C) 97.7 F (36.5 C) 98 F (36.7 C)  TempSrc: Oral Oral Oral Oral  SpO2: 95% 100% 90% 95%    Intake/Output Summary (Last 24 hours) at 08/25/2020 1024 Last data filed at 08/25/2020 0911 Gross per 24 hour  Intake 360 ml  Output 950 ml  Net -590 ml   There were no vitals filed for this visit.  Examination:  General: No acute distress. L periorbital swelling Cardiovascular: Heart sounds show Ahmod Gillespie regular rate, and rhythm Lungs: Clear to auscultation bilaterally Abdomen: Soft, nontender, nondistended Neurological: Alert. Moves all extremities 4. Cranial nerves II through XII grossly intact. Skin: Warm and dry. No rashes or lesions. Extremities: No clubbing or cyanosis. No edema.  L arm in sling  Data Reviewed: I have personally reviewed following labs and imaging studies  CBC: Recent Labs  Lab 08/19/20 0213 08/22/20 0119 08/23/20 0019 08/24/20 0214 08/25/20 0124  WBC 9.1 11.2* 10.2 12.6* 11.4*  NEUTROABS  --  8.3* 7.8* 10.0* 8.7*  HGB 10.8* 10.5* 10.7* 10.5* 11.0*  HCT 32.4* 29.9* 31.3* 32.2* 32.3*  MCV 85.7 84.5 86.9 85.9 86.4   PLT 189 253 275 281 290    Basic Metabolic Panel: Recent Labs  Lab 08/21/20 0132 08/22/20 0119 08/23/20 0019 08/24/20 0214 08/25/20 0124  NA 139 138 137 137 140  K 3.7 3.8 3.7 3.6 3.8  CL 106 104 102 101 103  CO2 23 24 23 24 25   GLUCOSE 118* 119* 139* 133* 127*  BUN 17 23 25* 17 20  CREATININE 0.82 1.00 1.02* 0.78 0.90  CALCIUM 8.2* 8.2* 7.9* 8.3* 8.2*  MG  --  2.1 1.9 2.0 2.1  PHOS  --  3.3 3.0 3.2 3.5    GFR: Estimated Creatinine Clearance: 26.8 mL/min (by C-G formula based on SCr of 0.9 mg/dL).  Liver Function Tests: Recent Labs  Lab 08/22/20 0119 08/23/20 0019 08/24/20 0214 08/25/20 0124  AST 165* 477* 255* 385*  ALT 252* 501* 400* 526*  ALKPHOS 132* 151* 146* 174*  BILITOT 1.2 0.8 1.2 1.5*  PROT 5.8* 5.7* 5.9* 5.6*  ALBUMIN 2.3* 2.3* 2.3* 2.2*    CBG: No results for input(s): GLUCAP in the last 168 hours.  No results found for this or any previous visit (from the past 240 hour(s)).       Radiology Studies: US Abdomen Limited RUQ (LIVER/GB)  Result Date: 08/23/2020 CLINICAL DATA:  Increased LFTs. EXAM: ULTRASOUND ABDOMEN LIMITED RIGHT UPPER QUADRANT COMPARISON:  None FINDINGS: Gallbladder: Limited visualization of the gallbladder due to difficulty in patient positioning due to altered mental status and adjacent bowel gas. Cholelithiasis, 8 mm calculus without wall thickening or pericholecystic fluid. No tenderness over the gallbladder reported by the sonographer. Common bile duct: Diameter: 1.8 mm Liver: Coarsened hepatic echotexture is suggested with very limited visualization of much of the RIGHT hepatic lobe. No focal lesion on submitted images. Mildly nodular contour. Visualized portal vein is patent with hepatopetal flow though with limited assessment. Other: None. IMPRESSION: Limited evaluation with findings that suggest background liver disease. If there is further concern for hepatic or biliary abnormality further assessment with cross-sectional  imaging either with CT or MR could be considered as warranted. Electronically Signed   By: Zetta Bills M.D.   On: 08/23/2020 15:11        Scheduled Meds: . diclofenac Sodium  2 g Topical QID  . lidocaine  1 patch Transdermal Q24H  . multivitamin with minerals  1 tablet Oral Daily   Continuous Infusions: . lactated ringers 75 mL/hr at 08/25/20 0817     LOS: 6 days    Time spent: over 30 min     Fayrene Helper, MD [facility]   To contact the attending provider between 7A-7P or the covering provider during after hours 7P-7A, please log into the web site [email or web address] and access using universal [place] password for that web site. If you do not have the password, please call the hospital operator.  08/25/2020, 10:24 AM

## 2020-08-26 ENCOUNTER — Other Ambulatory Visit: Payer: Self-pay

## 2020-08-26 ENCOUNTER — Encounter (HOSPITAL_COMMUNITY): Payer: Self-pay | Admitting: Physical Medicine & Rehabilitation

## 2020-08-26 ENCOUNTER — Inpatient Hospital Stay (HOSPITAL_COMMUNITY)
Admission: RE | Admit: 2020-08-26 | Discharge: 2020-09-12 | DRG: 945 | Disposition: A | Discharge status: Home Health | Payer: Medicare Other | Source: Intra-hospital | Attending: Physical Medicine & Rehabilitation | Admitting: Physical Medicine & Rehabilitation

## 2020-08-26 ENCOUNTER — Inpatient Hospital Stay (HOSPITAL_COMMUNITY): Payer: Medicare Other

## 2020-08-26 DIAGNOSIS — S065X0D Traumatic subdural hemorrhage without loss of consciousness, subsequent encounter: Secondary | ICD-10-CM | POA: Diagnosis not present

## 2020-08-26 DIAGNOSIS — R7989 Other specified abnormal findings of blood chemistry: Secondary | ICD-10-CM | POA: Diagnosis not present

## 2020-08-26 DIAGNOSIS — G9341 Metabolic encephalopathy: Secondary | ICD-10-CM | POA: Diagnosis present

## 2020-08-26 DIAGNOSIS — I6201 Nontraumatic acute subdural hemorrhage: Secondary | ICD-10-CM | POA: Diagnosis not present

## 2020-08-26 DIAGNOSIS — D72829 Elevated white blood cell count, unspecified: Secondary | ICD-10-CM | POA: Diagnosis not present

## 2020-08-26 DIAGNOSIS — D62 Acute posthemorrhagic anemia: Secondary | ICD-10-CM | POA: Diagnosis not present

## 2020-08-26 DIAGNOSIS — L89890 Pressure ulcer of other site, unstageable: Secondary | ICD-10-CM | POA: Diagnosis present

## 2020-08-26 DIAGNOSIS — N179 Acute kidney failure, unspecified: Secondary | ICD-10-CM | POA: Diagnosis not present

## 2020-08-26 DIAGNOSIS — S52132D Displaced fracture of neck of left radius, subsequent encounter for closed fracture with routine healing: Secondary | ICD-10-CM

## 2020-08-26 DIAGNOSIS — I129 Hypertensive chronic kidney disease with stage 1 through stage 4 chronic kidney disease, or unspecified chronic kidney disease: Secondary | ICD-10-CM | POA: Diagnosis not present

## 2020-08-26 DIAGNOSIS — R52 Pain, unspecified: Secondary | ICD-10-CM

## 2020-08-26 DIAGNOSIS — L8990 Pressure ulcer of unspecified site, unspecified stage: Secondary | ICD-10-CM | POA: Insufficient documentation

## 2020-08-26 DIAGNOSIS — R600 Localized edema: Secondary | ICD-10-CM | POA: Diagnosis not present

## 2020-08-26 DIAGNOSIS — S065XAA Traumatic subdural hemorrhage with loss of consciousness status unknown, initial encounter: Secondary | ICD-10-CM | POA: Diagnosis present

## 2020-08-26 DIAGNOSIS — H3530 Unspecified macular degeneration: Secondary | ICD-10-CM | POA: Diagnosis present

## 2020-08-26 DIAGNOSIS — M8580 Other specified disorders of bone density and structure, unspecified site: Secondary | ICD-10-CM | POA: Diagnosis present

## 2020-08-26 DIAGNOSIS — Z6830 Body mass index (BMI) 30.0-30.9, adult: Secondary | ICD-10-CM | POA: Diagnosis not present

## 2020-08-26 DIAGNOSIS — S065X9D Traumatic subdural hemorrhage with loss of consciousness of unspecified duration, subsequent encounter: Principal | ICD-10-CM

## 2020-08-26 DIAGNOSIS — S065X9A Traumatic subdural hemorrhage with loss of consciousness of unspecified duration, initial encounter: Principal | ICD-10-CM | POA: Diagnosis present

## 2020-08-26 DIAGNOSIS — Z888 Allergy status to other drugs, medicaments and biological substances status: Secondary | ICD-10-CM

## 2020-08-26 DIAGNOSIS — L89892 Pressure ulcer of other site, stage 2: Secondary | ICD-10-CM | POA: Diagnosis not present

## 2020-08-26 DIAGNOSIS — I480 Paroxysmal atrial fibrillation: Secondary | ICD-10-CM | POA: Diagnosis not present

## 2020-08-26 DIAGNOSIS — R471 Dysarthria and anarthria: Secondary | ICD-10-CM | POA: Diagnosis not present

## 2020-08-26 DIAGNOSIS — Z79899 Other long term (current) drug therapy: Secondary | ICD-10-CM | POA: Diagnosis not present

## 2020-08-26 DIAGNOSIS — E039 Hypothyroidism, unspecified: Secondary | ICD-10-CM | POA: Diagnosis present

## 2020-08-26 DIAGNOSIS — L8995 Pressure ulcer of unspecified site, unstageable: Secondary | ICD-10-CM

## 2020-08-26 DIAGNOSIS — W19XXXD Unspecified fall, subsequent encounter: Secondary | ICD-10-CM | POA: Diagnosis present

## 2020-08-26 DIAGNOSIS — R29810 Facial weakness: Secondary | ICD-10-CM | POA: Diagnosis not present

## 2020-08-26 DIAGNOSIS — E785 Hyperlipidemia, unspecified: Secondary | ICD-10-CM

## 2020-08-26 DIAGNOSIS — G928 Other toxic encephalopathy: Secondary | ICD-10-CM | POA: Diagnosis not present

## 2020-08-26 DIAGNOSIS — R0989 Other specified symptoms and signs involving the circulatory and respiratory systems: Secondary | ICD-10-CM | POA: Diagnosis not present

## 2020-08-26 DIAGNOSIS — R7401 Elevation of levels of liver transaminase levels: Secondary | ICD-10-CM | POA: Diagnosis not present

## 2020-08-26 DIAGNOSIS — S065X2D Traumatic subdural hemorrhage with loss of consciousness of 31 minutes to 59 minutes, subsequent encounter: Secondary | ICD-10-CM | POA: Diagnosis not present

## 2020-08-26 DIAGNOSIS — I10 Essential (primary) hypertension: Secondary | ICD-10-CM | POA: Diagnosis not present

## 2020-08-26 DIAGNOSIS — Z8673 Personal history of transient ischemic attack (TIA), and cerebral infarction without residual deficits: Secondary | ICD-10-CM | POA: Diagnosis not present

## 2020-08-26 DIAGNOSIS — R4182 Altered mental status, unspecified: Secondary | ICD-10-CM | POA: Diagnosis not present

## 2020-08-26 DIAGNOSIS — S52122D Displaced fracture of head of left radius, subsequent encounter for closed fracture with routine healing: Secondary | ICD-10-CM | POA: Diagnosis not present

## 2020-08-26 DIAGNOSIS — N183 Chronic kidney disease, stage 3 unspecified: Secondary | ICD-10-CM | POA: Diagnosis present

## 2020-08-26 DIAGNOSIS — E8809 Other disorders of plasma-protein metabolism, not elsewhere classified: Secondary | ICD-10-CM

## 2020-08-26 DIAGNOSIS — S066X9D Traumatic subarachnoid hemorrhage with loss of consciousness of unspecified duration, subsequent encounter: Secondary | ICD-10-CM

## 2020-08-26 DIAGNOSIS — E854 Organ-limited amyloidosis: Secondary | ICD-10-CM | POA: Diagnosis not present

## 2020-08-26 DIAGNOSIS — I6389 Other cerebral infarction: Secondary | ICD-10-CM | POA: Diagnosis not present

## 2020-08-26 DIAGNOSIS — E46 Unspecified protein-calorie malnutrition: Secondary | ICD-10-CM | POA: Diagnosis not present

## 2020-08-26 DIAGNOSIS — S065X2S Traumatic subdural hemorrhage with loss of consciousness of 31 minutes to 59 minutes, sequela: Secondary | ICD-10-CM | POA: Diagnosis not present

## 2020-08-26 DIAGNOSIS — G319 Degenerative disease of nervous system, unspecified: Secondary | ICD-10-CM | POA: Diagnosis not present

## 2020-08-26 DIAGNOSIS — S065X9S Traumatic subdural hemorrhage with loss of consciousness of unspecified duration, sequela: Secondary | ICD-10-CM | POA: Diagnosis not present

## 2020-08-26 DIAGNOSIS — S52121S Displaced fracture of head of right radius, sequela: Secondary | ICD-10-CM | POA: Diagnosis not present

## 2020-08-26 DIAGNOSIS — I609 Nontraumatic subarachnoid hemorrhage, unspecified: Secondary | ICD-10-CM | POA: Diagnosis not present

## 2020-08-26 DIAGNOSIS — S065X0A Traumatic subdural hemorrhage without loss of consciousness, initial encounter: Secondary | ICD-10-CM

## 2020-08-26 DIAGNOSIS — R404 Transient alteration of awareness: Secondary | ICD-10-CM | POA: Diagnosis not present

## 2020-08-26 DIAGNOSIS — N6489 Other specified disorders of breast: Secondary | ICD-10-CM | POA: Diagnosis not present

## 2020-08-26 DIAGNOSIS — S52121A Displaced fracture of head of right radius, initial encounter for closed fracture: Secondary | ICD-10-CM

## 2020-08-26 DIAGNOSIS — R978 Other abnormal tumor markers: Secondary | ICD-10-CM

## 2020-08-26 LAB — COMPREHENSIVE METABOLIC PANEL
ALT: 366 U/L — ABNORMAL HIGH (ref 0–44)
AST: 193 U/L — ABNORMAL HIGH (ref 15–41)
Albumin: 2 g/dL — ABNORMAL LOW (ref 3.5–5.0)
Alkaline Phosphatase: 146 U/L — ABNORMAL HIGH (ref 38–126)
Anion gap: 12 (ref 5–15)
BUN: 24 mg/dL — ABNORMAL HIGH (ref 8–23)
CO2: 25 mmol/L (ref 22–32)
Calcium: 8.1 mg/dL — ABNORMAL LOW (ref 8.9–10.3)
Chloride: 104 mmol/L (ref 98–111)
Creatinine, Ser: 0.98 mg/dL (ref 0.44–1.00)
GFR, Estimated: 52 mL/min — ABNORMAL LOW (ref >60)
Glucose, Bld: 111 mg/dL — ABNORMAL HIGH (ref 70–99)
Potassium: 3.8 mmol/L (ref 3.5–5.1)
Sodium: 141 mmol/L (ref 135–145)
Total Bilirubin: 0.6 mg/dL (ref 0.3–1.2)
Total Protein: 5.4 g/dL — ABNORMAL LOW (ref 6.5–8.1)

## 2020-08-26 LAB — CBC WITH DIFFERENTIAL/PLATELET
Abs Immature Granulocytes: 0.11 10*3/uL — ABNORMAL HIGH (ref 0.00–0.07)
Basophils Absolute: 0 10*3/uL (ref 0.0–0.1)
Basophils Relative: 0 %
Eosinophils Absolute: 0.1 10*3/uL (ref 0.0–0.5)
Eosinophils Relative: 1 %
HCT: 30.9 % — ABNORMAL LOW (ref 36.0–46.0)
Hemoglobin: 10.1 g/dL — ABNORMAL LOW (ref 12.0–15.0)
Immature Granulocytes: 1 %
Lymphocytes Relative: 12 %
Lymphs Abs: 1.3 10*3/uL (ref 0.7–4.0)
MCH: 28.4 pg (ref 26.0–34.0)
MCHC: 32.7 g/dL (ref 30.0–36.0)
MCV: 86.8 fL (ref 80.0–100.0)
Monocytes Absolute: 1.4 10*3/uL — ABNORMAL HIGH (ref 0.1–1.0)
Monocytes Relative: 12 %
Neutro Abs: 8.3 10*3/uL — ABNORMAL HIGH (ref 1.7–7.7)
Neutrophils Relative %: 74 %
Platelets: 301 10*3/uL (ref 150–400)
RBC: 3.56 MIL/uL — ABNORMAL LOW (ref 3.87–5.11)
RDW: 14.1 % (ref 11.5–15.5)
WBC: 11.3 10*3/uL — ABNORMAL HIGH (ref 4.0–10.5)
nRBC: 0 % (ref 0.0–0.2)

## 2020-08-26 LAB — MAGNESIUM: Magnesium: 2.1 mg/dL (ref 1.7–2.4)

## 2020-08-26 LAB — PHOSPHORUS: Phosphorus: 3.1 mg/dL (ref 2.5–4.6)

## 2020-08-26 MED ORDER — IPRATROPIUM-ALBUTEROL 0.5-2.5 (3) MG/3ML IN SOLN: 3.0000 mL | RESPIRATORY_TRACT | Status: DC | PRN

## 2020-08-26 MED ORDER — LIDOCAINE 5 % EX PTCH
1.0000 | MEDICATED_PATCH | CUTANEOUS | Status: DC
  Administered 2020-08-27 – 2020-09-07 (×9): 1 via TRANSDERMAL
  Filled 2020-08-26 (×13): qty 1

## 2020-08-26 MED ORDER — DICLOFENAC SODIUM 1 % EX GEL
2.0000 g | Freq: Four times a day (QID) | CUTANEOUS | Status: DC
  Administered 2020-08-26 – 2020-09-11 (×46): 2 g via TOPICAL
  Filled 2020-08-26 (×2): qty 100

## 2020-08-26 MED ORDER — ADULT MULTIVITAMIN W/MINERALS CH
1.0000 | ORAL_TABLET | Freq: Every day | ORAL | Status: DC
  Administered 2020-08-27 – 2020-09-12 (×17): 1 via ORAL
  Filled 2020-08-26 (×17): qty 1

## 2020-08-26 NOTE — Progress Notes (Signed)
PROGRESS NOTE    [name]  [id number] DOB: August 13, 1918 DOA: 08/17/2020 PCP: Carolyn Olp, MD   No chief complaint on file.  Brief Narrative:  87 year old female with recent [place], hypertension, hyperlipidemia, macular degeneration of left eye, ICH in 2016 was admitted on 08/11/2020 and discharged on 12/24 after Mckaylin Bastien fall.  During that hospital stay CT head showed subarachnoid hemorrhage in the sulcus of left frontal lobe as well as subdural hemorrhage over the left frontoparietal convexity measuring 4 cm in maximal thickness.  Neurosurgery was consulted and recommended conservative management.  She was also found to have left elbow  fracture and orthopedics recommended sling and outpatient follow-up.  Physical therapy recommended skilled nursing facility however patient family took her home.  She was home for less than 24 hours and had persistent sleepiness, lethargy and confusion compared to baseline and she was brought back to ER.  During hospital stay she was given oxycodone and became lethargic and had worsening delirium.  She was discharged home with tramadol.  In the ED at [facility] CT scan showed 2 mm increased SAH and 3 mm midline shift compared to prior imaging, neurosurgery was consulted and recommended no surgical intervention but close monitoring was recommended.  Assessment & Plan:   Active Problems:   Altered mental status   Disorientation   LFT elevation  1. Acute metabolic encephalopathy-encephalopathy has almost resolved, likely from opioids and tramadol at home.  Pt appears close to baseline, she's anxious, but [name]&Ox3 today, continues to be anxious today.  PT recommended CIR.  Awaiting bed at [facility]. 1. Insurance denied CIR, will need to look into SNF 2. Pending CIR  2. Traumatic SAH, SDH - due to fall that prompted her first hospitalization.  Repeat CT head showed midline shift of 2 mm at most.  Dr. Cruzita [name] discussed the case with Dr. Marcello [name] on 12/26 who  recommended no acute intervention and recommended repeat CT head in 2 weeks.  3. Leukocytosis: mild, afebrile, continue to monitor.  Relatively stable.   4. Elevated LFT's: worsening, acute hepatitis panel negative.  Lipitor has been discontinued.  Hold additional tylenol.  will also d/c amlodipine though feel this is less likely.  1. Limited RUQ [place] -> suggests background liver disease 2. Improving, follow   5. Acute kidney injury- ARB on hold.  Creatinine improved, today, follow.  Continue to hold ARB.  Fluctuating, follow with IVF.  6. Left elbow fracture-continue sling, orthopedic surgery to follow-up as outpatient 1.  complaining of upper extremity pain today (bilateral) no new trauma or exam findings, continue to monitor closely with voltaren   7. Hypertension-blood pressure is stable, slightly elevated today.  ARB on hold due to worsening renal function.  8. Hyperlipidemia- hold atorvastatin for now with elevated LFT's  9. Lower Extremity TTP: bilateral calf tenderness she notes since fall, follow LE US  DVT prophylaxis: SCD Code Status: full  Family Communication: daughter at bedside Disposition:   Status is: Inpatient  Remains inpatient appropriate because:Inpatient level of care appropriate due to severity of illness   Dispo: The patient is from: Home              Anticipated d/c is to: SNF              Anticipated d/c date is: > 3 days              Patient currently is not medically stable to d/c.   Consultants:   neurosurgery  Procedures:  Echo IMPRESSIONS    1. Left ventricular ejection fraction, by estimation, is 55 to 60%. The  left ventricle has normal function. The left ventricle has no regional  wall motion abnormalities. Left ventricular diastolic function could not  be evaluated.  2. Right ventricular systolic function is normal. The right ventricular  size is normal. There is normal pulmonary artery systolic pressure. The  estimated right  ventricular systolic pressure is 29.4 mmHg.  3. Left atrial size was severely dilated.  4. Right atrial size was moderately dilated.  5. The mitral valve is abnormal. Mild mitral valve regurgitation.  6. The aortic valve is tricuspid. Aortic valve regurgitation is not  visualized. Mild aortic valve sclerosis is present, with no evidence of  aortic valve stenosis.  7. The inferior vena cava is normal in size with greater than 50%  respiratory variability, suggesting right atrial pressure of 3 mmHg.  8. Cannot exclude small PFO.   Antimicrobials:  Anti-infectives (From admission, onward)   None      Subjective: Can't believe it's snowing No other complaints  Objective: Vitals:   08/25/20 0614 08/25/20 1602 08/25/20 1927 08/26/20 0348  BP: (!) 155/64 (!) 133/59 128/60 (!) 130/59  Pulse: 64 (!) 58 64 65  Resp: 17 20 18 17   Temp: 98 F (36.7 C)  98.1 F (36.7 C) 98 F (36.7 C)  TempSrc: Oral  Oral Oral  SpO2: 95% 99% 99% 99%    Intake/Output Summary (Last 24 hours) at 08/26/2020 10/24/2020 Last data filed at 08/26/2020 10/24/2020 Gross per 24 hour  Intake 480 ml  Output --  Net 480 ml   There were no vitals filed for this visit.  Examination:  General: No acute distress. Cardiovascular: Heart sounds show [name] regular rate, and rhythm Lungs: Clear to auscultation bilaterally  Abdomen: Soft, nontender, nondistended Neurological: Alert and oriented 3. Moves all extremities 4 . Cranial nerves II through XII grossly intact. Skin: Warm and dry. No rashes or lesions. Extremities: bilateral calf TTP  Data Reviewed: I have personally reviewed following labs and imaging studies  CBC: Recent Labs  Lab 08/22/20 0119 08/23/20 0019 08/24/20 0214 08/25/20 0124 08/26/20 0150  WBC 11.2* 10.2 12.6* 11.4* 11.3*  NEUTROABS 8.3* 7.8* 10.0* 8.7* 8.3*  HGB 10.5* 10.7* 10.5* 11.0* 10.1*  HCT 29.9* 31.3* 32.2* 32.3* 30.9*  MCV 84.5 86.9 85.9 86.4 86.8  PLT 253 275 281 290 301    Basic  Metabolic Panel: Recent Labs  Lab 08/22/20 0119 08/23/20 0019 08/24/20 0214 08/25/20 0124 08/26/20 0150  NA 138 137 137 140 141  K 3.8 3.7 3.6 3.8 3.8  CL 104 102 101 103 104  CO2 24 23 24 25 25   GLUCOSE 119* 139* 133* 127* 111*  BUN 23 25* 17 20 24*  CREATININE 1.00 1.02* 0.78 0.90 0.98  CALCIUM 8.2* 7.9* 8.3* 8.2* 8.1*  MG 2.1 1.9 2.0 2.1 2.1  PHOS 3.3 3.0 3.2 3.5 3.1    GFR: Estimated Creatinine Clearance: 24.6 mL/min (by C-G formula based on SCr of 0.98 mg/dL).  Liver Function Tests: Recent Labs  Lab 08/22/20 0119 08/23/20 0019 08/24/20 0214 08/25/20 0124 08/26/20 0150  AST 165* 477* 255* 385* 193*  ALT 252* 501* 400* 526* 366*  ALKPHOS 132* 151* 146* 174* 146*  BILITOT 1.2 0.8 1.2 1.5* 0.6  PROT 5.8* 5.7* 5.9* 5.6* 5.4*  ALBUMIN 2.3* 2.3* 2.3* 2.2* 2.0*    CBG: No results for input(s): GLUCAP in the last 168 hours.   No results  found for this or any previous visit (from the past 240 hour(s)).       Radiology Studies: No results found.      Scheduled Meds: . diclofenac Sodium  2 g Topical QID  . lidocaine  1 patch Transdermal Q24H  . multivitamin with minerals  1 tablet Oral Daily   Continuous Infusions:    LOS: 7 days    Time spent: over 30 min     Fayrene Helper, MD [facility]   To contact the attending provider between 7A-7P or the covering provider during after hours 7P-7A, please log into the web site [email or web address] and access using universal [place] password for that web site. If you do not have the password, please call the hospital operator.  08/26/2020, 9:18 AM

## 2020-08-26 NOTE — Discharge Summary (Signed)
Physician Discharge Summary  [name] [id number] DOB: 1918/03/29 DOA: 08/17/2020  PCP: Marin Olp, MD  Admit date: 08/17/2020 Discharge date: 08/26/2020  Time spent: 40 minutes  Recommendations for Outpatient Follow-up:  1. Follow CBC/CMP in CIR (attention to LFT's) 2. Follow venous ultrasound of lower extremities in CIR (ordered 1/3 for bilateral calf tenderness) 3.  Needs repeat CT scan around 09/01/20 4. Blood pressure medicines d/c'd - follow BP - she's tolerated this well  5. Continue sling immobilization to left arm, follow up with orthopedics, Dr. Lorin Mercy in clinic   Discharge Diagnoses:  Active Problems:   Altered mental status   Disorientation   LFT elevation   Discharge Condition: stable  Diet recommendation: heart healthy  There were no vitals filed for this visit.  History of present illness:  87 year old female with recent [place], hypertension, hyperlipidemia, macular degeneration of left eye, ICH in 2016 wasadmittedon 08/11/2020 and discharged on 12/24 after Krystelle Prashad fall. During that hospital stay CT head showed subarachnoid hemorrhage in the sulcus of left frontal lobe as well as subdural hemorrhage over the left frontoparietal convexity measuring 4 cm in maximal thickness. Neurosurgery was consulted and recommended conservative management. She was also found to have left elbow fracture and orthopedics recommended sling and outpatient follow-up. Physical therapy recommended skilled nursing facility however patient family took her home. She was home for less than 24 hours and had persistent sleepiness, lethargy and confusion compared to baseline and she was brought back to ER. During hospital stay she was given oxycodone and became lethargic and had worsening delirium. She was discharged home with tramadol. In the ED at [facility] CT scan showed 2 mm increased SAH and 3 mm midline shift compared to prior imaging, neurosurgery was consulted and  recommended no surgical intervention but close monitoring was recommended.  She's improved from mental status standpoint (thought 2/2 medications - tramadol/oxycodone, as well as her recent hospitalization/trauma).  She's improved.  Plan at this point is for CIR today 1/3.  See below for additional details.  Hospital Course:  1. Acute metabolic encephalopathy-encephalopathy has almost resolved, likely from opioids and tramadol at home. Pt appears close to baseline, she's anxious, but Aceson Labell&Ox3 today. PT recommended CIR. Awaiting bed at CIR. 1. Insurance denied CIR, will need to look into SNF 2. Pending CIR -> bed today, 1/3, will transfer at this time  2. Traumatic SAH, SDH - due to fall that prompted her first hospitalization. Repeat CT head showed midline shift of 2 mm at most. Dr. Cruzita Lederer discussed the case with Dr. Marcello Moores on 12/26 who recommended no acute intervention and recommended repeat CT head in 2 weeks (around 09/01/2020). 1. Discussed with Dr. Zada Finders today, 08/26/2020, who noted ok for pharmacologic dvt ppx at this time as indicated  3. Lower Extremity TTP: bilateral calf tenderness she notes since fall, follow LE [place]  4. Leukocytosis: mild, afebrile, continue to monitor.  Relatively stable.   4. Elevated LFT's: worsening, acute hepatitis panel negative.  Lipitor has been discontinued.  Hold additional tylenol.  will also d/c amlodipine though feel this is less likely.  1. Limited RUQ [place] -> suggests background liver disease 2. Improving, follow   5. Acute kidney injury- ARB on hold.Creatinine improved, today, follow.  Continue to hold ARB.  Fluctuating, follow with IVF.  6. Left elbow fracture-continue sling, orthopedic surgery to follow-up as outpatient 1.  complaining of upper extremity pain today (bilateral) no new trauma or exam findings, continue to monitor closely with voltaren  7. Hypertension-blood pressure is stable, slightly elevated today. ARB on hold due to  worsening renal function.  BP meds on hold, tolerating this well  8. Hyperlipidemia- hold atorvastatin for now with elevated LFT's   Procedures:  none  Consultations:  Neurosurgery over phone  Discharge Exam: Vitals:   08/25/20 1927 08/26/20 0348  BP: 128/60 (!) 130/59  Pulse: 64 65  Resp: 18 17  Temp: 98.1 F (36.7 C) 98 F (36.7 C)  SpO2: 99% 99%   See progress note for exam from today  Discharge Instructions   Discharge Instructions    Call MD for:  difficulty breathing, headache or visual disturbances   Complete by: As directed    Call MD for:  extreme fatigue   Complete by: As directed    Call MD for:  hives   Complete by: As directed    Call MD for:  persistant dizziness or light-headedness   Complete by: As directed    Call MD for:  persistant nausea and vomiting   Complete by: As directed    Call MD for:  redness, tenderness, or signs of infection (pain, swelling, redness, odor or green/yellow discharge around incision site)   Complete by: As directed    Call MD for:  severe uncontrolled pain   Complete by: As directed    Call MD for:  temperature >100.4   Complete by: As directed    Diet - low sodium heart healthy   Complete by: As directed    Discharge instructions   Complete by: As directed    You were seen for confusion and lethargy.   This was thought to be related to your medicines.  We've discontinued those and you have improved.    You're going to be discharged to inpatient rehab.   You'll need Elica Almas repeat CT scan around 09/01/2020 for your subarachnoid hemorrhage and your subdural.  Please follow up with neurosurgery outpatient.  Your liver enzymes were elevated.  Hold off on any tylenol or resuming your statin.  Follow this up with repeat labs.  Your blood pressure medicines are currently on hold.  Your blood pressure looks pretty good with this.  Follow your blood pressure outpatient, you maybe able to discontinue these long term.  Because  of your leg tenderness, we've ordered an ultrasound of your legs.  They can follow this up in inpatient rehab.  Return for new, recurrent, or worsening symptoms.  Please ask your PCP to request records from this hospitalization so they know what was done and what the next steps will be.   Increase activity slowly   Complete by: As directed    No wound care   Complete by: As directed      Allergies as of 08/26/2020      Reactions   Ace Inhibitors    REACTION: angioedema   Other    Blood Thinners Cause Brain Bleeds      Medication List    STOP taking these medications   acetaminophen 500 MG tablet Commonly known as: TYLENOL   amLODipine 5 MG tablet Commonly known as: NORVASC   atorvastatin 40 MG tablet Commonly known as: LIPITOR   losartan 100 MG tablet Commonly known as: COZAAR   traMADol 50 MG tablet Commonly known as: ULTRAM     TAKE these medications   multivitamin tablet Take 1 tablet by mouth daily.      Allergies  Allergen Reactions  . Ace Inhibitors     REACTION: angioedema  .  Other     Blood Thinners Cause Brain Bleeds      The results of significant diagnostics from this hospitalization (including imaging, microbiology, ancillary and laboratory) are listed below for reference.    Significant Diagnostic Studies: DG Chest 1 View  Result Date: 08/17/2020 CLINICAL DATA:  Altered mental status EXAM: CHEST  1 VIEW COMPARISON:  08/12/2020 FINDINGS: Mild cardiomegaly. Atherosclerotic calcification of the aortic knob. Calcification of the mitral annulus. No focal airspace consolidation, pleural effusion, or pneumothorax. IMPRESSION: No active disease. Electronically Signed   By: Davina Poke D.O.   On: 08/17/2020 12:24   DG Chest 1 View  Result Date: 08/12/2020 CLINICAL DATA:  Pneumonia EXAM: CHEST  1 VIEW COMPARISON:  03/28/2015 FINDINGS: Borderline heart size, stable. Mitral annular calcification. Artifact from EKG leads. There is no edema,  convincing consolidation, effusion, or pneumothorax. IMPRESSION: No convincing pneumonia. Electronically Signed   By: Monte Fantasia M.D.   On: 08/12/2020 07:41   DG Elbow 2 Views Left  Result Date: 08/13/2020 CLINICAL DATA:  Elbow pain EXAM: LEFT ELBOW - 2 VIEW COMPARISON:  08/11/2020 FINDINGS: There are changes consistent with prior fracture of the radial head. The fracture fragment is well corticated and likely of Adonnis Salceda subacute to chronic nature. Small joint effusion is noted new from the prior exam. No other focal abnormality is noted. IMPRESSION: Changes consistent with prior subacute to chronic fracture in the radial head. Small joint effusion is noted. Electronically Signed   By: Inez Catalina M.D.   On: 08/13/2020 13:24   DG Wrist 2 Views Left  Result Date: 08/14/2020 CLINICAL DATA:  One 87 year old female with left wrist pain. EXAM: LEFT WRIST - 2 VIEW COMPARISON:  None. FINDINGS: Old-appearing fracture of the distal radial metaphysis. Clinical correlation is recommended. No definite acute fracture. The bones are osteopenic. There is no dislocation. Degenerative changes of the wrist as well as osteoarthritic changes of the base of the thumb. There is positive ulnar variance. The soft tissues are unremarkable. IMPRESSION: 1. No definite acute fracture or dislocation. 2. Old-appearing fracture of the distal radial metaphysis. Electronically Signed   By: Anner Crete M.D.   On: 08/14/2020 19:01   CT HEAD WO CONTRAST  Result Date: 08/18/2020 CLINICAL DATA:  Follow-up suspected cerebral hemorrhage EXAM: CT HEAD WITHOUT CONTRAST TECHNIQUE: Contiguous axial images were obtained from the base of the skull through the vertex without intravenous contrast. COMPARISON:  Six days ago FINDINGS: Brain: Small volume subarachnoid hemorrhage along the left frontal convexity, non progressed. There is also small volumes and stable subarachnoid blood at the right occipital lobe. Subdural hematoma with high  and low-density components along the left cerebral convexity, with thickness increase in the left parietal region where it measures 8 mm. The increased from prior is mainly low-density. Thin component along the tentorium does not appear progressed. There is slight rightward midline shift which measures 2 mm at most. Small volume intraventricular hemorrhage at the exit ule horn of the left lateral ventricle. Stable encephalomalacia in the lateral right temporal lobe related to large volume hemorrhage based on Jaella Weinert 2016 MRI. Right parafalcine meningioma anteriorly and measuring 9 mm. Vascular: No hyperdense vessel or unexpected calcification. Skull: Normal. Negative for fracture or focal lesion. Sinuses/Orbits: No acute finding. IMPRESSION: 1. Subdural hematoma along the left cerebral convexity with increased low-density component in the parietal region. The collection measures up to 8 mm in thickness; 2 mm of midline shift. 2. Unchanged subarachnoid hemorrhage. Small volume intraventricular hemorrhage is  now seen. Electronically Signed   By: Monte Fantasia M.D.   On: 08/18/2020 08:52   CT HEAD WO CONTRAST  Result Date: 08/12/2020 CLINICAL DATA:  Subdural hematoma. EXAM: CT HEAD WITHOUT CONTRAST TECHNIQUE: Contiguous axial images were obtained from the base of the skull through the vertex without intravenous contrast. COMPARISON:  Head CT performed earlier today 08/12/2020. FINDINGS: Brain: Moderate generalized parenchymal atrophy. Stable small volume acute subarachnoid hemorrhage scattered along the left cerebral hemisphere. Stable left hemispheric subdural hematoma greatest along the left frontoparietal and temporal lobes measuring up to 4 mm in greatest thickness. Minimal mass effect upon the underlying left cerebral hemisphere. No midline shift. Stable thin acute subdural hemorrhage along the left tentorium, measuring 3 mm in greatest thickness. Redemonstrated 12 mm dural-based mass along the right aspect of  the anterior falx compatible with meningioma. Redemonstrated chronic right temporal lobe encephalomalacia with ex vacuo dilatation of the right temporal horn. Unchanged subtle chronic gyriform hyperdensity within the right occipital lobe which may reflect cortical laminar necrosis. Ill-defined hypoattenuation within the cerebral white matter is nonspecific, but compatible with chronic small vessel ischemic disease. No acute demarcated cortical infarction Vascular: No hyperdense vessel.  Atherosclerotic calcifications. Skull: No calvarial fracture. Nonspecific 2.1 cm lucent expansile lesion within the glabellar process of the frontal bone. Sinuses/Orbits: Visualized orbits show no acute finding. Mild ethmoid sinus mucosal thickening. IMPRESSION: Stable examination as compared to the head CT performed earlier today at 12:01 Shenaya Lebo.m. Small-volume acute subarachnoid hemorrhage along the left frontal lobe. Acute left subdural hemorrhage greatest along the left frontoparietal and temporal lobes, as well as left tentorium (measuring up to 4 mm). No midline shift. 12 mm right anterior parafalcine meningioma. Chronic right temporal lobe encephalomalacia. Generalized atrophy and chronic small vessel ischemic disease. Electronically Signed   By: Kellie Simmering DO   On: 08/12/2020 07:49   CT HEAD WO CONTRAST  Result Date: 08/12/2020 CLINICAL DATA:  Fall, left supraorbital laceration EXAM: CT HEAD WITHOUT CONTRAST CT MAXILLOFACIAL WITHOUT CONTRAST CT CERVICAL SPINE WITHOUT CONTRAST TECHNIQUE: Multidetector CT imaging of the head, cervical spine, and maxillofacial structures were performed using the standard protocol without intravenous contrast. Multiplanar CT image reconstructions of the cervical spine and maxillofacial structures were also generated. COMPARISON:  MR cervical spine 06/23/2010, CT head 01/31/2020, MR/MRA head 03/20/2015 FINDINGS: CT HEAD FINDINGS Brain: Hyperdense subarachnoid hemorrhage within the sulci of the  left frontal lobe (3/16). Subdural collection over the left frontoparietal convexity measuring up to 4 mm in maximal thickness (3/14). Trace subdural hemorrhage layering along the left tentorium up to 3 mm in maximal thickness (6/36). No other concerning sites of hemorrhage. Cortical hyperattenuation along the mesial aspect of the right occipital lobe is present on comparison and may reflect sequela of prior vascular insult. This is in addition to Keilin Gamboa stable right temporal region of encephalomalacia with ex vacuo dilatation of the right temporal horn. Stable right para falcine lesion measuring 5 x 11 mm, likely Tahsin Benyo small meningioma with small dural tails. Additional features of moderate diffuse parenchymal volume loss. Patchy areas of white matter hypoattenuation are most compatible with chronic microvascular angiopathy. No midline shift or transtentorial herniation. No CT evidence of large territory infarct. Basal cisterns are patent. Vascular: Atherosclerotic calcification of the carotid siphons and intradural vertebral arteries. No hyperdense vessel. Skull: Left frontal and supraorbital scalp swelling with laceration extending into the periorbital soft tissues, as below. No visible calvarial fracture. No other significant sites of scalp contusion or hematoma. Other: None. CT MAXILLOFACIAL FINDINGS  Osseous: The osseous structures appear diffusely demineralized which may limit detection of small or nondisplaced fractures. No fracture of the bony orbits. Nasal bones are intact. There is Cotey Rakes ill-defined, slightly expansile lesion measuring approximately 9 x 10 x31 mm (4/19, 10/39) centered in the anteroinferior glabellar process of the frontal bone. Comparison to prior MR and CT imaging reveals Shavonna Corella similar appearance on prior studies. No other mid face fractures are seen. The pterygoid plates are intact. No visible or suspected temporal bone fractures. Temporomandibular joints are normally aligned. The mandible is intact. No  fractured or avulsed teeth. Scattered carious lesions and few apical lucencies of the maxillary and mandibular dentition. Orbits: Left supraorbital/periorbital soft tissue swelling, with laceration and few punctate foci of soft tissue gas confined to the preseptal soft tissues. No retro septal gas, stranding or hemorrhage. The globes appear grossly normal and symmetric with prior bilateral lens extractions. Likely benign scleral calcification on the right. Symmetric appearance of the extraocular musculature and optic nerve sheath complexes. Normal caliber of the superior ophthalmic veins. Sinuses: Paranasal sinuses and mastoid air cells are predominantly clear. Middle ear cavities are clear. Debris in the bilateral external auditory canals. Ossicular chains are normally configured. Soft tissues: Left supraorbital/periorbital soft tissue swelling and laceration. No other significant soft tissue findings, gas or foreign body. CT CERVICAL SPINE FINDINGS Alignment: Stabilization collar absent. Mild rightward lateral flexion and cranial rotation. Straightening of the normal cervical lordosis with some focal reversal centered at the C4 level. Stepwise anterolisthesis C2-C4 is favored to be on Kenon Delashmit spondylitic basis. Additional anterolisthesis C7 on T1 also likely degenerative and not significantly changed comparison Skull base and vertebrae: The osseous structures appear diffusely demineralized which may limit detection of small or nondisplaced fractures. No acute skull base fracture. No vertebral body fracture or height loss. Progressive spondylitic fusion across the C4-5 levels. Multilevel spondylitic changes throughout the cervical spine as detailed below. Additional moderate to severe arthrosis at the atlantodental and basion dens intervals. No suspicious lytic or blastic lesions in the cervical spine. Soft tissues and spinal canal: No pre or paravertebral fluid or swelling. No visible canal hematoma. Disc levels:  Multilevel intervertebral disc height loss with spondylitic endplate changes., larger disc osteophyte complexes and posterior osseous ridging is pronounced at C3-4, C4-5, C5-6 and C6-7 resulting in mild-to-moderate canal stenosis at these levels. Additional diffuse uncinate spurring and facet hypertrophic changes result in mild-to-moderate multilevel neural foraminal narrowing with more moderate to severe foraminal narrowing on the left at C3-4, C4-5. Upper chest: Biapical pleuroparenchymal scarring. Calcification of the proximal great vessels and cervical carotids, particular at the bifurcations. Normal thyroid. Other: None. IMPRESSION: 1. Hyperdense subarachnoid hemorrhage within the sulci of the left frontal lobe. Subdural hemorrhage over the left frontoparietal convexity measuring up to 4 mm in maximal thickness. Trace subdural hemorrhage layering along the left tentorium up to 3 mm in maximal thickness. 2. Left frontal and supraorbital scalp swelling and laceration extending. No visible calvarial or acute facial bone fracture. 3. Encephalomalacia of the right occipital lobe and right temporal lobe, unchanged from prior. 4. Stable 11 mm right parafalcine lesion, likely Kajol Crispen small meningioma. 5. Indeterminate though stable expansile lesion centered in the anterior glabellar process of the frontal bone. 6. No evidence of acute fracture or traumatic listhesis of the cervical spine. 7. Multilevel spondylitic changes throughout the cervical spine, as described. 8. Progressive spondylitic fusion across the C4-5 levels. 9. Scattered carious lesions and few apical lucencies of the maxillary and mandibular dentition. Correlate with  dental exam. 10. Cervical and intracranial atherosclerosis. Critical Value/emergent results were called by telephone at the time of interpretation on 08/12/2020 at 1:07 am to provider [facility] , who verbally acknowledged these results. Electronically Signed   By: Lovena Le M.D.   On:  08/12/2020 01:07   CT CERVICAL SPINE WO CONTRAST  Result Date: 08/12/2020 CLINICAL DATA:  Fall, left supraorbital laceration EXAM: CT HEAD WITHOUT CONTRAST CT MAXILLOFACIAL WITHOUT CONTRAST CT CERVICAL SPINE WITHOUT CONTRAST TECHNIQUE: Multidetector CT imaging of the head, cervical spine, and maxillofacial structures were performed using the standard protocol without intravenous contrast. Multiplanar CT image reconstructions of the cervical spine and maxillofacial structures were also generated. COMPARISON:  MR cervical spine 06/23/2010, CT head 01/31/2020, MR/MRA head 03/20/2015 FINDINGS: CT HEAD FINDINGS Brain: Hyperdense subarachnoid hemorrhage within the sulci of the left frontal lobe (3/16). Subdural collection over the left frontoparietal convexity measuring up to 4 mm in maximal thickness (3/14). Trace subdural hemorrhage layering along the left tentorium up to 3 mm in maximal thickness (6/36). No other concerning sites of hemorrhage. Cortical hyperattenuation along the mesial aspect of the right occipital lobe is present on comparison and may reflect sequela of prior vascular insult. This is in addition to Mayari Matus stable right temporal region of encephalomalacia with ex vacuo dilatation of the right temporal horn. Stable right para falcine lesion measuring 5 x 11 mm, likely Aloysuis Ribaudo small meningioma with small dural tails. Additional features of moderate diffuse parenchymal volume loss. Patchy areas of white matter hypoattenuation are most compatible with chronic microvascular angiopathy. No midline shift or transtentorial herniation. No CT evidence of large territory infarct. Basal cisterns are patent. Vascular: Atherosclerotic calcification of the carotid siphons and intradural vertebral arteries. No hyperdense vessel. Skull: Left frontal and supraorbital scalp swelling with laceration extending into the periorbital soft tissues, as below. No visible calvarial fracture. No other significant sites of scalp  contusion or hematoma. Other: None. CT MAXILLOFACIAL FINDINGS Osseous: The osseous structures appear diffusely demineralized which may limit detection of small or nondisplaced fractures. No fracture of the bony orbits. Nasal bones are intact. There is Anvika Gashi ill-defined, slightly expansile lesion measuring approximately 9 x 10 x31 mm (4/19, 10/39) centered in the anteroinferior glabellar process of the frontal bone. Comparison to prior MR and CT imaging reveals Jakhia Buxton similar appearance on prior studies. No other mid face fractures are seen. The pterygoid plates are intact. No visible or suspected temporal bone fractures. Temporomandibular joints are normally aligned. The mandible is intact. No fractured or avulsed teeth. Scattered carious lesions and few apical lucencies of the maxillary and mandibular dentition. Orbits: Left supraorbital/periorbital soft tissue swelling, with laceration and few punctate foci of soft tissue gas confined to the preseptal soft tissues. No retro septal gas, stranding or hemorrhage. The globes appear grossly normal and symmetric with prior bilateral lens extractions. Likely benign scleral calcification on the right. Symmetric appearance of the extraocular musculature and optic nerve sheath complexes. Normal caliber of the superior ophthalmic veins. Sinuses: Paranasal sinuses and mastoid air cells are predominantly clear. Middle ear cavities are clear. Debris in the bilateral external auditory canals. Ossicular chains are normally configured. Soft tissues: Left supraorbital/periorbital soft tissue swelling and laceration. No other significant soft tissue findings, gas or foreign body. CT CERVICAL SPINE FINDINGS Alignment: Stabilization collar absent. Mild rightward lateral flexion and cranial rotation. Straightening of the normal cervical lordosis with some focal reversal centered at the C4 level. Stepwise anterolisthesis C2-C4 is favored to be on Odell Choung spondylitic basis. Additional anterolisthesis  C7 on T1 also likely degenerative and not significantly changed comparison Skull base and vertebrae: The osseous structures appear diffusely demineralized which may limit detection of small or nondisplaced fractures. No acute skull base fracture. No vertebral body fracture or height loss. Progressive spondylitic fusion across the C4-5 levels. Multilevel spondylitic changes throughout the cervical spine as detailed below. Additional moderate to severe arthrosis at the atlantodental and basion dens intervals. No suspicious lytic or blastic lesions in the cervical spine. Soft tissues and spinal canal: No pre or paravertebral fluid or swelling. No visible canal hematoma. Disc levels: Multilevel intervertebral disc height loss with spondylitic endplate changes., larger disc osteophyte complexes and posterior osseous ridging is pronounced at C3-4, C4-5, C5-6 and C6-7 resulting in mild-to-moderate canal stenosis at these levels. Additional diffuse uncinate spurring and facet hypertrophic changes result in mild-to-moderate multilevel neural foraminal narrowing with more moderate to severe foraminal narrowing on the left at C3-4, C4-5. Upper chest: Biapical pleuroparenchymal scarring. Calcification of the proximal great vessels and cervical carotids, particular at the bifurcations. Normal thyroid. Other: None. IMPRESSION: 1. Hyperdense subarachnoid hemorrhage within the sulci of the left frontal lobe. Subdural hemorrhage over the left frontoparietal convexity measuring up to 4 mm in maximal thickness. Trace subdural hemorrhage layering along the left tentorium up to 3 mm in maximal thickness. 2. Left frontal and supraorbital scalp swelling and laceration extending. No visible calvarial or acute facial bone fracture. 3. Encephalomalacia of the right occipital lobe and right temporal lobe, unchanged from prior. 4. Stable 11 mm right parafalcine lesion, likely Elizabethann Lackey small meningioma. 5. Indeterminate though stable expansile lesion  centered in the anterior glabellar process of the frontal bone. 6. No evidence of acute fracture or traumatic listhesis of the cervical spine. 7. Multilevel spondylitic changes throughout the cervical spine, as described. 8. Progressive spondylitic fusion across the C4-5 levels. 9. Scattered carious lesions and few apical lucencies of the maxillary and mandibular dentition. Correlate with dental exam. 10. Cervical and intracranial atherosclerosis. Critical Value/emergent results were called by telephone at the time of interpretation on 08/12/2020 at 1:07 am to [facility] , who verbally acknowledged these results. Electronically Signed   By: Kreg ShropshirePrice  DeHay M.D.   On: 08/12/2020 01:07   CT ELBOW LEFT WO CONTRAST  Result Date: 08/15/2020 CLINICAL DATA:  Elbow pain and swelling after recent fall. Subdural hematoma. EXAM: CT OF THE UPPER LEFT EXTREMITY WITHOUT CONTRAST TECHNIQUE: Multidetector CT imaging of the left elbow was performed according to the standard protocol. COMPARISON:  Left elbow radiographs 08/13/2020 FINDINGS: Bones/Joint/Cartilage There is deformity of the radial head and neck which is greatest anteriorly and laterally, probably reflecting Zakkiyya Barno mildly displaced acute fracture. There is no depression of the articular surface. The radiocapitellar articulation is intact. The distal humerus and proximal ulna are intact. There is mild spurring of coronoid and olecranon processes. Small elbow joint effusion demonstrates intermediate density, likely reflecting hemarthrosis related to acute fracture. No erosive changes or bony destruction. Ligaments Suboptimally assessed by CT. Muscles and Tendons The biceps and triceps tendons appear intact. No focal muscular abnormalities are identified. Soft tissues Dorsal soft tissue swelling without evidence of focal fluid collection, soft tissue emphysema or foreign body. No skin ulceration identified. IMPRESSION: 1. Mildly displaced fracture of the radial head  and neck, likely acute. No depression of the articular surface. 2. Associated small left elbow joint hemarthrosis. 3. Dorsal soft tissue swelling without evidence of focal fluid collection, soft tissue emphysema or foreign body. Electronically Signed   By: Chrissie NoaWilliam  Lin Landsman M.D.   On: 08/15/2020 14:22   DG Shoulder Left  Result Date: 08/12/2020 CLINICAL DATA:  Arm pain after fall. EXAM: LEFT SHOULDER - 2+ VIEW COMPARISON:  Left humerus 08/11/2020. FINDINGS: Acromioclavicular glenohumeral degenerative change. Subacromial spurring. No acute bony or joint abnormality. No evidence of fracture dislocation. IMPRESSION: Acromioclavicular glenohumeral degenerative change. Subacromial spurring. No acute abnormality identified. Electronically Signed   By: Marcello Moores  Register   On: 08/12/2020 05:58   DG Humerus Left  Result Date: 08/11/2020 CLINICAL DATA:  Left arm pain after fall EXAM: LEFT HUMERUS - 2+ VIEW COMPARISON:  06/16/2010 FINDINGS: Frontal and lateral views of the left humerus are obtained. There are no acute fractures. Moderate osteoarthritis of the left elbow. Soft tissues are unremarkable. IMPRESSION: 1. No acute displaced fracture. 2. Osteoarthritis of the left elbow. Electronically Signed   By: Randa Ngo M.D.   On: 08/11/2020 23:19   ECHOCARDIOGRAM COMPLETE  Result Date: 08/12/2020    ECHOCARDIOGRAM REPORT   Patient Name:   KYIA BRANIFF Campau Date of Exam: 08/12/2020 Medical Rec #:  [id number]       Height:       60.0 in Accession #:    [id number]      Weight:       128.0 lb Date of Birth:  [birth year]       BSA:          1.544 m Patient Age:    87 years       BP:           120/57 mmHg Patient Gender: F               HR:           44 bpm. Exam Location:  Inpatient Procedure: 2D Echo, Cardiac Doppler and Color Doppler Indications:    Syncope 780.2 / R55  History:        Patient has prior history of Echocardiogram examinations, most                 recent 03/26/2015. Stroke, Arrythmias:Atrial  Fibrillation; Risk                 Factors:Hypertension and Dyslipidemia.  Sonographer:    Agricultural engineer Referring Phys: [id number] [place]  1. Left ventricular ejection fraction, by estimation, is 55 to 60%. The left ventricle has normal function. The left ventricle has no regional wall motion abnormalities. Left ventricular diastolic function could not be evaluated.  2. Right ventricular systolic function is normal. The right ventricular size is normal. There is normal pulmonary artery systolic pressure. The estimated right ventricular systolic pressure is [id number] mmHg.  3. Left atrial size was severely dilated.  4. Right atrial size was moderately dilated.  5. The mitral valve is abnormal. Mild mitral valve regurgitation.  6. The aortic valve is tricuspid. Aortic valve regurgitation is not visualized. Mild aortic valve sclerosis is present, with no evidence of aortic valve stenosis.  7. The inferior vena cava is normal in size with greater than 50% respiratory variability, suggesting right atrial pressure of 3 mmHg.  8. Cannot exclude small PFO. FINDINGS  Left Ventricle: Left ventricular ejection fraction, by estimation, is 55 to 60%. The left ventricle has normal function. The left ventricle has no regional wall motion abnormalities. The left ventricular internal cavity size was normal in size. There is  no left ventricular hypertrophy. Left ventricular diastolic function could not be evaluated due to atrial fibrillation. Left ventricular diastolic  function could not be evaluated. Right Ventricle: The right ventricular size is normal. No increase in right ventricular wall thickness. Right ventricular systolic function is normal. There is normal pulmonary artery systolic pressure. The tricuspid regurgitant velocity is 2.57 m/s, and  with an assumed right atrial pressure of 3 mmHg, the estimated right ventricular systolic pressure is 29.4 mmHg. Left Atrium: Left atrial size was  severely dilated. Right Atrium: Right atrial size was moderately dilated. Pericardium: There is no evidence of pericardial effusion. Mitral Valve: The mitral valve is abnormal. There is mild thickening of the mitral valve leaflet(s). Mild to moderate mitral annular calcification. Mild mitral valve regurgitation. Tricuspid Valve: The tricuspid valve is grossly normal. Tricuspid valve regurgitation is mild. Aortic Valve: The aortic valve is tricuspid. Aortic valve regurgitation is not visualized. Mild aortic valve sclerosis is present, with no evidence of aortic valve stenosis. Pulmonic Valve: The pulmonic valve was normal in structure. Pulmonic valve regurgitation is not visualized. Aorta: The aortic root and ascending aorta are structurally normal, with no evidence of dilitation. Venous: The inferior vena cava is normal in size with greater than 50% respiratory variability, suggesting right atrial pressure of 3 mmHg. IAS/Shunts: Cannot exclude small PFO.  LEFT VENTRICLE PLAX 2D LVIDd:         3.50 cm LVIDs:         2.50 cm LV PW:         0.90 cm LV IVS:        0.90 cm LVOT diam:     1.80 cm LV SV:         51 LV SV Index:   33 LVOT Area:     2.54 cm  LV Volumes (MOD) LV vol d, MOD A2C: 68.5 ml LV vol d, MOD A4C: 65.5 ml LV vol s, MOD A2C: 26.9 ml LV vol s, MOD A4C: 27.7 ml LV SV MOD A2C:     41.6 ml LV SV MOD A4C:     65.5 ml LV SV MOD BP:      41.6 ml RIGHT VENTRICLE TAPSE (M-mode): 1.8 cm LEFT ATRIUM             Index       RIGHT ATRIUM           Index LA diam:        4.80 cm 3.11 cm/m  RA Area:     19.80 cm LA Vol (A2C):   74.6 ml 48.31 ml/m RA Volume:   60.10 ml  38.92 ml/m LA Vol (A4C):   85.1 ml 55.11 ml/m LA Biplane Vol: 82.4 ml 53.36 ml/m  AORTIC VALVE LVOT Vmax:   70.90 cm/s LVOT Vmean:  53.200 cm/s LVOT VTI:    0.201 m  AORTA Ao Root diam: 3.50 cm TRICUSPID VALVE TR Peak grad:   26.4 mmHg TR Vmax:        257.00 cm/s  SHUNTS Systemic VTI:  0.20 m Systemic Diam: 1.80 cm Zoila Shutter MD Electronically  signed by Zoila Shutter MD Signature Date/Time: 08/12/2020/11:44:13 AM    Final    Intravitreal Injection, Pharmacologic Agent - OS - Left Eye  Result Date: 08/01/2020 Time Out 08/01/2020. 3:30 PM. Confirmed correct patient, procedure, site, and patient consented. Anesthesia Topical anesthesia was used. Anesthetic medications included Akten 3.5%. Procedure Preparation included Tobramycin 0.3%, 10% betadine to eyelids, 5% betadine to ocular surface. Morrie Daywalt supplied needle was used. Injection: 2.5 mg Bevacizumab (AVASTIN) 2.5mg /0.29mL SOSY   NDC: 96789-381-01, Lot: [id number]   Route:  Intravitreal, Site: Left Eye Post-op Post injection exam found visual acuity of at least counting fingers. The patient tolerated the procedure well. There were no complications. The patient received written and verbal post procedure care education. Post injection medications were not given.   OCT, Retina - OU - Both Eyes  Result Date: 08/01/2020 Right Eye Quality was good. Scan locations included subfoveal. Central Foveal Thickness: 248. Findings include normal observations, retinal drusen , no SRF, no IRF. Left Eye Quality was good. Scan locations included subfoveal. Central Foveal Thickness: 328. Findings include pigment epithelial detachment. Notes Much less CME much less intraretinal fluid left eye status post recent injection intravitreal Avastin some 5 weeks previous  CT MAXILLOFACIAL WO CONTRAST  Result Date: 08/12/2020 CLINICAL DATA:  Fall, left supraorbital laceration EXAM: CT HEAD WITHOUT CONTRAST CT MAXILLOFACIAL WITHOUT CONTRAST CT CERVICAL SPINE WITHOUT CONTRAST TECHNIQUE: Multidetector CT imaging of the head, cervical spine, and maxillofacial structures were performed using the standard protocol without intravenous contrast. Multiplanar CT image reconstructions of the cervical spine and maxillofacial structures were also generated. COMPARISON:  MR cervical spine 06/23/2010, CT head 01/31/2020, MR/MRA head 03/20/2015  FINDINGS: CT HEAD FINDINGS Brain: Hyperdense subarachnoid hemorrhage within the sulci of the left frontal lobe (3/16). Subdural collection over the left frontoparietal convexity measuring up to 4 mm in maximal thickness (3/14). Trace subdural hemorrhage layering along the left tentorium up to 3 mm in maximal thickness (6/36). No other concerning sites of hemorrhage. Cortical hyperattenuation along the mesial aspect of the right occipital lobe is present on comparison and may reflect sequela of prior vascular insult. This is in addition to Jaymond Waage stable right temporal region of encephalomalacia with ex vacuo dilatation of the right temporal horn. Stable right para falcine lesion measuring 5 x 11 mm, likely Kaysie Michelini small meningioma with small dural tails. Additional features of moderate diffuse parenchymal volume loss. Patchy areas of white matter hypoattenuation are most compatible with chronic microvascular angiopathy. No midline shift or transtentorial herniation. No CT evidence of large territory infarct. Basal cisterns are patent. Vascular: Atherosclerotic calcification of the carotid siphons and intradural vertebral arteries. No hyperdense vessel. Skull: Left frontal and supraorbital scalp swelling with laceration extending into the periorbital soft tissues, as below. No visible calvarial fracture. No other significant sites of scalp contusion or hematoma. Other: None. CT MAXILLOFACIAL FINDINGS Osseous: The osseous structures appear diffusely demineralized which may limit detection of small or nondisplaced fractures. No fracture of the bony orbits. Nasal bones are intact. There is Jonella Redditt ill-defined, slightly expansile lesion measuring approximately 9 x 10 x31 mm (4/19, 10/39) centered in the anteroinferior glabellar process of the frontal bone. Comparison to prior MR and CT imaging reveals Kainalu Heggs similar appearance on prior studies. No other mid face fractures are seen. The pterygoid plates are intact. No visible or suspected  temporal bone fractures. Temporomandibular joints are normally aligned. The mandible is intact. No fractured or avulsed teeth. Scattered carious lesions and few apical lucencies of the maxillary and mandibular dentition. Orbits: Left supraorbital/periorbital soft tissue swelling, with laceration and few punctate foci of soft tissue gas confined to the preseptal soft tissues. No retro septal gas, stranding or hemorrhage. The globes appear grossly normal and symmetric with prior bilateral lens extractions. Likely benign scleral calcification on the right. Symmetric appearance of the extraocular musculature and optic nerve sheath complexes. Normal caliber of the superior ophthalmic veins. Sinuses: Paranasal sinuses and mastoid air cells are predominantly clear. Middle ear cavities are clear. Debris in the bilateral external auditory canals.  Ossicular chains are normally configured. Soft tissues: Left supraorbital/periorbital soft tissue swelling and laceration. No other significant soft tissue findings, gas or foreign body. CT CERVICAL SPINE FINDINGS Alignment: Stabilization collar absent. Mild rightward lateral flexion and cranial rotation. Straightening of the normal cervical lordosis with some focal reversal centered at the C4 level. Stepwise anterolisthesis C2-C4 is favored to be on Lanitra Battaglini spondylitic basis. Additional anterolisthesis C7 on T1 also likely degenerative and not significantly changed comparison Skull base and vertebrae: The osseous structures appear diffusely demineralized which may limit detection of small or nondisplaced fractures. No acute skull base fracture. No vertebral body fracture or height loss. Progressive spondylitic fusion across the C4-5 levels. Multilevel spondylitic changes throughout the cervical spine as detailed below. Additional moderate to severe arthrosis at the atlantodental and basion dens intervals. No suspicious lytic or blastic lesions in the cervical spine. Soft tissues and  spinal canal: No pre or paravertebral fluid or swelling. No visible canal hematoma. Disc levels: Multilevel intervertebral disc height loss with spondylitic endplate changes., larger disc osteophyte complexes and posterior osseous ridging is pronounced at C3-4, C4-5, C5-6 and C6-7 resulting in mild-to-moderate canal stenosis at these levels. Additional diffuse uncinate spurring and facet hypertrophic changes result in mild-to-moderate multilevel neural foraminal narrowing with more moderate to severe foraminal narrowing on the left at C3-4, C4-5. Upper chest: Biapical pleuroparenchymal scarring. Calcification of the proximal great vessels and cervical carotids, particular at the bifurcations. Normal thyroid. Other: None. IMPRESSION: 1. Hyperdense subarachnoid hemorrhage within the sulci of the left frontal lobe. Subdural hemorrhage over the left frontoparietal convexity measuring up to 4 mm in maximal thickness. Trace subdural hemorrhage layering along the left tentorium up to 3 mm in maximal thickness. 2. Left frontal and supraorbital scalp swelling and laceration extending. No visible calvarial or acute facial bone fracture. 3. Encephalomalacia of the right occipital lobe and right temporal lobe, unchanged from prior. 4. Stable 11 mm right parafalcine lesion, likely Nyle Limb small meningioma. 5. Indeterminate though stable expansile lesion centered in the anterior glabellar process of the frontal bone. 6. No evidence of acute fracture or traumatic listhesis of the cervical spine. 7. Multilevel spondylitic changes throughout the cervical spine, as described. 8. Progressive spondylitic fusion across the C4-5 levels. 9. Scattered carious lesions and few apical lucencies of the maxillary and mandibular dentition. Correlate with dental exam. 10. Cervical and intracranial atherosclerosis. Critical Value/emergent results were called by telephone at the time of interpretation on 08/12/2020 at 1:07 am to provider [facility] ,  who verbally acknowledged these results. Electronically Signed   By: Kreg ShropshirePrice  DeHay M.D.   On: 08/12/2020 01:07   US Abdomen Limited RUQ (LIVER/GB)  Result Date: 08/23/2020 CLINICAL DATA:  Increased LFTs. EXAM: ULTRASOUND ABDOMEN LIMITED RIGHT UPPER QUADRANT COMPARISON:  None FINDINGS: Gallbladder: Limited visualization of the gallbladder due to difficulty in patient positioning due to altered mental status and adjacent bowel gas. Cholelithiasis, 8 mm calculus without wall thickening or pericholecystic fluid. No tenderness over the gallbladder reported by the sonographer. Common bile duct: Diameter: 1.8 mm Liver: Coarsened hepatic echotexture is suggested with very limited visualization of much of the RIGHT hepatic lobe. No focal lesion on submitted images. Mildly nodular contour. Visualized portal vein is patent with hepatopetal flow though with limited assessment. Other: None. IMPRESSION: Limited evaluation with findings that suggest background liver disease. If there is further concern for hepatic or biliary abnormality further assessment with cross-sectional imaging either with CT or MR could be considered as warranted. Electronically Signed  By: Zetta Bills M.D.   On: 08/23/2020 15:11    Microbiology: No results found for this or any previous visit (from the past 240 hour(s)).   Labs: Basic Metabolic Panel: Recent Labs  Lab 08/22/20 0119 08/23/20 0019 08/24/20 0214 08/25/20 0124 08/26/20 0150  NA 138 137 137 140 141  K 3.8 3.7 3.6 3.8 3.8  CL 104 102 101 103 104  CO2 24 23 24 25 25   GLUCOSE 119* 139* 133* 127* 111*  BUN 23 25* 17 20 24*  CREATININE 1.00 1.02* 0.78 0.90 0.98  CALCIUM 8.2* 7.9* 8.3* 8.2* 8.1*  MG 2.1 1.9 2.0 2.1 2.1  PHOS 3.3 3.0 3.2 3.5 3.1   Liver Function Tests: Recent Labs  Lab 08/22/20 0119 08/23/20 0019 08/24/20 0214 08/25/20 0124 08/26/20 0150  AST 165* 477* 255* 385* 193*  ALT 252* 501* 400* 526* 366*  ALKPHOS 132* 151* 146* 174* 146*  BILITOT  1.2 0.8 1.2 1.5* 0.6  PROT 5.8* 5.7* 5.9* 5.6* 5.4*  ALBUMIN 2.3* 2.3* 2.3* 2.2* 2.0*   No results for input(s): LIPASE, AMYLASE in the last 168 hours. No results for input(s): AMMONIA in the last 168 hours. CBC: Recent Labs  Lab 08/22/20 0119 08/23/20 0019 08/24/20 0214 08/25/20 0124 08/26/20 0150  WBC 11.2* 10.2 12.6* 11.4* 11.3*  NEUTROABS 8.3* 7.8* 10.0* 8.7* 8.3*  HGB 10.5* 10.7* 10.5* 11.0* 10.1*  HCT 29.9* 31.3* 32.2* 32.3* 30.9*  MCV 84.5 86.9 85.9 86.4 86.8  PLT 253 275 281 290 301   Cardiac Enzymes: No results for input(s): CKTOTAL, CKMB, CKMBINDEX, TROPONINI in the last 168 hours. BNP: BNP (last 3 results) No results for input(s): BNP in the last 8760 hours.  ProBNP (last 3 results) No results for input(s): PROBNP in the last 8760 hours.  CBG: No results for input(s): GLUCAP in the last 168 hours.     Signed:  Fayrene Helper MD.  [facility] 08/26/2020, 10:04 AM

## 2020-08-26 NOTE — Progress Notes (Signed)
Inpatient Rehabilitation-Admissions Coordinator   Pt notified of insurance approval and bed offer and has accepted. Per her request notified her daughter to review consent forms and insurance benefit letter. All questions answered. Received medical clearance for admit to CIR today from Dr. Lowell Guitar.   RN and [facility] team notified of plan to admit today.   Cheri Rous, OTR/L  Rehab Admissions Coordinator  [phone] 08/26/2020 11:46 AM

## 2020-08-26 NOTE — H&P (Signed)
Physical Medicine and Rehabilitation Admission H&P       HPI: [name] is a 87 year old right-handed female with history of hypertension macular degeneration right temporal ICH secondary to hypertensive crisis 2016, amyloid angiopathy, CKD stage III, PAF.  Patient with recent left frontal lobe SAH left frontal parietal SDH after a fall with left wrist radial head fracture 08/11/2020 with recommendations by orthopedic services for sling and outpatient follow-up with Dr. Lorin Mercy and currently nonweightbearing.  She was discharged home 08/16/2020 from [facility] although skilled nursing facility was recommended but readmitted 08/17/2020 with ongoing confusion with lethargy and inability to care for herself.  Per chart review patient currently lives with spouse and daughter.  1 level home with 15 steps down to basement level apartment with chairlift.  They have a caregiver that comes in every morning but the daughter is adding more care in the home as needed.  They have a life alert and multiple safety features in the house.  Patient was using rolling walker at baseline prior to SDH.  Follow-up cranial CT scan showed increase in left parietal SAH and slight 2 mm rightward midline shift.  Lethargy felt to be multifactorial due to tramadol as well as acute on chronic renal failure.  Admission chemistries glucose 126 BUN 33 creatinine 1.18, hemoglobin 11.6 WBC 12,500, TSH 2.786, total CK 36, urinalysis negative nitrite.  Patient was placed on IV fluids for hydration with latest creatinine 0.82.  Neurosurgery consulted advised conservative care of traumatic SAH/SDH.  Blood pressure monitored.  ARB on hold due to renal insufficiency.  Findings of elevated LFTs Lipitor was held and continued to improve.  She is tolerating a regular diet.  Close monitoring pain control currently with Lidoderm patch tramadol has been discontinued.  Due to patient decrease in functional ability altered mental status  was admitted for a comprehensive rehab program.   Review of Systems  Constitutional: Negative for chills and fever.  HENT: Negative for hearing loss.   Eyes: Negative for blurred vision and double vision.       Macular degeneration  Respiratory: Negative for cough and shortness of breath.   Cardiovascular: Negative for chest pain, palpitations and leg swelling.  Gastrointestinal: Positive for constipation. Negative for heartburn, nausea and vomiting.  Genitourinary: Negative for dysuria, flank pain and hematuria.  Musculoskeletal: Positive for falls and myalgias.  Skin: Negative for rash.  Neurological: Positive for weakness.  All other systems reviewed and are negative.       Past Medical History:  Diagnosis Date  . Hypertension    . Macular degeneration of left eye    . Osteopenia    . Stroke [facility])           Past Surgical History:  Procedure Laterality Date  . CATARACT EXTRACTION             Family History  Problem Relation Age of Onset  . Cancer Daughter          breast    Social History:  reports that she has never smoked. She has never used smokeless tobacco. She reports current alcohol use of about 1.0 standard drink of alcohol per week. She reports that she does not use drugs. Allergies:       Allergies  Allergen Reactions  . Ace Inhibitors        REACTION: angioedema  . Other        Blood Thinners Cause Brain Bleeds  Medications Prior to Admission  Medication Sig Dispense Refill  . acetaminophen (TYLENOL) 500 MG tablet Take 1-2 tablets (500-1,000 mg total) by mouth every 6 (six) hours as needed for mild pain or moderate pain. 30 tablet 0  . amLODipine (NORVASC) 5 MG tablet TAKE 1 TABLET BY MOUTH  DAILY 90 tablet 3  . atorvastatin (LIPITOR) 40 MG tablet TAKE 1 TABLET BY MOUTH AT  BEDTIME 90 tablet 3  . losartan (COZAAR) 100 MG tablet TAKE 1 TABLET BY MOUTH  DAILY (Patient taking differently: Take 100 mg by mouth at bedtime.) 90 tablet 3  . Multiple  Vitamin (MULTIVITAMIN) tablet Take 1 tablet by mouth daily.      . traMADol (ULTRAM) 50 MG tablet Take 1 tablet (50 mg total) by mouth every 12 (twelve) hours as needed for severe pain. (Patient not taking: No sig reported) 6 tablet 0      Drug Regimen Review Drug regimen was reviewed and remains appropriate with no significant issues identified     Home: Home Living Family/patient expects to be discharged to:: Private residence Living Arrangements: Spouse/significant other,Children Available Help at Discharge: Family,Personal care attendant Type of Home: House Home Access: Stairs to enter [employer] of Steps: 15 stairs down to basement level apartment with chair lift Entrance Stairs-Rails: Left Home Layout: One level Bathroom Toilet: Handicapped height Bathroom Accessibility: Yes Home Equipment: Grab bars - tub/shower,Shower seat - built in,Grab bars - toilet,Walker - 2 wheels,Wheelchair - manual,Other (comment),Hand held shower head,Walker - 4 wheels Additional Comments: This info pulled from recent stay at [facility]. Pt and and spouse live in a basement apartment in her daughter and son-in-law's house. Everything is very handicap accessible due to her husband also in a WC. They enter into their daughter's house and then do have to go down 16 steps to their apartment, but there is a chair lift. Pt uses the steps becuase she likes the exercise. They have a caregiver that comes every morning, but the dtr stated they are adding more help and increase time to help them both if needed. They have life alert and multiple safety features. Pt used RW at baseline in the apratment but really liked to stay active and on her feet. Still does her banking, etc.   Functional History: Prior Function Level of Independence: Independent with assistive device(s) Comments: Pt ambualtes with a RW at home, and reports being independent with all ADLs prior to recent fall   Functional Status:   Mobility: Bed Mobility Overal bed mobility: Needs Assistance Bed Mobility: Sit to Supine Supine to sit: Mod assist,HOB elevated,+2 for physical assistance (due to pt's pain and anxiety) General bed mobility comments: received sitting in recliner Transfers Overall transfer level: Needs assistance Equipment used: 2 person hand held assist Transfers: Sit to/from Stand Sit to Stand: Mod assist,+2 safety/equipment Stand pivot transfers: Mod assist,+2 safety/equipment General transfer comment: sit to stand x 2 with modA+2 for initial trial and maxA+1 for second trial Ambulation/Gait Ambulation/Gait assistance: Min assist Gait Distance (Feet): 5 Feet Assistive device: 2 person hand held assist (face to face, chair follow and physical assist provided by +2) Gait Pattern/deviations: Step-to pattern,Decreased step length - left,Decreased stride length,Decreased weight shift to left,Trunk flexed,Wide base of support General Gait Details: patient anxious to ambulate but with encouragement and visual target, patient completed 5' ambulation with HHAx2 and minA   ADL: ADL Overall ADL's : Needs assistance/impaired Eating/Feeding: Set up,Sitting Eating/Feeding Details (indicate cue type and reason): Setup to open containers, prepare coffee  Grooming: Sitting,Minimal assistance Grooming Details (indicate cue type and reason): will be MIn A for bimanual tasks. able to wash face with setup, don glassess with one hand. assistance needed to clean glasses Upper Body Bathing: Maximal assistance,Sitting Lower Body Bathing: Maximal assistance,Sit to/from stand,Sitting/lateral leans Upper Body Dressing : Maximal assistance,Sitting Lower Body Dressing: Total assistance,Sit to/from stand Toileting- Water quality scientist and Hygiene: Total assistance,Sit to/from stand,Sitting/lateral lean General ADL Comments: Pt limited by pain, anxiety and decreased standing balance   Cognition: Cognition Overall Cognitive  Status: Impaired/Different from baseline Orientation Level: Oriented to person,Oriented to place,Oriented to situation,Disoriented to time Cognition Arousal/Alertness: Awake/alert Behavior During Therapy: Anxious Overall Cognitive Status: Impaired/Different from baseline Area of Impairment: Following commands,Awareness,Memory,Attention Orientation Level: Place,Time Current Attention Level: Selective Memory: Decreased short-term memory Following Commands: Follows one step commands with increased time Awareness: Emergent General Comments: Patient with increasing anxiety which hinders memory/attention to tasks   Physical Exam: Blood pressure (!) 141/63, pulse 64, temperature 98.1 F (36.7 C), temperature source Oral, resp. rate 16, SpO2 97 %. Physical Exam Constitutional:      General: She is not in acute distress. HENT:     Right Ear: External ear normal.     Left Ear: External ear normal.     Mouth/Throat:     Mouth: Mucous membranes are moist.  Eyes:     Pupils: Pupils are equal, round, and reactive to light.  Cardiovascular:     Rate and Rhythm: Normal rate and regular rhythm.  Pulmonary:     Effort: Pulmonary effort is normal.     Breath sounds: Normal breath sounds.  Musculoskeletal:     Cervical back: Normal range of motion.     Comments: LUE tender, in sling. Both calves sl TTP  Skin:    General: Skin is warm.  Neurological:     Comments: Patient is alert pleasant no acute distress makes eye contact with examiner.  No focal CN abnl. Provides her name and age and follows simple commands. Fair to borderline insight and awareness. Moves all 4 with limitations in LUE d/t ortho. Sensation intact       Lab Results Last 48 Hours        Results for orders placed or performed during the hospital encounter of 08/17/20 (from the past 48 hour(s))  Basic metabolic panel     Status: Abnormal    Collection Time: 08/21/20  1:32 AM  Result Value Ref Range    Sodium 139 135 - 145  mmol/L    Potassium 3.7 3.5 - 5.1 mmol/L    Chloride 106 98 - 111 mmol/L    CO2 23 22 - 32 mmol/L    Glucose, Bld 118 (H) 70 - 99 mg/dL      Comment: Glucose reference range applies only to samples taken after fasting for at least 8 hours.    BUN 17 8 - 23 mg/dL    Creatinine, Ser 0.82 0.44 - 1.00 mg/dL    Calcium 8.2 (L) 8.9 - 10.3 mg/dL    GFR, Estimated >60 >60 mL/min      Comment: (NOTE) Calculated using the CKD-EPI Creatinine Equation (2021)      Anion gap 10 5 - 15      Comment: Performed at [facility] [address] [place], [place] [address]      Imaging Results (Last 48 hours)  No results found.           Medical Problem List and Plan: 1.  Decreased  functional build altered mental status secondary to traumatic SDH/SAH/acute metabolic encephalopathy             -patient may  shower             -ELOS/Goals: mod I to supervision PT, supervision OT and SLP 2.  Antithrombotics: -DVT/anticoagulation: SCDs, dopplers pending today (has had calf pain)             -antiplatelet therapy: N/A 3. Pain Management: Lidoderm patch,Voltaren gel QID 4. Mood: Provide emotional support             -antipsychotic agents: N/A 5. Neuropsych: This patient is capable of making decisions on her own behalf. 6. Skin/Wound Care: Routine skin checks 7. Fluids/Electrolytes/Nutrition: Routine in and outs with follow-up chemistries             -encourage appropriate PO 8.  Left mildly displaced fracture of the radial head and neck.              -Currently with shoulder sling              -NWB with conservative care.               -Plan was to follow Dr. Ophelia Charter as an outpatient.  Will contact orthopedic service in regards to weightbearing status 9.  Hyperlipidemia.  Lipitor held due to elevated LFTs              10.  Elevated LFTs.  Follow-up renal function/hepatic panel.             -recent RUQ [place] suggestive of background liver disease             -LFT's improving 11. HTN: BP's  borderline/stable. ARB held d/t AKI 12. AKI: ARB on hold, has received IVF. Cr improving           Lynnae Prude 08/21/2020  I have personally reviewed laboratory data, imaging studies, as well as relevant notes and concur with the physician assistant's documentation above.  The patient's status has not changed from the original H&P.  Any changes in documentation from the acute care chart have been noted above.  Ranelle Oyster, MD, [place] [facility]

## 2020-08-26 NOTE — IPOC Note (Signed)
Individualized overall Plan of Care (IPOC) Patient Details Name: [name] MRN: [id number] DOB: 05-Jun-1918  Admitting Diagnosis: Traumatic subdural hematoma [facility])  Hospital Problems: Principal Problem:   Traumatic subdural hematoma ([place]) Active Problems:   Hypoalbuminemia due to protein-calorie malnutrition ([place])   Essential hypertension   Dyslipidemia   Transaminitis   Closed displaced fracture of head of right radius     Functional Problem List: Nursing Behavior,Bladder,Bowel,Endurance,Medication Management,Motor,Pain,Safety,Perception,Sensory  PT Balance,Motor,Endurance,Behavior,Pain,Perception,Sensory,Safety,Skin Integrity  OT Balance,Cognition,Endurance,Perception,Safety  SLP Cognition,Linguistic  TR         Basic ADL's: OT Eating,Grooming,Bathing,Dressing,Toileting     Advanced  ADL's: OT       Transfers: PT Bed Mobility,Bed to Chair,Car  OT Toilet,Tub/Shower     Locomotion: PT Ambulation,Stairs     Additional Impairments: OT Other (comment) (limited use of left UE due to fracture)  SLP Communication,Social Cognition expression Problem Solving,Memory,Awareness  TR      Anticipated Outcomes Item Anticipated Outcome  Self Feeding set up  Swallowing      Basic self-care  min A  Toileting  min a   Bathroom Transfers CG/min A  Bowel/Bladder  To be continent of bowel/bladder with mod I  Transfers  supervision with LRAD  Locomotion  supervision with LRAD  Communication  Supervision  Cognition  Supervision  Pain  <2  Safety/Judgment  Able to call for help and express needs   Therapy Plan: PT Intensity: Minimum of 1-2 x/day ,45 to 90 minutes PT Frequency: 5 out of 7 days PT Duration Estimated Length of Stay: 2 weeks OT Intensity: Minimum of 1-2 x/day, 45 to 90 minutes OT Frequency: 5 out of 7 days OT Duration/Estimated Length of Stay: 2 weeks SLP Intensity: Minumum of 1-2 x/day, 30 to 90 minutes SLP Frequency: 3 to 5 out of 7  days SLP Duration/Estimated Length of Stay: 2 weeks    Team Interventions: Nursing Interventions Patient/Family Education,Pain Management,Bladder Management,Medication Management,Bowel Management,Disease Management/Prevention,Cognitive Remediation/Compensation,Discharge Planning  PT interventions Ambulation/gait training,Balance/vestibular training,Cognitive remediation/compensation,Community reintegration,Discharge planning,Disease Printmaker instruction,Functional mobility training,Neuromuscular re-education,Pain management,Patient/family education,Psychosocial support,Skin care/wound management,Splinting/orthotics,Therapeutic Activities,Stair training,Therapeutic Exercise,UE/LE Strength taining/ROM,UE/LE Coordination activities,Visual/perceptual remediation/compensation,Wheelchair propulsion/positioning  OT Interventions Balance/vestibular training,Cognitive remediation/compensation,Discharge planning,DME/adaptive equipment instruction,Functional mobility training,Patient/family education,Self Care/advanced ADL retraining,Splinting/orthotics,Therapeutic Activities,Therapeutic Exercise  SLP Interventions Cognitive remediation/compensation,Cueing hierarchy,Functional tasks,Patient/family education,Speech/Language facilitation,Therapeutic Activities  TR Interventions    SW/CM Interventions Discharge Planning,Psychosocial Support,Patient/Family Education   Barriers to Discharge MD  Medical stability and Wound care  Nursing      PT Decreased caregiver support,Home environment access/layout,Lack of/limited family support,Insurance for SNF coverage,Weight bearing restrictions    OT      SLP      SW Other (comments) Extremely HOH   Team Discharge Planning: Destination: PT-Home ,OT- Home , SLP-Home Projected Follow-up: PT-Home health PT,24 hour supervision/assistance, OT-  Home health OT, SLP-24 hour supervision/assistance,Home Health SLP Projected Equipment  Needs: PT-To be determined, OT- To be determined, SLP-None recommended by SLP Equipment Details: PT- , OT-  Patient/family involved in discharge planning: PT- Patient,  OT-Patient, SLP-Patient unable/family or caregive not available  MD ELOS: 12-16 days. Medical Rehab Prognosis:  Good Assessment: 87 year old right-handed female with history of hypertension macular degeneration right temporal ICH secondary to hypertensive crisis 2016, amyloid angiopathy, CKD stage III, PAF.  Patient with recent left frontal lobe SAH left frontal parietal SDH after a fall with left wrist radial head fracture 08/11/2020 with recommendations by orthopedic services for sling and outpatient follow-up with Dr. Lorin Mercy and currently nonweightbearing.  She was discharged home 08/16/2020 from [facility] although skilled nursing facility was recommended but readmitted 08/17/2020 with ongoing confusion with lethargy and inability to care for herself.  They have a caregiver that comes in every morning but the daughter is adding more care in the home as needed.  They have a life alert and multiple safety features in the house.  Patient was using rolling walker at baseline prior to SDH.  Follow-up cranial CT scan showed increase in left parietal SAH and slight 2 mm rightward midline shift.  Lethargy felt to be multifactorial due to tramadol as well as acute on chronic renal failure.  Admission chemistries glucose 126 BUN 33 creatinine 1.18, hemoglobin 11.6 WBC 12,500, TSH 2.786, total CK 36, urinalysis negative nitrite.  Patient was placed on IV fluids for hydration with latest creatinine 0.82.  Neurosurgery consulted advised conservative care of traumatic SAH/SDH.  Blood pressure monitored.  ARB on hold due to renal insufficiency.  Findings of elevated LFTs Lipitor was held and continued to improve.  She is tolerating a regular diet.  Close monitoring pain control currently with Lidoderm patch tramadol has been discontinued.   Patient with resulting functional deficits with mobilit,y transfers, self-care, cognition.  Will set goals for Supervision with PT/SLP and Min A with OT.   Due to the current state of emergency, patients may not be receiving their 3-hours of Medicare-mandated therapy.  See Team Conference Notes for weekly updates to the plan of care

## 2020-08-26 NOTE — Progress Notes (Signed)
Lower extremity venous has been completed.   Preliminary results in CV Proc.   Blanch Media 08/26/2020 11:31 AM

## 2020-08-26 NOTE — Progress Notes (Signed)
Carolyn Chin, MD  Physician  Physical Medicine and Rehabilitation  Consult Note     Signed  Date of Service:  08/19/2020  7:53 AM      Related encounter: Admission (Discharged) from 08/17/2020 in [facility]  SURGICAL       Signed      Expand All Collapse All     Show:Clear all [x] Manual[x] Template[] Copied  Added by: [x] Love, , PA-C[x] Raulkar, , MD   [] Hover for details           Physical Medicine and Rehabilitation Consult   Reason for Consult: Functional deficits due to TBI.  Referring Physician: Dr. Evlyn Kanner     HPI: [name] is a 87 y.o. female with history of HTN, macular degeneration, right temporal ICH, amyloid angiopathy, PAF who was recently admitted with left frontal lobe SAH/left frontoparietal SDH and fall with left wrist/radial head fracture.  She was discharged to home on 12/24 but readmitted on 12/25 with ongoing confusion with lethargy and inability to care for self.   Follow-up CT head showed increase in left parietal SAH and slight 2 mm rightward midline shift. Lethargy felt to be multifactorial due to tramadol and acute on chronic renal failure treated with IVF with improvement in encephalopathy.  LUE continues in sling with NWB on left wrist. Therapy evaluations completed revealing deficits due to weakness, anxiety with fear of falling affecting mobility. Physical Medicine & Rehabilitation was consulted to assess candidacy for CIR given impaired mobility and ADLs.   Per reports: patient and husband (WC bound- has caregiver for few hours) live with family in basement apartment. Patient was independent with RW PTA. She has been to CIR before with excellent results.    Review of Systems  Unable to perform ROS: Mental acuity          Past Medical History:  Diagnosis Date  . Hypertension    . Macular degeneration of left eye    . Osteopenia    . Stroke [facility])             Past Surgical  History:  Procedure Laterality Date  . CATARACT EXTRACTION               Family History  Problem Relation Age of Onset  . Cancer Daughter          breast      Social History:  reports that she has never smoked. She has never used smokeless tobacco. She reports current alcohol use of about 1.0 standard drink of alcohol per week. She reports that she does not use drugs.           Allergies  Allergen Reactions  . Ace Inhibitors        REACTION: angioedema  . Other        Blood Thinners Cause Brain Bleeds            Medications Prior to Admission  Medication Sig Dispense Refill  . acetaminophen (TYLENOL) 500 MG tablet Take 1-2 tablets (500-1,000 mg total) by mouth every 6 (six) hours as needed for mild pain or moderate pain. 30 tablet 0  . amLODipine (NORVASC) 5 MG tablet TAKE 1 TABLET BY MOUTH  DAILY 90 tablet 3  . atorvastatin (LIPITOR) 40 MG tablet TAKE 1 TABLET BY MOUTH AT  BEDTIME 90 tablet 3  . losartan (COZAAR) 100 MG tablet TAKE 1 TABLET BY MOUTH  DAILY (Patient taking differently: Take 100 mg by mouth at  bedtime.) 90 tablet 3  . Multiple Vitamin (MULTIVITAMIN) tablet Take 1 tablet by mouth daily.      . traMADol (ULTRAM) 50 MG tablet Take 1 tablet (50 mg total) by mouth every 12 (twelve) hours as needed for severe pain. (Patient not taking: No sig reported) 6 tablet 0      Home: Home Living Family/patient expects to be discharged to:: Unsure Living Arrangements: Spouse/significant other,Children Available Help at Discharge: Family,Personal care attendant Type of Home: House Home Access: Stairs to enter [employer] of Steps: 15 stairs down to basement level apartment with chair lift Home Layout: One level (pts apartment all one level after getting down to it) Bathroom Toilet: Handicapped height Home Equipment: Grab bars - tub/shower,Shower seat - built in,Grab bars - toilet,Walker - 2 wheels,Wheelchair - manual,Other (comment),Hand held shower head,Walker -  4 wheels Additional Comments: This info pulled from recent stay at [facility] and all appears to remain accurate to the best of my knowledge.  Pt and and spouse live in a basement apartment in her daughter and son-in-law's house. Everythin is very handicap accessible due to her husband also in a WC. They enter into their daughter's house and then do have to go down 16 steps to their apartment, but there is a chair lift. Pt has alwyas used the steps becuase she likes the exercise. They have a caregiver that comes every morning, but the dtr stated they are adding more help and increase time to help them both if needed. They have life alert and multiple safety features. Pt used RW at baseline in the apratment but really liked to stay active and on her feet. Still does her banking, etc.  Functional History: Prior Function Level of Independence: Independent with assistive device(s) Comments: Pt ambualtes with a RW at home, and reports being independent with all ADLs prior to recent fall Functional Status:  Mobility: Bed Mobility Overal bed mobility: Needs Assistance Bed Mobility: Sit to Supine Supine to sit: Mod assist,HOB elevated,+2 for physical assistance (due to pt's pain and anxiety) General bed mobility comments: assist for trunk and scooting to EOB. Transfers Overall transfer level: Needs assistance Equipment used: 1 person hand held assist Transfers: Sit to/from [place] to Stand: Mod assist,+2 safety/equipment Stand pivot transfers: Mod assist,+2 safety/equipment General transfer comment: Pt held to PTs arm during transfer Ambulation/Gait Ambulation/Gait assistance:  (unable today due to anxiety)   ADL:   Cognition: Cognition Overall Cognitive Status: Impaired/Different from baseline Orientation Level: Oriented to person Cognition Arousal/Alertness: Awake/alert Behavior During Therapy: Anxious (Pt very anxious about mobilizing and her current situation) Overall  Cognitive Status: Impaired/Different from baseline Area of Impairment:  (per daughter pt not remembering things since the original fall several days ago)   Blood pressure (!) 155/68, pulse 64, temperature 98 F (36.7 C), temperature source Oral, resp. rate 17, SpO2 95 %. Physical Exam General: Alert, No apparent distress HEENT: Head is normocephalic, atraumatic, PERRLA, EOMI, sclera anicteric, oral mucosa pink and moist, dentition intact, ext ear canals clear,  Neck: Supple without JVD or lymphadenopathy Heart: Reg rate and rhythm. No murmurs rubs or gallops Chest: CTA bilaterally without wheezes, rales, or rhonchi; no distress Abdomen: Soft, non-tender, non-distended, bowel sounds positive. Extremities: No clubbing, cyanosis, or edema. Pulses are 2+ Skin: Clean and intact without signs of breakdown Neuro: Anxious and fearful appearing--disoriented and tended to reply "I don't know" to most questions. She was oriented X 3 with cues. Slow movement and right knee pain due to  stiffness/ OA. LUE limited by pain.   Musculoskeletal: LUE with sling in place. Psych: Pt's affect is appropriate. Pt is cooperative  Lab Results Last 24 Hours       Results for orders placed or performed during the hospital encounter of 08/17/20 (from the past 24 hour(s))  Basic metabolic panel     Status: Abnormal    Collection Time: 08/19/20  2:13 AM  Result Value Ref Range    Sodium 136 135 - 145 mmol/L    Potassium 3.7 3.5 - 5.1 mmol/L    Chloride 102 98 - 111 mmol/L    CO2 23 22 - 32 mmol/L    Glucose, Bld 122 (H) 70 - 99 mg/dL    BUN 22 8 - 23 mg/dL    Creatinine, Ser 1.16 (H) 0.44 - 1.00 mg/dL    Calcium 8.1 (L) 8.9 - 10.3 mg/dL    GFR, Estimated 42 (L) >60 mL/min    Anion gap 11 5 - 15  CBC     Status: Abnormal    Collection Time: 08/19/20  2:13 AM  Result Value Ref Range    WBC 9.1 4.0 - 10.5 K/uL    RBC 3.78 (L) 3.87 - 5.11 MIL/uL    Hemoglobin 10.8 (L) 12.0 - 15.0 g/dL    HCT 32.4 (L) 36.0 - 46.0  %    MCV 85.7 80.0 - 100.0 fL    MCH 28.6 26.0 - 34.0 pg    MCHC 33.3 30.0 - 36.0 g/dL    RDW 13.9 11.5 - 15.5 %    Platelets 189 150 - 400 K/uL    nRBC 0.0 0.0 - 0.2 %       Imaging Results (Last 48 hours)  DG Chest 1 View   Result Date: 08/17/2020 CLINICAL DATA:  Altered mental status EXAM: CHEST  1 VIEW COMPARISON:  08/12/2020 FINDINGS: Mild cardiomegaly. Atherosclerotic calcification of the aortic knob. Calcification of the mitral annulus. No focal airspace consolidation, pleural effusion, or pneumothorax. IMPRESSION: No active disease. Electronically Signed   By: Davina Poke D.O.   On: 08/17/2020 12:24    CT HEAD WO CONTRAST   Result Date: 08/18/2020 CLINICAL DATA:  Follow-up suspected cerebral hemorrhage EXAM: CT HEAD WITHOUT CONTRAST TECHNIQUE: Contiguous axial images were obtained from the base of the skull through the vertex without intravenous contrast. COMPARISON:  Six days ago FINDINGS: Brain: Small volume subarachnoid hemorrhage along the left frontal convexity, non progressed. There is also small volumes and stable subarachnoid blood at the right occipital lobe. Subdural hematoma with high and low-density components along the left cerebral convexity, with thickness increase in the left parietal region where it measures 8 mm. The increased from prior is mainly low-density. Thin component along the tentorium does not appear progressed. There is slight rightward midline shift which measures 2 mm at most. Small volume intraventricular hemorrhage at the exit ule horn of the left lateral ventricle. Stable encephalomalacia in the lateral right temporal lobe related to large volume hemorrhage based on a 2016 MRI. Right parafalcine meningioma anteriorly and measuring 9 mm. Vascular: No hyperdense vessel or unexpected calcification. Skull: Normal. Negative for fracture or focal lesion. Sinuses/Orbits: No acute finding. IMPRESSION: 1. Subdural hematoma along the left cerebral convexity  with increased low-density component in the parietal region. The collection measures up to 8 mm in thickness; 2 mm of midline shift. 2. Unchanged subarachnoid hemorrhage. Small volume intraventricular hemorrhage is now seen. Electronically Signed   By: Neva Seat.D.  On: 08/18/2020 08:52         Assessment/Plan: Diagnosis: SAH 1. Does the need for close, 24 hr/day medical supervision in concert with the patient's rehab needs make it unreasonable for this patient to be served in a less intensive setting? Yes 2. Co-Morbidities requiring supervision/potential complications: right temporal intracranial hemorrhage with hypertensive crisis in 2016, hypertension, macular degeneration, meningioma, paroxysmal atrial fibrillation 3. Due to bladder management, bowel management, safety, skin/wound care, disease management, medication administration, pain management and patient education, does the patient require 24 hr/day rehab nursing? Yes 4. Does the patient require coordinated care of a physician, rehab nurse, therapy disciplines of PT, OT, SLP to address physical and functional deficits in the context of the above medical diagnosis(es)? Yes Addressing deficits in the following areas: balance, endurance, locomotion, strength, transferring, bowel/bladder control, bathing, dressing, feeding, grooming, toileting, cognition and psychosocial support 5. Can the patient actively participate in an intensive therapy program of at least 3 hrs of therapy per day at least 5 days per week? Yes 6. The potential for patient to make measurable gains while on inpatient rehab is excellent 7. Anticipated functional outcomes upon discharge from inpatient rehab are modified independent and supervision  with PT, supervision with OT, supervision with SLP. 8. Estimated rehab length of stay to reach the above functional goals is: 14-18 days 9. Anticipated discharge destination: Home 10. Overall Rehab/Functional Prognosis:  excellent   RECOMMENDATIONS: This patient's condition is appropriate for continued rehabilitative care in the following setting: CIR Patient has agreed to participate in recommended program. Yes Note that insurance prior authorization may be required for reimbursement for recommended care.   Comment:  1) SAH: admit to CIR once auth obtained and bed is available. 2) Impaired cognition: family confirms 24/7 support upon discharge 3) Impaired mobility and ADLs: unable to attempt ambulation due to anxiety: provide counseling regarding her fear of falling. 4) Dry lips: ordered vaseline for her lips.   Thank you for this consult. Admission coordinator to follow.    I have personally performed a face to face diagnostic evaluation, including, but not limited to relevant history and physical exam findings, of this patient and developed relevant assessment and plan.  Additionally, I have reviewed and concur with the physician assistant's documentation above.   Leeroy Cha, MD   Bary Leriche, PA-C 08/19/2020          Revision History                             Routing History                   Note Details  Author Ranell Patrick, Clide Deutscher, MD File Time 08/19/2020  2:40 PM  Author Type Physician Status Signed  Last Editor Izora Ribas, MD Service Physical Medicine and [place] # [phone] [place] Date 08/26/2020

## 2020-08-26 NOTE — Progress Notes (Signed)
Inpatient Rehabilitation  Patient information reviewed and entered into eRehab system by Nero Sawatzky M. Emmelyn Schmale, M.A., CCC/SLP, PPS Coordinator.  Information including medical coding, functional ability and quality indicators will be reviewed and updated through discharge.    

## 2020-08-26 NOTE — Progress Notes (Signed)
Patient transferred to [facility], reported to nurse Dauda, daughter is aware about her transfer, all belongings sent with patient.

## 2020-08-26 NOTE — Plan of Care (Signed)

## 2020-08-26 NOTE — Progress Notes (Signed)
Inpatient Rehabilitation Medication Review by a Pharmacist  A complete drug regimen review was completed for this patient to identify any potential clinically significant medication issues.  Clinically significant medication issues were identified:  no  Check AMION for pharmacist assigned to patient if future medication questions/issues arise during this admission.  Pharmacist comments:   Atrovastatin and prn Tylenol on hold due to elevated transaminases. Also holding Amlodipine.  Losartan on hold due to AKI, Creatinine improved but BUN trended up. Monitoring,  Off opioids and Tramadol due to encephalopathy, noted almost resolved. On topical pain meds.  Time spent performing this drug regimen review (minutes):  10   [name], [place] 08/26/2020 4:13 PM

## 2020-08-26 NOTE — Progress Notes (Addendum)
Patient arrived on the unit in bed with nursing staff. A&O at the time, denied pain or discomfort. Assessment done, vitals stable. Safety plan and POC review with patient. We continue to monitor. Patient was very protective of her left side yesterday not allowing staff to do a full skin assessment. She was crying of being cold and scared. When full skin assessment done this morning RN discovered unstageble to left breast and stage II to left arm. PA notified. We continue to monitor.

## 2020-08-26 NOTE — Progress Notes (Signed)
[name], OT  Rehab Admission Coordinator  Physical Medicine and Rehabilitation  PMR Pre-admission     Signed  Date of Service:  08/19/2020  4:39 PM      Related encounter: Admission (Discharged) from 08/17/2020 in [place]       Signed          Show:Clear all '[x]' Manual'[x]' Template'[x]' Copied  Added by: '[x]' [name], OT   '[]' Hover for details  PMR Admission Coordinator Pre-Admission Assessment   Patient: [name] is an 87 y.o., female MRN: [id number] DOB: [birth year] Height:   Weight:                                                                                                                                                    Insurance Information HMO: yes    PPO:      PCP:      IPA:      80/20:      OTHER:    PRIMARY: [employer] Medicare      Policy#: [id number]      Subscriber: patient CM Name: [name]       Phone#: [phone]     Fax#: [phone] Pre-Cert#: [id number]      Employer:  [employer]auf[employer] provided by [name] with [name] for admit to CIR. Next review date is 08/30/20. Clinicals to be faxed (f): [phone] Benefits:  Phone #:online      Name: [email or web address] Eff. Date: 08/24/2020-present     Deduct: $0 (does not have deductible)      Out of Pocket Max: $3,600 ($0 met)      Life Max:   CIR: $295/day co-pay for days 1-5, $0/day co-pay for days 6+      SNF: $0/day co-pay for days 1-20, $188/day co-pay for days 21-10, $0/day co-pay for days 41-100; limtied to 100 days/cal yr Outpatient: $30/visit co-pay ; limited by medical necessity     Home Health: 100% coverage, 0% co-insurance; limited by medical necessity      DME: 80% coverage, 20% co-insurance Providers:   SECONDARY: None      Policy#:       Phone#:    Development worker, community:       Phone#:    The Therapist, art Information Summary" for patients in Inpatient Rehabilitation Facilities with attached "Privacy Act [place] Records" was provided and  verbally reviewed with: Patient and Family   Emergency Contact Information         Contact Information     Name Relation Home Work Mobile    [name],Carolyn Daughter [phone]   [phone]    [name] [phone]   [phone]       Current Medical History  Patient Admitting Diagnosis: SAH   History of Present Illness:  [name] is a 87 year old right-handed female with history of hypertension  macular degeneration right temporal ICH secondary to hypertensive crisis 2016, amyloid angiopathy, CKD stage III, PAF.  Patient with recent left frontal lobe SAH left frontal parietal SDH after a fall with left wrist radial head fracture 08/11/2020 with recommendations by orthopedic services for sling and outpatient follow-up with Dr. Lorin Mercy and currently nonweightbearing.  She was discharged home 08/16/2020 from [facility] although skilled nursing facility was recommended but readmitted 08/17/2020 with ongoing confusion with lethargy and inability to care for herself.  Per chart review patient currently lives with spouse and daughter.  1 level home with 15 steps down to basement level apartment with chairlift.  They have a caregiver that comes in every morning but the daughter is adding more care in the home as needed.  They have a life alert and multiple safety features in the house.  Patient was using rolling walker at baseline prior to SDH.  Follow-up cranial CT scan showed increase in left parietal SAH and slight 2 mm rightward midline shift.  Lethargy felt to be multifactorial due to tramadol as well as acute on chronic renal failure.  Admission chemistries glucose 126 BUN 33 creatinine 1.18, hemoglobin 11.6 WBC 12,500, TSH 2.786, total CK 36, urinalysis negative nitrite.  Patient was placed on IV fluids for hydration with latest creatinine 0.82.  Neurosurgery consulted advised conservative care of traumatic SAH/SDH.  Blood pressure monitored.  ARB on hold due to renal  insufficiency.  She is tolerating a regular diet.  Close monitoring pain control currently with Lidoderm patch tramadol has been discontinued.  Due to patient decrease in functional ability altered mental status is to be admitted for a comprehensive rehab program on 08/26/20.   Past Medical History      Past Medical History:  Diagnosis Date  . Hypertension    . Macular degeneration of left eye    . Osteopenia    . Stroke [facility])        Family History  family history includes Cancer in her daughter.   Prior Rehab/Hospitalizations:  Has the patient had prior rehab or hospitalizations prior to admission? Yes   Has the patient had major surgery during 100 days prior to admission? No   Current Medications    Current Facility-Administered Medications:  .  diclofenac Sodium (VOLTAREN) 1 % topical gel 2 g, 2 g, Topical, QID, Elodia Florence., MD, 2 g at 08/26/20 0947 .  hydrALAZINE (APRESOLINE) tablet 25 mg, 25 mg, Oral, Q6H PRN, Wynetta Fines T, MD, 25 mg at 08/21/20 1450 .  ipratropium-albuterol (DUONEB) 0.5-2.5 (3) MG/3ML nebulizer solution 3 mL, 3 mL, Nebulization, Q4H PRN, Lang Snow, FNP, 3 mL at 08/19/20 2140 .  lidocaine (LIDODERM) 5 % 1 patch, 1 patch, Transdermal, Q24H, Lequita Halt, MD, 1 patch at 08/25/20 1341 .  multivitamin with minerals tablet 1 tablet, 1 tablet, Oral, Daily, Lequita Halt, MD, 1 tablet at 08/26/20 3557   Patients Current Diet:     Diet Order                      Diet - low sodium heart healthy              Diet Heart Room service appropriate? Yes; Fluid consistency: Thin  Diet effective now                      Precautions / Restrictions Precautions Precautions: Fall Precaution Comments: Sling to immobilize LUE, no formal NWB status  at this time. Hx of left wrist fracture - splint from home applied to left wrist Restrictions Weight Bearing Restrictions: Yes LUE Weight Bearing: Partial weight bearing Other Position/Activity Restrictions:  L UE: no order however keeping NWB due to sling and shoulder pain    Has the patient had 2 or more falls or a fall with injury in the past year?Yes   Prior Activity Level Community (5-7x/wk): active, independent PTA; used RW in the house; would climb hills outside per daughter   Prior Functional Level Prior Function Level of Independence: Independent with assistive device(s) Comments: Pt ambualtes with a RW at home, and reports being independent with all ADLs prior to recent fall   Self Care: Did the patient need help bathing, dressing, using the toilet or eating?  Independent   Indoor Mobility: Did the patient need assistance with walking from room to room (with or without device)? Independent   Stairs: Did the patient need assistance with internal or external stairs (with or without device)? Independent   Functional Cognition: Did the patient need help planning regular tasks such as shopping or remembering to take medications? Independent   Home Assistive Devices / Equipment Home Equipment: Grab bars - tub/shower,Shower seat - built in,Grab bars - toilet,Walker - 2 wheels,Wheelchair - manual,Other (comment),Hand held shower head,Walker - 4 wheels   Prior Device Use: Indicate devices/aids used by the patient prior to current illness, exacerbation or injury? Walker   Current Functional Level Cognition   Overall Cognitive Status: Impaired/Different from baseline Current Attention Level: Selective Orientation Level: Oriented X4 Following Commands: Follows one step commands with increased time General Comments: Patient with increasing anxiety which hinders memory/attention to tasks    Extremity Assessment (includes Sensation/Coordination)   Upper Extremity Assessment: RUE deficits/detail,LUE deficits/detail RUE Deficits / Details: Grossly 4-/5 LUE Deficits / Details: L UE in sling. reports of more pain in shoulder than in elbow. wrist, hand elbow ROM WFL. LUE: Unable to fully  assess due to immobilization,Unable to fully assess due to pain LUE Sensation: WNL LUE Coordination: decreased gross motor  Lower Extremity Assessment: Defer to PT evaluation     ADLs   Overall ADL's : Needs assistance/impaired Eating/Feeding: Set up,Sitting Eating/Feeding Details (indicate cue type and reason): Setup to open containers, prepare coffee Grooming: Sitting,Minimal assistance Grooming Details (indicate cue type and reason): will be MIn A for bimanual tasks. able to wash face with setup, don glassess with one hand. assistance needed to clean glasses Upper Body Bathing: Maximal assistance,Sitting Lower Body Bathing: Maximal assistance,Sit to/from stand,Sitting/lateral leans Upper Body Dressing : Maximal assistance,Sitting Lower Body Dressing: Total assistance,Sit to/from stand Toileting- Water quality scientist and Hygiene: Total assistance,Sit to/from stand,Sitting/lateral lean General ADL Comments: Pt limited by pain, anxiety and decreased standing balance     Mobility   Overal bed mobility: Needs Assistance Bed Mobility: Sit to Supine Supine to sit: Mod assist,HOB elevated,+2 for physical assistance (due to pt's pain and anxiety) General bed mobility comments: pt OOB in recliner at start and end of session     Transfers   Overall transfer level: Needs assistance Equipment used: 2 person hand held assist Transfers: Sit to/from Stand Sit to Stand: Mod assist Stand pivot transfers: Mod assist,+2 safety/equipment General transfer comment: sit-stand x2 with modA from recliner. pt with reports of significant pain, unable to progress to standing march or wt shift. fatigues after ~5-8 seconds and turns into maxA     Ambulation / Gait / Stairs / Wheelchair Mobility   Ambulation/Gait Ambulation/Gait assistance:  Min assist Gait Distance (Feet): 18 Feet (x 6', x 12') Assistive device: 2 person hand held assist (face to face, chair follow and physical assist provided by +2) Gait  Pattern/deviations: Step-to pattern,Decreased step length - left,Decreased stride length,Decreased weight shift to left,Trunk flexed,Wide base of support General Gait Details: pt unable to generate wt shift for stepping today due to pain Gait velocity: decreased     Posture / Balance Dynamic Sitting Balance Sitting balance - Comments: reliant on R UE support Balance Overall balance assessment: Needs assistance Sitting-balance support: Single extremity supported Sitting balance-Leahy Scale: Poor Sitting balance - Comments: reliant on R UE support Standing balance support: Single extremity supported Standing balance-Leahy Scale: Poor Standing balance comment: reliant on external support     Special needs/care consideration Skin abrasion to left eye, ecchymosis to bilateral hands; wound to left upper face            Previous Home Environment (from acute therapy documentation) Living Arrangements: Spouse/significant other,Children Available Help at Discharge: Family,Personal care attendant Type of Home: House Home Layout: One level Home Access: Stairs to enter Entrance Stairs-Rails: Left Entrance Stairs-Number of Steps: 15 stairs down to basement level apartment with chair lift Bathroom Toilet: Handicapped height Bathroom Accessibility: Yes Additional Comments: This info pulled from recent stay at [facility]. Pt and and spouse live in a basement apartment in her daughter and son-in-law's house. Everything is very handicap accessible due to her husband also in a WC. They enter into their daughter's house and then do have to go down 16 steps to their apartment, but there is a chair lift. Pt uses the steps becuase she likes the exercise. They have a caregiver that comes every morning, but the dtr stated they are adding more help and increase time to help them both if needed. They have life alert and multiple safety features. Pt used RW at baseline in the apratment but really liked to stay active and  on her feet. Still does her banking, etc.   Discharge Living Setting Plans for Discharge Living Setting: House,Lives with (comment) (daughter and her spouse) Type of Home at Discharge: House Discharge Home Layout: Two level,Able to live on main level with bedroom/bathroom (has chair lift to get to second level) Alternate Level Stairs-Rails: None Alternate Level Stairs-Number of Steps: chair lift Discharge Home Access: Ramped entrance Discharge Bathroom Shower/Tub: Walk-in shower Discharge Bathroom Toilet: Standard Discharge Bathroom Accessibility: Yes How Accessible: Accessible via wheelchair Does the patient have any problems obtaining your medications?: No   Social/Family/Support Systems Patient Roles: Spouse Contact Information: [name] (daughter): [phone] Anticipated Caregiver: daughter Anticipated Caregiver's Contact Information: see above Ability/Limitations of Caregiver: supervision Caregiver Availability: 24/7 Discharge Plan Discussed with Primary Caregiver: Yes (pt and daughter) Is Caregiver In Agreement with Plan?: Yes Does Caregiver/Family have Issues with Lodging/Transportation while Pt is in Rehab?: No     Goals Patient/Family Goal for Rehab: PT: Mod I/Supervision; OT: supervision; SLP: Supervision Expected length of stay: 14-18 days Pt/Family Agrees to Admission and willing to participate: Yes Program Orientation Provided & Reviewed with Pt/Caregiver Including Roles  & Responsibilities: Yes (pt and daughter)  Barriers to Discharge: Weight bearing restrictions (wrist in sling?)     Decrease burden of Care through IP rehab admission: Other NA     Possible need for SNF placement upon discharge:Not anticipated; pt has great social support from her family. Pt will have 24/7 Supervision at DC. Pt has been to CIR in the past and has done extremely well. Pt was active  PTA and anticipate she can reach a Supervision level through CIR stay. Pt has a very accessible home.       Patient Condition: This patient's medical and functional status has changed since the consult dated: 08/19/20 in which the Rehabilitation Physician determined and documented that the patient's condition is appropriate for intensive rehabilitative care in an inpatient rehabilitation facility. See "History of Present Illness" (above) for medical update. Functional changes are:, improvement in gait from being unable to Min A for 18 feet; pt has also been evaluated by OT service with recommendation for CIR. Patient's medical and functional status update has been discussed with the Rehabilitation physician and patient remains appropriate for inpatient rehabilitation. Will admit to inpatient rehab today.   Preadmission Screen Completed By:  [name], OT, 08/26/2020 11:21 AM ______________________________________________________________________   Discussed status with Dr. Naaman Plummer on 08/26/20 at 10:59AM and received approval for admission today.   Admission Coordinator:  [name], time: 10:59AM/Date 08/26/20             Cosigned by: Meredith Staggers, MD at 08/26/2020 11:25 AM    Revision History                    Note Details  Author [name], OT File Time 08/26/2020 11:22 AM  Author Type Rehab Admission Coordinator Status Signed  Last Editor [name], [place] [facility] and [place] # [phone] [place] Date 08/26/2020

## 2020-08-27 ENCOUNTER — Inpatient Hospital Stay (HOSPITAL_COMMUNITY): Payer: Medicare Other | Admitting: Speech Pathology

## 2020-08-27 ENCOUNTER — Inpatient Hospital Stay (HOSPITAL_COMMUNITY): Payer: Medicare Other

## 2020-08-27 ENCOUNTER — Inpatient Hospital Stay (HOSPITAL_COMMUNITY): Payer: Medicare Other | Admitting: Occupational Therapy

## 2020-08-27 DIAGNOSIS — R7401 Elevation of levels of liver transaminase levels: Secondary | ICD-10-CM

## 2020-08-27 DIAGNOSIS — S52121S Displaced fracture of head of right radius, sequela: Secondary | ICD-10-CM

## 2020-08-27 DIAGNOSIS — E785 Hyperlipidemia, unspecified: Secondary | ICD-10-CM

## 2020-08-27 DIAGNOSIS — I10 Essential (primary) hypertension: Secondary | ICD-10-CM

## 2020-08-27 DIAGNOSIS — E46 Unspecified protein-calorie malnutrition: Secondary | ICD-10-CM

## 2020-08-27 DIAGNOSIS — S065X0D Traumatic subdural hemorrhage without loss of consciousness, subsequent encounter: Secondary | ICD-10-CM

## 2020-08-27 DIAGNOSIS — E8809 Other disorders of plasma-protein metabolism, not elsewhere classified: Secondary | ICD-10-CM

## 2020-08-27 DIAGNOSIS — S52121A Displaced fracture of head of right radius, initial encounter for closed fracture: Secondary | ICD-10-CM

## 2020-08-27 LAB — CBC WITH DIFFERENTIAL/PLATELET
Abs Immature Granulocytes: 0.09 10*3/uL — ABNORMAL HIGH (ref 0.00–0.07)
Basophils Absolute: 0 10*3/uL (ref 0.0–0.1)
Basophils Relative: 0 %
Eosinophils Absolute: 0.1 10*3/uL (ref 0.0–0.5)
Eosinophils Relative: 1 %
HCT: 32.1 % — ABNORMAL LOW (ref 36.0–46.0)
Hemoglobin: 10.5 g/dL — ABNORMAL LOW (ref 12.0–15.0)
Immature Granulocytes: 1 %
Lymphocytes Relative: 9 %
Lymphs Abs: 1 10*3/uL (ref 0.7–4.0)
MCH: 28.4 pg (ref 26.0–34.0)
MCHC: 32.7 g/dL (ref 30.0–36.0)
MCV: 86.8 fL (ref 80.0–100.0)
Monocytes Absolute: 1.1 10*3/uL — ABNORMAL HIGH (ref 0.1–1.0)
Monocytes Relative: 11 %
Neutro Abs: 8.2 10*3/uL — ABNORMAL HIGH (ref 1.7–7.7)
Neutrophils Relative %: 78 %
Platelets: 322 10*3/uL (ref 150–400)
RBC: 3.7 MIL/uL — ABNORMAL LOW (ref 3.87–5.11)
RDW: 14.1 % (ref 11.5–15.5)
WBC: 10.6 10*3/uL — ABNORMAL HIGH (ref 4.0–10.5)
nRBC: 0 % (ref 0.0–0.2)

## 2020-08-27 LAB — COMPREHENSIVE METABOLIC PANEL
ALT: 401 U/L — ABNORMAL HIGH (ref 0–44)
AST: 256 U/L — ABNORMAL HIGH (ref 15–41)
Albumin: 2.1 g/dL — ABNORMAL LOW (ref 3.5–5.0)
Alkaline Phosphatase: 151 U/L — ABNORMAL HIGH (ref 38–126)
Anion gap: 8 (ref 5–15)
BUN: 19 mg/dL (ref 8–23)
CO2: 26 mmol/L (ref 22–32)
Calcium: 8.1 mg/dL — ABNORMAL LOW (ref 8.9–10.3)
Chloride: 103 mmol/L (ref 98–111)
Creatinine, Ser: 0.84 mg/dL (ref 0.44–1.00)
GFR, Estimated: >60 mL/min (ref >60)
Glucose, Bld: 107 mg/dL — ABNORMAL HIGH (ref 70–99)
Potassium: 3.9 mmol/L (ref 3.5–5.1)
Sodium: 137 mmol/L (ref 135–145)
Total Bilirubin: 1 mg/dL (ref 0.3–1.2)
Total Protein: 5.6 g/dL — ABNORMAL LOW (ref 6.5–8.1)

## 2020-08-27 MED ORDER — BLOOD PRESSURE CONTROL BOOK
Freq: Once | Status: AC
  Filled 2020-08-27: qty 1

## 2020-08-27 MED ORDER — PROSOURCE PLUS PO LIQD
30.0000 mL | Freq: Two times a day (BID) | ORAL | Status: DC
  Administered 2020-08-27 – 2020-09-10 (×26): 30 mL via ORAL
  Filled 2020-08-27 (×27): qty 30

## 2020-08-27 NOTE — Progress Notes (Signed)
[facility] PHYSICAL MEDICINE & REHABILITATION PROGRESS NOTE  Subjective/Complaints: Patient seen sitting up in her chair, working with therapy this morning.  She states she slept well overnight.  Discussed cognition and?  Baseline with therapies.  ROS: Denies CP, SOB, N/V/D  Objective: Vital Signs: Blood pressure (!) 160/80, pulse 73, temperature 98.8 F (37.1 C), resp. rate 20, height 5' (1.524 m), weight 70.8 kg, SpO2 97 %. VAS [place] LOWER EXTREMITY VENOUS (DVT)  Result Date: 08/26/2020  Lower Venous DVT Study Indications: Pain.  Comparison Study: no prior Performing Technologist: Blanch Media RVS  Examination Guidelines: A complete evaluation includes B-mode imaging, spectral Doppler, color Doppler, and power Doppler as needed of all accessible portions of each vessel. Bilateral testing is considered an integral part of a complete examination. Limited examinations for reoccurring indications may be performed as noted. The reflux portion of the exam is performed with the patient in reverse Trendelenburg.  +---------+---------------+---------+-----------+----------+-------------------+ RIGHT    CompressibilityPhasicitySpontaneityPropertiesThrombus Aging      +---------+---------------+---------+-----------+----------+-------------------+ CFV      Full           Yes      Yes                                      +---------+---------------+---------+-----------+----------+-------------------+ SFJ      Full                                                             +---------+---------------+---------+-----------+----------+-------------------+ FV Prox  Full                                                             +---------+---------------+---------+-----------+----------+-------------------+ FV Mid   Full                                                             +---------+---------------+---------+-----------+----------+-------------------+ FV DistalFull                                                              +---------+---------------+---------+-----------+----------+-------------------+ PFV      Full                                                             +---------+---------------+---------+-----------+----------+-------------------+ POP      Full           Yes      Yes                                      +---------+---------------+---------+-----------+----------+-------------------+  PTV      Full                                                             +---------+---------------+---------+-----------+----------+-------------------+ PERO                                                  Not well visualized +---------+---------------+---------+-----------+----------+-------------------+   +---------+---------------+---------+-----------+----------+-------------------+ LEFT     CompressibilityPhasicitySpontaneityPropertiesThrombus Aging      +---------+---------------+---------+-----------+----------+-------------------+ CFV      Full           Yes      Yes                                      +---------+---------------+---------+-----------+----------+-------------------+ SFJ      Full                                                             +---------+---------------+---------+-----------+----------+-------------------+ FV Prox  Full                                                             +---------+---------------+---------+-----------+----------+-------------------+ FV Mid   Full                                                             +---------+---------------+---------+-----------+----------+-------------------+ FV DistalFull                                                             +---------+---------------+---------+-----------+----------+-------------------+ PFV      Full                                                              +---------+---------------+---------+-----------+----------+-------------------+ POP      Full           Yes      Yes                                      +---------+---------------+---------+-----------+----------+-------------------+ PTV      Full                                                             +---------+---------------+---------+-----------+----------+-------------------+  PERO                                                  Not well visualized +---------+---------------+---------+-----------+----------+-------------------+     Summary: BILATERAL: - No evidence of deep vein thrombosis seen in the lower extremities, bilaterally. - No evidence of superficial venous thrombosis in the lower extremities, bilaterally. -No evidence of popliteal cyst, bilaterally.   *See table(s) above for measurements and observations. Electronically signed by Ruta Hinds MD on 08/26/2020 at 12:10:21 PM.    Final    Recent Labs    08/26/20 0150 08/27/20 0519  WBC 11.3* 10.6*  HGB 10.1* 10.5*  HCT 30.9* 32.1*  PLT 301 322   Recent Labs    08/26/20 0150 08/27/20 0519  NA 141 137  K 3.8 3.9  CL 104 103  CO2 25 26  GLUCOSE 111* 107*  BUN 24* 19  CREATININE 0.98 0.84  CALCIUM 8.1* 8.1*    Intake/Output Summary (Last 24 hours) at 08/27/2020 1017 Last data filed at 08/27/2020 0847 Gross per 24 hour  Intake 265 ml  Output --  Net 265 ml        Physical Exam: BP (!) 160/80 (BP Location: Right Arm)   Pulse 73   Temp 98.8 F (37.1 C)   Resp 20   Ht 5' (1.524 m)   Wt 70.8 kg   SpO2 97%   BMI 30.47 kg/m  Constitutional: No distress . Vital signs reviewed. HENT: Normocephalic.  Atraumatic. Eyes: EOMI. No discharge. Cardiovascular: No JVD.  RRR. Respiratory: Normal effort.  No stridor.  Bilateral clear to auscultation. GI: Non-distended.  BS +. Skin: Warm and dry.  Intact. Psych: Slowed.  Confused. Musc: No edema in extremities.  Left wrist tenderness. Neuro: Alert  and oriented x3 with increased time and cues Motor: Grossly 4/5 throughout, left upper extremity with limitations due to pain  Assessment/Plan: 1. Functional deficits which require 3+ hours per day of interdisciplinary therapy in a comprehensive inpatient rehab setting.  Physiatrist is providing close team supervision and 24 hour management of active medical problems listed below.  Physiatrist and rehab team continue to assess barriers to discharge/monitor patient progress toward functional and medical goals   Care Tool:  Bathing              Bathing assist       Upper Body Dressing/Undressing Upper body dressing        Upper body assist      Lower Body Dressing/Undressing Lower body dressing            Lower body assist       Toileting Toileting    Toileting assist Assist for toileting: Maximal Assistance - Patient 25 - 49%     Transfers Chair/bed transfer  Transfers assist     Chair/bed transfer assist level: Maximal Assistance - Patient 25 - 49%     Locomotion Ambulation   Ambulation assist              Walk 10 feet activity   Assist           Walk 50 feet activity   Assist           Walk 150 feet activity   Assist           Walk 10 feet on uneven surface  activity   Assist           Wheelchair     Assist               Wheelchair 50 feet with 2 turns activity    Assist            Wheelchair 150 feet activity     Assist           Medical Problem List and Plan: 1.Decreased functional build altered mental statussecondary to traumatic SDH/SAH/acute metabolic encephalopathy  Begin CIR evaluations 2. Antithrombotics: -DVT/anticoagulation:SCDs  Dopplers negative for DVT -antiplatelet therapy: N/A 3. Pain Management:Lidoderm patch,Voltaren gelQID  Monitor increased exertion 4. Mood:Provide emotional support -antipsychotic agents: N/A 5.  Neuropsych: This patientis?  Fully capable of making decisions on herown behalf. 6. Skin/Wound Care:Routine skin checks 7. Fluids/Electrolytes/Nutrition:Routine in and outs  -encourage appropriate PO 8. Left mildly displaced fracture of the radial head and neck.  -Nonweightbearingwithconservative care.  -Plan was to follow Dr. Lorin Mercy as an outpatient.   Will contact orthopedic service in regards to weightbearing status 9. Hyperlipidemia.  Lipitor on hold due to elevated LFTs 10.  Transaminitis.  -recent RUQ [place] suggestive of background liver disease  LFTs elevated,?  Trending up on 1/4, labs ordered for tomorrow 11. HTN: BP's borderline/stable. ARB held d/t AKI  Monitor with increased mobility 12. AKI:: Resolved  Creatinine 0.84 on 1/4 13.  Hypoalbuminemia  Supplement initiated on 1/4  LOS: 1 days A FACE TO FACE EVALUATION WAS PERFORMED  [name] 08/27/2020, 10:17 AM

## 2020-08-27 NOTE — Progress Notes (Signed)
Inpatient Rehabilitation Care Coordinator Assessment and Plan Patient Details  Name: [name] MRN: [id number] Date of Birth: Feb 21, 1918  Today's Date: 08/27/2020  Hospital Problems: Principal Problem:   Traumatic subdural hematoma (HCC) Active Problems:   Hypoalbuminemia due to protein-calorie malnutrition (HCC)   Essential hypertension   Dyslipidemia   Transaminitis   Closed displaced fracture of head of right radius  Past Medical History:  Past Medical History:  Diagnosis Date  . Hypertension   . Macular degeneration of left eye   . Osteopenia   . Stroke [facility])    Past Surgical History:  Past Surgical History:  Procedure Laterality Date  . CATARACT EXTRACTION     Social History:  reports that she has never smoked. She has never used smokeless tobacco. She reports current alcohol use of about 1.0 standard drink of alcohol per week. She reports that she does not use drugs.  Family / Support Systems Marital Status: Married Patient Roles: Spouse,Parent Spouse/Significant Other: Husband is 39 yo in good health except for arthritis and in a wheelchair Children: [name] [phone]  [phone] Other Supports: [employer] hired Engineer, production a few hours per day Anticipated Caregiver: Daughter and hired Engineer, production Ability/Limitations of Caregiver: supervision Caregiver Availability: 24/7 Family Dynamics: Close knit family both pt and husband live in the basement of daughter's home she had built for them. They have a chair lift to get down the stairs. Pt was extremely active prior to admission. She wants to get back to this level  Social History Preferred language: English Religion: Catholic Cultural Background: No issues Education: HS Read: Yes Write: Yes Employment Status: Retired Marine scientist Issues: No issues Guardian/Conservator: None-according to MD pt is capable of making her own decisions but will include daughter due to pt's very HOH issues    Abuse/Neglect Abuse/Neglect Assessment Can Be Completed: Yes Physical Abuse: Denies Verbal Abuse: Denies Sexual Abuse: Denies Exploitation of patient/patient's resources: Denies Self-Neglect: Denies  Emotional Status Pt's affect, behavior and adjustment status: Pt is motivated to do well, but is extremely HOH and can not answer the questions this worker had for her. She requested to call her daughter. According to daughter she has always been active and taken care of others and kept working to keep her muscles strong. Recent Psychosocial Issues: other health issues-falls ws recently discharged from the hospital 12/24 then re-admitted 12/25 Psychiatric History: No history deferred depression screen due to so [facility] could not participate in interview. Daughter reports she is very positive and optimistic regarding her age and function Substance Abuse History: No issues  Patient / Family Perceptions, Expectations & Goals Pt/Family understanding of illness & functional limitations: Daughter can explain her falls and blood clots, she has spoken with the neuro-surgeon and aware of the conservative approach. She is hopeful pt will do well and recover somewhat from this event with therapies. 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) Premorbid Home Care/DME Agencies: Other (Comment) (rw, wc, tub seat) Transportation available at discharge: family  Discharge Planning Living Arrangements: Spouse/significant other,Children Support Systems: Spouse/significant other,Children,Other  relatives,Friends/neighbors,Church/faith community Type of Residence: Private residence Insurance Resources: Media planner (specify) Investment banker, operational) Financial Resources:  Social Security,Family Support Financial Screen Referred: No Living Expenses: Lives with family Money Management: Family Does the patient have any problems obtaining your medications?: No Home Management: Hired assist and family Patient/Family Preliminary Plans: Return home with hisband and family daughter may hired more assist for her if needed. All depends upon how well she does here and what she needs. Daughter wants her home with Dad-pt'husband. Care Coordinator Barriers to Discharge: Other (comments) Care Coordinator Barriers to Discharge Comments: Extremely [facility] Care Coordinator Anticipated Follow Up Needs: HH/OP  Clinical Impression Pleasant active 87 yo female who has ben to rehab when she ws 89-in 2016 and did well. Daughter is hopeful she will do just as well and not require 24/7 care. Will await team's evaluations and work on discharge needs.   Elease Hashimoto 08/27/2020, 2:00 PM

## 2020-08-27 NOTE — Progress Notes (Signed)
Inpatient Rehabilitation Center Individual Statement of Services  Patient Name:  [name]  Date:  08/27/2020  Welcome to the Inpatient Rehabilitation Center.  Our goal is to provide you with an individualized program based on your diagnosis and situation, designed to meet your specific needs.  With this comprehensive rehabilitation program, you will be expected to participate in at least 3 hours of rehabilitation therapies Monday-Friday, with modified therapy programming on the weekends.  Your rehabilitation program will include the following services:  Physical Therapy (PT), Occupational Therapy (OT), Speech Therapy (ST), 24 hour per day rehabilitation nursing, Care Coordinator, Rehabilitation Medicine, Nutrition Services and Pharmacy Services  Weekly team conferences will be held on Wednesday to discuss your progress.  Your Inpatient Rehabilitation Care Coordinator will talk with you frequently to get your input and to update you on team discussions.  Team conferences with you and your family in attendance may also be held.  Expected length of stay: 14 days  Overall anticipated outcome: Supervision-CGA level  Depending on your progress and recovery, your program may change. Your Inpatient Rehabilitation Care Coordinator will coordinate services and will keep you informed of any changes. Your Inpatient Rehabilitation Care Coordinator's name and contact numbers are listed  below.  The following services may also be recommended but are not provided by the Inpatient Rehabilitation Center:   Driving Evaluations  Home Health Rehabiltiation Services  Outpatient Rehabilitation Services    Arrangements will be made to provide these services after discharge if needed.  Arrangements include referral to agencies that provide these services.  Your insurance has been verified to be:  [employer]-Medicare Your primary doctor is:  Tana Conch  Pertinent information will be shared with your doctor and  your insurance company.  Inpatient Rehabilitation Care Coordinator:  Dossie Der, [place] [phone] or Luna [place]  Information discussed with and copy given to patient by: Lucy Chris, 08/27/2020, 2:02 PM

## 2020-08-27 NOTE — Evaluation (Signed)
Occupational Therapy Assessment and Plan  Patient Details  Name: [name] MRN: [id number] Date of Birth: [birth year]  OT Diagnosis: apraxia, cognitive deficits and muscle weakness (generalized) Rehab Potential:   ELOS: 2 weeks   Today's Date: 08/27/2020 OT Individual Time: 1000-1100 OT Individual Time Calculation (min): 60 min     Hospital Problem: Principal Problem:   Traumatic subdural hematoma ([place]) Active Problems:   Hypoalbuminemia due to protein-calorie malnutrition ([place])   Essential hypertension   Dyslipidemia   Transaminitis   Closed displaced fracture of head of right radius   Past Medical History:  Past Medical History:  Diagnosis Date  . Hypertension   . Macular degeneration of left eye   . Osteopenia   . Stroke [facility])    Past Surgical History:  Past Surgical History:  Procedure Laterality Date  . CATARACT EXTRACTION      Assessment & Plan Clinical Impression: Patient is a 87 y.o. year old female  with history of hypertension macular degeneration right temporal ICH secondary to hypertensive crisis 2016, amyloid angiopathy, CKD stage III, PAF. Patient with recent left frontal lobe SAH left frontal parietal SDH after a fall with left wrist radial head fracture 08/11/2020 with recommendations by orthopedic services for sling and outpatient follow-up with Dr. Lorin Mercy and currently nonweightbearing. She was discharged home 08/16/2020 from [facility] [facility] although skilled nursing facility was recommended but readmitted 08/17/2020 with ongoing confusion with lethargy and inability to care for herself. Per chart review patient currently lives with spouse and daughter. 1 level home with 15 steps down to basement level apartment with chairlift. They have a caregiver that comes in every morning but the daughter is adding more care in the home as needed. They have a life alert and multiple safety features in the house. Patient was using rolling walker at baseline  prior to SDH. Follow-up cranial CT scan showed increase in left parietal SAH and slight 2 mm rightward midline shift. Lethargy felt to be multifactorial due to tramadol as well as acute on chronic renal failure. Admission chemistries glucose 126 BUN 33 creatinine 1.18, hemoglobin 11.6 WBC 12,500, TSH 2.786, total CK 36, urinalysis negative nitrite. Patient was placed on IV fluids for hydration with latest creatinine 0.82. Neurosurgery consulted advised conservative care of traumatic SAH/SDH. Blood pressure monitored. ARB on hold due to renal insufficiency. Findings of elevated LFTs Lipitor was held and continued to improve. She is tolerating a regular diet. Close monitoring pain control currently with Lidoderm patch tramadol has been discontinued.    Patient transferred to CIR on 08/26/2020 .    Patient currently requires max with basic self-care skills secondary to decreased cardiorespiratoy endurance, impaired timing and sequencing and motor apraxia, decreased motor planning, decreased problem solving and decreased safety awareness and decreased sitting balance, decreased standing balance and decreased balance strategies.  Prior to hospitalization, patient could complete ADL with modified independent .  Patient will benefit from skilled intervention to decrease level of assist with basic self-care skills and increase independence with basic self-care skills prior to discharge home with care partner.  Anticipate patient will require 24 hour supervision and minimal physical assistance and follow up home health.  OT - End of Session Activity Tolerance: Tolerates 10 - 20 min activity with multiple rests Endurance Deficit: Yes Endurance Deficit Description: Requires seated rest breaks for not just fatigue but reassurance due to anxiety and fearfullness of falling OT Assessment OT Patient demonstrates impairments in the following area(s): Balance;Cognition;Endurance;Perception;Safety OT Basic ADL's  Functional Problem(s):  Eating;Grooming;Bathing;Dressing;Toileting OT Transfers Functional Problem(s): Toilet;Tub/Shower OT Additional Impairment(s): Other (comment) (limited use of left UE due to fracture) OT Plan OT Intensity: Minimum of 1-2 x/day, 45 to 90 minutes OT Frequency: 5 out of 7 days OT Duration/Estimated Length of Stay: 2 weeks OT Treatment/Interventions: Balance/vestibular training;Cognitive remediation/compensation;Discharge planning;DME/adaptive equipment instruction;Functional mobility training;Patient/family education;Self Care/advanced ADL retraining;Splinting/orthotics;Therapeutic Activities;Therapeutic Exercise OT Self Feeding Anticipated Outcome(s): set up OT Basic Self-Care Anticipated Outcome(s): min A OT Toileting Anticipated Outcome(s): min a OT Bathroom Transfers Anticipated Outcome(s): CG/min A OT Recommendation Patient destination: Home Follow Up Recommendations: Home health OT Equipment Recommended: To be determined   OT Evaluation Precautions/Restrictions  Precautions Precautions: Fall Precaution Comments: left radial head fracture with wrist cock up: NWB through wrist and hand Restrictions Weight Bearing Restrictions: Yes LUE Weight Bearing: Non weight bearing Other Position/Activity Restrictions: L radial head fx General   Vital Signs Therapy Vitals Temp: 98.5 F (36.9 C) Temp Source: Oral Pulse Rate: 91 Resp: 20 BP: (!) 153/66 Patient Position (if appropriate): Sitting Oxygen Therapy SpO2: 98 % O2 Device: Room Air Pain Pain Assessment Pain Scale: 0-10 Pain Score: 0-No pain Home Living/Prior Functioning Home Living Family/patient expects to be discharged to:: Private residence Living Arrangements: Spouse/significant other,Children Available Help at Discharge: Family,Personal care attendant Type of Home: House Home Access: Stairs to enter [employer] of Steps: 16 stairs down to basement level apartment with chair  lift Entrance Stairs-Rails: Left Home Layout: One level Bathroom Toilet: Handicapped height Additional Comments: This info pulled from recent stay at [facility]. Pt and and spouse live in a basement apartment in her daughter and son-in-law's house. Everything is very handicap accessible due to her husband also in a WC. They enter into their daughter's house and then do have to go down 16 steps to their apartment, but there is a chair lift. Pt uses the steps becuase she likes the exercise. They have a caregiver that comes every morning, but the dtr stated they are adding more help and increase time to help them both if needed. They have life alert and multiple safety features. Pt used RW at baseline in the apratment but really liked to stay active and on her feet. Still does her banking, etc. (pt is poor historian)  Lives With: Spouse,Daughter Prior Function Level of Independence: Other (comment) (Pt is unreliable historian and will need family to provide accurate PLOF and social factors) Driving: No Vocation: Retired Surveyor, mining Baseline Vision/History: Macular Degeneration;Wears glasses Wears Glasses: At all times Patient Visual Report: No change from baseline Vision Assessment?: No apparent visual deficits Additional Comments: able to locate items in room with increased time, she denies visual changes, fields grossly intact Perception  Perception: Impaired Praxis Praxis: Impaired Praxis Impairment Details: Motor planning;Perseveration;Initiation;Ideomotor Cognition Overall Cognitive Status: Impaired/Different from baseline Arousal/Alertness: Awake/alert Orientation Level: Person;Situation Year: 2022 Month: January Day of Week: Incorrect Memory: Impaired Memory Impairment: Decreased recall of new information;Decreased short term memory;Retrieval deficit Decreased Short Term Memory: Verbal basic;Functional basic Immediate Memory Recall: Sock;Blue;Bed Memory Recall Sock: Not able to recall Memory  Recall Blue: Not able to recall Memory Recall Bed: Not able to recall Attention: Sustained;Selective Sustained Attention: Appears intact Selective Attention: Impaired Selective Attention Impairment: Verbal basic;Functional basic Awareness: Impaired Awareness Impairment: Intellectual impairment Problem Solving: Impaired Problem Solving Impairment: Verbal basic;Functional basic Executive Function: Sequencing;Organizing Sequencing: Impaired Sequencing Impairment: Manufacturing systems engineer: Impaired Organizing Impairment: Functional basic;Verbal basic Behaviors: Lability;Other (comment) (pt was very anxious) Safety/Judgment: Impaired Sensation Sensation Light Touch: Appears Intact Hot/Cold: Appears Intact Proprioception: Appears  Intact Stereognosis: Not tested Coordination Gross Motor Movements are Fluid and Coordinated: No Fine Motor Movements are Fluid and Coordinated: No Finger Nose Finger Test: patient unable to attend to task on eval Heel Shin Test: Limited due to sequencing deficits Motor  Motor Motor: Motor apraxia Motor - Skilled Clinical Observations: Significantly anxious and fearful of movement.  Trunk/Postural Assessment  Cervical Assessment Cervical Assessment: Exceptions to [facility] (forward head) Thoracic Assessment Thoracic Assessment: Exceptions to [facility] (thoracic kyphosis) Lumbar Assessment Lumbar Assessment: Exceptions to [facility] (posterior pelvic tilt) Postural Control Postural Control: Within Functional Limits  Balance Balance Balance Assessed: Yes Static Sitting Balance Static Sitting - Balance Support: Feet unsupported Static Sitting - Level of Assistance: 4: Min assist Dynamic Sitting Balance Dynamic Sitting - Balance Support: Feet supported;During functional activity Dynamic Sitting - Level of Assistance: 4: Min assist Static Standing Balance Static Standing - Balance Support: No upper extremity supported Static Standing - Level of Assistance: 4: Min  assist Dynamic Standing Balance Dynamic Standing - Balance Support: During functional activity Dynamic Standing - Level of Assistance: 3: Mod assist Extremity/Trunk Assessment RUE Assessment Active Range of Motion (AROM) Comments: shoulder flex/abd to 90, distal WFL LUE Assessment Active Range of Motion (AROM) Comments: shoulder flex/abd to 90, elbow WFL, wrist cock up in place, moving digits without difficulty or complaint - cues for NWB wrist and hand  Care Tool Care Tool Self Care Eating        Oral Care    Oral Care Assist Level: Minimal Assistance - Patient > 75%    Bathing   Body parts bathed by patient: Chest;Abdomen;Face Body parts bathed by helper: Right arm;Left arm;Chest;Abdomen;Front perineal area;Buttocks;Right upper leg;Left upper leg;Right lower leg;Left lower leg   Assist Level: Total Assistance - Patient < 25%    Upper Body Dressing(including orthotics)   What is the patient wearing?: Pull over shirt   Assist Level: Maximal Assistance - Patient 25 - 49%    Lower Body Dressing (excluding footwear)   What is the patient wearing?: Pants;Incontinence brief Assist for lower body dressing: Total Assistance - Patient < 25%    Putting on/Taking off footwear   What is the patient wearing?: Non-skid slipper socks Assist for footwear: Total Assistance - Patient < 25%       Care Tool Toileting Toileting activity   Assist for toileting: Total Assistance - Patient < 25%     Care Tool Bed Mobility Roll left and right activity   Roll left and right assist level: Minimal Assistance - Patient > 75%    Sit to lying activity   Sit to lying assist level: Moderate Assistance - Patient 50 - 74%    Lying to sitting edge of bed activity   Lying to sitting edge of bed assist level: Moderate Assistance - Patient 50 - 74%     Care Tool Transfers Sit to stand transfer   Sit to stand assist level: Moderate Assistance - Patient 50 - 74%    Chair/bed transfer   Chair/bed  transfer assist level: Moderate Assistance - Patient 50 - 74%     Toilet transfer   Assist Level: Maximal Assistance - Patient 24 - 49%     Care Tool Cognition Expression of Ideas and Wants Expression of Ideas and Wants: Some difficulty - exhibits some difficulty with expressing needs and ideas (e.g, some words or finishing thoughts) or speech is not clear   Understanding Verbal and Non-Verbal Content Understanding Verbal and Non-Verbal Content: Sometimes understands - understands only basic  conversations or simple, direct phrases. Frequently requires cues to understand   Memory/Recall Ability *first 3 days only Memory/Recall Ability *first 3 days only: That he or she is in a hospital/hospital unit    Refer to Care Plan for [place] 1 OT Short Term Goal 1 (Week 1): patient will complete bed mobility and tolerate unsupported sitting with CS with good carryover on NWB left wrist/hand OT Short Term Goal 2 (Week 1): patient will complete UB bathing/dressing with mod a OT Short Term Goal 3 (Week 1): patient will complete toileting with mod A OT Short Term Goal 4 (Week 1): patient will complete SPT with min A  Recommendations for other services: None    Skilled Therapeutic Intervention   Patient seated in w/c, alert and pleasant although anxious and mildly confused about circumstances.  Reviewed reason for hospitalization and role of rehab staff - she is cooperative and follows basic directions for evaluation and self care tasks but requires ongoing reassurance and encouragement.  Evaluation completed as documented above.  She presents with general deconditioning, cognitive impairment, balance deficits limited her ability to perform self care and mobility tasks safely.  Reviewed role of OT, plan of care, schedule and safety measures.  She agrees to participate in self care tasks as documented below and demonstrates improved mobility and balance as she participates  and becomes more comfortable with movement.  SPT to recliner at close of session with min/mod A cues for directionality and encouragement.  Seat belt alarm set, call bell in hand (she demonstrates understanding of use).  She is calm and smiling at close of session stating that she felt better in her own clothing.      ADL ADL Grooming: Minimal assistance Where Assessed-Grooming: Sitting at sink;Wheelchair Upper Body Bathing: Maximal assistance Where Assessed-Upper Body Bathing: Sitting at sink;Wheelchair Lower Body Bathing: Maximal assistance Where Assessed-Lower Body Bathing: Sitting at sink;Wheelchair Upper Body Dressing: Maximal assistance Where Assessed-Upper Body Dressing: Wheelchair Lower Body Dressing: Dependent Where Assessed-Lower Body Dressing: Wheelchair ADL Comments: patient anxious and perseverates on being cold, improved attention to task once dressed Mobility  Bed Mobility Bed Mobility: Sit to Supine;Supine to Sit Supine to Sit: Moderate Assistance - Patient 50-74% Sit to Supine: Moderate Assistance - Patient 50-74% Transfers Sit to Stand: Moderate Assistance - Patient 50-74% Stand to Sit: Moderate Assistance - Patient 50-74%   Discharge Criteria: Patient will be discharged from OT if patient refuses treatment 3 consecutive times without medical reason, if treatment goals not met, if there is a change in medical status, if patient makes no progress towards goals or if patient is discharged from hospital.  The above assessment, treatment plan, treatment alternatives and goals were discussed and mutually agreed upon: by patient  Carlos Levering 08/27/2020, 4:22 PM

## 2020-08-27 NOTE — Evaluation (Signed)
Speech Language Pathology Assessment and Plan  Patient Details  Name: MANIYA DONOVAN MRN: [id number] Date of Birth: 30-Dec-1917  SLP Diagnosis: Speech and Language deficits;Cognitive Impairments  Rehab Potential: Good ELOS: 2 weeks    Today's Date: 08/27/2020 SLP Individual Time: 5956-3875 SLP Individual Time Calculation (min): 53 min   Hospital Problem: Principal Problem:   Traumatic subdural hematoma ([place]) Active Problems:   Hypoalbuminemia due to protein-calorie malnutrition ([place])   Essential hypertension   Dyslipidemia   Transaminitis   Closed displaced fracture of head of right radius  Past Medical History:  Past Medical History:  Diagnosis Date  . Hypertension   . Macular degeneration of left eye   . Osteopenia   . Stroke [facility])    Past Surgical History:  Past Surgical History:  Procedure Laterality Date  . CATARACT EXTRACTION      Assessment / Plan / Recommendation Clinical Impression   IEP:PIRJJOA. Meloniis a 87 year old right-handed female with history of hypertension macular degeneration right temporal ICH secondary to hypertensive crisis 2016, amyloid angiopathy, CKD stage III, PAF. Patient with recent left frontal lobe SAH left frontal parietal SDH after a fall with left wrist radial head fracture 08/11/2020 with recommendations by orthopedic services for sling and outpatient follow-up with Dr. Lorin Mercy and currently nonweightbearing. She was discharged home 08/16/2020 from [facility] although skilled nursing facility was recommended but readmitted 08/17/2020 with ongoing confusion with lethargy and inability to care for herself. Per chart review patient currently lives with spouse and daughter. 1 level home with 15 steps down to basement level apartment with chairlift. They have a caregiver that comes in every morning but the daughter is adding more care in the home as needed. They have a life alert and multiple safety features in the house. Patient  was using rolling walker at baseline prior to SDH. Follow-up cranial CT scan showed increase in left parietal SAH and slight 2 mm rightward midline shift. Lethargy felt to be multifactorial due to tramadol as well as acute on chronic renal failure. Admission chemistries glucose 126 BUN 33 creatinine 1.18, hemoglobin 11.6 WBC 12,500, TSH 2.786, total CK 36, urinalysis negative nitrite. Patient was placed on IV fluids for hydration with latest creatinine 0.82. Neurosurgery consulted advised conservative care of traumatic SAH/SDH. Blood pressure monitored. ARB on hold due to renal insufficiency. Findings of elevated LFTs Lipitor was held and continued to improve. She is tolerating a regular diet. Close monitoring pain control currently with Lidoderm patch tramadol has been discontinued. Due to patient decrease in functional ability altered mental status was admitted for a comprehensive rehab program 08/27/19 and SLP evaluation was completed 08/28/19 with results as follows:  Although no family was present to confirm pt's cognitive-linguistic baseline, pt presents with mild-moderate impairments impacting her short term memory, problem solving, and intellectual awareness. Pt was also labile throughout session, somewhat perseverative on feeling "disappointed" in herself for falling at home, and reported now she feels as though she's "not all there" when prompted to discuss acute changes in cognitive function. Pt also appeared somewhat anxious/nervous about her new surroundings, which may have influenced her performance and current deficits. Although she was initially oriented to self, situation, place, and month, at the end of session pt expressed confusion, asking SLP where she was and what day/time it was. Thus, her level of confusion may fluctuate. Pt also scored 14/13 on SLUMS Examination, which is indicative of moderately severe neurocognitive impairments. Pt was 100% intelligible in conversation, however  exhibited mild semantic paraphasias  and word-finding deficits. Pt also demonstrated a somewhat disorganized though pattern.   Recommend pt receive skilled ST services to address cognitive and expressive language deficits while inpatient in order to maximize her functional communication, independence, and safety prior to discharge.    Skilled Therapeutic Interventions          Cognitive-linguistic evaluation was administered and results were reviewed with pt (please see above for details regarding results).   SLP Assessment  Patient will need skilled Speech Lanaguage Pathology Services during CIR admission    Recommendations  Supervision: Intermittent supervision to cue for compensatory strategies (due to cognition) Postural Changes and/or Swallow Maneuvers: Seated upright 90 degrees;Upright 30-60 min after meal Oral Care Recommendations: Oral care BID Recommendations for Other Services: Neuropsych consult Patient destination: Home Follow up Recommendations: 24 hour supervision/assistance;Home Health SLP Equipment Recommended: None recommended by SLP    SLP Frequency 3 to 5 out of 7 days   SLP Duration  SLP Intensity  SLP Treatment/Interventions 2 weeks  Minumum of 1-2 x/day, 30 to 90 minutes  Cognitive remediation/compensation;Cueing hierarchy;Functional tasks;Patient/family education;Speech/Language facilitation;Therapeutic Activities    Pain Pain Assessment Pain Scale: 0-10 Pain Score: 0-No pain     SLP Evaluation Cognition Overall Cognitive Status: Impaired/Different from baseline Arousal/Alertness: Awake/alert Orientation Level: Oriented to person;Oriented to place;Oriented to situation;Other (comment) (fluctuated during session - OX4 at beginning, but asked where she was and what day it was at end prior to SLP leaving) Attention: Sustained;Selective Sustained Attention: Appears intact Selective Attention: Impaired Selective Attention Impairment: Verbal  basic;Functional basic Memory: Impaired Memory Impairment: Decreased recall of new information;Decreased short term memory;Retrieval deficit Decreased Short Term Memory: Verbal basic;Functional basic Awareness: Impaired Awareness Impairment: Intellectual impairment Problem Solving: Impaired Problem Solving Impairment: Verbal basic;Functional basic Executive Function: Sequencing;Organizing Sequencing: Impaired Sequencing Impairment: Manufacturing systems engineer: Impaired Organizing Impairment: Functional basic;Verbal basic Behaviors: Lability;Other (comment) (pt was very anxious) Safety/Judgment: Impaired  Comprehension Auditory Comprehension Overall Auditory Comprehension: Impaired Yes/No Questions: Within Functional Limits Commands: Impaired One Step Basic Commands: 50-74% accurate Conversation: Simple Interfering Components: Hearing;Processing speed;Anxiety EffectiveTechniques: Extra processing time;Increased volume;Slowed speech;Visual/Gestural cues;Repetition Visual Recognition/Discrimination Discrimination: Not tested Reading Comprehension Reading Status: Not tested Expression Expression Primary Mode of Expression: Verbal Verbal Expression Overall Verbal Expression: Impaired Initiation: No impairment Automatic Speech: Name;Social Response;Month of year;Day of week;Counting Level of Generative/Spontaneous Verbalization: Sentence Repetition: No impairment Naming: No impairment Pragmatics: No impairment Effective Techniques: Sentence completion;Semantic cues Non-Verbal Means of Communication: Not applicable Other Verbal Expression Comments: Pt with very mild word finding deficits and semantic paraphasias in conversation, question influence of anxiety on impariment Written Expression Written Expression: Not tested Oral Motor Oral Motor/Sensory Function Overall Oral Motor/Sensory Function: Within functional limits Motor Speech Overall Motor Speech: Appears within  functional limits for tasks assessed Phonation: Normal Intelligibility: Intelligible Motor Planning: Witnin functional limits Motor Speech Errors: Not applicable  Care Tool Care Tool Cognition Expression of Ideas and Wants Expression of Ideas and Wants: Some difficulty - exhibits some difficulty with expressing needs and ideas (e.g, some words or finishing thoughts) or speech is not clear   Understanding Verbal and Non-Verbal Content Understanding Verbal and Non-Verbal Content: Sometimes understands - understands only basic conversations or simple, direct phrases. Frequently requires cues to understand   Memory/Recall Ability *first 3 days only Memory/Recall Ability *first 3 days only: That he or she is in a hospital/hospital unit;Current season (although fluctuated during session)     Intelligibility: Intelligible     Short Term Goals: Week 1: SLP Short Term Goal 1 (  Week 1): Pt will recall 2 safety precautions and/or new therapy techniques with Mod A verbal/visual cues for use of aids/strategies. SLP Short Term Goal 2 (Week 1): Pt will consistently orient X4 across 3 sessions with Min A for use of aids/strategies. SLP Short Term Goal 3 (Week 1): Pt will follow basic 1 and 2 step directions with 80% accuracy provided Min A verbal/visual cues. SLP Short Term Goal 4 (Week 1): Pt will demonstrate ability to problem solve basic familiar functional situations with Min A verbal/visual cueing. SLP Short Term Goal 5 (Week 1): Pt will demonstrate ability to use word-finding strategies in functional conversation and other targeted language tasks with Supervision A cues.  Refer to Care Plan for Long Term Goals  Recommendations for other services: Neuropsych  Discharge Criteria: Patient will be discharged from SLP if patient refuses treatment 3 consecutive times without medical reason, if treatment goals not met, if there is a change in medical status, if patient makes no progress towards goals or  if patient is discharged from hospital.  The above assessment, treatment plan, treatment alternatives and goals were discussed and mutually agreed upon: No family available/patient unable  Arbutus Leas 08/27/2020, 11:44 AM

## 2020-08-27 NOTE — Progress Notes (Signed)
Patient ID: [name], female   DOB: [birth year], 87 y.o.   MRN: [id number] Met with the patient to review role of the nurse CM and collaboration with the SW to facilitate preparation for discharge. Patient given handouts on blood pressure and DASH diet with cooking with less salt for daughter's reference at discharge. Patient notes she has good set up for discharge and feeling down about her current situation. Given dementia, memory and planning issues. Follow up with daughter. Continue to follow along to discharge to address educational needs. No other questions/concerns noted at present. Margarito Liner

## 2020-08-27 NOTE — Progress Notes (Signed)
Patient daughter called about wound on her breast but she told this RN that she was notified about the wound by nursing staffs on the previous unit (6N). We continue to monitor.

## 2020-08-27 NOTE — Evaluation (Signed)
Physical Therapy Assessment and Plan  Patient Details  Name: [name] MRN: [id number] Date of Birth: [birth year]  PT Diagnosis: Abnormality of gait, Difficulty walking, Impaired cognition, Impaired sensation and Muscle weakness Rehab Potential: Fair ELOS: 2 weeks    Today's Date: 08/27/2020 PT Individual Time: 1300-1410 PT Individual Time Calculation (min): 70 min    Hospital Problem: Principal Problem:   Traumatic subdural hematoma ([place]) Active Problems:   Hypoalbuminemia due to protein-calorie malnutrition ([place])   Essential hypertension   Dyslipidemia   Transaminitis   Closed displaced fracture of head of right radius   Past Medical History:  Past Medical History:  Diagnosis Date  . Hypertension   . Macular degeneration of left eye   . Osteopenia   . Stroke [facility])    Past Surgical History:  Past Surgical History:  Procedure Laterality Date  . CATARACT EXTRACTION      Assessment & Plan Clinical Impression: Patient is a 87 year old right-handed female with history of hypertension macular degeneration right temporal ICH secondary to hypertensive crisis 2016, amyloid angiopathy, CKD stage III, PAF. Patient with recent left frontal lobe SAH left frontal parietal SDH after a fall with left wrist radial head fracture 08/11/2020 with recommendations by orthopedic services for sling and outpatient follow-up with Dr. Lorin Mercy and currently nonweightbearing. She was discharged home 08/16/2020 from [place] [facility] although skilled nursing facility was recommended but readmitted 08/17/2020 with ongoing confusion with lethargy and inability to care for herself. Per chart review patient currently lives with spouse and daughter. 1 level home with 15 steps down to basement level apartment with chairlift. They have a caregiver that comes in every morning but the daughter is adding more care in the home as needed. They have a life alert and multiple safety features in the house.  Patient was using rolling walker at baseline prior to SDH. Follow-up cranial CT scan showed increase in left parietal SAH and slight 2 mm rightward midline shift. Lethargy felt to be multifactorial due to tramadol as well as acute on chronic renal failure. Admission chemistries glucose 126 BUN 33 creatinine 1.18, hemoglobin 11.6 WBC 12,500, TSH 2.786, total CK 36, urinalysis negative nitrite. Patient was placed on IV fluids for hydration with latest creatinine 0.82. Neurosurgery consulted advised conservative care of traumatic SAH/SDH. Blood pressure monitored. ARB on hold due to renal insufficiency. Findings of elevated LFTs Lipitor was held and continued to improve. She is tolerating a regular diet. Close monitoring pain control currently with Lidoderm patch tramadol has been discontinued. Due to patient decrease in functional ability altered mental status was admitted for a comprehensive rehab program. Patient transferred to CIR on 08/26/2020 .   Patient currently requires mod with mobility secondary to muscle weakness, unbalanced muscle activation, decreased visual acuity, decreased motor planning and ideational apraxia, decreased initiation, decreased attention, decreased awareness, decreased problem solving, decreased safety awareness, decreased memory and delayed processing.  Prior to hospitalization, patient was supervision with mobility and lived with [place] in a House home.  Home access is 16 stairs down to basement level apartment with chair liftStairs to enter.  Patient will benefit from skilled PT intervention to maximize safe functional mobility, minimize fall risk and decrease caregiver burden for planned discharge home with 24 hour assist.  Anticipate patient will benefit from follow up [facility] at discharge.  PT - End of Session Activity Tolerance: Tolerates 10 - 20 min activity with multiple rests Endurance Deficit: Yes Endurance Deficit Description: Requires seated rest  breaks for not just fatigue  but reassurance due to anxiety and fearfullness of falling PT Assessment Rehab Potential (ACUTE/IP ONLY): Fair PT Barriers to Discharge: Decreased caregiver support;Home environment access/layout;Lack of/limited family support;Insurance for SNF coverage;Weight bearing restrictions PT Patient demonstrates impairments in the following area(s): Balance;Motor;Endurance;Behavior;Pain;Perception;Sensory;Safety;Skin Integrity PT Transfers Functional Problem(s): Bed Mobility;Bed to Chair;Car PT Locomotion Functional Problem(s): Ambulation;Stairs PT Plan PT Intensity: Minimum of 1-2 x/day ,45 to 90 minutes PT Frequency: 5 out of 7 days PT Duration Estimated Length of Stay: 2 weeks PT Treatment/Interventions: Ambulation/gait training;Balance/vestibular training;Cognitive remediation/compensation;Community reintegration;Discharge planning;Disease management/prevention;DME/adaptive equipment instruction;Functional mobility training;Neuromuscular re-education;Pain management;Patient/family education;Psychosocial support;Skin care/wound management;Splinting/orthotics;Therapeutic Activities;Stair training;Therapeutic Exercise;UE/LE Strength taining/ROM;UE/LE Coordination activities;Visual/perceptual remediation/compensation;Wheelchair propulsion/positioning PT Transfers Anticipated Outcome(s): supervision with LRAD PT Locomotion Anticipated Outcome(s): supervision with LRAD PT Recommendation Recommendations for Other Services: Neuropsych consult Follow Up Recommendations: Home health PT;24 hour supervision/assistance Patient destination: Home Equipment Recommended: To be determined   PT Evaluation Precautions/Restrictions Precautions Precautions: Fall Restrictions Weight Bearing Restrictions: Yes LUE Weight Bearing: Non weight bearing Other Position/Activity Restrictions: L radial head fx General Chart Reviewed: Yes Additional Pertinent History: hypertension macular  degeneration right temporal ICH secondary to hypertensive crisis 2016, amyloid angiopathy, CKD stage III, PAF Vital SignsTherapy Vitals Temp: 98.5 F (36.9 C) Temp Source: Oral Pulse Rate: 91 Resp: 20 BP: (!) 153/66 Patient Position (if appropriate): Sitting Oxygen Therapy SpO2: 98 % O2 Device: Room Air Pain Pain Assessment Pain Scale: 0-10 Pain Score: 0-No pain Home Living/Prior Functioning Home Living Living Arrangements: Spouse/significant other;Children Available Help at Discharge: Family;Personal care attendant (hired caregiver) Type of Home: House Home Access: Stairs to enter [employer] of Steps: 16 stairs down to basement level apartment with chair lift Entrance Stairs-Rails: Left Home Layout: One level Additional Comments: This info pulled from recent stay at [facility]. Pt and and spouse live in a basement apartment in her daughter and son-in-law's house. Everything is very handicap accessible due to her husband also in a WC. They enter into their daughter's house and then do have to go down 16 steps to their apartment, but there is a chair lift. Pt uses the steps becuase she likes the exercise. They have a caregiver that comes every morning, but the dtr stated they are adding more help and increase time to help them both if needed. They have life alert and multiple safety features. Pt used RW at baseline in the apratment but really liked to stay active and on her feet. Still does her banking, etc. (pt is poor historian)  Lives With: Spouse;Daughter Prior Function Level of Independence: Other (comment) (Pt is unreliable historian and will need family to provide accurate PLOF and social factors) Driving: No Vocation: Retired Art gallery manager: Impaired Praxis Praxis: Impaired Praxis Impairment Details: Motor planning;Perseveration;Initiation;Ideomotor  Cognition Overall Cognitive Status: Impaired/Different from baseline Arousal/Alertness:  Awake/alert Orientation Level: Oriented to person;Oriented to place;Oriented to time Attention: Sustained;Selective Sustained Attention: Appears intact Selective Attention: Impaired Selective Attention Impairment: Verbal basic;Functional basic Memory: Impaired Memory Impairment: Decreased recall of new information;Decreased short term memory;Retrieval deficit Decreased Short Term Memory: Verbal basic;Functional basic Awareness: Impaired Awareness Impairment: Intellectual impairment Problem Solving: Impaired Problem Solving Impairment: Verbal basic;Functional basic Executive Function: Sequencing;Organizing Sequencing: Impaired Sequencing Impairment: Manufacturing systems engineer: Impaired Organizing Impairment: Functional basic;Verbal basic Behaviors: Lability;Other (comment) (pt was very anxious) Safety/Judgment: Impaired Sensation Sensation Light Touch: Appears Intact Hot/Cold: Appears Intact Proprioception: Appears Intact Stereognosis: Not tested Coordination Gross Motor Movements are Fluid and Coordinated: No Fine Motor Movements are Fluid and Coordinated: No Heel Shin Test: Limited due to sequencing  deficits Motor  Motor Motor: Motor apraxia Motor - Skilled Clinical Observations: Significantly anxious and fearful of movement.  Trunk/Postural Assessment  Cervical Assessment Cervical Assessment: Exceptions to [facility] (forward head) Thoracic Assessment Thoracic Assessment: Exceptions to [facility] (thoracic kyphosis) Lumbar Assessment Lumbar Assessment: Exceptions to [facility] (posterior pelvic tilt) Postural Control Postural Control: Within Functional Limits  Balance Balance Balance Assessed: Yes Static Sitting Balance Static Sitting - Balance Support: Feet unsupported Static Sitting - Level of Assistance: 4: Min assist Dynamic Sitting Balance Dynamic Sitting - Balance Support: Feet supported;During functional activity Dynamic Sitting - Level of Assistance: 4: Min assist Static  Standing Balance Static Standing - Balance Support: No upper extremity supported Static Standing - Level of Assistance: 4: Min assist Dynamic Standing Balance Dynamic Standing - Balance Support: During functional activity Dynamic Standing - Level of Assistance: 3: Mod assist Extremity Assessment      RLE Assessment RLE Assessment: Exceptions to [facility] General Strength Comments: Grossly 4-/5 LLE Assessment LLE Assessment: Exceptions to [facility] General Strength Comments: Grossly 4-/5  Care Tool Care Tool Bed Mobility Roll left and right activity   Roll left and right assist level: Minimal Assistance - Patient > 75%    Sit to lying activity   Sit to lying assist level: Moderate Assistance - Patient 50 - 74%    Lying to sitting edge of bed activity   Lying to sitting edge of bed assist level: Moderate Assistance - Patient 50 - 74%     Care Tool Transfers Sit to stand transfer   Sit to stand assist level: Moderate Assistance - Patient 50 - 74%    Chair/bed transfer   Chair/bed transfer assist level: Moderate Assistance - Patient 50 - 74%     Toilet transfer   Assist Level: Maximal Assistance - Patient 24 - 49%    Car transfer   Car transfer assist level: Moderate Assistance - Patient 50 - 74%      Care Tool Locomotion Ambulation   Assist level: 2 helpers (modA with chair follow) Assistive device: Hand held assist Max distance: 57f  Walk 10 feet activity   Assist level: 2 helpers (modA with chair follow) Assistive device: Hand held assist   Walk 50 feet with 2 turns activity Walk 50 feet with 2 turns activity did not occur: Safety/medical concerns      Walk 150 feet activity Walk 150 feet activity did not occur: Safety/medical concerns      Walk 10 feet on uneven surfaces activity Walk 10 feet on uneven surfaces activity did not occur: Safety/medical concerns      Stairs Stair activity did not occur: Safety/medical concerns        Walk up/down 1 step activity Walk  up/down 1 step or curb (drop down) activity did not occur: Safety/medical concerns     Walk up/down 4 steps activity did not occuR: Safety/medical concerns  Walk up/down 4 steps activity      Walk up/down 12 steps activity Walk up/down 12 steps activity did not occur: Safety/medical concerns      Pick up small objects from floor Pick up small object from the floor (from standing position) activity did not occur: Safety/medical concerns      Wheelchair Will patient use wheelchair at discharge?: No          Wheel 50 feet with 2 turns activity      Wheel 150 feet activity        Refer to Care Plan for Long Term Goals  SHORT  TERM GOAL WEEK 1 PT Short Term Goal 1 (Week 1): Pt will complete bed mobility with no more than minA without bed features PT Short Term Goal 2 (Week 1): Pt will consistently complete bed<>chair transfers with minA and LRAD PT Short Term Goal 3 (Week 1): Pt will ambulate 57f with minA and LRAD  Recommendations for other services: Neuropsych  Skilled Therapeutic Intervention Mobility Bed Mobility Bed Mobility: Sit to Supine;Supine to Sit Supine to Sit: Moderate Assistance - Patient 50-74% Sit to Supine: Moderate Assistance - Patient 50-74% Transfers Transfers: Sit to Stand;Stand to Sit;Stand Pivot Transfers Sit to Stand: Moderate Assistance - Patient 50-74% Stand to Sit: Moderate Assistance - Patient 50-74% Stand Pivot Transfers: Moderate Assistance - Patient 50 - 74% Stand Pivot Transfer Details: Tactile cues for sequencing;Tactile cues for weight shifting;Tactile cues for posture;Tactile cues for placement;Visual cues for safe use of DME/AE;Visual cues/gestures for precautions/safety;Visual cues/gestures for sequencing;Verbal cues for sequencing;Verbal cues for precautions/safety;Manual facilitation for weight bearing;Manual facilitation for placement;Manual facilitation for weight shifting Transfer (Assistive device): None Locomotion  Gait Ambulation:  Yes Gait Assistance: 2 Helpers (modA with chair follow) Gait Distance (Feet): 15 Feet Assistive device: 1 person hand held assist;None Gait Assistance Details: Tactile cues for initiation;Tactile cues for sequencing;Tactile cues for weight shifting;Tactile cues for posture;Verbal cues for safe use of DME/AE;Verbal cues for precautions/safety;Verbal cues for gait pattern;Visual cues/gestures for precautions/safety;Visual cues for safe use of DME/AE;Verbal cues for sequencing;Tactile cues for placement Gait Gait: Yes Gait Pattern: Impaired Gait Pattern: Step-to pattern;Decreased step length - right;Decreased step length - left;Decreased stance time - right;Decreased stance time - left;Decreased stride length;Decreased hip/knee flexion - right;Decreased hip/knee flexion - left;Decreased weight shift to right;Decreased weight shift to left;Trunk flexed;Poor foot clearance - left;Poor foot clearance - right Gait velocity: decreased Stairs / Additional Locomotion Stairs: No Wheelchair Mobility Wheelchair Mobility: No  Skilled Intervention: Pt greeted seated in recliner, awake and agreeable to PT session. She reports no pain but grimmaces with LUE AROM. Pt pleasantly confused, oriented x3. She is sometimes tangential in speech and shows difficulty with articulating and completing her thoughts. Functional mobility was initiated as outlined above and pt was Instructed pt in results of PT evaluation as detailed above, PT POC, rehab potential, rehab goals, and discharge recommendations. Additionally discussed CIR's policies regarding fall safety and use of chair alarm and/or quick release belt. Pt verbalized understanding and in agreement. Will update pt's family members as they become available.   She required min/modA for sit<>stands and modA for stand<>pivot transfers. Pt appears very fearful and anxious regarding functional mobility. She performed car transfers with modA and was able to ambulate ~178f with modA and no AD via face-to-face technique with +2 for w/c follow for safety. She performed seated LAQ with 1.5# ankle weights and attempted to perform supine there-ex but pt was unable to tolerate laying flat on mat table. Unable to describe why she was uncomfortable but she appeared to have significant distress with this position and would likely benefit from wedge for comfort. She then performed side stepping, L<>R, [id number] direction, with minA and no AD; again requiring continuous encouragement and calming as she begins to get anxious. She was returned to her room in her w/c and she remained seated in w/c with safety belt alarm on and needs in reach, made comfortable.  Discharge Criteria: Patient will be discharged from PT if patient refuses treatment 3 consecutive times without medical reason, if treatment goals not met, if there is a change in  medical status, if patient makes no progress towards goals or if patient is discharged from hospital.  The above assessment, treatment plan, treatment alternatives and goals were discussed and mutually agreed upon: by patient  Alger Simons PT, DPT 08/27/2020, 2:17 PM

## 2020-08-28 ENCOUNTER — Inpatient Hospital Stay (HOSPITAL_COMMUNITY): Payer: Medicare Other | Admitting: Occupational Therapy

## 2020-08-28 ENCOUNTER — Telehealth: Payer: Self-pay

## 2020-08-28 ENCOUNTER — Inpatient Hospital Stay (HOSPITAL_COMMUNITY): Payer: Medicare Other

## 2020-08-28 DIAGNOSIS — D72829 Elevated white blood cell count, unspecified: Secondary | ICD-10-CM

## 2020-08-28 DIAGNOSIS — L89890 Pressure ulcer of other site, unstageable: Secondary | ICD-10-CM

## 2020-08-28 DIAGNOSIS — L8990 Pressure ulcer of unspecified site, unspecified stage: Secondary | ICD-10-CM | POA: Insufficient documentation

## 2020-08-28 LAB — COMPREHENSIVE METABOLIC PANEL
ALT: 363 U/L — ABNORMAL HIGH (ref 0–44)
AST: 185 U/L — ABNORMAL HIGH (ref 15–41)
Albumin: 2.2 g/dL — ABNORMAL LOW (ref 3.5–5.0)
Alkaline Phosphatase: 142 U/L — ABNORMAL HIGH (ref 38–126)
Anion gap: 11 (ref 5–15)
BUN: 25 mg/dL — ABNORMAL HIGH (ref 8–23)
CO2: 25 mmol/L (ref 22–32)
Calcium: 8.3 mg/dL — ABNORMAL LOW (ref 8.9–10.3)
Chloride: 104 mmol/L (ref 98–111)
Creatinine, Ser: 0.82 mg/dL (ref 0.44–1.00)
GFR, Estimated: >60 mL/min (ref >60)
Glucose, Bld: 110 mg/dL — ABNORMAL HIGH (ref 70–99)
Potassium: 3.8 mmol/L (ref 3.5–5.1)
Sodium: 140 mmol/L (ref 135–145)
Total Bilirubin: 1 mg/dL (ref 0.3–1.2)
Total Protein: 5.6 g/dL — ABNORMAL LOW (ref 6.5–8.1)

## 2020-08-28 MED ORDER — LOSARTAN POTASSIUM 25 MG PO TABS
25.0000 mg | ORAL_TABLET | Freq: Every day | ORAL | Status: DC
  Administered 2020-08-28 – 2020-09-02 (×7): 25 mg via ORAL
  Filled 2020-08-28 (×7): qty 1

## 2020-08-28 MED ORDER — LIDOCAINE 4 % EX CREA
TOPICAL_CREAM | Freq: Once | CUTANEOUS | Status: DC
  Filled 2020-08-28: qty 5

## 2020-08-28 NOTE — Patient Care Conference (Signed)
Inpatient RehabilitationTeam Conference and Plan of Care Update Date: 08/28/2020   Time: 11:20 AM    Patient Name: [name]      Medical Record Number: [id number]  Date of Birth: 1918-01-21 Sex: Female         Room/Bed: 4M08C/4M08C-01 Payor Info: Payor: [employer]vertising copy[employer] MEDICARE / Plan: [employer] MEDICARE / Product Type: *No Product type* /    Admit Date/Time:  08/26/2020  2:57 PM  Primary Diagnosis:  Traumatic subdural hematoma [facility])  Hospital Problems: Principal Problem:   Traumatic subdural hematoma ([facility]) Active Problems:   Hypoalbuminemia due to protein-calorie malnutrition ([facility])   Essential hypertension   Dyslipidemia   Transaminitis   Closed displaced fracture of head of right radius   Pressure injury of skin   Leukocytosis    Expected Discharge Date: Expected Discharge Date: 09/10/20  Team Members Present: Physician leading conference: Dr. Maryla Morrow Care Coodinator Present: Chana Bode, RN, BSN, CRRN;Becky Dupree, LCSW Nurse Present: Despina Hidden, RN PT Present: Merry Lofty, PT OT Present: Dolphus Jenny, OT SLP Present: Suzzette Righter, CF-SLP PPS Coordinator present : Fae Pippin, [place], PT     Current Status/Progress Goal Weekly Team Focus  Bowel/Bladder   Patient is continent and incontinent of bladder, continent of bowel, LBM  Maintain continence  QS/PRN OFFER TOILETING   Swallow/Nutrition/ Hydration             ADL's   grooming min A, UB bathing/dressing max a, LB bathing/dressing max/dep - due to poor focus on task at start of session, SPT min A with cues and increased time  CG A/ min A  adl training, independent carryover of NWB left wrist and hand, transfer training, patient/family education, general conditioning and activity tolerance   Mobility   minA sit<>stand but modA for stand<>pivot due to posterior bias. Gait up to 4ft with modA and no AD and chair follow. She is EXTREMELY anxious with mobility and fearful of falling with trouble  redirecting at times.  Supervision with LRAD (NWB LUE)  functional transfers, pre-gait and gait training as able, standing balance. Will need to confirm with family regarding home setup and level of assist. she will need 24/7 supervision at discharge!!!!!!!   Communication   HOH, needs visuals and slowed/loud speech, mild word finding/difficulty expressing abstract thoughts/needs  supervision  following basic directions, expressing abstract needs/thoughts, thought organization   Safety/Cognition/ Behavioral Observations  Mod A  Supervision-Min  basic familiar problem solving, following multistep directions, intellectual and emergent awareness, recall with aids/strategies, consistency with orientation   Pain   Patient is very guarded and restricted with left arm, increase facial grimance when attempts are made to assess left arm, splint to wrm/wrist     QS/PRN assess, NO PAIN MEDS, KPAD APPLIED   Skin   skin Intact, brusing/ecchymotic areas to left arm     QS/PRN assess     Discharge Planning:  Home with husband, daughter and son in-law along with PCS aide. Will make daughter aware will need 24/7 care and see if can increase aide services.   Team Discussion: HTN noted, MD to restart medications. MD to follow up with ortho on  Left upper extremity weight bearing status. LFTs elevated by improving, WBC elevated by stable and pin in left upper extremity with pressure ulcer or other wound. Possible debridement or treatment pending results of mammogram/ultrasound of left breast.  Patient on target to meet rehab goals: no, currently max assist for self care and min - mod assist for  transfers with strong posterior bias and fear of falling. Progress limited by anxiety, requires cues and reassurance with supervsiion - min assist goals set  *See Care Plan and progress notes for long and short-term goals.   Revisions to Treatment Plan:   Teaching Needs: Transfers, toileting, wound care, medications,  etc.  Current Barriers to Discharge: Decreased caregiver support, Home enviroment access/layout and Wound care  Daughter works and husband is wheelchair bound  Possible Resolutions to Barriers: Family education Hired caregivers     Medical Summary Current Status: Decreased functional build altered mental status secondary to traumatic SDH/SAH/acute metabolic encephalopathy  Barriers to Discharge: Medical stability;Other (comments);Wound care;Behavior;Decreased family/caregiver support;Weight bearing restrictions  Barriers to Discharge Comments: Recent hx of left radial fracture Possible Resolutions to [employer] Focus: Therapies, follow labs - WBCs, LFTs, optimize BP   Continued Need for Acute Rehabilitation Level of Care: The patient requires daily medical management by a physician with specialized training in physical medicine and rehabilitation for the following reasons: Direction of a multidisciplinary physical rehabilitation program to maximize functional independence : Yes Medical management of patient stability for increased activity during participation in an intensive rehabilitation regime.: Yes Analysis of laboratory values and/or radiology reports with any subsequent need for medication adjustment and/or medical intervention. : Yes   I attest that I was present, lead the team conference, and concur with the assessment and plan of the team.   Dorien Chihuahua B 08/28/2020, 2:31 PM

## 2020-08-28 NOTE — Progress Notes (Signed)
Physical Therapy Session Note  Patient Details  Name: [name] MRN: [id number] Date of Birth: 03-Dec-1917  Today's Date: 08/28/2020 PT Individual Time: 0900-1010 PT Individual Time Calculation (min): 70 min   Short Term Goals: Week 1:  PT Short Term Goal 1 (Week 1): Pt will complete bed mobility with no more than minA without bed features PT Short Term Goal 2 (Week 1): Pt will consistently complete bed<>chair transfers with minA and LRAD PT Short Term Goal 3 (Week 1): Pt will ambulate 48ft with minA and LRAD  Skilled Therapeutic Interventions/Progress Updates:    Patient received sitting up in wc, agreeable to PT. She denies pain when asked. Patient not oriented to place or situation with increased anxiety asking where she is and why she's here. Patient able to don bra with MaxA and don shirt with MinA. Necrotic-looking wound to L lateral breast noted, RN aware. MaxA for donning pants completing in standing with MaxA to stand. Patient with significant fear of falling and anxiety in standing. She requires max encouragement to participate in therapeutic tasks, often stating "I'd rather be doing this at home." Patient able to complete the following seated therex with no added weight: LAQ, alternating marches, heel/toe raises (3x10). Sit <> stand x2 with MaxA, MinA to remain standing. Patient ambulating 74ft with MinA, HHA and wc follow for safety. Patient completing additional sit <> stand with ModA and marching in place x30s. Patient requiring extended rest breaks between tasks due to fatigue and anxiety. Patient returning to room in wc, seatbelt alarm on, call light within reach.   Therapy Documentation Precautions:  Precautions Precautions: Fall Precaution Comments: left radial head fracture with wrist cock up: NWB through wrist and hand Restrictions Weight Bearing Restrictions: Yes LUE Weight Bearing: Non weight bearing Other Position/Activity Restrictions: L radial head  fx    Therapy/Group: Individual Therapy  Elizebeth Koller, PT, DPT, CBIS  08/28/2020, 7:36 AM

## 2020-08-28 NOTE — Progress Notes (Signed)
Patient ID: [name], female   DOB: 1918-02-20, 87 y.o.   MRN: [id number] Met with pt and spoke with daughter-Joan via telephone to inform of team conference goals supervision-CGA and target discharge 1/18. Daughter is aware of her Mom's fear of falling and anxiety. She hopes with time it may get better but will need to arrange 24/7 care for her at home. She wants to use [facility] for home health since her father has them coming in already. Will continue to work on discharge needs.

## 2020-08-28 NOTE — Progress Notes (Addendum)
[place] PHYSICAL MEDICINE & REHABILITATION PROGRESS NOTE  Subjective/Complaints: Patient seen sitting up in her chair this AM.  She states she slept well overnight. She does not recall therapies yesterday.   ROS: Denies CP, SOB, N/V/D  Objective: Vital Signs: Blood pressure (!) 161/74, pulse 65, temperature 99.6 F (37.6 C), temperature source Oral, resp. rate 20, height 5' (1.524 m), weight 70.8 kg, SpO2 97 %. VAS [place] LOWER EXTREMITY VENOUS (DVT)  Result Date: 08/26/2020  Lower Venous DVT Study Indications: Pain.  Comparison Study: no prior Performing Technologist: Abram Sander RVS  Examination Guidelines: A complete evaluation includes B-mode imaging, spectral Doppler, color Doppler, and power Doppler as needed of all accessible portions of each vessel. Bilateral testing is considered an integral part of a complete examination. Limited examinations for reoccurring indications may be performed as noted. The reflux portion of the exam is performed with the patient in reverse Trendelenburg.  +---------+---------------+---------+-----------+----------+-------------------+ RIGHT    CompressibilityPhasicitySpontaneityPropertiesThrombus Aging      +---------+---------------+---------+-----------+----------+-------------------+ CFV      Full           Yes      Yes                                      +---------+---------------+---------+-----------+----------+-------------------+ SFJ      Full                                                             +---------+---------------+---------+-----------+----------+-------------------+ FV Prox  Full                                                             +---------+---------------+---------+-----------+----------+-------------------+ FV Mid   Full                                                             +---------+---------------+---------+-----------+----------+-------------------+ FV DistalFull                                                              +---------+---------------+---------+-----------+----------+-------------------+ PFV      Full                                                             +---------+---------------+---------+-----------+----------+-------------------+ POP      Full           Yes      Yes                                      +---------+---------------+---------+-----------+----------+-------------------+  PTV      Full                                                             +---------+---------------+---------+-----------+----------+-------------------+ PERO                                                  Not well visualized +---------+---------------+---------+-----------+----------+-------------------+   +---------+---------------+---------+-----------+----------+-------------------+ LEFT     CompressibilityPhasicitySpontaneityPropertiesThrombus Aging      +---------+---------------+---------+-----------+----------+-------------------+ CFV      Full           Yes      Yes                                      +---------+---------------+---------+-----------+----------+-------------------+ SFJ      Full                                                             +---------+---------------+---------+-----------+----------+-------------------+ FV Prox  Full                                                             +---------+---------------+---------+-----------+----------+-------------------+ FV Mid   Full                                                             +---------+---------------+---------+-----------+----------+-------------------+ FV DistalFull                                                             +---------+---------------+---------+-----------+----------+-------------------+ PFV      Full                                                              +---------+---------------+---------+-----------+----------+-------------------+ POP      Full           Yes      Yes                                      +---------+---------------+---------+-----------+----------+-------------------+ PTV      Full                                                             +---------+---------------+---------+-----------+----------+-------------------+  PERO                                                  Not well visualized +---------+---------------+---------+-----------+----------+-------------------+     Summary: BILATERAL: - No evidence of deep vein thrombosis seen in the lower extremities, bilaterally. - No evidence of superficial venous thrombosis in the lower extremities, bilaterally. -No evidence of popliteal cyst, bilaterally.   *See table(s) above for measurements and observations. Electronically signed by Ruta Hinds MD on 08/26/2020 at 12:10:21 PM.    Final    Recent Labs    08/26/20 0150 08/27/20 0519  WBC 11.3* 10.6*  HGB 10.1* 10.5*  HCT 30.9* 32.1*  PLT 301 322   Recent Labs    08/27/20 0519 08/28/20 0547  NA 137 140  K 3.9 3.8  CL 103 104  CO2 26 25  GLUCOSE 107* 110*  BUN 19 25*  CREATININE 0.84 0.82  CALCIUM 8.1* 8.3*    Intake/Output Summary (Last 24 hours) at 08/28/2020 0758 Last data filed at 08/27/2020 2020 Gross per 24 hour  Intake 880 ml  Output --  Net 880 ml     Pressure Injury 08/26/20 Breast Lateral;Left;Lower Unstageable - Full thickness tissue loss in which the base of the injury is covered by slough (yellow, tan, gray, green or brown) and/or eschar (tan, brown or black) in the wound bed. (Active)  08/26/20 1430  Location: Breast  Location Orientation: Lateral;Left;Lower  Staging: Unstageable - Full thickness tissue loss in which the base of the injury is covered by slough (yellow, tan, gray, green or brown) and/or eschar (tan, brown or black) in the wound bed.  Wound Description (Comments):    Present on Admission: Yes     Pressure Injury 08/26/20 Arm Anterior;Left;Proximal;Upper Stage 2 -  Partial thickness loss of dermis presenting as a shallow open injury with a red, pink wound bed without slough. (Active)  08/26/20 1430  Location: Arm  Location Orientation: Anterior;Left;Proximal;Upper  Staging: Stage 2 -  Partial thickness loss of dermis presenting as a shallow open injury with a red, pink wound bed without slough.  Wound Description (Comments):   Present on Admission: Yes    Physical Exam: BP (!) 161/74 (BP Location: Right Arm)   Pulse 65   Temp 99.6 F (37.6 C) (Oral)   Resp 20   Ht 5' (1.524 m)   Wt 70.8 kg   SpO2 97%   BMI 30.47 kg/m  Constitutional: No distress . Vital signs reviewed. HENT: Normocephalic.  Trauma to left superior periorbital area.. Eyes: EOMI. No discharge. Cardiovascular: No JVD.  RRR. Respiratory: Normal effort.  No stridor.  Bilateral clear to auscultation. GI: Non-distended.  BS +. Skin: Warm and dry.   Unstagable ulcer to left breast Small stage II ulcer left proximal medial arm Psych: Slowed.  Confused. Musc: No edema in extremities.  Left wrist tenderness, unchanged. Neuro: Alert HOH Motor: Grossly 4/5 throughout, left upper extremity with limitations due to pain, unchanged  Assessment/Plan: 1. Functional deficits which require 3+ hours per day of interdisciplinary therapy in a comprehensive inpatient rehab setting.  Physiatrist is providing close team supervision and 24 hour management of active medical problems listed below.  Physiatrist and rehab team continue to assess barriers to discharge/monitor patient progress toward functional and medical goals   Care Tool:  Bathing    Body parts  bathed by patient: Chest,Abdomen,Face   Body parts bathed by helper: Right arm,Left arm,Chest,Abdomen,Front perineal area,Buttocks,Right upper leg,Left upper leg,Right lower leg,Left lower leg     Bathing assist Assist Level: Total  Assistance - Patient < 25%     Upper Body Dressing/Undressing Upper body dressing   What is the patient wearing?: Pull over shirt    Upper body assist Assist Level: Maximal Assistance - Patient 25 - 49%    Lower Body Dressing/Undressing Lower body dressing      What is the patient wearing?: Pants,Incontinence brief     Lower body assist Assist for lower body dressing: Total Assistance - Patient < 25%     Toileting Toileting    Toileting assist Assist for toileting: Maximal Assistance - Patient 25 - 49%     Transfers Chair/bed transfer  Transfers assist     Chair/bed transfer assist level: Moderate Assistance - Patient 50 - 74%     Locomotion Ambulation   Ambulation assist      Assist level: 2 helpers Assistive device: Hand held assist Max distance: 34ft   Walk 10 feet activity   Assist     Assist level: 2 helpers (modA with chair follow) Assistive device: Hand held assist   Walk 50 feet activity   Assist Walk 50 feet with 2 turns activity did not occur: Safety/medical concerns         Walk 150 feet activity   Assist Walk 150 feet activity did not occur: Safety/medical concerns         Walk 10 feet on uneven surface  activity   Assist Walk 10 feet on uneven surfaces activity did not occur: Safety/medical concerns         Wheelchair     Assist Will patient use wheelchair at discharge?: No             Wheelchair 50 feet with 2 turns activity    Assist            Wheelchair 150 feet activity     Assist           Medical Problem List and Plan: 1.Decreased functional build altered mental statussecondary to traumatic SDH/SAH/acute metabolic encephalopathy  Continue CIR  Team conference today to discuss current and goals and coordination of care, home and environmental barriers, and discharge planning with nursing, case manager, and therapies. Please see conference note from today as well.  2.  Antithrombotics: -DVT/anticoagulation:SCDs  Dopplers negative for DVT -antiplatelet therapy: N/A 3. Pain Management:Lidoderm patch,Voltaren gelQID  Controlled on 1/5  Monitor increased exertion 4. Mood:Provide emotional support -antipsychotic agents: N/A 5. Neuropsych: This patientis ? not fully capable of making decisions on herown behalf. 6. Skin/Wound Care:Routine skin checks  Pressure relief to left proximal arm, healing stage II  Left lateral breast with unstagable ulcer, plan to debride today 7. Fluids/Electrolytes/Nutrition:Routine in and outs  -encourage appropriate PO 8. Left mildly displaced fracture of the radial head and neck.  -Nonweightbearingwithconservative care.  -Plan was to follow Dr. Lorin Mercy as an outpatient.   Will follow up with orthopedic service in regards to weightbearing status 9. Hyperlipidemia.  Lipitor on hold due to elevated LFTs 10.  Transaminitis.  -recent RUQ [place] suggestive of background liver disease  LFTs elevated,? Improving on 1/5, will plan end of the week 11. HTN: ARB held d/t AKI  Elevated on 1/5  Cozaar 25 started on 1/5  Monitor with increased mobility 12. AKI:: Resolved  Creatinine 0.84 on 1/4 13.  Hypoalbuminemia  Supplement initiated on 1/4 14. Leukocytosis  WBCs 10.6 on 1/4  Afebrile  Continue to monitor  LOS: 2 days A FACE TO FACE EVALUATION WAS PERFORMED  Bernardino Dowell Lorie Phenix 08/28/2020, 7:58 AM

## 2020-08-28 NOTE — Progress Notes (Signed)
Speech Language Pathology Daily Session Note  Patient Details  Name: [name] MRN: [id number] Date of Birth: 1917-11-05  Today's Date: 08/28/2020 SLP Individual Time: 1100-1155 SLP Individual Time Calculation (min): 55 min  Short Term Goals: Week 1: SLP Short Term Goal 1 (Week 1): Pt will recall 2 safety precautions and/or new therapy techniques with Mod A verbal/visual cues for use of aids/strategies. SLP Short Term Goal 2 (Week 1): Pt will consistently orient X4 across 3 sessions with Min A for use of aids/strategies. SLP Short Term Goal 3 (Week 1): Pt will follow basic 1 and 2 step directions with 80% accuracy provided Min A verbal/visual cues. SLP Short Term Goal 4 (Week 1): Pt will demonstrate ability to problem solve basic familiar functional situations with Min A verbal/visual cueing. SLP Short Term Goal 5 (Week 1): Pt will demonstrate ability to use word-finding strategies in functional conversation and other targeted language tasks with Supervision A cues.  Skilled Therapeutic Interventions: Pt seen for skilled ST with focus on cognitive communication goals. Pt remains tearful and emotional during tx session. Pt able to recall PT session from earlier this AM, however not oriented to time (thinking it was 11:30 pm) and not oriented to place, cues to scan environment and utilize posted aids increased orientation and carryover. SLP facilitating simple problem solving with mod A verbal cues, pt benefits from repetition and extra time. Pt demonstrating decreased sustained attention this date as well as poor topic maintenance. Decreased frustration tolerance during tasks impacted therapeutic activities, pt required frequent redirection. Pt left in [facility] with RN present for pain management, cont ST POC.   Pain Pain Assessment Pain Scale: 0-10 Pain Score: 0-No pain  Therapy/Group: Individual Therapy  [name] 08/28/2020, 11:39 AM

## 2020-08-28 NOTE — Telephone Encounter (Signed)
I called, left voicemail for return call. Voicemail did not state who it belonged to or that it is secure. No patient information left.

## 2020-08-28 NOTE — Telephone Encounter (Signed)
Carolyn Oka, PA with [facility], would like to know if patient's WB status could be changed after Dr. Ophelia Charter sees the patient?  Stated that patient is on the Rehab Unit at [facility] due to a traumatic subdural hematoma.  Cb# [phone].  Please advise.  Thank you.

## 2020-08-28 NOTE — Progress Notes (Signed)
Occupational Therapy Session Note  Patient Details  Name: [name] MRN: [id number] Date of Birth: 29-May-1918  Today's Date: 08/28/2020 OT Individual Time: 1400-1500 OT Individual Time Calculation (min): 60 min    Short Term Goals: Week 1:  OT Short Term Goal 1 (Week 1): patient will complete bed mobility and tolerate unsupported sitting with CS with good carryover on NWB left wrist/hand OT Short Term Goal 2 (Week 1): patient will complete UB bathing/dressing with mod a OT Short Term Goal 3 (Week 1): patient will complete toileting with mod A OT Short Term Goal 4 (Week 1): patient will complete SPT with min A  Skilled Therapeutic Interventions/Progress Updates:    Pt sitting up in w/c, appearing anxious and reporting confusion about time of day stating she thought it was night time.  Gently reoriented pt to time of day and purpose of OT session.  Pt agreeable with encouragement to complete self care at sinkside.  Pt required frequent intermittent VCs and visual cues to keep pt on task due to mild distractibility and significant anxiety. Pt bathed UB with supervision for cueing.  Pt completed UB dressing to doff/donn long sleeve shirt overhead with min assist due to pt easily frustrated and stating " I cant do it".  Pt declined LB bathing and dressing, stating she wants to do that tomorrow.  Pt needing reorientation to time of day throughout session due to pt reporting its time to go to bed and thinks its night time.  Pt ambulated to bathroom using HHA and completed toilet transfer with min assist due to fear of falling.  Pt had continent episode of urine.  Toileting completed with min assist, increased time and max simple VCs.  Pt ambulated to w/c with HHA and min assist.  Call bell in reach, seat belt alarm on.  Therapy Documentation Precautions:  Precautions Precautions: Fall Precaution Comments: left radial head fracture with wrist cock up: NWB through wrist and  hand Restrictions Weight Bearing Restrictions: Yes LUE Weight Bearing: Non weight bearing Other Position/Activity Restrictions: L radial head fx   Therapy/Group: Individual Therapy  Amie Critchley 08/28/2020, 4:55 PM

## 2020-08-28 NOTE — Progress Notes (Deleted)
Speech Language Pathology Daily Session Note  Patient Details  Name: [name] MRN: [id number] Date of Birth: July 28, 1918  Today's Date: 08/28/2020 SLP Individual Time: 1100-1155 SLP Individual Time Calculation (min): 55 min  Short Term Goals: Week 1: SLP Short Term Goal 1 (Week 1): Pt will recall 2 safety precautions and/or new therapy techniques with Mod A verbal/visual cues for use of aids/strategies. SLP Short Term Goal 2 (Week 1): Pt will consistently orient X4 across 3 sessions with Min A for use of aids/strategies. SLP Short Term Goal 3 (Week 1): Pt will follow basic 1 and 2 step directions with 80% accuracy provided Min A verbal/visual cues. SLP Short Term Goal 4 (Week 1): Pt will demonstrate ability to problem solve basic familiar functional situations with Min A verbal/visual cueing. SLP Short Term Goal 5 (Week 1): Pt will demonstrate ability to use word-finding strategies in functional conversation and other targeted language tasks with Supervision A cues.  Skilled Therapeutic Interventions:     Pain Pain Assessment Pain Scale: 0-10 Pain Score: 0-No pain  Therapy/Group: Individual Therapy  [name] 08/28/2020, 11:37 AM

## 2020-08-28 NOTE — Telephone Encounter (Signed)
Please advise 

## 2020-08-28 NOTE — Telephone Encounter (Signed)
They can try platform walker with elbow splint at 90 degrees. [place]call thanks

## 2020-08-29 ENCOUNTER — Inpatient Hospital Stay (HOSPITAL_COMMUNITY): Payer: Medicare Other | Admitting: Speech Pathology

## 2020-08-29 ENCOUNTER — Inpatient Hospital Stay (HOSPITAL_COMMUNITY): Payer: Medicare Other

## 2020-08-29 ENCOUNTER — Inpatient Hospital Stay (HOSPITAL_COMMUNITY): Payer: Medicare Other | Admitting: Occupational Therapy

## 2020-08-29 DIAGNOSIS — R0989 Other specified symptoms and signs involving the circulatory and respiratory systems: Secondary | ICD-10-CM

## 2020-08-29 NOTE — Progress Notes (Signed)
Speech Language Pathology Daily Session Note  Patient Details  Name: [name] MRN: [id number] Date of Birth: 07-02-1918  Today's Date: 08/29/2020 SLP Individual Time: 1000-1100 SLP Individual Time Calculation (min): 60 min  Short Term Goals: Week 1: SLP Short Term Goal 1 (Week 1): Pt will recall 2 safety precautions and/or new therapy techniques with Mod A verbal/visual cues for use of aids/strategies. SLP Short Term Goal 2 (Week 1): Pt will consistently orient X4 across 3 sessions with Min A for use of aids/strategies. SLP Short Term Goal 3 (Week 1): Pt will follow basic 1 and 2 step directions with 80% accuracy provided Min A verbal/visual cues. SLP Short Term Goal 4 (Week 1): Pt will demonstrate ability to problem solve basic familiar functional situations with Min A verbal/visual cueing. SLP Short Term Goal 5 (Week 1): Pt will demonstrate ability to use word-finding strategies in functional conversation and other targeted language tasks with Supervision A cues.  Skilled Therapeutic Interventions:   Patient seen for skilled ST session focusing on cognitive-linguistic goals. She was pleasant but anxious and easily startled. She was complaining of being cold and SLP got her two more blankets which she stated did help. Patient would intermittently get very frustrated or tearful but overall, was able to participate fully. When telling SLP of her fall, she would start to become tearful, saying, "I have no idea how I fell". She went on to say she hopes to be able to return to her PLOF but she doesn't want "to be a burden to my daughter". Patient exhibited frequent phonemic and semantic paraphasias, as well as perseveration on last word said, without awareness. She was able to describe action picture scenes with modA to initiate and elaborate. She has difficulty ability to fully recall and describe therapies completed and does not seem to have adequate awareness to her deficits or her progress in  therapies.Patient will continue to benefit from skilled SLP intervention to maximize cognitive-linguistic goals prior to discharge.  Pain Pain Assessment Pain Scale: 0-10 Faces Pain Scale: No hurt  Therapy/Group: Individual Therapy  Angela Nevin, MA, CCC-SLP Speech Therapy

## 2020-08-29 NOTE — Telephone Encounter (Signed)
I called, left another voicemail asking for return call.

## 2020-08-29 NOTE — Progress Notes (Signed)
Occupational Therapy Session Note  Patient Details  Name: [name] MRN: [id number] Date of Birth: 1917/12/17  Today's Date: 08/29/2020 OT Individual Time: 1300-1345 OT Individual Time Calculation (min): 45 min    Short Term Goals: Week 1:  OT Short Term Goal 1 (Week 1): patient will complete bed mobility and tolerate unsupported sitting with CS with good carryover on NWB left wrist/hand OT Short Term Goal 2 (Week 1): patient will complete UB bathing/dressing with mod a OT Short Term Goal 3 (Week 1): patient will complete toileting with mod A OT Short Term Goal 4 (Week 1): patient will complete SPT with min A  Skilled Therapeutic Interventions/Progress Updates:    1:1. P trecieved in w/c with no pain reported. Pt agreeable to OT. Pt reporting need to toilet. Pt very anxious with all mobility and practices deep breathing prior to mobility for stimulating parasympathetic nervous system to calm pt. Pt completes transfer with no AD to/from toilet with MOD A of 1 and VC for upright posture. Clothing amnagement prior to toileting completed with 1 person but second person required for completion after toileting an dposteiror hygiene. Pt completes hand hygieen seated at sink. Per ortho note PF applied to RW and pt resting in chair at end of session, exi talarm on and call light in reach  Therapy Documentation Precautions:  Precautions Precautions: Fall Precaution Comments: left radial head fracture with wrist cock up: NWB through wrist and hand Restrictions Weight Bearing Restrictions: Yes LUE Weight Bearing: Non weight bearing Other Position/Activity Restrictions: L radial head fx General:   Vital Signs:  Pain:   ADL: ADL Grooming: Minimal assistance Where Assessed-Grooming: Sitting at sink,Wheelchair Upper Body Bathing: Maximal assistance Where Assessed-Upper Body Bathing: Sitting at sink,Wheelchair Lower Body Bathing: Maximal assistance Where Assessed-Lower Body Bathing:  Sitting at sink,Wheelchair Upper Body Dressing: Maximal assistance Where Assessed-Upper Body Dressing: Wheelchair Lower Body Dressing: Dependent Where Assessed-Lower Body Dressing: Wheelchair ADL Comments: patient anxious and perseverates on being cold, improved attention to task once dressed Vision   Perception    Praxis   Exercises:   Other Treatments:     Therapy/Group: Individual Therapy  Shon Hale 08/29/2020, 12:05 PM

## 2020-08-29 NOTE — Progress Notes (Signed)
Physical Therapy Session Note  Patient Details  Name: [name] MRN: [id number] Date of Birth: 09-26-1917  Today's Date: 08/29/2020 PT Individual Time: 0800-0853 PT Individual Time Calculation (min): 53 min   Short Term Goals: Week 1:  PT Short Term Goal 1 (Week 1): Pt will complete bed mobility with no more than minA without bed features PT Short Term Goal 2 (Week 1): Pt will consistently complete bed<>chair transfers with minA and LRAD PT Short Term Goal 3 (Week 1): Pt will ambulate 11ft with minA and LRAD  Skilled Therapeutic Interventions/Progress Updates:    Pt received supine in bed, awake and agreeable to PT session. She reports no pain. She continues to be quite confused and frequently asks "where am I?!" throughout our session. Pt gently reoriented throughout but question carryover. Required totalA for threading pants while supine in bed but she was unable to pull over hips while supine. Performed supine<>sit with CGA with bed features and able to sit EOB with CGA but she required modA for scooting forwards for her feet to touch the floor. Removed hospital gown and provided her with a pull-over sweater which she required modA for donning. Sit<>Stand with maxA due to posterior bias and she was able to pull her pants over hips with modA for standing balance. Stand<>pivot with modA from EOB to w/c. Pt reporting need to void (when asked), so she ambulated ~45ft with min/modA via face-to-face HHA to 3-1 BSC that was placed over toilet. She was continent of bladder and required modA for standing where she was able to perform frontal pericare with modA for standing balance. She ambulated back to her w/c, an additional 54ft, with min/modA and similar technique. During any functional mobility, pt is VERY anxious and often works herself up, frequently expressing her high fear of falling and repeatedly saying "help me.!" Pt reassured throughout but limited carryover. Pt then wheeled sinkside to  perform hand/oral hygiene and wash her face. She stood to sink (using BUE's to pull to stand) and maintained standing for >10 minutes with minA (leaning on sink) while she completed these tasks. Pt then completed the following seated there-ex with therapist guiding technique and sequencing: -2x10 LAQ (unweighted) -2x10 hip marches (unweighted) -2x10 heel/toe raises  -1x5 sit<>stands with modA from w/c height.  Pt remained seated in w/c with safety belt alarm on and needs in reach at end of session. She was thankful for therapist intervention.  Therapy Documentation Precautions:  Precautions Precautions: Fall Precaution Comments: left radial head fracture with wrist cock up: NWB through wrist and hand Restrictions Weight Bearing Restrictions: Yes LUE Weight Bearing: Non weight bearing Other Position/Activity Restrictions: L radial head fx  Therapy/Group: Individual Therapy  Antoinett Dorman P Camaria Gerald PT 08/29/2020, 7:51 AM

## 2020-08-29 NOTE — Progress Notes (Signed)
Orthopedic Tech Progress Note Patient Details:  [name] [facility] 09-22-1917 [id number]  Ortho Devices Type of Ortho Device: Post (long arm) splint Ortho Device/Splint Location: LUE Ortho Device/Splint Interventions: Application   Post Interventions Patient Tolerated: Well   Genelle Bal [name] 08/29/2020, 11:30 PM

## 2020-08-29 NOTE — Progress Notes (Signed)
Occupational Therapy Session Note  Patient Details  Name: [name] MRN: [id number] Date of Birth: [birth year]  Today's Date: 08/29/2020 OT Individual Time: 1100-1140 OT Individual Time Calculation (min): 40 min    Short Term Goals: Week 1:  OT Short Term Goal 1 (Week 1): patient will complete bed mobility and tolerate unsupported sitting with CS with good carryover on NWB left wrist/hand OT Short Term Goal 2 (Week 1): patient will complete UB bathing/dressing with mod a OT Short Term Goal 3 (Week 1): patient will complete toileting with mod A OT Short Term Goal 4 (Week 1): patient will complete SPT with min A  Skilled Therapeutic Interventions/Progress Updates:    Pt sitting up in w/c, no c/o pain, requesting to use bathroom.  Pt completed sit to stand and ambulated with hand held assist and min guard to 3 in 1 commode over toilet.  Pt completed clothing mgt and toilet transfer with min assist.  Continent episode of urine occurred.  Pericare with CGA.  Pt required min guard and hand held assist to ambulate back to w/c.  Pts dtr arrived and asking about pts LUE weight bearing status.  Clarified precautions with PA, and educated dtr and pt regarding clearance to use platform walker with elbow splint at 90 degrees, however further clarification required regarding splint orders, and OT will follow up on this matter.  Discussed with pt and dtr regarding initiation of dc planning; per dtr she will be able to provide 24 hour supervision; dtr asking about home health resources to assist with shower level bathing if needed; CSW made aware.  Call bell in reach, seat belt alarm at end of session. Pt exhibited less anxiety today, likely due to pts dtr present for encouragement throughout session.  Therapy Documentation Precautions:  Precautions Precautions: Fall Precaution Comments: left radial head fracture with wrist cock up: NWB through wrist and hand Restrictions Weight Bearing Restrictions:  Yes LUE Weight Bearing: Non weight bearing Other Position/Activity Restrictions: L radial head fx   Therapy/Group: Individual Therapy  Amie Critchley 08/29/2020, 3:12 PM

## 2020-08-29 NOTE — Progress Notes (Signed)
[place] PHYSICAL MEDICINE & REHABILITATION PROGRESS NOTE  Subjective/Complaints: Patient seen sitting up in bed this morning.  She states to start well overnight.  When informed about her discharge date, she becomes tearful and states that she wants to go home.  Attempted to consult/educate.  Ortho note reviewed, may use platform walker with elbow splint in 90 degrees.  ROS: Denies CP, SOB, N/V/D  Objective: Vital Signs: Blood pressure (!) 150/69, pulse 69, temperature 97.9 F (36.6 C), temperature source Oral, resp. rate 18, height 5' (1.524 m), weight 70.8 kg, SpO2 97 %. No results found. Recent Labs    08/27/20 0519  WBC 10.6*  HGB 10.5*  HCT 32.1*  PLT 322   Recent Labs    08/27/20 0519 08/28/20 0547  NA 137 140  K 3.9 3.8  CL 103 104  CO2 26 25  GLUCOSE 107* 110*  BUN 19 25*  CREATININE 0.84 0.82  CALCIUM 8.1* 8.3*    Intake/Output Summary (Last 24 hours) at 08/29/2020 0831 Last data filed at 08/29/2020 0750 Gross per 24 hour  Intake 600 ml  Output -  Net 600 ml     Pressure Injury 08/26/20 Breast Lateral;Left;Lower Unstageable - Full thickness tissue loss in which the base of the injury is covered by slough (yellow, tan, gray, green or brown) and/or eschar (tan, brown or black) in the wound bed. (Active)  08/26/20 1430  Location: Breast  Location Orientation: Lateral;Left;Lower  Staging: Unstageable - Full thickness tissue loss in which the base of the injury is covered by slough (yellow, tan, gray, green or brown) and/or eschar (tan, brown or black) in the wound bed.  Wound Description (Comments):   Present on Admission: Yes     Pressure Injury 08/26/20 Arm Anterior;Left;Proximal;Upper Stage 2 -  Partial thickness loss of dermis presenting as a shallow open injury with a red, pink wound bed without slough. (Active)  08/26/20 1430  Location: Arm  Location Orientation: Anterior;Left;Proximal;Upper  Staging: Stage 2 -  Partial thickness loss of dermis  presenting as a shallow open injury with a red, pink wound bed without slough.  Wound Description (Comments):   Present on Admission: Yes    Physical Exam: BP (!) 150/69 (BP Location: Right Arm)   Pulse 69   Temp 97.9 F (36.6 C) (Oral)   Resp 18   Ht 5' (1.524 m)   Wt 70.8 kg   SpO2 97%   BMI 30.47 kg/m  Constitutional: No distress . Vital signs reviewed. HENT: Normocephalic.  Trauma to left superior periorbital area Eyes: EOMI. No discharge. Cardiovascular: No JVD.  RRR. Respiratory: Normal effort.  No stridor.  Bilateral clear to auscultation. GI: Non-distended.  BS +. Skin: Warm and dry.   Unstageable ulcer to left breast Small stage II ulcer left proximal medial arm Psych: Confused.  Delayed.  Normal behavior. Musc: Left elbow tenderness.  No tenderness in extremities. Neuro: Alert HOH Motor: Grossly 4/5 throughout, left upper extremity with limitations due to pain, stable  Assessment/Plan: 1. Functional deficits which require 3+ hours per day of interdisciplinary therapy in a comprehensive inpatient rehab setting.  Physiatrist is providing close team supervision and 24 hour management of active medical problems listed below.  Physiatrist and rehab team continue to assess barriers to discharge/monitor patient progress toward functional and medical goals   Care Tool:  Bathing    Body parts bathed by patient: Chest,Abdomen,Face   Body parts bathed by helper: Right arm,Left arm,Chest,Abdomen,Front perineal area,Buttocks,Right upper leg,Left upper leg,Right lower  leg,Left lower leg     Bathing assist Assist Level: Total Assistance - Patient < 25%     Upper Body Dressing/Undressing Upper body dressing   What is the patient wearing?: Pull over shirt    Upper body assist Assist Level: Maximal Assistance - Patient 25 - 49%    Lower Body Dressing/Undressing Lower body dressing      What is the patient wearing?: Pants,Incontinence brief     Lower body  assist Assist for lower body dressing: Total Assistance - Patient < 25%     Toileting Toileting    Toileting assist Assist for toileting: Maximal Assistance - Patient 25 - 49%     Transfers Chair/bed transfer  Transfers assist     Chair/bed transfer assist level: Maximal Assistance - Patient 25 - 49%     Locomotion Ambulation   Ambulation assist      Assist level: 2 helpers Assistive device: Hand held assist Max distance: 30   Walk 10 feet activity   Assist     Assist level: 2 helpers Assistive device: Hand held assist   Walk 50 feet activity   Assist Walk 50 feet with 2 turns activity did not occur: Safety/medical concerns         Walk 150 feet activity   Assist Walk 150 feet activity did not occur: Safety/medical concerns         Walk 10 feet on uneven surface  activity   Assist Walk 10 feet on uneven surfaces activity did not occur: Safety/medical concerns         Wheelchair     Assist Will patient use wheelchair at discharge?: No             Wheelchair 50 feet with 2 turns activity    Assist            Wheelchair 150 feet activity     Assist           Medical Problem List and Plan: 1.Decreased functional build altered mental statussecondary to traumatic SDH/SAH/acute metabolic encephalopathy  Continue CIR 2. Antithrombotics: -DVT/anticoagulation:SCDs  Dopplers negative for DVT -antiplatelet therapy: N/A 3. Pain Management:Lidoderm patch,Voltaren gelQID  Controlled on 1/6  Monitor increased exertion 4. Mood:Provide emotional support -antipsychotic agents: N/A 5. Neuropsych: This patientis not fully capable of making decisions on herown behalf. 6. Skin/Wound Care:Routine skin checks  Pressure relief to left proximal arm, healing stage II  Left lateral breast with unstagable ulcer-discussed with daughter, unclear chronicity of ulcer- ultrasound ordered to rule out  breast CA prior to debridement. 7. Fluids/Electrolytes/Nutrition:Routine in and outs  -encourage appropriate PO 8. Left mildly displaced fracture of the radial head and neck.  -Conservative care.  -Continue to follow Dr. Ophelia Charter as an outpatient.   Patient may use platform walker with elbow splint at 90 degrees per Ortho 9. Hyperlipidemia.  Lipitor on hold due to elevated LFTs 10.  Transaminitis.  -recent RUQ [place] suggestive of background liver disease  LFTs elevated,? Improving on 1/5, labs ordered for tomorrow 11. HTN: ARB held d/t AKI  Slightly labile on 1/6  Cozaar 25 started on 1/5  Monitor with increased mobility 12. AKI:: Resolved  Creatinine 0.84 on 1/4 13.  Hypoalbuminemia  Supplement initiated on 1/4 14. Leukocytosis  WBCs 10.6 on 1/4  Afebrile  Continue to monitor  LOS: 3 days A FACE TO FACE EVALUATION WAS PERFORMED  Marcelis Wissner Karis Juba 08/29/2020, 8:31 AM

## 2020-08-30 ENCOUNTER — Inpatient Hospital Stay (HOSPITAL_COMMUNITY): Payer: Medicare Other

## 2020-08-30 ENCOUNTER — Other Ambulatory Visit (HOSPITAL_COMMUNITY): Payer: Medicare Other

## 2020-08-30 LAB — COMPREHENSIVE METABOLIC PANEL
ALT: 226 U/L — ABNORMAL HIGH (ref 0–44)
AST: 75 U/L — ABNORMAL HIGH (ref 15–41)
Albumin: 2.3 g/dL — ABNORMAL LOW (ref 3.5–5.0)
Alkaline Phosphatase: 119 U/L (ref 38–126)
Anion gap: 10 (ref 5–15)
BUN: 22 mg/dL (ref 8–23)
CO2: 25 mmol/L (ref 22–32)
Calcium: 8.4 mg/dL — ABNORMAL LOW (ref 8.9–10.3)
Chloride: 106 mmol/L (ref 98–111)
Creatinine, Ser: 0.85 mg/dL (ref 0.44–1.00)
GFR, Estimated: >60 mL/min (ref >60)
Glucose, Bld: 124 mg/dL — ABNORMAL HIGH (ref 70–99)
Potassium: 3.7 mmol/L (ref 3.5–5.1)
Sodium: 141 mmol/L (ref 135–145)
Total Bilirubin: 1 mg/dL (ref 0.3–1.2)
Total Protein: 5.8 g/dL — ABNORMAL LOW (ref 6.5–8.1)

## 2020-08-30 NOTE — Progress Notes (Signed)
Occupational Therapy Session Note  Patient Details  Name: [name] MRN: [id number] Date of Birth: [birth year]  Today's Date: 08/30/2020 OT Individual Time: 1515-1600 OT Individual Time Calculation (min): 45 min    Short Term Goals: Week 1:  OT Short Term Goal 1 (Week 1): patient will complete bed mobility and tolerate unsupported sitting with CS with good carryover on NWB left wrist/hand OT Short Term Goal 2 (Week 1): patient will complete UB bathing/dressing with mod a OT Short Term Goal 3 (Week 1): patient will complete toileting with mod A OT Short Term Goal 4 (Week 1): patient will complete SPT with min A  Skilled Therapeutic Interventions/Progress Updates:     1:1. Pt received in bed agreeable to OT after arousal from sleep. Pt pleasantly confused and moderately anxious throughout session especially with mobility. Pt completes supine<>sitting EOB with mAX A overall. MIN-MOD A for sit to stand throughout with step by step cues for sequencing use of PFRW to mobilize into/out of bathroom and w/c<>standard chair in tx gym. Deep breathing cued when pt began to hyperventilate d/t anxiety. Exited session with pt seated in bed, exit alarm on and call light in reach.    Therapy Documentation Precautions:  Precautions Precautions: Fall Precaution Comments: left radial head fracture with wrist cock up: NWB through wrist and hand Restrictions Weight Bearing Restrictions: Yes LUE Weight Bearing: Non weight bearing Other Position/Activity Restrictions: L radial head fx General:   Vital Signs: Therapy Vitals Temp: 97.7 F (36.5 C) Temp Source: Oral Pulse Rate: 77 BP: (!) 155/76 Oxygen Therapy SpO2: 95 % Pain:   ADL: ADL Grooming: Minimal assistance Where Assessed-Grooming: Sitting at sink,Wheelchair Upper Body Bathing: Maximal assistance Where Assessed-Upper Body Bathing: Sitting at sink,Wheelchair Lower Body Bathing: Maximal assistance Where Assessed-Lower Body Bathing:  Sitting at sink,Wheelchair Upper Body Dressing: Maximal assistance Where Assessed-Upper Body Dressing: Wheelchair Lower Body Dressing: Dependent Where Assessed-Lower Body Dressing: Wheelchair ADL Comments: patient anxious and perseverates on being cold, improved attention to task once dressed Vision   Perception    Praxis   Exercises:   Other Treatments:     Therapy/Group: Individual Therapy  Tonny Branch 08/30/2020, 6:53 AM

## 2020-08-30 NOTE — Progress Notes (Addendum)
[place] PHYSICAL MEDICINE & REHABILITATION PROGRESS NOTE  Subjective/Complaints: Patient seen sitting up in bed, about to work with therapies.  She states she slept well overnight.  However, she informed therapist that she did not sleep well.    ROS: Unreliable due to cognition.  Objective: Vital Signs: Blood pressure (!) 155/76, pulse 77, temperature 97.7 F (36.5 C), temperature source Oral, resp. rate 19, height 5' (1.524 m), weight 70.8 kg, SpO2 95 %. No results found. No results for input(s): WBC, HGB, HCT, PLT in the last 72 hours. Recent Labs    08/28/20 0547 08/30/20 0458  NA 140 141  K 3.8 3.7  CL 104 106  CO2 25 25  GLUCOSE 110* 124*  BUN 25* 22  CREATININE 0.82 0.85  CALCIUM 8.3* 8.4*    Intake/Output Summary (Last 24 hours) at 08/30/2020 2536 Last data filed at 08/29/2020 1833 Gross per 24 hour  Intake 240 ml  Output --  Net 240 ml     Pressure Injury 08/26/20 Breast Lateral;Left;Lower Unstageable - Full thickness tissue loss in which the base of the injury is covered by slough (yellow, tan, gray, green or brown) and/or eschar (tan, brown or black) in the wound bed. (Active)  08/26/20 1430  Location: Breast  Location Orientation: Lateral;Left;Lower  Staging: Unstageable - Full thickness tissue loss in which the base of the injury is covered by slough (yellow, tan, gray, green or brown) and/or eschar (tan, brown or black) in the wound bed.  Wound Description (Comments):   Present on Admission: Yes     Pressure Injury 08/26/20 Arm Anterior;Left;Proximal;Upper Stage 2 -  Partial thickness loss of dermis presenting as a shallow open injury with a red, pink wound bed without slough. (Active)  08/26/20 1430  Location: Arm  Location Orientation: Anterior;Left;Proximal;Upper  Staging: Stage 2 -  Partial thickness loss of dermis presenting as a shallow open injury with a red, pink wound bed without slough.  Wound Description (Comments):   Present on Admission: Yes     Physical Exam: BP (!) 155/76   Pulse 77   Temp 97.7 F (36.5 C) (Oral)   Resp 19   Ht 5' (1.524 m)   Wt 70.8 kg   SpO2 95%   BMI 30.47 kg/m  Constitutional: No distress . Vital signs reviewed. HENT: Normocephalic.  Trauma to left superior periorbital area. Eyes: EOMI. No discharge. Cardiovascular: No JVD.  RRR. Respiratory: Normal effort.  No stridor.  Bilateral clear to auscultation. GI: Non-distended.  BS +. Skin: Warm and dry.   Unstageable ulcer to left breast Small stage II ulcer left proximal medial arm, improving Psych: Confused.  Delayed. Musc: Left elbow with tenderness.  No edema and other extremities.   Left upper extremity in splint. Neuro: Alert HOH Motor: Grossly 4/5 throughout, left upper extremity with limitations due to pain, unchanged  Assessment/Plan: 1. Functional deficits which require 3+ hours per day of interdisciplinary therapy in a comprehensive inpatient rehab setting.  Physiatrist is providing close team supervision and 24 hour management of active medical problems listed below.  Physiatrist and rehab team continue to assess barriers to discharge/monitor patient progress toward functional and medical goals   Care Tool:  Bathing    Body parts bathed by patient: Chest,Abdomen,Face   Body parts bathed by helper: Right arm,Left arm,Chest,Abdomen,Front perineal area,Buttocks,Right upper leg,Left upper leg,Right lower leg,Left lower leg     Bathing assist Assist Level: Total Assistance - Patient < 25%     Upper Body Dressing/Undressing Upper  body dressing   What is the patient wearing?: Pull over shirt    Upper body assist Assist Level: Maximal Assistance - Patient 25 - 49%    Lower Body Dressing/Undressing Lower body dressing      What is the patient wearing?: Pants,Incontinence brief     Lower body assist Assist for lower body dressing: Total Assistance - Patient < 25%     Toileting Toileting    Toileting assist Assist for  toileting: Maximal Assistance - Patient 25 - 49%     Transfers Chair/bed transfer  Transfers assist     Chair/bed transfer assist level: Maximal Assistance - Patient 25 - 49%     Locomotion Ambulation   Ambulation assist      Assist level: 2 helpers Assistive device: Hand held assist Max distance: 30   Walk 10 feet activity   Assist     Assist level: 2 helpers Assistive device: Hand held assist   Walk 50 feet activity   Assist Walk 50 feet with 2 turns activity did not occur: Safety/medical concerns         Walk 150 feet activity   Assist Walk 150 feet activity did not occur: Safety/medical concerns         Walk 10 feet on uneven surface  activity   Assist Walk 10 feet on uneven surfaces activity did not occur: Safety/medical concerns         Wheelchair     Assist Will patient use wheelchair at discharge?: No             Wheelchair 50 feet with 2 turns activity    Assist            Wheelchair 150 feet activity     Assist           Medical Problem List and Plan: 1.Decreased functional build altered mental statussecondary to traumatic SDH/SAH/acute metabolic encephalopathy  Continue CIR 2. Antithrombotics: -DVT/anticoagulation:SCDs  Dopplers negative for DVT -antiplatelet therapy: N/A 3. Pain Management:Lidoderm patch,Voltaren gelQID  Controlled on 1/7  Monitor increased exertion 4. Mood:Provide emotional support -antipsychotic agents: N/A 5. Neuropsych: This patientis not fully capable of making decisions on herown behalf. 6. Skin/Wound Care:Routine skin checks  Pressure relief to left proximal arm, healing stage II  Left lateral breast with unstagable ulcer-discussed with daughter, unclear chronicity of ulcer- ultrasound ordered to rule out breast CA prior to debridement-discussed with radiologist, plan to do limited exam today given limitations at [facility].  If  negative will consider further work-up/debridement. 7. Fluids/Electrolytes/Nutrition:Routine in and outs  -encourage appropriate PO 8. Left mildly displaced fracture of the radial head and neck.  -Conservative care.  -Continue to follow Dr. Lorin Mercy as an outpatient.   Patient may use platform walker with elbow splint at 90 degrees per Ortho 9. Hyperlipidemia.  Lipitor on hold due to elevated LFTs, will hopefully be able to resume next week 10.  Transaminitis.  -recent RUQ [place] suggestive of background liver disease  LFTs elevated, but improving on 1/7, labs ordered for Monday 11. HTN: ARB held d/t AKI  Slightly labile on 1/7  Cozaar 25 started on 1/5  Monitor with increased mobility 12. AKI:: Resolved  Creatinine 0.84 on 1/4 13.  Hypoalbuminemia  Supplement initiated on 1/4 14. Leukocytosis  WBCs 10.6 on 1/4, labs ordered for Monday  Afebrile  Continue to monitor  LOS: 4 days A FACE TO FACE EVALUATION WAS PERFORMED  Edwina Grossberg Lorie Phenix 08/30/2020, 8:22 AM

## 2020-08-30 NOTE — Progress Notes (Signed)
Speech Language Pathology Daily Session Note  Patient Details  Name: [name] MRN: [id number] Date of Birth: 1918/08/15  Today's Date: 08/30/2020 SLP Individual Time: 1002-1059 SLP Individual Time Calculation (min): 57 min  Short Term Goals: Week 1: SLP Short Term Goal 1 (Week 1): Pt will recall 2 safety precautions and/or new therapy techniques with Mod A verbal/visual cues for use of aids/strategies. SLP Short Term Goal 2 (Week 1): Pt will consistently orient X4 across 3 sessions with Min A for use of aids/strategies. SLP Short Term Goal 3 (Week 1): Pt will follow basic 1 and 2 step directions with 80% accuracy provided Min A verbal/visual cues. SLP Short Term Goal 4 (Week 1): Pt will demonstrate ability to problem solve basic familiar functional situations with Min A verbal/visual cueing. SLP Short Term Goal 5 (Week 1): Pt will demonstrate ability to use word-finding strategies in functional conversation and other targeted language tasks with Supervision A cues.  Skilled Therapeutic Interventions: Skilled ST services focused on cognitive skills. Pt demonstrated intermittent language of confusion and word finding deficits when attempting to express wants/needs. Pt denies difficulty with word finding, but does agree with increased confusion. Pt was only orientated to self and month. SLP posted visual aids in room, in which pt was able to refer to for orientation of place, year and date. Pt required total A orientation of day of the week. SLP facilitated basic problem solving skills in picture card sequencing task, Pt was able to sequence 2 step picture cards with mod A verbal cues but required total A to sequence 3 step cards. Pt demonstrated appropriate problem solving to locate and utilize call bell upon request. Pt was left with chair alarm set and call bell within reach. Recommend to continue skilled ST services.      Pain Pain Assessment Pain Score: 0-No pain Faces Pain Scale: No  hurt  Therapy/Group: Individual Therapy  Colsen Modi  [facility] 08/30/2020, 12:46 PM

## 2020-08-30 NOTE — Telephone Encounter (Signed)
I called, only reach voicemail with no returned calls. Per notes in the chart, PT is aware of need for platform walker with elbow splint at 90 degrees. Will wait for return call.

## 2020-08-30 NOTE — Progress Notes (Signed)
Physical Therapy Session Note  Patient Details  Name: [name] MRN: [id number] Date of Birth: 08/06/1918  Today's Date: 08/30/2020 PT Individual Time: 0800-0859 PT Individual Time Calculation (min): 59 min   Short Term Goals: Week 1:  PT Short Term Goal 1 (Week 1): Pt will complete bed mobility with no more than minA without bed features PT Short Term Goal 2 (Week 1): Pt will consistently complete bed<>chair transfers with minA and LRAD PT Short Term Goal 3 (Week 1): Pt will ambulate 97ft with minA and LRAD  Skilled Therapeutic Interventions/Progress Updates:     Pt received supine in bed, awake and agreeable to PT session. She does not recall therapist from yesterday. She reports no pain but poor nights rest. She is unable to recall her LUE weight bearing restrictions and has great difficutly complying with this during our session. Pt requesting to go to the bathroom and brush her teeth at start of session. Performed supine<>sit with modA with HOB slightly elevated. She has great difficulty scooting her hips forward at EOB, requiring modA for forward translation. Sit<>stand with modA with cues for forward weight shift as she throws herself posteriorly quite a bite. Ambulated with modA via face-to-face hand held assist to the bathroom, ~61ft. She shows shuffling gait pattern with HIGH levels of anxiety and HIGH levels of fearfulness, often repeating "where am I" and "Help me." She is difficult to calm while performing functional mobility tasks. She required modA for balance while she lowered briefs in standing and required modA for controlled lowering to 3-1 BSC over toilet. Pt was continent of bladder. Sit<>stand with modA from 3-1 BSC and able to perform frontal pericare with modA for standing balance. She ambulated back to her w/c with similar technique as listed above, requiring modA. She becomes even more fearful when navigating bathroom threshold and her balance is greatly affected.  Required maxA for lower body and upper body dressing while she sat in her w/c. She would attempt to put her arm through the head hole of her shirt and required assist with threading LUE due to soft cast. Wheeled her sinkside and she performed sit<>stand with minA but using BUE's to pull from sink for rising. She maintained standing for >5 minutes with CGA with BUE support on sink while she brushed her teeth with RUE. She washed her face with setupA while seated in w/c. W/c transport to ortho gym with [place] for time management. Performed gait training of ~70ft with PFRW and min/modA for RW management and steadying, continues to show shuffling gait pattern and high anxiety levels. After a seated rest, she then ambulated 61ft (!!) with minA and PFRW, anxiety levels seemed to improve and confidence improved as well. She did show the shuffling gait but was able to correct this slightly with cues. She was transported the remaining distance back to her room where she remained seated in w/c with safety belt alarm on and her needs within reach.  Therapy Documentation Precautions:  Precautions Precautions: Fall Precaution Comments: left radial head fracture with wrist cock up: NWB through wrist and hand Restrictions Weight Bearing Restrictions: Yes LUE Weight Bearing: Non weight bearing Other Position/Activity Restrictions: L radial head fx  Therapy/Group: Individual Therapy  Ilyssa Grennan P Toddrick Sanna PT 08/30/2020, 7:31 AM

## 2020-08-30 NOTE — Plan of Care (Signed)
  Problem: Consults Goal: RH BRAIN INJURY PATIENT EDUCATION Description: Description: See Patient Education module for eduction specifics Outcome: Progressing   Problem: RH BOWEL ELIMINATION Goal: RH STG MANAGE BOWEL WITH ASSISTANCE Description: STG Manage Bowel with Mod I Assistance. Outcome: Progressing Goal: RH STG MANAGE BOWEL W/MEDICATION W/ASSISTANCE Description: STG Manage Bowel with Medication with Mod I Assistance. Outcome: Progressing   Problem: RH BLADDER ELIMINATION Goal: RH STG MANAGE BLADDER WITH ASSISTANCE Description: STG Manage Bladder With Mod I Assistance Outcome: Progressing   Problem: RH SAFETY Goal: RH STG ADHERE TO SAFETY PRECAUTIONS W/ASSISTANCE/DEVICE Description: STG Adhere to Safety Precautions With Mod I Assistance/Device. Outcome: Progressing Goal: RH STG DECREASED RISK OF FALL WITH ASSISTANCE Description: STG Decreased Risk of Fall With Mod I Assistance. Outcome: Progressing   Problem: RH COGNITION-NURSING Goal: RH STG USES MEMORY AIDS/STRATEGIES W/ASSIST TO PROBLEM SOLVE Description: STG Uses Memory Aids/Strategies With Assistance to Problem Solve. Outcome: Progressing   Problem: RH PAIN MANAGEMENT Goal: RH STG PAIN MANAGED AT OR BELOW PT'S PAIN GOAL Description: <2 Outcome: Progressing   Problem: RH KNOWLEDGE DEFICIT BRAIN INJURY Goal: RH STG INCREASE KNOWLEDGE OF SELF CARE AFTER BRAIN INJURY Outcome: Progressing

## 2020-08-30 NOTE — Progress Notes (Signed)
Physical Therapy Session Note  Patient Details  Name: [name] MRN: [id number] Date of Birth: April 07, 1918  Today's Date: 08/30/2020 PT Individual Time: 1120-1205 PT Individual Time Calculation (min): 45 min   Short Term Goals: Week 1:  PT Short Term Goal 1 (Week 1): Pt will complete bed mobility with no more than minA without bed features PT Short Term Goal 2 (Week 1): Pt will consistently complete bed<>chair transfers with minA and LRAD PT Short Term Goal 3 (Week 1): Pt will ambulate 72ft with minA and LRAD Week 2:    Week 3:     Skilled Therapeutic Interventions/Progress Updates:    PAIN "oh my knees" /pt c/o bilat knee pain w/Sit to stand but improved once standing  Did not quantify.  Pt initially oob in wc.  Confused regarding situation/place.  States "I don't think I can go out today" but did not object to transport.  Repeatedly asks "where am I?".   Pt Sit to stand from wc w/mod assist, very slow transition, pt very fearful/anxious. C/o knee pain w/transition. Pt returned herself to sitting.  Rested 1 min then repeated Sit to stand w/therapist in front of pt and guiding pt forward. Gait 47ft w/min assist therapist guiding from front and pt holding to therapist for her improved sense of security, shuffling gait, continued reassurance and reorientation by therapist.  Turn/sit to wc w/min assist as above. Pt complains "I am freezing", covered w/multiple blankets during rest . Repeated Sit to stand but pt states "I just don't think I can walk again" and returns to sitting.  Rests several min as above. Sit to stand from wc and performed dynamic task of transferring horseshoes from tray table in front of pt to tray table to R side x 5.  Rested in sitting then returned objects from r to in front of pt .   At end of session, pt transported back to room.  Pt left Pt left oob in wc w/alarm belt set and needs in reach, covered w/multiple blankets for warmth.   Therapy  Documentation Precautions:  Precautions Precautions: Fall Precaution Comments: left radial head fracture with wrist cock up: NWB through wrist and hand Restrictions Weight Bearing Restrictions: Yes LUE Weight Bearing: Non weight bearing Other Position/Activity Restrictions: L radial head fx    Therapy/Group: Individual Therapy  Callie Fielding, [place] 08/30/2020, 12:24 PM

## 2020-08-31 DIAGNOSIS — S065X2S Traumatic subdural hemorrhage with loss of consciousness of 31 minutes to 59 minutes, sequela: Secondary | ICD-10-CM

## 2020-08-31 NOTE — Plan of Care (Signed)
  Problem: Consults Goal: RH BRAIN INJURY PATIENT EDUCATION Description: Description: See Patient Education module for eduction specifics Outcome: Progressing   Problem: RH BOWEL ELIMINATION Goal: RH STG MANAGE BOWEL WITH ASSISTANCE Description: STG Manage Bowel with Mod I Assistance. Outcome: Progressing Goal: RH STG MANAGE BOWEL W/MEDICATION W/ASSISTANCE Description: STG Manage Bowel with Medication with Mod I Assistance. Outcome: Progressing   Problem: RH BLADDER ELIMINATION Goal: RH STG MANAGE BLADDER WITH ASSISTANCE Description: STG Manage Bladder With Mod I Assistance Outcome: Progressing   Problem: RH SAFETY Goal: RH STG ADHERE TO SAFETY PRECAUTIONS W/ASSISTANCE/DEVICE Description: STG Adhere to Safety Precautions With Mod I Assistance/Device. Outcome: Progressing Goal: RH STG DECREASED RISK OF FALL WITH ASSISTANCE Description: STG Decreased Risk of Fall With Mod I Assistance. Outcome: Progressing   Problem: RH COGNITION-NURSING Goal: RH STG USES MEMORY AIDS/STRATEGIES W/ASSIST TO PROBLEM SOLVE Description: STG Uses Memory Aids/Strategies With Assistance to Problem Solve. Outcome: Progressing   Problem: RH PAIN MANAGEMENT Goal: RH STG PAIN MANAGED AT OR BELOW PT'S PAIN GOAL Description: <2 Outcome: Progressing   Problem: RH KNOWLEDGE DEFICIT BRAIN INJURY Goal: RH STG INCREASE KNOWLEDGE OF SELF CARE AFTER BRAIN INJURY Outcome: Progressing   

## 2020-08-31 NOTE — Progress Notes (Signed)
[facility] PHYSICAL MEDICINE & REHABILITATION PROGRESS NOTE  Subjective/Complaints: C/o of back pain. Sleep was fair. BP elevated this morning  ROS: Limited due to cognitive/behavioral    Objective: Vital Signs: Blood pressure (!) 176/78, pulse 80, temperature 99 F (37.2 C), resp. rate 20, height 5' (1.524 m), weight 70.8 kg, SpO2 95 %. [place] CHEST SOFT TISSUE  Result Date: 08/30/2020 CLINICAL DATA:  87 year old female with OUTER LEFT breast wound. Evaluate for underlying mass. Patient with subdural hematoma of from recent fall and with dementia. EXAM: ULTRASOUND OF THE LEFT BREAST COMPARISON:  Previous exam(s). FINDINGS: Targeted ultrasound of the OUTER LEFT breast underlying the patient's wound is performed, showing no sonographic abnormalities in this area. No solid or cystic mass, distortion or abnormal shadowing identified. IMPRESSION: No sonographic abnormality in the OUTER LEFT breast, underlying the patient's superficial wound. No sonographic evidence of malignancy in this area. Electronically Signed   By: Margarette Canada M.D.   On: 08/30/2020 15:04   No results for input(s): WBC, HGB, HCT, PLT in the last 72 hours. Recent Labs    08/30/20 0458  NA 141  K 3.7  CL 106  CO2 25  GLUCOSE 124*  BUN 22  CREATININE 0.85  CALCIUM 8.4*    Intake/Output Summary (Last 24 hours) at 08/31/2020 [id number] Last data filed at 08/31/2020 [id number] Gross per 24 hour  Intake 478 ml  Output --  Net 478 ml     Pressure Injury 08/26/20 Breast Lateral;Left;Lower Unstageable - Full thickness tissue loss in which the base of the injury is covered by slough (yellow, tan, gray, green or brown) and/or eschar (tan, brown or black) in the wound bed. (Active)  08/26/20 1430  Location: Breast  Location Orientation: Lateral;Left;Lower  Staging: Unstageable - Full thickness tissue loss in which the base of the injury is covered by slough (yellow, tan, gray, green or brown) and/or eschar (tan, brown or black) in the  wound bed.  Wound Description (Comments):   Present on Admission: Yes     Pressure Injury 08/26/20 Arm Anterior;Left;Proximal;Upper Stage 2 -  Partial thickness loss of dermis presenting as a shallow open injury with a red, pink wound bed without slough. (Active)  08/26/20 1430  Location: Arm  Location Orientation: Anterior;Left;Proximal;Upper  Staging: Stage 2 -  Partial thickness loss of dermis presenting as a shallow open injury with a red, pink wound bed without slough.  Wound Description (Comments):   Present on Admission: Yes    Physical Exam: BP (!) 176/78   Pulse 80   Temp 99 F (37.2 C)   Resp 20   Ht 5' (1.524 m)   Wt 70.8 kg   SpO2 95%   BMI 30.47 kg/m  Constitutional: No distress . Vital signs reviewed. HEENT: EOMI, oral membranes moist Neck: supple Cardiovascular: RRR without murmur. No JVD    Respiratory/Chest: CTA Bilaterally without wheezes or rales. Normal effort    GI/Abdomen: BS +, non-tender, non-distended Ext: no clubbing, cyanosis, or edema Psych: flat, slowed Skin: Warm and dry.   Unstageable ulcer to left breast Small stage II ulcer left proximal medial arm--stable Musc: Left elbow with tenderness.  No edema and other extremities.   Left upper extremity in splint. Neuro: fairly alert HOH Motor: Grossly 4/5 throughout, left upper extremity with limitations due to pain, unchanged  Assessment/Plan: 1. Functional deficits which require 3+ hours per day of interdisciplinary therapy in a comprehensive inpatient rehab setting.  Physiatrist is providing close team supervision and 24  hour management of active medical problems listed below.  Physiatrist and rehab team continue to assess barriers to discharge/monitor patient progress toward functional and medical goals   Care Tool:  Bathing    Body parts bathed by patient: Chest,Abdomen,Face   Body parts bathed by helper: Right arm,Left arm,Chest,Abdomen,Front perineal area,Buttocks,Right upper  leg,Left upper leg,Right lower leg,Left lower leg     Bathing assist Assist Level: Total Assistance - Patient < 25%     Upper Body Dressing/Undressing Upper body dressing   What is the patient wearing?: Pull over shirt    Upper body assist Assist Level: Maximal Assistance - Patient 25 - 49%    Lower Body Dressing/Undressing Lower body dressing      What is the patient wearing?: Pants,Incontinence brief     Lower body assist Assist for lower body dressing: Total Assistance - Patient < 25%     Toileting Toileting    Toileting assist Assist for toileting: Maximal Assistance - Patient 25 - 49%     Transfers Chair/bed transfer  Transfers assist     Chair/bed transfer assist level: Maximal Assistance - Patient 25 - 49%     Locomotion Ambulation   Ambulation assist      Assist level: 2 helpers Assistive device: Hand held assist Max distance: 30   Walk 10 feet activity   Assist     Assist level: 2 helpers Assistive device: Hand held assist   Walk 50 feet activity   Assist Walk 50 feet with 2 turns activity did not occur: Safety/medical concerns         Walk 150 feet activity   Assist Walk 150 feet activity did not occur: Safety/medical concerns         Walk 10 feet on uneven surface  activity   Assist Walk 10 feet on uneven surfaces activity did not occur: Safety/medical concerns         Wheelchair     Assist Will patient use wheelchair at discharge?: No             Wheelchair 50 feet with 2 turns activity    Assist            Wheelchair 150 feet activity     Assist           Medical Problem List and Plan: 1.Decreased functional build altered mental statussecondary to traumatic SDH/SAH/acute metabolic encephalopathy  Continue CIR 2. Antithrombotics: -DVT/anticoagulation:SCDs  Dopplers negative for DVT -antiplatelet therapy: N/A 3. Pain Management:Lidoderm patch,Voltaren  gelQID  1/8 fair control  -has kpad ordered but hasn't received yet  Monitor increased exertion 4. Mood:Provide emotional support -antipsychotic agents: N/A 5. Neuropsych: This patientis not fully capable of making decisions on herown behalf. 6. Skin/Wound Care:Routine skin checks  Pressure relief to left proximal arm, healing stage II  Left lateral breast with unstagable ulcer-discussed with daughter, unclear chronicity of ulcer- ultrasound ordered to rule out breast CA prior to debridement-discussed with radiologist.   1/8 limited U/S done yesterday. No evidence of maligancy   -defer debridement to primary team this week 7. Fluids/Electrolytes/Nutrition:Routine in and outs  -encourage appropriate PO 8. Left mildly displaced fracture of the radial head and neck.  -Conservative care.  -Continue to follow Dr. Lorin Mercy as an outpatient.   Patient may use platform walker with elbow splint at 90 degrees per Ortho 9. Hyperlipidemia.  Lipitor on hold due to elevated LFTs, will hopefully be able to resume next week 10.  Transaminitis.  -recent RUQ [place] suggestive  of background liver disease  LFTs elevated, but improving on 1/7, labs ordered for Monday 11. HTN: ARB held d/t AKI  Slightly labile on 1/7  Cozaar 25 started on 1/5  1/8 SBP elevation this morning. Cozaar given early. Consider increasing to 50mg  as Cr has been stable 12. AKI:: Resolved  Creatinine 0.84 on 1/4 13.  Hypoalbuminemia  Supplement initiated on 1/4 14. Leukocytosis  WBCs 10.6 on 1/4, labs ordered for Monday  Afebrile  Continue to monitor  LOS: 5 days A FACE TO FACE EVALUATION WAS PERFORMED  Meredith Staggers 08/31/2020, 9:21 AM

## 2020-08-31 NOTE — Progress Notes (Signed)
RN notified Dr.Swartz of BP elevated. Dr.Swartz ordered RN to give losartan early. Pt in no acute distress. RN will continue to monitor.

## 2020-09-01 ENCOUNTER — Inpatient Hospital Stay (HOSPITAL_COMMUNITY): Payer: Medicare Other

## 2020-09-01 ENCOUNTER — Inpatient Hospital Stay (HOSPITAL_COMMUNITY): Payer: Medicare Other | Admitting: Speech Pathology

## 2020-09-01 NOTE — Progress Notes (Signed)
Physical Therapy Session Note  Patient Details  Name: [name] MRN: [id number] Date of Birth: [birth year]  Today's Date: 09/01/2020 PT Individual Time: 1450-1615 PT Individual Time Calculation (min): 85 min   Short Term Goals: Week 1:  PT Short Term Goal 1 (Week 1): Pt will complete bed mobility with no more than minA without bed features PT Short Term Goal 2 (Week 1): Pt will consistently complete bed<>chair transfers with minA and LRAD PT Short Term Goal 3 (Week 1): Pt will ambulate 89ft with minA and LRAD  Skilled Therapeutic Interventions/Progress Updates:  Pt dozing in wc, and obviously uncomfortable as she woke up.  Unable to rate pain, but c/o bil knees with flexion and wt bearing.  PT doffed non slip socks and donned shoes on pt .  Pt reported that she needed to use toilet.  +2 stand pivot to [facility] over toilet, with use of R hand and non wt bearing LUE.  Pt unable and scared attempting to use LPFRW.  Pt continent of urine on toilet.  Pt attempted peri care in sitting, but needed help reaching between LEs.  Pt assisted with clothing and brief intermittently with R hand.  Use of calendar for orienting to day and date, with max assist.  Pt with poor technique for scooting forward and shifting wt for sit> stand.  Therapeutic activity in sitting in w/c to encourage forward wt shift: picking up a card with R hand and flexing trunk forward to match onto vertical board in front of her, with mod cues for matching, 8 cards.  Sit> stand, pulling up with R hand on railing, and with mod assist, pt stood x 1.5 minutes during R hand activity folding and counting washcloths.    Bil LE strengthening for knee extensors, holding bil knees in extension x 1 minute x 3 while PT pushed wc without footrests on wc.  Bil knee flexion/extension strengthening, propelling w/c using feet x 25' with supervision.    Gait training over level tile, using R hallway railing x 10' with mod assist.  Max cues for upright  posture and forward gaze.  Pt fearful,  but so pleased that she had ambulated.    At end of session, pt did not want to go back to bed but asked to get into recliner.  Stand Jennette Kettle transfer without AD, mod assist.  At end of session, pt called PT by name.   Seat belt alarm set and needs left at hand.  Back reclined and bil feet elevated in recliner.     Therapy Documentation Precautions:  Precautions Precautions: Fall Precaution Comments: left radial head fracture with wrist cock up: NWB through wrist and hand Restrictions Weight Bearing Restrictions: Yes LUE Weight Bearing: Non weight bearing LLE Weight Bearing: Non weight bearing Other Position/Activity Restrictions: L radial head fx       Therapy/Group: Individual Therapy  Kelly Eisler 09/01/2020, 4:27 PM

## 2020-09-01 NOTE — Progress Notes (Signed)
Occupational Therapy Session Note  Patient Details  Name: [name] MRN: [id number] Date of Birth: [birth year]  Today's Date: 09/01/2020 OT Individual Time: 0174-9449 OT Individual Time Calculation (min): 60 min    Short Term Goals: Week 1:  OT Short Term Goal 1 (Week 1): patient will complete bed mobility and tolerate unsupported sitting with CS with good carryover on NWB left wrist/hand OT Short Term Goal 2 (Week 1): patient will complete UB bathing/dressing with mod a OT Short Term Goal 3 (Week 1): patient will complete toileting with mod A OT Short Term Goal 4 (Week 1): patient will complete SPT with min A  Skilled Therapeutic Interventions/Progress Updates:    Pt received sitting in w/c, having just finished toileting with nursing staff. Pt c/o B knee pain, un-rated but willing to get washed up at the sink. Pt was oriented to time, situation, and self- not to place. Pt completed oral care at the sink with set up assist. Pt completed UB bathing with max A, pt often perseverative on LUE splint/ace wrap and "when is this coming off". Pt required max A to don shirt, especially over bulky L wrapping. Increased time required for redirection to task, and extended/lengthy story telling. LB dressing/bathing skipped since pt had just completed toileting tasks with nursing staff and had no new pants available to don. Pt was taken to the therapy gym via w/c. She completed blocked practice of sit <> stands with PFRW. She required mod cueing for technique and initiation, with heavy cueing for anxiety reduction. Pt was able to remain standing for 30 sec- 1 minute each trial. Pt returned to her w/c and was left sitting up with all needs met, chair alarm set.   Therapy Documentation Precautions:  Precautions Precautions: Fall Precaution Comments: left radial head fracture with wrist cock up: NWB through wrist and hand Restrictions Weight Bearing Restrictions: Yes LUE Weight Bearing: Non weight  bearing LLE Weight Bearing: Non weight bearing Other Position/Activity Restrictions: L radial head fx   Therapy/Group: Individual Therapy  Curtis Sites 09/01/2020, 8:33 AM

## 2020-09-01 NOTE — Progress Notes (Signed)
Speech Language Pathology Daily Session Note  Patient Details  Name: [name] MRN: [id number] Date of Birth: 08-11-1918  Today's Date: 09/01/2020 SLP Individual Time: 5427-0623 SLP Individual Time Calculation (min): 40 min  Short Term Goals: Week 1: SLP Short Term Goal 1 (Week 1): Pt will recall 2 safety precautions and/or new therapy techniques with Mod A verbal/visual cues for use of aids/strategies. SLP Short Term Goal 2 (Week 1): Pt will consistently orient X4 across 3 sessions with Min A for use of aids/strategies. SLP Short Term Goal 3 (Week 1): Pt will follow basic 1 and 2 step directions with 80% accuracy provided Min A verbal/visual cues. SLP Short Term Goal 4 (Week 1): Pt will demonstrate ability to problem solve basic familiar functional situations with Min A verbal/visual cueing. SLP Short Term Goal 5 (Week 1): Pt will demonstrate ability to use word-finding strategies in functional conversation and other targeted language tasks with Supervision A cues.  Skilled Therapeutic Interventions: Pt was seen for skilled ST targeting cognitive-linguistic goals. Pt OX4 today, with Min A verbal cues for problem solving use of her calendar to orient to time. She also completed another basic calendar task, with fluctuating Min-Mod A verbal and visual cues for problem solving when interpreting information from the calendar to answer questions. Min increasing to Mod A verbal cues were required for redirection during session, due to pt's internal distractibility. Near end of session, divergent naming task used to target primarily attention but also word-finding. She named 7 different types of fruit within 15 minutes, with Mod A semantic cues for word finding and also sustained attention. Pt left sitting in wheelchair with alarm set and needs within reach. Continue per current plan of care.       Pain Pain Assessment Pain Scale: Faces Faces Pain Scale: Hurts a little bit Pain Location:  Knee Pain Orientation: Right;Left Pain Descriptors / Indicators: Sore Pain Onset: On-going Pain Intervention(s): Distraction;Rest Multiple Pain Sites: No  Therapy/Group: Individual Therapy  Arbutus Leas 09/01/2020, 7:27 AM

## 2020-09-01 NOTE — Plan of Care (Signed)
  Problem: Consults Goal: RH BRAIN INJURY PATIENT EDUCATION Description: Description: See Patient Education module for eduction specifics Outcome: Progressing   Problem: RH BOWEL ELIMINATION Goal: RH STG MANAGE BOWEL WITH ASSISTANCE Description: STG Manage Bowel with Mod I Assistance. Outcome: Progressing Goal: RH STG MANAGE BOWEL W/MEDICATION W/ASSISTANCE Description: STG Manage Bowel with Medication with Mod I Assistance. Outcome: Progressing   Problem: RH BLADDER ELIMINATION Goal: RH STG MANAGE BLADDER WITH ASSISTANCE Description: STG Manage Bladder With Mod I Assistance Outcome: Progressing   Problem: RH SAFETY Goal: RH STG ADHERE TO SAFETY PRECAUTIONS W/ASSISTANCE/DEVICE Description: STG Adhere to Safety Precautions With Mod I Assistance/Device. Outcome: Progressing Goal: RH STG DECREASED RISK OF FALL WITH ASSISTANCE Description: STG Decreased Risk of Fall With Mod I Assistance. Outcome: Progressing   Problem: RH COGNITION-NURSING Goal: RH STG USES MEMORY AIDS/STRATEGIES W/ASSIST TO PROBLEM SOLVE Description: STG Uses Memory Aids/Strategies With Assistance to Problem Solve. Outcome: Progressing   Problem: RH PAIN MANAGEMENT Goal: RH STG PAIN MANAGED AT OR BELOW PT'S PAIN GOAL Description: <2 Outcome: Progressing   Problem: RH KNOWLEDGE DEFICIT BRAIN INJURY Goal: RH STG INCREASE KNOWLEDGE OF SELF CARE AFTER BRAIN INJURY Outcome: Progressing   

## 2020-09-01 NOTE — Progress Notes (Signed)
[facility] PHYSICAL MEDICINE & REHABILITATION PROGRESS NOTE  Subjective/Complaints: Pt getting up on Steady with NT to go to bathroom. Having general aches and pains  ROS: Patient denies fever, rash, sore throat, blurred vision, nausea, vomiting, diarrhea, cough, shortness of breath or chest pain, headache, or mood change.    Objective: Vital Signs: Blood pressure (!) 156/61, pulse 73, temperature 98.1 F (36.7 C), resp. rate 15, height 5' (1.524 m), weight 70.8 kg, SpO2 95 %. [place] CHEST SOFT TISSUE  Result Date: 08/30/2020 CLINICAL DATA:  87 year old female with OUTER LEFT breast wound. Evaluate for underlying mass. Patient with subdural hematoma of from recent fall and with dementia. EXAM: ULTRASOUND OF THE LEFT BREAST COMPARISON:  Previous exam(s). FINDINGS: Targeted ultrasound of the OUTER LEFT breast underlying the patient's wound is performed, showing no sonographic abnormalities in this area. No solid or cystic mass, distortion or abnormal shadowing identified. IMPRESSION: No sonographic abnormality in the OUTER LEFT breast, underlying the patient's superficial wound. No sonographic evidence of malignancy in this area. Electronically Signed   By: Margarette Canada M.D.   On: 08/30/2020 15:04   No results for input(s): WBC, HGB, HCT, PLT in the last 72 hours. Recent Labs    08/30/20 0458  NA 141  K 3.7  CL 106  CO2 25  GLUCOSE 124*  BUN 22  CREATININE 0.85  CALCIUM 8.4*    Intake/Output Summary (Last 24 hours) at 09/01/2020 0902 Last data filed at 09/01/2020 0815 Gross per 24 hour  Intake 700 ml  Output --  Net 700 ml     Pressure Injury 08/26/20 Breast Lateral;Left;Lower Unstageable - Full thickness tissue loss in which the base of the injury is covered by slough (yellow, tan, gray, green or brown) and/or eschar (tan, brown or black) in the wound bed. (Active)  08/26/20 1430  Location: Breast  Location Orientation: Lateral;Left;Lower  Staging: Unstageable - Full thickness tissue  loss in which the base of the injury is covered by slough (yellow, tan, gray, green or brown) and/or eschar (tan, brown or black) in the wound bed.  Wound Description (Comments):   Present on Admission: Yes     Pressure Injury 08/26/20 Arm Anterior;Left;Proximal;Upper Stage 2 -  Partial thickness loss of dermis presenting as a shallow open injury with a red, pink wound bed without slough. (Active)  08/26/20 1430  Location: Arm  Location Orientation: Anterior;Left;Proximal;Upper  Staging: Stage 2 -  Partial thickness loss of dermis presenting as a shallow open injury with a red, pink wound bed without slough.  Wound Description (Comments):   Present on Admission: Yes    Physical Exam: BP (!) 156/61 (BP Location: Right Arm)   Pulse 73   Temp 98.1 F (36.7 C)   Resp 15   Ht 5' (1.524 m)   Wt 70.8 kg   SpO2 95%   BMI 30.47 kg/m  Constitutional: No distress . Vital signs reviewed. HEENT: EOMI, oral membranes moist Neck: supple Cardiovascular: RRR without murmur. No JVD    Respiratory/Chest: CTA Bilaterally without wheezes or rales. Normal effort    GI/Abdomen: BS +, non-tender, non-distended Ext: no clubbing, cyanosis, or edema Psych: flat, delayed Skin: Warm and dry.   Unstageable ulcer to left breast Small stage II ulcer left proximal medial arm Musc: left arm/elbow tender, wrapped in splint.   Neuro: fairly alert HOH Motor: Grossly 4/5 throughout, left upper extremity with limitations due to pain, unchanged  Assessment/Plan: 1. Functional deficits which require 3+ hours per day  of interdisciplinary therapy in a comprehensive inpatient rehab setting.  Physiatrist is providing close team supervision and 24 hour management of active medical problems listed below.  Physiatrist and rehab team continue to assess barriers to discharge/monitor patient progress toward functional and medical goals   Care Tool:  Bathing    Body parts bathed by patient: Chest,Abdomen,Face    Body parts bathed by helper: Right arm,Left arm,Chest,Abdomen,Front perineal area,Buttocks,Right upper leg,Left upper leg,Right lower leg,Left lower leg     Bathing assist Assist Level: Total Assistance - Patient < 25%     Upper Body Dressing/Undressing Upper body dressing   What is the patient wearing?: Pull over shirt    Upper body assist Assist Level: Maximal Assistance - Patient 25 - 49%    Lower Body Dressing/Undressing Lower body dressing      What is the patient wearing?: Pants,Incontinence brief     Lower body assist Assist for lower body dressing: Total Assistance - Patient < 25%     Toileting Toileting    Toileting assist Assist for toileting: Maximal Assistance - Patient 25 - 49%     Transfers Chair/bed transfer  Transfers assist     Chair/bed transfer assist level: Maximal Assistance - Patient 25 - 49%     Locomotion Ambulation   Ambulation assist      Assist level: 2 helpers Assistive device: Hand held assist Max distance: 30   Walk 10 feet activity   Assist     Assist level: 2 helpers Assistive device: Hand held assist   Walk 50 feet activity   Assist Walk 50 feet with 2 turns activity did not occur: Safety/medical concerns         Walk 150 feet activity   Assist Walk 150 feet activity did not occur: Safety/medical concerns         Walk 10 feet on uneven surface  activity   Assist Walk 10 feet on uneven surfaces activity did not occur: Safety/medical concerns         Wheelchair     Assist Will patient use wheelchair at discharge?: No             Wheelchair 50 feet with 2 turns activity    Assist            Wheelchair 150 feet activity     Assist           Medical Problem List and Plan: 1.Decreased functional build altered mental statussecondary to traumatic SDH/SAH/acute metabolic encephalopathy  Continue CIR 2. Antithrombotics: -DVT/anticoagulation:SCDs  Dopplers  negative for DVT -antiplatelet therapy: N/A 3. Pain Management:Lidoderm patch,Voltaren gelQID  1/8-9 fair control  -kpad requested  Monitor increased exertion 4. Mood:Provide emotional support -antipsychotic agents: N/A 5. Neuropsych: This patientis not fully capable of making decisions on herown behalf. 6. Skin/Wound Care:Routine skin checks  Pressure relief to left proximal arm, healing stage II  Left lateral breast with unstagable ulcer-discussed with daughter, unclear chronicity of ulcer- ultrasound ordered to rule out breast CA prior to debridement-discussed with radiologist.   1/8 limited U/S done 1/7. No evidence of maligancy   -defer debridement to primary team this week 7. Fluids/Electrolytes/Nutrition:Routine in and outs  -encourage appropriate PO 8. Left mildly displaced fracture of the radial head and neck.  -Conservative care.  -Continue to follow Dr. Lorin Mercy as an outpatient.   Patient may use platform walker with elbow splint at 90 degrees per Ortho 9. Hyperlipidemia.  Lipitor on hold due to elevated LFTs, will hopefully be  able to resume next week 10.  Transaminitis.  -recent RUQ [place] suggestive of background liver disease  LFTs elevated, but improving on 1/7, labs ordered for Monday 11. HTN: ARB held d/t AKI  Slightly labile on 1/7  Cozaar 25 started on 1/5  1/9 mild lability this weekend. Consider increasing to 50mg  if necessary as Cr has been stable 12. AKI:: Resolved  Creatinine 0.84 on 1/4 13.  Hypoalbuminemia  Supplement initiated on 1/4 14. Leukocytosis  WBCs 10.6 on 1/4, labs ordered for Monday  Afebrile  Continue to monitor  LOS: 6 days A FACE TO FACE EVALUATION WAS PERFORMED  Meredith Staggers 09/01/2020, 9:02 AM

## 2020-09-02 ENCOUNTER — Inpatient Hospital Stay (HOSPITAL_COMMUNITY): Payer: Medicare Other

## 2020-09-02 ENCOUNTER — Inpatient Hospital Stay (HOSPITAL_COMMUNITY): Payer: Medicare Other | Admitting: Occupational Therapy

## 2020-09-02 DIAGNOSIS — R52 Pain, unspecified: Secondary | ICD-10-CM

## 2020-09-02 DIAGNOSIS — L8995 Pressure ulcer of unspecified site, unstageable: Secondary | ICD-10-CM

## 2020-09-02 LAB — COMPREHENSIVE METABOLIC PANEL
ALT: 176 U/L — ABNORMAL HIGH (ref 0–44)
AST: 106 U/L — ABNORMAL HIGH (ref 15–41)
Albumin: 2.2 g/dL — ABNORMAL LOW (ref 3.5–5.0)
Alkaline Phosphatase: 118 U/L (ref 38–126)
Anion gap: 12 (ref 5–15)
BUN: 26 mg/dL — ABNORMAL HIGH (ref 8–23)
CO2: 24 mmol/L (ref 22–32)
Calcium: 8.3 mg/dL — ABNORMAL LOW (ref 8.9–10.3)
Chloride: 103 mmol/L (ref 98–111)
Creatinine, Ser: 0.8 mg/dL (ref 0.44–1.00)
GFR, Estimated: >60 mL/min (ref >60)
Glucose, Bld: 116 mg/dL — ABNORMAL HIGH (ref 70–99)
Potassium: 3.6 mmol/L (ref 3.5–5.1)
Sodium: 139 mmol/L (ref 135–145)
Total Bilirubin: 1 mg/dL (ref 0.3–1.2)
Total Protein: 5.7 g/dL — ABNORMAL LOW (ref 6.5–8.1)

## 2020-09-02 LAB — CBC WITH DIFFERENTIAL/PLATELET
Abs Immature Granulocytes: 0.08 10*3/uL — ABNORMAL HIGH (ref 0.00–0.07)
Basophils Absolute: 0 10*3/uL (ref 0.0–0.1)
Basophils Relative: 0 %
Eosinophils Absolute: 0.1 10*3/uL (ref 0.0–0.5)
Eosinophils Relative: 1 %
HCT: 30.4 % — ABNORMAL LOW (ref 36.0–46.0)
Hemoglobin: 10.1 g/dL — ABNORMAL LOW (ref 12.0–15.0)
Immature Granulocytes: 1 %
Lymphocytes Relative: 7 %
Lymphs Abs: 0.7 10*3/uL (ref 0.7–4.0)
MCH: 28.5 pg (ref 26.0–34.0)
MCHC: 33.2 g/dL (ref 30.0–36.0)
MCV: 85.6 fL (ref 80.0–100.0)
Monocytes Absolute: 1.3 10*3/uL — ABNORMAL HIGH (ref 0.1–1.0)
Monocytes Relative: 12 %
Neutro Abs: 8.3 10*3/uL — ABNORMAL HIGH (ref 1.7–7.7)
Neutrophils Relative %: 79 %
Platelets: 269 10*3/uL (ref 150–400)
RBC: 3.55 MIL/uL — ABNORMAL LOW (ref 3.87–5.11)
RDW: 14.1 % (ref 11.5–15.5)
WBC: 10.4 10*3/uL (ref 4.0–10.5)
nRBC: 0 % (ref 0.0–0.2)

## 2020-09-02 MED ORDER — LOSARTAN POTASSIUM 25 MG PO TABS
25.0000 mg | ORAL_TABLET | Freq: Once | ORAL | Status: AC
  Administered 2020-09-02: 25 mg via ORAL
  Filled 2020-09-02: qty 1

## 2020-09-02 MED ORDER — LOSARTAN POTASSIUM 50 MG PO TABS
50.0000 mg | ORAL_TABLET | Freq: Every day | ORAL | Status: DC
  Administered 2020-09-03 – 2020-09-06 (×4): 50 mg via ORAL
  Filled 2020-09-02 (×4): qty 1

## 2020-09-02 NOTE — Progress Notes (Signed)
Occupational Therapy Session Note  Patient Details  Name: [name] MRN: [id number] Date of Birth: 1917/09/22  Today's Date: 09/02/2020 OT Individual Time: 0930-1030 OT Individual Time Calculation (min): 60 min  and Today's Date: 09/02/2020 OT Missed Time: 15 Minutes Missed Time Reason: Patient fatigue   Short Term Goals: Week 1:  OT Short Term Goal 1 (Week 1): patient will complete bed mobility and tolerate unsupported sitting with CS with good carryover on NWB left wrist/hand OT Short Term Goal 2 (Week 1): patient will complete UB bathing/dressing with mod a OT Short Term Goal 3 (Week 1): patient will complete toileting with mod A OT Short Term Goal 4 (Week 1): patient will complete SPT with min A  Skilled Therapeutic Interventions/Progress Updates:    Treatment session with focus on self-care retraining, sit > stand, and recall of WB precautions during functional tasks and mobility.  Pt received in w/c dozing, but easily aroused to voice.  Pt obviously uncomfortable as she adjusted and woke up.  Pt oriented to self only this session, requiring cues to recall place and situation.  Pt agreeable to getting dressed, but reports already bathing this am.  Pt required max assist for UB dressing due to generalized pain in BUE and due to donning shirt over bulky LUE wrapping.  Pt continuing to c/o of being cold and fatigued, but agreeable to attempting therapy session in gym.  Engaged in sit > stand at high-low table with therapist supporting LUE to maintain NWB during transitional movements.  Pt able to complete sit > stand mod-min assist with blocked practice.  Pt tolerated standing ~20-30 seconds before requesting to return to sitting. Pt continues to perseverate on being cold and requested to return to room.  Pt agreeable to remaining upright in w/c.  Therapist supplied warm blanket and left pt upright with seat belt alarm on, all needs in reach, and warm blanket over body.  Therapy  Documentation Precautions:  Precautions Precautions: Fall Precaution Comments: left radial head fracture with wrist cock up: NWB through wrist and hand Restrictions Weight Bearing Restrictions: Yes LUE Weight Bearing: Non weight bearing LLE Weight Bearing: Non weight bearing Other Position/Activity Restrictions: L radial head fx General: General OT Amount of Missed Time: 15 Minutes Pain: Pain Assessment Pain Scale: 0-10 Pain Score: 0-No pain   Therapy/Group: Individual Therapy  Simonne Come 09/02/2020, 12:08 PM

## 2020-09-02 NOTE — Progress Notes (Signed)
[facility] PHYSICAL MEDICINE & REHABILITATION PROGRESS NOTE  Subjective/Complaints: Patient seen sitting up in her chair working with therapy this morning.  She states she slept well overnight.  She is appreciative, but notes that she is confused and states that she would like to go home.  Showed patient breast ulcer, patient is certain that it was not present prior to admission.  ROS: Denies CP, SOB, N/V/D  Objective: Vital Signs: Blood pressure (!) 156/62, pulse 75, temperature 98.5 F (36.9 C), temperature source Oral, resp. rate 20, height 5' (1.524 m), weight 70.8 kg, SpO2 97 %. No results found. Recent Labs    09/02/20 0630  WBC 10.4  HGB 10.1*  HCT 30.4*  PLT 269   Recent Labs    09/02/20 0630  NA 139  K 3.6  CL 103  CO2 24  GLUCOSE 116*  BUN 26*  CREATININE 0.80  CALCIUM 8.3*    Intake/Output Summary (Last 24 hours) at 09/02/2020 1304 Last data filed at 09/02/2020 0900 Gross per 24 hour  Intake 340 ml  Output -  Net 340 ml     Pressure Injury 08/26/20 Breast Lateral;Left;Lower Unstageable - Full thickness tissue loss in which the base of the injury is covered by slough (yellow, tan, gray, green or brown) and/or eschar (tan, brown or black) in the wound bed. (Active)  08/26/20 1430  Location: Breast  Location Orientation: Lateral;Left;Lower  Staging: Unstageable - Full thickness tissue loss in which the base of the injury is covered by slough (yellow, tan, gray, green or brown) and/or eschar (tan, brown or black) in the wound bed.  Wound Description (Comments):   Present on Admission: Yes     Pressure Injury 08/26/20 Arm Anterior;Left;Proximal;Upper Stage 2 -  Partial thickness loss of dermis presenting as a shallow open injury with a red, pink wound bed without slough. (Active)  08/26/20 1430  Location: Arm  Location Orientation: Anterior;Left;Proximal;Upper  Staging: Stage 2 -  Partial thickness loss of dermis presenting as a shallow open injury with a  red, pink wound bed without slough.  Wound Description (Comments):   Present on Admission: Yes    Physical Exam: BP (!) 156/62 (BP Location: Right Arm)   Pulse 75   Temp 98.5 F (36.9 C) (Oral)   Resp 20   Ht 5' (1.524 m)   Wt 70.8 kg   SpO2 97%   BMI 30.47 kg/m  Constitutional: No distress . Vital signs reviewed. HENT: Normocephalic.  Atraumatic. Eyes: EOMI. No discharge. Cardiovascular: No JVD.  RRR. Respiratory: Normal effort.  No stridor.  Bilateral clear to auscultation. GI: Non-distended.  BS +. Skin: Warm and dry.   Unstageable ulcer to left breast, appears to be improving in size Small stage II ulcer left proximal medial arm Psych: Delayed.  Confused. Musc: Left upper extremity in splint with tenderness  Neuro: Alert and oriented x3 HOH Motor: Grossly 4/5 throughout, left upper extremity with limitations due to pain, stable  Assessment/Plan: 1. Functional deficits which require 3+ hours per day of interdisciplinary therapy in a comprehensive inpatient rehab setting.  Physiatrist is providing close team supervision and 24 hour management of active medical problems listed below.  Physiatrist and rehab team continue to assess barriers to discharge/monitor patient progress toward functional and medical goals   Care Tool:  Bathing    Body parts bathed by patient: Chest,Abdomen,Face   Body parts bathed by helper: Right arm,Left arm,Chest,Abdomen,Front perineal area,Buttocks,Right upper leg,Left upper leg,Right lower leg,Left lower leg  Bathing assist Assist Level: Total Assistance - Patient < 25%     Upper Body Dressing/Undressing Upper body dressing   What is the patient wearing?: Pull over shirt    Upper body assist Assist Level: Maximal Assistance - Patient 25 - 49%    Lower Body Dressing/Undressing Lower body dressing      What is the patient wearing?: Pants,Incontinence brief     Lower body assist Assist for lower body dressing: Total  Assistance - Patient < 25%     Toileting Toileting    Toileting assist Assist for toileting: Maximal Assistance - Patient 25 - 49%     Transfers Chair/bed transfer  Transfers assist     Chair/bed transfer assist level: Moderate Assistance - Patient 50 - 74%     Locomotion Ambulation   Ambulation assist      Assist level: Moderate Assistance - Patient 50 - 74% Assistive device: Other (comment) (hallway railing R) Max distance: 10   Walk 10 feet activity   Assist     Assist level: Moderate Assistance - Patient - 50 - 74% Assistive device: Other (comment) (hallway railing R)   Walk 50 feet activity   Assist Walk 50 feet with 2 turns activity did not occur: Safety/medical concerns         Walk 150 feet activity   Assist Walk 150 feet activity did not occur: Safety/medical concerns         Walk 10 feet on uneven surface  activity   Assist Walk 10 feet on uneven surfaces activity did not occur: Safety/medical concerns         Wheelchair     Assist Will patient use wheelchair at discharge?: No             Wheelchair 50 feet with 2 turns activity    Assist            Wheelchair 150 feet activity     Assist           Medical Problem List and Plan: 1.Decreased functional build altered mental statussecondary to traumatic SDH/SAH/acute metabolic encephalopathy  Continue CIR 2. Antithrombotics: -DVT/anticoagulation:SCDs  Dopplers negative for DVT -antiplatelet therapy: N/A 3. Pain Management:Lidoderm patch,Voltaren gelQID  K pad  Relatively controlled on 1/10  Monitor increased exertion 4. Mood:Provide emotional support -antipsychotic agents: N/A 5. Neuropsych: This patientis not fully capable of making decisions on herown behalf. 6. Skin/Wound Care:Routine skin checks  Pressure relief to left proximal arm, healing stage II  Left lateral breast with unstagable ulcer-discussed  with daughter, unclear chronicity of ulcer- ultrasound ordered to rule out breast CA prior to debridement-discussed with radiologist.    Limited ultrasound negative for underlying malignancy    Plan to debride this week 7. Fluids/Electrolytes/Nutrition:Routine in and outs  -encourage appropriate PO 8. Left mildly displaced fracture of the radial head and neck.  -Conservative care.  -Continue to follow Dr. Lorin Mercy as an outpatient.   Patient may use platform walker with elbow splint at 90 degrees per Ortho 9. Hyperlipidemia.  Lipitor on hold due to elevated LFTs, will hopefully be able to resume this week 10.  Transaminitis.  -recent RUQ [place] suggestive of background liver disease  LFTs elevated, but improving on 1/10 11. HTN:   Cozaar 25 started on 1/5, increased to 50 on 1/10  Elevated on 1/10 12. AKI: Resolved  Creatinine 0.84 on 1/4 13.  Hypoalbuminemia  Supplement initiated on 1/4 14. Leukocytosis  WBCs 10.4 on 1/10  Afebrile  Continue to  monitor  LOS: 7 days A FACE TO FACE EVALUATION WAS PERFORMED  Ankit Lorie Phenix 09/02/2020, 1:04 PM

## 2020-09-02 NOTE — Progress Notes (Signed)
Speech Language Pathology Daily Session Note  Patient Details  Name: [name] MRN: [id number] Date of Birth: Nov 25, 1917  Today's Date: 09/02/2020 SLP Individual Time: 1445-1530 SLP Individual Time Calculation (min): 45 min  Short Term Goals: Week 1: SLP Short Term Goal 1 (Week 1): Pt will recall 2 safety precautions and/or new therapy techniques with Mod A verbal/visual cues for use of aids/strategies. SLP Short Term Goal 2 (Week 1): Pt will consistently orient X4 across 3 sessions with Min A for use of aids/strategies. SLP Short Term Goal 3 (Week 1): Pt will follow basic 1 and 2 step directions with 80% accuracy provided Min A verbal/visual cues. SLP Short Term Goal 4 (Week 1): Pt will demonstrate ability to problem solve basic familiar functional situations with Min A verbal/visual cueing. SLP Short Term Goal 5 (Week 1): Pt will demonstrate ability to use word-finding strategies in functional conversation and other targeted language tasks with Supervision A cues.  Skilled Therapeutic Interventions:Skilled ST services focused on cognitive skills. Pt was orientated to self only, referring to visual aids orientated to place x2 occasions and only time with when presented  with visual aids and pointing at items. SLP facilitated sustained attention and basic problem solving skills sorting cards by colors and then shapes. Pt was able to sort cards by colors in a field of 6 with min A verbal cues for error awareness and by 2 shapes with mod A verbal cues. Pt demonstrated sustained attention in 5-8 minute intervals with min A verbal cues. Pt continued to demonstrated confusion periodiccly stating " I don't know what's happening", in which SLP provided explanation and emotional support. Pt was left in room with call bell within reach and chair alarm set. SLP recommends to continue skilled services.     Pain Pain Assessment Pain Score: 0-No pain  Therapy/Group: Individual Therapy  Averil Digman   [facility] 09/02/2020, 3:56 PM

## 2020-09-02 NOTE — Progress Notes (Signed)
Physical Therapy Session Note  Patient Details  Name: [name] MRN: [id number] Date of Birth: 03/31/1918  Today's Date: 09/02/2020 PT Individual Time: 8185-6314 PT Individual Time Calculation (min): 54 min   Short Term Goals: Week 1:  PT Short Term Goal 1 (Week 1): Pt will complete bed mobility with no more than minA without bed features PT Short Term Goal 2 (Week 1): Pt will consistently complete bed<>chair transfers with minA and LRAD PT Short Term Goal 3 (Week 1): Pt will ambulate 29ft with minA and LRAD  Skilled Therapeutic Interventions/Progress Updates:    Pt received sitting up in bed, agreeable to PT session. She does not recall me as her therapist from last week. She is oriented to self, situation, and year but not exact date. She continues to show high anxiety levels during any functional mobility task and works herself up quite easily despite max cues for calming and redirection. Donned pants with maxA while she lay supine in bed, totalA for her tennis shoes. Supine<>sit with modA but required maxA for forward scooting at EOB. Cues for complying with NWB LUE but limited ability to recall or even know that she had a injury to her arm. Required maxA for sit<>stand from lowered EOB height with no AD and modA for balance while performing stand<>pivot to w/c. Pt requesting need to void once in her chair, provided her with PFRW and she required mod/maxA for sit<>stand from w/c level to [place] and she ambulated with min/modA to bathroom. She required maxA for lowering pants while standing and modA for standing balance due to her posterior bias. Pt was continent of bladder. Sit<>stand with modA from 3-1 BSC height and required maxA for raising pants over hips, ambulating back to her w/c with face-to-face technique instead of PFRW, requiring modA. Wheeled sinkside as pt was requesting to brush her teeth. RN present for morning medications and MD arriving shortly thereafter for morning rounds.  Sit<>stand with modA to sink but she would pull herself up to standing and would lean quite heavily on the sink while she brushed her teeth. With what time was remaining, focused on gait training with PFRW where she ambulated ~41ft with minA and chair follow for safety. Cues for increasing bilateral step length/height and erect posture. Moderate unsteadiness noted in standing or any functional gait. She remained seated in w/c at end of session with safety belt alarm on and her needs within reach, made comfortable.   Therapy Documentation Precautions:  Precautions Precautions: Fall Precaution Comments: left radial head fracture with wrist cock up: NWB through wrist and hand Restrictions Weight Bearing Restrictions: Yes LUE Weight Bearing: Non weight bearing LLE Weight Bearing: Non weight bearing Other Position/Activity Restrictions: L radial head fx  Therapy/Group: Individual Therapy  Virginio Isidore P Lady Wisham PT 09/02/2020, 7:32 AM

## 2020-09-03 ENCOUNTER — Inpatient Hospital Stay (HOSPITAL_COMMUNITY): Payer: Medicare Other

## 2020-09-03 DIAGNOSIS — D62 Acute posthemorrhagic anemia: Secondary | ICD-10-CM

## 2020-09-03 DIAGNOSIS — S065X2D Traumatic subdural hemorrhage with loss of consciousness of 31 minutes to 59 minutes, subsequent encounter: Secondary | ICD-10-CM

## 2020-09-03 NOTE — Progress Notes (Signed)
[place] PHYSICAL MEDICINE & REHABILITATION PROGRESS NOTE  Subjective/Complaints: Patient seen sitting up in bed this morning with nurse tech attempting to eat breakfast.  She states she slept well overnight.  She notes right elbow pain.  ROS: + Right elbow pain.  Denies CP, SOB, N/V/D  Objective: Vital Signs: Blood pressure (!) 171/83, pulse (!) 57, temperature 97.6 F (36.4 C), temperature source Oral, resp. rate 14, height 5' (1.524 m), weight 70.8 kg, SpO2 97 %. No results found. Recent Labs    09/02/20 0630  WBC 10.4  HGB 10.1*  HCT 30.4*  PLT 269   Recent Labs    09/02/20 0630  NA 139  K 3.6  CL 103  CO2 24  GLUCOSE 116*  BUN 26*  CREATININE 0.80  CALCIUM 8.3*    Intake/Output Summary (Last 24 hours) at 09/03/2020 7412 Last data filed at 09/02/2020 2134 Gross per 24 hour  Intake 620 ml  Output -  Net 620 ml     Pressure Injury 08/26/20 Breast Lateral;Left;Lower Unstageable - Full thickness tissue loss in which the base of the injury is covered by slough (yellow, tan, gray, green or brown) and/or eschar (tan, brown or black) in the wound bed. (Active)  08/26/20 1430  Location: Breast  Location Orientation: Lateral;Left;Lower  Staging: Unstageable - Full thickness tissue loss in which the base of the injury is covered by slough (yellow, tan, gray, green or brown) and/or eschar (tan, brown or black) in the wound bed.  Wound Description (Comments):   Present on Admission: Yes     Pressure Injury 08/26/20 Arm Anterior;Left;Proximal;Upper Stage 2 -  Partial thickness loss of dermis presenting as a shallow open injury with a red, pink wound bed without slough. (Active)  08/26/20 1430  Location: Arm  Location Orientation: Anterior;Left;Proximal;Upper  Staging: Stage 2 -  Partial thickness loss of dermis presenting as a shallow open injury with a red, pink wound bed without slough.  Wound Description (Comments):   Present on Admission: Yes    Physical Exam: BP  (!) 171/83 (BP Location: Left Arm)   Pulse (!) 57   Temp 97.6 F (36.4 C) (Oral)   Resp 14   Ht 5' (1.524 m)   Wt 70.8 kg   SpO2 97%   BMI 30.47 kg/m  Constitutional: No distress . Vital signs reviewed. HENT: Normocephalic.  Atraumatic. Eyes: EOMI. No discharge. Cardiovascular: No JVD.  RRR. Respiratory: Normal effort.  No stridor.  Bilateral clear to auscultation. GI: Non-distended.  BS +. Skin: Warm and dry.   Unstageable ulcer to left breast Small stage II ulcer to left proximal medial arm Psych: Slowed.  Confused. Musc: Left upper extremity with splint with tenderness Right lateral elbow tenderness Neuro: Alert HOH Motor: Grossly 4/5 throughout, left upper extremity with limitations due to pain, unchanged  Assessment/Plan: 1. Functional deficits which require 3+ hours per day of interdisciplinary therapy in a comprehensive inpatient rehab setting.  Physiatrist is providing close team supervision and 24 hour management of active medical problems listed below.  Physiatrist and rehab team continue to assess barriers to discharge/monitor patient progress toward functional and medical goals   Care Tool:  Bathing    Body parts bathed by patient: Chest,Abdomen,Face   Body parts bathed by helper: Right arm,Left arm,Chest,Abdomen,Front perineal area,Buttocks,Right upper leg,Left upper leg,Right lower leg,Left lower leg     Bathing assist Assist Level: Total Assistance - Patient < 25%     Upper Body Dressing/Undressing Upper body dressing   What  is the patient wearing?: Pull over shirt    Upper body assist Assist Level: Maximal Assistance - Patient 25 - 49%    Lower Body Dressing/Undressing Lower body dressing      What is the patient wearing?: Pants,Incontinence brief     Lower body assist Assist for lower body dressing: Total Assistance - Patient < 25%     Toileting Toileting    Toileting assist Assist for toileting: Maximal Assistance - Patient 25 - 49%      Transfers Chair/bed transfer  Transfers assist     Chair/bed transfer assist level: Moderate Assistance - Patient 50 - 74%     Locomotion Ambulation   Ambulation assist      Assist level: Moderate Assistance - Patient 50 - 74% Assistive device: Other (comment) (hallway railing R) Max distance: 10   Walk 10 feet activity   Assist     Assist level: Moderate Assistance - Patient - 50 - 74% Assistive device: Other (comment) (hallway railing R)   Walk 50 feet activity   Assist Walk 50 feet with 2 turns activity did not occur: Safety/medical concerns         Walk 150 feet activity   Assist Walk 150 feet activity did not occur: Safety/medical concerns         Walk 10 feet on uneven surface  activity   Assist Walk 10 feet on uneven surfaces activity did not occur: Safety/medical concerns         Wheelchair     Assist Will patient use wheelchair at discharge?: No             Wheelchair 50 feet with 2 turns activity    Assist            Wheelchair 150 feet activity     Assist           Medical Problem List and Plan: 1.Decreased functional build altered mental statussecondary to traumatic SDH/SAH/acute metabolic encephalopathy  Continue CIR 2. Antithrombotics: -DVT/anticoagulation:SCDs  Dopplers negative for DVT -antiplatelet therapy: N/A 3. Pain Management:Lidoderm patch  Voltaren gelQID added to right elbow  K pad  Monitor increased exertion 4. Mood:Provide emotional support -antipsychotic agents: N/A 5. Neuropsych: This patientis not fully capable of making decisions on herown behalf. 6. Skin/Wound Care:Routine skin checks  Pressure relief to left proximal arm, healing stage II  Left lateral breast with unstagable ulcer-discussed with daughter, unclear chronicity of ulcer- ultrasound ordered to rule out breast CA prior to debridement-discussed with radiologist.    Limited  ultrasound negative for underlying malignancy    Plan to debride this week 7. Fluids/Electrolytes/Nutrition:Routine in and outs  -encourage appropriate PO 8. Left mildly displaced fracture of the radial head and neck.  -Conservative care.  -Continue to follow Dr. Ophelia Charter as an outpatient.   Patient may use platform walker with elbow splint at 90 degrees per Ortho 9. Hyperlipidemia.  Lipitor on hold due to elevated LFTs, will hopefully be able to resume later this week 10.  Transaminitis.  -recent RUQ [place] suggestive of background liver disease  LFTs elevated, but improving on 1/10 11. HTN:   Cozaar 25 started on 1/5, increased to 50 on 1/10  Labile on 1/11, monitor for trend 12. AKI: Resolved  Creatinine 0.84 on 1/4 13.  Hypoalbuminemia  Supplement initiated on 1/4 14. Leukocytosis: Resolved  WBCs 10.4 on 1/10  Afebrile  Continue to monitor 15.  Acute blood loss anemia  Hemoglobin 10.1 on 1/10  Continue to monitor  LOS: 8 days A FACE TO FACE EVALUATION WAS PERFORMED  [name] [name] 09/03/2020, 8:28 AM

## 2020-09-03 NOTE — Progress Notes (Signed)
Speech Language Pathology Weekly Progress and Session Note  Patient Details  Name: [name] MRN: [id number] Date of Birth: 1917-12-21  Beginning of progress report period: August 28, 2020 End of progress report period: September 03, 2020  Today's Date: 09/03/2020 SLP Individual Time: 1402-1500 SLP Individual Time Calculation (min): 58 min  Short Term Goals: Week 1: SLP Short Term Goal 1 (Week 1): Pt will recall 2 safety precautions and/or new therapy techniques with Mod A verbal/visual cues for use of aids/strategies. SLP Short Term Goal 1 - Progress (Week 1): Met SLP Short Term Goal 2 (Week 1): Pt will consistently orient X4 across 3 sessions with Min A for use of aids/strategies. SLP Short Term Goal 2 - Progress (Week 1): Not met SLP Short Term Goal 3 (Week 1): Pt will follow basic 1 and 2 step directions with 80% accuracy provided Min A verbal/visual cues. SLP Short Term Goal 3 - Progress (Week 1): Met SLP Short Term Goal 4 (Week 1): Pt will demonstrate ability to problem solve basic familiar functional situations with Min A verbal/visual cueing. SLP Short Term Goal 4 - Progress (Week 1): Not met SLP Short Term Goal 5 (Week 1): Pt will demonstrate ability to use word-finding strategies in functional conversation and other targeted language tasks with Supervision A cues. SLP Short Term Goal 5 - Progress (Week 1): Not met    New Short Term Goals: Week 2: SLP Short Term Goal 1 (Week 2): STG=LTG due to short ELOS  Weekly Progress Updates: Pt met 2 out 5 goals this reporting period due to fuculating cognitive skills, however most improvement noted at the end of the reporting period. Pt demonstrates progress utilizing visual aid for orientation, following basic commands, basic problem solving, recalling safety precautions and word finding in conversation. Pt continues to demonstrate language of confusion, increase in clarity has been noted. Although pt did not meet all STG, SLP will wait  to adjust LTG if need to ensure consistent progress. Pt would continue to benefit from skilled ST services in order to maximize functional independence and reduce burden of care, requiring 24 hour supervision at discharge with continued skilled ST services.       Intensity: Minumum of 1-2 x/day, 30 to 90 minutes Frequency: 3 to 5 out of 7 days Duration/Length of Stay: 1/18 Treatment/Interventions: Cognitive remediation/compensation;Cueing hierarchy;Functional tasks;Patient/family education;Speech/Language facilitation;Therapeutic Activities   Daily Session  Skilled Therapeutic Interventions: Skilled ST services focused on communication skills. Pt demonstrated increase clarity in today's session much improved from previous sessions. Pt was orientated x4 with supervision A verbal cues for use of visual aid. Pt demonstrated recall of recent PT events "finding jello in the kitchen" and demonstrated ability to remain on topic (recalling events that lead to hospitalization) for 4 exchanges with min A verbal cues. Pt demonstrated word finding deficits in conversation, requiring min A verbal cues. Pt requested to use the bathroom and demonstrated ability to recall transfer steps with supervision A verbal cues. Pt required min A to transfer from [facility] to toilet, voided and transferred back to chair. Pt was left in room with call bell within reach and chair alarm set. SLP recommends to continue skilled services.     General    Pain Pain Assessment Pain Score: 0-No pain  Therapy/Group: Individual Therapy  Dare Spillman  [facility] 09/03/2020, 3:22 PM

## 2020-09-03 NOTE — Progress Notes (Signed)
Occupational Therapy Session Note  Patient Details  Name: [name] MRN: [id number] Date of Birth: 03-Jul-1918  Today's Date: 09/03/2020 OT Individual Time: 9842-1031 OT Individual Time Calculation (min): 39 min    Short Term Goals: Week 1:  OT Short Term Goal 1 (Week 1): patient will complete bed mobility and tolerate unsupported sitting with CS with good carryover on NWB left wrist/hand OT Short Term Goal 2 (Week 1): patient will complete UB bathing/dressing with mod a OT Short Term Goal 3 (Week 1): patient will complete toileting with mod A OT Short Term Goal 4 (Week 1): patient will complete SPT with min A  Skilled Therapeutic Interventions/Progress Updates:    Pt received supine with c/o R elbow pain, reporting RN has applied muscle rub and it is helping slightly. Pt wanting to use the bathroom. She completed bed mobility with mod A to the EOB. Pt stood with platform RW and long L arm splint with mod A. She completed ambulatory transfer into the bathroom with min A, assist required for RW management/turning and cueing for safety. Pt required mod A for toileting tasks after voiding urine. Pt completed ambulatory transfer back to w/c with min A, increased pain in her R elbow. Pt completed oral care at the sink with set up assist. Pt c/o L armpit pain and upon inspection, slight skin breakdown. Extra padding reinforcement was added to this area and clarification re use at rest will be clarified with MD/PA and info relayed to primary OT. Pt was left sitting up with all needs met, chair alarm set.   Therapy Documentation Precautions:  Precautions Precautions: Fall Precaution Comments: left radial head fracture with wrist cock up: NWB through wrist and hand Restrictions Weight Bearing Restrictions: Yes LUE Weight Bearing: Non weight bearing LLE Weight Bearing: Non weight bearing Other Position/Activity Restrictions: L radial head fx Therapy/Group: Individual Therapy  Curtis Sites 09/03/2020, 6:45 AM

## 2020-09-03 NOTE — Progress Notes (Signed)
Physical Therapy Session Note  Patient Details  Name: [name] MRN: [id number] Date of Birth: 07-25-1918  Today's Date: 09/03/2020 PT Individual Time: 1000-1059 + 1300-1343 PT Individual Time Calculation (min): 59 min  + 43 min  Short Term Goals: Week 1:  PT Short Term Goal 1 (Week 1): Pt will complete bed mobility with no more than minA without bed features PT Short Term Goal 2 (Week 1): Pt will consistently complete bed<>chair transfers with minA and LRAD PT Short Term Goal 3 (Week 1): Pt will ambulate 65ft with minA and LRAD  Skilled Therapeutic Interventions/Progress Updates:     1st session: Pt received sitting in w/c, daughter at bedside, pt agreeable to PT session. She reports R elbow pain, which she contributes to arthritis from weather changes. MD is aware and has provided her with arthritic topical ointment for pain management. Rest breaks and emotional support also provided for relief. Spoke with daughter regarding patient's current status with functional mobility and barriers to DC. Daughter assisted with providing words of encouragement and motivation for patient to improve participation with therapies, which appeared to help. She was transported in her w/c to main therapy gym for time management. Performed BLE LAQ with 2.5# ankle weight for purposes of "warming up" tolerance to functional mobility. She then performed gait training, 35ft + 175ft (!) with minA and PFRW. Gait deficits include shuffling steps, forward flexed trunk, decreased gait speed, and difficulty managing PFRW with turns. She continues to be quite anxious and fearful regarding all functional mobility however this was better than yesterday. Frequent rest breaks needed during session for both calming and fatigue. She was returned to the room and remained seated in w/c at end of session with safety belt alarm on and needs within reach. Daughter at bedside and she was pleased with progress.    2nd session: Pt  received sitting in w/c, agreeable to therapy session. She reports "medium" R elbow pain, denies request for medication intervention. Distraction and mobility provided for relief. W/c transport to ADL apartment room with totalA for time management. Instructed her to locate 3 items in the ADL Kitchen. She was able to recall 2/3 items when she ambulated to the kitchen but required max cues for finding items. She required minA and PFRW for ambulating and continues to be quite fearful during ambulating or functional mobility. She tolerated standing for >10 minutes while searching for items in kitchen but unfamiliar environment impacting her confidence. She then ambulated from nurses station back to her room, ~135ft, with minA and PFRW. Gait deficits including shuffling steps with forward flexed trunk. No formal LOB but significant unsteadiness noted, especially with turns and environmental distractions. She remained seated in w/c with safety belt alarm on at end of session with her needs within reach. She was very thankful for therapist intervention. She was also questioning her DC plan and is concerned for the level of assist she will require at home, uncertain if daughter can provide enough. Will continue to work on safe DC plan.   Therapy Documentation Precautions:  Precautions Precautions: Fall Precaution Comments: left radial head fracture with wrist cock up: NWB through wrist and hand Restrictions Weight Bearing Restrictions: Yes LUE Weight Bearing: Non weight bearing LLE Weight Bearing: Non weight bearing Other Position/Activity Restrictions: L radial head fx  Therapy/Group: Individual Therapy  Brelynn Wheller P Gerre Ranum PT 09/03/2020, 7:38 AM

## 2020-09-04 ENCOUNTER — Inpatient Hospital Stay (HOSPITAL_COMMUNITY): Payer: Medicare Other | Admitting: Occupational Therapy

## 2020-09-04 ENCOUNTER — Inpatient Hospital Stay (HOSPITAL_COMMUNITY): Payer: Medicare Other

## 2020-09-04 NOTE — Progress Notes (Signed)
[place] PHYSICAL MEDICINE & REHABILITATION PROGRESS NOTE  Subjective/Complaints: Patient seen sitting up in her bed, working with therapy this morning.  She states she slept well overnight.  She becomes tearful and states she wants to go home.  Discussed wounds with therapies.  ROS: Denies CP, SOB, N/V/D  Objective: Vital Signs: Blood pressure (!) 131/58, pulse 75, temperature 98.3 F (36.8 C), temperature source Oral, resp. rate 16, height 5' (1.524 m), weight 70.8 kg, SpO2 98 %. No results found. Recent Labs    09/02/20 0630  WBC 10.4  HGB 10.1*  HCT 30.4*  PLT 269   Recent Labs    09/02/20 0630  NA 139  K 3.6  CL 103  CO2 24  GLUCOSE 116*  BUN 26*  CREATININE 0.80  CALCIUM 8.3*    Intake/Output Summary (Last 24 hours) at 09/04/2020 0913 Last data filed at 09/03/2020 1902 Gross per 24 hour  Intake 480 ml  Output -  Net 480 ml     Pressure Injury 08/26/20 Breast Lateral;Left;Lower Unstageable - Full thickness tissue loss in which the base of the injury is covered by slough (yellow, tan, gray, green or brown) and/or eschar (tan, brown or black) in the wound bed. (Active)  08/26/20 1430  Location: Breast  Location Orientation: Lateral;Left;Lower  Staging: Unstageable - Full thickness tissue loss in which the base of the injury is covered by slough (yellow, tan, gray, green or brown) and/or eschar (tan, brown or black) in the wound bed.  Wound Description (Comments):   Present on Admission: Yes     Pressure Injury 08/26/20 Arm Anterior;Left;Proximal;Upper Stage 2 -  Partial thickness loss of dermis presenting as a shallow open injury with a red, pink wound bed without slough. (Active)  08/26/20 1430  Location: Arm  Location Orientation: Anterior;Left;Proximal;Upper  Staging: Stage 2 -  Partial thickness loss of dermis presenting as a shallow open injury with a red, pink wound bed without slough.  Wound Description (Comments):   Present on Admission: Yes     Physical Exam: BP (!) 131/58 (BP Location: Right Arm)   Pulse 75   Temp 98.3 F (36.8 C) (Oral)   Resp 16   Ht 5' (1.524 m)   Wt 70.8 kg   SpO2 98%   BMI 30.47 kg/m  Constitutional: No distress . Vital signs reviewed. HENT: Normocephalic.  Atraumatic. Eyes: EOMI. No discharge. Cardiovascular: No JVD.  RRR. Respiratory: Normal effort.  No stridor.  Bilateral clear to auscultation. GI: Non-distended.  BS +. Skin: Warm and dry.   Unstageable ulcer to left breast Small stage II ulcer to left proximal medial arm Stage I pressure ulcer to the left axilla Psych: Normal mood.  Normal behavior. Musc: Left upper extremity in splint with tenderness. Neuro: Alert HOH Motor: Grossly 4/5 throughout, left upper extremity with limitations due to pain, unchanged  Assessment/Plan: 1. Functional deficits which require 3+ hours per day of interdisciplinary therapy in a comprehensive inpatient rehab setting.  Physiatrist is providing close team supervision and 24 hour management of active medical problems listed below.  Physiatrist and rehab team continue to assess barriers to discharge/monitor patient progress toward functional and medical goals   Care Tool:  Bathing    Body parts bathed by patient: Chest,Abdomen,Face   Body parts bathed by helper: Right arm,Left arm,Chest,Abdomen,Front perineal area,Buttocks,Right upper leg,Left upper leg,Right lower leg,Left lower leg     Bathing assist Assist Level: Total Assistance - Patient < 25%     Upper Body Dressing/Undressing  Upper body dressing   What is the patient wearing?: Pull over shirt    Upper body assist Assist Level: Maximal Assistance - Patient 25 - 49%    Lower Body Dressing/Undressing Lower body dressing      What is the patient wearing?: Pants,Incontinence brief     Lower body assist Assist for lower body dressing: Total Assistance - Patient < 25%     Toileting Toileting    Toileting assist Assist for  toileting: Maximal Assistance - Patient 25 - 49%     Transfers Chair/bed transfer  Transfers assist     Chair/bed transfer assist level: Moderate Assistance - Patient 50 - 74%     Locomotion Ambulation   Ambulation assist      Assist level: Moderate Assistance - Patient 50 - 74% Assistive device: Other (comment) (hallway railing R) Max distance: 10   Walk 10 feet activity   Assist     Assist level: Moderate Assistance - Patient - 50 - 74% Assistive device: Other (comment) (hallway railing R)   Walk 50 feet activity   Assist Walk 50 feet with 2 turns activity did not occur: Safety/medical concerns         Walk 150 feet activity   Assist Walk 150 feet activity did not occur: Safety/medical concerns         Walk 10 feet on uneven surface  activity   Assist Walk 10 feet on uneven surfaces activity did not occur: Safety/medical concerns         Wheelchair     Assist Will patient use wheelchair at discharge?: No             Wheelchair 50 feet with 2 turns activity    Assist            Wheelchair 150 feet activity     Assist           Medical Problem List and Plan: 1.Decreased functional build altered mental statussecondary to traumatic SDH/SAH/acute metabolic encephalopathy  Continue CIR  Team conference today to discuss current and goals and coordination of care, home and environmental barriers, and discharge planning with nursing, case manager, and therapies. Please see conference note from today as well.  2. Antithrombotics: -DVT/anticoagulation:SCDs  Dopplers negative for DVT -antiplatelet therapy: N/A 3. Pain Management:Lidoderm patch  Voltaren gelQID added to right elbow, improving  K pad  Monitor increased exertion 4. Mood:Provide emotional support -antipsychotic agents: N/A 5. Neuropsych: This patientis not fully capable of making decisions on herown behalf. 6.  Skin/Wound Care:Routine skin checks  Pressure relief to left proximal arm, healing stage II  Left lateral breast with unstagable ulcer-discussed with daughter, unclear chronicity of ulcer- ultrasound ordered to rule out breast CA prior to debridement-discussed with radiologist.    Limited ultrasound negative for underlying malignancy    Plan to debride  Left stage I axillary wound 7. Fluids/Electrolytes/Nutrition:Routine in and outs  -encourage appropriate PO 8. Left mildly displaced fracture of the radial head and neck.  -Conservative care.  -Continue to follow Dr. Lorin Mercy as an outpatient.   Patient may use platform walker with elbow splint at 90 degrees per Ortho-we will follow-up with Ortho regarding ability to remove 9. Hyperlipidemia.  Lipitor on hold due to elevated LFTs, will hopefully be able to resume later this week 10.  Transaminitis.  -recent RUQ [place] suggestive of background liver disease  LFTs elevated, but improving on 1/10 11. HTN:   Cozaar 25 started on 1/5, increased to 50 on  1/10  Relatively controlled on 1/12 12. AKI: Resolved  Creatinine 0.84 on 1/4 13.  Hypoalbuminemia  Supplement initiated on 1/4 14. Leukocytosis: Resolved  WBCs 10.4 on 1/10  Afebrile  Continue to monitor 15.  Acute blood loss anemia  Hemoglobin 10.1 on 1/10  Continue to monitor   LOS: 9 days A FACE TO FACE EVALUATION WAS PERFORMED  Ankit Lorie Phenix 09/04/2020, 9:13 AM

## 2020-09-04 NOTE — Progress Notes (Signed)
Occupational Therapy Session Note  Patient Details  Name: [name] MRN: [id number] Date of Birth: 27-Feb-1918  Today's Date: 09/04/2020 OT Individual Time: 0973-5329 OT Individual Time Calculation (min): 52 min    Short Term Goals: Week 1:  OT Short Term Goal 1 (Week 1): patient will complete bed mobility and tolerate unsupported sitting with CS with good carryover on NWB left wrist/hand OT Short Term Goal 2 (Week 1): patient will complete UB bathing/dressing with mod a OT Short Term Goal 3 (Week 1): patient will complete toileting with mod A OT Short Term Goal 4 (Week 1): patient will complete SPT with min A  Skilled Therapeutic Interventions/Progress Updates:    Pt received in bed, crying and stating :"I dont know where I am!".  Helped pt to reorient and she calmed down. See ADL documentation below for details but overall she needs mod A UB self care, max LB, min with sit to stand with transfers with PFRW. She needs MAX cues to not put weight on L wrist.  Frequent cues to reorient patient as she becomes anxious easily and begins crying. Pt moans frequently as if she is in pain, but states she is not in pain.  Pt needed to toilet at end of session, so transferred to [facility]. Pt did void but wanted to keep sitting to void more.  Therapy session time running over so called her nurse tech to come in and help and take over.   Therapy Documentation Precautions:  Precautions Precautions: Fall Precaution Comments: left radial head fracture with wrist cock up: NWB through wrist and hand Restrictions Weight Bearing Restrictions: No LUE Weight Bearing: Non weight bearing LLE Weight Bearing: Non weight bearing Other Position/Activity Restrictions: L radial head fx  Pain: Pain Assessment Pain Scale: 0-10 Pain Score: 0-No pain ADL: ADL Grooming: Setup Where Assessed-Grooming: Sitting at sink,Wheelchair Upper Body Bathing: Moderate assistance Where Assessed-Upper Body Bathing: Sitting at  sink,Wheelchair Lower Body Bathing: Moderate assistance Where Assessed-Lower Body Bathing: Sitting at sink,Wheelchair Upper Body Dressing: Moderate assistance Where Assessed-Upper Body Dressing: Wheelchair Lower Body Dressing: Maximal assistance Where Assessed-Lower Body Dressing: Wheelchair Toileting: Maximal assistance Where Assessed-Toileting: Bedside Commode Toilet Transfer: Minimal assistance Toilet Transfer Method: Stand pivot Toilet Transfer Equipment: Bedside commode   Therapy/Group: Individual Therapy  [place] 09/04/2020, 12:11 PM

## 2020-09-04 NOTE — Progress Notes (Signed)
Patient assisted up to [facility] x 2 staff, tolerated well,clothing changed, able to assess left arm area,tenderness and sensitivity to arm remains, denies pain has episodes of moaning with activity but states she does not need any medication for pain.Continent x2, per care provided and assisted back to bed, made as comfortable as possible,

## 2020-09-04 NOTE — Progress Notes (Signed)
Occupational Therapy Weekly Progress Note  Patient Details  Name: [name] MRN: [id number] Date of Birth: [birth year]  Beginning of progress report period: August 27, 2020 End of progress report period: September 04, 2020  Today's Date: 09/04/2020 OT Individual Time: 1130-1200 OT Individual Time Calculation (min): 30 min    Patient has met 2 of 4 short term goals.  Pt is making steady gains evidenced by increased independence with UB bathing, toileting, and dressing from max assist to mod assist.  Barriers to progress are impaired memory and problem solving and fear/anxiety of falling.  Pt requires repetitive training for improved carryover of skills and safety precautions due to these barriers.  Pt also requires full time elbow splint LUE and NWB status limits pt's independence with self care and mobility.  Pt requires skilled OT to facilitate pt achieving supervision level for safe discharge home with daughter.    Patient continues to demonstrate the following deficits: muscle weakness, decreased attention, decreased problem solving, decreased safety awareness and decreased memory and decreased standing balance, decreased postural control and decreased balance strategies and therefore will continue to benefit from skilled OT intervention to enhance overall performance with BADL.  Patient progressing toward long term goals..  Continue plan of care.  OT Short Term Goals Week 1:  OT Short Term Goal 1 (Week 1): patient will complete bed mobility and tolerate unsupported sitting with CS with good carryover on NWB left wrist/hand OT Short Term Goal 1 - Progress (Week 1): Progressing toward goal OT Short Term Goal 2 (Week 1): patient will complete UB bathing/dressing with mod a OT Short Term Goal 2 - Progress (Week 1): Met OT Short Term Goal 3 (Week 1): patient will complete toileting with mod A OT Short Term Goal 3 - Progress (Week 1): Met OT Short Term Goal 4 (Week 1): patient will complete  SPT with min A OT Short Term Goal 4 - Progress (Week 1): Progressing toward goal Week 2:  OT Short Term Goal 1 (Week 2): STGs=LTGs due to ELOS  Skilled Therapeutic Interventions/Progress Updates:    Pt sitting up in w/c, no c/o pain, agreeable to OT session.  OT session with focus on functional transfer training, carryover of LUE weightbearing precautions, and improved standing posture at platform walker.  Pt completed blocked practice sit<>stand transfer from w/c<>platform walker with min assist for initial power up to reduce pt need to bear weight through LUE.  Pt required max TCs to reduce weightbearing through LUE during each trial.  Pt completed short distance ambulation with CGA and max intermittent VCs to improve trunk posture.  Noted left ring and small fingers blocked into extension in elbow bundle splint.  Pt at risk for joint contracture left digits, and would benefit from custom fabrication of elbow splint with wrist included.  Pt returned to w/c, call bell in reach, seat belt alarm on.  Therapy Documentation Precautions:  Precautions Precautions: Fall Precaution Comments: left radial head fracture with wrist cock up: NWB through wrist and hand Restrictions Weight Bearing Restrictions: No LUE Weight Bearing: Non weight bearing LLE Weight Bearing: Non weight bearing Other Position/Activity Restrictions: L radial head fx   Therapy/Group: Individual Therapy  Ezekiel Slocumb 09/04/2020, 12:17 PM

## 2020-09-04 NOTE — Progress Notes (Signed)
Physical Therapy Weekly Progress Note  Patient Details  Name: [name] MRN: [id number] Date of Birth: 09-22-1917  Beginning of progress report period: August 27, 2020 End of progress report period: September 04, 2020  Today's Date: 09/04/2020 PT Individual Time: 1000-1100 + -1740-8144 PT Individual Time Calculation (min): 60 min  + 63 min  Patient has met 1 of 3 short term goals. Pt is making slow progress towards her goals. She continues to require modA for bed mobility due to motor apraxia, anxiety, and LUE NWB restrictions. She has been able to perform bed<>chair transfers with minA via HHA however not consistently as she more often requires modA due to her posterior bias, anxiety, and fearfulness of falling. She has shown the ability to ambulate up to ~150-123f with minA and PFRW but gait is quite unsteady with shuffling steps, forward flexed trunk, and decreased gait speed. She also has great difficulty recalling her NWB restrictions in her left arm and requires max cues for compliance with all functional mobility.  Patient continues to demonstrate the following deficits muscle weakness, decreased cardiorespiratoy endurance, motor apraxia, decreased coordination and decreased motor planning, decreased visual acuity, decreased motor planning, decreased initiation, decreased attention, decreased awareness, decreased problem solving, decreased safety awareness, decreased memory and delayed processing and decreased standing balance, decreased postural control, decreased balance strategies and difficulty maintaining precautions and therefore will continue to benefit from skilled PT intervention to increase functional independence with mobility.  Patient progressing toward long term goals. Continue current plan of care and monitor for needs to downgrade goals as appropriate.  PT Short Term Goals Week 1:  PT Short Term Goal 1 (Week 1): Pt will complete bed mobility with no more than minA  without bed features PT Short Term Goal 2 (Week 1): Pt will consistently complete bed<>chair transfers with minA and LRAD PT Short Term Goal 3 (Week 1): Pt will ambulate 543fwith minA and LRAD Week 2:  PT Short Term Goal 1 (Week 2): STG = LTG due to ELOS  Skilled Therapeutic Interventions/Progress Updates:     1st session: Pt received sitting in w/c, agreeable to PT session, she denies pain and recalls me as her therapist. Sit<>stand with minA from w/c height to PFDavidsonShe ambulated >15072f!) from her room to main therapy gym with mostly CGA and PFRW (intermittent minA for turns and RW management).   Performed dynamic standing balance tasks including applying and removing clothes pins to basketball net that was set on her R side, using her R arm, focusing on functional reach outside BOS. She continues to show posterior bias and she did rely quite heavily on the back of her legs for balance/steadying while completing this. While doing this, she performed repeated sit<>stands with minA and no AD from lowered mat table height. She also completed horseshoe toss with RUE and minA guard, while unsupported. She enjoyed this activity and was actually quite coordinated with tossing the horsehose that was ~94f56fay.  Performed gait training ambulating 85ft67f00ft 6fwith mostly CGA (intermittent minA) and PFRW while on level surfaces. She continues to demonstrate shuffling steps with poor foot clearance and forward flexed trunk. Noted decreased in anxiety levels and fearfulness as compared to prior to sessions. Cues throughout for postural awareness, increasing step height, PFRW management, and general pacing/safety awareness. No formal LOB or knee buckling noted but unsteadiness present.  She ended session seated in w/c with safety belt alarm on and her needs within reach, made comfortable with  blankets and pillows.  2nd session: Pt received sleeping in w/c, awakens easily to voice, agreeable to therapy.  A little confusion noted upon initial arousal but is easily re-oriented. W/c transport for time management to main therapy gym with [place].  Stand<>pivot transfer with minA from w/c to mat table with no AD, cues for stepping pattern and safety approach. Therapist manipulated [place] to adjust for height and L arm positioning, this took some time. Performed several sit<>stands with CGA/minA from mat table height for PFRW adjustments and able to maintain standing with PFRW support and close supervision. Improved standing posture with PFRW adjustments.  Performed gait training, ambulating ~112f + 1820fwith CGA and PFRW. Continues to show shuffling steps and forward flexed trunk, cues throughout for erect posture and forward gaze. Demo's improved self control for anxiety and fearfulness and appears to show improved confidence with functional mobility. Noted worsening anxiety and fearfulness when in "stressful" situations such as when she was in the bathroom trying to position herself over the toilet or navigate the ADL apartment room.   She performed furniture transfers on sofa couch, requiring modA for scooting forward in sofa and modA for rising from seated position to RW. Continues to show posterior lean for transition and has great difficulty correcting this. Pt reporting moderate fatigue from busy day of therapies and also somewhat perseverative on being cold, requesting to return to her room. She ambulated back to her room with CGA and PFRW, ~17533fUpon entering room, requesting to go to bathroom to void. She required modA for managing lower body dressing and modA for controlled descent to standard toilet height. Continent of bladder, charted. Ambulated within her room with CGA/minA and RW to recliner where she remained seated at end of session with BLE elevated, safety belt alarm on, needs within reach, made comfortable. Pt thankful for therapist intervention. She missed 12 minutes of skilled therapy due to  fatigue.   Therapy Documentation Precautions:  Precautions Precautions: Fall Precaution Comments: left radial head fracture with wrist cock up: NWB through wrist and hand Restrictions Weight Bearing Restrictions: No LUE Weight Bearing: Non weight bearing LLE Weight Bearing: Non weight bearing Other Position/Activity Restrictions: L radial head fx  Therapy/Group: Individual Therapy  ChrAlger Simons, DPT 09/04/2020, 7:33 AM

## 2020-09-04 NOTE — Progress Notes (Signed)
Unstageable left lateral breast area wound reported by off going 7a rn and MD notes,patient refuses assessment at tihs time

## 2020-09-04 NOTE — Patient Care Conference (Signed)
Inpatient RehabilitationTeam Conference and Plan of Care Update Date: 09/04/2020   Time: 11:16 AM    Patient Name: [name]      Medical Record Number: [id number]  Date of Birth: 01-10-1918 Sex: Female         Room/Bed: 4M08C/4M08C-01 Payor Info: Payor: [employer] MEDICARE / Plan: [employer] MEDICARE / Product Type: *No Product type* /    Admit Date/Time:  08/26/2020  2:57 PM  Primary Diagnosis:  Traumatic subdural hematoma [facility])  Hospital Problems: Principal Problem:   Traumatic subdural hematoma ([place]) Active Problems:   Hypoalbuminemia due to protein-calorie malnutrition ([place])   Essential hypertension   Dyslipidemia   Transaminitis   Closed displaced fracture of head of right radius   Pressure injury of skin   Leukocytosis   Labile blood pressure   Pain   Unstageable decubitus ulcer ([place])   Acute blood loss anemia    Expected Discharge Date: Expected Discharge Date: 09/12/20  Team Members Present: Physician leading conference: Dr. Delice Lesch Care Coodinator Present: Dorien Chihuahua, RN, BSN, CRRN;Becky Dupree, LCSW Nurse Present: Frances Maywood, RN PT Present: Ginnie Smart, PT OT Present: Leretha Pol, OT SLP Present: Jettie Booze, CF-SLP PPS Coordinator present : [name], Novella Olive, PT     Current Status/Progress Goal Weekly Team Focus  Bowel/Bladder   Patient remains continent of bladder/bowel LBM 09/04/20  Maintain continence  Assess QS/PRN toileting needs, maintain routine pattern   Swallow/Nutrition/ Hydration             ADL's   min-modA for self care and functional transfers  sup/ min A  adl training, safety awareness, functional transfer training, activity tolerance   Mobility   minA sit<>stand and minA for stand pivot. Gait ~156ft with minA and PFRW. Continues to be HIGHLY anxious with mobility but this is SLIGHTLY improving.  Supervision with LRAD (NWB LUE) - monitoring to downgrade?  functional transfers, gait training, standing  balance. DC planning. Will need 24/7 supervision/assist at discharge.   Communication   Min A,  supervision  education, word finding in conversation and following commands   Safety/Cognition/ Behavioral Observations  Min A, recent improvement in clarity  Supervision-Min  education, following 1-2 step commands, basic problem solving, recall aid and emergent   Pain   Denies pain, continue Volteran gel and K-pad     QS/PRN assessment,encourage medication for discomfort   Skin   Skin intact, unstageable,blackish area to lateral left breast no drainage, ecchmotic areas to bilateral arms/hands,  plan debridement to lf breast area, maintain wound healing prevent spread of infection  QS/PRN assess,for complications and educate family/patient     Discharge Planning:  Going back to daughter's home with her husband and daughter assisting along with PCS-aide   Team Discussion: Work up for wound on left breast with debridement of site pending. Ortho order change of spling for left UE. LFTs elevated but improved with lipitor on hold. BP controlled with current regimen. Decreased anxiety however function fluctuates and has trouble maintaining weight bearing precautions even with platform walker and splints. Patient on target to meet rehab goals: yes, currently mod assist for transfers and able to ambulate 200' with min assist using a platform walker. CGA-mod assist for transfers. SLP working on problem solving , error awareness using visual aides.  *See Care Plan and progress notes for long and short-term goals.   Revisions to Treatment Plan:  Custom elbow splint   Teaching Needs: Weight bearing precautions, skin care, transfers, toileting, medications, etc.  Current Barriers to Discharge: Decreased caregiver support, Home enviroment access/layout, Wound care and Weight bearing restrictions  Possible Resolutions to Barriers: Family education Hired help to assist family    Medical  Summary Current Status: Decreased functional build altered mental status secondary to traumatic SDH/SAH/acute metabolic encephalopathy  Barriers to Discharge: Medical stability;Other (comments);Wound care;Behavior;Decreased family/caregiver support;Weight bearing restrictions  Barriers to Discharge Comments: recent left radial fracture Possible Resolutions to [employer] Focus: Therapies, follow labs - LFTs,, optimize BP, follow up Ortho recs regarding need for splint, debride wound, adjust splint   Continued Need for Acute Rehabilitation Level of Care: The patient requires daily medical management by a physician with specialized training in physical medicine and rehabilitation for the following reasons: Direction of a multidisciplinary physical rehabilitation program to maximize functional independence : Yes Medical management of patient stability for increased activity during participation in an intensive rehabilitation regime.: Yes Analysis of laboratory values and/or radiology reports with any subsequent need for medication adjustment and/or medical intervention. : Yes   I attest that I was present, lead the team conference, and concur with the assessment and plan of the team.   Dorien Chihuahua B 09/04/2020, 3:47 PM

## 2020-09-05 ENCOUNTER — Encounter (INDEPENDENT_AMBULATORY_CARE_PROVIDER_SITE_OTHER): Payer: Medicare Other | Admitting: Ophthalmology

## 2020-09-05 ENCOUNTER — Inpatient Hospital Stay (HOSPITAL_COMMUNITY): Payer: Medicare Other

## 2020-09-05 ENCOUNTER — Inpatient Hospital Stay (HOSPITAL_COMMUNITY): Payer: Medicare Other | Admitting: Occupational Therapy

## 2020-09-05 ENCOUNTER — Inpatient Hospital Stay (HOSPITAL_COMMUNITY): Payer: Medicare Other | Admitting: Speech Pathology

## 2020-09-05 DIAGNOSIS — R404 Transient alteration of awareness: Secondary | ICD-10-CM

## 2020-09-05 DIAGNOSIS — R4182 Altered mental status, unspecified: Secondary | ICD-10-CM

## 2020-09-05 DIAGNOSIS — G928 Other toxic encephalopathy: Secondary | ICD-10-CM

## 2020-09-05 LAB — GLUCOSE, CAPILLARY: Glucose-Capillary: 88 mg/dL (ref 70–99)

## 2020-09-05 LAB — TSH: TSH: 5.384 u[IU]/mL — ABNORMAL HIGH (ref 0.350–4.500)

## 2020-09-05 LAB — VITAMIN B12: Vitamin B-12: 804 pg/mL (ref 180–914)

## 2020-09-05 MED ORDER — GADOBUTROL 1 MMOL/ML IV SOLN
5.0000 mL | Freq: Once | INTRAVENOUS | Status: AC | PRN
  Administered 2020-09-05: 5 mL via INTRAVENOUS

## 2020-09-05 NOTE — Significant Event (Signed)
Rapid Response Event Note   Reason for Call :   Facial droop, increased slurred speech and confusion  Initial Focused Assessment:  Patient lying flat in the bed.  She is alert and Oriented to self. Mild facial droop, and dysarthria.  Per LPN she sounds better than she did an hour ago.  She is mildly confused during conversation. 155/60  HR 64  RR 14  O2 sat 99% on RA  Dr Rory Percy at bedside to assess patient.   Interventions:  IV team placed 20 ga NSL Right FA Head CT  NIHSS 2  Plan of Care:  Neuro checks q 2 hr x 6 hr EEG MRI   Event Summary:   MD Notified: Rory Percy Call Time: 1219 Arrival Time: 1222 End Time: [place]  Raliegh Ip, RN

## 2020-09-05 NOTE — Progress Notes (Signed)
Brief note:  Please see progress note from earlier today.  Since that time, patient noted to have facial droop and increased confusion with therapies.  Stat CT head ordered.

## 2020-09-05 NOTE — Procedures (Signed)
Patient Name: [name]  MRN: [id number]  Epilepsy Attending: Lora Havens  Referring Physician/Provider: Anibal Henderson, NP Date: 09/05/2020 Duration: 23.10 mins  Patient history: 87 year old female with recent history of a traumatic left frontal lobe subarachnoid hemorrhage, left frontal parietal subdural hematoma, and a left wrist radial head fracture after a fall on 08/11/2020 with ongoing complications of confusion, decline in functional status, and possible delirium. Patient found with persistent and increased confusion, slurred speech, and transient left-sided facial droop. EEG to evaluate for seizure  Level of alertness: Awake  AEDs during EEG study: None  Technical aspects: This EEG study was done with scalp electrodes positioned according to the 10-20 International system of electrode placement. Electrical activity was acquired at a sampling rate of 500Hz  and reviewed with a high frequency filter of 70Hz  and a low frequency filter of 1Hz . EEG data were recorded continuously and digitally stored.   Description: The posterior dominant rhythm consists of 8 Hz activity of moderate voltage (25-35 uV) seen predominantly in posterior head regions, symmetric and reactive to eye opening and eye closing. EEG showed intermittent 3 to 6 Hz theta-delta slowing in left frontal region. Sharp waves were also noted in left frontal region. Hyperventilation and photic stimulation were not performed.     ABNORMALITY -Sharp wave,left frontal region -Intermittent slow, left frontal region  IMPRESSION: This study showed evidence of epileptogenicity and cortical dysfunction in left frontal region likely secondary to underlying bleed. No seizures were seen throughout the recording.  Priyanka Barbra Sarks

## 2020-09-05 NOTE — Progress Notes (Signed)
EEG Completed; Results Pending  

## 2020-09-05 NOTE — Progress Notes (Signed)
VAST paged for code stroke. Upon arrival at bedside, rapid response nurse present and requested 18g IV in [facility]. Physician arrived at bedside while VAST RN assessing right arm with ultrasound and stated he would not do a perfusion study and a 20g IV would be fine. 20g IV placed in right anterior forearm as charted in flowsheets.

## 2020-09-05 NOTE — Progress Notes (Signed)
Speech Language Pathology Daily Session Note  Patient Details  Name: [name] MRN: [id number] Date of Birth: 10-22-1917  Today's Date: 09/05/2020 SLP Individual Time: 9675-9163 SLP Individual Time Calculation (min): 55 min  Short Term Goals: Week 2: SLP Short Term Goal 1 (Week 2): STG=LTG due to short ELOS  Skilled Therapeutic Interventions:   Patient seen for skilled ST session focusing on cognitive-linguistic goals. Patient appeared anxious which would fluctuate in intensity from mild to moderate. She was not oriented to time or place, asking SLP "Where am I?". She required mod-maximal cues for describing actions in photos and was not able to describe 'What's wrong?' She exhibited phonemic and semantic paraphasias without awareness. She started to repeatedly say "I need to get going", "Can you help me?" and then appeared to be calling out/praying, saying "thank you God", etc and SLP had difficulty determining her awareness of his presence for the majority of session. Patient unable to describe what she meant when saying she needs to "get going" but she seemed to be indicating she wanted to 'get better'/improve. Reportedly, patient was later exhibiting changes in speech, language and mentation and head CT was ordered. Patient continues to benefit from skilled SLP intervention to maximize cognitive-linguistic abilities prior to discharge.  Pain Pain Assessment Pain Scale: Faces Faces Pain Scale: No hurt  Therapy/Group: Individual Therapy  Sonia Baller, MA, CCC-SLP Speech Therapy

## 2020-09-05 NOTE — Progress Notes (Signed)
Patient called out asking for help, therapy alerted me that Patient was presenting with increased confusion and slurred speech. Upon arrival to the room patient was calling out "please help me" noticable right facial droop and difficulty opening right eye. Increased confusion and speech slurred. Dan PA notified of change and Stat CT ordered

## 2020-09-05 NOTE — Consult Note (Addendum)
Neurology Consultation  CC: New onset left facial droop, increasing confusion, slurred speech Consulting provider: Dr. Posey Pronto  History is obtained from: inpatient RN, rapid response RN, inpatient provider, chart review, patient  HPI: [name] is a 87 y.o. female with a medical history significant for hypertension, macular degeneration, large volume right temporal ICH secondary to hypertensive crisis in 2016, amyloid angiopathy, and CKD stage III. She was admitted 08/12/2020 with a left frontal lobe subarachnoid hemorrhage, left frontal parietal subdural hematoma, and a left wrist radial head fracture after a fall on 08/11/2020. During her initial admission, she experienced lethargy and delirium due to pain medication administered for her radial head fracture. She was discharged to home 08/16/20 despite recommended SNF placement and was subsequently readmitted 08/17/20 for ongoing confusion, lethargy, and needing increasing support with ADLs. On readmission, the decision was made to admit the patient to rehab due to her decrease in functional ability and altered mental status. Per chart review, [name] has remained confused since initial insult 08/11/2020.   At baseline prior to admission in December 2021, [name] stayed in a home with her spouse and daughter with assistive devices: chair lift for stairs and rolling walker for ambulation. A caregiver was coming in the morning for patient assistance as needed but she was able to complete ADLs independently.   09/06/2019: [name]'s RN noted around 11:00 AM increased confusion, slurred speech, and left-sided facial droop. She notified the attending provider who ordered a STAT CT head. After one hour of persistent slurred speech and confusion but resolution of left-sided facial droop, a code stroke was called. Per RN, at 10:00 AM assessment, patient was conversational but confused without slurred speech and at 11:00 was increasingly confused and  less conversational. On initial neurology assessment, patient was found to be confused with mild dysarthria. She states "help me" and cries with movement but does not articulate specific complaints or pain when asked.   LKW: 10:00 AM tpa given?: No, recent cerebral hemorrhage (December 2021) IR Thrombectomy? No, no LVO noted Modified Rankin Scale: 4-Needs assistance to walk and tend to bodily needs  NIHSS:  1a Level of Conscious.: 0  1b LOC Questions: 0  1c LOC Commands: 0 2 Best Gaze: 0 3 Visual: 0 4 Facial Palsy: 1 slight right nasolabial fold flattening 5a Motor Arm - left: 0 5b Motor Arm - Right: 1; subtle drift 6a Motor Leg - Left: 0 6b Motor Leg - Right: 1; subtle drift 7 Limb Ataxia: 0 8 Sensory: 0 9 Best Language: 1 10 Dysarthria: 1 11 Extinct. and Inatten.: 0 TOTAL: 5  ROS: A complete ROS was performed and is negative except as noted in the HPI.   Past Medical History:  Diagnosis Date  . Hypertension   . Macular degeneration of left eye   . Osteopenia   . Stroke [facility])    Family History  Problem Relation Age of Onset  . Cancer Daughter        breast   Social History:  reports that she has never smoked. She has never used smokeless tobacco. She reports current alcohol use of about 1.0 standard drink of alcohol per week. She reports that she does not use drugs.  Current Scheduled Medications: . (feeding supplement) PROSource Plus  30 mL Oral BID BM  . diclofenac Sodium  2 g Topical QID  . lidocaine  1 patch Transdermal Q24H  . losartan  50 mg Oral Daily  . multivitamin with minerals  1 tablet  Oral Daily   Current PRN Medications: ipratropium-albuterol  Exam: Current vital signs: BP (!) 149/71   Pulse (!) 57   Temp 98.4 F (36.9 C) (Oral)   Resp 14   Ht 5' (1.524 m)   Wt 70.8 kg   SpO2 100%   BMI 30.47 kg/m   Physical Exam  Constitutional: Appears well-developed, confused. Psych: Anxious, intermittent crying/calling out for help. Eyes: Wears  eyeglasses at baseline. Normal conjunctiva. HENT: Slight right nasolabial fold flattening. Head normocephalic, atraumatic.  Cardiovascular: Extremities warm, 1+ edema of bilateral lower extremities.  Respiratory: Effort normal, non-labored breathing.  GI: Soft.  No distension.  Skin: WDI Neuro: Mental Status: Patient is awake. Able to state name, age, and intermittently states the month. Perseverates when stating age. Repetition intermittently intact with omission of multiple words and dysarthria. Expressive aphasia is present; she struggles to state year and perseverates stating "two hundred" when asked. Patient is confused with mild memory deficit at baseline with possible superimposed delirium. Per chart review, patient has been confused with altered mental status secondary to recent SAH/SDH since initial admission 08/11/2020. No signs of neglect noted.  Cranial Nerves: II: Visual Fields are full. Pupils are equal, round, and reactive to light 24mm brisk bilaterally. III,IV, VI: EOMI without ptosis or diploplia.  V: Facial sensation is symmetric to light touch and temperature.  VII: Facial movement is symmetric, crying, and smiling. Mild right NLF flattening   VIII: Hearing is intact to voice; somewhat hard of hearing. X: Phonation intact XI: Shoulder shrug is symmetric with grimace during assessment of left upper extremity due to recent fall and subsequent wrist fracture.  XII: Tongue protrudes midline Motor: Tone is normal. Bulk is normal. Antigravity movement present in all extremities.  Assessment of LUE slightly limited due to pain from radial head fracture but with 4/5 strength without pronator drift. RUE 4/5 strength with slight drift. Bilateral lower extremities 4/5 with mild drift of the RLE. Sensory: Sensation is symmetric to light touch and cool temperature in bilateral upper and lower extremities. Deep Tendon Reflexes: 2+ and symmetric in the patellae and right bicep. Unable to  assess LUE due to pain and tension with manipulation and soft casting from hand to left shoulder.  Plantars: Toes are downgoing bilaterally.  Cerebellar: FNF and HKS are intact bilaterally despite mild tremor with intention.  Gait: Deferred  I have reviewed labs in epic and the pertinent results are: CBC    Component Value Date/Time   WBC 10.4 09/02/2020 0630   RBC 3.55 (L) 09/02/2020 0630   HGB 10.1 (L) 09/02/2020 0630   HGB 13.7 03/01/2012 1310   HCT 30.4 (L) 09/02/2020 0630   HCT 40.8 03/01/2012 1310   PLT 269 09/02/2020 0630   PLT 168 03/01/2012 1310   MCV 85.6 09/02/2020 0630   MCV 91.8 03/01/2012 1310   MCH 28.5 09/02/2020 0630   MCHC 33.2 09/02/2020 0630   RDW 14.1 09/02/2020 0630   RDW 13.9 03/01/2012 1310   LYMPHSABS 0.7 09/02/2020 0630   LYMPHSABS 1.9 03/01/2012 1310   MONOABS 1.3 (H) 09/02/2020 0630   MONOABS 0.7 03/01/2012 1310   EOSABS 0.1 09/02/2020 0630   EOSABS 0.1 03/01/2012 1310   BASOSABS 0.0 09/02/2020 0630   BASOSABS 0.1 03/01/2012 1310   CMP     Component Value Date/Time   NA 139 09/02/2020 0630   K 3.6 09/02/2020 0630   CL 103 09/02/2020 0630   CO2 24 09/02/2020 0630   GLUCOSE 116 (H) 09/02/2020 0630  BUN 26 (H) 09/02/2020 0630   CREATININE 0.80 09/02/2020 0630   CREATININE 0.89 (H) 07/22/2020 1142   CALCIUM 8.3 (L) 09/02/2020 0630   PROT 5.7 (L) 09/02/2020 0630   ALBUMIN 2.2 (L) 09/02/2020 0630   AST 106 (H) 09/02/2020 0630   ALT 176 (H) 09/02/2020 0630   ALKPHOS 118 09/02/2020 0630   BILITOT 1.0 09/02/2020 0630   GFRNONAA >60 09/02/2020 0630   GFRNONAA 53 (L) 07/22/2020 1142   GFRAA 62 07/22/2020 1142   Fingerstick blood glucose at bedside: 88  I have reviewed the images obtained: 08/18/2020 CT head IMPRESSION: 1. Subdural hematoma along the left cerebral convexity with increased low-density component in the parietal region. The collection measures up to 8 mm in thickness; 2 mm of midline shift. 2. Unchanged subarachnoid  hemorrhage. Small volume intraventricular hemorrhage is now seen.  09/05/2020 CT head IMPRESSION: 1. Negative for acute ischemic infarct. 2. ASPECTS is 10 3. Interval improvement in subarachnoid hemorrhage. Left-sided subdural hematoma is slightly larger but now lower in density. This now measures 9 mm in thickness in the left parietal convexity.  Assessment: 87 year old female with recent history of a traumatic left frontal lobe subarachnoid hemorrhage, left frontal parietal subdural hematoma, and a left wrist radial head fracture after a fall on 08/11/2020 with ongoing complications of confusion, decline in functional status, and possible delirium. Patient found with persistent and increased confusion, slurred speech, and transient left-sided facial droop today prompting the activation of a stroke alert.  Prolonged hospitalization, possible infectious process, seizure etiology, significant cerebral insult 08/11/20, and baseline mild confusion are all likely multifactorial causes of increased/persistent confusion.  Transient facial droop and slurred speech could be multifactorial from previous [address] or may represent TIA/ischemic etiology.  CT head negative for acute ischemic process and reveals improvement in previous SAH but slight enlargement of the left SDH but with lower density.   Impression:  -Increased confusion- concern for delirium versus dementia versus ongoing complications from a recent cerebral SAH/SDH in December 2021, versus seizure; likely multifactorial. Rule out infectious process attributing to increased confusion. -Transient left facial droop - vascular v electrographic abnormality as etiology  Recommendations: - MRI brain w/o contrast. If positive, will pursue stroke w/u  - Urinalysis to rule out infectious etiology of increasing confusion - TSH and vitamin B12 levels - EEG - Q2h neuro checks x 6 hours; notify neurology of acute changes - Avoid sedating medications -Check  CBC, CMP  Assessment and plan discussed and formulated with Dr. Rory Percy who was present on initial examination.  Anibal Henderson, AGAC-NP [facility] [phone]  Attending Neurohospitalist Addendum Patient seen and examined with APP/Resident. Agree with the history and physical as documented above. Agree with the plan as documented, which I helped formulate. I have independently reviewed the chart, obtained history, review of systems and examined the patient.I have personally reviewed pertinent head/neck/spine imaging (CT/MRI). Please feel free to call with any questions.  -- [name], MD Neurologist [facility] Pager: [phone]   CRITICAL CARE ATTESTATION Performed by: [name], MD Total critical care time: 60 minutes Critical care time was exclusive of separately billable procedures and treating other patients and/or supervising APPs/Residents/Students Critical care was necessary to treat or prevent imminent or life-threatening deterioration due to multifactorial toxic metabolic encephalopathy, emergent evaluation for strokelike symptoms This patient is critically ill and at significant risk for neurological worsening and/or death and care requires constant monitoring. Critical care was time spent personally by me on the following activities: development of treatment  plan with patient and/or surrogate as well as nursing, discussions with consultants, evaluation of patient's response to treatment, examination of patient, obtaining history from patient or surrogate, ordering and performing treatments and interventions, ordering and review of laboratory studies, ordering and review of radiographic studies, pulse oximetry, re-evaluation of patient's condition, participation in multidisciplinary rounds and medical decision making of high complexity in the care of this patient.

## 2020-09-05 NOTE — Progress Notes (Signed)
Physical Therapy Session Note  Patient Details  Name: [name] MRN: [id number] Date of Birth: [birth year]  Today's Date: 09/05/2020 PT Individual Time: 0800-0856 PT Individual Time Calculation (min): 56 min   Short Term Goals: Week 2:  PT Short Term Goal 1 (Week 2): STG = LTG due to ELOS  Skilled Therapeutic Interventions/Progress Updates:    Pt greeted supine in bed, awake and agreeable to therapy. She recalls me as her therapist and even remembers details regarding conversations held yesterday. She denies pain but she often will moan and grimmace while completing functional mobility tasks but when asked, she is unable to explain why. Supine<>sit with minA with HOB elevated, no use of bedrails. Requires modA for forward scooting at EOB and able to sit EOB with SBA. She reported need to void, so she stood with minA to [place] from EOB. Initially c/o lightheadness so BP was assessed, reading 161/72. Question the reading as she would tense up her arm while BP was being measured. She denied lightheadedness after a few minutes of sitting.  Ambulated ~18ft with minA and PFRW within her room to bathroom. She required maxA for removing her briefs in standing and modA for controlled lowering to 3-1 BSC. She was continent of bladder, charted. She required minA for rising from 3-1 [facility] and maxA for bringing brief's up. She ambulated another 21ft with minA and PFRW back to her w/c. Provided her with pants and she required totalA for threading them over legs and maxA for pulling them over hips in standing; she got quite flustered while doing this, resulting in increased anxiety and frustration. She required maxA for removing night gown and maxA for donning her sweater. Increased diffuclty with upper body dressing due to her bulky L arm splint.   She ambulated ~19ft with minA and PFRW to her bedroom sink to perform self care tasks. She required CGA for steadying and would lean heavily on the sink for balance.  She brushed her teeth with setupA, combed her hair with minA (unable to reach back of head), and washed her face with setupA. She tolerated standing for >15 minutes while completing these tasks.   She ended session seated in w/c with safety belt alarm on and her needs wtihin reach. Made comfortable with blankets and pillows. Pt appreciative.    Therapy Documentation Precautions:  Precautions Precautions: Fall Precaution Comments: left radial head fracture with wrist cock up: NWB through wrist and hand Restrictions Weight Bearing Restrictions: Yes LUE Weight Bearing: Non weight bearing LLE Weight Bearing: Non weight bearing Other Position/Activity Restrictions: L radial head fx  Therapy/Group: Individual Therapy  Keyana Guevara P [name] PT 09/05/2020, 7:34 AM

## 2020-09-05 NOTE — Progress Notes (Signed)
[employer] PHYSICAL MEDICINE & REHABILITATION PROGRESS NOTE  Subjective/Complaints: Patient seen sitting up in her chair working with therapies.  She states she slept well overnight.  She remains confused and anxious.  ROS: Denies CP, SOB, N/V/D  Objective: Vital Signs: Blood pressure (!) 148/66, pulse 68, temperature 98.4 F (36.9 C), temperature source Oral, resp. rate 19, height 5' (1.524 m), weight 70.8 kg, SpO2 99 %. No results found. No results for input(s): WBC, HGB, HCT, PLT in the last 72 hours. No results for input(s): NA, K, CL, CO2, GLUCOSE, BUN, CREATININE, CALCIUM in the last 72 hours.  Intake/Output Summary (Last 24 hours) at 09/05/2020 1119 Last data filed at 09/05/2020 [id number] Gross per 24 hour  Intake 828 ml  Output 2 ml  Net 826 ml     Pressure Injury 08/26/20 Breast Lateral;Left;Lower Unstageable - Full thickness tissue loss in which the base of the injury is covered by slough (yellow, tan, gray, green or brown) and/or eschar (tan, brown or black) in the wound bed. (Active)  08/26/20 1430  Location: Breast  Location Orientation: Lateral;Left;Lower  Staging: Unstageable - Full thickness tissue loss in which the base of the injury is covered by slough (yellow, tan, gray, green or brown) and/or eschar (tan, brown or black) in the wound bed.  Wound Description (Comments):   Present on Admission: Yes     Pressure Injury 08/26/20 Arm Anterior;Left;Proximal;Upper Stage 2 -  Partial thickness loss of dermis presenting as a shallow open injury with a red, pink wound bed without slough. (Active)  08/26/20 1430  Location: Arm  Location Orientation: Anterior;Left;Proximal;Upper  Staging: Stage 2 -  Partial thickness loss of dermis presenting as a shallow open injury with a red, pink wound bed without slough.  Wound Description (Comments):   Present on Admission: Yes    Physical Exam: BP (!) 148/66 (BP Location: Right Arm)   Pulse 68   Temp 98.4 F (36.9 C) (Oral)    Resp 19   Ht 5' (1.524 m)   Wt 70.8 kg   SpO2 99%   BMI 30.47 kg/m   Constitutional: No distress . Vital signs reviewed. HENT: Normocephalic.  Atraumatic. Eyes: EOMI. No discharge. Cardiovascular: No JVD.  RRR. Respiratory: Normal effort.  No stridor.  Bilateral clear to auscultation. GI: Non-distended.  BS +. Skin: Warm and dry.   Unstageable ulcer to left breast Small stage II ulcer to left proximal medial arm, stable Stage I pressure ulcer to the left axilla Psych: Anxious. Confused. Musc: No edema in extremities.  No tenderness in extremities. Neuro: Alert HOH Motor: Grossly 4/5 throughout, left upper extremity with limitations due to pain, stable  Assessment/Plan: 1. Functional deficits which require 3+ hours per day of interdisciplinary therapy in a comprehensive inpatient rehab setting.  Physiatrist is providing close team supervision and 24 hour management of active medical problems listed below.  Physiatrist and rehab team continue to assess barriers to discharge/monitor patient progress toward functional and medical goals   Care Tool:  Bathing    Body parts bathed by patient: Chest,Abdomen,Face,Left arm,Front perineal area,Right upper leg,Left upper leg   Body parts bathed by helper: Right lower leg,Left lower leg,Buttocks,Right arm     Bathing assist Assist Level: Moderate Assistance - Patient 50 - 74%     Upper Body Dressing/Undressing Upper body dressing   What is the patient wearing?: Pull over shirt    Upper body assist Assist Level: Moderate Assistance - Patient 50 - 74%    Lower Body  Dressing/Undressing Lower body dressing      What is the patient wearing?: Pants,Incontinence brief     Lower body assist Assist for lower body dressing: Maximal Assistance - Patient 25 - 49%     Toileting Toileting    Toileting assist Assist for toileting: Maximal Assistance - Patient 25 - 49%     Transfers Chair/bed transfer  Transfers assist      Chair/bed transfer assist level: Minimal Assistance - Patient > 75%     Locomotion Ambulation   Ambulation assist      Assist level: Moderate Assistance - Patient 50 - 74% Assistive device: Other (comment) (hallway railing R) Max distance: 10   Walk 10 feet activity   Assist     Assist level: Moderate Assistance - Patient - 50 - 74% Assistive device: Other (comment) (hallway railing R)   Walk 50 feet activity   Assist Walk 50 feet with 2 turns activity did not occur: Safety/medical concerns         Walk 150 feet activity   Assist Walk 150 feet activity did not occur: Safety/medical concerns         Walk 10 feet on uneven surface  activity   Assist Walk 10 feet on uneven surfaces activity did not occur: Safety/medical concerns         Wheelchair     Assist Will patient use wheelchair at discharge?: No             Wheelchair 50 feet with 2 turns activity    Assist            Wheelchair 150 feet activity     Assist           Medical Problem List and Plan: 1.Decreased functional build altered mental statussecondary to traumatic SDH/SAH/acute metabolic encephalopathy  Continue CIR 2. Antithrombotics: -DVT/anticoagulation:SCDs  Dopplers negative for DVT -antiplatelet therapy: N/A 3. Pain Management:Lidoderm patch  Voltaren gelQID added to right elbow, improving  K pad  Appears controlled on 1/13  Monitor increased exertion 4. Mood:Provide emotional support -antipsychotic agents: N/A 5. Neuropsych: This patientis not fully capable of making decisions on herown behalf. 6. Skin/Wound Care:Routine skin checks  Pressure relief to left proximal arm, healing stage II  Left lateral breast with unstagable ulcer-discussed with daughter, unclear chronicity of ulcer- ultrasound ordered to rule out breast CA prior to debridement-discussed with radiologist.    Limited ultrasound negative for  underlying malignancy    Plan to debride tomorrow  Left stage I axillary wound 7. Fluids/Electrolytes/Nutrition:Routine in and outs  -encourage appropriate PO 8. Left mildly displaced fracture of the radial head and neck.  -Conservative care.  -Continue to follow Dr. Lorin Mercy as an outpatient.   Patient may use platform walker with elbow splint at 90 degrees per Ortho-we will follow-up with Ortho regarding ability to remove, awaiting further recs 9. Hyperlipidemia.  Lipitor on hold due to elevated LFTs, will hopefully be able to resume tomorrow 10.  Transaminitis.  -recent RUQ [place] suggestive of background liver disease  LFTs elevated, but improving on 1/10, labs ordered for tomorrow 11. HTN:   Cozaar 25 started on 1/5, increased to 50 on 1/10  Relatively controlled on 1/13 12. AKI: Resolved  Creatinine 0.84 on 1/4 13.  Hypoalbuminemia  Supplement initiated on 1/4 14. Leukocytosis: Resolved  WBCs 10.4 on 1/10  Afebrile  Continue to monitor 15.  Acute blood loss anemia  Hemoglobin 10.1 on 1/10, labs ordered for tomorrow  Continue to monitor  LOS: 10 days A FACE TO FACE EVALUATION WAS PERFORMED  Ankit Lorie Phenix 09/05/2020, 11:19 AM

## 2020-09-06 ENCOUNTER — Inpatient Hospital Stay (HOSPITAL_COMMUNITY): Payer: Medicare Other | Admitting: Speech Pathology

## 2020-09-06 ENCOUNTER — Inpatient Hospital Stay (HOSPITAL_COMMUNITY): Payer: Medicare Other | Admitting: Occupational Therapy

## 2020-09-06 ENCOUNTER — Inpatient Hospital Stay (HOSPITAL_COMMUNITY): Payer: Medicare Other

## 2020-09-06 LAB — URINALYSIS, ROUTINE W REFLEX MICROSCOPIC
Bilirubin Urine: NEGATIVE
Glucose, UA: NEGATIVE mg/dL
Hgb urine dipstick: NEGATIVE
Ketones, ur: NEGATIVE mg/dL
Leukocytes,Ua: NEGATIVE
Nitrite: NEGATIVE
Protein, ur: NEGATIVE mg/dL
Specific Gravity, Urine: 1.013 (ref 1.005–1.030)
pH: 8 (ref 5.0–8.0)

## 2020-09-06 LAB — CBC WITH DIFFERENTIAL/PLATELET
Abs Immature Granulocytes: 0.08 10*3/uL — ABNORMAL HIGH (ref 0.00–0.07)
Basophils Absolute: 0 10*3/uL (ref 0.0–0.1)
Basophils Relative: 0 %
Eosinophils Absolute: 0.1 10*3/uL (ref 0.0–0.5)
Eosinophils Relative: 1 %
HCT: 28.9 % — ABNORMAL LOW (ref 36.0–46.0)
Hemoglobin: 9.7 g/dL — ABNORMAL LOW (ref 12.0–15.0)
Immature Granulocytes: 1 %
Lymphocytes Relative: 10 %
Lymphs Abs: 0.9 10*3/uL (ref 0.7–4.0)
MCH: 28.8 pg (ref 26.0–34.0)
MCHC: 33.6 g/dL (ref 30.0–36.0)
MCV: 85.8 fL (ref 80.0–100.0)
Monocytes Absolute: 1.1 10*3/uL — ABNORMAL HIGH (ref 0.1–1.0)
Monocytes Relative: 12 %
Neutro Abs: 7.5 10*3/uL (ref 1.7–7.7)
Neutrophils Relative %: 76 %
Platelets: 272 10*3/uL (ref 150–400)
RBC: 3.37 MIL/uL — ABNORMAL LOW (ref 3.87–5.11)
RDW: 14.3 % (ref 11.5–15.5)
WBC: 9.7 10*3/uL (ref 4.0–10.5)
nRBC: 0 % (ref 0.0–0.2)

## 2020-09-06 LAB — COMPREHENSIVE METABOLIC PANEL
ALT: 174 U/L — ABNORMAL HIGH (ref 0–44)
AST: 74 U/L — ABNORMAL HIGH (ref 15–41)
Albumin: 2.2 g/dL — ABNORMAL LOW (ref 3.5–5.0)
Alkaline Phosphatase: 113 U/L (ref 38–126)
Anion gap: 11 (ref 5–15)
BUN: 19 mg/dL (ref 8–23)
CO2: 25 mmol/L (ref 22–32)
Calcium: 8.3 mg/dL — ABNORMAL LOW (ref 8.9–10.3)
Chloride: 105 mmol/L (ref 98–111)
Creatinine, Ser: 0.85 mg/dL (ref 0.44–1.00)
GFR, Estimated: >60 mL/min (ref >60)
Glucose, Bld: 115 mg/dL — ABNORMAL HIGH (ref 70–99)
Potassium: 3.8 mmol/L (ref 3.5–5.1)
Sodium: 141 mmol/L (ref 135–145)
Total Bilirubin: 1.1 mg/dL (ref 0.3–1.2)
Total Protein: 5.5 g/dL — ABNORMAL LOW (ref 6.5–8.1)

## 2020-09-06 MED ORDER — LOSARTAN POTASSIUM 25 MG PO TABS
25.0000 mg | ORAL_TABLET | Freq: Once | ORAL | Status: AC
  Administered 2020-09-06: 25 mg via ORAL
  Filled 2020-09-06: qty 1

## 2020-09-06 MED ORDER — LOSARTAN POTASSIUM 25 MG PO TABS
75.0000 mg | ORAL_TABLET | Freq: Every day | ORAL | Status: DC
  Administered 2020-09-07 – 2020-09-12 (×6): 75 mg via ORAL
  Filled 2020-09-06 (×6): qty 1

## 2020-09-06 NOTE — Progress Notes (Signed)
Speech Language Pathology Daily Session Note  Patient Details  Name: [name] MRN: [id number] Date of Birth: 1918-03-23  Today's Date: 09/06/2020 SLP Individual Time: 1000-1100 SLP Individual Time Calculation (min): 60 min  Short Term Goals: Week 2: SLP Short Term Goal 1 (Week 2): STG=LTG due to short ELOS  Skilled Therapeutic Interventions:   Patient seen to address cognitive-linguistic goals. Of note, patient is much more interactive and less confused than yesterday (she had STAT CT yesterday due to concerns for new CVA). Patient was able to recall and restate one activity completed "walking down the hall" with PT when PT briefly stopped in to talk to her. Patient was not complaining of cold as she often does and overall, she was calm and pleasant. We tried voice amplifier device which patient to benefit from but stated she prefers not to use. Patient did require modA cues to redirect when she was on a tangent (usually sparked by her not hearing something correctly). Patient continues to be emotionally labile at times and is very much looking forward to returning home. She exhibited mild frequency and intensity of semantic paraphasias and/or phonemic paraphasias. Patient continues to benefit from skilled SLP intervention to maximize cognitive-linguistic abilities prior to discharge.  Pain Pain Assessment Pain Scale: 0-10 Pain Score: 0-No pain  Therapy/Group: Individual Therapy  Sonia Baller, MA, CCC-SLP Speech Therapy

## 2020-09-06 NOTE — Progress Notes (Signed)
Patient ID: [name], female   DOB: 12/06/1917, 87 y.o.   MRN: [id number]  Met with pt and daughter who is present pt feels better today. Daughter feels she is really tired and needs rest and is glad the weekend is here so she can get some. Aware of team conference and has hired [employer] to be there with her at discharge. Continue to work toward discharge 1/20.

## 2020-09-06 NOTE — Progress Notes (Signed)
[place] PHYSICAL MEDICINE & REHABILITATION PROGRESS NOTE  Subjective/Complaints: Patient seen sitting up in her chair working with therapies this morning.  She states she slept very well overnight.  Discussed cognition with therapies, appears to be back to baseline.  Discussed with neurology as well yesterday.  ROS: Denies CP, SOB, N/V/D  Objective: Vital Signs: Blood pressure (!) 158/82, pulse 67, temperature 98.5 F (36.9 C), resp. rate 20, height 5' (1.524 m), weight 70.8 kg, SpO2 95 %. MR BRAIN W WO CONTRAST  Result Date: 09/05/2020 CLINICAL DATA:  Mental status changes. Right facial droop. Acute stroke suspected. EXAM: MRI HEAD WITHOUT AND WITH CONTRAST TECHNIQUE: Multiplanar, multiecho pulse sequences of the brain and surrounding structures were obtained without and with intravenous contrast. CONTRAST:  7mL GADAVIST GADOBUTROL 1 MMOL/ML IV SOLN COMPARISON:  Head CT same day. FINDINGS: Brain: Diffusion imaging does not show any acute or subacute infarction. There chronic small-vessel ischemic changes of the pons. 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 Residual hemosiderin deposition. Subdural hematoma along the left convexity, with some heterogeneity. Maximal thickness in the posterior parietal region is 10 mm. Small amount of subarachnoid blood in a left frontal sulcus simulates infarction on diffusion imaging. No true infarction is identified. No midline shift. 12 mm right para falcine meningioma is unchanged without mass-effect upon the brain. Chronic small-vessel ischemic changes elsewhere affecting the thalami and hemispheric white matter. Vascular: Major vessels at the base of the brain show flow. Skull and upper cervical spine: Negative Sinuses/Orbits: Clear/normal Other: None IMPRESSION: 1. No acute stroke by MRI. 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 Extensive  chronic small-vessel ischemic changes throughout the brain as outlined above. 2. Subdural hematoma along the left convexity, maximal thickness 10 mm. No midline shift. Small amount of subarachnoid blood in a left frontal sulcus simulates infarction on diffusion imaging. Electronically Signed   By: Nelson Chimes M.D.   On: 09/05/2020 19:18   EEG adult  Result Date: 09/05/2020 Lora Havens, MD     09/05/2020  4:48 PM Patient Name: [name] MRN: [id number] Epilepsy Attending: Lora Havens Referring Physician/Provider: Anibal Henderson, NP Date: 09/05/2020 Duration: 23.10 mins Patient history: 87 year old female with recent history of a traumatic left frontal lobe subarachnoid hemorrhage, left frontal parietal subdural hematoma, and a left wrist radial head fracture after a fall on 08/11/2020 with ongoing complications of confusion, decline in functional status, and possible delirium. Patient found with persistent and increased confusion, slurred speech, and transient left-sided facial droop. EEG to evaluate for seizure Level of alertness: Awake AEDs during EEG study: None Technical aspects: This EEG study was done with scalp electrodes positioned according to the 10-20 International system of electrode placement. Electrical activity was acquired at a sampling rate of 500Hz  and reviewed with a high frequency filter of 70Hz  and a low frequency filter of 1Hz . EEG data were recorded continuously and digitally stored. Description: The posterior dominant rhythm consists of 8 Hz activity of moderate voltage (25-35 uV) seen predominantly in posterior head regions, symmetric and reactive to eye opening and eye closing. EEG showed intermittent 3 to 6 Hz theta-delta slowing in left frontal region. Sharp waves were also noted in left frontal region. Hyperventilation and photic stimulation were not performed.   ABNORMALITY -Sharp wave,left frontal region -Intermittent slow, left frontal region IMPRESSION: This study  showed evidence of epileptogenicity and cortical dysfunction in left frontal region likely secondary to  underlying bleed. No seizures were seen throughout the recording. Priyanka Barbra Sarks   CT HEAD CODE STROKE WO CONTRAST`  Result Date: 09/05/2020 CLINICAL DATA:  Code stroke. Mental status change. Right facial droop. EXAM: CT HEAD WITHOUT CONTRAST TECHNIQUE: Contiguous axial images were obtained from the base of the skull through the vertex without intravenous contrast. COMPARISON:  CT head 08/18/2020. FINDINGS: Brain: Previously noted mild anterior subarachnoid hemorrhage on the left has resolved. Small interhemispheric subdural hematoma anteriorly on the right is unchanged. Small parenchymal appearing hemorrhage in the right medial occipital lobe unchanged. Previously noted left-sided subdural hematoma is now lower density. No residual high-density subdural. Left parietal convexity hypodense fluid collection measures 9 mm in thickness with mild mass-effect on the left parietal lobe. This is slightly larger in size. Left tentorial subdural hematoma with interval improvement. Chronic infarct in the right temporal lobe unchanged. Patchy white matter hypodensity bilaterally unchanged. Generalized atrophy. Slight midline shift to the right is unchanged. No hydrocephalus. Vascular: Negative for hyperdense vessel. Atherosclerotic calcification in the cavernous carotid bilaterally. Skull: No acute skeletal abnormality. Sinuses/Orbits: Mild mucosal edema paranasal sinuses. Bilateral cataract extraction. Other: None ASPECTS ([place] Stroke Program Early CT Score) - Ganglionic level infarction (caudate, lentiform nuclei, internal capsule, insula, M1-M3 cortex): 7 - Supraganglionic infarction (M4-M6 cortex): 3 Total score (0-10 with 10 being normal): 10 IMPRESSION: 1. Negative for acute ischemic infarct. 2. ASPECTS is 10 3. Interval improvement in subarachnoid hemorrhage. Left-sided subdural hematoma is slightly larger but  now lower in density. This now measures 9 mm in thickness in the left parietal convexity. 4. Code stroke imaging results were communicated on 09/05/2020 at 1:04 pm to provider Rory Percy via text page Electronically Signed   By: Franchot Gallo M.D.   On: 09/05/2020 13:04   Recent Labs    09/06/20 0521  WBC 9.7  HGB 9.7*  HCT 28.9*  PLT 272   Recent Labs    09/06/20 0521  NA 141  K 3.8  CL 105  CO2 25  GLUCOSE 115*  BUN 19  CREATININE 0.85  CALCIUM 8.3*    Intake/Output Summary (Last 24 hours) at 09/06/2020 1029 Last data filed at 09/05/2020 1930 Gross per 24 hour  Intake 0 ml  Output -  Net 0 ml     Pressure Injury 08/26/20 Breast Lateral;Left;Lower Unstageable - Full thickness tissue loss in which the base of the injury is covered by slough (yellow, tan, gray, green or brown) and/or eschar (tan, brown or black) in the wound bed. (Active)  08/26/20 1430  Location: Breast  Location Orientation: Lateral;Left;Lower  Staging: Unstageable - Full thickness tissue loss in which the base of the injury is covered by slough (yellow, tan, gray, green or brown) and/or eschar (tan, brown or black) in the wound bed.  Wound Description (Comments):   Present on Admission: Yes     Pressure Injury 08/26/20 Arm Anterior;Left;Proximal;Upper Stage 2 -  Partial thickness loss of dermis presenting as a shallow open injury with a red, pink wound bed without slough. (Active)  08/26/20 1430  Location: Arm  Location Orientation: Anterior;Left;Proximal;Upper  Staging: Stage 2 -  Partial thickness loss of dermis presenting as a shallow open injury with a red, pink wound bed without slough.  Wound Description (Comments):   Present on Admission: Yes    Physical Exam: BP (!) 158/82 (BP Location: Right Arm)   Pulse 67   Temp 98.5 F (36.9 C)   Resp 20   Ht 5' (1.524 m)  Wt 70.8 kg   SpO2 95%   BMI 30.47 kg/m   Constitutional: No distress . Vital signs reviewed. HENT: Normocephalic.   Atraumatic. Eyes: EOMI. No discharge. Cardiovascular: No JVD.  RRR. Respiratory: Normal effort.  No stridor.  Bilateral clear to auscultation. GI: Non-distended.  BS +. Skin: Warm and dry.   Unstageable ulcer to left breast,?  Improving Small stage II ulcer to left proximal medial arm, unchanged Stage I pressure ulcer to the left axilla Psych: Normal mood.  Confused. Musc: No edema in extremities.  No tenderness in extremities. Neuro: Alert HOH Motor: Grossly 4/5 throughout, left upper extremity with limitations due to pain, unchanged  Assessment/Plan: 1. Functional deficits which require 3+ hours per day of interdisciplinary therapy in a comprehensive inpatient rehab setting.  Physiatrist is providing close team supervision and 24 hour management of active medical problems listed below.  Physiatrist and rehab team continue to assess barriers to discharge/monitor patient progress toward functional and medical goals   Care Tool:  Bathing    Body parts bathed by patient: Chest,Abdomen,Face,Left arm,Front perineal area,Right upper leg,Left upper leg   Body parts bathed by helper: Right lower leg,Left lower leg,Buttocks,Right arm     Bathing assist Assist Level: Moderate Assistance - Patient 50 - 74%     Upper Body Dressing/Undressing Upper body dressing   What is the patient wearing?: Pull over shirt    Upper body assist Assist Level: Moderate Assistance - Patient 50 - 74%    Lower Body Dressing/Undressing Lower body dressing      What is the patient wearing?: Pants,Incontinence brief     Lower body assist Assist for lower body dressing: Maximal Assistance - Patient 25 - 49%     Toileting Toileting    Toileting assist Assist for toileting: Maximal Assistance - Patient 25 - 49%     Transfers Chair/bed transfer  Transfers assist     Chair/bed transfer assist level: Minimal Assistance - Patient > 75%     Locomotion Ambulation   Ambulation assist       Assist level: Moderate Assistance - Patient 50 - 74% Assistive device: Other (comment) (hallway railing R) Max distance: 10   Walk 10 feet activity   Assist     Assist level: Moderate Assistance - Patient - 50 - 74% Assistive device: Other (comment) (hallway railing R)   Walk 50 feet activity   Assist Walk 50 feet with 2 turns activity did not occur: Safety/medical concerns         Walk 150 feet activity   Assist Walk 150 feet activity did not occur: Safety/medical concerns         Walk 10 feet on uneven surface  activity   Assist Walk 10 feet on uneven surfaces activity did not occur: Safety/medical concerns         Wheelchair     Assist Will patient use wheelchair at discharge?: No             Wheelchair 50 feet with 2 turns activity    Assist            Wheelchair 150 feet activity     Assist           Medical Problem List and Plan: 1.Decreased functional build altered mental statussecondary to traumatic SDH/SAH/acute metabolic encephalopathy  Continue CIR 2. Antithrombotics: -DVT/anticoagulation:SCDs  Dopplers negative for DVT -antiplatelet therapy: N/A 3. Pain Management:Lidoderm patch  Voltaren gelQID added to right elbow  K pad  Appears controlled  on 1/14  Monitor increased exertion 4. Mood:Provide emotional support -antipsychotic agents: N/A 5. Neuropsych: This patientis not fully capable of making decisions on herown behalf. 6. Skin/Wound Care:Routine skin checks  Pressure relief to left proximal arm, healing stage II  Left lateral breast with unstagable ulcer-discussed with daughter, unclear chronicity of ulcer- ultrasound ordered to rule out breast CA prior to debridement-discussed with radiologist.    Limited ultrasound negative for underlying malignancy    Will consider debridement if no improvement  Left stage I axillary wound 7. Fluids/Electrolytes/Nutrition:Routine  in and outs  -encourage appropriate PO 8. Left mildly displaced fracture of the radial head and neck.  -Conservative care.  -Continue to follow Dr. Lorin Mercy as an outpatient.   Patient may use platform walker with elbow splint at 90 degrees per Ortho-we will follow-up with Ortho regarding ability to remove, continue to await further recs 9. Hyperlipidemia.  Lipitor on hold due to elevated LFTs, will hopefully be able to resume next week 10.  Transaminitis.  -recent RUQ [place] suggestive of background liver disease  LFTs elevated, but improving on 1/14 11. HTN:   Cozaar 25 started on 1/5, increased to 50 on 1/10, increased to 75 on 1/14 12. AKI: Resolved  Creatinine 0.84 on 1/4 13.  Hypoalbuminemia  Supplement initiated on 1/4 14. Leukocytosis: Resolved  WBCs 10.4 on 1/10  Afebrile  Continue to monitor 15.  Acute blood loss anemia  Hemoglobin 9.7 on 1/14  Continue to monitor  14.  AMS  EEG unremarkable for seizures  CT/MRI unchanged  UA relatively unremarkable  Appears to be back to baseline on 1/14-per report patient has tendency to wax/wane PTA  LOS: 11 days A FACE TO FACE EVALUATION WAS PERFORMED  Ankit Lorie Phenix 09/06/2020, 10:29 AM

## 2020-09-06 NOTE — Progress Notes (Signed)
Neurology Progress Note  S: Patient seen and examined.  O: Current vital signs: BP (!) 158/82 (BP Location: Right Arm)   Pulse 67   Temp 98.5 F (36.9 C)   Resp 20   Ht 5' (1.524 m)   Wt 70.8 kg   SpO2 95%   BMI 30.47 kg/m  Vital signs in last 24 hours: Temp:  [98.4 F (36.9 C)-99.1 F (37.3 C)] 98.5 F (36.9 C) (01/14 0342) Pulse Rate:  [57-78] 67 (01/14 0342) Resp:  [14-20] 20 (01/14 0342) BP: (148-164)/(60-100) 158/82 (01/14 0342) SpO2:  [93 %-100 %] 95 % (01/14 0342)  GENERAL: Awake, alert in NAD HEENT: Normocephalic and atraumatic LUNGS: Normal respiratory effort.  CV: RRR  Ext: warm  NEURO: Mental Status: Alert. Oriented to name and age.  Speech/Language: speech is without dysarthria.  CN: II: Visual Fields are full. Pupils are equal, round, and reactive to light 66mm brisk bilaterally. III,IV, VI: EOMI without ptosis or diploplia.  V: Facial sensation is symmetric to light touch  VII: Facial movement is symmetric. Mild right NLF flattening   VIII: Hearing is intact to voice.HOH X: Phonation intact XI: Shoulder shrug is symmetric with grimace during assessment of left upper extremity due to recent fall and subsequent wrist fracture.  XII: Tongue protrudes midline Gait: was sitting in wheelchair, then stood with walker and one person assist and walked to bathroom with examiner watching.   Medications  Current Facility-Administered Medications:  .  (feeding supplement) PROSource Plus liquid 30 mL, 30 mL, Oral, BID BM, Jamse Arn, MD, 30 mL at 09/06/20 0851 .  diclofenac Sodium (VOLTAREN) 1 % topical gel 2 g, 2 g, Topical, QID, Posey Pronto, Domenick Bookbinder, MD, 2 g at 09/05/20 1515 .  ipratropium-albuterol (DUONEB) 0.5-2.5 (3) MG/3ML nebulizer solution 3 mL, 3 mL, Nebulization, Q4H PRN, Angiulli, Daniel J, PA-C .  lidocaine (LIDODERM) 5 % 1 patch, 1 patch, Transdermal, Q24H, Angiulli, Lavon Paganini, PA-C, 1 patch at 09/04/20 1318 .  losartan (COZAAR) tablet 25 mg, 25 mg,  Oral, Once, Jamse Arn, MD .  Derrill Memo ON 09/07/2020] losartan (COZAAR) tablet 75 mg, 75 mg, Oral, Daily, Posey Pronto, Domenick Bookbinder, MD .  multivitamin with minerals tablet 1 tablet, 1 tablet, Oral, Daily, Cathlyn Parsons, PA-C, 1 tablet at 09/06/20 0851   CMP     Component Value Date/Time   NA 141 09/06/2020 0521   K 3.8 09/06/2020 0521   CL 105 09/06/2020 0521   CO2 25 09/06/2020 0521   GLUCOSE 115 (H) 09/06/2020 0521   BUN 19 09/06/2020 0521   CREATININE 0.85 09/06/2020 0521   CREATININE 0.89 (H) 07/22/2020 1142   CALCIUM 8.3 (L) 09/06/2020 0521   PROT 5.5 (L) 09/06/2020 0521   ALBUMIN 2.2 (L) 09/06/2020 0521   AST 74 (H) 09/06/2020 0521   ALT 174 (H) 09/06/2020 0521   ALKPHOS 113 09/06/2020 0521   BILITOT 1.1 09/06/2020 0521   GFRNONAA >60 09/06/2020 0521   GFRNONAA 53 (L) 07/22/2020 1142   GFRAA 62 07/22/2020 1142    Imaging 09/05/20 CODE STROKE CT Head showed  1. Negative for acute ischemic infarct. 2. ASPECTS is 10 3. Interval improvement in subarachnoid hemorrhage. Left-sided subdural hematoma is slightly larger but now lower in density. This now measures 9 mm in thickness in the left parietal convexity.  09/05/20 MRI showed  1. No acute stroke by MRI. 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 Extensive chronic small-vessel ischemic changes throughout the brain as outlined above. 2.  Subdural hematoma along the left convexity, maximal thickness 10 mm. No midline shift. Small amount of subarachnoid blood in a left frontal sulcus simulates infarction on diffusion imaging.  EEG with some sharp waves in the left frontal lesion and intermittent slowing in the left frontal region likely secondary to the underlying bleed.  Assessment: 87 yr old female who was admitted to hospital after a fall on 08/11/20-08/16/20. SNF was recommended on discharge, but family took her home. She was only home 24 hrs and was brought back to ED due to increased confusion over  baseline and over at the time of of [place]. Findings were felt to be from metabolic encephalopathy and likely opioid use at home. OSH CT showed 50mm SAH and 60mm midline shift compared to previous scans. NSU was consulted and felt no surgical intervention was needed. On 08/21/20 CIR was recommended. Bed became available on 08/26/20 and she was placed on 71M here. On 09/05/20, it was felt by RN to have an acute change in mental status along with dysarthria, increased confusion and left sided facial droop. CODE STROKE was eventually called which showed above imaging results. According to the family, she waxes and wanes in neuro status. Was not a candidate for tPA due to recent cerebral hemorrhage and modified Rankin Score. UA neg. B12 804, TSH 5.384.   Impression:  1. Waxing and waning neuro status is chronic per family. 2. Delirium vs dementia vs ongoing complication of SAH/SDH from Dec 2021.  3. Infectious workup thus far is negative.   Recommendations: -No stroke w/up warranted in light of MRI neg for stroke.  -Continue to treat any suspected infection.  -Delirium precautions.  -Avoid narcotic and benzo use.  -I am not going to recommend any antiepileptics unless there is frank seizures given her advanced age and dementia. -Neuro will be available for questions or concerns.   Pt seen by Clance Boll, MSN, APN-BC/Nurse Practitioner/Neuro and later by MD. Note and plan to be edited as needed by MD.  Pager: [phone]  Attending Neurohospitalist Addendum Patient seen and examined with APP/Resident. Agree with the history and physical as documented above. Agree with the plan as documented, which I helped formulate. I have independently reviewed the chart, obtained history, review of systems and examined the patient.I have personally reviewed pertinent head/neck/spine imaging (CT/MRI). Plan discussed with PMR PA-C. Please feel free to call with any questions.  -- [name],  MD Neurologist [facility] Pager: [phone]

## 2020-09-06 NOTE — Progress Notes (Signed)
Physical Therapy Session Note  Patient Details  Name: [name] MRN: [id number] Date of Birth: 03-13-1918  Today's Date: 09/06/2020 PT Individual Time: 7654-6503 PT Individual Time Calculation (min): 70 min   Short Term Goals: Week 2:  PT Short Term Goal 1 (Week 2): STG = LTG due to ELOS  Skilled Therapeutic Interventions/Progress Updates:    Pt received seated in w/c, sleeping on arrival, awakens to voice and agreeable to therapy. No c/o pain when asked and reports she likes the fitting of her custom L arm splint from OT. Increased confusion today as compared to prior sessions. Recognizes me as her therapist but unable to recall name. Reports year as 2020 but correct month, unaware of the date or the current president. She continues to have difficulty completing and articulating her thoughts and will become quite flustered when she can't.  W/c transport to main therapy gym for time management.   Gait training x27ft with minA and RW. Continues to show short shuffling steps with grossly reduced gait speed without for LOB but significant unsteadiness present. Seated rest on mat table with blanket provided for comfort as pt was somewhat perseverative on being cold. Gait training ~110ft with minA and RW back to her room with similar deficits as noted above and requiring TC for R hand placement as she tends to place R hand further forward on RW rather than on grip.  Dynamic standing balance with horseshoe toss, using RUE and minA guard for steadying. Pt enjoyed this and was determined to get a ringer on the horseshoe! She then was instructed to hang the horseshoes on the rim of the basketball, placed on her R side, focusing on functional reaching outside cone of stability, requiring minA guard for completing. Pt requiring seated rest breaks due to fatigue while completing these tasks.  Pt ended session seated in w/c with needs in reach and safety belt alarm on, made comfortable with blankets and  pillows, pt appreciative.  Therapy Documentation Precautions:  Precautions Precautions: Fall Precaution Comments: left radial head fracture with wrist cock up: NWB through wrist and hand Restrictions Weight Bearing Restrictions: Yes LUE Weight Bearing: Non weight bearing LLE Weight Bearing: Non weight bearing Other Position/Activity Restrictions: L radial head fx  Therapy/Group: Individual Therapy  Sirinity Outland P Kalijah Westfall PT 09/06/2020, 7:29 AM

## 2020-09-06 NOTE — Progress Notes (Signed)
Occupational Therapy Session Note  Patient Details  Name: [name] MRN: [id number] Date of Birth: [birth year]  Today's Date: 09/06/2020 OT Individual Time:1105  -1220  Total Time: 75 mins    Short Term Goals: Week 2:  OT Short Term Goal 1 (Week 2): STGs=LTGs due to ELOS  Skilled Therapeutic Interventions/Progress Updates:    Pt sitting up in w/c, pleasantly confused, agreeable to OT session.  Per MD verbal orders, custom fabrication of left elbow static orthosis with wrist included completed in order to facilitate improved skin intergrity and ROM of left digits.  Pt required max assist to doff long sleeve shirt and donn gown for improved access to LUE.  Blanket placed around shoulders due to pt c/o feeling cold.  Pt transported to ortho gym for splint fabrication.  Sleeve applied to LUE.  Fabricated left elbow splint placing elbow in 90 degrees of flexion, forearm in neutral rotation, and wrist in 5 degrees extension.  Soft strap applied and edges padded where appropriate to protect skin integrity.  Pt reports no pain or discomfort with orthosis donned. Pt transported to room and OT educated dtr who was present regarding splint full time schedule and precautions.  Call bell in reach, seat belt alarm on.    Therapy Documentation Precautions:  Precautions Precautions: Fall Precaution Comments: left radial head fracture with wrist cock up: NWB through wrist and hand Restrictions Weight Bearing Restrictions: Yes LUE Weight Bearing: Non weight bearing LLE Weight Bearing: Non weight bearing Other Position/Activity Restrictions: L radial head fx   Therapy/Group: Individual Therapy  Ezekiel Slocumb 09/06/2020, 1:35 PM

## 2020-09-07 ENCOUNTER — Inpatient Hospital Stay (HOSPITAL_COMMUNITY): Payer: Medicare Other | Admitting: Physical Therapy

## 2020-09-07 NOTE — Progress Notes (Signed)
[place] PHYSICAL MEDICINE & REHABILITATION PROGRESS NOTE  Subjective/Complaints: More interactive and less confused today Was sleeping upright in WC, but easily arousable MRI brain 1/13 shows no acute stroke but extensive chronic small vessel ischemic changes  ROS: Denies CP, SOB, N/V/D, pain  Objective: Vital Signs: Blood pressure 123/61, pulse 83, temperature 99.5 F (37.5 C), temperature source Oral, resp. rate 20, height 5' (1.524 m), weight 70.8 kg, SpO2 96 %. MR BRAIN W WO CONTRAST  Result Date: 09/05/2020 CLINICAL DATA:  Mental status changes. Right facial droop. Acute stroke suspected. EXAM: MRI HEAD WITHOUT AND WITH CONTRAST TECHNIQUE: Multiplanar, multiecho pulse sequences of the brain and surrounding structures were obtained without and with intravenous contrast. CONTRAST:  62mL GADAVIST GADOBUTROL 1 MMOL/ML IV SOLN COMPARISON:  Head CT same day. FINDINGS: Brain: Diffusion imaging does not show any acute or subacute infarction. There chronic small-vessel ischemic changes of the pons. 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 Residual hemosiderin deposition. Subdural hematoma along the left convexity, with some heterogeneity. Maximal thickness in the posterior parietal region is 10 mm. Small amount of subarachnoid blood in a left frontal sulcus simulates infarction on diffusion imaging. No true infarction is identified. No midline shift. 12 mm right para falcine meningioma is unchanged without mass-effect upon the brain. Chronic small-vessel ischemic changes elsewhere affecting the thalami and hemispheric white matter. Vascular: Major vessels at the base of the brain show flow. Skull and upper cervical spine: Negative Sinuses/Orbits: Clear/normal Other: None IMPRESSION: 1. No acute stroke by MRI. 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 Extensive chronic small-vessel  ischemic changes throughout the brain as outlined above. 2. Subdural hematoma along the left convexity, maximal thickness 10 mm. No midline shift. Small amount of subarachnoid blood in a left frontal sulcus simulates infarction on diffusion imaging. Electronically Signed   By: Nelson Chimes M.D.   On: 09/05/2020 19:18   EEG adult  Result Date: 09/05/2020 Lora Havens, MD     09/05/2020  4:48 PM Patient Name: [name] MRN: [id number] Epilepsy Attending: Lora Havens Referring Physician/Provider: Anibal Henderson, NP Date: 09/05/2020 Duration: 23.10 mins Patient history: 87 year old female with recent history of a traumatic left frontal lobe subarachnoid hemorrhage, left frontal parietal subdural hematoma, and a left wrist radial head fracture after a fall on 08/11/2020 with ongoing complications of confusion, decline in functional status, and possible delirium. Patient found with persistent and increased confusion, slurred speech, and transient left-sided facial droop. EEG to evaluate for seizure Level of alertness: Awake AEDs during EEG study: None Technical aspects: This EEG study was done with scalp electrodes positioned according to the 10-20 International system of electrode placement. Electrical activity was acquired at a sampling rate of 500Hz  and reviewed with a high frequency filter of 70Hz  and a low frequency filter of 1Hz . EEG data were recorded continuously and digitally stored. Description: The posterior dominant rhythm consists of 8 Hz activity of moderate voltage (25-35 uV) seen predominantly in posterior head regions, symmetric and reactive to eye opening and eye closing. EEG showed intermittent 3 to 6 Hz theta-delta slowing in left frontal region. Sharp waves were also noted in left frontal region. Hyperventilation and photic stimulation were not performed.   ABNORMALITY -Sharp wave,left frontal region -Intermittent slow, left frontal region IMPRESSION: This study showed evidence of  epileptogenicity and cortical dysfunction in left frontal region likely secondary to underlying bleed. No seizures were seen throughout  the recording. Lora Havens   Recent Labs    09/06/20 0521  WBC 9.7  HGB 9.7*  HCT 28.9*  PLT 272   Recent Labs    09/06/20 0521  NA 141  K 3.8  CL 105  CO2 25  GLUCOSE 115*  BUN 19  CREATININE 0.85  CALCIUM 8.3*    Intake/Output Summary (Last 24 hours) at 09/07/2020 1633 Last data filed at 09/07/2020 1300 Gross per 24 hour  Intake 617 ml  Output --  Net 617 ml     Pressure Injury 08/26/20 Breast Lateral;Left;Lower Unstageable - Full thickness tissue loss in which the base of the injury is covered by slough (yellow, tan, gray, green or brown) and/or eschar (tan, brown or black) in the wound bed. (Active)  08/26/20 1430  Location: Breast  Location Orientation: Lateral;Left;Lower  Staging: Unstageable - Full thickness tissue loss in which the base of the injury is covered by slough (yellow, tan, gray, green or brown) and/or eschar (tan, brown or black) in the wound bed.  Wound Description (Comments):   Present on Admission: Yes     Pressure Injury 08/26/20 Arm Anterior;Left;Proximal;Upper Stage 2 -  Partial thickness loss of dermis presenting as a shallow open injury with a red, pink wound bed without slough. (Active)  08/26/20 1430  Location: Arm  Location Orientation: Anterior;Left;Proximal;Upper  Staging: Stage 2 -  Partial thickness loss of dermis presenting as a shallow open injury with a red, pink wound bed without slough.  Wound Description (Comments):   Present on Admission: Yes    Physical Exam: BP 123/61 (BP Location: Right Arm)   Pulse 83   Temp 99.5 F (37.5 C) (Oral)   Resp 20   Ht 5' (1.524 m)   Wt 70.8 kg   SpO2 96%   BMI 30.47 kg/m   Gen: no distress, normal appearing HEENT: oral mucosa pink and moist, NCAT Cardio: Reg rate Chest: normal effort, normal rate of breathing Abd: soft, non-distended Ext: no  edema Psych: pleasant, normal affect Skin:  Unstageable ulcer to left breast,?  Improving Small stage II ulcer to left proximal medial arm, unchanged Stage I pressure ulcer to the left axilla Psych: Normal mood.  Confused. Musc: No edema in extremities.  No tenderness in extremities. Neuro: Alert HOH Motor: Grossly 4/5 throughout, left upper extremity with limitations due to pain, unchanged  Assessment/Plan: 1. Functional deficits which require 3+ hours per day of interdisciplinary therapy in a comprehensive inpatient rehab setting.  Physiatrist is providing close team supervision and 24 hour management of active medical problems listed below.  Physiatrist and rehab team continue to assess barriers to discharge/monitor patient progress toward functional and medical goals   Care Tool:  Bathing    Body parts bathed by patient: Chest,Abdomen,Face,Left arm,Front perineal area,Right upper leg,Left upper leg   Body parts bathed by helper: Right lower leg,Left lower leg,Buttocks,Right arm     Bathing assist Assist Level: Moderate Assistance - Patient 50 - 74%     Upper Body Dressing/Undressing Upper body dressing   What is the patient wearing?: Pull over shirt    Upper body assist Assist Level: Moderate Assistance - Patient 50 - 74%    Lower Body Dressing/Undressing Lower body dressing      What is the patient wearing?: Pants,Incontinence brief     Lower body assist Assist for lower body dressing: Maximal Assistance - Patient 25 - 49%     Toileting Toileting    Toileting assist Assist for  toileting: Maximal Assistance - Patient 25 - 49%     Transfers Chair/bed transfer  Transfers assist     Chair/bed transfer assist level: Minimal Assistance - Patient > 75%     Locomotion Ambulation   Ambulation assist      Assist level: Moderate Assistance - Patient 50 - 74% Assistive device: Other (comment) (hallway railing R) Max distance: 10   Walk 10 feet  activity   Assist     Assist level: Moderate Assistance - Patient - 50 - 74% Assistive device: Other (comment) (hallway railing R)   Walk 50 feet activity   Assist Walk 50 feet with 2 turns activity did not occur: Safety/medical concerns         Walk 150 feet activity   Assist Walk 150 feet activity did not occur: Safety/medical concerns         Walk 10 feet on uneven surface  activity   Assist Walk 10 feet on uneven surfaces activity did not occur: Safety/medical concerns         Wheelchair     Assist Will patient use wheelchair at discharge?: No             Wheelchair 50 feet with 2 turns activity    Assist            Wheelchair 150 feet activity     Assist           Medical Problem List and Plan: 1.Decreased functional build altered mental statussecondary to traumatic SDH/SAH/acute metabolic encephalopathy  Continue CIR 2. Antithrombotics: -DVT/anticoagulation:SCDs  Dopplers negative for DVT -antiplatelet therapy: N/A 3. Pain Management:Lidoderm patch  Voltaren gelQID added to right elbow  K pad  Appears controlled on 1/15-continue voltaren gel.   Monitor increased exertion 4. Mood:Provide emotional support -antipsychotic agents: N/A 5. Neuropsych: This patientis not fully capable of making decisions on herown behalf. 6. Skin/Wound Care:Routine skin checks  Pressure relief to left proximal arm, healing stage II  Left lateral breast with unstagable ulcer-discussed with daughter, unclear chronicity of ulcer- ultrasound ordered to rule out breast CA prior to debridement-discussed with radiologist.    Limited ultrasound negative for underlying malignancy    Will consider debridement if no improvement  Left stage I axillary wound 7. Fluids/Electrolytes/Nutrition:Routine in and outs  -encourage appropriate PO 8. Left mildly displaced fracture of the radial head and neck.   -Conservative care.  -Continue to follow Dr. Lorin Mercy as an outpatient.   Patient may use platform walker with elbow splint at 90 degrees per Ortho-we will follow-up with Ortho regarding ability to remove, continue to await further recs 9. Hyperlipidemia.  Lipitor on hold due to elevated LFTs, will hopefully be able to resume next week 10.  Transaminitis.  -recent RUQ [place] suggestive of background liver disease  LFTs elevated, but improving on 1/14 11. HTN:   Cozaar 25 started on 1/5, increased to 50 on 1/10, increased to 75 on 1/14 12. AKI: Resolved  Creatinine 0.84 on 1/4 13.  Hypoalbuminemia  Supplement initiated on 1/4 14. Leukocytosis: Resolved  WBCs 10.4 on 1/10  Afebrile  Continue to monitor 15.  Acute blood loss anemia  Hemoglobin 9.7 on 1/14  Continue to monitor  14.  AMS  EEG unremarkable for seizures  CT/MRI unchanged  UA relatively unremarkable  Appears to be back to baseline on 1/14-per report patient has tendency to wax/wane PTA  1/15: reviewed SLP note and she performed better with cognitive tasks today 15. TSH is elevated to 5.384,  was normal 2 weeks ago. Likely elevated due to TBI: repeat level with T3 and T4 in 3 months outpatient.   LOS: 12 days A FACE TO FACE EVALUATION WAS PERFORMED  [name] 09/07/2020, 4:33 PM

## 2020-09-07 NOTE — Progress Notes (Signed)
Physical Therapy Session Note  Patient Details  Name: [name] MRN: [id number] Date of Birth: 02/15/1918  Today's Date: 09/07/2020 PT Individual Time: 7829-5621 and 3086-5784 PT Individual Time Calculation (min): 13 min and 34 min   and  Today's Date: 09/07/2020 PT Missed Time: 32 Minutes Missed Time Reason: Patient fatigue;Pain  Short Term Goals: Week 2:  PT Short Term Goal 1 (Week 2): STG = LTG due to ELOS  Skilled Therapeutic Interventions/Progress Updates:    Session 1: Pt received sitting in w/c, asleep but easily awakens to verbal stimulus. Upon awakening pt is visibly shaking stating she is cold - therapist readjusted the blankets on her lap to around her shoulders for improved warmth. Pt starts complaining of significant B LE ankle pain reporting she believes it is from walking yesterday stating "I think I over did it." Removed pt's socks and noticed some generalized swelling around the distal lower legs and ankles bilaterally but does not appear to be of musculoskeletal origin and no bruising or erythema present. Had pt perform gentle ankle DF/PF AROM x8 reps each LE with pt immediately moaning in pain upon performing on L with decreased ROM but no signs of increased pain when performing on R. Pt spontaneously states "I want to go home" and becomes emotional - therapist provided comfort and educated on upcoming D/C date. Pt primarily lucid but would have fluctuating moments of confusion randomly stating "Do I have the room cleaned up?" in response to therapist inquiring about performing therapeutic activities. Pt continues to report pain and requests to rest at this time but declines assistance back to bed - upon therapist exiting the room pt asks "can I go to sleep now?" Pt left sitting in w/c with needs in reach and seat belt alarm on. Missed 32 minutes of skilled physical therapy.  Session 2: Pt received asleep, sitting in w/c and upon awakening continues to have some  confusion/disorientation with pt inquiring "where am I?" Therapist reoriented pt and provided calming strategies. Pt not reporting pain at this time and is agreeable to therapy session.  Transported to/from gym in w/c for time management and energy conservation. Sit>stand w/c>PFRW 2x with min assist for lifting and max cuing for proper UE placement during transfer - pt continues to demo fear of falling with increased anxiety upon coming to stand requiring calming strategies and cuing for hand placement on AD. During transfer pt reports back pain although states it feels good to stand up. Pt suddenly reports need to use bathroom. Transported back to room. L stand pivot w/c>BSC over toilet using PFRW with min assist for lifting to stand and CGA/min assist for steadying/balance while turning - continues to demo fear of falling and anxiety during transfer requiring cuing for sequencing and proper hand placement. Standing with B UE support on PFRW during total assist LB clothing management and peri-care - continent of bladder. R stand pivot back to w/c as described above though with pt reporting onset of L ankle pain during this transfer. Pt requesting to remain in w/c - left with needs in reach and seat belt alarm on.   Therapy Documentation Precautions:  Precautions Precautions: Fall Precaution Comments: left radial head fracture with wrist cock up: NWB through wrist and hand Restrictions Weight Bearing Restrictions: Yes LUE Weight Bearing: Non weight bearing LLE Weight Bearing: Non weight bearing Other Position/Activity Restrictions: L radial head fx  Pain:   Session 1: Reports B LE ankle pain (L>R) with RN aware reporting recent application  of Voltaren gel - RN planning to discuss with MD regarding other oral medication options for pain management.  Session 2: Reports back pain and L ankle pain, unrated, provided seated rest breaks for pain management.    Therapy/Group: Individual Therapy  Tawana Scale , PT, DPT, CSRS  09/07/2020, 7:54 AM

## 2020-09-08 NOTE — Progress Notes (Signed)
[place] PHYSICAL MEDICINE & REHABILITATION PROGRESS NOTE  Subjective/Complaints: Sleeping this morning No issues overnight Diastolic BP low, other vitals stable  ROS: Denies CP, SOB, N/V/D, pain  Objective: Vital Signs: Blood pressure (!) 130/53, pulse 66, temperature 98.4 F (36.9 C), temperature source Oral, resp. rate 20, height 5' (1.524 m), weight 70.8 kg, SpO2 100 %. No results found. Recent Labs    09/06/20 0521  WBC 9.7  HGB 9.7*  HCT 28.9*  PLT 272   Recent Labs    09/06/20 0521  NA 141  K 3.8  CL 105  CO2 25  GLUCOSE 115*  BUN 19  CREATININE 0.85  CALCIUM 8.3*    Intake/Output Summary (Last 24 hours) at 09/08/2020 1122 Last data filed at 09/08/2020 7412 Gross per 24 hour  Intake 714 ml  Output --  Net 714 ml     Pressure Injury 08/26/20 Breast Lateral;Left;Lower Unstageable - Full thickness tissue loss in which the base of the injury is covered by slough (yellow, tan, gray, green or brown) and/or eschar (tan, brown or black) in the wound bed. (Active)  08/26/20 1430  Location: Breast  Location Orientation: Lateral;Left;Lower  Staging: Unstageable - Full thickness tissue loss in which the base of the injury is covered by slough (yellow, tan, gray, green or brown) and/or eschar (tan, brown or black) in the wound bed.  Wound Description (Comments):   Present on Admission: Yes     Pressure Injury 08/26/20 Arm Anterior;Left;Proximal;Upper Stage 2 -  Partial thickness loss of dermis presenting as a shallow open injury with a red, pink wound bed without slough. (Active)  08/26/20 1430  Location: Arm  Location Orientation: Anterior;Left;Proximal;Upper  Staging: Stage 2 -  Partial thickness loss of dermis presenting as a shallow open injury with a red, pink wound bed without slough.  Wound Description (Comments):   Present on Admission: Yes    Physical Exam: BP (!) 130/53 (BP Location: Right Arm)   Pulse 66   Temp 98.4 F (36.9 C) (Oral)   Resp 20    Ht 5' (1.524 m)   Wt 70.8 kg   SpO2 100%   BMI 30.47 kg/m   Gen: no distress, normal appearing HEENT: oral mucosa pink and moist, NCAT Cardio: Reg rate Chest: normal effort, normal rate of breathing Abd: soft, non-distended Ext: swelling in bilateral ankles. Psych: pleasant, normal affect Skin:  Unstageable ulcer to left breast,?  Improving Small stage II ulcer to left proximal medial arm, unchanged Stage I pressure ulcer to the left axilla Psych: Normal mood.  Confused. Musc: No edema in extremities.  No tenderness in extremities. Neuro: Alert HOH Motor: Grossly 4/5 throughout, left upper extremity with limitations due to pain, unchanged  Assessment/Plan: 1. Functional deficits which require 3+ hours per day of interdisciplinary therapy in a comprehensive inpatient rehab setting.  Physiatrist is providing close team supervision and 24 hour management of active medical problems listed below.  Physiatrist and rehab team continue to assess barriers to discharge/monitor patient progress toward functional and medical goals   Care Tool:  Bathing    Body parts bathed by patient: Chest,Abdomen,Face,Left arm,Front perineal area,Right upper leg,Left upper leg   Body parts bathed by helper: Right lower leg,Left lower leg,Buttocks,Right arm     Bathing assist Assist Level: Moderate Assistance - Patient 50 - 74%     Upper Body Dressing/Undressing Upper body dressing   What is the patient wearing?: Pull over shirt    Upper body assist Assist Level: Moderate  Assistance - Patient 50 - 74%    Lower Body Dressing/Undressing Lower body dressing      What is the patient wearing?: Pants,Incontinence brief     Lower body assist Assist for lower body dressing: Maximal Assistance - Patient 25 - 49%     Toileting Toileting    Toileting assist Assist for toileting: Maximal Assistance - Patient 25 - 49%     Transfers Chair/bed transfer  Transfers assist     Chair/bed  transfer assist level: Minimal Assistance - Patient > 75% Chair/bed transfer assistive device: Other (PFRW)   Locomotion Ambulation   Ambulation assist      Assist level: Moderate Assistance - Patient 50 - 74% Assistive device: Other (comment) (hallway railing R) Max distance: 10   Walk 10 feet activity   Assist     Assist level: Moderate Assistance - Patient - 50 - 74% Assistive device: Other (comment) (hallway railing R)   Walk 50 feet activity   Assist Walk 50 feet with 2 turns activity did not occur: Safety/medical concerns         Walk 150 feet activity   Assist Walk 150 feet activity did not occur: Safety/medical concerns         Walk 10 feet on uneven surface  activity   Assist Walk 10 feet on uneven surfaces activity did not occur: Safety/medical concerns         Wheelchair     Assist Will patient use wheelchair at discharge?: No             Wheelchair 50 feet with 2 turns activity    Assist            Wheelchair 150 feet activity     Assist           Medical Problem List and Plan: 1.Decreased functional build altered mental statussecondary to traumatic SDH/SAH/acute metabolic encephalopathy  Continue CIR 2. Antithrombotics: -DVT/anticoagulation:SCDs Dopplers negative for DVT -antiplatelet therapy: N/A 3. Pain Management:Lidoderm patch  Voltaren gelQID added to right elbow  Appears controlled on 1/15-1/16-continue voltaren gel and kpad.  Monitor increased exertion 4. Mood:Provide emotional support -antipsychotic agents: N/A 5. Neuropsych: This patientis not fully capable of making decisions on herown behalf. 6. Skin/Wound Care:Routine skin checks  Pressure relief to left proximal arm, healing stage II  Left lateral breast with unstagable ulcer-discussed with daughter, unclear chronicity of ulcer- ultrasound ordered to rule out breast CA prior to debridement-discussed with  radiologist.    Limited ultrasound negative for underlying malignancy    Will consider debridement if no improvement  Left stage I axillary wound 7. Fluids/Electrolytes/Nutrition:Routine in and outs  -encourage appropriate PO 8. Left mildly displaced fracture of the radial head and neck.  -Conservative care.  -Continue to follow Dr. Lorin Mercy as an outpatient.   Patient may use platform walker with elbow splint at 90 degrees per Ortho-we will follow-up with Ortho regarding ability to remove, continue to await further recs 9. Hyperlipidemia.  Lipitor on hold due to elevated LFTs, will hopefully be able to resume next week 10.  Transaminitis.  -recent RUQ [place] suggestive of background liver disease  LFTs elevated, but improving on 1/14 11. HTN:   Cozaar 25 started on 1/5, increased to 50 on 1/10, increased to 75 on 1/14  1/16: BP better controlled but diastolic soft- continue to monitor.  12. AKI: Resolved  Creatinine 0.84 on 1/4 13.  Hypoalbuminemia  Supplement initiated on 1/4 14. Leukocytosis: Resolved  WBCs 10.4 on 1/10  Afebrile  Continue to monitor 15.  Acute blood loss anemia  Hemoglobin 9.7 on 1/14  Continue to monitor  14.  AMS  EEG unremarkable for seizures  CT/MRI unchanged  UA relatively unremarkable  Appears to be back to baseline on 1/14-per report patient has tendency to wax/wane PTA  1/15: reviewed SLP note and she performed better with cognitive tasks today 15. TSH is elevated to 5.384, was normal 2 weeks ago. Likely elevated due to TBI: repeat level with T3 and T4 in 3 months outpatient.  16. Swelling in bilateral ankles with pain: placed nursing order to elevate legs and apply compression garment. Patient already feeling cold so will not add ice.  LOS: 13 days A FACE TO FACE EVALUATION WAS PERFORMED  Sarena Jezek P Kynadi Dragos 09/08/2020, 11:22 AM

## 2020-09-09 ENCOUNTER — Inpatient Hospital Stay (HOSPITAL_COMMUNITY): Payer: Medicare Other | Admitting: Occupational Therapy

## 2020-09-09 ENCOUNTER — Inpatient Hospital Stay (HOSPITAL_COMMUNITY): Payer: Medicare Other

## 2020-09-09 NOTE — Progress Notes (Signed)
Speech Language Pathology Daily Session Note  Patient Details  Name: [name] MRN: [id number] Date of Birth: May 02, 1918  Today's Date: 09/09/2020 SLP Individual Time: 9702-6378 SLP Individual Time Calculation (min): 57 min  Short Term Goals: Week 2: SLP Short Term Goal 1 (Week 2): STG=LTG due to short ELOS  Skilled Therapeutic Interventions:Skilled ST services focused on cognitive skills. Pt was orientated to self, place and time except for day/date which required semantic cues. Pt consumed when prompted that what left untouched on tray table. SLP provided set up assist and cut meat. Pt demonstrated sustained attention in 15 minute interval for self-feeding, however demonstrated x2 overt aspiration coughing on large bites while still adding more food into mouth. SLP cued pt to consumed smaller bites and clear oral cavity prior to next bite, pt demonstrated mod I with no further overt s/s aspiration. Pt's swallow appeared timely and oral cavity was clear of residue after initial cues. SLP facilitated recall and basic problem solving of ADL steps. Pt required mod A verbal cues to list 4 steps for washing you face. Pt continued with distracted and disorganized thought following exercise. SLP attempted to refocus pt in sequencing 3 step picture cards for daiy tasks. Pt was able to sequence cards when given 2 choices with min A verbal cues, but required max A verbal cues to sequence 3 cards at a time. Pt required mod A verbal cues for sustained attention to task during problem solving/sequencing tasks. Pt was left in room with call bell within reach and chair alarm set. SLP recommends to continue skilled services.     Pain Pain Assessment Pain Score: 0-No pain  Therapy/Group: Individual Therapy  [name]  [facility] 09/09/2020, 4:15 PM

## 2020-09-09 NOTE — Progress Notes (Signed)
Physical Therapy Session Note  Patient Details  Name: [name] MRN: [id number] Date of Birth: 02/19/1918  Today's Date: 09/09/2020 PT Individual Time: 0845-1000 PT Individual Time Calculation (min): 75 min   Short Term Goals: Week 2:  PT Short Term Goal 1 (Week 2): STG = LTG due to ELOS  Skilled Therapeutic Interventions/Progress Updates:    Pt received supine in bed, awake and agreeable to therapy. She recognizes me as her therapist but unable to recall my name. Required totalA for threading pants over legs and maxA for pulling them up to her hips. Supine<>sit with HOB elevated with minA and required modA for forward scooting at EOB. Sit<>stand with modA from low EOB height and required minA for standing balance while she pulled pants over hips. Pt then requesting to perform self care tasks at the sink such as oral care, washing her face and combing hair.   Sit<>stand with minA to [place] and she ambulating with CGA and PFRW sinkside. She was able to tolerate standing for 13 minutes with CGA (leaning hips on counter top) while completing these tasks. She required setupA for all tasks and had trouble visual scanning to locate toiletries (toothpaste, hair comb, toothbrush, etc). After she sat down, she reported need to void. Ambulated with minA and PFRW to bathroom (required minA due to increased anxiety/flustered with tiight spaces). Required maxA for managing lower body dressing and minA for controlled lowering to 3-1 BSC. Pt continent of bowel and bladder, charted. Sit<>stand with minA to [place] and she required totalA for posterior peri care but able to maintain standing balance with CGA and PFRW support. Ambulated back to her w/c with minA and PFRW with cues for improving proximity to AD and postural awareness. W/c transport to main therapy gym for energy conservation.  Stand<>pivot with minA and PFRW from w/c to mat table. Instructed patient on card matching with repeated sit<>stands  incorporated. She required minA for rising to standing position and CGA for standing balance. She required maxA for cues for locating matching card. She completed x8 cards prior to reporting fatigue. Seated rest breaks provided intermittently and blanket needed as pt somewhat perseverative on being cold.  Gait training ~167ft with CGA/minA and PFRW from main therapy gym back to her room. Demo's short shuffling steps with forward flexed posture. Cues for improving gait deficits and postural awareness, as well as safety awareness with PFRW management. Stand>sit with minA to w/c and she remained seated in w/c with needs in reach and safety belt alarm on, made comfortable and pt appreciative.   Therapy Documentation Precautions:  Precautions Precautions: Fall Precaution Comments: left radial head fracture with wrist cock up: NWB through wrist and hand Restrictions Weight Bearing Restrictions: Yes LUE Weight Bearing: Non weight bearing LLE Weight Bearing: Non weight bearing Other Position/Activity Restrictions: L radial head fx  Therapy/Group: Individual Therapy  [name] P [name] PT 09/09/2020, 7:37 AM

## 2020-09-09 NOTE — Progress Notes (Signed)
[facility] PHYSICAL MEDICINE & REHABILITATION PROGRESS NOTE  Subjective/Complaints:  Pt reports LBM this AM- denies pain- ate 75% or more of breakfast.   No other issues  ROS:  Pt denies SOB, abd pain, CP, N/V/C/D, and vision changes   Objective: Vital Signs: Blood pressure (!) 152/57, pulse 65, temperature 98.5 F (36.9 C), temperature source Oral, resp. rate 18, height 5' (1.524 m), weight 70.8 kg, SpO2 97 %. No results found. No results for input(s): WBC, HGB, HCT, PLT in the last 72 hours. No results for input(s): NA, K, CL, CO2, GLUCOSE, BUN, CREATININE, CALCIUM in the last 72 hours.  Intake/Output Summary (Last 24 hours) at 09/09/2020 1045 Last data filed at 09/08/2020 2049 Gross per 24 hour  Intake 397 ml  Output -  Net 397 ml     Pressure Injury 08/26/20 Breast Lateral;Left;Lower Unstageable - Full thickness tissue loss in which the base of the injury is covered by slough (yellow, tan, gray, green or brown) and/or eschar (tan, brown or black) in the wound bed. (Active)  08/26/20 1430  Location: Breast  Location Orientation: Lateral;Left;Lower  Staging: Unstageable - Full thickness tissue loss in which the base of the injury is covered by slough (yellow, tan, gray, green or brown) and/or eschar (tan, brown or black) in the wound bed.  Wound Description (Comments):   Present on Admission: Yes     Pressure Injury 08/26/20 Arm Anterior;Left;Proximal;Upper Stage 2 -  Partial thickness loss of dermis presenting as a shallow open injury with a red, pink wound bed without slough. (Active)  08/26/20 1430  Location: Arm  Location Orientation: Anterior;Left;Proximal;Upper  Staging: Stage 2 -  Partial thickness loss of dermis presenting as a shallow open injury with a red, pink wound bed without slough.  Wound Description (Comments):   Present on Admission: Yes    Physical Exam: BP (!) 152/57 (BP Location: Right Arm)   Pulse 65   Temp 98.5 F (36.9 C) (Oral)   Resp 18    Ht 5' (1.524 m)   Wt 70.8 kg   SpO2 97%   BMI 30.47 kg/m   Gen: no distress, normal appearing- sitting up in bed, picking at rest of breakfast, NAD HEENT: oral mucosa pink and moist, NCAT Cardio: RRR Chest: CTA B/L- no W/R/R- good air movement Abd: Soft, NT, ND, (+)BS  Ext: swelling in bilateral ankles. Psych: pleasant, normal affect- slightly anxious; pleasantly confused Skin:  Unstageable ulcer to left breast,?  Improving Small stage II ulcer to left proximal medial arm, unchanged Stage I pressure ulcer to the left axilla Musc: No edema in extremities.  No tenderness in extremities. Neuro: Alert HOH Motor: Grossly 4/5 throughout, left upper extremity with limitations due to pain, unchanged  Assessment/Plan: 1. Functional deficits which require 3+ hours per day of interdisciplinary therapy in a comprehensive inpatient rehab setting.  Physiatrist is providing close team supervision and 24 hour management of active medical problems listed below.  Physiatrist and rehab team continue to assess barriers to discharge/monitor patient progress toward functional and medical goals   Care Tool:  Bathing    Body parts bathed by patient: Chest,Abdomen,Face,Left arm,Front perineal area,Right upper leg,Left upper leg   Body parts bathed by helper: Right lower leg,Left lower leg,Buttocks,Right arm     Bathing assist Assist Level: Moderate Assistance - Patient 50 - 74%     Upper Body Dressing/Undressing Upper body dressing   What is the patient wearing?: Pull over shirt    Upper body assist Assist  Level: Moderate Assistance - Patient 50 - 74%    Lower Body Dressing/Undressing Lower body dressing      What is the patient wearing?: Pants,Incontinence brief     Lower body assist Assist for lower body dressing: Maximal Assistance - Patient 25 - 49%     Toileting Toileting    Toileting assist Assist for toileting: Maximal Assistance - Patient 25 - 49%      Transfers Chair/bed transfer  Transfers assist     Chair/bed transfer assist level: Minimal Assistance - Patient > 75% Chair/bed transfer assistive device: Other (PFRW)   Locomotion Ambulation   Ambulation assist      Assist level: Moderate Assistance - Patient 50 - 74% Assistive device: Other (comment) (hallway railing R) Max distance: 10   Walk 10 feet activity   Assist     Assist level: Moderate Assistance - Patient - 50 - 74% Assistive device: Other (comment) (hallway railing R)   Walk 50 feet activity   Assist Walk 50 feet with 2 turns activity did not occur: Safety/medical concerns         Walk 150 feet activity   Assist Walk 150 feet activity did not occur: Safety/medical concerns         Walk 10 feet on uneven surface  activity   Assist Walk 10 feet on uneven surfaces activity did not occur: Safety/medical concerns         Wheelchair     Assist Will patient use wheelchair at discharge?: No             Wheelchair 50 feet with 2 turns activity    Assist            Wheelchair 150 feet activity     Assist           Medical Problem List and Plan: 1.Decreased functional build altered mental statussecondary to traumatic SDH/SAH/acute metabolic encephalopathy  Continue CIR 2. Antithrombotics: -DVT/anticoagulation:SCDs Dopplers negative for DVT -antiplatelet therapy: N/A 3. Pain Management:Lidoderm patch  Voltaren gelQID added to right elbow  Appears controlled on 1/15-1/16-continue voltaren gel and kpad.  Monitor increased exertion 4. Mood:Provide emotional support -antipsychotic agents: N/A 5. Neuropsych: This patientis not fully capable of making decisions on herown behalf. 6. Skin/Wound Care:Routine skin checks  Pressure relief to left proximal arm, healing stage II  Left lateral breast with unstagable ulcer-discussed with daughter, unclear chronicity of ulcer-  ultrasound ordered to rule out breast CA prior to debridement-discussed with radiologist.    Limited ultrasound negative for underlying malignancy    Will consider debridement if no improvement  Left stage I axillary wound 7. Fluids/Electrolytes/Nutrition:Routine in and outs  -encourage appropriate PO 8. Left mildly displaced fracture of the radial head and neck.  -Conservative care.  -Continue to follow Dr. Lorin Mercy as an outpatient.   Patient may use platform walker with elbow splint at 90 degrees per Ortho-we will follow-up with Ortho regarding ability to remove, continue to await further recs 9. Hyperlipidemia.  Lipitor on hold due to elevated LFTs, will hopefully be able to resume next week 10.  Transaminitis.  -recent RUQ [place] suggestive of background liver disease  LFTs elevated, but improving on 1/14  1/17- stable to slightly trending downwards- con't to monitor 11. HTN:   Cozaar 25 started on 1/5, increased to 50 on 1/10, increased to 75 on 1/14  1/16: BP better controlled but diastolic soft- continue to monitor.   1/17- BP a little elevated today- will monitor before changing- just  occurred.  12. AKI: Resolved  Creatinine 0.84 on 1/4 13.  Hypoalbuminemia  Supplement initiated on 1/4 14. Leukocytosis: Resolved  WBCs 10.4 on 1/10  Afebrile  Continue to monitor 15.  Acute blood loss anemia  Hemoglobin 9.7 on 1/14  Continue to monitor  14.  AMS  EEG unremarkable for seizures  CT/MRI unchanged  UA relatively unremarkable  Appears to be back to baseline on 1/14-per report patient has tendency to wax/wane PTA  1/15: reviewed SLP note and she performed better with cognitive tasks today 15. TSH is elevated to 5.384, was normal 2 weeks ago. Likely elevated due to TBI: repeat level with T3 and T4 in 3 months outpatient.  16. Swelling in bilateral ankles with pain: placed nursing order to elevate legs and apply compression garment.  Patient already feeling cold so will not add ice.  LOS: 14 days A FACE TO FACE EVALUATION WAS PERFORMED  Shoua Ulloa 09/09/2020, 10:45 AM

## 2020-09-09 NOTE — Progress Notes (Signed)
Occupational Therapy Session Note  Patient Details  Name: [name] MRN: [id number] Date of Birth: 02-01-1918  Today's Date: 09/09/2020 OT Individual Time: 1100-1140 OT Individual Time Calculation (min): 40 min    Short Term Goals: Week 2:  OT Short Term Goal 1 (Week 2): STGs=LTGs due to ELOS  Skilled Therapeutic Interventions/Progress Updates:    Pt sitting up in w/c, with eyes closed and moaning stating intermittently "Ohh, Im so cold".  Pt reports no pain including LUE with orthosis donned.  OT assessed skin LUE and noted intact with no redness.  Dependent donning/doffing splint carefully to keep LUE in safe position completed for skin assessment.  Pt required increased time, repetitive multimodal cueing to donn sweater around back with max assist due to pt with difficulty following simple commands and maintaining eyes closed despite encouragement to open for increased participation.  Pt refusing sinkside self care due to complaints of being cold.  Pt also refusing functional mobility at this time for same reason.  Blanket applied around pts shoulder and lap to increase comfort, pt stating "that's much better, thank you".  Call bell in reach, seat belt alarm on.  Pt missed 20 minutes of treatment session.    Therapy Documentation Precautions:  Precautions Precautions: Fall Precaution Comments: left radial head fracture with wrist cock up: NWB through wrist and hand Restrictions Weight Bearing Restrictions: Yes LUE Weight Bearing: Non weight bearing LLE Weight Bearing: Non weight bearing Other Position/Activity Restrictions: L radial head fx   Therapy/Group: Individual Therapy  Ezekiel Slocumb 09/09/2020, 12:25 PM

## 2020-09-09 NOTE — Plan of Care (Signed)
  Problem: RH Comprehension Communication Goal: LTG Patient will comprehend basic/complex auditory (SLP) Description: LTG: Patient will comprehend basic/complex auditory information with cues (SLP). Flowsheets (Taken 09/09/2020 1603) LTG: Patient will comprehend auditory information with cueing (SLP): (downgraded due to inconsistent progress) Minimal Assistance - Patient > 75% Note: Downgraded due to inconsistent progress   Problem: RH Comprehension Communication Goal: LTG Patient will comprehend basic/complex auditory (SLP) Description: LTG: Patient will comprehend basic/complex auditory information with cues (SLP). Flowsheets (Taken 09/09/2020 1603) LTG: Patient will comprehend auditory information with cueing (SLP): (downgraded due to inconsistent progress) Minimal Assistance - Patient > 75% Note: Downgraded due to inconsistent progress   Problem: RH Comprehension Communication Goal: LTG Patient will comprehend basic/complex auditory (SLP) Description: LTG: Patient will comprehend basic/complex auditory information with cues (SLP). Flowsheets (Taken 09/09/2020 1603) LTG: Patient will comprehend auditory information with cueing (SLP): (downgraded due to inconsistent progress) Minimal Assistance - Patient > 75% Note: Downgraded due to inconsistent progress   Problem: RH Problem Solving Goal: LTG Patient will demonstrate problem solving for (SLP) Description: LTG:  Patient will demonstrate problem solving for basic/complex daily situations with cues  (SLP) Flowsheets (Taken 09/09/2020 1603) LTG Patient will demonstrate problem solving for: (downgrade due to inconsistent progress) Minimal Assistance - Patient > 75% Note: Downgraded due to inconsistent progress   Problem: RH Memory Goal: LTG Patient will use memory compensatory aids to (SLP) Description: LTG:  Patient will use memory compensatory aids to recall biographical/new, daily complex information with cues (SLP) Flowsheets (Taken  09/09/2020 1603) LTG: Patient will use memory compensatory aids to (SLP): (downgraded due to inconsistent progress) Minimal Assistance - Patient > 75% Note: Downgraded due to inconsistent progress   Problem: RH Memory Goal: LTG Patient will follow step by step directions w/cues (SLP) Description: LTG: Patient will follow step by step directions with cues (SLP). Flowsheets (Taken 09/09/2020 1603) LTG: Patient will follow step by step directions w/cues: (downgraded due to inconsistent progress) Minimal Assistance - Patient > 75% Note: Downgraded due to inconsistent progress

## 2020-09-09 NOTE — Plan of Care (Signed)
  Problem: RH Balance Goal: LTG: Patient will maintain dynamic sitting balance (OT) Description: LTG:  Patient will maintain dynamic sitting balance with assistance during activities of daily living (OT) Flowsheets (Taken 09/09/2020 1229) LTG: Pt will maintain dynamic sitting balance during ADLs with: Contact Guard/Touching assist (Downgraded due to pt making slow progress) Goal: LTG Patient will maintain dynamic standing with ADLs (OT) Description: LTG:  Patient will maintain dynamic standing balance with assist during activities of daily living (OT)  Flowsheets (Taken 09/09/2020 1232) LTG: Pt will maintain dynamic standing balance during ADLs with: Contact Guard/Touching assist(Downgraded due to pt making slow progress)   Problem: Sit to Stand Goal: LTG:  Patient will perform sit to stand in prep for activites of daily living with assistance level (OT) Description: LTG:  Patient will perform sit to stand in prep for activites of daily living with assistance level (OT) Flowsheets (Taken 09/09/2020 1232) LTG: PT will perform sit to stand in prep for activites of daily living with assistance level: Minimal Assistance - Patient > 75%(Downgraded due to pt making slow progress)   Problem: RH Grooming Goal: LTG Patient will perform grooming w/assist,cues/equip (OT) Description: LTG: Patient will perform grooming with assist, with/without cues using equipment (OT) Flowsheets (Taken 09/09/2020 1232) LTG: Pt will perform grooming with assistance level of: Supervision/Verbal cueing(Downgraded due to pt making slow progress)   Problem: RH Dressing Goal: LTG Patient will perform upper body dressing (OT) Description: LTG Patient will perform upper body dressing with assist, with/without cues (OT). Flowsheets (Taken 09/09/2020 1232) LTG: Pt will perform upper body dressing with assistance level of: Moderate Assistance - Patient 50 - 74%(Downgraded due to pt making slow progress) Goal: LTG Patient will  perform lower body dressing w/assist (OT) Description: LTG: Patient will perform lower body dressing with assist, with/without cues in positioning using equipment (OT) Flowsheets (Taken 09/09/2020 1232) LTG: Pt will perform lower body dressing with assistance level of: Moderate Assistance - Patient 50 - 74%(Downgraded due to pt making slow progress)   Problem: RH Toileting Goal: LTG Patient will perform toileting task (3/3 steps) with assistance level (OT) Description: LTG: Patient will perform toileting task (3/3 steps) with assistance level (OT)  Flowsheets (Taken 09/09/2020 1232) LTG: Pt will perform toileting task (3/3 steps) with assistance level: Moderate Assistance - Patient 50 - 74%(Downgraded due to pt making slow progress)   Problem: RH Toilet Transfers Goal: LTG Patient will perform toilet transfers w/assist (OT) Description: LTG: Patient will perform toilet transfers with assist, with/without cues using equipment (OT) Flowsheets (Taken 09/09/2020 1232) LTG: Pt will perform toilet transfers with assistance level of: Contact Guard/Touching assist(Downgraded due to pt making slow progress)   Problem: RH Tub/Shower Transfers Goal: LTG Patient will perform tub/shower transfers w/assist (OT) Description: LTG: Patient will perform tub/shower transfers with assist, with/without cues using equipment (OT) Flowsheets (Taken 09/09/2020 1232) LTG: Pt will perform tub/shower stall transfers with assistance level of: Minimal Assistance - Patient > 75%(Downgraded due to pt making slow progress)

## 2020-09-10 ENCOUNTER — Inpatient Hospital Stay (HOSPITAL_COMMUNITY): Payer: Medicare Other

## 2020-09-10 ENCOUNTER — Inpatient Hospital Stay (HOSPITAL_COMMUNITY): Payer: Medicare Other | Admitting: Occupational Therapy

## 2020-09-10 ENCOUNTER — Encounter (INDEPENDENT_AMBULATORY_CARE_PROVIDER_SITE_OTHER): Payer: Medicare Other | Admitting: Ophthalmology

## 2020-09-10 DIAGNOSIS — S065X0A Traumatic subdural hemorrhage without loss of consciousness, initial encounter: Secondary | ICD-10-CM

## 2020-09-10 NOTE — Progress Notes (Signed)
Occupational Therapy Session Note  Patient Details  Name: [name] MRN: [id number] Date of Birth: April 18, 1918  Today's Date: 09/10/2020 OT Individual Time: 1000-1100 OT Individual Time Calculation (min): 60 min    Short Term Goals: Week 2:  OT Short Term Goal 1 (Week 2): STGs=LTGs due to ELOS  Skilled Therapeutic Interventions/Progress Updates:    Pt sitting up in w/c, requiring some encouragement to participate in sinkside self care due to mild confusion.  Pt reporting pain in right knee during functional transfer and no pain at rest, unable to provide number on 0-10 scale.  Pt required step by step simple multimodal cues to sequence and problem solve during bathing and dressing.  Pt required mod assist overall to complete UB dressing and bathing primarily due to impaired cognition and presence of left wrist and elbow splint limiting pts functional use of this extremity.  Pt required max assist to complete LB dressing and mod assist to bathe LB with step by step multmodal cues for sequencing and problem solving as well as tactile cues to prevent pt weightbearing through left wrist/hand.  Pt requesting to use bathroom.  Pt transported using w/c and completed stand pivot with min assist and hand held w/c <>3in1 commode. Continent of urine. Toileting completed with min assist including pericare and clothing mgt.  Pt sitting up in w/c, call bell in reach, seat belt alarm on at end of session.    Therapy Documentation Precautions:  Precautions Precautions: Fall Precaution Comments: left radial head fracture with wrist cock up: NWB through wrist and hand Restrictions Weight Bearing Restrictions: Yes LUE Weight Bearing: Non weight bearing LLE Weight Bearing: Non weight bearing Other Position/Activity Restrictions: L radial head fx   Therapy/Group: Individual Therapy  Ezekiel Slocumb 09/10/2020, 12:33 PM

## 2020-09-10 NOTE — Progress Notes (Signed)
Patient ID: [name], female   DOB: [birth year], 87 y.o.   MRN: [id number]  Scheduled family education with daughter tomorrow at 11:30 and 1:45. In preparation for discharge Thursday.

## 2020-09-10 NOTE — Progress Notes (Signed)
[facility] PHYSICAL MEDICINE & REHABILITATION PROGRESS NOTE  Subjective/Complaints: Patient seen sitting up in bed this morning.  She states she slept well overnight.  She appears comfortable.  She states she wants to get ready for the day, but is in no rush.  ROS: Denies CP, SOB, N/V/D  Objective: Vital Signs: Blood pressure (!) 153/64, pulse 67, temperature 98.5 F (36.9 C), temperature source Oral, resp. rate 20, height 5' (1.524 m), weight 70.8 kg, SpO2 98 %. No results found. No results for input(s): WBC, HGB, HCT, PLT in the last 72 hours. No results for input(s): NA, K, CL, CO2, GLUCOSE, BUN, CREATININE, CALCIUM in the last 72 hours.  Intake/Output Summary (Last 24 hours) at 09/10/2020 0854 Last data filed at 09/10/2020 0731 Gross per 24 hour  Intake 365 ml  Output -  Net 365 ml     Pressure Injury 08/26/20 Breast Lateral;Left;Lower Unstageable - Full thickness tissue loss in which the base of the injury is covered by slough (yellow, tan, gray, green or brown) and/or eschar (tan, brown or black) in the wound bed. (Active)  08/26/20 1430  Location: Breast  Location Orientation: Lateral;Left;Lower  Staging: Unstageable - Full thickness tissue loss in which the base of the injury is covered by slough (yellow, tan, gray, green or brown) and/or eschar (tan, brown or black) in the wound bed.  Wound Description (Comments):   Present on Admission: Yes     Pressure Injury 08/26/20 Arm Anterior;Left;Proximal;Upper Stage 2 -  Partial thickness loss of dermis presenting as a shallow open injury with a red, pink wound bed without slough. (Active)  08/26/20 1430  Location: Arm  Location Orientation: Anterior;Left;Proximal;Upper  Staging: Stage 2 -  Partial thickness loss of dermis presenting as a shallow open injury with a red, pink wound bed without slough.  Wound Description (Comments):   Present on Admission: Yes    Physical Exam: BP (!) 153/64 (BP Location: Right Arm)   Pulse 67    Temp 98.5 F (36.9 C) (Oral)   Resp 20   Ht 5' (1.524 m)   Wt 70.8 kg   SpO2 98%   BMI 30.47 kg/m   Constitutional: No distress . Vital signs reviewed. HENT: Normocephalic.  Atraumatic. Eyes: EOMI. No discharge. Cardiovascular: No JVD.  RRR. Respiratory: Normal effort.  No stridor.  Bilateral clear to auscultation. GI: Non-distended.  BS +. Skin: Warm and dry.   Unstageable ulcer to left breast,?  Improving Small stage II ulcer to left proximal medial arm, improving Stage I pressure ulcer to the left axilla Neuro: Alert HOH Motor: Grossly 4/5 throughout, left upper extremity with limitations due to pain, unchanged  Assessment/Plan: 1. Functional deficits which require 3+ hours per day of interdisciplinary therapy in a comprehensive inpatient rehab setting.  Physiatrist is providing close team supervision and 24 hour management of active medical problems listed below.  Physiatrist and rehab team continue to assess barriers to discharge/monitor patient progress toward functional and medical goals   Care Tool:  Bathing    Body parts bathed by patient: Chest,Abdomen,Face,Left arm,Front perineal area,Right upper leg,Left upper leg   Body parts bathed by helper: Right lower leg,Left lower leg,Buttocks,Right arm     Bathing assist Assist Level: Moderate Assistance - Patient 50 - 74%     Upper Body Dressing/Undressing Upper body dressing   What is the patient wearing?: Pull over shirt    Upper body assist Assist Level: Moderate Assistance - Patient 50 - 74%    Lower Body  Dressing/Undressing Lower body dressing      What is the patient wearing?: Pants,Incontinence brief     Lower body assist Assist for lower body dressing: Maximal Assistance - Patient 25 - 49%     Toileting Toileting    Toileting assist Assist for toileting: Maximal Assistance - Patient 25 - 49%     Transfers Chair/bed transfer  Transfers assist     Chair/bed transfer assist level:  Minimal Assistance - Patient > 75% Chair/bed transfer assistive device: Other (PFRW)   Locomotion Ambulation   Ambulation assist      Assist level: Moderate Assistance - Patient 50 - 74% Assistive device: Other (comment) (hallway railing R) Max distance: 10   Walk 10 feet activity   Assist     Assist level: Moderate Assistance - Patient - 50 - 74% Assistive device: Other (comment) (hallway railing R)   Walk 50 feet activity   Assist Walk 50 feet with 2 turns activity did not occur: Safety/medical concerns         Walk 150 feet activity   Assist Walk 150 feet activity did not occur: Safety/medical concerns         Walk 10 feet on uneven surface  activity   Assist Walk 10 feet on uneven surfaces activity did not occur: Safety/medical concerns         Wheelchair     Assist Will patient use wheelchair at discharge?: No             Wheelchair 50 feet with 2 turns activity    Assist            Wheelchair 150 feet activity     Assist           Medical Problem List and Plan: 1.Decreased functional build altered mental statussecondary to traumatic SDH/SAH/acute metabolic encephalopathy  Continue CIR 2. Antithrombotics: -DVT/anticoagulation:SCDs Dopplers negative for DVT -antiplatelet therapy: N/A 3. Pain Management:Lidoderm patch  Voltaren gelQID added to right elbow  Continue voltaren gel and kpad.  Controlled on 1/18  Monitor increased exertion 4. Mood:Provide emotional support -antipsychotic agents: N/A 5. Neuropsych: This patientis not fully capable of making decisions on herown behalf. 6. Skin/Wound Care:Routine skin checks  Pressure relief to left proximal arm, healing stage II  Left lateral breast with unstagable ulcer-discussed with daughter, unclear chronicity of ulcer- ultrasound ordered to rule out breast CA prior to debridement-discussed with radiologist.    Limited  ultrasound negative for underlying malignancy    Will consider debridement if no improvement  Left stage I axillary wound 7. Fluids/Electrolytes/Nutrition:Routine in and outs  -encourage appropriate PO 8. Left mildly displaced fracture of the radial head and neck.  -Conservative care.  -Continue to follow Dr. Lorin Mercy as an outpatient.   Patient may use platform walker with elbow splint at 90 degrees per Ortho-we will follow-up with Ortho regarding ability to remove, continue to await further recs 9. Hyperlipidemia.  Lipitor on hold due to elevated LFTs, will hopefully be able to resume this week 10.  Transaminitis.  -recent RUQ [place] suggestive of background liver disease  LFTs elevated, but improving on 1/14, labs ordered for tomorrow  1/17- stable to slightly trending downwards- con't to monitor 11. HTN:   Cozaar 25 started on 1/5, increased to 50 on 1/10, increased to 75 on 1/14  Labile on 1/18, monitor for trend 12. AKI: Resolved  Creatinine 0.84 on 1/4 13.  Hypoalbuminemia  Supplement initiated on 1/4 14. Leukocytosis: Resolved  WBCs 10.4 on 1/10  Afebrile  Continue to monitor 15.  Acute blood loss anemia  Hemoglobin 9.7 on 1/14, labs ordered for tomorrow  Continue to monitor  14.  AMS-baseline fluctuations  EEG unremarkable for seizures  CT/MRI unchanged  UA relatively unremarkable  Appears to be back to baseline on 1/14-per report patient has tendency to wax/wane PTA 15. TSH is elevated to 5.384, was normal 2 weeks ago. Likely elevated due to TBI: repeat level with T3 and T4 in 3 months outpatient.   LOS: 15 days A FACE TO FACE EVALUATION WAS PERFORMED  Ankit Lorie Phenix 09/10/2020, 8:54 AM

## 2020-09-10 NOTE — Discharge Summary (Addendum)
Physician Discharge Summary  Patient ID: [name] MRN: [id number] DOB/AGE: 1918-06-28 87 y.o.  Admit date: 08/26/2020 Discharge date: 09/12/2020  Discharge Diagnoses:  Principal Problem:   Traumatic subdural hematoma ([place]) Active Problems:   Hypoalbuminemia due to protein-calorie malnutrition [facility])   Essential hypertension   Dyslipidemia   Transaminitis   Closed displaced fracture of head of right radius   Pressure injury of skin   Leukocytosis   Labile blood pressure   Pain   Unstageable decubitus ulcer (HCC)   Acute blood loss anemia Right temporal ICH secondary to hypertensive crisis 2016 CKD stage III  Discharged Condition: Stable  Significant Diagnostic Studies: DG Chest 1 View  Result Date: 08/17/2020 CLINICAL DATA:  Altered mental status EXAM: CHEST  1 VIEW COMPARISON:  08/12/2020 FINDINGS: Mild cardiomegaly. Atherosclerotic calcification of the aortic knob. Calcification of the mitral annulus. No focal airspace consolidation, pleural effusion, or pneumothorax. IMPRESSION: No active disease. Electronically Signed   By: Davina Poke D.O.   On: 08/17/2020 12:24   DG Elbow 2 Views Left  Result Date: 08/13/2020 CLINICAL DATA:  Elbow pain EXAM: LEFT ELBOW - 2 VIEW COMPARISON:  08/11/2020 FINDINGS: There are changes consistent with prior fracture of the radial head. The fracture fragment is well corticated and likely of a subacute to chronic nature. Small joint effusion is noted new from the prior exam. No other focal abnormality is noted. IMPRESSION: Changes consistent with prior subacute to chronic fracture in the radial head. Small joint effusion is noted. Electronically Signed   By: Inez Catalina M.D.   On: 08/13/2020 13:24   DG Wrist 2 Views Left  Result Date: 08/14/2020 CLINICAL DATA:  One 87 year old female with left wrist pain. EXAM: LEFT WRIST - 2 VIEW COMPARISON:  None. FINDINGS: Old-appearing fracture of the distal radial metaphysis. Clinical  correlation is recommended. No definite acute fracture. The bones are osteopenic. There is no dislocation. Degenerative changes of the wrist as well as osteoarthritic changes of the base of the thumb. There is positive ulnar variance. The soft tissues are unremarkable. IMPRESSION: 1. No definite acute fracture or dislocation. 2. Old-appearing fracture of the distal radial metaphysis. Electronically Signed   By: Anner Crete M.D.   On: 08/14/2020 19:01   CT HEAD WO CONTRAST  Result Date: 08/18/2020 CLINICAL DATA:  Follow-up suspected cerebral hemorrhage EXAM: CT HEAD WITHOUT CONTRAST TECHNIQUE: Contiguous axial images were obtained from the base of the skull through the vertex without intravenous contrast. COMPARISON:  Six days ago FINDINGS: Brain: Small volume subarachnoid hemorrhage along the left frontal convexity, non progressed. There is also small volumes and stable subarachnoid blood at the right occipital lobe. Subdural hematoma with high and low-density components along the left cerebral convexity, with thickness increase in the left parietal region where it measures 8 mm. The increased from prior is mainly low-density. Thin component along the tentorium does not appear progressed. There is slight rightward midline shift which measures 2 mm at most. Small volume intraventricular hemorrhage at the exit ule horn of the left lateral ventricle. Stable encephalomalacia in the lateral right temporal lobe related to large volume hemorrhage based on a 2016 MRI. Right parafalcine meningioma anteriorly and measuring 9 mm. Vascular: No hyperdense vessel or unexpected calcification. Skull: Normal. Negative for fracture or focal lesion. Sinuses/Orbits: No acute finding. IMPRESSION: 1. Subdural hematoma along the left cerebral convexity with increased low-density component in the parietal region. The collection measures up to 8 mm in thickness; 2 mm of midline  shift. 2. Unchanged subarachnoid hemorrhage. Small  volume intraventricular hemorrhage is now seen. Electronically Signed   By: Monte Fantasia M.D.   On: 08/18/2020 08:52   MR BRAIN W WO CONTRAST  Result Date: 09/05/2020 CLINICAL DATA:  Mental status changes. Right facial droop. Acute stroke suspected. EXAM: MRI HEAD WITHOUT AND WITH CONTRAST TECHNIQUE: Multiplanar, multiecho pulse sequences of the brain and surrounding structures were obtained without and with intravenous contrast. CONTRAST:  52mL GADAVIST GADOBUTROL 1 MMOL/ML IV SOLN COMPARISON:  Head CT same day. FINDINGS: Brain: Diffusion imaging does not show any acute or subacute infarction. There chronic small-vessel ischemic changes of the pons. 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 Residual hemosiderin deposition. Subdural hematoma along the left convexity, with some heterogeneity. Maximal thickness in the posterior parietal region is 10 mm. Small amount of subarachnoid blood in a left frontal sulcus simulates infarction on diffusion imaging. No true infarction is identified. No midline shift. 12 mm right para falcine meningioma is unchanged without mass-effect upon the brain. Chronic small-vessel ischemic changes elsewhere affecting the thalami and hemispheric white matter. Vascular: Major vessels at the base of the brain show flow. Skull and upper cervical spine: Negative Sinuses/Orbits: Clear/normal Other: None IMPRESSION: 1. No acute stroke by MRI. 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 Extensive chronic small-vessel ischemic changes throughout the brain as outlined above. 2. Subdural hematoma along the left convexity, maximal thickness 10 mm. No midline shift. Small amount of subarachnoid blood in a left frontal sulcus simulates infarction on diffusion imaging. Electronically Signed   By: Nelson Chimes M.D.   On: 09/05/2020 19:18   CT ELBOW LEFT WO CONTRAST  Result Date:  08/15/2020 CLINICAL DATA:  Elbow pain and swelling after recent fall. Subdural hematoma. EXAM: CT OF THE UPPER LEFT EXTREMITY WITHOUT CONTRAST TECHNIQUE: Multidetector CT imaging of the left elbow was performed according to the standard protocol. COMPARISON:  Left elbow radiographs 08/13/2020 FINDINGS: Bones/Joint/Cartilage There is deformity of the radial head and neck which is greatest anteriorly and laterally, probably reflecting a mildly displaced acute fracture. There is no depression of the articular surface. The radiocapitellar articulation is intact. The distal humerus and proximal ulna are intact. There is mild spurring of coronoid and olecranon processes. Small elbow joint effusion demonstrates intermediate density, likely reflecting hemarthrosis related to acute fracture. No erosive changes or bony destruction. Ligaments Suboptimally assessed by CT. Muscles and Tendons The biceps and triceps tendons appear intact. No focal muscular abnormalities are identified. Soft tissues Dorsal soft tissue swelling without evidence of focal fluid collection, soft tissue emphysema or foreign body. No skin ulceration identified. IMPRESSION: 1. Mildly displaced fracture of the radial head and neck, likely acute. No depression of the articular surface. 2. Associated small left elbow joint hemarthrosis. 3. Dorsal soft tissue swelling without evidence of focal fluid collection, soft tissue emphysema or foreign body. Electronically Signed   By: Richardean Sale M.D.   On: 08/15/2020 14:22   EEG adult  Result Date: 09/05/2020 Lora Havens, MD     09/05/2020  4:48 PM Patient Name: [name] MRN: [id number] Epilepsy Attending: Lora Havens Referring Physician/Provider: Anibal Henderson, NP Date: 09/05/2020 Duration: 23.10 mins Patient history: 87 year old female with recent history of a traumatic left frontal lobe subarachnoid hemorrhage, left frontal parietal subdural hematoma, and a left wrist radial head  fracture after a fall on 08/11/2020 with ongoing complications of confusion, decline in functional  status, and possible delirium. Patient found with persistent and increased confusion, slurred speech, and transient left-sided facial droop. EEG to evaluate for seizure Level of alertness: Awake AEDs during EEG study: None Technical aspects: This EEG study was done with scalp electrodes positioned according to the 10-20 International system of electrode placement. Electrical activity was acquired at a sampling rate of 500Hz  and reviewed with a high frequency filter of 70Hz  and a low frequency filter of 1Hz . EEG data were recorded continuously and digitally stored. Description: The posterior dominant rhythm consists of 8 Hz activity of moderate voltage (25-35 uV) seen predominantly in posterior head regions, symmetric and reactive to eye opening and eye closing. EEG showed intermittent 3 to 6 Hz theta-delta slowing in left frontal region. Sharp waves were also noted in left frontal region. Hyperventilation and photic stimulation were not performed.   ABNORMALITY -Sharp wave,left frontal region -Intermittent slow, left frontal region IMPRESSION: This study showed evidence of epileptogenicity and cortical dysfunction in left frontal region likely secondary to underlying bleed. No seizures were seen throughout the recording. Priyanka Barbra Sarks   [place] CHEST SOFT TISSUE  Result Date: 08/30/2020 CLINICAL DATA:  87 year old female with OUTER LEFT breast wound. Evaluate for underlying mass. Patient with subdural hematoma of from recent fall and with dementia. EXAM: ULTRASOUND OF THE LEFT BREAST COMPARISON:  Previous exam(s). FINDINGS: Targeted ultrasound of the OUTER LEFT breast underlying the patient's wound is performed, showing no sonographic abnormalities in this area. No solid or cystic mass, distortion or abnormal shadowing identified. IMPRESSION: No sonographic abnormality in the OUTER LEFT breast, underlying the  patient's superficial wound. No sonographic evidence of malignancy in this area. Electronically Signed   By: Margarette Canada M.D.   On: 08/30/2020 15:04   CT HEAD CODE STROKE WO CONTRAST`  Result Date: 09/05/2020 CLINICAL DATA:  Code stroke. Mental status change. Right facial droop. EXAM: CT HEAD WITHOUT CONTRAST TECHNIQUE: Contiguous axial images were obtained from the base of the skull through the vertex without intravenous contrast. COMPARISON:  CT head 08/18/2020. FINDINGS: Brain: Previously noted mild anterior subarachnoid hemorrhage on the left has resolved. Small interhemispheric subdural hematoma anteriorly on the right is unchanged. Small parenchymal appearing hemorrhage in the right medial occipital lobe unchanged. Previously noted left-sided subdural hematoma is now lower density. No residual high-density subdural. Left parietal convexity hypodense fluid collection measures 9 mm in thickness with mild mass-effect on the left parietal lobe. This is slightly larger in size. Left tentorial subdural hematoma with interval improvement. Chronic infarct in the right temporal lobe unchanged. Patchy white matter hypodensity bilaterally unchanged. Generalized atrophy. Slight midline shift to the right is unchanged. No hydrocephalus. Vascular: Negative for hyperdense vessel. Atherosclerotic calcification in the cavernous carotid bilaterally. Skull: No acute skeletal abnormality. Sinuses/Orbits: Mild mucosal edema paranasal sinuses. Bilateral cataract extraction. Other: None ASPECTS ([name] Stroke Program Early CT Score) - Ganglionic level infarction (caudate, lentiform nuclei, internal capsule, insula, M1-M3 cortex): 7 - Supraganglionic infarction (M4-M6 cortex): 3 Total score (0-10 with 10 being normal): 10 IMPRESSION: 1. Negative for acute ischemic infarct. 2. ASPECTS is 10 3. Interval improvement in subarachnoid hemorrhage. Left-sided subdural hematoma is slightly larger but now lower in density. This now  measures 9 mm in thickness in the left parietal convexity. 4. Code stroke imaging results were communicated on 09/05/2020 at 1:04 pm to provider Rory Percy via text page Electronically Signed   By: Franchot Gallo M.D.   On: 09/05/2020 13:04   VAS [place] LOWER EXTREMITY VENOUS (DVT)  Result Date: 08/26/2020  Lower Venous DVT Study Indications: Pain.  Comparison Study: no prior Performing Technologist: Abram Sander RVS  Examination Guidelines: A complete evaluation includes B-mode imaging, spectral Doppler, color Doppler, and power Doppler as needed of all accessible portions of each vessel. Bilateral testing is considered an integral part of a complete examination. Limited examinations for reoccurring indications may be performed as noted. The reflux portion of the exam is performed with the patient in reverse Trendelenburg.  +---------+---------------+---------+-----------+----------+-------------------+ RIGHT    CompressibilityPhasicitySpontaneityPropertiesThrombus Aging      +---------+---------------+---------+-----------+----------+-------------------+ CFV      Full           Yes      Yes                                      +---------+---------------+---------+-----------+----------+-------------------+ SFJ      Full                                                             +---------+---------------+---------+-----------+----------+-------------------+ FV Prox  Full                                                             +---------+---------------+---------+-----------+----------+-------------------+ FV Mid   Full                                                             +---------+---------------+---------+-----------+----------+-------------------+ FV DistalFull                                                             +---------+---------------+---------+-----------+----------+-------------------+ PFV      Full                                                              +---------+---------------+---------+-----------+----------+-------------------+ POP      Full           Yes      Yes                                      +---------+---------------+---------+-----------+----------+-------------------+ PTV      Full                                                             +---------+---------------+---------+-----------+----------+-------------------+  PERO                                                  Not well visualized +---------+---------------+---------+-----------+----------+-------------------+   +---------+---------------+---------+-----------+----------+-------------------+ LEFT     CompressibilityPhasicitySpontaneityPropertiesThrombus Aging      +---------+---------------+---------+-----------+----------+-------------------+ CFV      Full           Yes      Yes                                      +---------+---------------+---------+-----------+----------+-------------------+ SFJ      Full                                                             +---------+---------------+---------+-----------+----------+-------------------+ FV Prox  Full                                                             +---------+---------------+---------+-----------+----------+-------------------+ FV Mid   Full                                                             +---------+---------------+---------+-----------+----------+-------------------+ FV DistalFull                                                             +---------+---------------+---------+-----------+----------+-------------------+ PFV      Full                                                             +---------+---------------+---------+-----------+----------+-------------------+ POP      Full           Yes      Yes                                       +---------+---------------+---------+-----------+----------+-------------------+ PTV      Full                                                             +---------+---------------+---------+-----------+----------+-------------------+ PERO  Not well visualized +---------+---------------+---------+-----------+----------+-------------------+     Summary: BILATERAL: - No evidence of deep vein thrombosis seen in the lower extremities, bilaterally. - No evidence of superficial venous thrombosis in the lower extremities, bilaterally. -No evidence of popliteal cyst, bilaterally.   *See table(s) above for measurements and observations. Electronically signed by Ruta Hinds MD on 08/26/2020 at 12:10:21 PM.    Final    US Abdomen Limited RUQ (LIVER/GB)  Result Date: 08/23/2020 CLINICAL DATA:  Increased LFTs. EXAM: ULTRASOUND ABDOMEN LIMITED RIGHT UPPER QUADRANT COMPARISON:  None FINDINGS: Gallbladder: Limited visualization of the gallbladder due to difficulty in patient positioning due to altered mental status and adjacent bowel gas. Cholelithiasis, 8 mm calculus without wall thickening or pericholecystic fluid. No tenderness over the gallbladder reported by the sonographer. Common bile duct: Diameter: 1.8 mm Liver: Coarsened hepatic echotexture is suggested with very limited visualization of much of the RIGHT hepatic lobe. No focal lesion on submitted images. Mildly nodular contour. Visualized portal vein is patent with hepatopetal flow though with limited assessment. Other: None. IMPRESSION: Limited evaluation with findings that suggest background liver disease. If there is further concern for hepatic or biliary abnormality further assessment with cross-sectional imaging either with CT or MR could be considered as warranted. Electronically Signed   By: Zetta Bills M.D.   On: 08/23/2020 15:11    Labs:  Basic Metabolic Panel: Recent Labs  Lab  09/06/20 0521 09/11/20 0631  NA 141 140  K 3.8 3.9  CL 105 105  CO2 25 24  GLUCOSE 115* 116*  BUN 19 24*  CREATININE 0.85 0.92  CALCIUM 8.3* 8.1*    CBC: Recent Labs  Lab 09/06/20 0521 09/11/20 0631  WBC 9.7 8.6  NEUTROABS 7.5 6.5  HGB 9.7* 9.2*  HCT 28.9* 29.2*  MCV 85.8 85.9  PLT 272 251    CBG: Recent Labs  Lab 09/05/20 1313  GLUCAP 77   Family history.  Daughter with breast cancer.  Denies any colon cancer esophageal cancer or rectal cancer  Brief HPI:   [name] is a 87 y.o. right-handed female with history of hypertension, macular degeneration, right temporal ICH secondary to hypertensive crisis 2016, amyloid angiopathy, CKD stage III, PAF.  Patient with recent left frontal lobe SAH left frontal parietal SDH after a fall with left wrist radial head fracture 08/11/2020 with recommendations by orthopedic services for sling and outpatient follow-up by Dr. Lorin Mercy and currently nonweightbearing.  She was discharged home 08/16/2020 from [facility] although skilled nursing facility was recommended but readmitted 08/17/2020 with ongoing confusion lethargy and inability to care for self.  Per chart review she lives with her spouse and daughter.  1 level home 15 steps down to the basement level apartment with chairlift.  They have a caregiver that comes in every morning but the daughter is adding more care in the home as needed.  They have a life alert and multiple safety features in the house.  Patient was using a rolling walker at baseline prior to SDH.  Follow-up cranial CT scan showed increase in left parietal SAH with slight 2 mm rightward midline shift.  Lethargy felt to be multifactorial due to tramadol as well as acute on chronic renal failure.  Admission chemistries glucose 126 BUN 33 creatinine 1.18 TSH 2.786 total CK 36 urinalysis negative nitrite.  Patient was placed on IV fluids for hydration with latest creatinine 0.82.  Neurosurgery consulted advised  conservative care of traumatic SAH/SDH.  Blood pressure monitored.  Findings of  elevated LFTs Lipitor was held.  Tolerating a regular diet.  Due to patient's decrease in functional mobility altered mental status she was admitted for a comprehensive rehab program   Hospital Course: [name] was admitted to rehab 08/26/2020 for inpatient therapies to consist of PT, ST and OT at least three hours five days a week. Past admission physiatrist, therapy team and rehab RN have worked together to provide customized collaborative inpatient rehab.  Pertaining to patient's traumatic SDH SAH acute metabolic encephalopathy remained stable conservative care by neurosurgery.  During her hospital rehabilitation course noted bout of altered mental status EEG unremarkable for seizure CT MRI unchanged urinalysis relatively unremarkable follow-up per neurology services.  Patient returned back to baseline per report by family patient has tendency to wax and wane even prior to admission.  SCDs for DVT prophylaxis venous Doppler studies negative.  Pain managed with use of a Lidoderm patch as well as Voltaren gel.  She had a small pressure relief area to the left proximal arm healing stage II left lateral breast with unstageable ulcer discussed with daughter unclear chronicity of ulcer ultrasound ordered to rule out breast cancer prior to debridement discussed with radiologist Limited ultrasound negative for underlying malignancy.  Left mildly displaced fracture of the radial head and neck conservative care follow-up per Dr. Lorin Mercy patient may use platform rolling walker with elbow splint at 90 degrees.  History of hyperlipidemia Lipitor on hold due to elevated LFTs and would need follow-up outpatient.  Blood pressure monitored on Cozaar.  Acute on chronic anemia hemoglobin 9.2 no bleeding episodes.  Hemoccult stool was negative.  Family had requested follow-up outpatient referral with gastroenterology.  Hypothyroidism noted TSH  level 5.384 likely elevated to recent TBI recommendations repeat level T3 and T4 in 3 months outpatient.   Blood pressures were monitored on TID basis and controlled     Rehab course: During patient's stay in rehab weekly team conferences were held to monitor patient's progress, set goals and discuss barriers to discharge. At admission, patient required minimal assist 5 feet to person hand-held assist moderate assist sit to stand moderate assist supine to sit.  Minimal assist grooming max assist upper body bathing max assist lower body bathing max assist upper body dressing total assist lower body dressing  Physical exam.  Blood pressure 141/63 pulse 64 temperature 98.1 respirations 16 oxygen saturation 97% room air Constitutional.  No acute distress HEENT Head.  Normocephalic and atraumatic Eyes.  Pupils round and reactive to light no discharge without nystagmus Neck.  Supple nontender no JVD without thyromegaly Cardiac regular rate rhythm not any extra sounds or murmur heard Abdomen.  Soft nontender positive bowel sounds without rebound Respiratory effort normal no respiratory distress without wheeze Musculoskeletal.  Left upper extremity in sling Neurologic patient was alert and pleasant she was able to provide her name and answer basic questions.  Fair to borderline insight and awareness.  Moves all 4 extremities except left upper extremity due to orthopedic  He/She  has had improvement in activity tolerance, balance, postural control as well as ability to compensate for deficits. He/She has had improvement in functional use RUE/LUE  and RLE/LLE as well as improvement in awareness.  Patient requires total assist for threading her pants over her legs and max assist for pulling him up to her hips.  Supine to sit head of bed elevated with minimal assist required moderate assist for forward scooting edge of bed.  Sit to stand with moderate assist for low edge  of bed.  She was able to perform  self-care task at the sink side of oral care washing her face and combing her hair.  Sit to stand minimal assist of platform rolling walker then ambulates contact-guard assist with platform rolling walker sink side.  She is able to tolerate standing for 13 minutes.  She did require set up for all tasks and had some trouble visual scanning to locate toiletries.  Required max assist for managing lower body dressing and minimal assist for control and lowering to a 3 and 1 bedside commode.  She was able to increase her ambulation up to 165 feet contact-guard assist to minimal assist platform rolling walker rest breaks as needed.  She was able to communicate her needs.  Oriented to self place and time except needed some cues for date.  Demonstrated sustained attention 15 minutes for self-feeding.  Full family teaching completed plan discharged home       Disposition: Discharge to home    Diet: Regular  Special Instructions: No driving smoking or alcohol  Follow-up thyroid panel 3 months  Follow-up LFTs with PCP  Nonweightbearing left upper extremity to use platform rolling walker with elbow splint at 90 degrees  Medications at discharge 1.  Voltaren gel 2 g 4 times daily to affected area 2.  Lidoderm patch change as directed 3.  Cozaar 75 mg p.o. daily 4.  Multivitamin daily  30-35 minutes were spent completing discharge summary and discharge planning  Discharge Instructions     Ambulatory referral to Physical Medicine Rehab   Complete by: As directed    Moderate complexity follow-up 1 to 2 weeks traumatic SDH/SAH        Follow-up Information     Jamse Arn, MD Follow up.   Specialty: Physical Medicine and Rehabilitation Why: Office to call for appointment Contact information: [place] [address] [phone]         Marybelle Killings, MD Follow up.   Specialty: Orthopedic Surgery Why: Call for appointment Contact information: [place] [address] [phone]         Earnie Larsson, MD Follow up.   Specialty: Neurosurgery Why: Call for appointment Contact information: [address] [place] [address] [phone]                 Signed: Cathlyn Parsons 09/12/2020, 5:53 AM Patient was seen, face-face, and physical exam performed by me on day of discharge, greater than 30 minutes of total time spent.. Please see progress note from day of discharge as well.  Delice Lesch, MD, ABPMR

## 2020-09-10 NOTE — Progress Notes (Signed)
Physical Therapy Session Note  Patient Details  Name: [name] MRN: [id number] Date of Birth: Feb 26, 1918  Today's Date: 09/10/2020 PT Individual Time: 9458-5929 PT Individual Time Calculation (min): 69 min   Short Term Goals: Week 2:  PT Short Term Goal 1 (Week 2): STG = LTG due to ELOS  Skilled Therapeutic Interventions/Progress Updates:    Pt received sitting upright in w/c, agreeable to therapy. No reports of pain. She recognizes me as her therapist but unable to recall my name. Even after telling her my name, unable to recall after ~30 seconds. However, during the middle of our session when asked, she was able to recall appropriately. W/c transport to day room gym for energy conservation.   Performed dynamic standing balance tasks with games incoporated to improve participation. She played bean bag toss with cornhole game and used her RUE for throwing the bags and minA for stability while standing unsupported. She became quite competitive with this game and enjoyed it greatly. She even got one in the hole! She also was tasked to count points for cognitive processing and short term memory recall. She required mod cues for counting points.  Also performed dynamic standing balance with life-size Connect-4, but this game was unfamiliar to her so she required a good amount of coaching. Instructed her on the task and rules but she was unable to remember accurately and ultimately this became more of a balance task than accuracy task.   Gait training x178ft with CGA and PFRW on level surfaces. Continues to demo short shuffling steps with heavy reliance on RW. She also has difficulty maintaining appropriate proximity to her RW and requires frequent cues for improving this. She's able to correct but unable to maintain.   She was returned to her room with [place] in her w/c and she remained seated with safety belt alarm on and her needs within reach. Able to recall my name at end of session. Pt  made comfortable.  Therapy Documentation Precautions:  Precautions Precautions: Fall Precaution Comments: left radial head fracture with wrist cock up: NWB through wrist and hand Restrictions Weight Bearing Restrictions: Yes LUE Weight Bearing: Non weight bearing LLE Weight Bearing: Non weight bearing Other Position/Activity Restrictions: L radial head fx  Therapy/Group: Individual Therapy  Bhavik Cabiness P Sharonda Llamas PT 09/10/2020, 7:35 AM

## 2020-09-10 NOTE — Progress Notes (Signed)
Physical Therapy Session Note  Patient Details  Name: [name] MRN: [id number] Date of Birth: October 25, 1917  Today's Date: 09/10/2020 PT Individual Time: 0800-0855 PT Individual Time Calculation (min): 55 min   Short Term Goals: Week 2:  PT Short Term Goal 1 (Week 2): STG = LTG due to ELOS  Skilled Therapeutic Interventions/Progress Updates:   Patient received asleep in bed, agreeable to PT. She denies pain when asked. Patient able to come sit edge of bed with supervision and verbal cues to maintain NWB to L UE. ModA to don pants in sitting and complete in standing. MaxA to don bra and shirt in sitting. Patient coming to stand with RW and CGA to complete lower body dressing. She was able to transfer to wc via stand pivot with RW and CGA. Patient remains very anxious with mobility and requires explicit cuing to maximize patients comfort with functional mobility. She complete morning ADLs at sink with set up assist and supervision. PT propelling patient in wc to therapy gym for time management and energy conservation. She ambulated 81ft with RW and CGA. Slow gait speed, flexed posture and WBOS maintained. Patient minimally responsive to multimodal cuing to address gait abnormalities. Patient returning to room in wc, seatbelt alarm on, call light within reach.    Therapy Documentation Precautions:  Precautions Precautions: Fall Precaution Comments: left radial head fracture with wrist cock up: NWB through wrist and hand Restrictions Weight Bearing Restrictions: Yes LUE Weight Bearing: Non weight bearing LLE Weight Bearing: Non weight bearing Other Position/Activity Restrictions: L radial head fx    Therapy/Group: Individual Therapy  Karoline Caldwell, PT, DPT, CBIS  09/10/2020, 7:33 AM

## 2020-09-11 ENCOUNTER — Ambulatory Visit (HOSPITAL_COMMUNITY): Payer: Medicare Other

## 2020-09-11 ENCOUNTER — Inpatient Hospital Stay (HOSPITAL_COMMUNITY): Payer: Medicare Other | Admitting: Speech Pathology

## 2020-09-11 ENCOUNTER — Inpatient Hospital Stay (HOSPITAL_COMMUNITY): Payer: Medicare Other | Admitting: Occupational Therapy

## 2020-09-11 ENCOUNTER — Inpatient Hospital Stay (HOSPITAL_COMMUNITY): Payer: Medicare Other

## 2020-09-11 ENCOUNTER — Other Ambulatory Visit (HOSPITAL_COMMUNITY): Payer: Self-pay | Admitting: Physician Assistant

## 2020-09-11 LAB — COMPREHENSIVE METABOLIC PANEL
ALT: 151 U/L — ABNORMAL HIGH (ref 0–44)
AST: 110 U/L — ABNORMAL HIGH (ref 15–41)
Albumin: 2 g/dL — ABNORMAL LOW (ref 3.5–5.0)
Alkaline Phosphatase: 108 U/L (ref 38–126)
Anion gap: 11 (ref 5–15)
BUN: 24 mg/dL — ABNORMAL HIGH (ref 8–23)
CO2: 24 mmol/L (ref 22–32)
Calcium: 8.1 mg/dL — ABNORMAL LOW (ref 8.9–10.3)
Chloride: 105 mmol/L (ref 98–111)
Creatinine, Ser: 0.92 mg/dL (ref 0.44–1.00)
GFR, Estimated: 56 mL/min — ABNORMAL LOW (ref >60)
Glucose, Bld: 116 mg/dL — ABNORMAL HIGH (ref 70–99)
Potassium: 3.9 mmol/L (ref 3.5–5.1)
Sodium: 140 mmol/L (ref 135–145)
Total Bilirubin: 1 mg/dL (ref 0.3–1.2)
Total Protein: 5.5 g/dL — ABNORMAL LOW (ref 6.5–8.1)

## 2020-09-11 LAB — CBC WITH DIFFERENTIAL/PLATELET
Abs Immature Granulocytes: 0.07 10*3/uL (ref 0.00–0.07)
Basophils Absolute: 0 10*3/uL (ref 0.0–0.1)
Basophils Relative: 0 %
Eosinophils Absolute: 0.1 10*3/uL (ref 0.0–0.5)
Eosinophils Relative: 1 %
HCT: 29.2 % — ABNORMAL LOW (ref 36.0–46.0)
Hemoglobin: 9.2 g/dL — ABNORMAL LOW (ref 12.0–15.0)
Immature Granulocytes: 1 %
Lymphocytes Relative: 9 %
Lymphs Abs: 0.8 10*3/uL (ref 0.7–4.0)
MCH: 27.1 pg (ref 26.0–34.0)
MCHC: 31.5 g/dL (ref 30.0–36.0)
MCV: 85.9 fL (ref 80.0–100.0)
Monocytes Absolute: 1.1 10*3/uL — ABNORMAL HIGH (ref 0.1–1.0)
Monocytes Relative: 13 %
Neutro Abs: 6.5 10*3/uL (ref 1.7–7.7)
Neutrophils Relative %: 76 %
Platelets: 251 10*3/uL (ref 150–400)
RBC: 3.4 MIL/uL — ABNORMAL LOW (ref 3.87–5.11)
RDW: 14.5 % (ref 11.5–15.5)
WBC: 8.6 10*3/uL (ref 4.0–10.5)
nRBC: 0 % (ref 0.0–0.2)

## 2020-09-11 MED ORDER — DICLOFENAC SODIUM 1 % EX GEL: 2.0000 g | Freq: Four times a day (QID) | CUTANEOUS | 0 refills | Status: DC

## 2020-09-11 MED ORDER — LOSARTAN POTASSIUM 25 MG PO TABS: 75.0000 mg | ORAL_TABLET | Freq: Every day | ORAL | 0 refills | Status: DC

## 2020-09-11 MED ORDER — LIDOCAINE 5 % EX PTCH: 1.0000 | MEDICATED_PATCH | CUTANEOUS | 0 refills | Status: DC

## 2020-09-11 MED FILL — LOSARTAN POTASSIUM 25 MG TA: 25 | 30 days supply | Qty: 90 | Fill #0

## 2020-09-11 MED FILL — DICLOFENAC SODIUM 1 % GEL: 1 | 16 days supply | Qty: 200 | Fill #0

## 2020-09-11 MED FILL — LIDOCAINE PATCH 5%: 5 | 30 days supply | Qty: 30 | Fill #0

## 2020-09-11 NOTE — Progress Notes (Signed)
Patient ID: [name], female   DOB: 02-04-1918, 87 y.o.   MRN: [id number]  Met with daughter who is here for education for Mom. She will go to the retail store at [place] due to platform is there and she will need to pick it up. She voiced she will and have for discharge tomorrow. She has decided to stay with [facility] even though can not get an aide, but familiar with them since they see her father also. Feels comfortable with discharge tomorrow.

## 2020-09-11 NOTE — Discharge Instructions (Signed)
Inpatient Rehab Discharge Instructions  [name] Discharge date and time: No discharge date for patient encounter.   Activities/Precautions/ Functional Status: Activity: Nonweightbearing left upper extremity use platform rolling walker with elbow splint at 90 degrees Diet: regular diet Wound Care: Routine skin checks Functional status:  ___ No restrictions     ___ Walk up steps independently ___ 24/7 supervision/assistance   ___ Walk up steps with assistance ___ Intermittent supervision/assistance  ___ Bathe/dress independently ___ Walk with walker     _x__ Bathe/dress with assistance ___ Walk Independently    ___ Shower independently ___ Walk with assistance    ___ Shower with assistance ___ No alcohol     ___ Return to work/school ________  Special Instructions: No driving smoking or alcohol  Follow-up outpatient thyroid panel in 3 months as well as monitoring of these.  Hold Lipitor until further notice due to elevated LFTs    COMMUNITY REFERRALS UPON DISCHARGE:    Home Health:   PT, OT, RN                 [place] HDQQI:[phone]   Medical Equipment/Items Ordered:PLATFORM LEFT FOR [name]                                                 Agency/Supplier: [facility]  [phone]  My questions have been answered and I understand these instructions. I will adhere to these goals and the provided educational materials after my discharge from the hospital.  Patient/Caregiver Signature _______________________________ Date __________  Clinician Signature _______________________________________ Date __________  Please bring this form and your medication list with you to all your follow-up doctor's appointments.

## 2020-09-11 NOTE — Discharge Summary (Signed)
Physical Therapy Discharge Summary  Patient Details  Name: [name] MRN: [id number] Date of Birth: 01-Aug-1918  Today's Date: 09/11/2020 PT Individual Time: 8022-3361 + 2244-9753 PT Individual Time Calculation (min): 54 min  + 30 min   Patient has met 1 of 9 long term goals due to improved activity tolerance, improved balance, improved postural control, increased strength, decreased pain, ability to compensate for deficits, improved attention and improved awareness.  Patient to discharge at an ambulatory level [place]. Patient's care partner is independent to provide the necessary physical and cognitive assistance at discharge. Family has hired private care to assist with patient needs and family training has been performed with the daughter to ensure all questions and concerns addressed.  Reasons goals not met: Unfortunately, pt was unable to achieve a few of her long term goals. However, she has made great progress during her rehabilitation. Goals were set for supervision but due to her significant cognitive deficits, LUE NWB restrictions, impaired safety awareness, and high levels of anxiety regarding functional mobility, she required CGA for functional gait and minA for functional transfers.   Recommendation:  Patient will benefit from ongoing skilled PT services in home health setting to continue to advance safe functional mobility, address ongoing impairments in general strength, endurance, balance, gait, and safety awareness in order to minimize fall risk.  Equipment: PFRW  Reasons for discharge: treatment goals met and discharge from hospital  Patient/family agrees with progress made and goals achieved: Yes  PT Discharge Precautions/Restrictions Precautions Precautions: Fall;Other (comment) (L breast wound) Precaution Comments: left radial head fracture. NWB through wrist and hand Restrictions Weight Bearing Restrictions: Yes LUE Weight Bearing: Non weight  bearing Other Position/Activity Restrictions: L radial head fx Vital Signs Therapy Vitals Temp: 100.2 F (37.9 C) Temp Source: Oral Pulse Rate: 73 Resp: 20 BP: (!) 143/71 Patient Position (if appropriate): Lying Oxygen Therapy SpO2: 98 % O2 Device: Room Air Vision/Perception  Perception Perception: Impaired Praxis Praxis Impairment Details: Motor planning;Perseveration;Initiation;Ideomotor  Cognition Overall Cognitive Status: Impaired/Different from baseline Arousal/Alertness: Awake/alert Orientation Level: Oriented to person;Oriented to place;Oriented to situation;Disoriented to time Attention: Sustained;Selective Sustained Attention: Impaired Sustained Attention Impairment: Functional basic;Functional complex Selective Attention: Impaired Selective Attention Impairment: Verbal basic;Functional basic Memory: Impaired Memory Impairment: Decreased recall of new information;Decreased short term memory;Retrieval deficit Decreased Short Term Memory: Verbal basic;Functional basic;Functional complex Awareness: Impaired Problem Solving: Impaired Problem Solving Impairment: Functional basic Sequencing: Impaired Safety/Judgment: Impaired Sensation Sensation Light Touch: Appears Intact Hot/Cold: Appears Intact Proprioception: Appears Intact Stereognosis: Not tested Coordination Gross Motor Movements are Fluid and Coordinated: No Fine Motor Movements are Fluid and Coordinated: No Coordination and Movement Description: Limited by cognitive deficits and LUE NWB restrictions Motor  Motor Motor: Motor apraxia Motor - Skilled Clinical Observations: Significantly anxious and fearful of movement.  Mobility Bed Mobility Bed Mobility: Sit to Supine;Supine to Sit Rolling Right: Minimal Assistance - Patient > 75% Rolling Left: Minimal Assistance - Patient > 75% Supine to Sit: Minimal Assistance - Patient > 75% Sit to Supine: Minimal Assistance - Patient > 75% Transfers Transfers:  Sit to Stand;Stand Pivot Transfers Sit to Stand: Minimal Assistance - Patient > 75% Stand to Sit: Minimal Assistance - Patient > 75% Stand Pivot Transfers: Minimal Assistance - Patient > 75% Stand Pivot Transfer Details: Verbal cues for safe use of DME/AE;Verbal cues for gait pattern;Verbal cues for technique;Verbal cues for precautions/safety;Verbal cues for sequencing;Tactile cues for initiation;Tactile cues for weight beaing;Tactile cues for posture;Tactile cues for sequencing;Visual cues/gestures for precautions/safety;Visual cues for safe  use of DME/AE;Visual cues/gestures for sequencing Transfer (Assistive device): Left platform walker Locomotion  Gait Ambulation: Yes Gait Assistance: Contact Guard/Touching assist Gait Distance (Feet): 150 Feet Assistive device: Left platform walker Gait Assistance Details: Verbal cues for safe use of DME/AE;Verbal cues for precautions/safety;Verbal cues for technique;Verbal cues for gait pattern;Tactile cues for posture;Tactile cues for weight beaing;Tactile cues for placement;Tactile cues for sequencing;Verbal cues for sequencing Gait Gait: Yes Gait Pattern: Impaired Gait Pattern: Step-to pattern;Decreased step length - right;Decreased step length - left;Decreased stance time - right;Decreased stance time - left;Decreased stride length;Decreased hip/knee flexion - right;Decreased hip/knee flexion - left;Decreased weight shift to right;Decreased weight shift to left;Trunk flexed;Poor foot clearance - left;Poor foot clearance - right;Shuffle;Narrow base of support Gait velocity: decreased Stairs / Additional Locomotion Stairs: No Wheelchair Mobility Wheelchair Mobility: No  Trunk/Postural Assessment  Cervical Assessment Cervical Assessment: Within Functional Limits Thoracic Assessment Thoracic Assessment: Exceptions to [facility] (thoracic kyphosis) Lumbar Assessment Lumbar Assessment: Exceptions to [facility] (post pelvic tilt) Postural Control Postural  Control: Within Functional Limits  Balance Balance Balance Assessed: Yes Static Sitting Balance Static Sitting - Balance Support: Feet supported Static Sitting - Level of Assistance: 5: Stand by assistance Dynamic Sitting Balance Dynamic Sitting - Balance Support: Feet supported;During functional activity Dynamic Sitting - Level of Assistance: 5: Stand by assistance Static Standing Balance Static Standing - Balance Support: Bilateral upper extremity supported Static Standing - Level of Assistance: 5: Stand by assistance Dynamic Standing Balance Dynamic Standing - Balance Support: During functional activity Dynamic Standing - Level of Assistance: 4: Min assist Extremity Assessment      RLE Assessment RLE Assessment: Exceptions to [facility] General Strength Comments: Grossly 4-/5 LLE Assessment LLE Assessment: Exceptions to [facility] General Strength Comments: Grossly 4-/5  Skilled Intervention:  1st session: Pt received sitting upright in w/c, agreeable to therapy. Again, unable to recall my name but recognizes me. She reports mild R knee pain, unrated. Mobility and rest breaks and redirection provided for relief. Pt requesting to get dressed for the day. She required modA for getting her pants on in sitting (totalA for threading), required minA for sit<>stand and able to pull pants over her hips with modA. She required maxA for removing her night gown and maxA for donning a sweater. Increased assist needed due to the bulkiness of her L arm splint. W/c transport to therapy gym to perform car transfer. Car transfer performed with minA, primarily assist needed for safety approach and rising out of the car. She was able to manage her LE's indep and car height was set to a small sedan. Ambulated ~22f with CGA and PFRW to ADL apartment room to perform furniture transfers. She requires modA for rising from soft sofa couch that height is lower than her w/c. Discussed with her strategies for home safety and  setting herself up for success. Will further discuss this with scheduled family education next session. Ambulated 1531fwith CGA and PFRW back to her w/c and she was [place]. Reached out to CSW to make sure DME is ordered. Pt Remained seated in w/c with safety belt alarm on and needs in reach, made comfortable.   2nd session: Pt received siting upright in w/c, agreeable to therapy. Daughter at bedside for family training. Lengthy discussion held with patient and daughter regarding home safety training, DME rec's, role of f/u therapies, LUE NWB restrictions, progressions made with therapy, and importance of strict 24/7 supervision at DC. Daughter appears to have good understanding of  precautions and home safety awareness, private care has been hired for the assist needed. All questions and concerns addressed. Daughter deferred hands on training for the car as she feels comfortable with this, spoke with her regarding steps and she voiced good understanding. Pt performed sit<>stand with minA to [place] and she ambulated ~171f with CGA and PFRW with daughter present for education on guarding techniques and pt keeping proximity to AD. Pt remained seated in w/c with needs in reach at end of session and daughter present.   Taesha Goodell P Tawnee Clegg PT, DPT 09/11/2020, 7:37 AM

## 2020-09-11 NOTE — Progress Notes (Signed)
Occupational Therapy Discharge Summary  Patient Details  Name: [name] MRN: [id number] Date of Birth: 09/29/1917  Today's Date: 09/11/2020 OT Individual Time: 1336-1430 OT Individual Time Calculation (min): 54 min    Patient has met 7 of 11 long term goals due to improved activity tolerance, improved balance and ability to compensate for deficits.  Patient to discharge at overall Mod Assist level.  Patient's care partner is independent to provide the necessary physical and cognitive assistance at discharge.    Reasons goals not met: Barriers to meeting goals include impaired cognition, presence of left arm splint with NWB precautions, significant anxiety and fear of falling.    Recommendation:  Patient will benefit from ongoing skilled OT services in home health setting to continue to advance functional skills in the area of BADL. Pt would also benefit from skin assessment LUE per splint precautions.    Equipment: No equipment provided  Reasons for discharge: treatment goals met and discharge from hospital  Patient/family agrees with progress made and goals achieved: Yes   Skilled Intervention:  Pt sitting up in w/c, no c/o pain, agreeable to OT session.  Pt reporting "yes" when prompted for toileting.  Pt ambulated from w/c to bathroom using platform walker with min assist.  Pt continent of urine.  Min assist required for clothing mgt and pericare.  Pt completed toilet transfer at 3 in 1 commode with min assist.  Pt returned to w/c with min assist as dtr arrived for caregiver education.  Educated dtr regarding splint wear schedule and precautions, and importance for ortho follow up to address LUE.  Educated pts dtr regarding pts current functional status for bathing, dressing, toileting, and grooming as well as functional mobility.  Pts dtr reported good understanding of all education and actively participated throughout session.  Pt sitting in w/c, call bell in reach, seat alarm  on.  OT Discharge Precautions/Restrictions  Precautions Precautions: Fall;Other (comment) Precaution Comments: left radial head fracture. NWB through wrist and hand Required Braces or Orthoses: Splint/Cast Splint/Cast: left elbow/wrist orthosis with elbow at 90 degrees flexion Restrictions Weight Bearing Restrictions: Yes LUE Weight Bearing: Non weight bearing Other Position/Activity Restrictions: L radial head fx Pain Pain Assessment Pain Scale: 0-10 Pain Score: 0-No pain ADL ADL Grooming: Supervision/safety Where Assessed-Grooming: Sitting at sink Upper Body Bathing: Moderate assistance Where Assessed-Upper Body Bathing: Sitting at sink,Wheelchair Lower Body Bathing: Moderate assistance Where Assessed-Lower Body Bathing: Sitting at sink,Wheelchair Upper Body Dressing: Moderate assistance Where Assessed-Upper Body Dressing: Sitting at sink,Wheelchair Lower Body Dressing: Maximal assistance Where Assessed-Lower Body Dressing: Wheelchair Toileting: Minimal assistance Where Assessed-Toileting: Bedside Commode Toilet Transfer: Minimal assistance Toilet Transfer Method: Counselling psychologist: Bedside commode ADL Comments: patient anxious and perseverates on being cold, improved attention to task once dressed Vision Baseline Vision/History: Macular Degeneration;Wears glasses Wears Glasses: At all times Patient Visual Report: No change from baseline Vision Assessment?: No apparent visual deficits Perception  Perception: Within Functional Limits Praxis Praxis: Impaired Praxis Impairment Details: Motor planning;Perseveration;Initiation;Ideomotor Cognition Overall Cognitive Status: Impaired/Different from baseline Arousal/Alertness: Awake/alert Orientation Level: Oriented to person;Oriented to place;Oriented to situation Attention: Sustained;Selective Sustained Attention: Impaired Sustained Attention Impairment: Functional basic Selective Attention:  Impaired Selective Attention Impairment: Functional basic Memory Impairment: Decreased recall of new information;Decreased short term memory;Retrieval deficit Decreased Short Term Memory: Functional basic Awareness Impairment: Intellectual impairment Problem Solving: Impaired Problem Solving Impairment: Functional basic Executive Function: Sequencing;Decision Making Sequencing: Impaired Sequencing Impairment: Functional basic Decision Making: Impaired Decision Making Impairment: Functional basic Safety/Judgment: Impaired Sensation  Sensation Light Touch: Appears Intact Hot/Cold: Appears Intact Proprioception: Appears Intact Stereognosis: Not tested Coordination Gross Motor Movements are Fluid and Coordinated: No Fine Motor Movements are Fluid and Coordinated: No Finger Nose Finger Test: slow movements Motor  Motor Motor: Motor apraxia Motor - Skilled Clinical Observations: Significantly anxious and fearful of movement. Mobility  Transfers Sit to Stand: Minimal Assistance - Patient > 75% Stand to Sit: Minimal Assistance - Patient > 75%  Trunk/Postural Assessment  Cervical Assessment Cervical Assessment: Within Functional Limits Thoracic Assessment Thoracic Assessment: Exceptions to [facility] (kyphosis) Lumbar Assessment Lumbar Assessment: Exceptions to [facility] (posterior pelvic tilt) Postural Control Postural Control: Within Functional Limits  Balance Balance Balance Assessed: Yes Static Sitting Balance Static Sitting - Balance Support: Feet supported Static Sitting - Level of Assistance: 5: Stand by assistance Dynamic Sitting Balance Dynamic Sitting - Balance Support: Feet supported;During functional activity Dynamic Sitting - Level of Assistance: 5: Stand by assistance Static Standing Balance Static Standing - Balance Support: Bilateral upper extremity supported Static Standing - Level of Assistance: 5: Stand by assistance Dynamic Standing Balance Dynamic Standing - Balance  Support: During functional activity Dynamic Standing - Level of Assistance: 4: Min assist Extremity/Trunk Assessment RUE Assessment RUE Assessment: Within Functional Limits LUE Assessment Active Range of Motion (AROM) Comments: shoulder WFL; elbow, wrist not tested per precautions; digits WFL General Strength Comments: NT per precautions   Caryl Asp Namrata Dangler 09/11/2020, 4:36 PM

## 2020-09-11 NOTE — Patient Care Conference (Signed)
Inpatient RehabilitationTeam Conference and Plan of Care Update Date: 09/11/2020   Time: 11:20 AM    Patient Name: [name]      Medical Record Number: [id number]  Date of Birth: [birth year] Sex: Female         Room/Bed: 4M08C/4M08C-01 Payor Info: Payor: Theme park manager MEDICARE / Plan: UHC MEDICARE / Product Type: *No Product type* /    Admit Date/Time:  08/26/2020  2:57 PM  Primary Diagnosis:  Traumatic subdural hematoma [facility])  Hospital Problems: Principal Problem:   Traumatic subdural hematoma ([place]) Active Problems:   Hypoalbuminemia due to protein-calorie malnutrition ([place])   Essential hypertension   Dyslipidemia   Transaminitis   Closed displaced fracture of head of right radius   Pressure injury of skin   Leukocytosis   Labile blood pressure   Pain   Unstageable decubitus ulcer ([place])   Acute blood loss anemia    Expected Discharge Date: Expected Discharge Date: 09/12/20  Team Members Present: Physician leading conference: Dr. Delice Lesch Care Coodinator Present: Ovidio Kin, LCSW;Jara Feider Hervey Ard, RN, BSN, CRRN Nurse Present: Rayne Du, LPN PT Present: Ginnie Smart, PT OT Present: Leretha Pol, OT SLP Present: Nadara Mode, SLP PPS Coordinator present : [name], SLP     Current Status/Progress Goal Weekly Team Focus  Bowel/Bladder   pt cont of b and b lbm- 09/09/20  remain cont of b and b  assess q shift and prn   Swallow/Nutrition/ Hydration             ADL's   min-modA for self care and functional transfers  downgraded to min/modA  adl training, safety awareness, functional transfers training, orthosis fitting/training left elbow splint   Mobility             Communication             Safety/Cognition/ Behavioral Observations            Pain   pt has no complaints of pain but faces score shows 6/10 pain on right elbow and him and left side. Pt refuses pain medication  decrease the amount of pain  assess q shift and prn, utilize  volteren gel at schedualed times. assist with repositioning   Skin   pt has unstagebale wound on left breast  decrease th eammount of skin breakdown  assess q shift and prn     Discharge Planning:  Daughter coming in for education today in anticaption of discharge tomorrow. Has hired Geologist, engineering for 14 days for transition home. Aware will need 24/7 care   Team Discussion: BP fluctuating but stable. LFTs elevated; MD adjusting medications. Hgb trending down; MD rechecking labs. Progress limited by anxiety.  Patient on target to meet rehab goals: yes, currently mod-max assist with ADLs and min-mod assist for transfers. Able to ambulate with CGA using platform walker.  *See Care Plan and progress notes for long and short-term goals.   Revisions to Treatment Plan:   Teaching Needs: Transfers, toileting, medications, skin care, etc  Current Barriers to Discharge: Decreased caregiver support  Possible Resolutions to Barriers: Family education Hired caregivers    Medical Summary Current Status: Decreased functional build altered mental status secondary to traumatic SDH/SAH/acute metabolic encephalopathy  Barriers to Discharge: Medical stability;Other (comments);Wound care;Behavior;Decreased family/caregiver support;Weight bearing restrictions  Barriers to Discharge Comments: recent left radial fracture Possible Resolutions to [employer] Focus: Therapies, follow labs - LFTs, optimize BP, continue to await Ortho recs regarding need for splint, monitor wounds, patient/family/caregiver education  Continued Need for Acute Rehabilitation Level of Care: The patient requires daily medical management by a physician with specialized training in physical medicine and rehabilitation for the following reasons: Direction of a multidisciplinary physical rehabilitation program to maximize functional independence : Yes Medical management of patient stability for increased activity during  participation in an intensive rehabilitation regime.: Yes Analysis of laboratory values and/or radiology reports with any subsequent need for medication adjustment and/or medical intervention. : Yes   I attest that I was present, lead the team conference, and concur with the assessment and plan of the team.   Dorien Chihuahua B 09/11/2020, 3:50 PM

## 2020-09-11 NOTE — Progress Notes (Signed)
Inpatient Rehabilitation Care Coordinator Discharge Note  The overall goal for the admission was met for:   Discharge location: Yes-HOME WITH HUSBAND AND DAUGHTER'S FAMILY  Length of Stay: Yes-17 DAYS  Discharge activity level: Yes-SUPERVISION-MIN SOME MOD FOR ADL'S  Home/community participation: Yes  Services provided included: MD, RD, PT, OT, SLP, RN, CM, Pharmacy and SW  Financial Services: Private Insurance: [place] offered to/list presented to:YES  Follow-up services arranged: Home Health: [facility]-PT,OT,RN, DME: [facility]-L-PLATFORM FOR RW and Patient/Family request agency HH: PREF DUE TO [place], DME: NO PREF  Comments (or additional information):DAUGHTER WAS IN FOR EDUCATION YESTERDAY AND IT WENT WELL AWARE PT WILL NEED 24/7 CARE AT HOME. HAS HIRED COMFORT KEEPERS TO PROVIDE THIS AT DC  Patient/Family verbalized understanding of follow-up arrangements: Yes  Individual responsible for coordination of the follow-up plan: JOAN-DAUGHTER [phone]  Confirmed correct DME delivered: Elease Hashimoto 09/11/2020    Shamiracle Gorden, Gardiner Rhyme

## 2020-09-11 NOTE — Progress Notes (Signed)
Speech Language Pathology Discharge Summary  Patient Details  Name: TERILYNN BURESH MRN: [id number] Date of Birth: 03/09/1918  Today's Date: 09/11/2020 SLP Individual Time: 0800-0900 SLP Individual Time Calculation (min): 60 min   Skilled Therapeutic Interventions:  Patient seen to address cognitive-linguistic goals. Patient appeared a little disoriented by change in schedule as SLP usually sees her after she has completed her morning routine. She was in bed in her nightgown. Patient able to transfer from bed to [facility] with minA for safety and to not bear weight on left UE. Patient then completed tooth brushing, washed face at sink(sitting in [facility] and briefly standing). She then requested to use bathroom and started to become anxious that we were not going to make it in time. Patient required a lot of reassurance and cues to redirect her attention to safety as she was getting flustered and anxious about various aspects (worried she had an accident in brief, etc). Patient to be discharged home next date with daughter and [facility] aid providing 24 hour supervision and assistance.      Patient has met 3 of 5 long term goals.  Patient to discharge at overall Min;Supervision level.  Reasons goals not met: slower than anticipated progress, patient's anxiety   Clinical Impression/Discharge Summary: Patient made slow progress overall but was able to meet 3/5 LTG's and is being discharged at supervision to [facility] level for basic level problem solving and ADL performance. Patient's anxiety and fear did impact her progress related to multi step problem solving and recall, ability to return demonstrate to perform ADL's as instructed and safety awareness. Patient is able to communicate her basic needs/wants/thoughts and does demonstrate some awareness to her physical deficits but not cognitive ones. She will benefit from [facility] SLP to continue to work with her regarding cognitive-linguistic goals, as well as 24 hour supervision.   Care Partner:  Caregiver Able to Provide Assistance: Yes  Type of Caregiver Assistance: Physical;Cognitive  Recommendation:  24 hour supervision/assistance;Home Health SLP  Rationale for SLP Follow Up: Maximize functional communication;Maximize cognitive function and independence;Reduce caregiver burden   Equipment: N/A   Reasons for discharge: Discharged from hospital   Patient/Family Agrees with Progress Made and Goals Achieved: Yes    Sonia Baller, MA, CCC-SLP Speech Therapy

## 2020-09-11 NOTE — Progress Notes (Addendum)
[place] PHYSICAL MEDICINE & REHABILITATION PROGRESS NOTE  Subjective/Complaints: Patient seen sitting up, working with therapies this AM.  She states she slept well overnight.   ROS: Denies CP, SOB, N/V/D  Objective: Vital Signs: Blood pressure (!) 143/71, pulse 73, temperature 100.2 F (37.9 C), temperature source Oral, resp. rate 20, height 5' (1.524 m), weight 70.8 kg, SpO2 98 %. No results found. Recent Labs    09/11/20 0631  WBC 8.6  HGB 9.2*  HCT 29.2*  PLT 251   No results for input(s): NA, K, CL, CO2, GLUCOSE, BUN, CREATININE, CALCIUM in the last 72 hours.  Intake/Output Summary (Last 24 hours) at 09/11/2020 0917 Last data filed at 09/11/2020 [id number] Gross per 24 hour  Intake 600 ml  Output --  Net 600 ml     Pressure Injury 08/26/20 Breast Lateral;Left;Lower Unstageable - Full thickness tissue loss in which the base of the injury is covered by slough (yellow, tan, gray, green or brown) and/or eschar (tan, brown or black) in the wound bed. (Active)  08/26/20 1430  Location: Breast  Location Orientation: Lateral;Left;Lower  Staging: Unstageable - Full thickness tissue loss in which the base of the injury is covered by slough (yellow, tan, gray, green or brown) and/or eschar (tan, brown or black) in the wound bed.  Wound Description (Comments):   Present on Admission: Yes     Pressure Injury 08/26/20 Arm Anterior;Left;Proximal;Upper Stage 2 -  Partial thickness loss of dermis presenting as a shallow open injury with a red, pink wound bed without slough. (Active)  08/26/20 1430  Location: Arm  Location Orientation: Anterior;Left;Proximal;Upper  Staging: Stage 2 -  Partial thickness loss of dermis presenting as a shallow open injury with a red, pink wound bed without slough.  Wound Description (Comments):   Present on Admission: Yes    Physical Exam: BP (!) 143/71 (BP Location: Right Arm)   Pulse 73   Temp 100.2 F (37.9 C) (Oral)   Resp 20   Ht 5' (1.524 m)    Wt 70.8 kg   SpO2 98%   BMI 30.47 kg/m   Constitutional: No distress . Vital signs reviewed. HENT: Normocephalic.  Atraumatic. Eyes: EOMI. No discharge. Cardiovascular: No JVD.  RRR. Respiratory: Normal effort.  No stridor.  Bilateral clear to auscultation. GI: Non-distended.  BS +. Skin: Warm and dry.   Unstageable ulcer to left breast, improving Small stage II ulcer to left proximal medial arm, improving Stage I pressure ulcer to the left axilla Psych: Normal mood.  Normal behavior. Musc: No edema in extremities.  No tenderness in extremities. Neuro: Alert HOH Motor: Grossly 4/5 throughout, left upper extremity with limitations due to pain, stable  Assessment/Plan: 1. Functional deficits which require 3+ hours per day of interdisciplinary therapy in a comprehensive inpatient rehab setting.  Physiatrist is providing close team supervision and 24 hour management of active medical problems listed below.  Physiatrist and rehab team continue to assess barriers to discharge/monitor patient progress toward functional and medical goals   Care Tool:  Bathing    Body parts bathed by patient: Chest,Abdomen,Face,Left arm,Front perineal area,Right upper leg,Left upper leg   Body parts bathed by helper: Right lower leg,Left lower leg,Buttocks,Right arm     Bathing assist Assist Level: Moderate Assistance - Patient 50 - 74%     Upper Body Dressing/Undressing Upper body dressing   What is the patient wearing?: Bra,Pull over shirt    Upper body assist Assist Level: Moderate Assistance - Patient 50 - 74%  Lower Body Dressing/Undressing Lower body dressing      What is the patient wearing?: Pants,Incontinence brief     Lower body assist Assist for lower body dressing: Maximal Assistance - Patient 25 - 49%     Toileting Toileting    Toileting assist Assist for toileting: Minimal Assistance - Patient > 75%     Transfers Chair/bed transfer  Transfers assist      Chair/bed transfer assist level: Contact Guard/Touching assist Chair/bed transfer assistive device: Programmer, multimedia   Ambulation assist      Assist level: Contact Guard/Touching assist Assistive device: Walker-platform Max distance: 80   Walk 10 feet activity   Assist     Assist level: Contact Guard/Touching assist Assistive device: Walker-platform   Walk 50 feet activity   Assist Walk 50 feet with 2 turns activity did not occur: Safety/medical concerns  Assist level: Contact Guard/Touching assist Assistive device: Walker-platform    Walk 150 feet activity   Assist Walk 150 feet activity did not occur: Safety/medical concerns         Walk 10 feet on uneven surface  activity   Assist Walk 10 feet on uneven surfaces activity did not occur: Safety/medical concerns         Wheelchair     Assist Will patient use wheelchair at discharge?: No             Wheelchair 50 feet with 2 turns activity    Assist            Wheelchair 150 feet activity     Assist           Medical Problem List and Plan: 1.Decreased functional build altered mental statussecondary to traumatic SDH/SAH/acute metabolic encephalopathy  Continue CIR  Team conference today to discuss current and goals and coordination of care, home and environmental barriers, and discharge planning with nursing, case manager, and therapies. Please see conference note from today as well.  2. Antithrombotics: -DVT/anticoagulation:SCDs Dopplers negative for DVT -antiplatelet therapy: N/A 3. Pain Management:Lidoderm patch  Voltaren gelQID added to right elbow  Continue voltaren gel and kpad.  Controlled on 1/19  Monitor increased exertion 4. Mood:Provide emotional support -antipsychotic agents: N/A 5. Neuropsych: This patientis not fully capable of making decisions on herown behalf. 6. Skin/Wound Care:Routine skin  checks  Pressure relief to left proximal arm, healing stage II  Left lateral breast with unstagable ulcer-discussed with daughter, unclear chronicity of ulcer- ultrasound ordered to rule out breast CA prior to debridement-discussed with radiologist.    Limited ultrasound negative for underlying malignancy    Will consider debridement to expedite healing  Left stage I axillary wound 7. Fluids/Electrolytes/Nutrition:Routine in and outs  -encourage appropriate PO 8. Left mildly displaced fracture of the radial head and neck.  -Conservative care.  -Continue to follow Dr. Lorin Mercy as an outpatient.   Patient may use platform walker with elbow splint at 90 degrees per Ortho-we will follow-up with Ortho regarding ability to remove, continue to await further recs 9. Hyperlipidemia.  Lipitor on hold due to elevated LFTs, will hopefully be able to resume this week 10.  Transaminitis.  -recent RUQ [place] suggestive of background liver disease  LFTs elevated, but improving on 1/14, labs pending 11. HTN:   Cozaar 25 started on 1/5, increased to 50 on 1/10, increased to 75 on 1/14  Elevated, but improving on 1/19 12. AKI: Resolved  Creatinine 0.84 on 1/4 13.  Hypoalbuminemia  Supplement initiated on 1/4 14. Leukocytosis: Resolved  WBCs 10.4 on 1/10  Afebrile  Continue to monitor 15.  Acute blood loss anemia  Hemoglobin 9.2 on 1/14  Hemoccult ordered  Continue to monitor  14.  AMS-baseline fluctuations  EEG unremarkable for seizures  CT/MRI unchanged  UA relatively unremarkable  Appears to be back to baseline on 1/14-per report patient has tendency to wax/wane PTA 15. TSH is elevated to 5.384, was normal 2 weeks ago. Likely elevated due to TBI: repeat level with T3 and T4 in 3 months outpatient.   LOS: 16 days A FACE TO FACE EVALUATION WAS PERFORMED  [name] Lorie Phenix 09/11/2020, 9:17 AM

## 2020-09-12 LAB — CBC
HCT: 28.7 % — ABNORMAL LOW (ref 36.0–46.0)
Hemoglobin: 9.2 g/dL — ABNORMAL LOW (ref 12.0–15.0)
MCH: 27.5 pg (ref 26.0–34.0)
MCHC: 32.1 g/dL (ref 30.0–36.0)
MCV: 85.9 fL (ref 80.0–100.0)
Platelets: 262 10*3/uL (ref 150–400)
RBC: 3.34 MIL/uL — ABNORMAL LOW (ref 3.87–5.11)
RDW: 14.5 % (ref 11.5–15.5)
WBC: 9.4 10*3/uL (ref 4.0–10.5)
nRBC: 0 % (ref 0.0–0.2)

## 2020-09-12 LAB — OCCULT BLOOD X 1 CARD TO LAB, STOOL: Fecal Occult Bld: NEGATIVE

## 2020-09-12 NOTE — Progress Notes (Signed)
Patient discharged off of unit with all belongings. Discharge papers/instructions explained by physician assistant to family. Patient and family have no further questions at time of discharge. No complications noted at this time.  [name]  

## 2020-09-12 NOTE — Plan of Care (Signed)
  Problem: Consults Goal: RH BRAIN INJURY PATIENT EDUCATION Description: Description: See Patient Education module for eduction specifics Outcome: Completed/Met   Problem: RH BOWEL ELIMINATION Goal: RH STG MANAGE BOWEL WITH ASSISTANCE Description: STG Manage Bowel with Mod I Assistance. Outcome: Completed/Met Goal: RH STG MANAGE BOWEL W/MEDICATION W/ASSISTANCE Description: STG Manage Bowel with Medication with Mod I Assistance. Outcome: Completed/Met   Problem: RH BLADDER ELIMINATION Goal: RH STG MANAGE BLADDER WITH ASSISTANCE Description: STG Manage Bladder With Mod I Assistance Outcome: Completed/Met   Problem: RH SAFETY Goal: RH STG ADHERE TO SAFETY PRECAUTIONS W/ASSISTANCE/DEVICE Description: STG Adhere to Safety Precautions With Mod I Assistance/Device. Outcome: Completed/Met Goal: RH STG DECREASED RISK OF FALL WITH ASSISTANCE Description: STG Decreased Risk of Fall With Mod I Assistance. Outcome: Completed/Met   Problem: RH COGNITION-NURSING Goal: RH STG USES MEMORY AIDS/STRATEGIES W/ASSIST TO PROBLEM SOLVE Description: STG Uses Memory Aids/Strategies With Assistance to Problem Solve. Outcome: Completed/Met   Problem: RH PAIN MANAGEMENT Goal: RH STG PAIN MANAGED AT OR BELOW PT'S PAIN GOAL Description: <2 Outcome: Completed/Met   Problem: RH KNOWLEDGE DEFICIT BRAIN INJURY Goal: RH STG INCREASE KNOWLEDGE OF SELF CARE AFTER BRAIN INJURY Outcome: Completed/Met

## 2020-09-12 NOTE — Progress Notes (Signed)
[facility] PHYSICAL MEDICINE & REHABILITATION PROGRESS NOTE  Subjective/Complaints: Patient seen laying in bed this morning.  She states she slept well overnight.  She is aware of plans for discharge today.   ROS: Denies CP, SOB, N/V/D  Objective: Vital Signs: Blood pressure (!) 162/80, pulse 77, temperature 99.3 F (37.4 C), temperature source Oral, resp. rate 20, height 5' (1.524 m), weight 70.8 kg, SpO2 94 %. No results found. Recent Labs    09/11/20 0631  WBC 8.6  HGB 9.2*  HCT 29.2*  PLT 251   Recent Labs    09/11/20 0631  NA 140  K 3.9  CL 105  CO2 24  GLUCOSE 116*  BUN 24*  CREATININE 0.92  CALCIUM 8.1*    Intake/Output Summary (Last 24 hours) at 09/12/2020 0854 Last data filed at 09/12/2020 0756 Gross per 24 hour  Intake 390 ml  Output -  Net 390 ml     Pressure Injury 08/26/20 Breast Lateral;Left;Lower Unstageable - Full thickness tissue loss in which the base of the injury is covered by slough (yellow, tan, gray, green or brown) and/or eschar (tan, brown or black) in the wound bed. (Active)  08/26/20 1430  Location: Breast  Location Orientation: Lateral;Left;Lower  Staging: Unstageable - Full thickness tissue loss in which the base of the injury is covered by slough (yellow, tan, gray, green or brown) and/or eschar (tan, brown or black) in the wound bed.  Wound Description (Comments):   Present on Admission: Yes     Pressure Injury 08/26/20 Arm Anterior;Left;Proximal;Upper Stage 2 -  Partial thickness loss of dermis presenting as a shallow open injury with a red, pink wound bed without slough. (Active)  08/26/20 1430  Location: Arm  Location Orientation: Anterior;Left;Proximal;Upper  Staging: Stage 2 -  Partial thickness loss of dermis presenting as a shallow open injury with a red, pink wound bed without slough.  Wound Description (Comments):   Present on Admission: Yes    Physical Exam: BP (!) 162/80 (BP Location: Right Arm)   Pulse 77   Temp  99.3 F (37.4 C) (Oral)   Resp 20   Ht 5' (1.524 m)   Wt 70.8 kg   SpO2 94%   BMI 30.47 kg/m   Constitutional: No distress . Vital signs reviewed. HENT: Normocephalic.  Atraumatic. Eyes: EOMI. No discharge. Cardiovascular: No JVD.  RRR. Respiratory: Normal effort.  No stridor.  Bilateral clear to auscultation. GI: Non-distended.  BS +. Skin: Warm and dry.   Unstageable ulcer to left breast, improving Small stage II ulcer to left proximal medial arm, improving Stage I pressure ulcer to the left axilla, improving Psych: Confused.  Anxious. Musc: No edema in extremities.  No tenderness in extremities. Neuro: Alert HOH Motor: Grossly 4/5 throughout, left upper extremity with limitations due to pain, unchanged  Assessment/Plan: 1. Functional deficits which require 3+ hours per day of interdisciplinary therapy in a comprehensive inpatient rehab setting.  Physiatrist is providing close team supervision and 24 hour management of active medical problems listed below.  Physiatrist and rehab team continue to assess barriers to discharge/monitor patient progress toward functional and medical goals   Care Tool:  Bathing    Body parts bathed by patient: Chest,Abdomen,Face,Left arm,Front perineal area,Right upper leg,Left upper leg   Body parts bathed by helper: Right lower leg,Left lower leg,Buttocks,Right arm     Bathing assist Assist Level: Moderate Assistance - Patient 50 - 74%     Upper Body Dressing/Undressing Upper body dressing   What  is the patient wearing?: Bra,Pull over shirt    Upper body assist Assist Level: Moderate Assistance - Patient 50 - 74%    Lower Body Dressing/Undressing Lower body dressing      What is the patient wearing?: Pants,Incontinence brief     Lower body assist Assist for lower body dressing: Maximal Assistance - Patient 25 - 49%     Toileting Toileting    Toileting assist Assist for toileting: Minimal Assistance - Patient > 75%      Transfers Chair/bed transfer  Transfers assist     Chair/bed transfer assist level: Minimal Assistance - Patient > 75% Chair/bed transfer assistive device: Programmer, multimedia   Ambulation assist      Assist level: Contact Guard/Touching assist Assistive device: Walker-platform Max distance: 160ft   Walk 10 feet activity   Assist     Assist level: Contact Guard/Touching assist Assistive device: Walker-platform   Walk 50 feet activity   Assist Walk 50 feet with 2 turns activity did not occur: Safety/medical concerns  Assist level: Contact Guard/Touching assist Assistive device: Walker-platform    Walk 150 feet activity   Assist Walk 150 feet activity did not occur: Safety/medical concerns  Assist level: Contact Guard/Touching assist Assistive device: Walker-platform    Walk 10 feet on uneven surface  activity   Assist Walk 10 feet on uneven surfaces activity did not occur: Safety/medical concerns   Assist level: Contact Guard/Touching assist Assistive device: Walker-platform   Wheelchair     Assist Will patient use wheelchair at discharge?: No             Wheelchair 50 feet with 2 turns activity    Assist            Wheelchair 150 feet activity     Assist           Medical Problem List and Plan: 1.Decreased functional build altered mental statussecondary to traumatic SDH/SAH/acute metabolic encephalopathy  DC today  Will see patient for hospital follow-up in 1 month post-discharge 2. Antithrombotics: -DVT/anticoagulation:SCDs Dopplers negative for DVT -antiplatelet therapy: N/A 3. Pain Management:Lidoderm patch  Voltaren gelQID added to right elbow  Continue voltaren gel and kpad.  Controlled on 1/20  Monitor increased exertion 4. Mood:Provide emotional support -antipsychotic agents: N/A 5. Neuropsych: This patientis not fully capable of making decisions on herown  behalf. 6. Skin/Wound Care:Routine skin checks  Pressure relief to left proximal arm, healing stage II  Left lateral breast with unstagable ulcer-discussed with daughter, unclear chronicity of ulcer- ultrasound ordered to rule out breast CA prior to debridement-discussed with radiologist.    Limited ultrasound negative for underlying malignancy    Will consider debridement to expedite healing  Left stage I axillary wound 7. Fluids/Electrolytes/Nutrition:Routine in and outs  -encourage appropriate PO 8. Left mildly displaced fracture of the radial head and neck.  -Conservative care.  -Continue to follow Dr. Lorin Mercy as an outpatient.   Patient may use platform walker with elbow splint at 90 degrees per Ortho-we will follow-up with Ortho regarding ability to remove, continue to await further recs 9. Hyperlipidemia.  Lipitor on hold due to elevated LFTs, remains elevated on 1/19  Will need to follow-up as outpatient for restarting 10.  Transaminitis.  -recent RUQ [place] suggestive of background liver disease  LFTs elevated on 1/19 11. HTN:   Cozaar 25 started on 1/5, increased to 50 on 1/10, increased to 75 on 1/14  Overall improved, slightly elevated this a.m., will need to follow-up in  ambulatory setting with potential further adjustments as necessary. 12.  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  Hypoalbuminemia  Supplement initiated on 1/4 14. Leukocytosis: Resolved  WBCs 10.4 on 1/10  Afebrile  Continue to monitor 15.  Acute blood loss anemia  Hemoglobin 9.2 on 1/19, repeat labs ordered  Hemoccult ordered, pending-discussed with nursing  Continue to monitor  14.  AMS-baseline fluctuations  EEG unremarkable for seizures  CT/MRI unchanged  UA relatively unremarkable  Appears to be back to baseline on 1/14-per report patient has tendency to wax/wane PTA 15. TSH is elevated to 5.384, was normal 2 weeks ago. Likely elevated  due to TBI: repeat level with T3 and T4 in 3 months outpatient.   > 30 minutes spent in total in discharge planning between myself and PA regarding aforementioned, as well discussion regarding DME equipment, follow-up appointments, follow-up therapies, discharge medications, lab work, discharge recommendations, answering questions  LOS: 17 days A FACE TO Julian 09/12/2020, 8:54 AM

## 2020-09-13 ENCOUNTER — Telehealth: Payer: Self-pay

## 2020-09-13 DIAGNOSIS — I10 Essential (primary) hypertension: Secondary | ICD-10-CM | POA: Diagnosis not present

## 2020-09-13 DIAGNOSIS — Z9181 History of falling: Secondary | ICD-10-CM | POA: Diagnosis not present

## 2020-09-13 DIAGNOSIS — M8580 Other specified disorders of bone density and structure, unspecified site: Secondary | ICD-10-CM | POA: Diagnosis not present

## 2020-09-13 DIAGNOSIS — M6281 Muscle weakness (generalized): Secondary | ICD-10-CM | POA: Diagnosis not present

## 2020-09-13 DIAGNOSIS — S52025D Nondisplaced fracture of olecranon process without intraarticular extension of left ulna, subsequent encounter for closed fracture with routine healing: Secondary | ICD-10-CM | POA: Diagnosis not present

## 2020-09-13 DIAGNOSIS — S066X0D Traumatic subarachnoid hemorrhage without loss of consciousness, subsequent encounter: Secondary | ICD-10-CM | POA: Diagnosis not present

## 2020-09-13 DIAGNOSIS — I480 Paroxysmal atrial fibrillation: Secondary | ICD-10-CM | POA: Diagnosis not present

## 2020-09-13 NOTE — Telephone Encounter (Signed)
I called to provide VO however, no answer and no voicemail set up.

## 2020-09-13 NOTE — Telephone Encounter (Signed)
Physical Therapist, Gwinda Passe, from [facility] called requesting verbal orders for pt. Requesting orders for physical therapy for 2 x 5 weeks and 1 x 3 weeks. This is for transfer & gait training and strengthening. Please advise.

## 2020-09-16 NOTE — Telephone Encounter (Signed)
Tried calling the number below again and no answer and no vm set up.

## 2020-09-17 DIAGNOSIS — M8580 Other specified disorders of bone density and structure, unspecified site: Secondary | ICD-10-CM | POA: Diagnosis not present

## 2020-09-17 DIAGNOSIS — I10 Essential (primary) hypertension: Secondary | ICD-10-CM | POA: Diagnosis not present

## 2020-09-17 DIAGNOSIS — I480 Paroxysmal atrial fibrillation: Secondary | ICD-10-CM | POA: Diagnosis not present

## 2020-09-17 DIAGNOSIS — S52025D Nondisplaced fracture of olecranon process without intraarticular extension of left ulna, subsequent encounter for closed fracture with routine healing: Secondary | ICD-10-CM | POA: Diagnosis not present

## 2020-09-17 DIAGNOSIS — M6281 Muscle weakness (generalized): Secondary | ICD-10-CM | POA: Diagnosis not present

## 2020-09-17 DIAGNOSIS — S066X0D Traumatic subarachnoid hemorrhage without loss of consciousness, subsequent encounter: Secondary | ICD-10-CM | POA: Diagnosis not present

## 2020-09-17 DIAGNOSIS — Z9181 History of falling: Secondary | ICD-10-CM | POA: Diagnosis not present

## 2020-09-18 DIAGNOSIS — I10 Essential (primary) hypertension: Secondary | ICD-10-CM | POA: Diagnosis not present

## 2020-09-18 DIAGNOSIS — M8580 Other specified disorders of bone density and structure, unspecified site: Secondary | ICD-10-CM | POA: Diagnosis not present

## 2020-09-18 DIAGNOSIS — S52025D Nondisplaced fracture of olecranon process without intraarticular extension of left ulna, subsequent encounter for closed fracture with routine healing: Secondary | ICD-10-CM | POA: Diagnosis not present

## 2020-09-18 DIAGNOSIS — M6281 Muscle weakness (generalized): Secondary | ICD-10-CM | POA: Diagnosis not present

## 2020-09-18 DIAGNOSIS — S066X0D Traumatic subarachnoid hemorrhage without loss of consciousness, subsequent encounter: Secondary | ICD-10-CM | POA: Diagnosis not present

## 2020-09-18 DIAGNOSIS — Z9181 History of falling: Secondary | ICD-10-CM | POA: Diagnosis not present

## 2020-09-18 DIAGNOSIS — I480 Paroxysmal atrial fibrillation: Secondary | ICD-10-CM | POA: Diagnosis not present

## 2020-09-19 DIAGNOSIS — M19022 Primary osteoarthritis, left elbow: Secondary | ICD-10-CM | POA: Diagnosis not present

## 2020-09-19 DIAGNOSIS — S52132A Displaced fracture of neck of left radius, initial encounter for closed fracture: Secondary | ICD-10-CM | POA: Diagnosis not present

## 2020-09-20 DIAGNOSIS — S066X0D Traumatic subarachnoid hemorrhage without loss of consciousness, subsequent encounter: Secondary | ICD-10-CM | POA: Diagnosis not present

## 2020-09-20 DIAGNOSIS — M8580 Other specified disorders of bone density and structure, unspecified site: Secondary | ICD-10-CM | POA: Diagnosis not present

## 2020-09-20 DIAGNOSIS — Z9181 History of falling: Secondary | ICD-10-CM | POA: Diagnosis not present

## 2020-09-20 DIAGNOSIS — I10 Essential (primary) hypertension: Secondary | ICD-10-CM | POA: Diagnosis not present

## 2020-09-20 DIAGNOSIS — M6281 Muscle weakness (generalized): Secondary | ICD-10-CM | POA: Diagnosis not present

## 2020-09-20 DIAGNOSIS — I480 Paroxysmal atrial fibrillation: Secondary | ICD-10-CM | POA: Diagnosis not present

## 2020-09-20 DIAGNOSIS — S52025D Nondisplaced fracture of olecranon process without intraarticular extension of left ulna, subsequent encounter for closed fracture with routine healing: Secondary | ICD-10-CM | POA: Diagnosis not present

## 2020-09-23 DIAGNOSIS — Z9181 History of falling: Secondary | ICD-10-CM | POA: Diagnosis not present

## 2020-09-23 DIAGNOSIS — S066X0D Traumatic subarachnoid hemorrhage without loss of consciousness, subsequent encounter: Secondary | ICD-10-CM | POA: Diagnosis not present

## 2020-09-23 DIAGNOSIS — M6281 Muscle weakness (generalized): Secondary | ICD-10-CM | POA: Diagnosis not present

## 2020-09-23 DIAGNOSIS — M8580 Other specified disorders of bone density and structure, unspecified site: Secondary | ICD-10-CM | POA: Diagnosis not present

## 2020-09-23 DIAGNOSIS — I10 Essential (primary) hypertension: Secondary | ICD-10-CM | POA: Diagnosis not present

## 2020-09-23 DIAGNOSIS — I480 Paroxysmal atrial fibrillation: Secondary | ICD-10-CM | POA: Diagnosis not present

## 2020-09-23 DIAGNOSIS — S52025D Nondisplaced fracture of olecranon process without intraarticular extension of left ulna, subsequent encounter for closed fracture with routine healing: Secondary | ICD-10-CM | POA: Diagnosis not present

## 2020-09-24 ENCOUNTER — Encounter: Payer: Medicare Other | Admitting: Physical Medicine & Rehabilitation

## 2020-09-24 DIAGNOSIS — S066X0D Traumatic subarachnoid hemorrhage without loss of consciousness, subsequent encounter: Secondary | ICD-10-CM | POA: Diagnosis not present

## 2020-09-24 DIAGNOSIS — M8580 Other specified disorders of bone density and structure, unspecified site: Secondary | ICD-10-CM | POA: Diagnosis not present

## 2020-09-24 DIAGNOSIS — S52025D Nondisplaced fracture of olecranon process without intraarticular extension of left ulna, subsequent encounter for closed fracture with routine healing: Secondary | ICD-10-CM | POA: Diagnosis not present

## 2020-09-24 DIAGNOSIS — M6281 Muscle weakness (generalized): Secondary | ICD-10-CM | POA: Diagnosis not present

## 2020-09-24 DIAGNOSIS — I10 Essential (primary) hypertension: Secondary | ICD-10-CM | POA: Diagnosis not present

## 2020-09-24 DIAGNOSIS — I480 Paroxysmal atrial fibrillation: Secondary | ICD-10-CM | POA: Diagnosis not present

## 2020-09-24 DIAGNOSIS — Z9181 History of falling: Secondary | ICD-10-CM | POA: Diagnosis not present

## 2020-09-26 DIAGNOSIS — Z9181 History of falling: Secondary | ICD-10-CM | POA: Diagnosis not present

## 2020-09-26 DIAGNOSIS — I10 Essential (primary) hypertension: Secondary | ICD-10-CM | POA: Diagnosis not present

## 2020-09-26 DIAGNOSIS — M6281 Muscle weakness (generalized): Secondary | ICD-10-CM | POA: Diagnosis not present

## 2020-09-26 DIAGNOSIS — M8580 Other specified disorders of bone density and structure, unspecified site: Secondary | ICD-10-CM | POA: Diagnosis not present

## 2020-09-26 DIAGNOSIS — S066X0D Traumatic subarachnoid hemorrhage without loss of consciousness, subsequent encounter: Secondary | ICD-10-CM | POA: Diagnosis not present

## 2020-09-26 DIAGNOSIS — S52025D Nondisplaced fracture of olecranon process without intraarticular extension of left ulna, subsequent encounter for closed fracture with routine healing: Secondary | ICD-10-CM | POA: Diagnosis not present

## 2020-09-26 DIAGNOSIS — I480 Paroxysmal atrial fibrillation: Secondary | ICD-10-CM | POA: Diagnosis not present

## 2020-09-27 DIAGNOSIS — M6281 Muscle weakness (generalized): Secondary | ICD-10-CM | POA: Diagnosis not present

## 2020-09-27 DIAGNOSIS — I480 Paroxysmal atrial fibrillation: Secondary | ICD-10-CM | POA: Diagnosis not present

## 2020-09-27 DIAGNOSIS — I10 Essential (primary) hypertension: Secondary | ICD-10-CM | POA: Diagnosis not present

## 2020-09-27 DIAGNOSIS — M8580 Other specified disorders of bone density and structure, unspecified site: Secondary | ICD-10-CM | POA: Diagnosis not present

## 2020-09-27 DIAGNOSIS — S066X0D Traumatic subarachnoid hemorrhage without loss of consciousness, subsequent encounter: Secondary | ICD-10-CM | POA: Diagnosis not present

## 2020-09-27 DIAGNOSIS — Z9181 History of falling: Secondary | ICD-10-CM | POA: Diagnosis not present

## 2020-09-27 DIAGNOSIS — S52025D Nondisplaced fracture of olecranon process without intraarticular extension of left ulna, subsequent encounter for closed fracture with routine healing: Secondary | ICD-10-CM | POA: Diagnosis not present

## 2020-09-29 DIAGNOSIS — S52025D Nondisplaced fracture of olecranon process without intraarticular extension of left ulna, subsequent encounter for closed fracture with routine healing: Secondary | ICD-10-CM | POA: Diagnosis not present

## 2020-09-29 DIAGNOSIS — Z9181 History of falling: Secondary | ICD-10-CM | POA: Diagnosis not present

## 2020-09-29 DIAGNOSIS — M6281 Muscle weakness (generalized): Secondary | ICD-10-CM | POA: Diagnosis not present

## 2020-09-29 DIAGNOSIS — I10 Essential (primary) hypertension: Secondary | ICD-10-CM | POA: Diagnosis not present

## 2020-09-29 DIAGNOSIS — I480 Paroxysmal atrial fibrillation: Secondary | ICD-10-CM | POA: Diagnosis not present

## 2020-09-29 DIAGNOSIS — M8580 Other specified disorders of bone density and structure, unspecified site: Secondary | ICD-10-CM | POA: Diagnosis not present

## 2020-09-29 DIAGNOSIS — S066X0D Traumatic subarachnoid hemorrhage without loss of consciousness, subsequent encounter: Secondary | ICD-10-CM | POA: Diagnosis not present

## 2020-09-30 DIAGNOSIS — Z9181 History of falling: Secondary | ICD-10-CM | POA: Diagnosis not present

## 2020-09-30 DIAGNOSIS — I480 Paroxysmal atrial fibrillation: Secondary | ICD-10-CM | POA: Diagnosis not present

## 2020-09-30 DIAGNOSIS — S066X0D Traumatic subarachnoid hemorrhage without loss of consciousness, subsequent encounter: Secondary | ICD-10-CM | POA: Diagnosis not present

## 2020-09-30 DIAGNOSIS — I10 Essential (primary) hypertension: Secondary | ICD-10-CM | POA: Diagnosis not present

## 2020-09-30 DIAGNOSIS — S52025D Nondisplaced fracture of olecranon process without intraarticular extension of left ulna, subsequent encounter for closed fracture with routine healing: Secondary | ICD-10-CM | POA: Diagnosis not present

## 2020-09-30 DIAGNOSIS — M8580 Other specified disorders of bone density and structure, unspecified site: Secondary | ICD-10-CM | POA: Diagnosis not present

## 2020-09-30 DIAGNOSIS — M6281 Muscle weakness (generalized): Secondary | ICD-10-CM | POA: Diagnosis not present

## 2020-10-03 DIAGNOSIS — I10 Essential (primary) hypertension: Secondary | ICD-10-CM | POA: Diagnosis not present

## 2020-10-03 DIAGNOSIS — S52025D Nondisplaced fracture of olecranon process without intraarticular extension of left ulna, subsequent encounter for closed fracture with routine healing: Secondary | ICD-10-CM | POA: Diagnosis not present

## 2020-10-03 DIAGNOSIS — Z9181 History of falling: Secondary | ICD-10-CM | POA: Diagnosis not present

## 2020-10-03 DIAGNOSIS — M8580 Other specified disorders of bone density and structure, unspecified site: Secondary | ICD-10-CM | POA: Diagnosis not present

## 2020-10-03 DIAGNOSIS — I480 Paroxysmal atrial fibrillation: Secondary | ICD-10-CM | POA: Diagnosis not present

## 2020-10-03 DIAGNOSIS — S066X0D Traumatic subarachnoid hemorrhage without loss of consciousness, subsequent encounter: Secondary | ICD-10-CM | POA: Diagnosis not present

## 2020-10-03 DIAGNOSIS — M6281 Muscle weakness (generalized): Secondary | ICD-10-CM | POA: Diagnosis not present

## 2020-10-04 DIAGNOSIS — S066X0D Traumatic subarachnoid hemorrhage without loss of consciousness, subsequent encounter: Secondary | ICD-10-CM | POA: Diagnosis not present

## 2020-10-04 DIAGNOSIS — S52025D Nondisplaced fracture of olecranon process without intraarticular extension of left ulna, subsequent encounter for closed fracture with routine healing: Secondary | ICD-10-CM | POA: Diagnosis not present

## 2020-10-04 DIAGNOSIS — I480 Paroxysmal atrial fibrillation: Secondary | ICD-10-CM | POA: Diagnosis not present

## 2020-10-04 DIAGNOSIS — Z9181 History of falling: Secondary | ICD-10-CM | POA: Diagnosis not present

## 2020-10-04 DIAGNOSIS — I10 Essential (primary) hypertension: Secondary | ICD-10-CM | POA: Diagnosis not present

## 2020-10-04 DIAGNOSIS — M6281 Muscle weakness (generalized): Secondary | ICD-10-CM | POA: Diagnosis not present

## 2020-10-04 DIAGNOSIS — M8580 Other specified disorders of bone density and structure, unspecified site: Secondary | ICD-10-CM | POA: Diagnosis not present

## 2020-10-08 DIAGNOSIS — S52025D Nondisplaced fracture of olecranon process without intraarticular extension of left ulna, subsequent encounter for closed fracture with routine healing: Secondary | ICD-10-CM | POA: Diagnosis not present

## 2020-10-08 DIAGNOSIS — S066X0D Traumatic subarachnoid hemorrhage without loss of consciousness, subsequent encounter: Secondary | ICD-10-CM | POA: Diagnosis not present

## 2020-10-08 DIAGNOSIS — I10 Essential (primary) hypertension: Secondary | ICD-10-CM | POA: Diagnosis not present

## 2020-10-08 DIAGNOSIS — M6281 Muscle weakness (generalized): Secondary | ICD-10-CM | POA: Diagnosis not present

## 2020-10-08 DIAGNOSIS — Z9181 History of falling: Secondary | ICD-10-CM | POA: Diagnosis not present

## 2020-10-08 DIAGNOSIS — I480 Paroxysmal atrial fibrillation: Secondary | ICD-10-CM | POA: Diagnosis not present

## 2020-10-08 DIAGNOSIS — M8580 Other specified disorders of bone density and structure, unspecified site: Secondary | ICD-10-CM | POA: Diagnosis not present

## 2020-10-10 DIAGNOSIS — M6281 Muscle weakness (generalized): Secondary | ICD-10-CM | POA: Diagnosis not present

## 2020-10-10 DIAGNOSIS — S52025D Nondisplaced fracture of olecranon process without intraarticular extension of left ulna, subsequent encounter for closed fracture with routine healing: Secondary | ICD-10-CM | POA: Diagnosis not present

## 2020-10-10 DIAGNOSIS — I10 Essential (primary) hypertension: Secondary | ICD-10-CM | POA: Diagnosis not present

## 2020-10-10 DIAGNOSIS — M8580 Other specified disorders of bone density and structure, unspecified site: Secondary | ICD-10-CM | POA: Diagnosis not present

## 2020-10-10 DIAGNOSIS — Z9181 History of falling: Secondary | ICD-10-CM | POA: Diagnosis not present

## 2020-10-10 DIAGNOSIS — I480 Paroxysmal atrial fibrillation: Secondary | ICD-10-CM | POA: Diagnosis not present

## 2020-10-10 DIAGNOSIS — S066X0D Traumatic subarachnoid hemorrhage without loss of consciousness, subsequent encounter: Secondary | ICD-10-CM | POA: Diagnosis not present

## 2020-10-11 DIAGNOSIS — M6281 Muscle weakness (generalized): Secondary | ICD-10-CM | POA: Diagnosis not present

## 2020-10-11 DIAGNOSIS — I10 Essential (primary) hypertension: Secondary | ICD-10-CM | POA: Diagnosis not present

## 2020-10-11 DIAGNOSIS — S52025D Nondisplaced fracture of olecranon process without intraarticular extension of left ulna, subsequent encounter for closed fracture with routine healing: Secondary | ICD-10-CM | POA: Diagnosis not present

## 2020-10-11 DIAGNOSIS — S066X0D Traumatic subarachnoid hemorrhage without loss of consciousness, subsequent encounter: Secondary | ICD-10-CM | POA: Diagnosis not present

## 2020-10-11 DIAGNOSIS — I480 Paroxysmal atrial fibrillation: Secondary | ICD-10-CM | POA: Diagnosis not present

## 2020-10-11 DIAGNOSIS — Z9181 History of falling: Secondary | ICD-10-CM | POA: Diagnosis not present

## 2020-10-11 DIAGNOSIS — M8580 Other specified disorders of bone density and structure, unspecified site: Secondary | ICD-10-CM | POA: Diagnosis not present

## 2020-10-14 ENCOUNTER — Telehealth: Payer: Self-pay

## 2020-10-14 ENCOUNTER — Other Ambulatory Visit (HOSPITAL_COMMUNITY): Payer: Self-pay | Admitting: Physician Assistant

## 2020-10-14 DIAGNOSIS — Z9181 History of falling: Secondary | ICD-10-CM | POA: Diagnosis not present

## 2020-10-14 DIAGNOSIS — S066X0D Traumatic subarachnoid hemorrhage without loss of consciousness, subsequent encounter: Secondary | ICD-10-CM | POA: Diagnosis not present

## 2020-10-14 DIAGNOSIS — M8580 Other specified disorders of bone density and structure, unspecified site: Secondary | ICD-10-CM | POA: Diagnosis not present

## 2020-10-14 DIAGNOSIS — S52025D Nondisplaced fracture of olecranon process without intraarticular extension of left ulna, subsequent encounter for closed fracture with routine healing: Secondary | ICD-10-CM | POA: Diagnosis not present

## 2020-10-14 DIAGNOSIS — I480 Paroxysmal atrial fibrillation: Secondary | ICD-10-CM | POA: Diagnosis not present

## 2020-10-14 DIAGNOSIS — I10 Essential (primary) hypertension: Secondary | ICD-10-CM | POA: Diagnosis not present

## 2020-10-14 DIAGNOSIS — M6281 Muscle weakness (generalized): Secondary | ICD-10-CM | POA: Diagnosis not present

## 2020-10-14 MED ORDER — LOSARTAN POTASSIUM 25 MG PO TABS: 75.0000 mg | ORAL_TABLET | Freq: Every day | ORAL | 3 refills | Status: DC

## 2020-10-14 MED FILL — LOSARTAN POTASSIUM 25 MG TA: 25 | 10 days supply | Qty: 30 | Fill #0

## 2020-10-14 NOTE — Telephone Encounter (Signed)
Rx sent 

## 2020-10-14 NOTE — Telephone Encounter (Signed)
..   LAST APPOINTMENT DATE: 07/14/2020   NEXT APPOINTMENT DATE:@Visit  date not found  MEDICATION:losartan (COZAAR) 25 MG tablet    [facility], Dendron Burt is not getting this medication refilled in enough time. This medication was prescribed to pt at hospital. They are unable to locate the dr that prescribed it in enough time. Pt is on her last pill today.

## 2020-10-15 ENCOUNTER — Telehealth: Payer: Self-pay

## 2020-10-15 NOTE — Telephone Encounter (Signed)
Type of forms received:LONG TERM CARE SERVICES   Routed to: [place] received by Trecia Rogers requesting form]:  MC / HUNTER    Individual made aware of 3-5 business day turn around (Y/N):  Y  Faxed to : Florian Buff [phone] [facility]   Form location: [place]

## 2020-10-16 DIAGNOSIS — Z9181 History of falling: Secondary | ICD-10-CM | POA: Diagnosis not present

## 2020-10-16 DIAGNOSIS — S52025D Nondisplaced fracture of olecranon process without intraarticular extension of left ulna, subsequent encounter for closed fracture with routine healing: Secondary | ICD-10-CM | POA: Diagnosis not present

## 2020-10-16 DIAGNOSIS — S066X0D Traumatic subarachnoid hemorrhage without loss of consciousness, subsequent encounter: Secondary | ICD-10-CM | POA: Diagnosis not present

## 2020-10-16 DIAGNOSIS — I480 Paroxysmal atrial fibrillation: Secondary | ICD-10-CM | POA: Diagnosis not present

## 2020-10-16 DIAGNOSIS — M6281 Muscle weakness (generalized): Secondary | ICD-10-CM | POA: Diagnosis not present

## 2020-10-16 DIAGNOSIS — M8580 Other specified disorders of bone density and structure, unspecified site: Secondary | ICD-10-CM | POA: Diagnosis not present

## 2020-10-16 DIAGNOSIS — I10 Essential (primary) hypertension: Secondary | ICD-10-CM | POA: Diagnosis not present

## 2020-10-17 DIAGNOSIS — I480 Paroxysmal atrial fibrillation: Secondary | ICD-10-CM | POA: Diagnosis not present

## 2020-10-17 DIAGNOSIS — S52025D Nondisplaced fracture of olecranon process without intraarticular extension of left ulna, subsequent encounter for closed fracture with routine healing: Secondary | ICD-10-CM | POA: Diagnosis not present

## 2020-10-17 DIAGNOSIS — I10 Essential (primary) hypertension: Secondary | ICD-10-CM | POA: Diagnosis not present

## 2020-10-17 DIAGNOSIS — M6281 Muscle weakness (generalized): Secondary | ICD-10-CM | POA: Diagnosis not present

## 2020-10-17 DIAGNOSIS — Z9181 History of falling: Secondary | ICD-10-CM | POA: Diagnosis not present

## 2020-10-17 DIAGNOSIS — S066X0D Traumatic subarachnoid hemorrhage without loss of consciousness, subsequent encounter: Secondary | ICD-10-CM | POA: Diagnosis not present

## 2020-10-17 DIAGNOSIS — M8580 Other specified disorders of bone density and structure, unspecified site: Secondary | ICD-10-CM | POA: Diagnosis not present

## 2020-10-22 DIAGNOSIS — M8580 Other specified disorders of bone density and structure, unspecified site: Secondary | ICD-10-CM | POA: Diagnosis not present

## 2020-10-22 DIAGNOSIS — M6281 Muscle weakness (generalized): Secondary | ICD-10-CM | POA: Diagnosis not present

## 2020-10-22 DIAGNOSIS — I480 Paroxysmal atrial fibrillation: Secondary | ICD-10-CM | POA: Diagnosis not present

## 2020-10-22 DIAGNOSIS — Z9181 History of falling: Secondary | ICD-10-CM | POA: Diagnosis not present

## 2020-10-22 DIAGNOSIS — S52025D Nondisplaced fracture of olecranon process without intraarticular extension of left ulna, subsequent encounter for closed fracture with routine healing: Secondary | ICD-10-CM | POA: Diagnosis not present

## 2020-10-22 DIAGNOSIS — I10 Essential (primary) hypertension: Secondary | ICD-10-CM | POA: Diagnosis not present

## 2020-10-22 DIAGNOSIS — S066X0D Traumatic subarachnoid hemorrhage without loss of consciousness, subsequent encounter: Secondary | ICD-10-CM | POA: Diagnosis not present

## 2020-10-24 ENCOUNTER — Other Ambulatory Visit (INDEPENDENT_AMBULATORY_CARE_PROVIDER_SITE_OTHER): Payer: Medicare Other

## 2020-10-24 ENCOUNTER — Other Ambulatory Visit: Payer: Self-pay

## 2020-10-24 DIAGNOSIS — Z111 Encounter for screening for respiratory tuberculosis: Secondary | ICD-10-CM

## 2020-10-24 DIAGNOSIS — Z20822 Contact with and (suspected) exposure to covid-19: Secondary | ICD-10-CM | POA: Diagnosis not present

## 2020-10-25 DIAGNOSIS — S52025D Nondisplaced fracture of olecranon process without intraarticular extension of left ulna, subsequent encounter for closed fracture with routine healing: Secondary | ICD-10-CM | POA: Diagnosis not present

## 2020-10-25 DIAGNOSIS — I10 Essential (primary) hypertension: Secondary | ICD-10-CM | POA: Diagnosis not present

## 2020-10-25 DIAGNOSIS — M6281 Muscle weakness (generalized): Secondary | ICD-10-CM | POA: Diagnosis not present

## 2020-10-25 DIAGNOSIS — S066X0D Traumatic subarachnoid hemorrhage without loss of consciousness, subsequent encounter: Secondary | ICD-10-CM | POA: Diagnosis not present

## 2020-10-25 DIAGNOSIS — I480 Paroxysmal atrial fibrillation: Secondary | ICD-10-CM | POA: Diagnosis not present

## 2020-10-25 DIAGNOSIS — M8580 Other specified disorders of bone density and structure, unspecified site: Secondary | ICD-10-CM | POA: Diagnosis not present

## 2020-10-25 DIAGNOSIS — Z9181 History of falling: Secondary | ICD-10-CM | POA: Diagnosis not present

## 2020-10-26 LAB — QUANTIFERON-TB GOLD PLUS
Mitogen-NIL: >10 IU/mL
NIL: 0.04 IU/mL
QuantiFERON-TB Gold Plus: NEGATIVE
TB1-NIL: 0 IU/mL
TB2-NIL: 0 IU/mL

## 2020-10-28 DIAGNOSIS — M8580 Other specified disorders of bone density and structure, unspecified site: Secondary | ICD-10-CM | POA: Diagnosis not present

## 2020-10-28 DIAGNOSIS — I480 Paroxysmal atrial fibrillation: Secondary | ICD-10-CM | POA: Diagnosis not present

## 2020-10-28 DIAGNOSIS — S52025D Nondisplaced fracture of olecranon process without intraarticular extension of left ulna, subsequent encounter for closed fracture with routine healing: Secondary | ICD-10-CM | POA: Diagnosis not present

## 2020-10-28 DIAGNOSIS — S066X0D Traumatic subarachnoid hemorrhage without loss of consciousness, subsequent encounter: Secondary | ICD-10-CM | POA: Diagnosis not present

## 2020-10-28 DIAGNOSIS — I10 Essential (primary) hypertension: Secondary | ICD-10-CM | POA: Diagnosis not present

## 2020-10-28 DIAGNOSIS — M6281 Muscle weakness (generalized): Secondary | ICD-10-CM | POA: Diagnosis not present

## 2020-10-28 DIAGNOSIS — Z9181 History of falling: Secondary | ICD-10-CM | POA: Diagnosis not present

## 2020-10-29 ENCOUNTER — Telehealth: Payer: Self-pay

## 2020-10-29 NOTE — Telephone Encounter (Signed)
Paperwork has been filled out and the daughter has it.

## 2020-10-29 NOTE — Telephone Encounter (Signed)
I believe Rosanne Sack completed these earlier today

## 2020-10-29 NOTE — Telephone Encounter (Signed)
Pt daughter called asking if pt and her fathers FL2 forms. Spoke with Jaz and related message to daughter that forms are not completed yet. Daughter states that the forms need to be completed today because the pt and her husband are ready to move but cannot move until these forms are turned in to the facility. Daughter states she is going to come into office and get the forms. Please advise.

## 2020-10-29 NOTE — Telephone Encounter (Signed)
See below

## 2020-10-30 ENCOUNTER — Telehealth: Payer: Self-pay

## 2020-10-30 DIAGNOSIS — I480 Paroxysmal atrial fibrillation: Secondary | ICD-10-CM | POA: Diagnosis not present

## 2020-10-30 DIAGNOSIS — I10 Essential (primary) hypertension: Secondary | ICD-10-CM | POA: Diagnosis not present

## 2020-10-30 DIAGNOSIS — S52025D Nondisplaced fracture of olecranon process without intraarticular extension of left ulna, subsequent encounter for closed fracture with routine healing: Secondary | ICD-10-CM | POA: Diagnosis not present

## 2020-10-30 DIAGNOSIS — Z9181 History of falling: Secondary | ICD-10-CM | POA: Diagnosis not present

## 2020-10-30 DIAGNOSIS — S066X0D Traumatic subarachnoid hemorrhage without loss of consciousness, subsequent encounter: Secondary | ICD-10-CM | POA: Diagnosis not present

## 2020-10-30 DIAGNOSIS — M8580 Other specified disorders of bone density and structure, unspecified site: Secondary | ICD-10-CM | POA: Diagnosis not present

## 2020-10-30 DIAGNOSIS — M6281 Muscle weakness (generalized): Secondary | ICD-10-CM | POA: Diagnosis not present

## 2020-10-30 MED ORDER — LOSARTAN POTASSIUM 25 MG PO TABS: 75.0000 mg | ORAL_TABLET | Freq: Every day | ORAL | 3 refills | Status: DC

## 2020-10-30 NOTE — Telephone Encounter (Signed)
MEDICATION: Losartan   PHARMACY: [facility] [address] [place]  Comments: Pt daughter called stating a RX was only sent in for 10 days. Pt takes medication 3 x per day. Needs at least a 90 pill prescription to last for a month. Daughter asked if a 3 month supply could be sent in. Please advise.   **Let patient know to contact pharmacy at the end of the day to make sure medication is ready. **  ** Please notify patient to allow 48-72 hours to process**  **Encourage patient to contact the pharmacy for refills or they can request refills through [facility]**

## 2020-10-30 NOTE — Telephone Encounter (Signed)
Medication has been refilled. Year supply was sent in.

## 2020-11-07 DIAGNOSIS — Z9181 History of falling: Secondary | ICD-10-CM | POA: Diagnosis not present

## 2020-11-07 DIAGNOSIS — I10 Essential (primary) hypertension: Secondary | ICD-10-CM | POA: Diagnosis not present

## 2020-11-07 DIAGNOSIS — I480 Paroxysmal atrial fibrillation: Secondary | ICD-10-CM | POA: Diagnosis not present

## 2020-11-07 DIAGNOSIS — S52025D Nondisplaced fracture of olecranon process without intraarticular extension of left ulna, subsequent encounter for closed fracture with routine healing: Secondary | ICD-10-CM | POA: Diagnosis not present

## 2020-11-07 DIAGNOSIS — M6281 Muscle weakness (generalized): Secondary | ICD-10-CM | POA: Diagnosis not present

## 2020-11-07 DIAGNOSIS — S066X0D Traumatic subarachnoid hemorrhage without loss of consciousness, subsequent encounter: Secondary | ICD-10-CM | POA: Diagnosis not present

## 2020-11-07 DIAGNOSIS — M8580 Other specified disorders of bone density and structure, unspecified site: Secondary | ICD-10-CM | POA: Diagnosis not present

## 2020-11-11 ENCOUNTER — Telehealth: Payer: Self-pay

## 2020-11-11 NOTE — Telephone Encounter (Signed)
Crystal, [facility] from [employer] called for pt. Pt has been experiencing shoulder pain and is asking for tylenol. Crystal states there is not an order for tylenol. Crystal asked if an order could be sent in for PRN Tylenol. Please advise.

## 2020-11-11 NOTE — Telephone Encounter (Signed)
Script has been written and faxed.

## 2020-11-11 NOTE — Telephone Encounter (Signed)
Give verbal order or write out, fax stamp for tylenol/acetaminophen 650 mg (two 325 mg tablets) every 6 hours as needed for pain

## 2020-11-11 NOTE — Telephone Encounter (Signed)
Virtual needed?

## 2020-11-12 ENCOUNTER — Telehealth: Payer: Self-pay

## 2020-11-12 NOTE — Telephone Encounter (Signed)
Is this ok?

## 2020-11-12 NOTE — Telephone Encounter (Signed)
[facility] is calling asking for a refill on lidocaine (LIDODERM) 5 %  and they need to have a written order that states where to place the patch and also requesting orders that it is okay to give medicine with applesauce. Fax number is [phone]

## 2020-11-12 NOTE — Telephone Encounter (Signed)
Team please clarify where she is currently using the lidocaine- then can write order on pad and stamp for me.   Also can write ok to give oral meds with applesauce  [name]

## 2020-11-13 NOTE — Telephone Encounter (Signed)
Can write it to apply to the neck

## 2020-11-13 NOTE — Telephone Encounter (Signed)
The facility states that her daughter states that she's using the lidocaine patch for her neck pain. She isn't using the cream anymore the patch is more effective. So it is still okay for me to write the script for her ?

## 2020-11-14 ENCOUNTER — Telehealth: Payer: Self-pay

## 2020-11-14 NOTE — Telephone Encounter (Signed)
Please check with patient-if she does not want to use this for joint pain then you may remove it

## 2020-11-14 NOTE — Telephone Encounter (Signed)
[facility] is requesting an order to discontinue diclofenac Sodium (VOLTAREN) 1 % GEL   Fax [phone]

## 2020-11-14 NOTE — Telephone Encounter (Signed)
Ok to discontinue 

## 2020-11-14 NOTE — Telephone Encounter (Signed)
Called and lmtcb.

## 2020-11-14 NOTE — Telephone Encounter (Signed)
Script has been faxed

## 2020-11-15 NOTE — Telephone Encounter (Signed)
I've called both numbers on file. Unable to reach anybody. There is no number on this telephone note. Will try again Monday

## 2020-11-18 NOTE — Telephone Encounter (Signed)
Called and unable to reach, voicemail did not come on at pt phone numbers.

## 2020-11-19 ENCOUNTER — Telehealth: Payer: Self-pay

## 2020-11-19 MED ORDER — LOSARTAN POTASSIUM 25 MG PO TABS: 75.0000 mg | ORAL_TABLET | Freq: Every day | ORAL | 3 refills | Status: DC

## 2020-11-19 NOTE — Telephone Encounter (Signed)
Disregard

## 2020-11-19 NOTE — Telephone Encounter (Signed)
Rx sent 

## 2020-11-19 NOTE — Telephone Encounter (Signed)
  losartan (COZAAR) 25 MG tablet  Pt would like this medication sent to [facility], due to difference in cost.

## 2020-11-21 ENCOUNTER — Encounter: Payer: Self-pay | Admitting: Physician Assistant

## 2020-11-21 ENCOUNTER — Other Ambulatory Visit: Payer: Self-pay

## 2020-11-21 ENCOUNTER — Ambulatory Visit (INDEPENDENT_AMBULATORY_CARE_PROVIDER_SITE_OTHER): Payer: Medicare Other | Admitting: Physician Assistant

## 2020-11-21 VITALS — BP 140/76 | HR 83 | Temp 98.1°F | Ht 60.0 in | Wt 124.0 lb

## 2020-11-21 DIAGNOSIS — H6123 Impacted cerumen, bilateral: Secondary | ICD-10-CM

## 2020-11-21 NOTE — Progress Notes (Signed)
Acute Office Visit  Subjective:    Patient ID: [name], female    DOB: Jan 20, 1918, 87 y.o.   MRN: [id number]   HPI Patient is in today for ear cleaning.  She is here with her daughter.  She denies any pain in her ears.  She has had an increase in hearing loss and they would like for the wax to be cleared out today.  Daughter says she probably should be wearing hearing aids but she refuses to do so.  Past Medical History:  Diagnosis Date  . Hypertension   . Macular degeneration of left eye   . Osteopenia   . Stroke [facility])     Past Surgical History:  Procedure Laterality Date  . CATARACT EXTRACTION      Family History  Problem Relation Age of Onset  . Cancer Daughter        breast    Social History   Socioeconomic History  . Marital status: Married    Spouse name: Not on file  . Number of children: Not on file  . Years of education: Not on file  . Highest education level: Not on file  Occupational History  . Not on file  Tobacco Use  . Smoking status: Never Smoker  . Smokeless tobacco: Never Used  Vaping Use  . Vaping Use: Never used  Substance and Sexual Activity  . Alcohol use: Yes    Alcohol/week: 1.0 standard drink    Types: 1 Glasses of wine per week    Comment: 1 glass with dinner  . Drug use: No  . Sexual activity: Not on file  Other Topics Concern  . Not on file  Social History Narrative   Married (husband Tom age 85 in [place] practice) 7. 2 [place] oldest daughter to breast cancer. 4 grandkids.       Husband and patient live with daughter.       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       Hobbies: reading, had been quilting, enjoys cooking/baking.       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    Full Code.          Social Determinants of Health   Financial Resource Strain: Not on file  Food Insecurity: Not on file  Transportation Needs: Not on file  Physical Activity: Not on file  Stress: Not on  file  Social Connections: Not on file  Intimate Partner Violence: Not on file    Outpatient Medications Prior to Visit  Medication Sig Dispense Refill  . diclofenac Sodium (VOLTAREN) 1 % GEL Apply 2 g topically 4 (four) times daily. 2 g 0  . lidocaine (LIDODERM) 5 % Place 1 patch onto the skin daily. Remove & Discard patch within 12 hours or as directed by MD 30 patch 0  . losartan (COZAAR) 25 MG tablet Take 3 tablets (75 mg total) by mouth daily. 270 tablet 3  . Multiple Vitamin (MULTIVITAMIN) tablet Take 1 tablet by mouth daily.     No facility-administered medications prior to visit.    Allergies  Allergen Reactions  . Ace Inhibitors     REACTION: angioedema  . Other     Blood Thinners Cause Brain Bleeds    Review of Systems  HENT: Positive for hearing loss. Negative for ear discharge and ear pain.        Objective:    Physical Exam HENT:     Right Ear: Tympanic membrane normal. Decreased hearing  noted.     Left Ear: Tympanic membrane normal. Decreased hearing noted.     Ears:     Comments: Bilateral cerumen impaction.  Once cerumen was removed, visualization of the tympanic membranes and ear canal revealed normal findings.    BP 140/76   Pulse 83   Temp 98.1 F (36.7 C)   Ht 5' (1.524 m)   Wt 124 lb (56.2 kg)   SpO2 98%   BMI 24.22 kg/m  Wt Readings from Last 3 Encounters:  11/21/20 124 lb (56.2 kg)  08/26/20 156 lb (70.8 kg)  08/12/20 131 lb 6.3 oz (59.6 kg)    Health Maintenance Due  Topic Date Due  . INFLUENZA VACCINE  03/24/2020    There are no preventive care reminders to display for this patient.   Lab Results  Component Value Date   TSH 5.384 (H) 09/05/2020   Lab Results  Component Value Date   WBC 9.4 09/12/2020   HGB 9.2 (L) 09/12/2020   HCT 28.7 (L) 09/12/2020   MCV 85.9 09/12/2020   PLT 262 09/12/2020   Lab Results  Component Value Date   NA 140 09/11/2020   K 3.9 09/11/2020   CO2 24 09/11/2020   GLUCOSE 116 (H) 09/11/2020    BUN 24 (H) 09/11/2020   CREATININE 0.92 09/11/2020   BILITOT 1.0 09/11/2020   ALKPHOS 108 09/11/2020   AST 110 (H) 09/11/2020   ALT 151 (H) 09/11/2020   PROT 5.5 (L) 09/11/2020   ALBUMIN 2.0 (L) 09/11/2020   CALCIUM 8.1 (L) 09/11/2020   ANIONGAP 11 09/11/2020   GFR 45.18 (L) 12/21/2019   Lab Results  Component Value Date   CHOL 135 07/22/2020   Lab Results  Component Value Date   HDL 69 07/22/2020   Lab Results  Component Value Date   LDLCALC 53 07/22/2020   Lab Results  Component Value Date   TRIG 58 07/22/2020   Lab Results  Component Value Date   CHOLHDL 2.0 07/22/2020   Lab Results  Component Value Date   HGBA1C 5.8 (H) 03/25/2015       Assessment & Plan:   Problem List Items Addressed This Visit   None    1. Bilateral hearing loss due to cerumen impaction Procedure explained to patient and daughter and verbal consent obtained.  I used a lighted curette to remove cerumen from the right ear canal.  She had mild discomfort with this and some minimal bleeding was present at the end of the removal.  The left side was more difficult and therefore I did not attempt to curette, but irrigation was used to remove cerumen from this side.  Patient did tolerate this procedure well and noted that her hearing seemed to be improved once we were done today.  She will follow up if any concerns.  This note was prepared with assistance of Systems analyst. Occasional wrong-word or sound-a-like substitutions may have occurred due to the inherent limitations of voice recognition software. This visit occurred during the SARS-CoV-2 public health emergency.  Safety protocols were in place, including screening questions prior to the visit, additional usage of staff PPE, and extensive cleaning of exam room while observing appropriate contact time as indicated for disinfecting solutions.    Clover Feehan M Donnis Pecha, PA-C

## 2020-11-26 ENCOUNTER — Telehealth: Payer: Self-pay

## 2020-11-26 NOTE — Telephone Encounter (Signed)
Ishatue from [facility] calling to get orders for the patient to stop taking the gel lidocaine since is now using the patches.  Call back number [phone].

## 2020-11-26 NOTE — Telephone Encounter (Signed)
Call and spoke with the [facility]

## 2020-11-26 NOTE — Telephone Encounter (Signed)
Orders have been faxed

## 2020-11-26 NOTE — Telephone Encounter (Signed)
Spoke with the [facility] and her Manager then received the faxed paperwork.

## 2020-11-26 NOTE — Telephone Encounter (Signed)
Call back number [phone].   Please call [name] ( pronounced ice-[id number]) . She has not received anything. She tried to explain something, but I had trouble understanding. She requested a CMA call

## 2020-12-10 ENCOUNTER — Ambulatory Visit (INDEPENDENT_AMBULATORY_CARE_PROVIDER_SITE_OTHER): Payer: Medicare Other | Admitting: Ophthalmology

## 2020-12-10 ENCOUNTER — Encounter (INDEPENDENT_AMBULATORY_CARE_PROVIDER_SITE_OTHER): Payer: Self-pay | Admitting: Ophthalmology

## 2020-12-10 ENCOUNTER — Other Ambulatory Visit: Payer: Self-pay

## 2020-12-10 DIAGNOSIS — H353221 Exudative age-related macular degeneration, left eye, with active choroidal neovascularization: Secondary | ICD-10-CM | POA: Diagnosis not present

## 2020-12-10 MED ORDER — BEVACIZUMAB 2.5 MG/0.1ML IZ SOSY
2.5000 mg | PREFILLED_SYRINGE | INTRAVITREAL | Status: AC | PRN
  Administered 2020-12-10: 2.5 mg via INTRAVITREAL

## 2020-12-10 NOTE — Addendum Note (Signed)
Addended by: Deloria Lair A on: 12/10/2020 11:15 AM   Modules accepted: Level of Service

## 2020-12-10 NOTE — Progress Notes (Signed)
12/10/2020     CHIEF COMPLAINT Patient presents for Retina Follow Up (4 Mo F/U OS, poss Avastin OS//Pt sts she fell on her head in Dec, 2021. Pt sts VA OS is blurry "on and off.")   HISTORY OF PRESENT ILLNESS: [name] is a 87 y.o. female who presents to the clinic today for:   HPI    Retina Follow Up    Patient presents with  Wet AMD.  In left eye.  This started 4 months ago.  Severity is mild.  Duration of 4 months.  Since onset it is gradually worsening. Additional comments: 4 Mo F/U OS, poss Avastin OS  Pt sts she fell on her head in Dec, 2021. Pt sts VA OS is blurry "on and off."       Last edited by Rockie Neighbours, [place] on 12/10/2020 10:13 AM. (History)      Referring physician: Marin Olp, [address] [place],  [place] [address]  HISTORICAL INFORMATION:   Selected notes from the MEDICAL RECORD NUMBER    Lab Results  Component Value Date   HGBA1C 5.8 (H) 03/25/2015     CURRENT MEDICATIONS: No current outpatient medications on file. (Ophthalmic Drugs)   No current facility-administered medications for this visit. (Ophthalmic Drugs)   Current Outpatient Medications (Other)  Medication Sig  . diclofenac Sodium (VOLTAREN) 1 % GEL APPLY 2 G TOPICALLY FOUR TIMES DAILY.  Marland Kitchen lidocaine (LIDODERM) 5 % PLACE 1 PATCH ONTO THE SKIN DAILY. REMOVE & DISCARD PATCH WITHIN 12 HOURS OR AS DIRECTED BY MD  . losartan (COZAAR) 25 MG tablet Take 3 tablets (75 mg total) by mouth daily.  Marland Kitchen losartan (COZAAR) 25 MG tablet TAKE 3 TABLETS (75 MG TOTAL) BY MOUTH DAILY.  . Multiple Vitamin (MULTIVITAMIN) tablet Take 1 tablet by mouth daily.   No current facility-administered medications for this visit. (Other)      REVIEW OF SYSTEMS:    ALLERGIES Allergies  Allergen Reactions  . Ace Inhibitors     REACTION: angioedema  . Other     Blood Thinners Cause Brain Bleeds    PAST MEDICAL HISTORY Past Medical History:  Diagnosis Date  . Hypertension   .  Macular degeneration of left eye   . Osteopenia   . Stroke [facility])    Past Surgical History:  Procedure Laterality Date  . CATARACT EXTRACTION      FAMILY HISTORY Family History  Problem Relation Age of Onset  . Cancer Daughter        breast    SOCIAL HISTORY Social History   Tobacco Use  . Smoking status: Never Smoker  . Smokeless tobacco: Never Used  Vaping Use  . Vaping Use: Never used  Substance Use Topics  . Alcohol use: Yes    Alcohol/week: 1.0 standard drink    Types: 1 Glasses of wine per week    Comment: 1 glass with dinner  . Drug use: No         OPHTHALMIC EXAM: Base Eye Exam    Visual Acuity (ETDRS)      Right Left   Dist cc 20/30 -2 20/200   Dist ph cc NI NI   Correction: Glasses       Tonometry (Tonopen, 10:17 AM)      Right Left   Pressure 12 14       Pupils      Pupils Dark Light Shape React APD   Right PERRL 5 5 Round Minimal  None   Left PERRL 5 5 Round Minimal None       Visual Fields (Counting fingers)      Left Right    Full Full       Extraocular Movement      Right Left    Full Full       Neuro/Psych    Oriented x3: Yes   Mood/Affect: Normal       Dilation    Left eye: 1.0% Mydriacyl, 2.5% Phenylephrine @ 10:17 AM        Slit Lamp and Fundus Exam    External Exam      Right Left   External Normal Normal       Slit Lamp Exam      Right Left   Lids/Lashes Normal Normal   Conjunctiva/Sclera White and quiet White and quiet   Cornea Clear Clear   Anterior Chamber Deep and quiet Deep and quiet   Iris Round and reactive Round and reactive   Lens Posterior chamber intraocular lens Posterior chamber intraocular lens   Anterior Vitreous Normal Normal       Fundus Exam      Right Left   Posterior Vitreous  Posterior vitreous detachment   Disc  Normal   C/D Ratio  0.35   Macula   Hard drusen, Pigmented atrophy, Advanced age related macular degeneration,  NO Subretinal hemorrhage   Vessels  Normal   Periphery   Normal          IMAGING AND PROCEDURES  Imaging and Procedures for 12/10/20  OCT, Retina - OU - Both Eyes       Right Eye Quality was good. Scan locations included subfoveal. Central Foveal Thickness: 244. Progression has been stable. Findings include normal observations, retinal drusen , no SRF, no IRF.   Left Eye Quality was good. Scan locations included subfoveal. Central Foveal Thickness: 408. Progression has worsened. Findings include pigment epithelial detachment, abnormal foveal contour, subretinal fluid, intraretinal fluid.   Notes Today at 25-month follow-up after missed appointments due to health issues, increased subretinal fluid as well as intraretinal fluid accompanied by acuity change, OS, will repeat injection Avastin today       Intravitreal Injection, Pharmacologic Agent - OS - Left Eye       Time Out 12/10/2020. 11:11 AM. Confirmed correct patient, procedure, site, and patient consented.   Anesthesia Topical anesthesia was used. Anesthetic medications included Akten 3.5%.   Procedure Preparation included Tobramycin 0.3%, 10% betadine to eyelids, 5% betadine to ocular surface. A supplied needle was used.   Injection:  2.5 mg Bevacizumab (AVASTIN) 2.5mg /0.53mL SOSY   NDC: 35361-443-15, Lot: [id number]   Route: Intravitreal, Site: Left Eye  Post-op Post injection exam found visual acuity of at least counting fingers. The patient tolerated the procedure well. There were no complications. The patient received written and verbal post procedure care education. Post injection medications were not given.                 ASSESSMENT/PLAN:  No problem-specific Assessment & Plan notes found for this encounter.      ICD-10-CM   1. Exudative age-related macular degeneration of left eye with active choroidal neovascularization (HCC)  H35.3221 OCT, Retina - OU - Both Eyes    Intravitreal Injection, Pharmacologic Agent - OS - Left Eye    bevacizumab (AVASTIN)  SOSY 2.5 mg    1.  2.  3.  Ophthalmic Meds Ordered this visit:  Meds ordered this encounter  Medications  . bevacizumab (AVASTIN) SOSY 2.5 mg       Return in about 7 weeks (around 01/28/2021) for dilate, OS, AVASTIN OCT.  There are no Patient Instructions on file for this visit.   Explained the diagnoses, plan, and follow up with the patient and they expressed understanding.  Patient expressed understanding of the importance of proper follow up care.   Clent Demark Mabeline Varas M.D. Diseases & Surgery of the Retina and Vitreous [facility] [place] 12/10/20     Abbreviations: M myopia (nearsighted); A astigmatism; H hyperopia (farsighted); P presbyopia; Mrx spectacle prescription;  CTL contact lenses; OD right eye; OS left eye; OU both eyes  XT exotropia; ET esotropia; PEK punctate epithelial keratitis; PEE punctate epithelial erosions; DES dry eye syndrome; MGD meibomian gland dysfunction; ATs artificial tears; PFAT's preservative free artificial tears; [place] nuclear sclerotic cataract; PSC posterior subcapsular cataract; ERM epi-retinal membrane; PVD posterior vitreous detachment; RD retinal detachment; DM diabetes mellitus; DR diabetic retinopathy; NPDR non-proliferative diabetic retinopathy; PDR proliferative diabetic retinopathy; CSME clinically significant macular edema; DME diabetic macular edema; dbh dot blot hemorrhages; CWS cotton wool spot; POAG primary open angle glaucoma; C/D cup-to-disc ratio; HVF humphrey visual field; GVF goldmann visual field; OCT optical coherence tomography; IOP intraocular pressure; BRVO Branch retinal vein occlusion; CRVO central retinal vein occlusion; CRAO central retinal artery occlusion; BRAO branch retinal artery occlusion; RT retinal tear; SB scleral buckle; PPV pars plana vitrectomy; VH Vitreous hemorrhage; PRP panretinal laser photocoagulation; IVK intravitreal kenalog; VMT vitreomacular traction; MH Macular hole;  NVD neovascularization of the  disc; NVE neovascularization elsewhere; AREDS age related eye disease study; ARMD age related macular degeneration; POAG primary open angle glaucoma; EBMD epithelial/anterior basement membrane dystrophy; ACIOL anterior chamber intraocular lens; IOL intraocular lens; PCIOL posterior chamber intraocular lens; Phaco/IOL phacoemulsification with intraocular lens placement; [place] photorefractive keratectomy; LASIK laser assisted in situ keratomileusis; HTN hypertension; DM diabetes mellitus; COPD chronic obstructive pulmonary disease

## 2021-01-07 DIAGNOSIS — B351 Tinea unguium: Secondary | ICD-10-CM | POA: Diagnosis not present

## 2021-01-07 DIAGNOSIS — M2041 Other hammer toe(s) (acquired), right foot: Secondary | ICD-10-CM | POA: Diagnosis not present

## 2021-01-08 ENCOUNTER — Encounter (HOSPITAL_COMMUNITY): Payer: Self-pay

## 2021-01-08 ENCOUNTER — Inpatient Hospital Stay (HOSPITAL_COMMUNITY)
Admission: EM | Admit: 2021-01-08 | Discharge: 2021-01-09 | DRG: 545 | Disposition: A | Discharge status: Hospice | Payer: Medicare Other | Attending: Internal Medicine | Admitting: Internal Medicine

## 2021-01-08 ENCOUNTER — Telehealth: Payer: Self-pay

## 2021-01-08 ENCOUNTER — Emergency Department (HOSPITAL_COMMUNITY): Payer: Medicare Other

## 2021-01-08 DIAGNOSIS — G936 Cerebral edema: Secondary | ICD-10-CM | POA: Diagnosis present

## 2021-01-08 DIAGNOSIS — I680 Cerebral amyloid angiopathy: Secondary | ICD-10-CM | POA: Diagnosis present

## 2021-01-08 DIAGNOSIS — Z79899 Other long term (current) drug therapy: Secondary | ICD-10-CM

## 2021-01-08 DIAGNOSIS — E854 Organ-limited amyloidosis: Principal | ICD-10-CM | POA: Diagnosis present

## 2021-01-08 DIAGNOSIS — H353132 Nonexudative age-related macular degeneration, bilateral, intermediate dry stage: Secondary | ICD-10-CM | POA: Diagnosis present

## 2021-01-08 DIAGNOSIS — I609 Nontraumatic subarachnoid hemorrhage, unspecified: Secondary | ICD-10-CM | POA: Diagnosis present

## 2021-01-08 DIAGNOSIS — R29810 Facial weakness: Secondary | ICD-10-CM | POA: Diagnosis not present

## 2021-01-08 DIAGNOSIS — Z743 Need for continuous supervision: Secondary | ICD-10-CM | POA: Diagnosis not present

## 2021-01-08 DIAGNOSIS — W19XXXA Unspecified fall, initial encounter: Secondary | ICD-10-CM

## 2021-01-08 DIAGNOSIS — N1831 Chronic kidney disease, stage 3a: Secondary | ICD-10-CM | POA: Diagnosis not present

## 2021-01-08 DIAGNOSIS — S065X9A Traumatic subdural hemorrhage with loss of consciousness of unspecified duration, initial encounter: Secondary | ICD-10-CM

## 2021-01-08 DIAGNOSIS — R29722 NIHSS score 22: Secondary | ICD-10-CM | POA: Diagnosis not present

## 2021-01-08 DIAGNOSIS — R6889 Other general symptoms and signs: Secondary | ICD-10-CM | POA: Diagnosis not present

## 2021-01-08 DIAGNOSIS — I4891 Unspecified atrial fibrillation: Secondary | ICD-10-CM | POA: Diagnosis not present

## 2021-01-08 DIAGNOSIS — Z515 Encounter for palliative care: Secondary | ICD-10-CM | POA: Diagnosis not present

## 2021-01-08 DIAGNOSIS — G8191 Hemiplegia, unspecified affecting right dominant side: Secondary | ICD-10-CM | POA: Diagnosis present

## 2021-01-08 DIAGNOSIS — W07XXXA Fall from chair, initial encounter: Secondary | ICD-10-CM | POA: Diagnosis present

## 2021-01-08 DIAGNOSIS — R29898 Other symptoms and signs involving the musculoskeletal system: Secondary | ICD-10-CM | POA: Diagnosis not present

## 2021-01-08 DIAGNOSIS — I129 Hypertensive chronic kidney disease with stage 1 through stage 4 chronic kidney disease, or unspecified chronic kidney disease: Secondary | ICD-10-CM | POA: Diagnosis not present

## 2021-01-08 DIAGNOSIS — E782 Mixed hyperlipidemia: Secondary | ICD-10-CM | POA: Diagnosis not present

## 2021-01-08 DIAGNOSIS — I629 Nontraumatic intracranial hemorrhage, unspecified: Secondary | ICD-10-CM | POA: Diagnosis not present

## 2021-01-08 DIAGNOSIS — Z66 Do not resuscitate: Secondary | ICD-10-CM | POA: Diagnosis present

## 2021-01-08 DIAGNOSIS — I482 Chronic atrial fibrillation, unspecified: Secondary | ICD-10-CM | POA: Diagnosis not present

## 2021-01-08 DIAGNOSIS — R4701 Aphasia: Secondary | ICD-10-CM | POA: Diagnosis not present

## 2021-01-08 DIAGNOSIS — Z8673 Personal history of transient ischemic attack (TIA), and cerebral infarction without residual deficits: Secondary | ICD-10-CM | POA: Diagnosis not present

## 2021-01-08 DIAGNOSIS — S065XAA Traumatic subdural hemorrhage with loss of consciousness status unknown, initial encounter: Secondary | ICD-10-CM

## 2021-01-08 DIAGNOSIS — G252 Other specified forms of tremor: Secondary | ICD-10-CM | POA: Diagnosis not present

## 2021-01-08 DIAGNOSIS — R64 Cachexia: Secondary | ICD-10-CM | POA: Diagnosis present

## 2021-01-08 DIAGNOSIS — H353221 Exudative age-related macular degeneration, left eye, with active choroidal neovascularization: Secondary | ICD-10-CM | POA: Diagnosis present

## 2021-01-08 DIAGNOSIS — E876 Hypokalemia: Secondary | ICD-10-CM | POA: Diagnosis not present

## 2021-01-08 DIAGNOSIS — R4182 Altered mental status, unspecified: Secondary | ICD-10-CM

## 2021-01-08 DIAGNOSIS — I611 Nontraumatic intracerebral hemorrhage in hemisphere, cortical: Secondary | ICD-10-CM | POA: Diagnosis not present

## 2021-01-08 DIAGNOSIS — Z20822 Contact with and (suspected) exposure to covid-19: Secondary | ICD-10-CM | POA: Diagnosis present

## 2021-01-08 DIAGNOSIS — Z681 Body mass index (BMI) 19 or less, adult: Secondary | ICD-10-CM

## 2021-01-08 DIAGNOSIS — Y92009 Unspecified place in unspecified non-institutional (private) residence as the place of occurrence of the external cause: Secondary | ICD-10-CM

## 2021-01-08 DIAGNOSIS — R29818 Other symptoms and signs involving the nervous system: Secondary | ICD-10-CM | POA: Diagnosis not present

## 2021-01-08 DIAGNOSIS — Z888 Allergy status to other drugs, medicaments and biological substances status: Secondary | ICD-10-CM

## 2021-01-08 DIAGNOSIS — I161 Hypertensive emergency: Secondary | ICD-10-CM | POA: Diagnosis not present

## 2021-01-08 DIAGNOSIS — I10 Essential (primary) hypertension: Secondary | ICD-10-CM | POA: Diagnosis present

## 2021-01-08 DIAGNOSIS — I499 Cardiac arrhythmia, unspecified: Secondary | ICD-10-CM | POA: Diagnosis not present

## 2021-01-08 DIAGNOSIS — R404 Transient alteration of awareness: Secondary | ICD-10-CM | POA: Diagnosis not present

## 2021-01-08 LAB — CBC
HCT: 38.9 % (ref 36.0–46.0)
Hemoglobin: 12 g/dL (ref 12.0–15.0)
MCH: 25.6 pg — ABNORMAL LOW (ref 26.0–34.0)
MCHC: 30.8 g/dL (ref 30.0–36.0)
MCV: 82.9 fL (ref 80.0–100.0)
Platelets: 212 10*3/uL (ref 150–400)
RBC: 4.69 MIL/uL (ref 3.87–5.11)
RDW: 17.2 % — ABNORMAL HIGH (ref 11.5–15.5)
WBC: 9.3 10*3/uL (ref 4.0–10.5)
nRBC: 0 % (ref 0.0–0.2)

## 2021-01-08 LAB — COMPREHENSIVE METABOLIC PANEL
ALT: 17 U/L (ref 0–44)
AST: 20 U/L (ref 15–41)
Albumin: 3.4 g/dL — ABNORMAL LOW (ref 3.5–5.0)
Alkaline Phosphatase: 66 U/L (ref 38–126)
Anion gap: 9 (ref 5–15)
BUN: 17 mg/dL (ref 8–23)
CO2: 25 mmol/L (ref 22–32)
Calcium: 8.7 mg/dL — ABNORMAL LOW (ref 8.9–10.3)
Chloride: 109 mmol/L (ref 98–111)
Creatinine, Ser: 1.2 mg/dL — ABNORMAL HIGH (ref 0.44–1.00)
GFR, Estimated: 40 mL/min — ABNORMAL LOW (ref >60)
Glucose, Bld: 146 mg/dL — ABNORMAL HIGH (ref 70–99)
Potassium: 3.4 mmol/L — ABNORMAL LOW (ref 3.5–5.1)
Sodium: 143 mmol/L (ref 135–145)
Total Bilirubin: 0.8 mg/dL (ref 0.3–1.2)
Total Protein: 7.1 g/dL (ref 6.5–8.1)

## 2021-01-08 LAB — DIFFERENTIAL
Abs Immature Granulocytes: 0.06 10*3/uL (ref 0.00–0.07)
Basophils Absolute: 0 10*3/uL (ref 0.0–0.1)
Basophils Relative: 0 %
Eosinophils Absolute: 0.1 10*3/uL (ref 0.0–0.5)
Eosinophils Relative: 2 %
Immature Granulocytes: 1 %
Lymphocytes Relative: 24 %
Lymphs Abs: 2.2 10*3/uL (ref 0.7–4.0)
Monocytes Absolute: 1 10*3/uL (ref 0.1–1.0)
Monocytes Relative: 11 %
Neutro Abs: 5.8 10*3/uL (ref 1.7–7.7)
Neutrophils Relative %: 62 %

## 2021-01-08 LAB — I-STAT CHEM 8, ED
BUN: 19 mg/dL (ref 8–23)
Calcium, Ion: 1.06 mmol/L — ABNORMAL LOW (ref 1.15–1.40)
Chloride: 108 mmol/L (ref 98–111)
Creatinine, Ser: 1.1 mg/dL — ABNORMAL HIGH (ref 0.44–1.00)
Glucose, Bld: 143 mg/dL — ABNORMAL HIGH (ref 70–99)
HCT: 36 % (ref 36.0–46.0)
Hemoglobin: 12.2 g/dL (ref 12.0–15.0)
Potassium: 3.4 mmol/L — ABNORMAL LOW (ref 3.5–5.1)
Sodium: 143 mmol/L (ref 135–145)
TCO2: 24 mmol/L (ref 22–32)

## 2021-01-08 LAB — PROTIME-INR
INR: 1.2 (ref 0.8–1.2)
Prothrombin Time: 14.8 seconds (ref 11.4–15.2)

## 2021-01-08 LAB — RESP PANEL BY RT-PCR (FLU A&B, COVID) ARPGX2
Influenza A by PCR: NEGATIVE
Influenza B by PCR: NEGATIVE
SARS Coronavirus 2 by RT PCR: NEGATIVE

## 2021-01-08 LAB — APTT: aPTT: 26 seconds (ref 24–36)

## 2021-01-08 MED ORDER — ACETAMINOPHEN 325 MG PO TABS: 650.0000 mg | ORAL_TABLET | Freq: Four times a day (QID) | ORAL | Status: DC | PRN

## 2021-01-08 MED ORDER — ONDANSETRON HCL 4 MG/2ML IJ SOLN: 4.0000 mg | Freq: Four times a day (QID) | INTRAMUSCULAR | Status: DC | PRN

## 2021-01-08 MED ORDER — MORPHINE SULFATE (PF) 2 MG/ML IV SOLN: 1.0000 mg | INTRAVENOUS | Status: DC | PRN

## 2021-01-08 MED ORDER — SODIUM CHLORIDE 0.9% FLUSH
3.0000 mL | Freq: Once | INTRAVENOUS | Status: AC
Start: 2021-01-08 — End: 2021-01-08
  Administered 2021-01-08: 3 mL via INTRAVENOUS

## 2021-01-08 MED ORDER — LORAZEPAM 2 MG/ML PO CONC: 1.0000 mg | ORAL | Status: DC | PRN

## 2021-01-08 MED ORDER — CLEVIDIPINE BUTYRATE 0.5 MG/ML IV EMUL
0.0000 mg/h | INTRAVENOUS | Status: DC
  Administered 2021-01-08: 1 mg/h via INTRAVENOUS

## 2021-01-08 MED ORDER — ONDANSETRON 4 MG PO TBDP: 4.0000 mg | ORAL_TABLET | Freq: Four times a day (QID) | ORAL | Status: DC | PRN

## 2021-01-08 MED ORDER — HALOPERIDOL LACTATE 5 MG/ML IJ SOLN: 0.5000 mg | INTRAMUSCULAR | Status: DC | PRN

## 2021-01-08 MED ORDER — GLYCOPYRROLATE 1 MG PO TABS
1.0000 mg | ORAL_TABLET | ORAL | Status: DC | PRN
  Filled 2021-01-08: qty 1

## 2021-01-08 MED ORDER — HALOPERIDOL LACTATE 2 MG/ML PO CONC
0.5000 mg | ORAL | Status: DC | PRN
  Filled 2021-01-08: qty 0.3

## 2021-01-08 MED ORDER — LABETALOL HCL 5 MG/ML IV SOLN
10.0000 mg | Freq: Once | INTRAVENOUS | Status: AC
  Administered 2021-01-08: 10 mg via INTRAVENOUS

## 2021-01-08 MED ORDER — ACETAMINOPHEN 650 MG RE SUPP
650.0000 mg | Freq: Four times a day (QID) | RECTAL | Status: DC | PRN
Start: 2021-01-08 — End: 2021-01-10

## 2021-01-08 MED ORDER — LORAZEPAM 2 MG/ML IJ SOLN
1.0000 mg | INTRAMUSCULAR | Status: DC | PRN
  Administered 2021-01-09: 1 mg via INTRAVENOUS
  Filled 2021-01-08: qty 1

## 2021-01-08 MED ORDER — GLYCOPYRROLATE 0.2 MG/ML IJ SOLN
0.2000 mg | INTRAMUSCULAR | Status: DC | PRN
  Administered 2021-01-08 – 2021-01-09 (×3): 0.2 mg via INTRAVENOUS
  Filled 2021-01-08 (×3): qty 1

## 2021-01-08 MED ORDER — GLYCOPYRROLATE 0.2 MG/ML IJ SOLN: 0.2000 mg | INTRAMUSCULAR | Status: DC | PRN

## 2021-01-08 MED ORDER — BIOTENE DRY MOUTH MT LIQD: 15.0000 mL | OROMUCOSAL | Status: DC | PRN

## 2021-01-08 MED ORDER — LORAZEPAM 1 MG PO TABS: 1.0000 mg | ORAL_TABLET | ORAL | Status: DC | PRN

## 2021-01-08 MED ORDER — POLYVINYL ALCOHOL 1.4 % OP SOLN
1.0000 [drp] | Freq: Four times a day (QID) | OPHTHALMIC | Status: DC | PRN
  Filled 2021-01-08: qty 15

## 2021-01-08 MED ORDER — HALOPERIDOL 0.5 MG PO TABS
0.5000 mg | ORAL_TABLET | ORAL | Status: DC | PRN
  Filled 2021-01-08: qty 1

## 2021-01-08 NOTE — Code Documentation (Addendum)
Patient is from home with husband where she is very independent. At 1300 patient was sweeping her apartment and slid to the ground. [facility] was called and arrived to find patient with unequal pupils, right facial droop, left gaze, aphasic, and no movement on the right side. A code stroke was activated and she was taken to [facility]. She was met by the stroke team and cleared for CT. CT was completed. Results below. Cleviprex was started for BP control. Pt's BP 226/120 with EMS. NIHSS 26 (see stroke documentation for details). Per MD there is a devastating bleed. He has spoken to family at the bedside and it was agreed to make her a DNR and consult palliative medicine. Pt's daughter is calling in other family members to see the pt. Emotional support given. Hand off with Zoe, RN.   "IMPRESSION: 1. Large area of hemorrhage in the left temporoparietal lobe which appears parenchymal and subarachnoid. There is a moderately large left-sided subdural hematoma and left tentorial subdural hematoma. 2 x 3 cm hematoma right frontal lobe. Mild midline shift to the right. Differential diagnosis includes trauma, coagulopathy, amyloid, and hypertension. Review of the prior MRI does reveal numerous areas of chronic microhemorrhage in the brain which could be due to amyloid or hypertension. Chronic left-sided subdural hematoma noted on the prior CT and MRI. 2. These results were called by telephone at the time of interpretation on 01/07/2021 at 3:58 pm to provider Rory Percy , who verbally acknowledged these results"   Kaelon Weekes, Rande Brunt, RN  Stroke Response Nurse

## 2021-01-08 NOTE — Code Documentation (Signed)
Paged for Code Stroke   Upon arrival at bridge patient aphasic, right facial droop and right sided weakness.  Initially per EMS pupils were unequal with R>L.  Patient had hx of SAH/SDH.    CT + hemorrhage in multiple locations.  Patient transported to room ED27 and cleviprex started and Stroke RN at bedside to assist primary RN

## 2021-01-08 NOTE — Consult Note (Signed)
Neurology Consultation  Reason for Consult: Code stroke for weakness-right facial, left gaze, aphasia, unequal pupils Referring Physician: Dr. Lajean Saver  CC: Right facial weakness, left gaze, aphasia, unequal pupils  History is obtained from:, Daughter  HPI: [name] is a 87 y.o. 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  EMS was called and they noted that her pupils were unequal and she has gaze preference to the left as well as some right facial weakness. Code stroke was activated. Systolic blood pressure were in the 220s Upon arrival here, her examination is detailed below. CT head done stat revealed large hemorrhage in the left temporoparietal lobe which appears parenchymal and subarachnoid with moderately large left-sided subdural hematoma and left tentorial subdural hematoma along with 2 x 3 cm hematoma of the right frontal lobe.  There is mild midline shift to the right.  Review of prior MRI reveals numerous areas of chronic microhemorrhages-likely secondary to Emler angiopathy.   LKW: 1 PM today tpa given?: no, ICH Premorbid modified Rankin scale (mRS): Presumably 1-2 GCS 8 ICH score: 3 which portends a 72% 30-day mortality.  ROS: Full ROS was performed and is negative except as noted in the HPI.   Past Medical History:  Diagnosis Date  . Hypertension   . Macular degeneration of left eye   . Osteopenia   . Stroke [facility])    Family History  Problem Relation Age of Onset  . Cancer Daughter        breast    Social History:   reports that she has never smoked. She has never used smokeless tobacco. She  reports current alcohol use of about 1.0 standard drink of alcohol per week. She reports that she does not use drugs.  Medications  Current Facility-Administered Medications:  .  clevidipine (CLEVIPREX) infusion 0.5 mg/mL, 0-21 mg/hr, Intravenous, Continuous, [name], MD, Last Rate: 2 mL/hr at 2021-02-01 1555, 1 mg/hr at 02-01-21 1555  Current Outpatient Medications:  .  diclofenac Sodium (VOLTAREN) 1 % GEL, APPLY 2 G TOPICALLY FOUR TIMES DAILY., Disp: 200 g, Rfl: 0 .  lidocaine (LIDODERM) 5 %, PLACE 1 PATCH ONTO THE SKIN DAILY. REMOVE & DISCARD PATCH WITHIN 12 HOURS OR AS DIRECTED BY MD, Disp: 30 patch, Rfl: 0 .  losartan (COZAAR) 25 MG tablet, Take 3 tablets (75 mg total) by mouth daily., Disp: 270 tablet, Rfl: 3 .  losartan (COZAAR) 25 MG tablet, TAKE 3 TABLETS (75 MG TOTAL) BY MOUTH DAILY., Disp: 90 tablet, Rfl: 0 .  Multiple Vitamin (MULTIVITAMIN) tablet, Take 1 tablet by mouth daily., Disp: , Rfl:   Exam: Current vital signs: BP (!) 118/54   Pulse 62   Resp (!) 26   SpO2 95%  Vital signs in last 24 hours: Pulse Rate:  [52-66] 62 (05/18 1700) Resp:  [22-26] 26 (05/18 1700) BP: (113-178)/(53-86) 118/54 (05/18 1700) SpO2:  [87 %-96 %] 95 % (05/18 1700) General: Drowsy, opens eyes to voice. HEENT: Normocephalic, atraumatic, dry mucous membranes Lungs: Clear Cardiovascular: Irregularly irregular Extremities warm well perfused Neurological exam She is drowsy, opens eyes to voice but does not follow commands She is completely nonverbal Pupils equal round react light She  has somewhat of a roving eye movements without blink to threat from either side. Facial symmetry is difficult to ascertain She is staring off straight in space. To noxious stimulation, she is able to grimace but minimal withdrawal noted in both upper extremities. Minimal withdrawal also noted noxious simulation in both lower extremities.  NIHSS 1a Level of Conscious.: 1 1b LOC Questions: 2 1c LOC  Commands: 2 2 Best Gaze: 0 3 Visual: 0 4 Facial Palsy: 0 5a Motor Arm - left: 3 5b Motor Arm - Right: 3 6a Motor Leg - Left: 3 6b Motor Leg - Right: 3 7 Limb Ataxia: 0 8 Sensory: 0 9 Best Language: 3 10 Dysarthria: 2 11 Extinct. and Inatten.: 0 TOTAL: 22  Labs I have reviewed labs in epic and the results pertinent to this consultation are: CBC    Component Value Date/Time   WBC 9.3 01/02/2021 1537   RBC 4.69 01/19/2021 1537   HGB 12.2 12/26/2020 1545   HGB 13.7 03/01/2012 1310   HCT 36.0 01/07/2021 1545   HCT 40.8 03/01/2012 1310   PLT 212 12/27/2020 1537   PLT 168 03/01/2012 1310   MCV 82.9 12/26/2020 1537   MCV 91.8 03/01/2012 1310   MCH 25.6 (L) 01/20/2021 1537   MCHC 30.8 01/13/2021 1537   RDW 17.2 (H) 12/23/2020 1537   RDW 13.9 03/01/2012 1310   LYMPHSABS 2.2 12/23/2020 1537   LYMPHSABS 1.9 03/01/2012 1310   MONOABS 1.0 01/01/2021 1537   MONOABS 0.7 03/01/2012 1310   EOSABS 0.1 01/07/2021 1537   EOSABS 0.1 03/01/2012 1310   BASOSABS 0.0 12/22/2020 1537   BASOSABS 0.1 03/01/2012 1310    CMP     Component Value Date/Time   NA 143 01/13/2021 1545   K 3.4 (L) 01/05/2021 1545   CL 108 01/14/2021 1545   CO2 24 09/11/2020 0631   GLUCOSE 143 (H) 01/03/2021 1545   BUN 19 01/16/2021 1545   CREATININE 1.10 (H) 12/31/2020 1545   CREATININE 0.89 (H) 07/22/2020 1142   CALCIUM 8.1 (L) 09/11/2020 0631   PROT 5.5 (L) 09/11/2020 0631   ALBUMIN 2.0 (L) 09/11/2020 0631   AST 110 (H) 09/11/2020 0631   ALT 151 (H) 09/11/2020 0631   ALKPHOS 108 09/11/2020 0631   BILITOT 1.0 09/11/2020 0631   GFRNONAA 56 (L) 09/11/2020 0631   GFRNONAA 53 (L) 07/22/2020 1142   GFRAA 62 07/22/2020 1142    Lipid Panel     Component Value Date/Time   CHOL 135 07/22/2020 1142   TRIG 58 07/22/2020 1142   HDL 69 07/22/2020 1142   CHOLHDL 2.0 07/22/2020 1142   VLDL 13.2 05/05/2019 1342   LDLCALC 53 07/22/2020 1142   LDLDIRECT 51.0 12/21/2019 1626   Imaging I have reviewed the  images obtained: CT-scan of the brain IMPRESSION: 1. Large area of hemorrhage in the left temporoparietal lobe which appears parenchymal and subarachnoid. There is a moderately large left-sided subdural hematoma and left tentorial subdural hematoma. 2 x 3 cm hematoma right frontal lobe. Mild midline shift to the right. Differential diagnosis includes trauma, coagulopathy, amyloid, and hypertension. Review of the prior MRI does reveal numerous areas of chronic microhemorrhage in the brain which could be due to amyloid or hypertension. Chronic left-sided subdural hematoma noted on the prior CT and MRI.   Assessment:  Patient is a 87 year old woman with past medical history of multiple ICH, subarachnoid and subdural hematomas likely secondary to underlying cerebral amyloid angiopathy presenting with sudden change in her neurological status  with bilateral weakness, questionable pupillary asymmetry, left gaze preference. On my examination, she is very drowsy, has weakness and both her upper and lower extremities and is unresponsive to voice and minimally withdraws to noxious stimulation. She has a very large area of left temporoparietal bleed along with subarachnoid bleed overlying it along with a left-sided subdural hematoma and left tentorial subdural hematoma along with eight 2 cm x 3 cm right frontal hematoma. Given her advanced age as well as large bilateral intraparenchymal, subarachnoid and subdural hematomas, etiology is likely the underlying cerebral amyloid angiopathy. As far as management is concerned, I think at this point, given this is her recurrent bleed but this time the biggest of them all, I think the stress should be on her comfort as this might eventually be fatal for her. Surgical management at her advanced age and with these multifocal bleeds is not an option. Medical management also will end up essentially not being of much use. I discussed this in detail with the family and  the emergency room attending.  Impression:  Large intracerebral, subarachnoid and subdural hematomas secondary to cerebral amyloid angiopathy and hypertensive emergency.  DNR, Comfort Measures Only  Recommendations:  At this time, the patient is a DNR with comfort measures,  I would recommend palliative care consultation and admission for hospice care.  Blood pressures can be managed if desired- if full scope of care is pursued, her blood pressure should be less than 387 systolic.  I have started her on labetalol as needed and Cleviprex drip.  Once palliative care team is on board and comfort measures are initiated, antihypertensives can be discontinued. Stroke neurology will be available for assistance as needed. Please call with questions. Discussed in the emergency room with Dr. Ashok Cordia and the patient's daughter at bedside.  -- [name], MD Neurologist [facility] Pager: [phone]  Present on admission: Hemiplegia, ICH, cerebral edema, hypertensive emergency, cerebral amyloid angiopathy, possible aspiration pneumonia, DNR-comfort measures  CRITICAL CARE ATTESTATION Performed by: [name], MD Total critical care time: 60 minutes Critical care time was exclusive of separately billable procedures and treating other patients and/or supervising APPs/Residents/Students Critical care was necessary to treat or prevent imminent or life-threatening deterioration due [place], HTN emergency,cerebral amyloid angiopathy  This patient is critically ill and at significant risk for neurological worsening and/or death and care requires constant monitoring. Critical care was time spent personally by me on the following activities: development of treatment plan with patient and/or surrogate as well as nursing, discussions with consultants, evaluation of patient's response to treatment, examination of patient, obtaining history from patient or surrogate, ordering and performing  treatments and interventions, ordering and review of laboratory studies, ordering and review of radiographic studies, pulse oximetry, re-evaluation of patient's condition, participation in multidisciplinary rounds and medical decision making of high complexity in the care of this patient.

## 2021-01-08 NOTE — Telephone Encounter (Signed)
FYI   Patient had a fall and has been taken to [facility]   isiti lolara - [phone]

## 2021-01-08 NOTE — Telephone Encounter (Signed)
Noted  

## 2021-01-08 NOTE — H&P (Signed)
[facility] History and Physical   Patient: [name] UKG:[id number]   PCP: Marin Olp, MD DOB: Mar 19, 1918   DOA: 12/27/2020   DOS: 01/12/2021   DOS: the patient was seen and examined on 01/18/2021 Patient coming from: The patient is coming from Home  Chief Complaint: Shortness of  HPI: [name] is a 87 y.o. female with Past medical history of ICH, HTN, HLD, macular degeneration, cerebral amyloid angiopathy. Patient presented to the hospital with complaints of fall and confusion. Reported patient had a sudden onset of weakness.  She slid down to the ground from chair.  EMS was called.  Pupils were unequal and had a gaze preference and therefore code stroke was called.  Further information is still not available as the patient is nonverbal. The patient similar event in the past relating to [place] with complete recovery per family. No nausea no vomiting.  No diarrhea no constipation.  No recent change in medication reported by the family.  ED Course: Presented as a code stroke.  CT scan showed evidence of new ICH.  Neurology consulted.  Family decided to transition to comfort care.  Palliative care was also consulted.  Today refer the patient to hospitalist service for admission.  Review of Systems: as mentioned in the history of present illness.  All other systems reviewed and are negative.  Past Medical History:  Diagnosis Date  . Hypertension   . Macular degeneration of left eye   . Osteopenia   . Stroke [facility])    Past Surgical History:  Procedure Laterality Date  . CATARACT EXTRACTION     Social History:  reports that she has never smoked. She has never used smokeless tobacco. She reports current alcohol use of about 1.0 standard drink of alcohol per week. She reports that she does not use drugs.  Allergies  Allergen Reactions  . Ace Inhibitors     REACTION: angioedema  . Other     Blood Thinners Cause Brain Bleeds    Family history reviewed and not  pertinent Family History  Problem Relation Age of Onset  . Cancer Daughter        breast     Prior to Admission medications   Medication Sig Start Date End Date Taking? Authorizing Provider  diclofenac Sodium (VOLTAREN) 1 % GEL APPLY 2 G TOPICALLY FOUR TIMES DAILY. 09/11/20 09/11/21  Angiulli, Lavon Paganini, PA-C  lidocaine (LIDODERM) 5 % PLACE 1 PATCH ONTO THE SKIN DAILY. REMOVE & DISCARD PATCH WITHIN 12 HOURS OR AS DIRECTED BY MD 09/11/20 09/11/21  Cathlyn Parsons, PA-C  losartan (COZAAR) 25 MG tablet Take 3 tablets (75 mg total) by mouth daily. 11/19/20   Marin Olp, MD  losartan (COZAAR) 25 MG tablet TAKE 3 TABLETS (75 MG TOTAL) BY MOUTH DAILY. 10/14/20 10/14/21  Angiulli, Lavon Paganini, PA-C  Multiple Vitamin (MULTIVITAMIN) tablet Take 1 tablet by mouth daily.    [provider]    Physical Exam: Vitals:   01/05/2021 2000 01/02/2021 2015 12/24/2020 2030 01/01/2021 2045  BP:    (!) 190/114  Pulse: 70 76 71 75  Resp: 19 (!) 29 (!) 21 (!) 23  SpO2: 99% 98% 96% 93%    General: obtunded and not oriented to time, place, and person. Appear in mild distress, affect unresponsive Eyes: Pupils unequal but reactive, Conjunctiva normal ENT: Oral Mucosa Clear, dry  Neck: difficult to assess  JVD, no Abnormal Mass Or lumps Cardiovascular: S1 and S2 Present, aortic systolic  Murmur  Respiratory: good respiratory effort, Bilateral Air entry equal and Decreased, no signs of accessory muscle use, Clear to Auscultation, no Crackles, no wheezes Abdomen: Bowel Sound present, Soft  Skin: no rashes  Extremities: no Pedal edema,  Neurologic: Drowsy, unable to follow any commands, nonverbal, withdraws to painful stimuli. Gait not checked due to patient safety concerns  Data Reviewed: I have personally reviewed and interpreted labs, imaging as discussed below.  CBC: Recent Labs  Lab January 25, 2021 1537 2021/01/25 1545  WBC 9.3  --   NEUTROABS 5.8  --   HGB 12.0 12.2  HCT 38.9 36.0  MCV 82.9  --   PLT  212  --    Basic Metabolic Panel: Recent Labs  Lab 2021-01-25 1537 25-Jan-2021 1545  NA 143 143  K 3.4* 3.4*  CL 109 108  CO2 25  --   GLUCOSE 146* 143*  BUN 17 19  CREATININE 1.20* 1.10*  CALCIUM 8.7*  --    GFR: CrCl cannot be calculated (Unknown ideal weight.). Liver Function Tests: Recent Labs  Lab Jan 25, 2021 1537  AST 20  ALT 17  ALKPHOS 66  BILITOT 0.8  PROT 7.1  ALBUMIN 3.4*   No results for input(s): LIPASE, AMYLASE in the last 168 hours. No results for input(s): AMMONIA in the last 168 hours. Coagulation Profile: Recent Labs  Lab 01/25/2021 1537  INR 1.2   Cardiac Enzymes: No results for input(s): CKTOTAL, CKMB, CKMBINDEX, TROPONINI in the last 168 hours. BNP (last 3 results) No results for input(s): PROBNP in the last 8760 hours. HbA1C: No results for input(s): HGBA1C in the last 72 hours. CBG: No results for input(s): GLUCAP in the last 168 hours. Lipid Profile: No results for input(s): CHOL, HDL, LDLCALC, TRIG, CHOLHDL, LDLDIRECT in the last 72 hours. Thyroid Function Tests: No results for input(s): TSH, T4TOTAL, FREET4, T3FREE, THYROIDAB in the last 72 hours. Anemia Panel: No results for input(s): VITAMINB12, FOLATE, FERRITIN, TIBC, IRON, RETICCTPCT in the last 72 hours. Urine analysis:    Component Value Date/Time   COLORURINE YELLOW 09/06/2020 0403   APPEARANCEUR HAZY (A) 09/06/2020 0403   LABSPEC 1.013 09/06/2020 0403   PHURINE 8.0 09/06/2020 0403   GLUCOSEU NEGATIVE 09/06/2020 0403   GLUCOSEU NEGATIVE 04/28/2017 1155   HGBUR NEGATIVE 09/06/2020 0403   BILIRUBINUR NEGATIVE 09/06/2020 0403   BILIRUBINUR neg 09/27/2015 1130   KETONESUR NEGATIVE 09/06/2020 0403   PROTEINUR NEGATIVE 09/06/2020 0403   UROBILINOGEN 0.2 04/28/2017 1155   NITRITE NEGATIVE 09/06/2020 0403   LEUKOCYTESUR NEGATIVE 09/06/2020 0403    Radiological Exams on Admission: CT HEAD CODE STROKE WO CONTRAST  Result Date: January 25, 2021 CLINICAL DATA:  Code stroke. Acute neuro  deficit. Left-sided weakness. EXAM: CT HEAD WITHOUT CONTRAST TECHNIQUE: Contiguous axial images were obtained from the base of the skull through the vertex without intravenous contrast. COMPARISON:  CT head and MRI head 09/05/2020 FINDINGS: Brain: Acute hemorrhage in the left temporal and parietal lobe with surrounding edema. Some of this appears parenchymal blood in some appears to be within the sylvian fissure. In addition, there is left-sided subdural hematoma around the left temporal lobe extending into the left parietal lobe measuring up to 5 mm. There is acute subdural along the left tentorium. There is a smaller 2 x 3 cm hematoma in the right frontal convexity. No ventricular hemorrhage or hydrocephalus. Mild midline shift to the right due to mass-effect. Moderate atrophy. Chronic infarct right temporal lobe. Chronic microvascular ischemic change in the white matter. 1 cm hyperdense lesion right frontal  para falcine region compatible with meningioma unchanged from prior CT and MRI. Vascular: Negative for hyperdense vessel. Skull: Negative Sinuses/Orbits: Mild mucosal edema paranasal sinuses. Bilateral cataract extraction Other: None ASPECTS ([place] Stroke Program Early CT Score) Not calculated due to multiple areas of acute hemorrhage. IMPRESSION: 1. Large area of hemorrhage in the left temporoparietal lobe which appears parenchymal and subarachnoid. There is a moderately large left-sided subdural hematoma and left tentorial subdural hematoma. 2 x 3 cm hematoma right frontal lobe. Mild midline shift to the right. Differential diagnosis includes trauma, coagulopathy, amyloid, and hypertension. Review of the prior MRI does reveal numerous areas of chronic microhemorrhage in the brain which could be due to amyloid or hypertension. Chronic left-sided subdural hematoma noted on the prior CT and MRI. 2. These results were called by telephone at the time of interpretation on 17-Jan-2021 at 3:58 pm to provider Rory Percy ,  who verbally acknowledged these results. Electronically Signed   By: Franchot Gallo M.D.   On: 01-17-21 15:59    I reviewed all nursing notes, pharmacy notes, vitals, pertinent old records.  Assessment/Plan 1. Intracranial bleeding ([place]) Presented as a code stroke.  CT scan shows bilateral intraparenchymal, subarachnoid as well as subdural hematoma. This is likely in response to her prior history of cerebral amyloid angiopathy. Currently patient has midline shift as well. Given that her prognosis is poor and surgical management is not an option given her advanced age and ongoing recurrent bleed history family decided to opt for comfort care. Palliative care consulted. Likely will require hospice on discharge. Given that the patient had a near fall at her facility she will require medical examiner referral.  2.  Malignant hypertension Presents with intracranial hemorrhage. Currently comfort care. Was on Cleviprex drip.  Now comfort care.  Atrial fibrillation, chronic (HCC) Not on anticoagulation due to recurrent bleeding. No further work-up. Comfort care.  Chronic kidney disease, stage 3a ([place]) Renal function appears baseline. Monitor blood  DNR (do not resuscitate) Present on admission.  Hypokalemia Mild. No further work-up or Comfort care.  Nutrition: Comfort diet DVT Prophylaxis: Comfort care  Advance goals of care discussion: DNR   Consults: Neurology, palliative care  Family Communication: family was present at bedside, at the time of interview.  Opportunity was given to ask question and all questions were answered satisfactorily.   Disposition:  From: Home Likely will need residential hospice on discharge.   Author: Berle Mull, MD [facility] 17-Jan-2021 9:00 PM   To reach On-call, see care teams to locate the attending and reach out to them via [email or web address]. If 7PM-7AM, please contact night-coverage If you still have difficulty reaching the  attending provider, please page the [facility] (Director on Call) for [facility] on amion for assistance.

## 2021-01-08 NOTE — Progress Notes (Signed)
Patient arrived from ED via stretcher accompanied by family. Patient received bath and peri care at this time. Suction and purwick set up for comfort. RN discussed with patient's family the POC. Family aware to notify RN for additional needs.  POC: Frequent rounds to check for distress and provide comfort.  Continue to provide emotional support to family.

## 2021-01-08 NOTE — ED Notes (Signed)
Pt resting comfortably at this time. Family at bedside. Family understands to make RN aware if pt seems to be in any distress.

## 2021-01-08 NOTE — ED Provider Notes (Signed)
[facility] EMERGENCY DEPARTMENT Provider Note   CSN: [id number] Arrival date & time: 01/21/2021  1533  An emergency department physician performed an initial assessment on this suspected stroke patient at 69.  History No chief complaint on file.   [name] is a 87 y.o. female.  Patient with hx sah, htn, advanced age, presents with acute alteration of mental status as code stroke activation. Pt not verbally responsive to questions - level 5 caveat. No report of trauma/fall. No report of fevers. No vomiting. No anticoagulant use.   The history is provided by the patient and the EMS personnel. The history is limited by the condition of the patient.       Past Medical History:  Diagnosis Date  . Hypertension   . Macular degeneration of left eye   . Osteopenia   . Stroke [facility])     Patient Active Problem List   Diagnosis Date Noted  . Acute blood loss anemia   . Pain   . Unstageable decubitus ulcer ([place])   . Labile blood pressure   . Pressure injury of skin 08/28/2020  . Leukocytosis   . Hypoalbuminemia due to protein-calorie malnutrition ([place])   . Essential hypertension   . Dyslipidemia   . Transaminitis   . Closed displaced fracture of head of right radius   . Traumatic subdural hematoma ([place]) 08/26/2020  . LFT elevation   . Disorientation 08/17/2020  . SAH (subarachnoid hemorrhage) ([place]) 08/13/2020  . Subdural hematoma ([place]) 08/12/2020  . Fall at home, initial encounter 08/12/2020  . Subarachnoid hemorrhage following injury ([place]) 08/12/2020  . Macular degeneration 04/16/2020  . 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 Intermediate stage nonexudative age-related macular degeneration of both eyes 04/02/2020  . Chronic kidney disease, stage 3a ([place]) 08/01/2018  . Meningioma ([place]) 03/31/2018  . History of CVA in adulthood 03/31/2018  . Mixed hyperlipidemia 03/15/2018   . Urinary incontinence 04/28/2017  . Reactive depression 08/14/2015  . Intention tremor 06/21/2015  . Cerebral amyloid angiopathy ([place]) 05/29/2015  . Atrial fibrillation, chronic ([place]) 03/29/2015  . Altered mental status   . History of intracerebral hemorrhage    . Dizzy spells 11/10/2014  . SPINAL STENOSIS, CERVICAL 06/11/2010  . Hypertensive urgency 06/22/2006  . Arthritis of right knee 06/22/2006  . Osteopenia 06/22/2006    Past Surgical History:  Procedure Laterality Date  . CATARACT EXTRACTION       OB History    Gravida  2   Para      Term      Preterm      AB      Living  1     SAB      IAB      Ectopic      Multiple      Live Births              Family History  Problem Relation Age of Onset  . Cancer Daughter        breast    Social History   Tobacco Use  . Smoking status: Never Smoker  . Smokeless tobacco: Never Used  Vaping Use  . Vaping Use: Never used  Substance Use Topics  . Alcohol use: Yes    Alcohol/week: 1.0 standard drink    Types: 1 Glasses of wine per week    Comment: 1 glass with dinner  . Drug use: No    Home Medications Prior  to Admission medications   Medication Sig Start Date End Date Taking? Authorizing Provider  diclofenac Sodium (VOLTAREN) 1 % GEL APPLY 2 G TOPICALLY FOUR TIMES DAILY. 09/11/20 09/11/21  Angiulli, Lavon Paganini, PA-C  lidocaine (LIDODERM) 5 % PLACE 1 PATCH ONTO THE SKIN DAILY. REMOVE & DISCARD PATCH WITHIN 12 HOURS OR AS DIRECTED BY MD 09/11/20 09/11/21  Cathlyn Parsons, PA-C  losartan (COZAAR) 25 MG tablet Take 3 tablets (75 mg total) by mouth daily. 11/19/20   Marin Olp, MD  losartan (COZAAR) 25 MG tablet TAKE 3 TABLETS (75 MG TOTAL) BY MOUTH DAILY. 10/14/20 10/14/21  Angiulli, Lavon Paganini, PA-C  Multiple Vitamin (MULTIVITAMIN) tablet Take 1 tablet by mouth daily.    [provider]    Allergies    Ace inhibitors and Other  Review of Systems   Review of Systems  Unable to perform  ROS: Patient unresponsive  patient unresponsive - level 5 caveat.     Physical Exam Updated Vital Signs BP (!) 178/86   Pulse (!) 55   Resp (!) 22   SpO2 (!) 87%   Physical Exam Vitals and nursing note reviewed.  Constitutional:      Appearance: Normal appearance. She is well-developed.  HENT:     Head: Atraumatic.     Nose: Nose normal.     Mouth/Throat:     Mouth: Mucous membranes are moist.  Eyes:     General: No scleral icterus.    Conjunctiva/sclera: Conjunctivae normal.     Pupils: Pupils are equal, round, and reactive to light.  Neck:     Vascular: No carotid bruit.     Trachea: No tracheal deviation.  Cardiovascular:     Rate and Rhythm: Normal rate and regular rhythm.     Pulses: Normal pulses.     Heart sounds: Normal heart sounds. No murmur heard. No friction rub. No gallop.   Pulmonary:     Effort: Pulmonary effort is normal. No respiratory distress.     Breath sounds: Normal breath sounds.  Abdominal:     General: Bowel sounds are normal. There is no distension.     Palpations: Abdomen is soft.     Tenderness: There is no abdominal tenderness.  Genitourinary:    Comments: No cva tenderness.  Musculoskeletal:        General: No swelling or tenderness.     Cervical back: Normal range of motion and neck supple. No rigidity. No muscular tenderness.  Skin:    General: Skin is warm and dry.     Findings: No rash.  Neurological:     Mental Status: She is alert.     Comments: Awake, alert appearing. Does not follow commands. Breathing/handling secretions without difficulty.   Psychiatric:     Comments: Alert appearing.      ED Results / Procedures / Treatments   Labs (all labs ordered are listed, but only abnormal results are displayed) Results for orders placed or performed during the hospital encounter of 01/14/2021  Protime-INR  Result Value Ref Range   Prothrombin Time 14.8 11.4 - 15.2 seconds   INR 1.2 0.8 - 1.2  APTT  Result Value Ref Range    aPTT 26 24 - 36 seconds  CBC  Result Value Ref Range   WBC 9.3 4.0 - 10.5 K/uL   RBC 4.69 3.87 - 5.11 MIL/uL   Hemoglobin 12.0 12.0 - 15.0 g/dL   HCT 38.9 36.0 - 46.0 %   MCV 82.9 80.0 - 100.0  fL   MCH 25.6 (L) 26.0 - 34.0 pg   MCHC 30.8 30.0 - 36.0 g/dL   RDW 17.2 (H) 11.5 - 15.5 %   Platelets 212 150 - 400 K/uL   nRBC 0.0 0.0 - 0.2 %  Differential  Result Value Ref Range   Neutrophils Relative % 62 %   Neutro Abs 5.8 1.7 - 7.7 K/uL   Lymphocytes Relative 24 %   Lymphs Abs 2.2 0.7 - 4.0 K/uL   Monocytes Relative 11 %   Monocytes Absolute 1.0 0.1 - 1.0 K/uL   Eosinophils Relative 2 %   Eosinophils Absolute 0.1 0.0 - 0.5 K/uL   Basophils Relative 0 %   Basophils Absolute 0.0 0.0 - 0.1 K/uL   Immature Granulocytes 1 %   Abs Immature Granulocytes 0.06 0.00 - 0.07 K/uL  Comprehensive metabolic panel  Result Value Ref Range   Sodium 143 135 - 145 mmol/L   Potassium 3.4 (L) 3.5 - 5.1 mmol/L   Chloride 109 98 - 111 mmol/L   CO2 25 22 - 32 mmol/L   Glucose, Bld 146 (H) 70 - 99 mg/dL   BUN 17 8 - 23 mg/dL   Creatinine, Ser 1.20 (H) 0.44 - 1.00 mg/dL   Calcium 8.7 (L) 8.9 - 10.3 mg/dL   Total Protein 7.1 6.5 - 8.1 g/dL   Albumin 3.4 (L) 3.5 - 5.0 g/dL   AST 20 15 - 41 U/L   ALT 17 0 - 44 U/L   Alkaline Phosphatase 66 38 - 126 U/L   Total Bilirubin 0.8 0.3 - 1.2 mg/dL   GFR, Estimated 40 (L) >60 mL/min   Anion gap 9 5 - 15  I-stat chem 8, ED  Result Value Ref Range   Sodium 143 135 - 145 mmol/L   Potassium 3.4 (L) 3.5 - 5.1 mmol/L   Chloride 108 98 - 111 mmol/L   BUN 19 8 - 23 mg/dL   Creatinine, Ser 1.10 (H) 0.44 - 1.00 mg/dL   Glucose, Bld 143 (H) 70 - 99 mg/dL   Calcium, Ion 1.06 (L) 1.15 - 1.40 mmol/L   TCO2 24 22 - 32 mmol/L   Hemoglobin 12.2 12.0 - 15.0 g/dL   HCT 36.0 36.0 - 46.0 %   Intravitreal Injection, Pharmacologic Agent - OS - Left Eye  Result Date: 12/10/2020 Time Out 12/10/2020. 11:11 AM. Confirmed correct patient, procedure, site, and patient  consented. Anesthesia Topical anesthesia was used. Anesthetic medications included Ak[place] 3.5%. Procedure Preparation included Tobramycin 0.3%, 10% betadine to eyelids, 5% betadine to ocular surface. A supplied needle was used. Injection: 2.5 mg Bevacizumab (AVASTIN) 2.5mg /0.10mL SOSY   NDC: 79024-097-35, Lot: [id number]   Route: Intravitreal, Site: Left Eye Post-op Post injection exam found visual acuity of at least counting fingers. The patient tolerated the procedure well. There were no complications. The patient received written and verbal post procedure care education. Post injection medications were not given.   OCT, Retina - OU - Both Eyes  Result Date: 12/10/2020 Right Eye Quality was good. Scan locations included subfoveal. Central Foveal Thickness: 244. Progression has been stable. Findings include normal observations, retinal drusen , no SRF, no IRF. Left Eye Quality was good. Scan locations included subfoveal. Central Foveal Thickness: 408. Progression has worsened. Findings include pigment epithelial detachment, abnormal foveal contour, subretinal fluid, intraretinal fluid. Notes Today at 44-month follow-up after missed appointments due to health issues, increased subretinal fluid as well as intraretinal fluid accompanied by acuity change, OS, will repeat injection Avastin  today  CT HEAD CODE STROKE WO CONTRAST  Result Date: 01/07/2021 CLINICAL DATA:  Code stroke. Acute neuro deficit. Left-sided weakness. EXAM: CT HEAD WITHOUT CONTRAST TECHNIQUE: Contiguous axial images were obtained from the base of the skull through the vertex without intravenous contrast. COMPARISON:  CT head and MRI head 09/05/2020 FINDINGS: Brain: Acute hemorrhage in the left temporal and parietal lobe with surrounding edema. Some of this appears parenchymal blood in some appears to be within the sylvian fissure. In addition, there is left-sided subdural hematoma around the left temporal lobe extending into the left parietal lobe  measuring up to 5 mm. There is acute subdural along the left tentorium. There is a smaller 2 x 3 cm hematoma in the right frontal convexity. No ventricular hemorrhage or hydrocephalus. Mild midline shift to the right due to mass-effect. Moderate atrophy. Chronic infarct right temporal lobe. Chronic microvascular ischemic change in the white matter. 1 cm hyperdense lesion right frontal para falcine region compatible with meningioma unchanged from prior CT and MRI. Vascular: Negative for hyperdense vessel. Skull: Negative Sinuses/Orbits: Mild mucosal edema paranasal sinuses. Bilateral cataract extraction Other: None ASPECTS ([place] Stroke Program Early CT Score) Not calculated due to multiple areas of acute hemorrhage. IMPRESSION: 1. Large area of hemorrhage in the left temporoparietal lobe which appears parenchymal and subarachnoid. There is a moderately large left-sided subdural hematoma and left tentorial subdural hematoma. 2 x 3 cm hematoma right frontal lobe. Mild midline shift to the right. Differential diagnosis includes trauma, coagulopathy, amyloid, and hypertension. Review of the prior MRI does reveal numerous areas of chronic microhemorrhage in the brain which could be due to amyloid or hypertension. Chronic left-sided subdural hematoma noted on the prior CT and MRI. 2. These results were called by telephone at the time of interpretation on 01/01/2021 at 3:58 pm to provider Rory Percy , who verbally acknowledged these results. Electronically Signed   By: Franchot Gallo M.D.   On: 01/05/2021 15:59    EKG EKG Interpretation  Date/Time:  Wednesday Jan 08 2021 15:51:14 EDT Ventricular Rate:  64 PR Interval:    QRS Duration: 93 QT Interval:  486 QTC Calculation: 502 R Axis:   5 Text Interpretation: Atrial fibrillation Prolonged QT interval Confirmed by Lajean Saver [phone]) on 01/01/2021 4:07:20 PM   Radiology CT HEAD CODE STROKE WO CONTRAST  Result Date: 01/20/2021 CLINICAL DATA:  Code stroke.  Acute neuro deficit. Left-sided weakness. EXAM: CT HEAD WITHOUT CONTRAST TECHNIQUE: Contiguous axial images were obtained from the base of the skull through the vertex without intravenous contrast. COMPARISON:  CT head and MRI head 09/05/2020 FINDINGS: Brain: Acute hemorrhage in the left temporal and parietal lobe with surrounding edema. Some of this appears parenchymal blood in some appears to be within the sylvian fissure. In addition, there is left-sided subdural hematoma around the left temporal lobe extending into the left parietal lobe measuring up to 5 mm. There is acute subdural along the left tentorium. There is a smaller 2 x 3 cm hematoma in the right frontal convexity. No ventricular hemorrhage or hydrocephalus. Mild midline shift to the right due to mass-effect. Moderate atrophy. Chronic infarct right temporal lobe. Chronic microvascular ischemic change in the white matter. 1 cm hyperdense lesion right frontal para falcine region compatible with meningioma unchanged from prior CT and MRI. Vascular: Negative for hyperdense vessel. Skull: Negative Sinuses/Orbits: Mild mucosal edema paranasal sinuses. Bilateral cataract extraction Other: None ASPECTS ([place] Stroke Program Early CT Score) Not calculated due to multiple areas of acute hemorrhage. IMPRESSION:  1. Large area of hemorrhage in the left temporoparietal lobe which appears parenchymal and subarachnoid. There is a moderately large left-sided subdural hematoma and left tentorial subdural hematoma. 2 x 3 cm hematoma right frontal lobe. Mild midline shift to the right. Differential diagnosis includes trauma, coagulopathy, amyloid, and hypertension. Review of the prior MRI does reveal numerous areas of chronic microhemorrhage in the brain which could be due to amyloid or hypertension. Chronic left-sided subdural hematoma noted on the prior CT and MRI. 2. These results were called by telephone at the time of interpretation on 12/24/2020 at 3:58 pm to  provider Rory Percy , who verbally acknowledged these results. Electronically Signed   By: Franchot Gallo M.D.   On: 12/27/2020 15:59    Procedures Procedures   Medications Ordered in ED Medications  clevidipine (CLEVIPREX) infusion 0.5 mg/mL (1 mg/hr Intravenous New Bag/Given 01/07/2021 1555)  sodium chloride flush (NS) 0.9 % injection 3 mL (3 mLs Intravenous Given 01/05/2021 1554)  labetalol (NORMODYNE) injection 10 mg (10 mg Intravenous Given 12/23/2020 1554)    ED Course  I have reviewed the triage vital signs and the nursing notes.  Pertinent labs & imaging results that were available during my care of the patient were reviewed by me and considered in my medical decision making (see chart for details).    MDM Rules/Calculators/A&P                         Iv ns. Continuous pulse ox and cardiac monitoring. Stat ct. Code stroke activation pta. Neurology emergently consulted and at bedside. Labs sent stat.   Reviewed nursing notes and prior charts for additional history.   Labs reviewed/interpreted by me - wbc normal. K sl low.   CLeviprex gtt per neurology.   CT reviewed/interpreted by me - sah, sdh, and intraparenchymal hem.  Neurology has reviewed CT and recommends comfort/palliaitive care.   Palliative Care consulted.   CRITICAL CARE RE: acute alteration in mental status, code stroke activation, SAH/SDH/Intraparenchymal hemorrhage.  Performed by: Mirna Mires Total critical care time: 40 minutes Critical care time was exclusive of separately billable procedures and treating other patients. Critical care was necessary to treat or prevent imminent or life-threatening deterioration. Critical care was time spent personally by me on the following activities: development of treatment plan with patient and/or surrogate as well as nursing, discussions with consultants, evaluation of patient's response to treatment, examination of patient, obtaining history from patient or surrogate, ordering  and performing treatments and interventions, ordering and review of laboratory studies, ordering and review of radiographic studies, pulse oximetry and re-evaluation of patient's condition.    Final Clinical Impression(s) / ED Diagnoses Final diagnoses:  None    Rx / DC Orders ED Discharge Orders    None       Lajean Saver, MD 01/08/21 [phone]

## 2021-01-08 NOTE — Consult Note (Signed)
Consultation Note Date: 12/26/2020   Patient Name: [name]  DOB: 1917/10/19  MRN: [id number]  Age / Sex: 87 y.o., female  PCP: Marin Olp, MD Referring Physician: Lajean Saver, MD  Reason for Consultation: Establishing goals of care  HPI/Patient Profile: 87 y.o. female  with past medical history of previous ICH, hypertension, hyperlipidemia, and macular degeneration presenting to the emergency department on 01/13/2021 as a code stroke. Reportedly was sweeping her apartment and slid to the ground. [facility] was called and arrived to find patient with unequal pupils, right facial droop, left gaze, aphasic, and no movement on the right side.  CT head results: large area of hemorrhage in the left temporoparietal lobe which appears parenchymal and subarachnoid. There is a moderately large left-sided subdural hematoma and left tentorial subdural hematoma. 2 x 3 cm hematoma right frontal lobe. Mild midline shift to the right.   Of note--history of previous ICH secondary to fall, hospitalized 08/11/20--08/16/20. During that admission, CT head showed SAH in the sulcus of left frontal lobe as well as SDH over the left frontoparietal convexity measuring 4 cm in maximal thickness. Neurosurgery recommended conservative management. She was discharged home but had persistent sleepiness and lethargy and was brought back to the ER, then admitted 08/17/20--08/26/20. Discharged to [facility] 08/26/20.   Clinical Assessment and Goals of Care: PMT provider was paged by ED physician Dr. Ashok Cordia and asked to see patient.   I have reviewed medical records including EPIC notes, labs and imaging, and examined the patient. She is unresponsive with roving eye movements.   I met at bedside with daughter/Joan to discuss diagnosis, prognosis, GOC, EOL wishes, disposition, and options.  I introduced Palliative Medicine as specialized medical  care for people living with serious illness. It focuses on providing relief from the symptoms and stress of a serious illness.   We discussed a brief life review of the patient. She is originally from [place]. She has been married to her husband (still living) for 52 years. They had 2 daughters, unfortunately one of them passed away 14 years ago from breast cancer. Patient and her husband relocated to [facility] about 27 years ago to be closer to [place]. They eventually ended up living with [name] for about 7 years. In mid March of 2022, patient and husband moved into [facility] assisted living.   As far as functional status, patient was ambulatory with a walker and independent with ADLs. She enjoyed going to "get her nails done" with her girlfriends at [facility].   We discussed her current illness and what it means in the larger context of her ongoing co-morbidities. [name] understands her mother has suffered a devastating brain bleed - she states "there's no coming back from this".  Natural trajectory at EOL was discussed.  The difference between full scope intervention and comfort care was discussed.  I made a strong recommendation to pursue comfort measures only, emphasizing the focus would be comfort and symptom management rather than prolonging life. [name] agrees fully that goal of care  is comfort only at this point.   Discussed transitioning to comfort care while in house, and what that would look like--keeping her clean and dry, no labs, no artificial hydration or feeding, no antibiotics, no further diagnostics, and medication for pain and dyspnea as needed.   Discussed that prognosis could be hours to days. Discussed plan to admit to the hospital for comfort care. Discussed that is patient survives through the night and is stable for transfer, we may be able to transfer to [facility] tomorrow depending on bed availability.   [name] shares that her father does not know anything yet. She is  tearful at the thought of telling him the bad news. She states he is going to be devastated. Emotional support provided.   [name] shares that their family is Catholic. I let her know that we have 24/7 chaplain coverage at the hospital but likely will not be able to have a priest come overnight. Encouraged [name] to reach out to her church family if able. In the meantime, PMT will reach out to the on-call chaplain and see if they have recommendations.  Questions and concerns were addressed.  The family was encouraged to call with questions or concerns.    Primary decision maker: Fredrich Birks (daughter)    SUMMARY OF RECOMMENDATIONS    Full comfort measures initiated  Recommend admit for comfort care (6N preferred)  DNR/DNI as previously documented  Daughter aware that prognosis is hours to days and patient may not survive the night  If stable in the morning, may consider transfer to [place] consult placed; hospice liaison notifed  Added orders for symptom management at EOL as well as discontinued orders that were not focused on comfort  Unrestricted visitation orders were placed   Provide frequent assessments and administer PRN medications as clinically necessary to ensure EOL comfort  Spiritual care consult - patient is Catholic  PMT will continue to follow holistically  Code Status/Advance Care Planning:  DNR  Symptom Management:   Morphine prn for pain or dyspnea  Lorazepam (ATIVAN) prn for anxiety  Haloperidol (HALDOL) prn for agitation   Glycopyrrolate (ROBINUL) for excessive secretions  Ondansetron (ZOFRAN) prn for nausea  Polyvinyl alcohol (LIQUIFILM TEARS) prn for dry eyes  Antiseptic oral rinse (BIOTENE) prn for dry mouth  Palliative Prophylaxis:   Oral Care and Turn Reposition  Additional Recommendations (Limitations, Scope, Preferences):  Full Comfort Care  Psycho-social/Spiritual:   Desire for further Chaplaincy support:yes  Prognosis:    Hours - Days  Discharge Planning: To Be Determined      Primary Diagnoses: Present on Admission: **None**   I have reviewed the medical record, interviewed the patient and family, and examined the patient. The following aspects are pertinent.  Past Medical History:  Diagnosis Date  . Hypertension   . Macular degeneration of left eye   . Osteopenia   . Stroke [facility])    Social History   Socioeconomic History  . Marital status: Married    Spouse name: Not on file  . Number of children: Not on file  . Years of education: Not on file  . Highest education level: Not on file  Occupational History  . Not on file  Tobacco Use  . Smoking status: Never Smoker  . Smokeless tobacco: Never Used  Vaping Use  . Vaping Use: Never used  Substance and Sexual Activity  . Alcohol use: Yes    Alcohol/week: 1.0 standard drink    Types: 1 Glasses of  wine per week    Comment: 1 glass with dinner  . Drug use: No  . Sexual activity: Not on file  Other Topics Concern  . Not on file  Social History Narrative   Married (husband Tom age 67 in [employer]) 67. 2 [place] oldest daughter to breast cancer. 4 grandkids.       Husband and patient live with daughter.       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       Hobbies: reading, had been quilting, enjoys cooking/baking.       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    Full Code.           Family History  Problem Relation Age of Onset  . Cancer Daughter        breast   Scheduled Meds: Continuous Infusions: PRN Meds:.acetaminophen **OR** acetaminophen, antiseptic oral rinse, glycopyrrolate **OR** glycopyrrolate **OR** glycopyrrolate, haloperidol **OR** haloperidol **OR** haloperidol lactate, LORazepam **OR** LORazepam **OR** LORazepam, morphine injection, ondansetron **OR** ondansetron (ZOFRAN) IV, polyvinyl alcohol Medications Prior to Admission:  Prior to Admission medications   Medication Sig  Start Date End Date Taking? Authorizing Provider  diclofenac Sodium (VOLTAREN) 1 % GEL APPLY 2 G TOPICALLY FOUR TIMES DAILY. 09/11/20 09/11/21  Angiulli, Lavon Paganini, PA-C  lidocaine (LIDODERM) 5 % PLACE 1 PATCH ONTO THE SKIN DAILY. REMOVE & DISCARD PATCH WITHIN 12 HOURS OR AS DIRECTED BY MD 09/11/20 09/11/21  Cathlyn Parsons, PA-C  losartan (COZAAR) 25 MG tablet Take 3 tablets (75 mg total) by mouth daily. 11/19/20   Marin Olp, MD  losartan (COZAAR) 25 MG tablet TAKE 3 TABLETS (75 MG TOTAL) BY MOUTH DAILY. 10/14/20 10/14/21  Angiulli, Lavon Paganini, PA-C  Multiple Vitamin (MULTIVITAMIN) tablet Take 1 tablet by mouth daily.    [provider]   Allergies  Allergen Reactions  . Ace Inhibitors     REACTION: angioedema  . Other     Blood Thinners Cause Brain Bleeds   Review of Systems  Unable to perform ROS: Patient nonverbal    Physical Exam Vitals reviewed.  Constitutional:      General: She is not in acute distress.    Appearance: She is ill-appearing.  Cardiovascular:     Rate and Rhythm: Normal rate.     Comments: SR with PVCs Pulmonary:     Effort: Pulmonary effort is normal.  Neurological:     Comments: Not following commands, not moving extremities Roving eye movements     Vital Signs: BP (!) 118/54   Pulse 62   Resp (!) 26   SpO2 95%        SpO2: SpO2: 95 % O2 Device:SpO2: 95 % O2 Flow Rate: .    Palliative Assessment/Data: PPS 10%     Time In: 1630 Time Out: 1745 Time Total: 75 minutes Greater than 50%  of this time was spent counseling and coordinating care related to the above assessment and plan.  Signed by: Lavena Bullion, NP   Please contact Palliative Medicine Team phone at [phone] for questions and concerns.  For individual provider: See Shea Evans

## 2021-01-09 DIAGNOSIS — I609 Nontraumatic subarachnoid hemorrhage, unspecified: Secondary | ICD-10-CM

## 2021-01-09 DIAGNOSIS — N1831 Chronic kidney disease, stage 3a: Secondary | ICD-10-CM

## 2021-01-09 DIAGNOSIS — Z515 Encounter for palliative care: Secondary | ICD-10-CM

## 2021-01-09 DIAGNOSIS — I482 Chronic atrial fibrillation, unspecified: Secondary | ICD-10-CM

## 2021-01-09 DIAGNOSIS — I629 Nontraumatic intracranial hemorrhage, unspecified: Secondary | ICD-10-CM

## 2021-01-09 MED ORDER — ORAL CARE MOUTH RINSE
15.0000 mL | Freq: Two times a day (BID) | OROMUCOSAL | Status: DC
  Administered 2021-01-09: 15 mL via OROMUCOSAL

## 2021-01-09 MED ORDER — MORPHINE SULFATE (PF) 2 MG/ML IV SOLN
0.5000 mg | INTRAVENOUS | Status: DC | PRN
  Administered 2021-01-09: 2 mg via INTRAVENOUS
  Administered 2021-01-09 (×2): 0.5 mg via INTRAVENOUS
  Filled 2021-01-09 (×3): qty 1

## 2021-01-22 NOTE — Progress Notes (Signed)
Attempted to place foley catheter X2. Unsuccessful. MD Posey Pronto paged. Will try again this afternoon.

## 2021-01-22 NOTE — Progress Notes (Signed)
[facility] given [facility]. Patient to be transported via [place]. IV sites intact per hospice wishes.

## 2021-01-22 NOTE — TOC Initial Note (Addendum)
Transition of Care [facility]) - Initial/Assessment Note    Patient Details  Name: [name] MRN: [id number] Date of Birth: 05-15-1918  Transition of [facility]) CM/SW Contact:    Marilu Favre, RN Phone Number: 01/11/2021, 10:51 AM  Clinical Narrative:                  Spoke to patient's daughter [name] cell [phone] at bedside. Discussed residential hospice, [name] in agreement . Offered choice. [name] prefers [facility].    Referral made to [facility] with [facility]. [place] will review referral and speak to [place] . No [facility] beds available today   1130 Received a message that [name] also interested in [facility] location . [address] [phone] and confirmed. NCM left message on HOP referral line.  1200 NCM spoke to [place] with [facility] [place] for their [facility] location.    1545 Received a call from [place] with [place], they have offered a bed at their [facility] location and daughter has accepted    1645 [facility] called.  Expected Discharge Plan: [place]     Patient Goals and CMS Choice     Choice offered to / list presented to : Adult Children [name])  Expected Discharge Plan and Services Expected Discharge Plan: [place]   Discharge Planning Services: CM Consult   Living arrangements for the past 2 months: Apartment                   DME Agency: NA       HH Arranged: NA          Prior Living Arrangements/Services Living arrangements for the past 2 months: Apartment Lives with:: Spouse                   Activities of Daily Living Home Assistive Devices/Equipment: None ADL Screening (condition at time of admission) Patient's cognitive ability adequate to safely complete daily activities?: No Is the patient deaf or have difficulty hearing?: No Does the patient have difficulty seeing, even when wearing glasses/contacts?: Yes Does the patient have difficulty concentrating,  remembering, or making decisions?: Yes Patient able to express need for assistance with ADLs?: No Does the patient have difficulty dressing or bathing?: Yes Independently performs ADLs?: No Does the patient have difficulty walking or climbing stairs?: Yes Weakness of Legs: Both Weakness of Arms/Hands: Both  Permission Sought/Granted                  Emotional Assessment              Admission diagnosis:  Intracranial bleeding ([place]) [I62.9] SAH (subarachnoid hemorrhage) ([place]) [I60.9] SDH (subdural hematoma) ([place]) [S06.5X9A] Acute idiopathic spontaneous intraparenchymal intracranial hemorrhage ([place]) [I62.9] Acute alteration in mental status [R41.82] Patient Active Problem List   Diagnosis Date Noted  . Subarachnoid hemorrhage 12/23/2020  . Intracranial bleeding ([place]) February 03, 2021  . DNR (do not resuscitate) 02/03/2021  . Hypokalemia 02-03-2021  . Malignant hypertension 02-03-2021  . Acute blood loss anemia   . Pain   . Unstageable decubitus ulcer ([place])   . Labile blood pressure   . Pressure injury of skin 08/28/2020  . Leukocytosis   . Hypoalbuminemia due to protein-calorie malnutrition ([place])   . Essential hypertension   . Dyslipidemia   . Transaminitis   . Closed displaced fracture of head of right radius   . Traumatic subdural hematoma ([place]) 08/26/2020  . LFT  elevation   . Disorientation 08/17/2020  . SAH (subarachnoid hemorrhage) ([place]) 08/13/2020  . Subdural hematoma ([place]) 08/12/2020  . Fall at home, initial encounter 08/12/2020  . Subarachnoid hemorrhage following injury ([place]) 08/12/2020  . Macular degeneration 04/16/2020  . Exudative age-related macular degeneration of left eye with active choroidal neovascularization ([place]) 04/02/2020  . Intermediate stage nonexudative age-related macular degeneration of both eyes 04/02/2020  . Chronic kidney disease, stage 3a ([place]) 08/01/2018  . Meningioma ([place]) 03/31/2018  . History of CVA in adulthood 03/31/2018  .  Mixed hyperlipidemia 03/15/2018  . Urinary incontinence 04/28/2017  . Reactive depression 08/14/2015  . Intention tremor 06/21/2015  . Cerebral amyloid angiopathy ([place]) 05/29/2015  . Atrial fibrillation, chronic ([place]) 03/29/2015  . Altered mental status   . History of intracerebral hemorrhage    . Dizzy spells 11/10/2014  . SPINAL STENOSIS, CERVICAL 06/11/2010  . Hypertensive urgency 06/22/2006  . Arthritis of right knee 06/22/2006  . Osteopenia 06/22/2006   PCP:  Marin Olp, MD Pharmacy:   [facility], [address], [address] [place], [place] [address] Phone: [phone] Fax: [place] [address] [place] [address] [place] [address] Phone: [phone] Fax: [phone]  [facility] [address] [place] [address] Phone: [phone] Fax: [phone]     Social Determinants of Health (SDOH) Interventions    Readmission Risk Interventions No flowsheet data found.

## 2021-01-22 NOTE — Progress Notes (Signed)
This chaplain responded PMT consult for EOL spiritual care.  The chaplain was updated by the Pt. RN-Lydia.  The Pt. daughter-Joan and grandson-Nick are at the bedside.  The chaplain understands the Catholic priest shared Last Rites with the Pt. last night.  The chaplain offered intercessory prayer and F/U spiritual care as needed with the Pt. and family.   The family is tearful as they participate in story telling and honoring the Pt. Legacy as a mother and grandmother.  [name] lifted up the many life events the Pt. lived through with her joyful spirit and husband by her side.

## 2021-01-22 NOTE — Progress Notes (Signed)
Patient resting comfortably in bed this morning. Family asking [place] to not wake her to reposition. Will frequently check in.

## 2021-01-22 NOTE — Discharge Summary (Signed)
[facility] Discharge Summary   Patient: [name] KGU:[id number]  PCP: Marin Olp, MD  Date of admission: 01/04/2021   Date of discharge:  01/28/2021     Discharge Diagnoses:  Principal Problem:   Intracranial bleeding ([place]) Active Problems:   Atrial fibrillation, chronic (HCC)   Cerebral amyloid angiopathy ([place])   Intention tremor   Chronic kidney disease, stage 3a ([place])   Fall at home, initial encounter   DNR (do not resuscitate)   Hypokalemia   Malignant hypertension   Subarachnoid hemorrhage   End of life care  Admitted From: ALF Disposition:  Residential hospice  Recommendations for Outpatient Follow-up:  1. Establish care with hospice  Discharge Instructions    Increase activity slowly   Complete by: As directed       Diet recommendation: comfort care  Activity: The patient is advised to gradually reintroduce usual activities, as tolerated  Discharge Condition: stable  Code Status: DNR   History of present illness: As per the H and P dictated on admission, "[name] is a 87 y.o. female with Past medical history of ICH, HTN, HLD, macular degeneration, cerebral amyloid angiopathy. Patient presented to the hospital with complaints of fall and confusion. Reported patient had a sudden onset of weakness.  She slid down to the ground from chair.  EMS was called.  Pupils were unequal and had a gaze preference and therefore code stroke was called.  Further information is still not available as the patient is nonverbal. The patient similar event in the past relating to [place] with complete recovery per family. No nausea no vomiting.  No diarrhea no constipation.  No recent change in medication reported by the family.  ED Course: Presented as a code stroke.  CT scan showed evidence of new ICH.  Neurology consulted.  Family decided to transition to comfort care.  Palliative care was also consulted.  Today refer the patient to hospitalist service for  admission."  Hospital Course:  Summary of her active problems in the hospital is as following.   Intracranial bleeding ([place]) Presented as a code stroke.  CT scan shows bilateral intraparenchymal, subarachnoid as well as subdural hematoma. This is likely in response to her prior history of cerebral amyloid angiopathy. Currently patient has midline shift as well. Given that her prognosis is poor and surgical management is not an option given her advanced age and ongoing recurrent bleed history family decided to opt for comfort care. Palliative care consulted. Require hospice on discharge.  Malignant hypertension Presents with intracranial hemorrhage. Currently comfort care. Was on Cleviprex drip.  Now comfort care.  Atrial fibrillation, chronic (HCC) Not on anticoagulation due to recurrent bleeding. No further work-up. Comfort care.  Chronic kidney disease, stage 3a ([place]) Renal function appears baseline. Monitor blood  DNR (do not resuscitate) Present on admission.  Hypokalemia Mild. No further work-up or Comfort care.  On the day of the discharge the patient's vitals were stable, and no other acute medical condition were reported by patient. The patient was felt safe to be discharge at residential hospice  Consultants: neurology, Palliative care  Procedures: none  DISCHARGE MEDICATION: Allergies as of 2021-01-28      Reactions   Ace Inhibitors    REACTION: angioedema   Other    Blood Thinners Cause Brain Bleeds      Medication List    STOP taking these medications   lidocaine 5 % Commonly known as: LIDODERM   losartan 25 MG tablet Commonly known  as: COZAAR   multivitamin tablet     TAKE these medications   diclofenac Sodium 1 % Gel Commonly known as: VOLTAREN APPLY 2 G TOPICALLY FOUR TIMES DAILY.      Discharge Exam: There were no vitals filed for this visit. Vitals:   15-Jan-2021 2045 01/11/2021 0447  BP: (!) 190/114 (!) 184/94  Pulse: 75 83   Resp: (!) 23 16  Temp:  (!) 100.6 F (38.1 C)  SpO2: 93% 96%   General: Appear in mild distress, no Rash; Oral Mucosa Clear, moist. no Abnormal Neck Mass Or lumps, Conjunctiva normal  Cardiovascular: S1 and S2 Present, no Murmur, Respiratory: good respiratory effort, Bilateral Air entry present and CTA, no Crackles, no wheezes Abdomen: Bowel Sound present, Soft and no tenderness Extremities: no Pedal edema Neurology: alert and oriented to time, place, and person affect appropriate. no new focal deficit Gait not checked due to patient safety concerns  The results of significant diagnostics from this hospitalization (including imaging, microbiology, ancillary and laboratory) are listed below for reference.    Significant Diagnostic Studies: CT HEAD CODE STROKE WO CONTRAST  Result Date: Jan 15, 2021 CLINICAL DATA:  Code stroke. Acute neuro deficit. Left-sided weakness. EXAM: CT HEAD WITHOUT CONTRAST TECHNIQUE: Contiguous axial images were obtained from the base of the skull through the vertex without intravenous contrast. COMPARISON:  CT head and MRI head 09/05/2020 FINDINGS: Brain: Acute hemorrhage in the left temporal and parietal lobe with surrounding edema. Some of this appears parenchymal blood in some appears to be within the sylvian fissure. In addition, there is left-sided subdural hematoma around the left temporal lobe extending into the left parietal lobe measuring up to 5 mm. There is acute subdural along the left tentorium. There is a smaller 2 x 3 cm hematoma in the right frontal convexity. No ventricular hemorrhage or hydrocephalus. Mild midline shift to the right due to mass-effect. Moderate atrophy. Chronic infarct right temporal lobe. Chronic microvascular ischemic change in the white matter. 1 cm hyperdense lesion right frontal para falcine region compatible with meningioma unchanged from prior CT and MRI. Vascular: Negative for hyperdense vessel. Skull: Negative Sinuses/Orbits:  Mild mucosal edema paranasal sinuses. Bilateral cataract extraction Other: None ASPECTS ([place] Stroke Program Early CT Score) Not calculated due to multiple areas of acute hemorrhage. IMPRESSION: 1. Large area of hemorrhage in the left temporoparietal lobe which appears parenchymal and subarachnoid. There is a moderately large left-sided subdural hematoma and left tentorial subdural hematoma. 2 x 3 cm hematoma right frontal lobe. Mild midline shift to the right. Differential diagnosis includes trauma, coagulopathy, amyloid, and hypertension. Review of the prior MRI does reveal numerous areas of chronic microhemorrhage in the brain which could be due to amyloid or hypertension. Chronic left-sided subdural hematoma noted on the prior CT and MRI. 2. These results were called by telephone at the time of interpretation on 01/15/21 at 3:58 pm to provider Rory Percy , who verbally acknowledged these results. Electronically Signed   By: Franchot Gallo M.D.   On: 15-Jan-2021 15:59   Microbiology: Recent Results (from the past 240 hour(s))  Resp Panel by RT-PCR (Flu A&B, Covid) Nasopharyngeal Swab     Status: None   Collection Time: 15-Jan-2021  7:23 PM   Specimen: Nasopharyngeal Swab; Nasopharyngeal(NP) swabs in vial transport medium  Result Value Ref Range Status   SARS Coronavirus 2 by RT PCR NEGATIVE NEGATIVE Final    Comment: (NOTE) SARS-CoV-2 target nucleic acids are NOT DETECTED.  The SARS-CoV-2 RNA is generally detectable in upper  respiratory specimens during the acute phase of infection. The lowest concentration of SARS-CoV-2 viral copies this assay can detect is 138 copies/mL. A negative result does not preclude SARS-Cov-2 infection and should not be used as the sole basis for treatment or other patient management decisions. A negative result may occur with  improper specimen collection/handling, submission of specimen other than nasopharyngeal swab, presence of viral mutation(s) within the areas  targeted by this assay, and inadequate number of viral copies(<138 copies/mL). A negative result must be combined with clinical observations, patient history, and epidemiological information. The expected result is Negative.  Fact Sheet for Patients:  EntrepreneurPulse.com.au  Fact Sheet for Healthcare Providers:  IncredibleEmployment.be  This test is no t yet approved or cleared by the [place] FDA and  has been authorized for detection and/or diagnosis of SARS-CoV-2 by FDA under an Emergency Use Authorization (EUA). This EUA will remain  in effect (meaning this test can be used) for the duration of the COVID-19 declaration under Section 564(b)(1) of the Act, 21 U.S.C.section 360bbb-3(b)(1), unless the authorization is terminated  or revoked sooner.       Influenza A by PCR NEGATIVE NEGATIVE Final   Influenza B by PCR NEGATIVE NEGATIVE Final    Comment: (NOTE) The Xpert Xpress SARS-CoV-2/FLU/RSV plus assay is intended as an aid in the diagnosis of influenza from Nasopharyngeal swab specimens and should not be used as a sole basis for treatment. Nasal washings and aspirates are unacceptable for Xpert Xpress SARS-CoV-2/FLU/RSV testing.  Fact Sheet for Patients: EntrepreneurPulse.com.au  Fact Sheet for Healthcare Providers: IncredibleEmployment.be  This test is not yet approved or cleared by the [place] FDA and has been authorized for detection and/or diagnosis of SARS-CoV-2 by FDA under an Emergency Use Authorization (EUA). This EUA will remain in effect (meaning this test can be used) for the duration of the COVID-19 declaration under Section 564(b)(1) of the Act, 21 U.S.C. section 360bbb-3(b)(1), unless the authorization is terminated or revoked.  Performed at [facility], [place] [address] [place], [place] [address]    Labs: CBC: Recent Labs  Lab 12/26/2020 1537 12/23/2020 1545   WBC 9.3  --   NEUTROABS 5.8  --   HGB 12.0 12.2  HCT 38.9 36.0  MCV 82.9  --   PLT 212  --    Basic Metabolic Panel: Recent Labs  Lab 01/18/2021 1537 01/07/2021 1545  NA 143 143  K 3.4* 3.4*  CL 109 108  CO2 25  --   GLUCOSE 146* 143*  BUN 17 19  CREATININE 1.20* 1.10*  CALCIUM 8.7*  --    Liver Function Tests: Recent Labs  Lab 01/14/2021 1537  AST 20  ALT 17  ALKPHOS 66  BILITOT 0.8  PROT 7.1  ALBUMIN 3.4*   CBG: No results for input(s): GLUCAP in the last 168 hours.  Time spent: 35 minutes  Signed:  Berle Mull  [facility]  Feb 04, 2021 4:07 PM

## 2021-01-22 NOTE — Progress Notes (Signed)
Patient noted with increase oral secretions upon arriving to unit. Oral suction provided along with prn robinul. Patient noted more comfortable and able to rest post oral suction and medication. Family declined reposition at this time and understand to notify nursing staffs if pt is in distress and additional needs.   POC: Cont to provide comfort to patient and emotional support to family as needed.

## 2021-01-22 NOTE — Progress Notes (Addendum)
Manufacturing engineer [facility]) Hospital Liaison note.    Addendum:  Understand that pt has a bed today for inpatient hospice with [facility].  [facility] will sign off at this time.   Received request from [place] for family interest in [facility]. Spoke with daughter [name] to confirm interest.  Chart and pt information under review by [facility] physician.  Hospice eligibility pending at this time.  [facility] is unable to offer a room today. Hospital Liaison will follow up tomorrow or sooner if a room becomes available. Please do not hesitate to call with questions.    Thank you for the opportunity to participate in this patient's care.  Domenic Moras, BSN, RN [facility] Liaison (listed on [place] under [facility]ospice[facility])    [phone] (24h on call)

## 2021-01-22 NOTE — Progress Notes (Signed)
Daily Progress Note   Patient Name: [name]       Date: Jan 17, 2021 DOB: [birth year]  Age: 87 y.o. MRN#: [id number] Attending Physician: Lavina Hamman, MD Primary Care Physician: Marin Olp, MD Admit Date: 01/03/2021  Reason for Consultation/Follow-up: Hospice Evaluation, Psychosocial/spiritual support and Terminal Care  Subjective: Medical records reviewed. Assessed patient at the bedside and met with her daughter [name] and grandson Hart Carwin. Patient is resting comfortable and does not appear to be in pain or distress.  [name] agrees that patient is currently comfortable and informs me that she also has rested well throughout the night. We reviewed interval history including excessive secretions and adequate management with PRN Robinul and oral suction. Educated on the frequency of Robinul available for request and counseled on the risks and benefits of scheduling Robinul. [name] prefers to continue PRN doses at this time.   Discussed the natural trajectories and expectations at end of life. [name] is tearful and reflective of the wonderful person her mother has always been. She notes the difficulty of this phase while expressing her great appreciation for support from staff throughout patient's hospitalizations. We then reviewed the plan for referral to [facility] and she confirms agreement.   Questions and concerns addressed. "Gone From My Sight" booklet provided for review. Encouraged family to call PMT with any additional needs.   Length of Stay: 1   PRN Meds: acetaminophen **OR** acetaminophen, antiseptic oral rinse, glycopyrrolate **OR** glycopyrrolate **OR** glycopyrrolate, haloperidol **OR** haloperidol **OR** haloperidol lactate, LORazepam **OR** LORazepam **OR** LORazepam,  morphine injection, ondansetron **OR** ondansetron (ZOFRAN) IV, polyvinyl alcohol  Physical Exam Vitals and nursing note reviewed.  Constitutional:      General: She is sleeping.     Appearance: She is cachectic.  Cardiovascular:     Rate and Rhythm: Normal rate.  Pulmonary:     Effort: Pulmonary effort is normal.     Comments: Mouth breathing. Neurological:     Mental Status: She is unresponsive.             Vital Signs: BP (!) 184/94 (BP Location: Left Arm)   Pulse 83   Temp (!) 100.6 F (38.1 C) (Oral)   Resp 16   SpO2 96%  SpO2: SpO2: 96 % O2 Device: O2 Device: Room Air O2 Flow Rate:  Intake/output summary:   Intake/Output Summary (Last 24 hours) at January 23, 2021 1047 Last data filed at Jan 23, 2021 0116 Gross per 24 hour  Intake --  Output 200 ml  Net -200 ml        Palliative Assessment/Data: 10%     Patient Active Problem List   Diagnosis Date Noted  . Subarachnoid hemorrhage 2021/01/23  . Intracranial bleeding ([place]) 01/05/2021  . DNR (do not resuscitate) 01/04/2021  . Hypokalemia 01/04/2021  . Malignant hypertension 01/13/2021  . Acute blood loss anemia   . Pain   . Unstageable decubitus ulcer ([place])   . Labile blood pressure   . Pressure injury of skin 08/28/2020  . Leukocytosis   . Hypoalbuminemia due to protein-calorie malnutrition ([place])   . Essential hypertension   . Dyslipidemia   . Transaminitis   . Closed displaced fracture of head of right radius   . Traumatic subdural hematoma ([place]) 08/26/2020  . LFT elevation   . Disorientation 08/17/2020  . SAH (subarachnoid hemorrhage) ([place]) 08/13/2020  . Subdural hematoma ([place]) 08/12/2020  . Fall at home, initial encounter 08/12/2020  . Subarachnoid hemorrhage following injury ([place]) 08/12/2020  . Macular degeneration 04/16/2020  . Exudative age-related macular degeneration of left eye with active choroidal neovascularization ([place]) 04/02/2020  . Intermediate stage nonexudative age-related macular  degeneration of both eyes 04/02/2020  . Chronic kidney disease, stage 3a ([place]) 08/01/2018  . Meningioma ([place]) 03/31/2018  . History of CVA in adulthood 03/31/2018  . Mixed hyperlipidemia 03/15/2018  . Urinary incontinence 04/28/2017  . Reactive depression 08/14/2015  . Intention tremor 06/21/2015  . Cerebral amyloid angiopathy ([place]) 05/29/2015  . Atrial fibrillation, chronic ([place]) 03/29/2015  . Altered mental status   . History of intracerebral hemorrhage    . Dizzy spells 11/10/2014  . SPINAL STENOSIS, CERVICAL 06/11/2010  . Hypertensive urgency 06/22/2006  . Arthritis of right knee 06/22/2006  . Osteopenia 06/22/2006    Palliative Care Assessment & Plan   Per Elie Confer NP note->Patient Profile: 87 y.o. female  with past medical history of previous ICH, hypertension, hyperlipidemia, and macular degeneration presenting to the emergency department on 12/26/2020 as a code stroke. Reportedly was sweeping her apartment and slid to the ground. [facility] was called and arrived to find patient with unequal pupils, right facial droop, left gaze, aphasic, and no movement on the right side.  CT head results: large area of hemorrhage in the left temporoparietal lobe which appears parenchymal and subarachnoid. There is a moderately large left-sided subdural hematoma and left tentorial subdural hematoma. 2 x 3 cm hematoma right frontal lobe. Mild midline shift to the right.   Of note--history of previous ICH secondary to fall, hospitalized 08/11/20--08/16/20. During that admission, CT head showed SAH in the sulcus of left frontal lobe as well as SDH over the left frontoparietal convexity measuring 4 cm in maximal thickness. Neurosurgery recommended conservative management. She was discharged home but had persistent sleepiness and lethargy and was brought back to the ER, then admitted 08/17/20--08/26/20. Discharged to [facility] 08/26/20.   Assessment: End of life care Intracranial hemorrhage  Chronic atrial  fibrillation  Recommendations/Plan:  Continue current PRN medications for comfort  Daughter [name] confirms desire for referral to residential hospice, prefers [facility] and emotional support provided  PMT will continue to support holistically  Goals of Care and Additional Recommendations:  Limitations on Scope of Treatment: Full Comfort Care  Prognosis:   Hours - Days  Discharge Planning:  Hospice facility  Care plan was discussed  with patient's daughter and grandson, Magdalen Spatz CM  Total time: 15 minutes Greater than 50% of this time was spent in counseling and coordinating care related to the above assessment and plan.  Dorthy Cooler, PA-C Palliative Medicine Team Team phone # [phone]  Thank you for allowing the Palliative Medicine Team to assist in the care of this patient. Please utilize secure chat with additional questions, if there is no response within 30 minutes please call the above phone number.  Palliative Medicine Team providers are available by phone from 7am to 7pm daily and can be reached through the team cell phone.  Should this patient require assistance outside of these hours, please call the patient's attending physician.

## 2021-01-22 NOTE — Progress Notes (Signed)
Image and notes reviewed. Agree with comfort care measure at this time. Discussed with Dr. Posey Pronto, neuro sign off at this time. Please call with any questions.   [name], MD PhD Stroke Neurology 01/21/2021 12:43 PM

## 2021-01-22 DEATH — deceased

## 2021-01-28 ENCOUNTER — Encounter (INDEPENDENT_AMBULATORY_CARE_PROVIDER_SITE_OTHER): Payer: Medicare Other | Admitting: Ophthalmology

## 2021-02-21 NOTE — Death Summary Note (Signed)
[facility] Death Summary   Patient: [name] IRS:[id number]   PCP: Marin Olp, MD DOB: 03-09-1918    Admit Date:  Jan 25, 2021  Date of Death: Date of Death: Jan 26, 2021  Time of Death: Time of Death: 1953  Length of Stay: 1   Hospital Diagnoses:  Principle Cause of death bilateral intraparenchymal, subarachnoid as well as subdural hematoma due to cerebral amyloid angiopathy.  Principal Problem:   Intracranial bleeding (HCC) Active Problems:   Atrial fibrillation, chronic (HCC)   Cerebral amyloid angiopathy (HCC)   Intention tremor   Chronic kidney disease, stage 3a ([place])   Fall at home, initial encounter   DNR (do not resuscitate)   Hypokalemia   Malignant hypertension   Subarachnoid hemorrhage   End of life care   History of present illness: As per the H and P dictated on admission, "[name] is a 87 y.o. female with Past medical history of ICH, HTN, HLD, macular degeneration, cerebral amyloid angiopathy. Patient presented to the hospital with complaints of fall and confusion. Reported patient had a sudden onset of weakness.  She slid down to the ground from chair.  EMS was called.  Pupils were unequal and had a gaze preference and therefore code stroke was called.  Further information is still not available as the patient is nonverbal. The patient similar event in the past relating to [place] with complete recovery per family. No nausea no vomiting.  No diarrhea no constipation.  No recent change in medication reported by the family."  Hospital Course:  Intracranial bleeding [facility]) Presented as a code stroke. CT scan shows bilateral intraparenchymal, subarachnoid as well as subdural hematoma. This is likely in response to her prior history of cerebral amyloid angiopathy. patient had significant midline shift as well. Given that her prognosis is poor and surgical management is not an option given her advanced age and ongoing recurrent bleed history family  decided to opt for comfort care. Palliative care consulted. hospice on discharge. Pt passed away in hospital prior to get transferred to the facility.   Malignant hypertension Presents with intracranial hemorrhage. Currently comfort care. Was on Cleviprex drip. Now comfort care.  Atrial fibrillation, chronic (HCC) Not on anticoagulation due to recurrent bleeding. No further work-up. Comfort care.  Chronic kidney disease, stage 3a ([place]) Renal function appears baseline.  DNR (do not resuscitate) Present on admission.  Hypokalemia Mild. No further work-up or Comfort care. Body mass index is 19.3 kg/m.   Malnutrition Type:   Nutrition Interventions:    Pressure Injury 08/26/20 Breast Lateral;Left;Lower Unstageable - Full thickness tissue loss in which the base of the injury is covered by slough (yellow, tan, gray, green or brown) and/or eschar (tan, brown or black) in the wound bed. (Active)  08/26/20 1430  Location: Breast  Location Orientation: Lateral;Left;Lower  Staging: Unstageable - Full thickness tissue loss in which the base of the injury is covered by slough (yellow, tan, gray, green or brown) and/or eschar (tan, brown or black) in the wound bed.  Wound Description (Comments):   Present on Admission: Yes     Pressure Injury 08/26/20 Arm Anterior;Left;Proximal;Upper Stage 2 -  Partial thickness loss of dermis presenting as a shallow open injury with a red, pink wound bed without slough. (Active)  08/26/20 1430  Location: Arm  Location Orientation: Anterior;Left;Proximal;Upper  Staging: Stage 2 -  Partial thickness loss of dermis presenting as a shallow open injury with a red, pink wound bed without slough.  Wound Description (Comments):  Present on Admission: Yes    The patient was pronounced deceased at 19:53, on 23-Jan-2021.  Procedures and Results:  none   Consultations:  Neurology   Palliative care   The results of significant diagnostics  from this hospitalization (including imaging, microbiology, ancillary and laboratory) are listed below for reference.    Significant Diagnostic Studies: CT HEAD CODE STROKE WO CONTRAST  Result Date: 01/15/2021 CLINICAL DATA:  Code stroke. Acute neuro deficit. Left-sided weakness. EXAM: CT HEAD WITHOUT CONTRAST TECHNIQUE: Contiguous axial images were obtained from the base of the skull through the vertex without intravenous contrast. COMPARISON:  CT head and MRI head 09/05/2020 FINDINGS: Brain: Acute hemorrhage in the left temporal and parietal lobe with surrounding edema. Some of this appears parenchymal blood in some appears to be within the sylvian fissure. In addition, there is left-sided subdural hematoma around the left temporal lobe extending into the left parietal lobe measuring up to 5 mm. There is acute subdural along the left tentorium. There is a smaller 2 x 3 cm hematoma in the right frontal convexity. No ventricular hemorrhage or hydrocephalus. Mild midline shift to the right due to mass-effect. Moderate atrophy. Chronic infarct right temporal lobe. Chronic microvascular ischemic change in the white matter. 1 cm hyperdense lesion right frontal para falcine region compatible with meningioma unchanged from prior CT and MRI. Vascular: Negative for hyperdense vessel. Skull: Negative Sinuses/Orbits: Mild mucosal edema paranasal sinuses. Bilateral cataract extraction Other: None ASPECTS ([place] Stroke Program Early CT Score) Not calculated due to multiple areas of acute hemorrhage. IMPRESSION: 1. Large area of hemorrhage in the left temporoparietal lobe which appears parenchymal and subarachnoid. There is a moderately large left-sided subdural hematoma and left tentorial subdural hematoma. 2 x 3 cm hematoma right frontal lobe. Mild midline shift to the right. Differential diagnosis includes trauma, coagulopathy, amyloid, and hypertension. Review of the prior MRI does reveal numerous areas of chronic  microhemorrhage in the brain which could be due to amyloid or hypertension. Chronic left-sided subdural hematoma noted on the prior CT and MRI. 2. These results were called by telephone at the time of interpretation on 12/30/2020 at 3:58 pm to provider Rory Percy , who verbally acknowledged these results. Electronically Signed   By: Franchot Gallo M.D.   On: 01/12/2021 15:59    Microbiology: No results found for this or any previous visit (from the past 240 hour(s)).   Labs: CBC: No results for input(s): WBC, NEUTROABS, HGB, HCT, MCV, PLT in the last 168 hours. Basic Metabolic Panel: No results for input(s): NA, K, CL, CO2, GLUCOSE, BUN, CREATININE, CALCIUM, MG, PHOS in the last 168 hours. Liver Function Tests: No results for input(s): AST, ALT, ALKPHOS, BILITOT, PROT, ALBUMIN in the last 168 hours. Cardiac Enzymes: No results for input(s): CKTOTAL, CKMB, CKMBINDEX, TROPONINI in the last 168 hours.  Time spent: 30 minutes  Signed:  Berle Mull  [facility]  2021-01-23
